# Patient Record
Sex: Male | Born: 1965 | Race: White | Hispanic: No | Smoking: Current every day smoker
Health system: Southern US, Community
[De-identification: ages and names within clinical notes are randomized; demographics above are authoritative.]

## PROBLEM LIST (undated history)

## (undated) DIAGNOSIS — M869 Osteomyelitis, unspecified: Secondary | ICD-10-CM

## (undated) DIAGNOSIS — K279 Peptic ulcer, site unspecified, unspecified as acute or chronic, without hemorrhage or perforation: Secondary | ICD-10-CM

## (undated) DIAGNOSIS — K269 Duodenal ulcer, unspecified as acute or chronic, without hemorrhage or perforation: Secondary | ICD-10-CM

## (undated) DIAGNOSIS — I739 Peripheral vascular disease, unspecified: Secondary | ICD-10-CM

## (undated) DIAGNOSIS — I5041 Acute combined systolic (congestive) and diastolic (congestive) heart failure: Secondary | ICD-10-CM

## (undated) DIAGNOSIS — K259 Gastric ulcer, unspecified as acute or chronic, without hemorrhage or perforation: Secondary | ICD-10-CM

## (undated) DIAGNOSIS — J449 Chronic obstructive pulmonary disease, unspecified: Secondary | ICD-10-CM

## (undated) DIAGNOSIS — I951 Orthostatic hypotension: Secondary | ICD-10-CM

## (undated) DIAGNOSIS — E119 Type 2 diabetes mellitus without complications: Secondary | ICD-10-CM

## (undated) DIAGNOSIS — I10 Essential (primary) hypertension: Secondary | ICD-10-CM

## (undated) DIAGNOSIS — I509 Heart failure, unspecified: Secondary | ICD-10-CM

## (undated) DIAGNOSIS — N183 Chronic kidney disease, stage 3 unspecified: Secondary | ICD-10-CM

## (undated) DIAGNOSIS — Z720 Tobacco use: Secondary | ICD-10-CM

## (undated) DIAGNOSIS — E785 Hyperlipidemia, unspecified: Secondary | ICD-10-CM

## (undated) DIAGNOSIS — D649 Anemia, unspecified: Secondary | ICD-10-CM

## (undated) HISTORY — DX: Chronic kidney disease, stage 3 unspecified: N18.30

## (undated) HISTORY — DX: Acute combined systolic (congestive) and diastolic (congestive) heart failure: I50.41

## (undated) HISTORY — DX: Peptic ulcer, site unspecified, unspecified as acute or chronic, without hemorrhage or perforation: K27.9

## (undated) HISTORY — DX: Heart failure, unspecified: I50.9

## (undated) HISTORY — DX: Duodenal ulcer, unspecified as acute or chronic, without hemorrhage or perforation: K26.9

## (undated) HISTORY — DX: Orthostatic hypotension: I95.1

## (undated) HISTORY — DX: Anemia, unspecified: D64.9

## (undated) HISTORY — DX: Peripheral vascular disease, unspecified: I73.9

## (undated) HISTORY — DX: Gastric ulcer, unspecified as acute or chronic, without hemorrhage or perforation: K25.9

## (undated) HISTORY — DX: Osteomyelitis, unspecified: M86.9

---

## 2016-12-05 DIAGNOSIS — Z720 Tobacco use: Secondary | ICD-10-CM

## 2016-12-05 DIAGNOSIS — E1165 Type 2 diabetes mellitus with hyperglycemia: Secondary | ICD-10-CM

## 2016-12-05 DIAGNOSIS — N19 Unspecified kidney failure: Secondary | ICD-10-CM

## 2016-12-05 DIAGNOSIS — R55 Syncope and collapse: Secondary | ICD-10-CM

## 2016-12-05 DIAGNOSIS — I959 Hypotension, unspecified: Secondary | ICD-10-CM

## 2016-12-06 DIAGNOSIS — I10 Essential (primary) hypertension: Secondary | ICD-10-CM

## 2016-12-06 DIAGNOSIS — N179 Acute kidney failure, unspecified: Secondary | ICD-10-CM

## 2016-12-14 DIAGNOSIS — D649 Anemia, unspecified: Secondary | ICD-10-CM | POA: Insufficient documentation

## 2016-12-20 DIAGNOSIS — R55 Syncope and collapse: Secondary | ICD-10-CM | POA: Insufficient documentation

## 2016-12-20 DIAGNOSIS — I951 Orthostatic hypotension: Secondary | ICD-10-CM | POA: Insufficient documentation

## 2016-12-20 DIAGNOSIS — R079 Chest pain, unspecified: Secondary | ICD-10-CM | POA: Insufficient documentation

## 2016-12-20 HISTORY — DX: Orthostatic hypotension: I95.1

## 2017-05-17 DIAGNOSIS — N529 Male erectile dysfunction, unspecified: Secondary | ICD-10-CM | POA: Insufficient documentation

## 2018-03-18 DIAGNOSIS — F17200 Nicotine dependence, unspecified, uncomplicated: Secondary | ICD-10-CM | POA: Insufficient documentation

## 2018-06-24 DIAGNOSIS — E1165 Type 2 diabetes mellitus with hyperglycemia: Secondary | ICD-10-CM

## 2018-06-24 DIAGNOSIS — Z720 Tobacco use: Secondary | ICD-10-CM

## 2018-06-24 DIAGNOSIS — R739 Hyperglycemia, unspecified: Secondary | ICD-10-CM

## 2018-06-24 DIAGNOSIS — N492 Inflammatory disorders of scrotum: Secondary | ICD-10-CM

## 2018-06-24 DIAGNOSIS — K651 Peritoneal abscess: Secondary | ICD-10-CM

## 2018-07-09 DIAGNOSIS — E1165 Type 2 diabetes mellitus with hyperglycemia: Secondary | ICD-10-CM | POA: Insufficient documentation

## 2018-07-17 DIAGNOSIS — L02215 Cutaneous abscess of perineum: Secondary | ICD-10-CM | POA: Insufficient documentation

## 2020-05-22 DIAGNOSIS — N289 Disorder of kidney and ureter, unspecified: Secondary | ICD-10-CM | POA: Insufficient documentation

## 2020-05-22 DIAGNOSIS — F17210 Nicotine dependence, cigarettes, uncomplicated: Secondary | ICD-10-CM | POA: Insufficient documentation

## 2020-05-22 DIAGNOSIS — Z951 Presence of aortocoronary bypass graft: Secondary | ICD-10-CM | POA: Insufficient documentation

## 2020-05-22 DIAGNOSIS — Z9181 History of falling: Secondary | ICD-10-CM | POA: Insufficient documentation

## 2020-05-22 DIAGNOSIS — Z7984 Long term (current) use of oral hypoglycemic drugs: Secondary | ICD-10-CM | POA: Insufficient documentation

## 2021-06-04 DIAGNOSIS — E785 Hyperlipidemia, unspecified: Secondary | ICD-10-CM | POA: Diagnosis not present

## 2021-06-04 DIAGNOSIS — I340 Nonrheumatic mitral (valve) insufficiency: Secondary | ICD-10-CM | POA: Diagnosis not present

## 2021-06-04 DIAGNOSIS — I5021 Acute systolic (congestive) heart failure: Secondary | ICD-10-CM | POA: Diagnosis not present

## 2021-06-04 DIAGNOSIS — I119 Hypertensive heart disease without heart failure: Secondary | ICD-10-CM | POA: Diagnosis not present

## 2021-06-04 DIAGNOSIS — E1165 Type 2 diabetes mellitus with hyperglycemia: Secondary | ICD-10-CM

## 2021-06-04 DIAGNOSIS — I420 Dilated cardiomyopathy: Secondary | ICD-10-CM | POA: Diagnosis not present

## 2021-06-04 DIAGNOSIS — I361 Nonrheumatic tricuspid (valve) insufficiency: Secondary | ICD-10-CM | POA: Diagnosis not present

## 2021-06-05 DIAGNOSIS — I5021 Acute systolic (congestive) heart failure: Secondary | ICD-10-CM | POA: Diagnosis not present

## 2021-06-05 DIAGNOSIS — I119 Hypertensive heart disease without heart failure: Secondary | ICD-10-CM | POA: Diagnosis not present

## 2021-06-05 DIAGNOSIS — E785 Hyperlipidemia, unspecified: Secondary | ICD-10-CM | POA: Diagnosis not present

## 2021-06-05 DIAGNOSIS — I420 Dilated cardiomyopathy: Secondary | ICD-10-CM | POA: Diagnosis not present

## 2021-06-06 ENCOUNTER — Encounter (HOSPITAL_COMMUNITY): Payer: Self-pay | Admitting: Internal Medicine

## 2021-06-06 ENCOUNTER — Inpatient Hospital Stay (HOSPITAL_COMMUNITY): Payer: BC Managed Care – PPO

## 2021-06-06 ENCOUNTER — Inpatient Hospital Stay (HOSPITAL_COMMUNITY)
Admit: 2021-06-06 | Discharge: 2021-06-18 | DRG: 233 | Disposition: A | Discharge status: Home / Self Care | Payer: BC Managed Care – PPO | Source: Other Acute Inpatient Hospital | Attending: Cardiothoracic Surgery | Admitting: Cardiothoracic Surgery

## 2021-06-06 ENCOUNTER — Encounter (HOSPITAL_COMMUNITY)
Disposition: A | Discharge status: Home / Self Care | Payer: Self-pay | Source: Other Acute Inpatient Hospital | Attending: Internal Medicine

## 2021-06-06 DIAGNOSIS — Z01810 Encounter for preprocedural cardiovascular examination: Secondary | ICD-10-CM | POA: Diagnosis not present

## 2021-06-06 DIAGNOSIS — I10 Essential (primary) hypertension: Secondary | ICD-10-CM

## 2021-06-06 DIAGNOSIS — Z794 Long term (current) use of insulin: Secondary | ICD-10-CM | POA: Diagnosis not present

## 2021-06-06 DIAGNOSIS — D689 Coagulation defect, unspecified: Secondary | ICD-10-CM | POA: Diagnosis not present

## 2021-06-06 DIAGNOSIS — K5900 Constipation, unspecified: Secondary | ICD-10-CM | POA: Diagnosis not present

## 2021-06-06 DIAGNOSIS — I5021 Acute systolic (congestive) heart failure: Principal | ICD-10-CM | POA: Diagnosis present

## 2021-06-06 DIAGNOSIS — Z9889 Other specified postprocedural states: Secondary | ICD-10-CM

## 2021-06-06 DIAGNOSIS — I420 Dilated cardiomyopathy: Secondary | ICD-10-CM | POA: Diagnosis not present

## 2021-06-06 DIAGNOSIS — I493 Ventricular premature depolarization: Secondary | ICD-10-CM | POA: Diagnosis not present

## 2021-06-06 DIAGNOSIS — I2510 Atherosclerotic heart disease of native coronary artery without angina pectoris: Principal | ICD-10-CM | POA: Diagnosis present

## 2021-06-06 DIAGNOSIS — N179 Acute kidney failure, unspecified: Secondary | ICD-10-CM | POA: Diagnosis not present

## 2021-06-06 DIAGNOSIS — E785 Hyperlipidemia, unspecified: Secondary | ICD-10-CM | POA: Diagnosis not present

## 2021-06-06 DIAGNOSIS — Z419 Encounter for procedure for purposes other than remedying health state, unspecified: Secondary | ICD-10-CM

## 2021-06-06 DIAGNOSIS — I110 Hypertensive heart disease with heart failure: Secondary | ICD-10-CM | POA: Diagnosis present

## 2021-06-06 DIAGNOSIS — Z8249 Family history of ischemic heart disease and other diseases of the circulatory system: Secondary | ICD-10-CM | POA: Diagnosis not present

## 2021-06-06 DIAGNOSIS — I255 Ischemic cardiomyopathy: Secondary | ICD-10-CM | POA: Diagnosis present

## 2021-06-06 DIAGNOSIS — D62 Acute posthemorrhagic anemia: Secondary | ICD-10-CM | POA: Diagnosis not present

## 2021-06-06 DIAGNOSIS — E1165 Type 2 diabetes mellitus with hyperglycemia: Secondary | ICD-10-CM | POA: Diagnosis not present

## 2021-06-06 DIAGNOSIS — E871 Hypo-osmolality and hyponatremia: Secondary | ICD-10-CM | POA: Diagnosis not present

## 2021-06-06 DIAGNOSIS — J449 Chronic obstructive pulmonary disease, unspecified: Secondary | ICD-10-CM | POA: Diagnosis present

## 2021-06-06 DIAGNOSIS — Z20822 Contact with and (suspected) exposure to covid-19: Secondary | ICD-10-CM | POA: Diagnosis not present

## 2021-06-06 DIAGNOSIS — Z951 Presence of aortocoronary bypass graft: Secondary | ICD-10-CM

## 2021-06-06 DIAGNOSIS — J9811 Atelectasis: Secondary | ICD-10-CM | POA: Diagnosis not present

## 2021-06-06 DIAGNOSIS — Z79899 Other long term (current) drug therapy: Secondary | ICD-10-CM

## 2021-06-06 DIAGNOSIS — D6959 Other secondary thrombocytopenia: Secondary | ICD-10-CM | POA: Diagnosis not present

## 2021-06-06 DIAGNOSIS — I119 Hypertensive heart disease without heart failure: Secondary | ICD-10-CM | POA: Diagnosis not present

## 2021-06-06 DIAGNOSIS — E119 Type 2 diabetes mellitus without complications: Secondary | ICD-10-CM | POA: Diagnosis not present

## 2021-06-06 DIAGNOSIS — F17210 Nicotine dependence, cigarettes, uncomplicated: Secondary | ICD-10-CM | POA: Diagnosis present

## 2021-06-06 DIAGNOSIS — I509 Heart failure, unspecified: Secondary | ICD-10-CM

## 2021-06-06 DIAGNOSIS — Z833 Family history of diabetes mellitus: Secondary | ICD-10-CM | POA: Diagnosis not present

## 2021-06-06 DIAGNOSIS — K869 Disease of pancreas, unspecified: Secondary | ICD-10-CM | POA: Diagnosis present

## 2021-06-06 HISTORY — DX: Tobacco use: Z72.0

## 2021-06-06 HISTORY — DX: Chronic obstructive pulmonary disease, unspecified: J44.9

## 2021-06-06 HISTORY — DX: Hyperlipidemia, unspecified: E78.5

## 2021-06-06 HISTORY — PX: RIGHT/LEFT HEART CATH AND CORONARY ANGIOGRAPHY: CATH118266

## 2021-06-06 HISTORY — DX: Type 2 diabetes mellitus without complications: E11.9

## 2021-06-06 HISTORY — DX: Acute systolic (congestive) heart failure: I50.21

## 2021-06-06 HISTORY — DX: Essential (primary) hypertension: I10

## 2021-06-06 LAB — ECHOCARDIOGRAM COMPLETE
AR max vel: 1.43 cm2
AV Area VTI: 1.51 cm2
AV Area mean vel: 1.4 cm2
AV Mean grad: 4 mmHg
AV Peak grad: 7.6 mmHg
Ao pk vel: 1.38 m/s
Area-P 1/2: 3.95 cm2
Calc EF: 36.3 %
Height: 68 in
S' Lateral: 4.7 cm
Single Plane A2C EF: 46 %
Single Plane A4C EF: 21.8 %
Weight: 2747.81 oz

## 2021-06-06 LAB — CBC WITH DIFFERENTIAL/PLATELET
Abs Immature Granulocytes: 0.03 10*3/uL (ref 0.00–0.07)
Basophils Absolute: 0.1 10*3/uL (ref 0.0–0.1)
Basophils Relative: 1 %
Eosinophils Absolute: 0.1 10*3/uL (ref 0.0–0.5)
Eosinophils Relative: 2 %
HCT: 47.1 % (ref 39.0–52.0)
Hemoglobin: 15.7 g/dL (ref 13.0–17.0)
Immature Granulocytes: 0 %
Lymphocytes Relative: 22 %
Lymphs Abs: 1.9 10*3/uL (ref 0.7–4.0)
MCH: 28.3 pg (ref 26.0–34.0)
MCHC: 33.3 g/dL (ref 30.0–36.0)
MCV: 85 fL (ref 80.0–100.0)
Monocytes Absolute: 0.7 10*3/uL (ref 0.1–1.0)
Monocytes Relative: 8 %
Neutro Abs: 5.7 10*3/uL (ref 1.7–7.7)
Neutrophils Relative %: 67 %
Platelets: 341 10*3/uL (ref 150–400)
RBC: 5.54 MIL/uL (ref 4.22–5.81)
RDW: 13.2 % (ref 11.5–15.5)
WBC: 8.4 10*3/uL (ref 4.0–10.5)
nRBC: 0 % (ref 0.0–0.2)

## 2021-06-06 LAB — POCT I-STAT 7, (LYTES, BLD GAS, ICA,H+H)
Acid-Base Excess: 3 mmol/L — ABNORMAL HIGH (ref 0.0–2.0)
Bicarbonate: 28 mmol/L (ref 20.0–28.0)
Calcium, Ion: 1.17 mmol/L (ref 1.15–1.40)
HCT: 43 % (ref 39.0–52.0)
Hemoglobin: 14.6 g/dL (ref 13.0–17.0)
O2 Saturation: 97 %
Potassium: 4 mmol/L (ref 3.5–5.1)
Sodium: 139 mmol/L (ref 135–145)
TCO2: 29 mmol/L (ref 22–32)
pCO2 arterial: 45.1 mmHg (ref 32.0–48.0)
pH, Arterial: 7.401 (ref 7.350–7.450)
pO2, Arterial: 95 mmHg (ref 83.0–108.0)

## 2021-06-06 LAB — COMPREHENSIVE METABOLIC PANEL
ALT: 23 U/L (ref 0–44)
AST: 18 U/L (ref 15–41)
Albumin: 3.7 g/dL (ref 3.5–5.0)
Alkaline Phosphatase: 96 U/L (ref 38–126)
Anion gap: 12 (ref 5–15)
BUN: 35 mg/dL — ABNORMAL HIGH (ref 6–20)
CO2: 28 mmol/L (ref 22–32)
Calcium: 10 mg/dL (ref 8.9–10.3)
Chloride: 99 mmol/L (ref 98–111)
Creatinine, Ser: 1.44 mg/dL — ABNORMAL HIGH (ref 0.61–1.24)
GFR, Estimated: 58 mL/min — ABNORMAL LOW (ref >60)
Glucose, Bld: 169 mg/dL — ABNORMAL HIGH (ref 70–99)
Potassium: 4.4 mmol/L (ref 3.5–5.1)
Sodium: 139 mmol/L (ref 135–145)
Total Bilirubin: 0.9 mg/dL (ref 0.3–1.2)
Total Protein: 6.9 g/dL (ref 6.5–8.1)

## 2021-06-06 LAB — POCT I-STAT EG7
Acid-Base Excess: 2 mmol/L (ref 0.0–2.0)
Acid-Base Excess: 3 mmol/L — ABNORMAL HIGH (ref 0.0–2.0)
Acid-Base Excess: 3 mmol/L — ABNORMAL HIGH (ref 0.0–2.0)
Bicarbonate: 28.9 mmol/L — ABNORMAL HIGH (ref 20.0–28.0)
Bicarbonate: 29.2 mmol/L — ABNORMAL HIGH (ref 20.0–28.0)
Bicarbonate: 29.3 mmol/L — ABNORMAL HIGH (ref 20.0–28.0)
Calcium, Ion: 1.19 mmol/L (ref 1.15–1.40)
Calcium, Ion: 1.2 mmol/L (ref 1.15–1.40)
Calcium, Ion: 1.22 mmol/L (ref 1.15–1.40)
HCT: 43 % (ref 39.0–52.0)
HCT: 43 % (ref 39.0–52.0)
HCT: 44 % (ref 39.0–52.0)
Hemoglobin: 14.6 g/dL (ref 13.0–17.0)
Hemoglobin: 14.6 g/dL (ref 13.0–17.0)
Hemoglobin: 15 g/dL (ref 13.0–17.0)
O2 Saturation: 69 %
O2 Saturation: 69 %
O2 Saturation: 71 %
Potassium: 4 mmol/L (ref 3.5–5.1)
Potassium: 4.2 mmol/L (ref 3.5–5.1)
Potassium: 4.2 mmol/L (ref 3.5–5.1)
Sodium: 137 mmol/L (ref 135–145)
Sodium: 138 mmol/L (ref 135–145)
Sodium: 138 mmol/L (ref 135–145)
TCO2: 30 mmol/L (ref 22–32)
TCO2: 31 mmol/L (ref 22–32)
TCO2: 31 mmol/L (ref 22–32)
pCO2, Ven: 50.9 mmHg (ref 44.0–60.0)
pCO2, Ven: 51 mmHg (ref 44.0–60.0)
pCO2, Ven: 51.3 mmHg (ref 44.0–60.0)
pH, Ven: 7.361 (ref 7.250–7.430)
pH, Ven: 7.366 (ref 7.250–7.430)
pH, Ven: 7.366 (ref 7.250–7.430)
pO2, Ven: 38 mmHg (ref 32.0–45.0)
pO2, Ven: 38 mmHg (ref 32.0–45.0)
pO2, Ven: 39 mmHg (ref 32.0–45.0)

## 2021-06-06 LAB — RESP PANEL BY RT-PCR (FLU A&B, COVID) ARPGX2
Influenza A by PCR: NEGATIVE
Influenza B by PCR: NEGATIVE
SARS Coronavirus 2 by RT PCR: NEGATIVE

## 2021-06-06 LAB — GLUCOSE, CAPILLARY: Glucose-Capillary: 177 mg/dL — ABNORMAL HIGH (ref 70–99)

## 2021-06-06 LAB — SURGICAL PCR SCREEN
MRSA, PCR: NEGATIVE
Staphylococcus aureus: POSITIVE — AB

## 2021-06-06 LAB — MAGNESIUM: Magnesium: 2.2 mg/dL (ref 1.7–2.4)

## 2021-06-06 LAB — HIV ANTIBODY (ROUTINE TESTING W REFLEX): HIV Screen 4th Generation wRfx: NONREACTIVE

## 2021-06-06 SURGERY — RIGHT/LEFT HEART CATH AND CORONARY ANGIOGRAPHY
Anesthesia: LOCAL

## 2021-06-06 MED ORDER — FENTANYL CITRATE (PF) 100 MCG/2ML IJ SOLN
INTRAMUSCULAR | Status: DC | PRN
  Administered 2021-06-06: 25 ug via INTRAVENOUS

## 2021-06-06 MED ORDER — VERAPAMIL HCL 2.5 MG/ML IV SOLN
INTRAVENOUS | Status: DC | PRN
  Administered 2021-06-06: 10 mL via INTRA_ARTERIAL

## 2021-06-06 MED ORDER — HEPARIN (PORCINE) IN NACL 1000-0.9 UT/500ML-% IV SOLN
INTRAVENOUS | Status: AC
  Filled 2021-06-06: qty 500

## 2021-06-06 MED ORDER — SODIUM CHLORIDE 0.9% FLUSH
3.0000 mL | Freq: Two times a day (BID) | INTRAVENOUS | Status: DC
  Administered 2021-06-06: 3 mL via INTRAVENOUS

## 2021-06-06 MED ORDER — HEPARIN SODIUM (PORCINE) 1000 UNIT/ML IJ SOLN
INTRAMUSCULAR | Status: DC | PRN
  Administered 2021-06-06: 2000 [IU] via INTRAVENOUS

## 2021-06-06 MED ORDER — ONDANSETRON HCL 4 MG/2ML IJ SOLN: 4.0000 mg | Freq: Four times a day (QID) | INTRAMUSCULAR | Status: DC | PRN

## 2021-06-06 MED ORDER — ACETAMINOPHEN 325 MG PO TABS
650.0000 mg | ORAL_TABLET | ORAL | Status: DC | PRN
  Administered 2021-06-06 – 2021-06-09 (×5): 650 mg via ORAL
  Filled 2021-06-06 (×5): qty 2

## 2021-06-06 MED ORDER — FENTANYL CITRATE (PF) 100 MCG/2ML IJ SOLN
INTRAMUSCULAR | Status: AC
  Filled 2021-06-06: qty 2

## 2021-06-06 MED ORDER — LIDOCAINE HCL (PF) 1 % IJ SOLN
INTRAMUSCULAR | Status: AC
  Filled 2021-06-06: qty 30

## 2021-06-06 MED ORDER — MIDAZOLAM HCL 2 MG/2ML IJ SOLN
INTRAMUSCULAR | Status: DC | PRN
  Administered 2021-06-06: 1 mg via INTRAVENOUS

## 2021-06-06 MED ORDER — HYDRALAZINE HCL 20 MG/ML IJ SOLN: 10.0000 mg | INTRAMUSCULAR | Status: AC | PRN

## 2021-06-06 MED ORDER — SODIUM CHLORIDE 0.9% FLUSH: 3.0000 mL | INTRAVENOUS | Status: DC | PRN

## 2021-06-06 MED ORDER — ENOXAPARIN SODIUM 40 MG/0.4ML IJ SOSY
40.0000 mg | PREFILLED_SYRINGE | INTRAMUSCULAR | Status: DC
  Administered 2021-06-07 – 2021-06-09 (×3): 40 mg via SUBCUTANEOUS
  Filled 2021-06-06 (×3): qty 0.4

## 2021-06-06 MED ORDER — SODIUM CHLORIDE 0.9 % IV SOLN: INTRAVENOUS | Status: AC

## 2021-06-06 MED ORDER — ASPIRIN 81 MG PO CHEW: 81.0000 mg | CHEWABLE_TABLET | ORAL | Status: DC

## 2021-06-06 MED ORDER — SODIUM CHLORIDE 0.9 % IV SOLN: 250.0000 mL | INTRAVENOUS | Status: DC | PRN

## 2021-06-06 MED ORDER — MIDAZOLAM HCL 5 MG/5ML IJ SOLN
INTRAMUSCULAR | Status: AC
  Filled 2021-06-06: qty 5

## 2021-06-06 MED ORDER — ASPIRIN EC 81 MG PO TBEC
81.0000 mg | DELAYED_RELEASE_TABLET | Freq: Every day | ORAL | Status: DC
  Administered 2021-06-07 – 2021-06-09 (×3): 81 mg via ORAL
  Filled 2021-06-06 (×3): qty 1

## 2021-06-06 MED ORDER — SODIUM CHLORIDE 0.9% FLUSH
3.0000 mL | Freq: Two times a day (BID) | INTRAVENOUS | Status: DC
  Administered 2021-06-06 – 2021-06-09 (×7): 3 mL via INTRAVENOUS

## 2021-06-06 MED ORDER — ACETAMINOPHEN 325 MG PO TABS
650.0000 mg | ORAL_TABLET | ORAL | Status: DC | PRN
  Filled 2021-06-06: qty 2

## 2021-06-06 MED ORDER — LABETALOL HCL 5 MG/ML IV SOLN: 10.0000 mg | INTRAVENOUS | Status: AC | PRN

## 2021-06-06 MED ORDER — SODIUM CHLORIDE 0.9 % IV SOLN: INTRAVENOUS | Status: DC

## 2021-06-06 MED ORDER — IOHEXOL 350 MG/ML SOLN
INTRAVENOUS | Status: DC | PRN
  Administered 2021-06-06: 60 mL

## 2021-06-06 MED ORDER — HEPARIN (PORCINE) IN NACL 1000-0.9 UT/500ML-% IV SOLN
INTRAVENOUS | Status: DC | PRN
  Administered 2021-06-06 (×2): 500 mL

## 2021-06-06 MED ORDER — ATORVASTATIN CALCIUM 80 MG PO TABS
80.0000 mg | ORAL_TABLET | Freq: Every day | ORAL | Status: DC
  Administered 2021-06-06 – 2021-06-17 (×12): 80 mg via ORAL
  Filled 2021-06-06 (×12): qty 1

## 2021-06-06 MED ORDER — SODIUM CHLORIDE 0.9% FLUSH
3.0000 mL | Freq: Two times a day (BID) | INTRAVENOUS | Status: DC
  Administered 2021-06-06 – 2021-06-09 (×4): 3 mL via INTRAVENOUS

## 2021-06-06 MED ORDER — LIDOCAINE HCL (PF) 1 % IJ SOLN
INTRAMUSCULAR | Status: DC | PRN
  Administered 2021-06-06 (×2): 2 mL

## 2021-06-06 MED ORDER — VERAPAMIL HCL 2.5 MG/ML IV SOLN
INTRAVENOUS | Status: AC
  Filled 2021-06-06: qty 2

## 2021-06-06 MED ORDER — MELATONIN 3 MG PO TABS
3.0000 mg | ORAL_TABLET | Freq: Every day | ORAL | Status: DC
  Administered 2021-06-06: 3 mg via ORAL
  Filled 2021-06-06: qty 1

## 2021-06-06 SURGICAL SUPPLY — 11 items
CATH BALLN WEDGE 5F 110CM (CATHETERS) ×1 IMPLANT
CATH INFINITI 5 FR JL3.5 (CATHETERS) ×1 IMPLANT
CATH INFINITI 5FR MULTPACK ANG (CATHETERS) ×1 IMPLANT
GUIDEWIRE INQWIRE 1.5J.035X260 (WIRE) IMPLANT
INQWIRE 1.5J .035X260CM (WIRE) ×2
KIT MICROPUNCTURE NIT STIFF (SHEATH) ×1 IMPLANT
PACK CARDIAC CATHETERIZATION (CUSTOM PROCEDURE TRAY) ×2 IMPLANT
SHEATH GLIDE SLENDER 4/5FR (SHEATH) ×2 IMPLANT
SHEATH PINNACLE 6F 10CM (SHEATH) ×1 IMPLANT
SHEATH PROBE COVER 6X72 (BAG) ×1 IMPLANT
TRANSDUCER W/STOPCOCK (MISCELLANEOUS) ×2 IMPLANT

## 2021-06-06 NOTE — Interval H&P Note (Signed)
History and Physical Interval Note:  06/06/2021 12:03 PM  Kenneth Bailey  has presented today for surgery, with the diagnosis of heart failure.  The various methods of treatment have been discussed with the patient and family. After consideration of risks, benefits and other options for treatment, the patient has consented to  Procedure(s): RIGHT/LEFT HEART CATH AND CORONARY ANGIOGRAPHY (N/A) and possible coronary angioplasty as a surgical intervention.  The patient's history has been reviewed, patient examined, no change in status, stable for surgery.  I have reviewed the patient's chart and labs.  Questions were answered to the patient's satisfaction.     [name]

## 2021-06-06 NOTE — Progress Notes (Signed)
7fr sheath aspirated and removed from right brachial vein. Manual pressure applied for 5 minutes. 54fr sheath aspirated and removed from right brachial artery. Manual pressure held over both sites for 25 minutes. Site level 0 , no s+s of hematoma. Tegderm dressing applied, bedrest instructions given.  Sp02 95% as measured in right thumb. Right radial artery easily palpable.   Bedrest begins at 13:50:00

## 2021-06-06 NOTE — Progress Notes (Signed)
°  Echocardiogram 2D Echocardiogram has been performed.  Kenneth Bailey 06/06/2021, 4:33 PM

## 2021-06-06 NOTE — Consult Note (Addendum)
[address]       [place] [address]             [phone]        Kenneth Bailey Medical Record #[id number] Date of Birth: 1965-10-16  Referring: No ref. provider found Primary Care: Marguerita Merles, MD Primary Cardiologist:None  Chief Complaint:   Acute systolic heart failure  History of Present Illness:    We are asked to see this 56 year old male in cardiothoracic surgical consultation for consideration of coronary artery surgical revascularization.  The patient has a recent history of approximately 2 weeks of progressive exertional dyspnea, orthopnea, PND as well as 15 pound weight gain.  He originally had an event where he developed significant chest pain that was both prolonged and somewhat intermittent.  He kept working through the discomfort and it gradually dissipated.  He presented to [facility] last Saturday and was medically stabilized. He was transferred to [facility] for further evaluation including cardiac cath. .  He was noted to have a significantly elevated BNP of 4100 with negative high-sensitivity troponins on initial presentation to [facility].Marland Kitchen  COVID and flu test were negative.  EKG was unremarkable and showed normal sinus rhythm.  Chest CT was negative for PE but did show coronary artery calcification and slightly enlarged mediastinal lymph nodes.  It also showed small to moderate bilateral pleural effusions and incidental finding of a 2.2 x 0.8 cm h hyper vascular structure in the body of the pancreas.  This will require further evaluation as it could be a neoplastic process.  An echocardiogram done at [facility] showed LVEF 25 to 30% with global hypokinesis, RV was mildly reduced.  He has multiple known cardiac risk factors including insulin-dependent diabetes, hypertension, hyperlipidemia as well as tobacco abuse/COPD.  Cardiac catheterization was performed on today's date.  He reveals severe three-vessel coronary artery disease.   The left main is also noted to be 50% occluded in the distal portion of the vessel.  A repeat echocardiogram done on today's date shows left ventricular ejection fraction by estimation 25 to 30%.  There is grade 1 diastolic dysfunction.  There is no significant valvular disease.  Please see full reports listed below.    Current Activity/ Functional Status: Patient is independent with mobility/ambulation, transfers, ADL's, IADL's.   Zubrod Score: At the time of surgery this patients most appropriate activity status/level should be described as: []     0    Normal activity, no symptoms [x]     1    Restricted in physical strenuous activity but ambulatory, able to do out light work []     2    Ambulatory and capable of self care, unable to do work activities, up and about                 more than 50%  Of the time                            []     3    Only limited self care, in bed greater than 50% of waking hours []     4    Completely disabled, no self care, confined to bed or chair []     5    Moribund  Past Medical History:  Diagnosis Date   COPD (chronic obstructive pulmonary disease) ([place])    Diabetes mellitus, type 2 ([place])    HLD (hyperlipidemia)  Hypertension    Tobacco abuse     Past Surgical History:  Procedure Laterality Date   RIGHT/LEFT HEART CATH AND CORONARY ANGIOGRAPHY N/A 06/06/2021   Procedure: RIGHT/LEFT HEART CATH AND CORONARY ANGIOGRAPHY;  Surgeon: Jolaine Artist, MD;  Location: [facility];  Service: Cardiovascular;  Laterality: N/A;    Social History   Tobacco Use  Smoking Status Every Day   Types: Cigarettes  Smokeless Tobacco Not on file    Social History   Substance and Sexual Activity  Alcohol Use None     Not on File  Current Facility-Administered Medications  Medication Dose Route Frequency Provider Last Rate Last Admin   0.9 %  sodium chloride infusion  250 mL Intravenous PRN Bensimhon, Shaune Pascal, MD       0.9 %  sodium chloride  infusion   Intravenous Continuous Bensimhon, Shaune Pascal, MD 75 mL/hr at 06/06/21 1450 New Bag at 06/06/21 1450   0.9 %  sodium chloride infusion  250 mL Intravenous PRN Bensimhon, Shaune Pascal, MD       acetaminophen (TYLENOL) tablet 650 mg  650 mg Oral Q4H PRN Bensimhon, Shaune Pascal, MD   650 mg at 06/06/21 1659   [START ON 06/07/2021] aspirin EC tablet 81 mg  81 mg Oral Daily Bensimhon, Shaune Pascal, MD       atorvastatin (LIPITOR) tablet 80 mg  80 mg Oral Q2000 Bensimhon, Shaune Pascal, MD       [START ON 06/07/2021] enoxaparin (LOVENOX) injection 40 mg  40 mg Subcutaneous Q24H Bensimhon, Shaune Pascal, MD       hydrALAZINE (APRESOLINE) injection 10 mg  10 mg Intravenous Q20 Min PRN Bensimhon, Shaune Pascal, MD       labetalol (NORMODYNE) injection 10 mg  10 mg Intravenous Q10 min PRN Bensimhon, Shaune Pascal, MD       ondansetron (ZOFRAN) injection 4 mg  4 mg Intravenous Q6H PRN Bensimhon, Shaune Pascal, MD       sodium chloride flush (NS) 0.9 % injection 3 mL  3 mL Intravenous Q12H Bensimhon, Shaune Pascal, MD       sodium chloride flush (NS) 0.9 % injection 3 mL  3 mL Intravenous PRN Bensimhon, Shaune Pascal, MD       sodium chloride flush (NS) 0.9 % injection 3 mL  3 mL Intravenous Q12H Bensimhon, Shaune Pascal, MD   3 mL at 06/06/21 1704   sodium chloride flush (NS) 0.9 % injection 3 mL  3 mL Intravenous PRN Bensimhon, Shaune Pascal, MD        Medications Prior to Admission  Medication Sig Dispense Refill Last Dose   Coenzyme Q10 (COQ-10 PO) Take 1 capsule by mouth daily.   Past Week   Insulin Glargine (BASAGLAR KWIKPEN) 100 UNIT/ML Inject 12-50 Units into the skin See admin instructions. Per sliding scale   Past Week   metFORMIN (GLUCOPHAGE) 1000 MG tablet Take 1,000 mg by mouth 2 (two) times daily.   Past Week    Family History  Problem Relation Age of Onset   Hypertension Mother    Cancer Mother    Heart disease Father    CARDIAC CATHETERIZATION  Result Date: 06/06/2021   Ost LAD to Prox LAD lesion is 90% stenosed.   Ramus lesion is  90% stenosed.   Prox RCA lesion is 95% stenosed.   Prox RCA to Mid RCA lesion is 80% stenosed.   Dist RCA lesion is 50% stenosed.   RPDA lesion is 90% stenosed.  Mid LM to Dist LM lesion is 50% stenosed.   Ost Cx to Prox Cx lesion is 40% stenosed.   1st Mrg lesion is 99% stenosed.   Mid Cx lesion is 50% stenosed.   2nd Diag lesion is 90% stenosed.   Mid LAD lesion is 70% stenosed.   Dist LAD lesion is 50% stenosed.   The left ventricular ejection fraction is 25-35% by visual estimate. Findings: Ao = 99/62 (84) LV = 107/11 RA =  5 RV = 30/6 PA = 29/9 (19) PCW = 12 Fick cardiac output/index = 4.7/2.5 PVR = 1.5 WU FA sat = 97% PA sat = 69%, 71% SVC sat = 69% Assessment: Severe 3v CAD Ischemic CM EF ~25-30% Well compensated filling pressures Plan/Discussion: Will need CABG evaluation. Glori Bickers, MD 1:11 PM  ECHOCARDIOGRAM COMPLETE  Result Date: 06/06/2021    ECHOCARDIOGRAM REPORT   Patient Name:   Kenneth Bailey [facility] Date of Exam: 06/06/2021 Medical Rec #:  [id number]           Height:       68.0 in Accession #:    [id number]          Weight:       171.7 lb Date of Birth:  31-Aug-1965            BSA:          1.916 m Patient Age:    43 years            BP:           125/89 mmHg Patient Gender: M                   HR:           80 bpm. Exam Location:  Inpatient Procedure: 2D Echo, Cardiac Doppler and Color Doppler Indications:    CHF-acute systolic  History:        Patient has no prior history of Echocardiogram examinations.                 COPD; Risk Factors:Diabetes, Hypertension and Dyslipidemia. DOE.                 Orthopnea.  Sonographer:    [name] Lefort RDCS (AE) Referring Phys: [place]  1. Left ventricular ejection fraction, by estimation, is 25 to 30%. The left ventricle has severely decreased function. The left ventricle demonstrates global hypokinesis. Left ventricular diastolic parameters are consistent with Grade I diastolic dysfunction (impaired relaxation).  2. Right  ventricular systolic function is normal. The right ventricular size is normal.  3. The mitral valve is abnormal. Trivial mitral valve regurgitation.  4. The aortic valve is tricuspid. There is moderate calcification of the aortic valve. Aortic valve regurgitation is not visualized. Aortic valve sclerosis/calcification is present, without any evidence of aortic stenosis. Aortic valve mean gradient measures 4.0 mmHg.  5. The inferior vena cava is normal in size with greater than 50% respiratory variability, suggesting right atrial pressure of 3 mmHg.  6. Cannot exclude a small PFO. Comparison(s): No prior Echocardiogram. FINDINGS  Left Ventricle: Left ventricular ejection fraction, by estimation, is 25 to 30%. The left ventricle has severely decreased function. The left ventricle demonstrates global hypokinesis. The left ventricular internal cavity size was normal in size. There is no left ventricular hypertrophy. Left ventricular diastolic parameters are consistent with Grade I diastolic dysfunction (impaired relaxation). Indeterminate filling pressures. Right Ventricle: The right ventricular size is normal. No  increase in right ventricular wall thickness. Right ventricular systolic function is normal. Left Atrium: Left atrial size was normal in size. Right Atrium: Right atrial size was normal in size. Pericardium: There is no evidence of pericardial effusion. Mitral Valve: The mitral valve is abnormal. There is moderate thickening of the posterior and anterior mitral valve leaflet(s). Trivial mitral valve regurgitation. Tricuspid Valve: The tricuspid valve is grossly normal. Tricuspid valve regurgitation is trivial. Aortic Valve: The aortic valve is tricuspid. There is moderate calcification of the aortic valve. Aortic valve regurgitation is not visualized. Aortic valve sclerosis/calcification is present, without any evidence of aortic stenosis. Aortic valve mean gradient measures 4.0 mmHg. Aortic valve peak  gradient measures 7.6 mmHg. Aortic valve area, by VTI measures 1.51 cm. Pulmonic Valve: The pulmonic valve was normal in structure. Pulmonic valve regurgitation is not visualized. Aorta: The aortic root and ascending aorta are structurally normal, with no evidence of dilitation. Venous: The inferior vena cava is normal in size with greater than 50% respiratory variability, suggesting right atrial pressure of 3 mmHg. IAS/Shunts: Cannot exclude a small PFO.  LEFT VENTRICLE PLAX 2D LVIDd:         5.30 cm      Diastology LVIDs:         4.70 cm      LV e' medial:    3.38 cm/s LV PW:         1.00 cm      LV E/e' medial:  19.1 LV IVS:        0.90 cm      LV e' lateral:   5.78 cm/s LVOT diam:     1.90 cm      LV E/e' lateral: 11.2 LV SV:         35 LV SV Index:   18 LVOT Area:     2.84 cm  LV Volumes (MOD) LV vol d, MOD A2C: 93.9 ml LV vol d, MOD A4C: 120.0 ml LV vol s, MOD A2C: 50.7 ml LV vol s, MOD A4C: 93.8 ml LV SV MOD A2C:     43.2 ml LV SV MOD A4C:     120.0 ml LV SV MOD BP:      39.4 ml RIGHT VENTRICLE             IVC RV Basal diam:  3.00 cm     IVC diam: 1.40 cm RV S prime:     11.70 cm/s TAPSE (M-mode): 1.7 cm LEFT ATRIUM           Index        RIGHT ATRIUM           Index LA diam:      3.40 cm 1.77 cm/m   RA Area:     13.50 cm LA Vol (A2C): 28.3 ml 14.77 ml/m  RA Volume:   34.10 ml  17.80 ml/m LA Vol (A4C): 26.8 ml 13.99 ml/m  AORTIC VALVE AV Area (Vmax):    1.43 cm AV Area (Vmean):   1.40 cm AV Area (VTI):     1.51 cm AV Vmax:           138.00 cm/s AV Vmean:          93.400 cm/s AV VTI:            0.229 m AV Peak Grad:      7.6 mmHg AV Mean Grad:      4.0 mmHg LVOT Vmax:  69.80 cm/s LVOT Vmean:        46.000 cm/s LVOT VTI:          0.122 m LVOT/AV VTI ratio: 0.53  AORTA Ao Root diam: 3.20 cm Ao Asc diam:  3.40 cm MITRAL VALVE MV Area (PHT): 3.95 cm    SHUNTS MV Decel Time: 192 msec    Systemic VTI:  0.12 m MV E velocity: 64.70 cm/s  Systemic Diam: 1.90 cm MV A velocity: 94.50 cm/s MV E/A ratio:   0.68 Lyman Bishop MD Electronically signed by Lyman Bishop MD Signature Date/Time: 06/06/2021/5:03:05 PM    Final      Review of Systems:   Review of Systems  Constitutional:  Positive for malaise/fatigue. Negative for chills, diaphoresis, fever and weight loss.       Weight gain  HENT:  Positive for sinus pain. Negative for congestion, ear discharge, ear pain, hearing loss, nosebleeds, sore throat and tinnitus.   Eyes: Negative.   Respiratory:  Positive for cough and shortness of breath. Negative for hemoptysis, sputum production and stridor.   Cardiovascular:  Positive for chest pain, orthopnea, claudication, leg swelling and PND. Negative for palpitations.  Gastrointestinal:  Positive for heartburn. Negative for abdominal pain, blood in stool, constipation, diarrhea, melena, nausea and vomiting.  Genitourinary: Negative.   Musculoskeletal:  Positive for back pain, joint pain and myalgias. Negative for falls and neck pain.  Skin: Negative.   Neurological:  Positive for tingling, sensory change and headaches. Negative for dizziness, tremors, speech change, focal weakness, seizures, loss of consciousness and weakness.  Endo/Heme/Allergies:  Positive for polydipsia. Negative for environmental allergies. Does not bruise/bleed easily.  Psychiatric/Behavioral:  Positive for memory loss. Negative for depression, hallucinations, substance abuse and suicidal ideas. The patient has insomnia. The patient is not nervous/anxious.        Physical Exam: BP 124/81    Pulse 83    Temp 98.6 F (37 C) (Oral)    Resp 19    Ht 5\' 8"  (1.727 m)    Wt 77.9 kg    SpO2 98%    BMI 26.11 kg/m    General appearance: alert, cooperative, and no distress Head: Normocephalic, without obvious abnormality, atraumatic Neck: no adenopathy, no carotid bruit, no JVD, supple, symmetrical, trachea midline, and thyroid not enlarged, symmetric, no tenderness/mass/nodules Lymph nodes: Cervical, supraclavicular, and axillary  nodes normal. Resp: clear to auscultation bilaterally Back: symmetric, no curvature. ROM normal. No CVA tenderness. Cardio: regular rate and rhythm, S1, S2 normal, no murmur, click, rub or gallop GI: soft, non-tender; bowel sounds normal; no masses,  no organomegaly Extremities: Spider veins, no edema, absent pedal pulses Neurologic: Grossly normal  Diagnostic Studies & Laboratory data:     Recent Radiology Findings:      I have independently reviewed the above radiologic studies and discussed with the patient   Recent Lab Findings: Lab Results  Component Value Date   WBC 8.4 06/06/2021   HGB 15.7 06/06/2021   HCT 47.1 06/06/2021   PLT 341 06/06/2021   GLUCOSE 169 (H) 06/06/2021   ALT 23 06/06/2021   AST 18 06/06/2021   NA 139 06/06/2021   K 4.4 06/06/2021   CL 99 06/06/2021   CREATININE 1.44 (H) 06/06/2021   BUN 35 (H) 06/06/2021   CO2 28 06/06/2021      Assessment / Plan: Severe left main and three-vessel artery disease.  Significant LV dysfunction with ejection fraction reduced to 25 to 30%. COPD with significant tobacco use x48 years.  He began smoking when he was 56 years old.  Typical use would be half pack per day but he has decreased to 5 recently. Diabetes mellitus type 2 currently on insulin and oral medications since approximately 2010. Hyperlipidemia Hypertension Reportedly has a pancreas mass that needs further diagnostic evaluation.    I  spent 40 minutes counseling the patient face to face.   John Giovanni, PA-C  06/06/2021 5:12 PM  Patient examined, images of coronary angiogram and echocardiogram personally reviewed. 56 yo male with active smoking and A1c > 10 presents with severe CAD, low EF and CHF with compensated RHC. He is a candidate for CABG at high risk if the pancreatic lesion is found to be low risk- he denies abd pain other than reflux sx and denies wt loss . Tentatively sched for later this week.  patient examined and medical record  reviewed,agree with above note. Dahlia Byes 06/07/2021

## 2021-06-06 NOTE — H&P (Signed)
Advanced Heart Failure Team History and Physical Note   PCP:  Marguerita Merles, MD  PCP-Cardiology: Dr. Bettina Gavia  Reason for Admission: Acute Systolic Heart Failure   HPI:    56 y/o male w/ IDDM, HTN, HLD and COPD who presented to [facility] w/ 2 week h/o progressive exertional dyspnea, orthopnea, PND and 15 lb wt gain. Found to be in acute CHF. BNP 4100. Hs Trop 0.06>>0.07>>0.7. SCr 1.4, K 3.8, WBC 7, hgb 13. Lactic acid 1.7. EKG NSR and normotensive. COVID and Flu negative. Chest CT negative PE but did show coronary artery calcifications and slightly enlarged mediastinal lymph nodes, small to moderate b/ pleural effusions and incidental finding of a 2.2 x 0.8 cm hypervascular structure in the body of the pancreas (may suggest vascular malformation or neoplastic process, follow-up CT/MRI warranted).   Echo showed severely reduced LVEF 25-30%, mild LVH, severe HK of the LV w/ restrictive LV filling pattern c/w elevated LA pressure, RV mildly reduced, mild MR. Transferred to [facility] for [facility] and AHF consultation.    Review of Systems: [y] = yes, [ ]  = no   General: Weight gain [ Y]; Weight loss [ ] ; Anorexia [ ] ; Fatigue [ ] ; Fever [ ] ; Chills [ ] ; Weakness [ ]   Cardiac: Chest pain/pressure [ ] ; Resting SOB [ Y]; Exertional SOB [ Y]; Orthopnea [ Y]; Pedal Edema [ ] ; Palpitations [ ] ; Syncope [ ] ; Presyncope [ ] ; Paroxysmal nocturnal dyspnea[ Y]  Pulmonary: Cough [ ] ; Wheezing[ ] ; Hemoptysis[ ] ; Sputum [ ] ; Snoring [ ]   GI: Vomiting[ ] ; Dysphagia[ ] ; Melena[ ] ; Hematochezia [ ] ; Heartburn[ ] ; Abdominal pain [ ] ; Constipation [ ] ; Diarrhea [ ] ; BRBPR [ ]   GU: Hematuria[ ] ; Dysuria [ ] ; Nocturia[ ]   Vascular: Pain in legs with walking [ ] ; Pain in feet with lying flat [ ] ; Non-healing sores [ ] ; Stroke [ ] ; TIA [ ] ; Slurred speech [ ] ;  Neuro: Headaches[ ] ; Vertigo[ ] ; Seizures[ ] ; Paresthesias[ ] ;Blurred vision [ ] ; Diplopia [ ] ; Vision changes [ ]   Ortho/Skin: Arthritis [ ] ; Joint pain [  ]; Muscle pain [ ] ; Joint swelling [ ] ; Back Pain [ ] ; Rash [ ]   Psych: Depression[ ] ; Anxiety[ ]   Heme: Bleeding problems [ ] ; Clotting disorders [ ] ; Anemia [ ]   Endocrine: Diabetes [ ] ; Thyroid dysfunction[ ]    Home Medications Prior to Admission medications   Not on File    Past Medical History: Past Medical History:  Diagnosis Date   COPD (chronic obstructive pulmonary disease) (HCC)    Diabetes mellitus, type 2 (HCC)    HLD (hyperlipidemia)    Hypertension    Tobacco abuse     Past Surgical History: History reviewed. No pertinent surgical history.  Family History:  Family History  Problem Relation Age of Onset   Hypertension Mother    Cancer Mother    Heart disease Father     Social History: Social History   Socioeconomic History   Marital status: Divorced    Spouse name: Not on file   Number of children: Not on file   Years of education: Not on file   Highest education level: Not on file  Occupational History   Not on file  Tobacco Use   Smoking status: Every Day    Types: Cigarettes   Smokeless tobacco: Not on file  Substance and Sexual Activity   Alcohol use: Not on file   Drug use: Not on file   Sexual  activity: Not on file  Other Topics Concern   Not on file  Social History Narrative   Not on file   Social Determinants of Health   Financial Resource Strain: Not on file  Food Insecurity: Not on file  Transportation Needs: Not on file  Physical Activity: Not on file  Stress: Not on file  Social Connections: Not on file    Allergies:  Not on File  Objective:    Vital Signs:   SpO2:  [93 %] 93 % (01/16 1134)   There were no vitals filed for this visit.   Physical Exam     General:  Well appearing. No respiratory difficulty HEENT: Normal Neck: Supple. no JVD. Carotids 2+ bilat; no bruits. No lymphadenopathy or thyromegaly appreciated. Cor: PMI nondisplaced. Regular rate & rhythm. No rubs, gallops or murmurs. Lungs:  Clear Abdomen: Soft, nontender, nondistended. No hepatosplenomegaly. No bruits or masses. Good bowel sounds. Extremities: No cyanosis, clubbing, rash, edema Neuro: Alert & oriented x 3, cranial nerves grossly intact. moves all 4 extremities w/o difficulty. Affect pleasant.   Telemetry   NSR 80 Personally reviewed   EKG   NSR 74 LVH narrow QRS. Personally reviewed   Labs     Basic Metabolic Panel: No results for input(s): NA, K, CL, CO2, GLUCOSE, BUN, CREATININE, CALCIUM, MG, PHOS in the last 168 hours.  Liver Function Tests: No results for input(s): AST, ALT, ALKPHOS, BILITOT, PROT, ALBUMIN in the last 168 hours. No results for input(s): LIPASE, AMYLASE in the last 168 hours. No results for input(s): AMMONIA in the last 168 hours.  CBC: No results for input(s): WBC, NEUTROABS, HGB, HCT, MCV, PLT in the last 168 hours.  Cardiac Enzymes: No results for input(s): CKTOTAL, CKMB, CKMBINDEX, TROPONINI in the last 168 hours.  BNP: BNP (last 3 results) No results for input(s): BNP in the last 8760 hours.  ProBNP (last 3 results) No results for input(s): PROBNP in the last 8760 hours.   CBG: No results for input(s): GLUCAP in the last 168 hours.  Coagulation Studies: No results for input(s): LABPROT, INR in the last 72 hours.  Imaging: No results found.   Patient Profile   56 y/o male w/ multiple cardiac risk factors, HTN, HLD, tobacco abuse and T2DM, admitted w/ new systolic heart failure. Echo at [place], EF 20-25%, RV mildly reduced.   Assessment/Plan   Acute Systolic Heart Failure (New) - Echo at [facility]: LVEF 25-30%, global HK. RV mildly reduced  - Hs trop negative x 3. COVID and Flu negative - Plan R/LHC today  - cMRI if cath unrevealing  - GDMT titration as BP/ Renal fx allows  2. Renal Insuffiencey - SCr 1.4 at [place], baseline unknown - follow BMP   3. ? Pancreatic Mass - incidental finding of a 2.2 x 0.8 cm hypervascular structure in the body  of the pancreas noted on CT -may suggest vascular malformation or neoplastic process. Follow-up CT/MRI warranted.  4. Type 2DM - Check Hgb A1c  - would benefit from SGLT2i   5. Hypertension  - controlled - GDMT initiation   6. Tobacco Abuse - smoking cessation advised    Lyda Jester, PA-C 06/06/2021, 11:50 AM  Advanced Heart Failure Team Pager [phone] (M-F; 7a - 5p)  Please contact [facility] for night-coverage after hours (4p -7a ) and weekends on [email or web address]    56 y/o male with tobacco use, DM2, HTN, HL admitted with acute systolic HF.   Troponin and ECG negative. Has been diuresed  with symptomatic improvement.   Transferred for cath.   CT chest showed possible pancreatic malformation vs neoplasm  General:  Well appearing. No resp difficulty HEENT: normal Neck: supple. no JVD. Carotids 2+ bilat; no bruits. No lymphadenopathy or thryomegaly appreciated. Cor: PMI nondisplaced. Regular rate & rhythm. No rubs, gallops or murmurs. Lungs: clear Abdomen: soft, nontender, nondistended. No hepatosplenomegaly. No bruits or masses. Good bowel sounds. Extremities: no cyanosis, clubbing, rash, edema Neuro: alert & orientedx3, cranial nerves grossly intact. moves all 4 extremities w/o difficulty. Affect pleasant  Will plan R/L cath for further evaluation of HF. Titrate meds. Will need further eval of pancrease.   Glori Bickers, MD  12:02 PM

## 2021-06-07 ENCOUNTER — Inpatient Hospital Stay (HOSPITAL_COMMUNITY): Payer: BC Managed Care – PPO

## 2021-06-07 ENCOUNTER — Other Ambulatory Visit (HOSPITAL_COMMUNITY): Payer: Self-pay

## 2021-06-07 DIAGNOSIS — Z01810 Encounter for preprocedural cardiovascular examination: Secondary | ICD-10-CM

## 2021-06-07 DIAGNOSIS — I5021 Acute systolic (congestive) heart failure: Secondary | ICD-10-CM | POA: Diagnosis not present

## 2021-06-07 DIAGNOSIS — I2510 Atherosclerotic heart disease of native coronary artery without angina pectoris: Secondary | ICD-10-CM

## 2021-06-07 LAB — BLOOD GAS, ARTERIAL
Acid-Base Excess: 0 mmol/L (ref 0.0–2.0)
Bicarbonate: 23.9 mmol/L (ref 20.0–28.0)
Drawn by: 36277
FIO2: 21
O2 Saturation: 93.8 %
Patient temperature: 36.6
pCO2 arterial: 36.6 mmHg (ref 32.0–48.0)
pH, Arterial: 7.429 (ref 7.350–7.450)
pO2, Arterial: 69 mmHg — ABNORMAL LOW (ref 83.0–108.0)

## 2021-06-07 LAB — CBC
HCT: 48.5 % (ref 39.0–52.0)
Hemoglobin: 15.7 g/dL (ref 13.0–17.0)
MCH: 27.9 pg (ref 26.0–34.0)
MCHC: 32.4 g/dL (ref 30.0–36.0)
MCV: 86.1 fL (ref 80.0–100.0)
Platelets: 364 10*3/uL (ref 150–400)
RBC: 5.63 MIL/uL (ref 4.22–5.81)
RDW: 13.2 % (ref 11.5–15.5)
WBC: 8.8 10*3/uL (ref 4.0–10.5)
nRBC: 0 % (ref 0.0–0.2)

## 2021-06-07 LAB — LIPID PANEL
Cholesterol: 241 mg/dL — ABNORMAL HIGH (ref 0–200)
HDL: 29 mg/dL — ABNORMAL LOW (ref >40)
LDL Cholesterol: 155 mg/dL — ABNORMAL HIGH (ref 0–99)
Total CHOL/HDL Ratio: 8.3 RATIO
Triglycerides: 284 mg/dL — ABNORMAL HIGH (ref <150)
VLDL: 57 mg/dL — ABNORMAL HIGH (ref 0–40)

## 2021-06-07 LAB — GLUCOSE, CAPILLARY
Glucose-Capillary: 190 mg/dL — ABNORMAL HIGH (ref 70–99)
Glucose-Capillary: 231 mg/dL — ABNORMAL HIGH (ref 70–99)
Glucose-Capillary: 186 mg/dL — ABNORMAL HIGH (ref 70–99)
Glucose-Capillary: 256 mg/dL — ABNORMAL HIGH (ref 70–99)

## 2021-06-07 LAB — BASIC METABOLIC PANEL
Anion gap: 15 (ref 5–15)
BUN: 39 mg/dL — ABNORMAL HIGH (ref 6–20)
CO2: 20 mmol/L — ABNORMAL LOW (ref 22–32)
Calcium: 10.2 mg/dL (ref 8.9–10.3)
Chloride: 100 mmol/L (ref 98–111)
Creatinine, Ser: 1.46 mg/dL — ABNORMAL HIGH (ref 0.61–1.24)
GFR, Estimated: 57 mL/min — ABNORMAL LOW (ref >60)
Glucose, Bld: 167 mg/dL — ABNORMAL HIGH (ref 70–99)
Potassium: 4.4 mmol/L (ref 3.5–5.1)
Sodium: 135 mmol/L (ref 135–145)

## 2021-06-07 LAB — HEMOGLOBIN A1C
Hgb A1c MFr Bld: 10.7 % — ABNORMAL HIGH (ref 4.8–5.6)
Mean Plasma Glucose: 260.39 mg/dL

## 2021-06-07 LAB — TSH: TSH: 2.153 u[IU]/mL (ref 0.350–4.500)

## 2021-06-07 MED ORDER — ALBUTEROL SULFATE (2.5 MG/3ML) 0.083% IN NEBU
2.5000 mg | INHALATION_SOLUTION | Freq: Four times a day (QID) | RESPIRATORY_TRACT | Status: DC
  Administered 2021-06-07: 2.5 mg via RESPIRATORY_TRACT
  Filled 2021-06-07: qty 3

## 2021-06-07 MED ORDER — NICOTINE 14 MG/24HR TD PT24
14.0000 mg | MEDICATED_PATCH | Freq: Every day | TRANSDERMAL | Status: DC
  Administered 2021-06-07 – 2021-06-18 (×11): 14 mg via TRANSDERMAL
  Filled 2021-06-07 (×11): qty 1

## 2021-06-07 MED ORDER — INSULIN ASPART 100 UNIT/ML IJ SOLN
0.0000 [IU] | Freq: Every day | INTRAMUSCULAR | Status: DC
  Administered 2021-06-07: 3 [IU] via SUBCUTANEOUS

## 2021-06-07 MED ORDER — ALBUTEROL SULFATE (2.5 MG/3ML) 0.083% IN NEBU
2.5000 mg | INHALATION_SOLUTION | Freq: Four times a day (QID) | RESPIRATORY_TRACT | Status: DC | PRN
  Administered 2021-06-09: 2.5 mg via RESPIRATORY_TRACT

## 2021-06-07 MED ORDER — MUPIROCIN CALCIUM 2 % EX CREA
TOPICAL_CREAM | Freq: Two times a day (BID) | CUTANEOUS | Status: DC
  Administered 2021-06-08 – 2021-06-15 (×4): 1 via TOPICAL
  Filled 2021-06-07 (×2): qty 15

## 2021-06-07 MED ORDER — INSULIN GLARGINE-YFGN 100 UNIT/ML ~~LOC~~ SOLN
10.0000 [IU] | Freq: Every day | SUBCUTANEOUS | Status: DC
  Administered 2021-06-07: 10 [IU] via SUBCUTANEOUS
  Filled 2021-06-07 (×2): qty 0.1

## 2021-06-07 MED ORDER — MELATONIN 5 MG PO TABS
5.0000 mg | ORAL_TABLET | Freq: Every day | ORAL | Status: DC
  Administered 2021-06-08 – 2021-06-17 (×10): 5 mg via ORAL
  Filled 2021-06-07 (×10): qty 1

## 2021-06-07 MED ORDER — INSULIN ASPART 100 UNIT/ML IJ SOLN
0.0000 [IU] | Freq: Three times a day (TID) | INTRAMUSCULAR | Status: DC
  Administered 2021-06-07: 3 [IU] via SUBCUTANEOUS
  Administered 2021-06-07 – 2021-06-08 (×2): 2 [IU] via SUBCUTANEOUS
  Administered 2021-06-08: 3 [IU] via SUBCUTANEOUS

## 2021-06-07 MED ORDER — SPIRONOLACTONE 12.5 MG HALF TABLET
12.5000 mg | ORAL_TABLET | Freq: Every day | ORAL | Status: DC
  Administered 2021-06-07 – 2021-06-09 (×3): 12.5 mg via ORAL
  Filled 2021-06-07 (×3): qty 1

## 2021-06-07 MED ORDER — MOMETASONE FURO-FORMOTEROL FUM 200-5 MCG/ACT IN AERO
2.0000 | INHALATION_SPRAY | Freq: Two times a day (BID) | RESPIRATORY_TRACT | Status: DC
  Administered 2021-06-07 – 2021-06-18 (×18): 2 via RESPIRATORY_TRACT
  Filled 2021-06-07 (×3): qty 8.8

## 2021-06-07 NOTE — Progress Notes (Signed)
° °  Spoke with Radiology who reviewed CT images.  Pancreas lesion felt to be low-risk. Possibly islet tumor, calcification or splenic artery aneurysm.   Recommend non-urgent abdominal MRI.   Will discuss timing with TCTS.   Glori Bickers, MD  8:45 AM

## 2021-06-07 NOTE — Progress Notes (Signed)
Discussed with pt IS, (2079ml), sternal precautions, mobility post op and d/c planning. Receptive. Able to get out of bed independently following sternal precautions. He has been ambulating, encouraged cessation if sx. [place], ACSM 3:14 PM 06/07/2021

## 2021-06-07 NOTE — Progress Notes (Signed)
Pre-CABG study completed.  ° °Please see CV Proc for preliminary results.  ° °Dshaun Reppucci, RDMS, RVT ° °

## 2021-06-07 NOTE — TOC Benefit Eligibility Note (Signed)
Patient Teacher, English as a foreign language completed.    The patient is currently admitted and upon discharge could be taking Entresto 24-26 mg.  The current 30 day co-pay is, $45.00.   The patient is currently admitted and upon discharge could be taking Jardiance 10 mg.  The current 30 day co-pay is, $45.00.   The patient is currently admitted and upon discharge could be taking Farxiga 10 mg.  The current 30 day co-pay is, $45.00.   The patient is insured through [employer] [place], [place] Patient Advocate Specialist [place] Patient Advocate Team Direct Number: [phone]  Fax: [phone]

## 2021-06-07 NOTE — Progress Notes (Addendum)
Advanced Heart Failure Rounding Note  PCP-Cardiologist: None   Subjective:    R/LHC w/ severe 3V CAD. Echo EF 25-30%. RV normal. Awaiting potential CABG  Chest pain free. Denies dyspnea. NSR on tele.   BMP in process.    RHC hemodynamics Ao = 99/62 (84)  LV = 107/11 RA =  5 RV = 30/6 PA = 29/9 (19) PCW = 12 Fick cardiac output/index = 4.7/2.5 PVR = 1.5 WU FA sat = 97% PA sat = 69%, 71% SVC sat = 69%   Objective:   Weight Range: 75.1 kg Body mass index is 25.16 kg/m.   Vital Signs:   Temp:  [97.8 F (36.6 C)-98.8 F (37.1 C)] 98.8 F (37.1 C) (01/17 0758) Pulse Rate:  [69-88] 88 (01/17 0758) Resp:  [0-25] 18 (01/17 0758) BP: (102-130)/(63-89) 120/76 (01/17 0758) SpO2:  [93 %-99 %] 96 % (01/17 0758) Weight:  [75.1 kg-77.9 kg] 75.1 kg (01/17 0455) Last BM Date: 06/06/20  Weight change: Filed Weights   06/06/21 1400 06/07/21 0455  Weight: 77.9 kg 75.1 kg    Intake/Output:   Intake/Output Summary (Last 24 hours) at 06/07/2021 0908 Last data filed at 06/07/2021 0458 Gross per 24 hour  Intake 709.44 ml  Output 1475 ml  Net -765.56 ml      Physical Exam    General:  Well appearing. No resp difficulty HEENT: Normal Neck: Supple. No JVP . Carotids 2+ bilat; no bruits. No lymphadenopathy or thyromegaly appreciated. Cor: PMI nondisplaced. Regular rate & rhythm. No rubs, gallops or murmurs. Lungs: Clear Abdomen: Soft, nontender, nondistended. No hepatosplenomegaly. No bruits or masses. Good bowel sounds. Extremities: No cyanosis, clubbing, rash, edema Neuro: Alert & orientedx3, cranial nerves grossly intact. moves all 4 extremities w/o difficulty. Affect pleasant   Telemetry   NSR, 80s   EKG    No new EKG to review   Labs    CBC Recent Labs    06/06/21 1428 06/07/21 0423  WBC 8.4 8.8  NEUTROABS 5.7  --   HGB 15.7 15.7  HCT 47.1 48.5  MCV 85.0 86.1  PLT 341 295   Basic Metabolic Panel Recent Labs    06/06/21 1250 06/06/21 1428   NA 138 139  K 4.2 4.4  CL  --  99  CO2  --  28  GLUCOSE  --  169*  BUN  --  35*  CREATININE  --  1.44*  CALCIUM  --  10.0  MG  --  2.2   Liver Function Tests Recent Labs    06/06/21 1428  AST 18  ALT 23  ALKPHOS 96  BILITOT 0.9  PROT 6.9  ALBUMIN 3.7   No results for input(s): LIPASE, AMYLASE in the last 72 hours. Cardiac Enzymes No results for input(s): CKTOTAL, CKMB, CKMBINDEX, TROPONINI in the last 72 hours.  BNP: BNP (last 3 results) No results for input(s): BNP in the last 8760 hours.  ProBNP (last 3 results) No results for input(s): PROBNP in the last 8760 hours.   D-Dimer No results for input(s): DDIMER in the last 72 hours. Hemoglobin A1C Recent Labs    06/07/21 0423  HGBA1C 10.7*   Fasting Lipid Panel Recent Labs    06/07/21 0423  CHOL 241*  HDL 29*  LDLCALC 155*  TRIG 284*  CHOLHDL 8.3   Thyroid Function Tests Recent Labs    06/07/21 0423  TSH 2.153    Other results:   Imaging    CARDIAC CATHETERIZATION  Result  Date: 06/06/2021   Ost LAD to Prox LAD lesion is 90% stenosed.   Ramus lesion is 90% stenosed.   Prox RCA lesion is 95% stenosed.   Prox RCA to Mid RCA lesion is 80% stenosed.   Dist RCA lesion is 50% stenosed.   RPDA lesion is 90% stenosed.   Mid LM to Dist LM lesion is 50% stenosed.   Ost Cx to Prox Cx lesion is 40% stenosed.   1st Mrg lesion is 99% stenosed.   Mid Cx lesion is 50% stenosed.   2nd Diag lesion is 90% stenosed.   Mid LAD lesion is 70% stenosed.   Dist LAD lesion is 50% stenosed.   The left ventricular ejection fraction is 25-35% by visual estimate. Findings: Ao = 99/62 (84) LV = 107/11 RA =  5 RV = 30/6 PA = 29/9 (19) PCW = 12 Fick cardiac output/index = 4.7/2.5 PVR = 1.5 WU FA sat = 97% PA sat = 69%, 71% SVC sat = 69% Assessment: Severe 3v CAD Ischemic CM EF ~25-30% Well compensated filling pressures Plan/Discussion: Will need CABG evaluation. Glori Bickers, MD 1:11 PM  ECHOCARDIOGRAM COMPLETE  Result Date:  06/06/2021    ECHOCARDIOGRAM REPORT   Patient Name:   Kenneth Bailey [facility] Date of Exam: 06/06/2021 Medical Rec #:  [id number]           Height:       68.0 in Accession #:    [id number]          Weight:       171.7 lb Date of Birth:  1965-12-23            BSA:          1.916 m Patient Age:    56 years            BP:           125/89 mmHg Patient Gender: M                   HR:           80 bpm. Exam Location:  Inpatient Procedure: 2D Echo, Cardiac Doppler and Color Doppler Indications:    CHF-acute systolic  History:        Patient has no prior history of Echocardiogram examinations.                 COPD; Risk Factors:Diabetes, Hypertension and Dyslipidemia. DOE.                 Orthopnea.  Sonographer:    [name] Lefort RDCS (AE) Referring Phys: [place]  1. Left ventricular ejection fraction, by estimation, is 25 to 30%. The left ventricle has severely decreased function. The left ventricle demonstrates global hypokinesis. Left ventricular diastolic parameters are consistent with Grade I diastolic dysfunction (impaired relaxation).  2. Right ventricular systolic function is normal. The right ventricular size is normal.  3. The mitral valve is abnormal. Trivial mitral valve regurgitation.  4. The aortic valve is tricuspid. There is moderate calcification of the aortic valve. Aortic valve regurgitation is not visualized. Aortic valve sclerosis/calcification is present, without any evidence of aortic stenosis. Aortic valve mean gradient measures 4.0 mmHg.  5. The inferior vena cava is normal in size with greater than 50% respiratory variability, suggesting right atrial pressure of 3 mmHg.  6. Cannot exclude a small PFO. Comparison(s): No prior Echocardiogram. FINDINGS  Left Ventricle: Left ventricular ejection fraction, by estimation, is 25  to 30%. The left ventricle has severely decreased function. The left ventricle demonstrates global hypokinesis. The left ventricular internal cavity size was  normal in size. There is no left ventricular hypertrophy. Left ventricular diastolic parameters are consistent with Grade I diastolic dysfunction (impaired relaxation). Indeterminate filling pressures. Right Ventricle: The right ventricular size is normal. No increase in right ventricular wall thickness. Right ventricular systolic function is normal. Left Atrium: Left atrial size was normal in size. Right Atrium: Right atrial size was normal in size. Pericardium: There is no evidence of pericardial effusion. Mitral Valve: The mitral valve is abnormal. There is moderate thickening of the posterior and anterior mitral valve leaflet(s). Trivial mitral valve regurgitation. Tricuspid Valve: The tricuspid valve is grossly normal. Tricuspid valve regurgitation is trivial. Aortic Valve: The aortic valve is tricuspid. There is moderate calcification of the aortic valve. Aortic valve regurgitation is not visualized. Aortic valve sclerosis/calcification is present, without any evidence of aortic stenosis. Aortic valve mean gradient measures 4.0 mmHg. Aortic valve peak gradient measures 7.6 mmHg. Aortic valve area, by VTI measures 1.51 cm. Pulmonic Valve: The pulmonic valve was normal in structure. Pulmonic valve regurgitation is not visualized. Aorta: The aortic root and ascending aorta are structurally normal, with no evidence of dilitation. Venous: The inferior vena cava is normal in size with greater than 50% respiratory variability, suggesting right atrial pressure of 3 mmHg. IAS/Shunts: Cannot exclude a small PFO.  LEFT VENTRICLE PLAX 2D LVIDd:         5.30 cm      Diastology LVIDs:         4.70 cm      LV e' medial:    3.38 cm/s LV PW:         1.00 cm      LV E/e' medial:  19.1 LV IVS:        0.90 cm      LV e' lateral:   5.78 cm/s LVOT diam:     1.90 cm      LV E/e' lateral: 11.2 LV SV:         35 LV SV Index:   18 LVOT Area:     2.84 cm  LV Volumes (MOD) LV vol d, MOD A2C: 93.9 ml LV vol d, MOD A4C: 120.0 ml LV vol  s, MOD A2C: 50.7 ml LV vol s, MOD A4C: 93.8 ml LV SV MOD A2C:     43.2 ml LV SV MOD A4C:     120.0 ml LV SV MOD BP:      39.4 ml RIGHT VENTRICLE             IVC RV Basal diam:  3.00 cm     IVC diam: 1.40 cm RV S prime:     11.70 cm/s TAPSE (M-mode): 1.7 cm LEFT ATRIUM           Index        RIGHT ATRIUM           Index LA diam:      3.40 cm 1.77 cm/m   RA Area:     13.50 cm LA Vol (A2C): 28.3 ml 14.77 ml/m  RA Volume:   34.10 ml  17.80 ml/m LA Vol (A4C): 26.8 ml 13.99 ml/m  AORTIC VALVE AV Area (Vmax):    1.43 cm AV Area (Vmean):   1.40 cm AV Area (VTI):     1.51 cm AV Vmax:           138.00  cm/s AV Vmean:          93.400 cm/s AV VTI:            0.229 m AV Peak Grad:      7.6 mmHg AV Mean Grad:      4.0 mmHg LVOT Vmax:         69.80 cm/s LVOT Vmean:        46.000 cm/s LVOT VTI:          0.122 m LVOT/AV VTI ratio: 0.53  AORTA Ao Root diam: 3.20 cm Ao Asc diam:  3.40 cm MITRAL VALVE MV Area (PHT): 3.95 cm    SHUNTS MV Decel Time: 192 msec    Systemic VTI:  0.12 m MV E velocity: 64.70 cm/s  Systemic Diam: 1.90 cm MV A velocity: 94.50 cm/s MV E/A ratio:  0.68 Lyman Bishop MD Electronically signed by Lyman Bishop MD Signature Date/Time: 06/06/2021/5:03:05 PM    Final      Medications:     Scheduled Medications:  albuterol  2.5 mg Nebulization QID   aspirin EC  81 mg Oral Daily   atorvastatin  80 mg Oral Q2000   enoxaparin (LOVENOX) injection  40 mg Subcutaneous Q24H   melatonin  3 mg Oral QHS   mometasone-formoterol  2 puff Inhalation BID   sodium chloride flush  3 mL Intravenous Q12H   sodium chloride flush  3 mL Intravenous Q12H    Infusions:  sodium chloride     sodium chloride      PRN Medications: sodium chloride, sodium chloride, acetaminophen, ondansetron (ZOFRAN) IV, sodium chloride flush, sodium chloride flush    Patient Profile   56 y/o male w/ multiple cardiac risk factors, HTN, HLD, tobacco abuse and T2DM, admitted w/ new systolic heart failure. Echo at [place], EF  20-25%, RV mildly reduced. Transferred to [facility]. R/LHC showed severe 3v CAD, ischemic CM EF ~25-30%, well compensated filling pressures and preserved CO. CT surgery following for potential CABG.     Assessment/Plan   1. Acute Systolic Heart Failure (New)/ Ischemic CM  - Echo at [facility]: LVEF 25-30%, global HK. RV mildly reduced  - Echo [facility] EF 25-30%, RV normal  - Hs trop negative x 3. COVID and Flu negative - LHC w/ severe 3V CAD, awaiting CABG  - RHC w/ well compensated filling pressures and preserved CO - volume ok. Hold lasix today. Check BMP   2. CAD - LHC w/ severe 3V CAD, awaiting CABG - CP free  - ASA 81 mg  - Atorva 80 mg    3. Renal Insuffiencey - SCr 1.4 at [facility], baseline unknown - BMP today pending    4. Pancreatic Lesion - incidental finding of a 2.2 x 0.8 cm hypervascular structure in the body of the pancreas noted on CT [facility] Radiology reviewed CT images. Pancreas lesion felt to be low-risk. Possibly islet tumor, calcification or splenic artery aneurysm.  - Recommend non-urgent abdominal MRI.  4. Type 2DM - Hgb A1c 10.7 - SSI  - would benefit from SGLT2i post op    5. Hypertension  - controlled - GDMT initiation    6. Tobacco Abuse - smoking cessation advised  - nicotine patch   He is worried about being out of work. Works for Personal assistant. Has no short term disability benefits. Will consult TOC.   Length of Stay: [place], PA-C  06/07/2021, 9:08 AM  Advanced Heart Failure Team Pager [phone] (M-F; 7a - 5p)  Please contact [facility] for night-coverage after hours (5p -7a ) and weekends on [email or web address]  Patient seen and examined with the above-signed Advanced Practice Provider and/or Housestaff. I personally reviewed laboratory data, imaging studies and relevant notes. I independently examined the patient and formulated the important aspects of the plan. I have edited the note to reflect any of my changes or salient  points. I have personally discussed the plan with the patient and/or family.  Denies CP or SOB. Apprehensive about being out of work. CBGs up.   General:  Sitting up. No resp difficulty HEENT: normal Neck: supple. no JVD. Carotids 2+ bilat; no bruits. No lymphadenopathy or thryomegaly appreciated. Cor: PMI nondisplaced. Regular rate & rhythm. No rubs, gallops or murmurs. Lungs: clear Abdomen: soft, nontender, nondistended. No hepatosplenomegaly. No bruits or masses. Good bowel sounds. Extremities: no cyanosis, clubbing, rash, edema Neuro: alert & orientedx3, cranial nerves grossly intact. moves all 4 extremities w/o difficulty. Affect pleasant  Cath results reviewed with him. TCTS has seen. Plan CABG this week. Adjust DM regimen (appreciate DM coordinator).   Will add low-dose spiro. Further titrate GDMT after surgery. Continue ASA/statin.   Pancreatic lesion reviewed with Radiology. Will get MRI while he is here waiting for CABG>   Glori Bickers, MD  1:15 PM

## 2021-06-07 NOTE — Progress Notes (Signed)
1 Day Post-Op Procedure(s) (LRB): RIGHT/LEFT HEART CATH AND CORONARY ANGIOGRAPHY (N/A) Subjective: No symptoms of chest pain Nasal swab positive for MSRA-Bactroban ordered Carotid ultrasound shows no significant carotid stenosis, no significant subclavian disease CBGs ranging 1 75-200 on sliding scale plus at bedtime Lantus  Plan multivessel CABG on Friday, 06-10-2021  Objective: Vital signs in last 24 hours: Temp:  [97.8 F (36.6 C)-98.8 F (37.1 C)] 98.5 F (36.9 C) (01/17 1129) Pulse Rate:  [74-88] 76 (01/17 1644) Cardiac Rhythm: Normal sinus rhythm (01/17 0700) Resp:  [11-19] 18 (01/17 1644) BP: (112-130)/(75-83) 115/75 (01/17 1644) SpO2:  [95 %-98 %] 97 % (01/17 1644) Weight:  [75.1 kg] 75.1 kg (01/17 0455)  Hemodynamic parameters for last 24 hours: Sinus  Intake/Output from previous day: 01/16 0701 - 01/17 0700 In: 709.4 [P.O.:462; I.V.:247.4] Out: 1475 [Urine:1475] Intake/Output this shift: Total I/O In: -  Out: 1100 [Urine:1100]  Exam No bleeding from cath site Lungs clear No edema  Lab Results: Recent Labs    06/06/21 1428 06/07/21 0423  WBC 8.4 8.8  HGB 15.7 15.7  HCT 47.1 48.5  PLT 341 364   BMET:  Recent Labs    06/06/21 1428 06/07/21 0423  NA 139 135  K 4.4 4.4  CL 99 100  CO2 28 20*  GLUCOSE 169* 167*  BUN 35* 39*  CREATININE 1.44* 1.46*  CALCIUM 10.0 10.2    PT/INR: No results for input(s): LABPROT, INR in the last 72 hours. ABG    Component Value Date/Time   PHART 7.429 06/07/2021 0547   HCO3 23.9 06/07/2021 0547   TCO2 31 06/06/2021 1250   O2SAT 93.8 06/07/2021 0547   CBG (last 3)  Recent Labs    06/07/21 0813 06/07/21 1153 06/07/21 1643  GLUCAP 190* 231* 186*    Assessment/Plan: S/P Procedure(s) (LRB): RIGHT/LEFT HEART CATH AND CORONARY ANGIOGRAPHY (N/A) Follow-up abdominal MRI to evaluate pancreatic abnormality noted on CT Follow BUN/creatinine with active diuresis CABG on Friday  LOS: 1 day    Dahlia Byes 06/07/2021

## 2021-06-07 NOTE — Progress Notes (Addendum)
Inpatient Diabetes Program Recommendations  AACE/ADA: New Consensus Statement on Inpatient Glycemic Control (2015)  Target Ranges:  Prepandial:   less than 140 mg/dL      Peak postprandial:   less than 180 mg/dL (1-2 hours)      Critically ill patients:  140 - 180 mg/dL   Lab Results  Component Value Date   GLUCAP 190 (H) 06/07/2021   HGBA1C 10.7 (H) 06/07/2021    Review of Glycemic Control  Latest Reference Range & Units 06/06/21 13:22 06/07/21 08:13  Glucose-Capillary 70 - 99 mg/dL 177 (H) 190 (H)   Diabetes history: DM Outpatient Diabetes medications:  Basaglar 12-50 units daily?, Metformin (per patient he plans on stopping this medication) Current orders for Inpatient glycemic control:  None Inpatient Diabetes Program Recommendations:   Please add Novolog sensitive correction tid with meals and Semglee 10 units daily.    Thanks,  Adah Perl, RN, BC-ADM Inpatient Diabetes Coordinator Pager [phone]  (8a-5p)

## 2021-06-07 NOTE — Progress Notes (Signed)
Mobility Specialist Progress Note:   06/07/21 1000  Mobility  Activity Ambulated independently in hallway  Level of Assistance Independent  Assistive Device None  Distance Ambulated (ft) 1000 ft  Activity Response Tolerated well  $Mobility charge 1 Mobility   Pt received in bed willing to participate in mobility. No complaints of pain and asymptomatic. Pt left in chair with call bell in reach and all needs met.   [facility] Public librarian Phone [phone] Secondary Phone [phone]

## 2021-06-07 NOTE — Progress Notes (Signed)
Pt out for test. Discussed preop ed with [name], s/o. Discussed sternal precautions, d/c planning, mobility. Voiced understanding, she is working on d/c plan since she lives an hour away from pt and works. She will discuss with pt and his family. Left pt OHS booklet, careguide, and preop video to view. He has been ambulating. Will f/u tomorrow. [place] CES, ACSM 2:47 PM 06/07/2021

## 2021-06-07 NOTE — Plan of Care (Signed)
°  Problem: Health Behavior/Discharge Planning: Goal: Ability to manage health-related needs will improve Outcome: Progressing   Problem: Clinical Measurements: Goal: Ability to maintain clinical measurements within normal limits will improve Outcome: Progressing Goal: Will remain free from infection Outcome: Progressing Goal: Diagnostic test results will improve Outcome: Progressing Goal: Respiratory complications will improve Outcome: Progressing Goal: Cardiovascular complication will be avoided Outcome: Progressing   Problem: Education: Goal: Ability to demonstrate management of disease process will improve Outcome: Progressing Goal: Ability to verbalize understanding of medication therapies will improve Outcome: Progressing Goal: Individualized Educational Video(s) Outcome: Progressing   Problem: Activity: Goal: Capacity to carry out activities will improve Outcome: Progressing

## 2021-06-07 NOTE — Plan of Care (Signed)
°  Problem: Clinical Measurements: Goal: Will remain free from infection Outcome: Progressing Goal: Respiratory complications will improve Outcome: Progressing   Problem: Activity: Goal: Capacity to carry out activities will improve Outcome: Progressing

## 2021-06-07 NOTE — Progress Notes (Signed)
IVT consulted for difficult PIV assessment/placement.  Pt needed to use the restroom upon assessment. IV Team will come back.

## 2021-06-08 ENCOUNTER — Encounter (HOSPITAL_COMMUNITY): Payer: Self-pay | Admitting: Internal Medicine

## 2021-06-08 ENCOUNTER — Inpatient Hospital Stay (HOSPITAL_COMMUNITY): Payer: BC Managed Care – PPO

## 2021-06-08 ENCOUNTER — Other Ambulatory Visit: Payer: Self-pay

## 2021-06-08 DIAGNOSIS — I5021 Acute systolic (congestive) heart failure: Secondary | ICD-10-CM | POA: Diagnosis not present

## 2021-06-08 DIAGNOSIS — J449 Chronic obstructive pulmonary disease, unspecified: Secondary | ICD-10-CM | POA: Diagnosis not present

## 2021-06-08 DIAGNOSIS — I2510 Atherosclerotic heart disease of native coronary artery without angina pectoris: Secondary | ICD-10-CM | POA: Diagnosis not present

## 2021-06-08 LAB — BASIC METABOLIC PANEL
Anion gap: 9 (ref 5–15)
BUN: 37 mg/dL — ABNORMAL HIGH (ref 6–20)
CO2: 23 mmol/L (ref 22–32)
Calcium: 9.4 mg/dL (ref 8.9–10.3)
Chloride: 103 mmol/L (ref 98–111)
Creatinine, Ser: 1.28 mg/dL — ABNORMAL HIGH (ref 0.61–1.24)
GFR, Estimated: >60 mL/min (ref >60)
Glucose, Bld: 161 mg/dL — ABNORMAL HIGH (ref 70–99)
Potassium: 4.5 mmol/L (ref 3.5–5.1)
Sodium: 135 mmol/L (ref 135–145)

## 2021-06-08 LAB — GLUCOSE, CAPILLARY
Glucose-Capillary: 199 mg/dL — ABNORMAL HIGH (ref 70–99)
Glucose-Capillary: 226 mg/dL — ABNORMAL HIGH (ref 70–99)
Glucose-Capillary: 200 mg/dL — ABNORMAL HIGH (ref 70–99)
Glucose-Capillary: 172 mg/dL — ABNORMAL HIGH (ref 70–99)

## 2021-06-08 MED ORDER — LIVING WELL WITH DIABETES BOOK
Freq: Once | Status: AC
  Filled 2021-06-08: qty 1

## 2021-06-08 MED ORDER — INSULIN ASPART 100 UNIT/ML IJ SOLN
0.0000 [IU] | Freq: Three times a day (TID) | INTRAMUSCULAR | Status: DC
  Administered 2021-06-08 – 2021-06-09 (×2): 3 [IU] via SUBCUTANEOUS
  Administered 2021-06-09: 5 [IU] via SUBCUTANEOUS
  Administered 2021-06-09 – 2021-06-10 (×2): 3 [IU] via SUBCUTANEOUS

## 2021-06-08 MED ORDER — INSULIN GLARGINE-YFGN 100 UNIT/ML ~~LOC~~ SOLN
12.0000 [IU] | Freq: Every day | SUBCUTANEOUS | Status: DC
  Administered 2021-06-08 – 2021-06-09 (×2): 12 [IU] via SUBCUTANEOUS
  Filled 2021-06-08 (×3): qty 0.12

## 2021-06-08 MED ORDER — SORBITOL 70 % SOLN
30.0000 mL | Freq: Once | Status: AC
  Administered 2021-06-08: 30 mL via ORAL
  Filled 2021-06-08: qty 30

## 2021-06-08 MED ORDER — SENNOSIDES-DOCUSATE SODIUM 8.6-50 MG PO TABS
2.0000 | ORAL_TABLET | Freq: Every day | ORAL | Status: DC
  Administered 2021-06-08: 2 via ORAL
  Filled 2021-06-08: qty 2

## 2021-06-08 MED ORDER — GADOBUTROL 1 MMOL/ML IV SOLN
6.0000 mL | Freq: Once | INTRAVENOUS | Status: AC | PRN
  Administered 2021-06-08: 6 mL via INTRAVENOUS

## 2021-06-08 MED ORDER — LOSARTAN POTASSIUM 25 MG PO TABS
12.5000 mg | ORAL_TABLET | Freq: Every day | ORAL | Status: DC
  Administered 2021-06-08 – 2021-06-09 (×2): 12.5 mg via ORAL
  Filled 2021-06-08 (×2): qty 1

## 2021-06-08 NOTE — Plan of Care (Signed)
°  Problem: Health Behavior/Discharge Planning: Goal: Ability to manage health-related needs will improve Outcome: Progressing   Problem: Clinical Measurements: Goal: Will remain free from infection Outcome: Progressing Goal: Diagnostic test results will improve Outcome: Progressing Goal: Respiratory complications will improve Outcome: Progressing   Problem: Activity: Goal: Capacity to carry out activities will improve Outcome: Progressing

## 2021-06-08 NOTE — Progress Notes (Signed)
Checked with pt, no questions regarding surgery and recovery. Is ambulating and using IS.  [id number] [place], ACSM 11:15 AM 06/08/2021

## 2021-06-08 NOTE — Progress Notes (Signed)
2 Days Post-Op Procedure(s) (LRB): RIGHT/LEFT HEART CATH AND CORONARY ANGIOGRAPHY (N/A) Subjective: Abdominal MRI shows the pancreatic abnormality to be probable inflammatory Patient stable without angina and ambulating in the hallway Receiving pulmonary toilet for his COPD from longstanding smoking  Objective: Vital signs in last 24 hours: Temp:  [97.8 F (36.6 C)-98 F (36.7 C)] 98 F (36.7 C) (01/18 0755) Pulse Rate:  [76-95] 77 (01/18 1528) Cardiac Rhythm: Normal sinus rhythm (01/18 1300) Resp:  [16-19] 18 (01/18 1528) BP: (103-134)/(70-89) 113/82 (01/18 1528) SpO2:  [93 %-99 %] 93 % (01/18 1528) Weight:  [75.9 kg] 75.9 kg (01/18 0515)  Hemodynamic parameters for last 24 hours:    Intake/Output from previous day: 01/17 0701 - 01/18 0700 In: 465 [P.O.:462; I.V.:3] Out: 1900 [Urine:1900] Intake/Output this shift: Total I/O In: 480 [P.O.:480] Out: -   Sinus rhythm Lungs clear No edema No hematoma at cath site  Lab Results: Recent Labs    06/06/21 1428 06/07/21 0423  WBC 8.4 8.8  HGB 15.7 15.7  HCT 47.1 48.5  PLT 341 364   BMET:  Recent Labs    06/07/21 0423 06/08/21 0433  NA 135 135  K 4.4 4.5  CL 100 103  CO2 20* 23  GLUCOSE 167* 161*  BUN 39* 37*  CREATININE 1.46* 1.28*  CALCIUM 10.2 9.4    PT/INR: No results for input(s): LABPROT, INR in the last 72 hours. ABG    Component Value Date/Time   PHART 7.429 06/07/2021 0547   HCO3 23.9 06/07/2021 0547   TCO2 31 06/06/2021 1250   O2SAT 93.8 06/07/2021 0547   CBG (last 3)  Recent Labs    06/08/21 0628 06/08/21 1101 06/08/21 1524  GLUCAP 199* 226* 200*    Assessment/Plan: S/P Procedure(s) (LRB): RIGHT/LEFT HEART CATH AND CORONARY ANGIOGRAPHY (N/A) Plan multivessel CABG on Friday, January 20.   LOS: 2 days    Dahlia Byes 06/08/2021

## 2021-06-08 NOTE — Plan of Care (Signed)
°  Problem: Health Behavior/Discharge Planning: Goal: Ability to manage health-related needs will improve Outcome: Progressing   Problem: Clinical Measurements: Goal: Ability to maintain clinical measurements within normal limits will improve Outcome: Progressing Goal: Will remain free from infection Outcome: Progressing Goal: Diagnostic test results will improve Outcome: Progressing Goal: Respiratory complications will improve Outcome: Progressing Goal: Cardiovascular complication will be avoided Outcome: Progressing   Problem: Education: Goal: Ability to demonstrate management of disease process will improve Outcome: Progressing Goal: Ability to verbalize understanding of medication therapies will improve Outcome: Progressing Goal: Individualized Educational Video(s) Outcome: Progressing   Problem: Activity: Goal: Capacity to carry out activities will improve Outcome: Progressing

## 2021-06-08 NOTE — Progress Notes (Signed)
Advanced Heart Failure Rounding Note  PCP-Cardiologist: None   Subjective:    R/LHC w/ severe 3V CAD. Echo EF 25-30%. RV normal. Awaiting potential CABG  No CP or SOB.   Ab MRI -> pancreatic lesion looks like thrombosed vessel   Objective:   Weight Range: 75.9 kg Body mass index is 25.45 kg/m.   Vital Signs:   Temp:  [97.8 F (36.6 C)-98.5 F (36.9 C)] 98 F (36.7 C) (01/18 0755) Pulse Rate:  [76-95] 95 (01/18 0755) Resp:  [16-18] 18 (01/18 0755) BP: (103-134)/(70-89) 111/89 (01/18 0755) SpO2:  [94 %-99 %] 98 % (01/18 0847) Weight:  [75.9 kg] 75.9 kg (01/18 0515) Last BM Date: 06/06/21  Weight change: Filed Weights   06/06/21 1400 06/07/21 0455 06/08/21 0515  Weight: 77.9 kg 75.1 kg 75.9 kg    Intake/Output:   Intake/Output Summary (Last 24 hours) at 06/08/2021 0917 Last data filed at 06/08/2021 0825 Gross per 24 hour  Intake 705 ml  Output 1900 ml  Net -1195 ml       Physical Exam    General:  Well appearing. No resp difficulty HEENT: normal Neck: supple. no JVD. Carotids 2+ bilat; no bruits. No lymphadenopathy or thryomegaly appreciated. Cor: PMI nondisplaced. Regular rate & rhythm. No rubs, gallops or murmurs. Lungs: clear Abdomen: soft, nontender, nondistended. No hepatosplenomegaly. No bruits or masses. Good bowel sounds. Extremities: no cyanosis, clubbing, rash, edema Neuro: alert & orientedx3, cranial nerves grossly intact. moves all 4 extremities w/o difficulty. Affect pleasant   Telemetry   NSR, 80-90s Personally reviewed  Labs    CBC Recent Labs    06/06/21 1428 06/07/21 0423  WBC 8.4 8.8  NEUTROABS 5.7  --   HGB 15.7 15.7  HCT 47.1 48.5  MCV 85.0 86.1  PLT 341 696    Basic Metabolic Panel Recent Labs    06/06/21 1428 06/07/21 0423 06/08/21 0433  NA 139 135 135  K 4.4 4.4 4.5  CL 99 100 103  CO2 28 20* 23  GLUCOSE 169* 167* 161*  BUN 35* 39* 37*  CREATININE 1.44* 1.46* 1.28*  CALCIUM 10.0 10.2 9.4  MG 2.2  --    --     Liver Function Tests Recent Labs    06/06/21 1428  AST 18  ALT 23  ALKPHOS 96  BILITOT 0.9  PROT 6.9  ALBUMIN 3.7    No results for input(s): LIPASE, AMYLASE in the last 72 hours. Cardiac Enzymes No results for input(s): CKTOTAL, CKMB, CKMBINDEX, TROPONINI in the last 72 hours.  BNP: BNP (last 3 results) No results for input(s): BNP in the last 8760 hours.  ProBNP (last 3 results) No results for input(s): PROBNP in the last 8760 hours.   D-Dimer No results for input(s): DDIMER in the last 72 hours. Hemoglobin A1C Recent Labs    06/07/21 0423  HGBA1C 10.7*    Fasting Lipid Panel Recent Labs    06/07/21 0423  CHOL 241*  HDL 29*  LDLCALC 155*  TRIG 284*  CHOLHDL 8.3    Thyroid Function Tests Recent Labs    06/07/21 0423  TSH 2.153     Other results:   Imaging    MR ABDOMEN W WO CONTRAST  Result Date: 06/08/2021 CLINICAL DATA:  Evaluate enhancing structure noted along the anterior aspect of the body of pancreas. EXAM: MRI ABDOMEN WITHOUT AND WITH CONTRAST TECHNIQUE: Multiplanar multisequence MR imaging of the abdomen was performed both before and after the administration of intravenous contrast.  CONTRAST:  32mL GADAVIST GADOBUTROL 1 MMOL/ML IV SOLN COMPARISON:  CT angio chest 06/04/2021.  CT AP 06/24/2018. FINDINGS: Lower chest: Interval resolution of bilateral pleural effusions. Hepatobiliary: No suspicious liver abnormality. Within the posterior dome of liver there is a focal, peripheral and wedge-shaped area of hyperenhancement which likely represents a transient hepatic intensity defect, image 18/15. No suspicious enhancing liver abnormalities identified. Focal area of glandular proliferation within the gallbladder fundus measures 1.4 cm and is compatible with benign adenomyomatosis, image 27/4. No gallstones identified. No bile duct dilatation. Pancreas: No main duct dilatation or inflammation identified. No solid enhancing pancreas lesion  identified. Corresponding to the recent CT abnormality is a focal area relative hypoenhancement measuring 2.2 x 1.0 cm, image 41/17. This does not have the typical appearance of the pancreatic neoplasm in may represent a thrombosed vessel. Spleen:  Within normal limits in size and appearance. Adrenals/Urinary Tract: Normal adrenal glands. Small left kidney cysts. No suspicious kidney mass or hydronephrosis identified. Stomach/Bowel: Visualized portions within the abdomen are unremarkable. Vascular/Lymphatic: Aortic atherosclerosis. No adenopathy identified. Other:  None. Musculoskeletal: No suspicious bone lesions identified. IMPRESSION: 1. Corresponding to the recent CT abnormality there is a focal area of relative hypoenhancement measuring 2.2 x 1.0 cm. This does not have the typical appearance of the pancreatic neoplasm. Review of the recent CT from 06/04/2021 shows possible calcifications within this periphery of this structure. As the presence of calcifications can be difficult to identified on MRI as well as contrast enhanced CT, further evaluation with pancreas protocol CT without and with contrast material is advised at this time. 2. No suspicious liver abnormalities. Focal area of glandular proliferation within the gallbladder fundus is compatible with benign adenomyomatosis. 3. Interval resolution of bilateral pleural effusions. Electronically Signed   By: Kerby Moors M.D.   On: 06/08/2021 08:54   VAS US DOPPLER PRE CABG  Result Date: 06/07/2021 PREOPERATIVE VASCULAR EVALUATION Patient Name:  Kenneth Bailey [facility]  Date of Exam:   06/07/2021 Medical Rec #: [id number]            Accession #:    [id number] Date of Birth: [birth year]             Patient Gender: M Patient Age:   56 years Exam Location:  [facility] Procedure:      VAS US DOPPLER PRE CABG Referring Phys: Collier Salina VANTRIGT --------------------------------------------------------------------------------  Indications:      Pre-CABG. Risk  Factors:     Hyperlipidemia, Diabetes. Comparison Study: No prior studies. Performing Technologist: Darlin Coco RDMS RVT  Examination Guidelines: A complete evaluation includes B-mode imaging, spectral Doppler, color Doppler, and power Doppler as needed of all accessible portions of each vessel. Bilateral testing is considered an integral part of a complete examination. Limited examinations for reoccurring indications may be performed as noted.  Right Carotid Findings: +----------+--------+--------+--------+-------------------------------+--------+             PSV cm/s EDV cm/s Stenosis Describe                        Comments  +----------+--------+--------+--------+-------------------------------+--------+  CCA Prox   83       17                                                          +----------+--------+--------+--------+-------------------------------+--------+  CCA Distal 58       17                                                          +----------+--------+--------+--------+-------------------------------+--------+  ICA Prox   62       29       1-39%    heterogenous, smooth and                                                         diffuse                                   +----------+--------+--------+--------+-------------------------------+--------+  ICA Distal 56       20                                                          +----------+--------+--------+--------+-------------------------------+--------+  ECA        143      18                                                          +----------+--------+--------+--------+-------------------------------+--------+ +----------+--------+-------+----------------+------------+             PSV cm/s EDV cms Describe         Arm Pressure  +----------+--------+-------+----------------+------------+  Subclavian 107              Multiphasic, WNL               +----------+--------+-------+----------------+------------+  +---------+--------+--+--------+--+---------+  Vertebral PSV cm/s 28 EDV cm/s 12 Antegrade  +---------+--------+--+--------+--+---------+ Left Carotid Findings: +----------+--------+--------+--------+------------------------+--------+             PSV cm/s EDV cm/s Stenosis Describe                 Comments  +----------+--------+--------+--------+------------------------+--------+  CCA Prox   105      23                                                   +----------+--------+--------+--------+------------------------+--------+  CCA Distal 92       21                                                   +----------+--------+--------+--------+------------------------+--------+  ICA Prox   78       24       1-39%    heterogenous and diffuse           +----------+--------+--------+--------+------------------------+--------+  ICA Distal 62       25                                                   +----------+--------+--------+--------+------------------------+--------+  ECA        134      17                                                   +----------+--------+--------+--------+------------------------+--------+  +----------+--------+--------+----------------+------------+  Subclavian PSV cm/s EDV cm/s Describe         Arm Pressure  +----------+--------+--------+----------------+------------+             106               Multiphasic, WNL               +----------+--------+--------+----------------+------------+ +---------+--------+--+--------+--+---------+  Vertebral PSV cm/s 25 EDV cm/s 10 Antegrade  +---------+--------+--+--------+--+---------+  ABI Findings: +---------+------------------+-----+----------+--------+  Right     Rt Pressure (mmHg) Index Waveform   Comment   +---------+------------------+-----+----------+--------+  Brachial  112                      triphasic            +---------+------------------+-----+----------+--------+  PTA       65                 0.58  monophasic            +---------+------------------+-----+----------+--------+  DP        66                 0.59  monophasic           +---------+------------------+-----+----------+--------+  Great Toe 25                 0.22  Abnormal             +---------+------------------+-----+----------+--------+ +---------+------------------+-----+----------+-------+  Left      Lt Pressure (mmHg) Index Waveform   Comment  +---------+------------------+-----+----------+-------+  Brachial  111                      triphasic           +---------+------------------+-----+----------+-------+  PTA       75                 0.67  monophasic          +---------+------------------+-----+----------+-------+  DP        83                 0.74  monophasic          +---------+------------------+-----+----------+-------+  Great Toe 43                 0.38  Abnormal            +---------+------------------+-----+----------+-------+  Right Doppler Findings: +--------+--------+-----+---------+--------+  Site     Pressure Index Doppler   Comments  +--------+--------+-----+---------+--------+  Brachial 112            triphasic           +--------+--------+-----+---------+--------+  Radial  triphasic           +--------+--------+-----+---------+--------+  Ulnar                   triphasic           +--------+--------+-----+---------+--------+  Left Doppler Findings: +--------+--------+-----+---------+--------+  Site     Pressure Index Doppler   Comments  +--------+--------+-----+---------+--------+  Brachial 111            triphasic           +--------+--------+-----+---------+--------+  Radial                  triphasic           +--------+--------+-----+---------+--------+  Ulnar                   triphasic           +--------+--------+-----+---------+--------+  Summary: Right Carotid: Velocities in the right ICA are consistent with a 1-39% stenosis. Left Carotid: Velocities in the left ICA are consistent with a 1-39% stenosis. Vertebrals:   Bilateral vertebral arteries demonstrate antegrade flow. Subclavians: Normal flow hemodynamics were seen in bilateral subclavian              arteries. Right ABI: Resting right ankle-brachial index indicates moderate right lower extremity arterial disease. Left ABI: Resting left ankle-brachial index indicates moderate left lower extremity arterial disease. Right Upper Extremity: Doppler waveforms remain within normal limits with right radial compression. Doppler waveforms remain within normal limits with right ulnar compression. Left Upper Extremity: Doppler waveforms remain within normal limits with left radial compression. Doppler waveforms remain within normal limits with left ulnar compression.  Electronically signed by Deitra Mayo MD on 06/07/2021 at 3:14:50 PM.    Final      Medications:     Scheduled Medications:  aspirin EC  81 mg Oral Daily   atorvastatin  80 mg Oral Q2000   enoxaparin (LOVENOX) injection  40 mg Subcutaneous Q24H   insulin aspart  0-5 Units Subcutaneous QHS   insulin aspart  0-9 Units Subcutaneous TID WC   insulin glargine-yfgn  10 Units Subcutaneous QHS   melatonin  5 mg Oral QHS   mometasone-formoterol  2 puff Inhalation BID   mupirocin cream   Topical BID   nicotine  14 mg Transdermal Daily   sodium chloride flush  3 mL Intravenous Q12H   sodium chloride flush  3 mL Intravenous Q12H   spironolactone  12.5 mg Oral Daily    Infusions:  sodium chloride     sodium chloride      PRN Medications: sodium chloride, sodium chloride, acetaminophen, albuterol, ondansetron (ZOFRAN) IV, sodium chloride flush, sodium chloride flush    Patient Profile   56 y/o male w/ multiple cardiac risk factors, HTN, HLD, tobacco abuse and T2DM, admitted w/ new systolic heart failure. Echo at [place], EF 20-25%, RV mildly reduced. Transferred to [facility]. R/LHC showed severe 3v CAD, ischemic CM EF ~25-30%, well compensated filling pressures and preserved CO. CT surgery following  for potential CABG.     Assessment/Plan   1. Acute Systolic Heart Failure (New)/ Ischemic CM  - Echo at [facility]: LVEF 25-30%, global HK. RV mildly reduced  - Echo [facility] EF 25-30%, RV normal  - Hs trop negative x 3. COVID and Flu negative - LHC w/ severe 3V CAD, awaiting CABG  - RHC w/ well compensated filling pressures and preserved CO - volume ok. - continue spiro - add low-dose losartan  2. CAD -  LHC w/ severe 3V CAD - CP free - ASA 81 mg  - Atorva 80 mg  - d/w Dr. Prescott Gum. CABG Friday am   3. Renal Insuffiencey - SCr 1.4 at [facility], baseline unknown - Scr 1.28 this am   4. Pancreatic Lesion - incidental finding of a 2.2 x 0.8 cm hypervascular structure in the body of the pancreas noted on CT - MRI shows benign thrombosed vessel (had remote h/o severe pancreatitis)  4. Type 2DM - Hgb A1c 10.7 - SSI  - would benefit from SGLT2i post op  - appreciate DM coordinator's recs   5. Hypertension  - controlled - GDMT initiation    6. Tobacco Abuse - smoking cessation advised  - nicotine patch   Length of Stay: 2  Glori Bickers, MD  06/08/2021, 9:17 AM  Advanced Heart Failure Team Pager [phone] (M-F; 7a - 5p)  Please contact [facility] for night-coverage after hours (5p -7a ) and weekends on [email or web address]

## 2021-06-08 NOTE — TOC Progression Note (Addendum)
Transition of Care [facility]) - Progression Note    Patient Details  Name: Kenneth Bailey MRN: [id number] Date of Birth: [birth year]  Transition of Care [facility]) CM/SW Contact  Zenon Mayo, RN Phone Number: 06/08/2021, 11:00 AM  Clinical Narrative:     Transition of Care [facility]) Screening Note   Patient Details  Name: Kenneth Bailey Date of Birth: [birth year]   Transition of Care [facility]) CM/SW Contact:    Zenon Mayo, RN Phone Number: 06/08/2021, 11:00 AM    Transition of Care Department [facility]) has reviewed patient and no TOC needs have been identified at this time. We will continue to monitor patient advancement through interdisciplinary progression rounds. If new patient transition needs arise, please place a TOC consult.   1/18- patient is from home alone, he still works and drives , he is indep.  He states he would like his medications to be sent to [facility] in [place] except for any new medication.  The new medication can be filled at the [facility] to use the 30 day free coupons. He states he did not sign up for short term disability on his insurance at work.  NCM informed him to check with his employer, it may be too late for him to sign up for short term disability.         Expected Discharge Plan and Services                                                 Social Determinants of Health (SDOH) Interventions    Readmission Risk Interventions No flowsheet data found.

## 2021-06-08 NOTE — Progress Notes (Addendum)
Inpatient Diabetes Program Recommendations  AACE/ADA: New Consensus Statement on Inpatient Glycemic Control (2015)  Target Ranges:  Prepandial:   less than 140 mg/dL      Peak postprandial:   less than 180 mg/dL (1-2 hours)      Critically ill patients:  140 - 180 mg/dL   Lab Results  Component Value Date   GLUCAP 199 (H) 06/08/2021   HGBA1C 10.7 (H) 06/07/2021    Review of Glycemic Control  Latest Reference Range & Units 06/06/21 13:22 06/07/21 08:13 06/07/21 11:53 06/07/21 16:43 06/07/21 21:16 06/08/21 06:28  Glucose-Capillary 70 - 99 mg/dL 177 (H) 190 (H) 231 (H) 186 (H) 256 (H) 199 (H)   Diabetes history: DM  Outpatient Diabetes medications:  Basaglar 12-50 units daily?, Metformin (per patient he plans on stopping this medication) Current orders for Inpatient glycemic control:  Semglee 10 units q HS Novolog sensitive tid with meals and HS  Inpatient Diabetes Program Recommendations:    Consider increasing Semglee to 20 units q HS.   Thanks,  Adah Perl, RN, BC-ADM Inpatient Diabetes Coordinator Pager [phone]  (8a-5p)  Addendum:  1330- Discussed A1C of 10.7% with patient. He states he takes between 12 and 50 units of Basal insulin at home based on "what he's eating" .  I shared A1C with him and he said he was not surprised.  Explained that basal insulin affects first fasting glucose and therefore is usually not dosed based on what one eats.  Explained that in the hospital, we recommend the use of rapid acting insulin for meals.  We also dicussed the importance of glycemic control post-cardiac surgery.  Explained that he will likely have specific insulin doses at d/c (especially basal insulin).  Patient verbalized understanding.  Will order Living well with DM booklet for patient.

## 2021-06-08 NOTE — Progress Notes (Signed)
Patient stated he has never been vaccinated for COVID-19 and does not want to be vaccinated. He also states he does not want to receive the influenza vaccine either. Pt is alert and oriented x 4.

## 2021-06-09 ENCOUNTER — Inpatient Hospital Stay (HOSPITAL_COMMUNITY): Payer: BC Managed Care – PPO

## 2021-06-09 DIAGNOSIS — J449 Chronic obstructive pulmonary disease, unspecified: Secondary | ICD-10-CM | POA: Diagnosis not present

## 2021-06-09 DIAGNOSIS — I5021 Acute systolic (congestive) heart failure: Secondary | ICD-10-CM | POA: Diagnosis not present

## 2021-06-09 DIAGNOSIS — I2510 Atherosclerotic heart disease of native coronary artery without angina pectoris: Secondary | ICD-10-CM | POA: Diagnosis not present

## 2021-06-09 LAB — PULMONARY FUNCTION TEST
DL/VA % pred: 84 %
DL/VA: 3.73 ml/min/mmHg/L
DLCO cor % pred: 70 %
DLCO cor: 18.94 ml/min/mmHg
DLCO unc % pred: 72 %
DLCO unc: 19.5 ml/min/mmHg
FEF 25-75 Post: 2.02 L/sec
FEF 25-75 Pre: 2.71 L/sec
FEF2575-%Change-Post: -25 %
FEF2575-%Pred-Post: 65 %
FEF2575-%Pred-Pre: 87 %
FEV1-%Change-Post: -16 %
FEV1-%Pred-Post: 67 %
FEV1-%Pred-Pre: 81 %
FEV1-Post: 2.41 L
FEV1-Pre: 2.89 L
FEV1FVC-%Change-Post: -14 %
FEV1FVC-%Pred-Pre: 103 %
FEV6-%Change-Post: -2 %
FEV6-%Pred-Post: 80 %
FEV6-%Pred-Pre: 82 %
FEV6-Post: 3.56 L
FEV6-Pre: 3.64 L
FEV6FVC-%Pred-Post: 104 %
FEV6FVC-%Pred-Pre: 104 %
FVC-%Change-Post: -2 %
FVC-%Pred-Post: 77 %
FVC-%Pred-Pre: 78 %
FVC-Post: 3.56 L
FVC-Pre: 3.64 L
Post FEV1/FVC ratio: 68 %
Post FEV6/FVC ratio: 100 %
Pre FEV1/FVC ratio: 80 %
Pre FEV6/FVC Ratio: 100 %
RV % pred: 114 %
RV: 2.3 L
TLC % pred: 89 %
TLC: 5.89 L

## 2021-06-09 LAB — BASIC METABOLIC PANEL
Anion gap: 9 (ref 5–15)
BUN: 30 mg/dL — ABNORMAL HIGH (ref 6–20)
CO2: 24 mmol/L (ref 22–32)
Calcium: 9.5 mg/dL (ref 8.9–10.3)
Chloride: 104 mmol/L (ref 98–111)
Creatinine, Ser: 1.27 mg/dL — ABNORMAL HIGH (ref 0.61–1.24)
GFR, Estimated: >60 mL/min (ref >60)
Glucose, Bld: 167 mg/dL — ABNORMAL HIGH (ref 70–99)
Potassium: 4.5 mmol/L (ref 3.5–5.1)
Sodium: 137 mmol/L (ref 135–145)

## 2021-06-09 LAB — PREPARE RBC (CROSSMATCH)

## 2021-06-09 LAB — GLUCOSE, CAPILLARY
Glucose-Capillary: 174 mg/dL — ABNORMAL HIGH (ref 70–99)
Glucose-Capillary: 159 mg/dL — ABNORMAL HIGH (ref 70–99)
Glucose-Capillary: 226 mg/dL — ABNORMAL HIGH (ref 70–99)
Glucose-Capillary: 153 mg/dL — ABNORMAL HIGH (ref 70–99)

## 2021-06-09 LAB — ABO/RH: ABO/RH(D): O POS

## 2021-06-09 MED ORDER — NOREPINEPHRINE 4 MG/250ML-% IV SOLN
0.0000 ug/min | INTRAVENOUS | Status: AC
  Administered 2021-06-10: 3 ug/min via INTRAVENOUS
  Filled 2021-06-09: qty 250

## 2021-06-09 MED ORDER — MAGNESIUM SULFATE 50 % IJ SOLN
40.0000 meq | INTRAMUSCULAR | Status: DC
  Filled 2021-06-09: qty 9.85

## 2021-06-09 MED ORDER — EPINEPHRINE HCL 5 MG/250ML IV SOLN IN NS
0.0000 ug/min | INTRAVENOUS | Status: AC
  Administered 2021-06-10: 2 ug/min via INTRAVENOUS
  Filled 2021-06-09: qty 250

## 2021-06-09 MED ORDER — CHLORHEXIDINE GLUCONATE 4 % EX LIQD
60.0000 mL | Freq: Once | CUTANEOUS | Status: AC
  Administered 2021-06-10: 4 via TOPICAL
  Filled 2021-06-09 (×2): qty 60

## 2021-06-09 MED ORDER — CEFAZOLIN SODIUM-DEXTROSE 2-4 GM/100ML-% IV SOLN
2.0000 g | INTRAVENOUS | Status: DC
  Filled 2021-06-09: qty 100

## 2021-06-09 MED ORDER — POTASSIUM CHLORIDE 2 MEQ/ML IV SOLN
80.0000 meq | INTRAVENOUS | Status: DC
  Filled 2021-06-09: qty 40

## 2021-06-09 MED ORDER — TRANEXAMIC ACID 1000 MG/10ML IV SOLN
1.5000 mg/kg/h | INTRAVENOUS | Status: AC
  Administered 2021-06-10: 1.5 mg/kg/h via INTRAVENOUS
  Filled 2021-06-09: qty 25

## 2021-06-09 MED ORDER — VANCOMYCIN HCL 1250 MG/250ML IV SOLN
1250.0000 mg | INTRAVENOUS | Status: DC
  Filled 2021-06-09: qty 250

## 2021-06-09 MED ORDER — VANCOMYCIN HCL 1250 MG/250ML IV SOLN
1250.0000 mg | INTRAVENOUS | Status: AC
  Administered 2021-06-10: 1250 mg via INTRAVENOUS
  Filled 2021-06-09: qty 250

## 2021-06-09 MED ORDER — PHENYLEPHRINE HCL-NACL 20-0.9 MG/250ML-% IV SOLN
30.0000 ug/min | INTRAVENOUS | Status: DC
  Filled 2021-06-09: qty 250

## 2021-06-09 MED ORDER — DEXMEDETOMIDINE HCL IN NACL 400 MCG/100ML IV SOLN
0.1000 ug/kg/h | INTRAVENOUS | Status: AC
  Administered 2021-06-10: .5 ug/kg/h via INTRAVENOUS
  Filled 2021-06-09: qty 100

## 2021-06-09 MED ORDER — HEPARIN 30,000 UNITS/1000 ML (OHS) CELLSAVER SOLUTION
Status: DC
  Filled 2021-06-09: qty 1000

## 2021-06-09 MED ORDER — TRANEXAMIC ACID (OHS) BOLUS VIA INFUSION
15.0000 mg/kg | INTRAVENOUS | Status: AC
  Administered 2021-06-10: 1137 mg via INTRAVENOUS
  Filled 2021-06-09: qty 1137

## 2021-06-09 MED ORDER — TEMAZEPAM 15 MG PO CAPS: 15.0000 mg | ORAL_CAPSULE | Freq: Once | ORAL | Status: DC | PRN

## 2021-06-09 MED ORDER — DEXMEDETOMIDINE HCL IN NACL 400 MCG/100ML IV SOLN
0.1000 ug/kg/h | INTRAVENOUS | Status: DC
  Filled 2021-06-09: qty 100

## 2021-06-09 MED ORDER — ALPRAZOLAM 0.25 MG PO TABS: 0.2500 mg | ORAL_TABLET | ORAL | Status: DC | PRN

## 2021-06-09 MED ORDER — MILRINONE LACTATE IN DEXTROSE 20-5 MG/100ML-% IV SOLN
0.3000 ug/kg/min | INTRAVENOUS | Status: AC
  Administered 2021-06-10: .3 ug/kg/min via INTRAVENOUS
  Filled 2021-06-09: qty 100

## 2021-06-09 MED ORDER — INSULIN REGULAR(HUMAN) IN NACL 100-0.9 UT/100ML-% IV SOLN
INTRAVENOUS | Status: DC
  Filled 2021-06-09: qty 100

## 2021-06-09 MED ORDER — PLASMA-LYTE A IV SOLN
INTRAVENOUS | Status: DC
  Filled 2021-06-09: qty 5

## 2021-06-09 MED ORDER — NOREPINEPHRINE 4 MG/250ML-% IV SOLN
0.0000 ug/min | INTRAVENOUS | Status: DC
  Filled 2021-06-09: qty 250

## 2021-06-09 MED ORDER — TRANEXAMIC ACID (OHS) PUMP PRIME SOLUTION
2.0000 mg/kg | INTRAVENOUS | Status: DC
  Filled 2021-06-09: qty 1.52

## 2021-06-09 MED ORDER — MILRINONE LACTATE IN DEXTROSE 20-5 MG/100ML-% IV SOLN
0.3000 ug/kg/min | INTRAVENOUS | Status: DC
  Filled 2021-06-09: qty 100

## 2021-06-09 MED ORDER — PHENYLEPHRINE HCL-NACL 20-0.9 MG/250ML-% IV SOLN
30.0000 ug/min | INTRAVENOUS | Status: AC
  Administered 2021-06-10: 25 ug/min via INTRAVENOUS
  Filled 2021-06-09: qty 250

## 2021-06-09 MED ORDER — CHLORHEXIDINE GLUCONATE 0.12 % MT SOLN
15.0000 mL | Freq: Once | OROMUCOSAL | Status: AC
  Administered 2021-06-10: 15 mL via OROMUCOSAL
  Filled 2021-06-09: qty 15

## 2021-06-09 MED ORDER — NITROGLYCERIN IN D5W 200-5 MCG/ML-% IV SOLN
2.0000 ug/min | INTRAVENOUS | Status: DC
  Filled 2021-06-09: qty 250

## 2021-06-09 MED ORDER — EPINEPHRINE HCL 5 MG/250ML IV SOLN IN NS
0.0000 ug/min | INTRAVENOUS | Status: DC
  Filled 2021-06-09: qty 250

## 2021-06-09 MED ORDER — CHLORHEXIDINE GLUCONATE 4 % EX LIQD
60.0000 mL | Freq: Once | CUTANEOUS | Status: AC
  Administered 2021-06-09: 4 via TOPICAL
  Filled 2021-06-09: qty 60

## 2021-06-09 MED ORDER — TRANEXAMIC ACID (OHS) BOLUS VIA INFUSION
15.0000 mg/kg | INTRAVENOUS | Status: DC
  Filled 2021-06-09: qty 1071

## 2021-06-09 MED ORDER — DIAZEPAM 5 MG PO TABS
5.0000 mg | ORAL_TABLET | Freq: Once | ORAL | Status: AC
  Administered 2021-06-10: 5 mg via ORAL
  Filled 2021-06-09: qty 1

## 2021-06-09 MED ORDER — INSULIN REGULAR(HUMAN) IN NACL 100-0.9 UT/100ML-% IV SOLN
INTRAVENOUS | Status: AC
  Administered 2021-06-10: 4.6 [IU]/h via INTRAVENOUS
  Filled 2021-06-09: qty 100

## 2021-06-09 MED ORDER — CEFAZOLIN SODIUM-DEXTROSE 2-4 GM/100ML-% IV SOLN
2.0000 g | INTRAVENOUS | Status: AC
  Administered 2021-06-10: 2 g via INTRAVENOUS
  Filled 2021-06-09: qty 100

## 2021-06-09 MED ORDER — BISACODYL 5 MG PO TBEC
5.0000 mg | DELAYED_RELEASE_TABLET | Freq: Once | ORAL | Status: AC
  Administered 2021-06-09: 5 mg via ORAL
  Filled 2021-06-09: qty 1

## 2021-06-09 MED ORDER — TRANEXAMIC ACID 1000 MG/10ML IV SOLN
1.5000 mg/kg/h | INTRAVENOUS | Status: DC
  Filled 2021-06-09: qty 25

## 2021-06-09 MED ORDER — METOPROLOL TARTRATE 12.5 MG HALF TABLET
12.5000 mg | ORAL_TABLET | Freq: Once | ORAL | Status: AC
  Administered 2021-06-10: 12.5 mg via ORAL
  Filled 2021-06-09: qty 1

## 2021-06-09 NOTE — Progress Notes (Signed)
3 Days Post-Op Procedure(s) (LRB): RIGHT/LEFT HEART CATH AND CORONARY ANGIOGRAPHY (N/A) Subjective: No chest pains , ready for CABG in am at increased risk due to diabetic CAD and severe LV dysfunction with compensated cardiac output  Objective: Vital signs in last 24 hours: Temp:  [97.7 F (36.5 C)-98.6 F (37 C)] 97.8 F (36.6 C) (01/19 1500) Pulse Rate:  [79-92] 82 (01/19 1500) Cardiac Rhythm: Normal sinus rhythm (01/19 0700) Resp:  [16-20] 18 (01/19 1500) BP: (111-134)/(73-86) 113/75 (01/19 1500) SpO2:  [96 %-99 %] 96 % (01/19 1500) Weight:  [75.8 kg] 75.8 kg (01/19 0408)  Hemodynamic parameters for last 24 hours:   nsr Intake/Output from previous day: 01/18 0701 - 01/19 0700 In: 723 [P.O.:720; I.V.:3] Out: 975 [Urine:975] Intake/Output this shift: Total I/O In: 486 [P.O.:480; I.V.:6] Out: -   Lungs clear No edema abdomensoft  Lab Results: Recent Labs    06/07/21 0423  WBC 8.8  HGB 15.7  HCT 48.5  PLT 364   BMET:  Recent Labs    06/08/21 0433 06/09/21 0403  NA 135 137  K 4.5 4.5  CL 103 104  CO2 23 24  GLUCOSE 161* 167*  BUN 37* 30*  CREATININE 1.28* 1.27*  CALCIUM 9.4 9.5    PT/INR: No results for input(s): LABPROT, INR in the last 72 hours. ABG    Component Value Date/Time   PHART 7.429 06/07/2021 0547   HCO3 23.9 06/07/2021 0547   TCO2 31 06/06/2021 1250   O2SAT 93.8 06/07/2021 0547   CBG (last 3)  Recent Labs    06/08/21 2118 06/09/21 0615 06/09/21 1203  GLUCAP 172* 174* 159*    Assessment/Plan: S/P Procedure(s) (LRB): RIGHT/LEFT HEART CATH AND CORONARY ANGIOGRAPHY (N/A) CABG in am Th patient understands that if his heart function becomes worse than his preop baseline he may need placement of a temporary Impella assist device in the OR   LOS: 3 days    Dahlia Byes 06/09/2021

## 2021-06-09 NOTE — Progress Notes (Addendum)
Advanced Heart Failure Rounding Note  PCP-Cardiologist: None   Subjective:    R/LHC w/ severe 3V CAD. Echo EF 25-30%. RV normal.  CABG planned tomorrow.  Ab MRI -> pancreatic lesion looks like thrombosed vessel  No CP. Denies dyspnea.   BP and renal fx stable.   OOB eating breakfast. No complaints.    Objective:   Weight Range: 75.8 kg Body mass index is 25.42 kg/m.   Vital Signs:   Temp:  [97.8 F (36.6 C)-98.6 F (37 C)] 97.8 F (36.6 C) (01/19 0408) Pulse Rate:  [77-95] 79 (01/19 0408) Resp:  [16-20] 16 (01/19 0408) BP: (111-134)/(80-89) 134/84 (01/19 0408) SpO2:  [93 %-98 %] 97 % (01/19 0408) Weight:  [75.8 kg] 75.8 kg (01/19 0408) Last BM Date: 06/08/20  Weight change: Filed Weights   06/07/21 0455 06/08/21 0515 06/09/21 0408  Weight: 75.1 kg 75.9 kg 75.8 kg    Intake/Output:   Intake/Output Summary (Last 24 hours) at 06/09/2021 0715 Last data filed at 06/09/2021 0522 Gross per 24 hour  Intake 723 ml  Output 975 ml  Net -252 ml      Physical Exam    General:  Well appearing. No respiratory difficulty HEENT: normal Neck: supple. no JVD. Carotids 2+ bilat; no bruits. No lymphadenopathy or thyromegaly appreciated. Cor: PMI nondisplaced. Regular rate & rhythm. No rubs, gallops or murmurs. Lungs: clear Abdomen: soft, nontender, nondistended. No hepatosplenomegaly. No bruits or masses. Good bowel sounds. Extremities: no cyanosis, clubbing, rash, edema Neuro: alert & oriented x 3, cranial nerves grossly intact. moves all 4 extremities w/o difficulty. Affect pleasant.   Telemetry   NSR, 80-90s Personally reviewed  Labs    CBC Recent Labs    06/06/21 1428 06/07/21 0423  WBC 8.4 8.8  NEUTROABS 5.7  --   HGB 15.7 15.7  HCT 47.1 48.5  MCV 85.0 86.1  PLT 341 458   Basic Metabolic Panel Recent Labs    06/06/21 1428 06/07/21 0423 06/08/21 0433 06/09/21 0403  NA 139   < > 135 137  K 4.4   < > 4.5 4.5  CL 99   < > 103 104  CO2 28   <  > 23 24  GLUCOSE 169*   < > 161* 167*  BUN 35*   < > 37* 30*  CREATININE 1.44*   < > 1.28* 1.27*  CALCIUM 10.0   < > 9.4 9.5  MG 2.2  --   --   --    < > = values in this interval not displayed.   Liver Function Tests Recent Labs    06/06/21 1428  AST 18  ALT 23  ALKPHOS 96  BILITOT 0.9  PROT 6.9  ALBUMIN 3.7   No results for input(s): LIPASE, AMYLASE in the last 72 hours. Cardiac Enzymes No results for input(s): CKTOTAL, CKMB, CKMBINDEX, TROPONINI in the last 72 hours.  BNP: BNP (last 3 results) No results for input(s): BNP in the last 8760 hours.  ProBNP (last 3 results) No results for input(s): PROBNP in the last 8760 hours.   D-Dimer No results for input(s): DDIMER in the last 72 hours. Hemoglobin A1C Recent Labs    06/07/21 0423  HGBA1C 10.7*   Fasting Lipid Panel Recent Labs    06/07/21 0423  CHOL 241*  HDL 29*  LDLCALC 155*  TRIG 284*  CHOLHDL 8.3   Thyroid Function Tests Recent Labs    06/07/21 0423  TSH 2.153    Other  results:   Imaging    No results found.   Medications:     Scheduled Medications:  aspirin EC  81 mg Oral Daily   atorvastatin  80 mg Oral Q2000   enoxaparin (LOVENOX) injection  40 mg Subcutaneous Q24H   insulin aspart  0-15 Units Subcutaneous TID WC   insulin aspart  0-5 Units Subcutaneous QHS   insulin glargine-yfgn  12 Units Subcutaneous QHS   losartan  12.5 mg Oral Daily   melatonin  5 mg Oral QHS   mometasone-formoterol  2 puff Inhalation BID   mupirocin cream   Topical BID   nicotine  14 mg Transdermal Daily   senna-docusate  2 tablet Oral QHS   sodium chloride flush  3 mL Intravenous Q12H   sodium chloride flush  3 mL Intravenous Q12H   spironolactone  12.5 mg Oral Daily    Infusions:  sodium chloride     sodium chloride      PRN Medications: sodium chloride, sodium chloride, acetaminophen, albuterol, ondansetron (ZOFRAN) IV, sodium chloride flush, sodium chloride flush    Patient Profile    56 y/o male w/ multiple cardiac risk factors, HTN, HLD, tobacco abuse and T2DM, admitted w/ new systolic heart failure. Echo at [facility], EF 20-25%, RV mildly reduced. Transferred to [facility]. R/LHC showed severe 3v CAD, ischemic CM EF ~25-30%, well compensated filling pressures and preserved CO. CT surgery following for potential CABG.     Assessment/Plan   1. Acute Systolic Heart Failure (New)/ Ischemic CM  - Echo at [facility]: LVEF 25-30%, global HK. RV mildly reduced  - Echo [facility] EF 25-30%, RV normal  - Hs trop negative x 3. COVID and Flu negative - LHC w/ severe 3V CAD, awaiting CABG  - RHC w/ well compensated filling pressures and preserved CO - volume ok. Euvolemic  - continue spiro 12.5 mg - continue losartan 12.5 mg   2. CAD - LHC w/ severe 3V CAD - CP free - ASA 81 mg  - Atorva 80 mg  - d/w Dr. Prescott Gum. CABG Friday am   3. Renal Insuffiencey - SCr 1.4 at [facility], baseline unknown - Scr 1.27 this am   4. Pancreatic Lesion - incidental finding of a 2.2 x 0.8 cm hypervascular structure in the body of the pancreas noted on CT - MRI shows benign thrombosed vessel (had remote h/o severe pancreatitis)  4. Type 2DM - Hgb A1c 10.7 - SSI  - would benefit from SGLT2i post op  - appreciate DM coordinator's recs   5. Hypertension  - controlled - GDMT initiation    6. Tobacco Abuse - smoking cessation advised  - nicotine patch   Length of Stay: 3  Brittainy Simmons, PA-C  06/09/2021, 7:15 AM  Advanced Heart Failure Team Pager [phone] (M-F; 7a - 5p)  Please contact [facility] for night-coverage after hours (5p -7a ) and weekends on [email or web address]  Patient seen and examined with the above-signed Advanced Practice Provider and/or Housestaff. I personally reviewed laboratory data, imaging studies and relevant notes. I independently examined the patient and formulated the important aspects of the plan. I have edited the note to reflect any of my changes or salient points. I  have personally discussed the plan with the patient and/or family.  Denies CP or SOB. CBG 170-200  General:  Well appearing. No resp difficulty HEENT: normal Neck: supple. no JVD. Carotids 2+ bilat; no bruits. No lymphadenopathy or thryomegaly appreciated. Cor: PMI nondisplaced. Regular rate & rhythm. No rubs,  gallops or murmurs. Lungs: clear Abdomen: soft, nontender, nondistended. No hepatosplenomegaly. No bruits or masses. Good bowel sounds. Extremities: no cyanosis, clubbing, rash, edema Neuro: alert & orientedx3, cranial nerves grossly intact. moves all 4 extremities w/o difficulty. Affect pleasant  Stable from CAD and HF perspective. Blood sugars improving. On GDMT  For CABG  - 1st case in am.   Glori Bickers, MD  8:50 AM

## 2021-06-09 NOTE — Anesthesia Preprocedure Evaluation (Addendum)
Anesthesia Evaluation  Patient identified by MRN, date of birth, ID band Patient awake    Reviewed: Allergy & Precautions, NPO status , Patient's Chart, lab work & pertinent test results  Airway Mallampati: II  TM Distance: >3 FB Neck ROM: Full    Dental  (+) Edentulous Upper, Edentulous Lower, Dental Advisory Given   Pulmonary COPD, Current Smoker,    Pulmonary exam normal breath sounds clear to auscultation       Cardiovascular hypertension, Normal cardiovascular exam Rhythm:Regular Rate:Normal  Echo 06/06/2021 1. Left ventricular ejection fraction, by estimation, is 25 to 30%. The left ventricle has severely decreased function. The left ventricle demonstrates global hypokinesis. Left ventricular diastolic parameters are consistent with Grade I diastolic dysfunction (impaired relaxation).  2. Right ventricular systolic function is normal. The right ventricular size is normal.  3. The mitral valve is abnormal. Trivial mitral valve regurgitation.  4. The aortic valve is tricuspid. There is moderate calcification of the aortic valve. Aortic valve regurgitation is not visualized. Aortic valve sclerosis/calcification is present, without any evidence of aortic stenosis. Aortic valve mean gradient measures 4.0 mmHg.  5. The inferior vena cava is normal in size with greater than 50% respiratory variability, suggesting right atrial pressure of 3 mmHg.  6. Cannot exclude a small PFO.    Neuro/Psych negative neurological ROS     GI/Hepatic negative GI ROS, Neg liver ROS,   Endo/Other  negative endocrine ROSdiabetes  Renal/GU negative Renal ROS     Musculoskeletal negative musculoskeletal ROS (+)   Abdominal   Peds  Hematology negative hematology ROS (+)   Anesthesia Other Findings   Reproductive/Obstetrics                            Anesthesia Physical Anesthesia Plan  ASA: 4  Anesthesia Plan:  General   Post-op Pain Management: Minimal or no pain anticipated   Induction: Intravenous  PONV Risk Score and Plan: 2 and Treatment may vary due to age or medical condition and Midazolam  Airway Management Planned: Oral ETT  Additional Equipment: Arterial line, CVP, PA Cath, TEE and Ultrasound Guidance Line Placement  Intra-op Plan: Utilization Of Total Body Hypothermia per surgeon request  Post-operative Plan: Post-operative intubation/ventilation  Informed Consent: I have reviewed the patients History and Physical, chart, labs and discussed the procedure including the risks, benefits and alternatives for the proposed anesthesia with the patient or authorized representative who has indicated his/her understanding and acceptance.     Dental advisory given  Plan Discussed with: CRNA  Anesthesia Plan Comments:        Anesthesia Quick Evaluation

## 2021-06-09 NOTE — Progress Notes (Signed)
Pt states having lower back pain rates pain a lvl 5

## 2021-06-10 ENCOUNTER — Inpatient Hospital Stay (HOSPITAL_COMMUNITY): Payer: BC Managed Care – PPO

## 2021-06-10 ENCOUNTER — Inpatient Hospital Stay (HOSPITAL_COMMUNITY)
Disposition: A | Discharge status: Home / Self Care | Payer: Self-pay | Source: Other Acute Inpatient Hospital | Attending: Internal Medicine

## 2021-06-10 ENCOUNTER — Inpatient Hospital Stay (HOSPITAL_COMMUNITY): Payer: BC Managed Care – PPO | Admitting: Anesthesiology

## 2021-06-10 ENCOUNTER — Encounter (HOSPITAL_COMMUNITY): Payer: Self-pay | Admitting: Internal Medicine

## 2021-06-10 DIAGNOSIS — I2510 Atherosclerotic heart disease of native coronary artery without angina pectoris: Secondary | ICD-10-CM | POA: Diagnosis not present

## 2021-06-10 HISTORY — PX: CORONARY ARTERY BYPASS GRAFT: SHX141

## 2021-06-10 HISTORY — PX: ENDOVEIN HARVEST OF GREATER SAPHENOUS VEIN: SHX5059

## 2021-06-10 HISTORY — PX: TEE WITHOUT CARDIOVERSION: SHX5443

## 2021-06-10 LAB — URINALYSIS, ROUTINE W REFLEX MICROSCOPIC
Bacteria, UA: NONE SEEN
Bilirubin Urine: NEGATIVE
Glucose, UA: >=500 mg/dL — AB
Hgb urine dipstick: NEGATIVE
Ketones, ur: NEGATIVE mg/dL
Leukocytes,Ua: NEGATIVE
Nitrite: NEGATIVE
Protein, ur: 30 mg/dL — AB
Specific Gravity, Urine: 1.017 (ref 1.005–1.030)
pH: 5 (ref 5.0–8.0)

## 2021-06-10 LAB — POCT I-STAT 7, (LYTES, BLD GAS, ICA,H+H)
Acid-Base Excess: 0 mmol/L (ref 0.0–2.0)
Acid-Base Excess: 0 mmol/L (ref 0.0–2.0)
Acid-Base Excess: 0 mmol/L (ref 0.0–2.0)
Acid-Base Excess: 0 mmol/L (ref 0.0–2.0)
Acid-base deficit: 2 mmol/L (ref 0.0–2.0)
Acid-base deficit: 1 mmol/L (ref 0.0–2.0)
Acid-base deficit: 1 mmol/L (ref 0.0–2.0)
Acid-base deficit: 2 mmol/L (ref 0.0–2.0)
Acid-base deficit: 1 mmol/L (ref 0.0–2.0)
Acid-base deficit: 1 mmol/L (ref 0.0–2.0)
Acid-base deficit: 1 mmol/L (ref 0.0–2.0)
Bicarbonate: 24.4 mmol/L (ref 20.0–28.0)
Bicarbonate: 26.1 mmol/L (ref 20.0–28.0)
Bicarbonate: 23.8 mmol/L (ref 20.0–28.0)
Bicarbonate: 24.7 mmol/L (ref 20.0–28.0)
Bicarbonate: 23.1 mmol/L (ref 20.0–28.0)
Bicarbonate: 23.9 mmol/L (ref 20.0–28.0)
Bicarbonate: 25.1 mmol/L (ref 20.0–28.0)
Bicarbonate: 23.7 mmol/L (ref 20.0–28.0)
Bicarbonate: 24.6 mmol/L (ref 20.0–28.0)
Bicarbonate: 24.9 mmol/L (ref 20.0–28.0)
Bicarbonate: 23.9 mmol/L (ref 20.0–28.0)
Calcium, Ion: 1.25 mmol/L (ref 1.15–1.40)
Calcium, Ion: 0.98 mmol/L — ABNORMAL LOW (ref 1.15–1.40)
Calcium, Ion: 0.95 mmol/L — ABNORMAL LOW (ref 1.15–1.40)
Calcium, Ion: 1 mmol/L — ABNORMAL LOW (ref 1.15–1.40)
Calcium, Ion: 0.97 mmol/L — ABNORMAL LOW (ref 1.15–1.40)
Calcium, Ion: 0.98 mmol/L — ABNORMAL LOW (ref 1.15–1.40)
Calcium, Ion: 1.01 mmol/L — ABNORMAL LOW (ref 1.15–1.40)
Calcium, Ion: 1.12 mmol/L — ABNORMAL LOW (ref 1.15–1.40)
Calcium, Ion: 1.11 mmol/L — ABNORMAL LOW (ref 1.15–1.40)
Calcium, Ion: 1.15 mmol/L (ref 1.15–1.40)
Calcium, Ion: 1.12 mmol/L — ABNORMAL LOW (ref 1.15–1.40)
HCT: 42 % (ref 39.0–52.0)
HCT: 30 % — ABNORMAL LOW (ref 39.0–52.0)
HCT: 26 % — ABNORMAL LOW (ref 39.0–52.0)
HCT: 28 % — ABNORMAL LOW (ref 39.0–52.0)
HCT: 26 % — ABNORMAL LOW (ref 39.0–52.0)
HCT: 26 % — ABNORMAL LOW (ref 39.0–52.0)
HCT: 29 % — ABNORMAL LOW (ref 39.0–52.0)
HCT: 32 % — ABNORMAL LOW (ref 39.0–52.0)
HCT: 30 % — ABNORMAL LOW (ref 39.0–52.0)
HCT: 31 % — ABNORMAL LOW (ref 39.0–52.0)
HCT: 31 % — ABNORMAL LOW (ref 39.0–52.0)
Hemoglobin: 14.3 g/dL (ref 13.0–17.0)
Hemoglobin: 10.2 g/dL — ABNORMAL LOW (ref 13.0–17.0)
Hemoglobin: 8.8 g/dL — ABNORMAL LOW (ref 13.0–17.0)
Hemoglobin: 9.5 g/dL — ABNORMAL LOW (ref 13.0–17.0)
Hemoglobin: 8.8 g/dL — ABNORMAL LOW (ref 13.0–17.0)
Hemoglobin: 8.8 g/dL — ABNORMAL LOW (ref 13.0–17.0)
Hemoglobin: 9.9 g/dL — ABNORMAL LOW (ref 13.0–17.0)
Hemoglobin: 10.9 g/dL — ABNORMAL LOW (ref 13.0–17.0)
Hemoglobin: 10.2 g/dL — ABNORMAL LOW (ref 13.0–17.0)
Hemoglobin: 10.5 g/dL — ABNORMAL LOW (ref 13.0–17.0)
Hemoglobin: 10.5 g/dL — ABNORMAL LOW (ref 13.0–17.0)
O2 Saturation: 100 %
O2 Saturation: 100 %
O2 Saturation: 100 %
O2 Saturation: 100 %
O2 Saturation: 100 %
O2 Saturation: 99 %
O2 Saturation: 100 %
O2 Saturation: 99 %
O2 Saturation: 96 %
O2 Saturation: 97 %
O2 Saturation: 94 %
Patient temperature: 36.3
Patient temperature: 36.7
Patient temperature: 36.9
Potassium: 4.5 mmol/L (ref 3.5–5.1)
Potassium: 4.6 mmol/L (ref 3.5–5.1)
Potassium: 4.9 mmol/L (ref 3.5–5.1)
Potassium: 5.2 mmol/L — ABNORMAL HIGH (ref 3.5–5.1)
Potassium: 5 mmol/L (ref 3.5–5.1)
Potassium: 5.5 mmol/L — ABNORMAL HIGH (ref 3.5–5.1)
Potassium: 5.7 mmol/L — ABNORMAL HIGH (ref 3.5–5.1)
Potassium: 4.5 mmol/L (ref 3.5–5.1)
Potassium: 4.3 mmol/L (ref 3.5–5.1)
Potassium: 4.5 mmol/L (ref 3.5–5.1)
Potassium: 4.8 mmol/L (ref 3.5–5.1)
Sodium: 137 mmol/L (ref 135–145)
Sodium: 137 mmol/L (ref 135–145)
Sodium: 136 mmol/L (ref 135–145)
Sodium: 137 mmol/L (ref 135–145)
Sodium: 136 mmol/L (ref 135–145)
Sodium: 137 mmol/L (ref 135–145)
Sodium: 136 mmol/L (ref 135–145)
Sodium: 137 mmol/L (ref 135–145)
Sodium: 138 mmol/L (ref 135–145)
Sodium: 138 mmol/L (ref 135–145)
Sodium: 138 mmol/L (ref 135–145)
TCO2: 26 mmol/L (ref 22–32)
TCO2: 27 mmol/L (ref 22–32)
TCO2: 25 mmol/L (ref 22–32)
TCO2: 26 mmol/L (ref 22–32)
TCO2: 24 mmol/L (ref 22–32)
TCO2: 25 mmol/L (ref 22–32)
TCO2: 27 mmol/L (ref 22–32)
TCO2: 25 mmol/L (ref 22–32)
TCO2: 26 mmol/L (ref 22–32)
TCO2: 26 mmol/L (ref 22–32)
TCO2: 25 mmol/L (ref 22–32)
pCO2 arterial: 48.5 mmHg — ABNORMAL HIGH (ref 32.0–48.0)
pCO2 arterial: 46.1 mmHg (ref 32.0–48.0)
pCO2 arterial: 36 mmHg (ref 32.0–48.0)
pCO2 arterial: 37.5 mmHg (ref 32.0–48.0)
pCO2 arterial: 34.8 mmHg (ref 32.0–48.0)
pCO2 arterial: 36.9 mmHg (ref 32.0–48.0)
pCO2 arterial: 49.2 mmHg — ABNORMAL HIGH (ref 32.0–48.0)
pCO2 arterial: 41.3 mmHg (ref 32.0–48.0)
pCO2 arterial: 40.9 mmHg (ref 32.0–48.0)
pCO2 arterial: 45 mmHg (ref 32.0–48.0)
pCO2 arterial: 39.9 mmHg (ref 32.0–48.0)
pH, Arterial: 7.31 — ABNORMAL LOW (ref 7.350–7.450)
pH, Arterial: 7.361 (ref 7.350–7.450)
pH, Arterial: 7.427 (ref 7.350–7.450)
pH, Arterial: 7.428 (ref 7.350–7.450)
pH, Arterial: 7.419 (ref 7.350–7.450)
pH, Arterial: 7.43 (ref 7.350–7.450)
pH, Arterial: 7.315 — ABNORMAL LOW (ref 7.350–7.450)
pH, Arterial: 7.366 (ref 7.350–7.450)
pH, Arterial: 7.384 (ref 7.350–7.450)
pH, Arterial: 7.35 (ref 7.350–7.450)
pH, Arterial: 7.384 (ref 7.350–7.450)
pO2, Arterial: 354 mmHg — ABNORMAL HIGH (ref 83.0–108.0)
pO2, Arterial: 378 mmHg — ABNORMAL HIGH (ref 83.0–108.0)
pO2, Arterial: 244 mmHg — ABNORMAL HIGH (ref 83.0–108.0)
pO2, Arterial: 330 mmHg — ABNORMAL HIGH (ref 83.0–108.0)
pO2, Arterial: 132 mmHg — ABNORMAL HIGH (ref 83.0–108.0)
pO2, Arterial: 219 mmHg — ABNORMAL HIGH (ref 83.0–108.0)
pO2, Arterial: 241 mmHg — ABNORMAL HIGH (ref 83.0–108.0)
pO2, Arterial: 121 mmHg — ABNORMAL HIGH (ref 83.0–108.0)
pO2, Arterial: 81 mmHg — ABNORMAL LOW (ref 83.0–108.0)
pO2, Arterial: 98 mmHg (ref 83.0–108.0)
pO2, Arterial: 73 mmHg — ABNORMAL LOW (ref 83.0–108.0)

## 2021-06-10 LAB — POCT I-STAT, CHEM 8
BUN: 33 mg/dL — ABNORMAL HIGH (ref 6–20)
BUN: 27 mg/dL — ABNORMAL HIGH (ref 6–20)
BUN: 26 mg/dL — ABNORMAL HIGH (ref 6–20)
BUN: 27 mg/dL — ABNORMAL HIGH (ref 6–20)
BUN: 27 mg/dL — ABNORMAL HIGH (ref 6–20)
Calcium, Ion: 1.21 mmol/L (ref 1.15–1.40)
Calcium, Ion: 0.95 mmol/L — ABNORMAL LOW (ref 1.15–1.40)
Calcium, Ion: 0.98 mmol/L — ABNORMAL LOW (ref 1.15–1.40)
Calcium, Ion: 0.93 mmol/L — ABNORMAL LOW (ref 1.15–1.40)
Calcium, Ion: 1.14 mmol/L — ABNORMAL LOW (ref 1.15–1.40)
Chloride: 102 mmol/L (ref 98–111)
Chloride: 102 mmol/L (ref 98–111)
Chloride: 102 mmol/L (ref 98–111)
Chloride: 101 mmol/L (ref 98–111)
Chloride: 101 mmol/L (ref 98–111)
Creatinine, Ser: 1.1 mg/dL (ref 0.61–1.24)
Creatinine, Ser: 0.9 mg/dL (ref 0.61–1.24)
Creatinine, Ser: 0.9 mg/dL (ref 0.61–1.24)
Creatinine, Ser: 1 mg/dL (ref 0.61–1.24)
Creatinine, Ser: 0.9 mg/dL (ref 0.61–1.24)
Glucose, Bld: 166 mg/dL — ABNORMAL HIGH (ref 70–99)
Glucose, Bld: 80 mg/dL (ref 70–99)
Glucose, Bld: 96 mg/dL (ref 70–99)
Glucose, Bld: 125 mg/dL — ABNORMAL HIGH (ref 70–99)
Glucose, Bld: 155 mg/dL — ABNORMAL HIGH (ref 70–99)
HCT: 41 % (ref 39.0–52.0)
HCT: 27 % — ABNORMAL LOW (ref 39.0–52.0)
HCT: 30 % — ABNORMAL LOW (ref 39.0–52.0)
HCT: 28 % — ABNORMAL LOW (ref 39.0–52.0)
HCT: 31 % — ABNORMAL LOW (ref 39.0–52.0)
Hemoglobin: 13.9 g/dL (ref 13.0–17.0)
Hemoglobin: 9.2 g/dL — ABNORMAL LOW (ref 13.0–17.0)
Hemoglobin: 10.2 g/dL — ABNORMAL LOW (ref 13.0–17.0)
Hemoglobin: 9.5 g/dL — ABNORMAL LOW (ref 13.0–17.0)
Hemoglobin: 10.5 g/dL — ABNORMAL LOW (ref 13.0–17.0)
Potassium: 4.6 mmol/L (ref 3.5–5.1)
Potassium: 5.1 mmol/L (ref 3.5–5.1)
Potassium: 5 mmol/L (ref 3.5–5.1)
Potassium: 5.7 mmol/L — ABNORMAL HIGH (ref 3.5–5.1)
Potassium: 4.5 mmol/L (ref 3.5–5.1)
Sodium: 137 mmol/L (ref 135–145)
Sodium: 136 mmol/L (ref 135–145)
Sodium: 136 mmol/L (ref 135–145)
Sodium: 135 mmol/L (ref 135–145)
Sodium: 137 mmol/L (ref 135–145)
TCO2: 26 mmol/L (ref 22–32)
TCO2: 25 mmol/L (ref 22–32)
TCO2: 23 mmol/L (ref 22–32)
TCO2: 26 mmol/L (ref 22–32)
TCO2: 23 mmol/L (ref 22–32)

## 2021-06-10 LAB — CBC
HCT: 42.3 % (ref 39.0–52.0)
HCT: 30.3 % — ABNORMAL LOW (ref 39.0–52.0)
Hemoglobin: 13.9 g/dL (ref 13.0–17.0)
Hemoglobin: 10.1 g/dL — ABNORMAL LOW (ref 13.0–17.0)
MCH: 28 pg (ref 26.0–34.0)
MCH: 28.6 pg (ref 26.0–34.0)
MCHC: 32.9 g/dL (ref 30.0–36.0)
MCHC: 33.3 g/dL (ref 30.0–36.0)
MCV: 85.1 fL (ref 80.0–100.0)
MCV: 85.8 fL (ref 80.0–100.0)
Platelets: 282 10*3/uL (ref 150–400)
Platelets: 154 10*3/uL (ref 150–400)
RBC: 4.97 MIL/uL (ref 4.22–5.81)
RBC: 3.53 MIL/uL — ABNORMAL LOW (ref 4.22–5.81)
RDW: 13.2 % (ref 11.5–15.5)
RDW: 13.2 % (ref 11.5–15.5)
WBC: 8.2 10*3/uL (ref 4.0–10.5)
WBC: 10.9 10*3/uL — ABNORMAL HIGH (ref 4.0–10.5)
nRBC: 0 % (ref 0.0–0.2)
nRBC: 0 % (ref 0.0–0.2)

## 2021-06-10 LAB — POCT I-STAT EG7
Acid-base deficit: 1 mmol/L (ref 0.0–2.0)
Bicarbonate: 24.8 mmol/L (ref 20.0–28.0)
Calcium, Ion: 1.02 mmol/L — ABNORMAL LOW (ref 1.15–1.40)
HCT: 31 % — ABNORMAL LOW (ref 39.0–52.0)
Hemoglobin: 10.5 g/dL — ABNORMAL LOW (ref 13.0–17.0)
O2 Saturation: 86 %
Potassium: 4.4 mmol/L (ref 3.5–5.1)
Sodium: 138 mmol/L (ref 135–145)
TCO2: 26 mmol/L (ref 22–32)
pCO2, Ven: 45.7 mmHg (ref 44.0–60.0)
pH, Ven: 7.342 (ref 7.250–7.430)
pO2, Ven: 55 mmHg — ABNORMAL HIGH (ref 32.0–45.0)

## 2021-06-10 LAB — PROTIME-INR
INR: 1 (ref 0.8–1.2)
INR: 1.3 — ABNORMAL HIGH (ref 0.8–1.2)
Prothrombin Time: 13.6 seconds (ref 11.4–15.2)
Prothrombin Time: 16.4 seconds — ABNORMAL HIGH (ref 11.4–15.2)

## 2021-06-10 LAB — BASIC METABOLIC PANEL
Anion gap: 9 (ref 5–15)
BUN: 34 mg/dL — ABNORMAL HIGH (ref 6–20)
CO2: 24 mmol/L (ref 22–32)
Calcium: 9 mg/dL (ref 8.9–10.3)
Chloride: 101 mmol/L (ref 98–111)
Creatinine, Ser: 1.41 mg/dL — ABNORMAL HIGH (ref 0.61–1.24)
GFR, Estimated: 59 mL/min — ABNORMAL LOW (ref >60)
Glucose, Bld: 221 mg/dL — ABNORMAL HIGH (ref 70–99)
Potassium: 4.7 mmol/L (ref 3.5–5.1)
Sodium: 134 mmol/L — ABNORMAL LOW (ref 135–145)

## 2021-06-10 LAB — GLUCOSE, CAPILLARY
Glucose-Capillary: 134 mg/dL — ABNORMAL HIGH (ref 70–99)
Glucose-Capillary: 137 mg/dL — ABNORMAL HIGH (ref 70–99)
Glucose-Capillary: 146 mg/dL — ABNORMAL HIGH (ref 70–99)
Glucose-Capillary: 152 mg/dL — ABNORMAL HIGH (ref 70–99)
Glucose-Capillary: 155 mg/dL — ABNORMAL HIGH (ref 70–99)
Glucose-Capillary: 162 mg/dL — ABNORMAL HIGH (ref 70–99)
Glucose-Capillary: 185 mg/dL — ABNORMAL HIGH (ref 70–99)
Glucose-Capillary: 191 mg/dL — ABNORMAL HIGH (ref 70–99)

## 2021-06-10 LAB — PLATELET COUNT: Platelets: 194 10*3/uL (ref 150–400)

## 2021-06-10 LAB — APTT
aPTT: 36 seconds (ref 24–36)
aPTT: 30 seconds (ref 24–36)

## 2021-06-10 LAB — ECHO INTRAOPERATIVE TEE
AV Mean grad: 6 mmHg
Height: 68 in
Weight: 2656 oz

## 2021-06-10 LAB — HEMOGLOBIN AND HEMATOCRIT, BLOOD
HCT: 26.8 % — ABNORMAL LOW (ref 39.0–52.0)
Hemoglobin: 9 g/dL — ABNORMAL LOW (ref 13.0–17.0)

## 2021-06-10 SURGERY — CORONARY ARTERY BYPASS GRAFTING (CABG)
Anesthesia: General | Site: Leg Lower | Laterality: Right

## 2021-06-10 MED ORDER — DEXTROSE 50 % IV SOLN: 0.0000 mL | INTRAVENOUS | Status: DC | PRN

## 2021-06-10 MED ORDER — PROTAMINE SULFATE 10 MG/ML IV SOLN
INTRAVENOUS | Status: DC | PRN
  Administered 2021-06-10: 220 mg via INTRAVENOUS
  Administered 2021-06-10: 30 mg via INTRAVENOUS

## 2021-06-10 MED ORDER — LACTATED RINGERS IV SOLN: INTRAVENOUS | Status: DC | PRN

## 2021-06-10 MED ORDER — FAMOTIDINE IN NACL 20-0.9 MG/50ML-% IV SOLN
20.0000 mg | Freq: Two times a day (BID) | INTRAVENOUS | Status: AC
  Administered 2021-06-10 (×2): 20 mg via INTRAVENOUS
  Filled 2021-06-10 (×2): qty 50

## 2021-06-10 MED ORDER — LACTATED RINGERS IV SOLN
INTRAVENOUS | Status: DC | PRN
Start: 2021-06-10 — End: 2021-06-10

## 2021-06-10 MED ORDER — PANTOPRAZOLE SODIUM 40 MG PO TBEC
40.0000 mg | DELAYED_RELEASE_TABLET | Freq: Every day | ORAL | Status: DC
  Administered 2021-06-12 – 2021-06-18 (×7): 40 mg via ORAL
  Filled 2021-06-10 (×7): qty 1

## 2021-06-10 MED ORDER — MORPHINE SULFATE (PF) 2 MG/ML IV SOLN
1.0000 mg | INTRAVENOUS | Status: DC | PRN
  Administered 2021-06-10: 4 mg via INTRAVENOUS
  Administered 2021-06-10: 2 mg via INTRAVENOUS
  Administered 2021-06-11 (×5): 4 mg via INTRAVENOUS
  Filled 2021-06-10: qty 2
  Filled 2021-06-10: qty 1
  Filled 2021-06-10 (×5): qty 2

## 2021-06-10 MED ORDER — SODIUM CHLORIDE 0.9 % IV SOLN
250.0000 mL | INTRAVENOUS | Status: DC
  Administered 2021-06-11: 250 mL via INTRAVENOUS

## 2021-06-10 MED ORDER — PROTAMINE SULFATE 10 MG/ML IV SOLN
INTRAVENOUS | Status: AC
  Filled 2021-06-10: qty 5

## 2021-06-10 MED ORDER — VANCOMYCIN HCL IN DEXTROSE 1-5 GM/200ML-% IV SOLN
1000.0000 mg | Freq: Once | INTRAVENOUS | Status: AC
  Administered 2021-06-10: 1000 mg via INTRAVENOUS
  Filled 2021-06-10: qty 200

## 2021-06-10 MED ORDER — ASPIRIN 81 MG PO CHEW: 324.0000 mg | CHEWABLE_TABLET | Freq: Every day | ORAL | Status: DC

## 2021-06-10 MED ORDER — ALBUMIN HUMAN 5 % IV SOLN: 250.0000 mL | INTRAVENOUS | Status: AC | PRN

## 2021-06-10 MED ORDER — LACTATED RINGERS IV SOLN: INTRAVENOUS | Status: DC

## 2021-06-10 MED ORDER — PHENYLEPHRINE 40 MCG/ML (10ML) SYRINGE FOR IV PUSH (FOR BLOOD PRESSURE SUPPORT)
PREFILLED_SYRINGE | INTRAVENOUS | Status: DC | PRN
  Administered 2021-06-10 (×2): 40 ug via INTRAVENOUS

## 2021-06-10 MED ORDER — ACETAMINOPHEN 500 MG PO TABS
1000.0000 mg | ORAL_TABLET | Freq: Four times a day (QID) | ORAL | Status: AC
  Administered 2021-06-10 – 2021-06-15 (×19): 1000 mg via ORAL
  Filled 2021-06-10 (×19): qty 2

## 2021-06-10 MED ORDER — FENTANYL CITRATE (PF) 250 MCG/5ML IJ SOLN
INTRAMUSCULAR | Status: AC
  Filled 2021-06-10: qty 5

## 2021-06-10 MED ORDER — CHLORHEXIDINE GLUCONATE CLOTH 2 % EX PADS
6.0000 | MEDICATED_PAD | Freq: Every day | CUTANEOUS | Status: DC
  Administered 2021-06-10 – 2021-06-17 (×7): 6 via TOPICAL

## 2021-06-10 MED ORDER — SODIUM CHLORIDE 0.9% IV SOLUTION: Freq: Once | INTRAVENOUS | Status: DC

## 2021-06-10 MED ORDER — SODIUM CHLORIDE 0.9% FLUSH: 3.0000 mL | INTRAVENOUS | Status: DC | PRN

## 2021-06-10 MED ORDER — DEXMEDETOMIDINE HCL IN NACL 400 MCG/100ML IV SOLN
0.0000 ug/kg/h | INTRAVENOUS | Status: DC
  Administered 2021-06-10: 0.07 ug/kg/h via INTRAVENOUS

## 2021-06-10 MED ORDER — LIDOCAINE 2% (20 MG/ML) 5 ML SYRINGE
INTRAMUSCULAR | Status: AC
  Filled 2021-06-10: qty 5

## 2021-06-10 MED ORDER — METOPROLOL TARTRATE 25 MG/10 ML ORAL SUSPENSION: 12.5000 mg | Freq: Two times a day (BID) | ORAL | Status: DC

## 2021-06-10 MED ORDER — ROCURONIUM BROMIDE 10 MG/ML (PF) SYRINGE
PREFILLED_SYRINGE | INTRAVENOUS | Status: AC
  Filled 2021-06-10: qty 10

## 2021-06-10 MED ORDER — ALBUMIN HUMAN 5 % IV SOLN
INTRAVENOUS | Status: AC
  Administered 2021-06-10: 12.5 g
  Filled 2021-06-10: qty 250

## 2021-06-10 MED ORDER — NITROGLYCERIN IN D5W 200-5 MCG/ML-% IV SOLN: 0.0000 ug/min | INTRAVENOUS | Status: DC

## 2021-06-10 MED ORDER — PROPOFOL 10 MG/ML IV BOLUS
INTRAVENOUS | Status: AC
  Filled 2021-06-10: qty 20

## 2021-06-10 MED ORDER — MIDAZOLAM HCL (PF) 5 MG/ML IJ SOLN
INTRAMUSCULAR | Status: DC | PRN
  Administered 2021-06-10: 2 mg via INTRAVENOUS
  Administered 2021-06-10: 1 mg via INTRAVENOUS
  Administered 2021-06-10: 4 mg via INTRAVENOUS
  Administered 2021-06-10: 5 mg via INTRAVENOUS

## 2021-06-10 MED ORDER — HEPARIN SODIUM (PORCINE) 1000 UNIT/ML IJ SOLN
INTRAMUSCULAR | Status: DC | PRN
  Administered 2021-06-10: 22000 [IU] via INTRAVENOUS
  Administered 2021-06-10: 3000 [IU] via INTRAVENOUS

## 2021-06-10 MED ORDER — MILRINONE LACTATE IN DEXTROSE 20-5 MG/100ML-% IV SOLN
0.1000 ug/kg/min | INTRAVENOUS | Status: DC
  Administered 2021-06-10: 0.3 ug/kg/min via INTRAVENOUS
  Administered 2021-06-11: 14:00:00 0.2 ug/kg/min via INTRAVENOUS
  Administered 2021-06-12 – 2021-06-14 (×3): 0.1 ug/kg/min via INTRAVENOUS
  Administered 2021-06-15 – 2021-06-16 (×2): 0.2 ug/kg/min via INTRAVENOUS
  Filled 2021-06-10 (×7): qty 100

## 2021-06-10 MED ORDER — HEMOSTATIC AGENTS (NO CHARGE) OPTIME
TOPICAL | Status: DC | PRN
Start: 2021-06-10 — End: 2021-06-10
  Administered 2021-06-10: 1 via TOPICAL

## 2021-06-10 MED ORDER — MIDAZOLAM HCL 2 MG/2ML IJ SOLN
INTRAMUSCULAR | Status: AC
  Filled 2021-06-10: qty 2

## 2021-06-10 MED ORDER — OXYCODONE HCL 5 MG PO TABS
5.0000 mg | ORAL_TABLET | ORAL | Status: DC | PRN
  Administered 2021-06-11: 5 mg via ORAL
  Administered 2021-06-11: 10 mg via ORAL
  Administered 2021-06-11: 5 mg via ORAL
  Administered 2021-06-12: 10 mg via ORAL
  Administered 2021-06-12: 5 mg via ORAL
  Administered 2021-06-12 – 2021-06-14 (×6): 10 mg via ORAL
  Filled 2021-06-10 (×3): qty 2
  Filled 2021-06-10: qty 1
  Filled 2021-06-10 (×3): qty 2
  Filled 2021-06-10: qty 1
  Filled 2021-06-10 (×2): qty 2
  Filled 2021-06-10: qty 1

## 2021-06-10 MED ORDER — BISACODYL 10 MG RE SUPP: 10.0000 mg | Freq: Every day | RECTAL | Status: DC

## 2021-06-10 MED ORDER — ASPIRIN EC 325 MG PO TBEC
325.0000 mg | DELAYED_RELEASE_TABLET | Freq: Every day | ORAL | Status: DC
  Administered 2021-06-11 – 2021-06-18 (×8): 325 mg via ORAL
  Filled 2021-06-10 (×8): qty 1

## 2021-06-10 MED ORDER — TRAMADOL HCL 50 MG PO TABS
50.0000 mg | ORAL_TABLET | ORAL | Status: DC | PRN
  Administered 2021-06-11 – 2021-06-15 (×10): 100 mg via ORAL
  Administered 2021-06-16 (×3): 50 mg via ORAL
  Administered 2021-06-16 – 2021-06-17 (×2): 100 mg via ORAL
  Filled 2021-06-10: qty 1
  Filled 2021-06-10 (×4): qty 2
  Filled 2021-06-10: qty 1
  Filled 2021-06-10 (×9): qty 2

## 2021-06-10 MED ORDER — METOCLOPRAMIDE HCL 5 MG/ML IJ SOLN
10.0000 mg | Freq: Four times a day (QID) | INTRAMUSCULAR | Status: AC
  Administered 2021-06-10 – 2021-06-11 (×4): 10 mg via INTRAVENOUS
  Filled 2021-06-10 (×4): qty 2

## 2021-06-10 MED ORDER — MIDAZOLAM HCL (PF) 10 MG/2ML IJ SOLN
INTRAMUSCULAR | Status: AC
  Filled 2021-06-10: qty 2

## 2021-06-10 MED ORDER — PROTAMINE SULFATE 10 MG/ML IV SOLN
INTRAVENOUS | Status: AC
  Filled 2021-06-10: qty 25

## 2021-06-10 MED ORDER — SODIUM CHLORIDE 0.45 % IV SOLN: INTRAVENOUS | Status: DC | PRN

## 2021-06-10 MED ORDER — PHENYLEPHRINE 40 MCG/ML (10ML) SYRINGE FOR IV PUSH (FOR BLOOD PRESSURE SUPPORT)
PREFILLED_SYRINGE | INTRAVENOUS | Status: AC
  Filled 2021-06-10: qty 10

## 2021-06-10 MED ORDER — NOREPINEPHRINE 4 MG/250ML-% IV SOLN: 0.0000 ug/min | INTRAVENOUS | Status: DC

## 2021-06-10 MED ORDER — LACTATED RINGERS IV SOLN
500.0000 mL | Freq: Once | INTRAVENOUS | Status: AC | PRN
  Administered 2021-06-11: 500 mL via INTRAVENOUS

## 2021-06-10 MED ORDER — PHENYLEPHRINE HCL-NACL 20-0.9 MG/250ML-% IV SOLN: 0.0000 ug/min | INTRAVENOUS | Status: DC

## 2021-06-10 MED ORDER — ONDANSETRON HCL 4 MG/2ML IJ SOLN
4.0000 mg | Freq: Four times a day (QID) | INTRAMUSCULAR | Status: DC | PRN
  Administered 2021-06-14 – 2021-06-17 (×5): 4 mg via INTRAVENOUS
  Filled 2021-06-10 (×7): qty 2

## 2021-06-10 MED ORDER — ACETAMINOPHEN 160 MG/5ML PO SOLN: 650.0000 mg | Freq: Once | ORAL | Status: AC

## 2021-06-10 MED ORDER — SODIUM CHLORIDE 0.9 % IV SOLN: INTRAVENOUS | Status: DC | PRN

## 2021-06-10 MED ORDER — METOPROLOL TARTRATE 12.5 MG HALF TABLET
12.5000 mg | ORAL_TABLET | Freq: Two times a day (BID) | ORAL | Status: DC
  Administered 2021-06-11: 12.5 mg via ORAL
  Filled 2021-06-10: qty 1

## 2021-06-10 MED ORDER — CHLORHEXIDINE GLUCONATE 0.12 % MT SOLN
15.0000 mL | OROMUCOSAL | Status: AC
  Administered 2021-06-10: 15 mL via OROMUCOSAL

## 2021-06-10 MED ORDER — HEMOSTATIC AGENTS (NO CHARGE) OPTIME
TOPICAL | Status: DC | PRN
Start: 2021-06-10 — End: 2021-06-10
  Administered 2021-06-10 (×3): 1 via TOPICAL

## 2021-06-10 MED ORDER — FENTANYL CITRATE (PF) 250 MCG/5ML IJ SOLN
INTRAMUSCULAR | Status: DC | PRN
  Administered 2021-06-10: 50 ug via INTRAVENOUS
  Administered 2021-06-10: 100 ug via INTRAVENOUS
  Administered 2021-06-10 (×2): 150 ug via INTRAVENOUS
  Administered 2021-06-10: 300 ug via INTRAVENOUS

## 2021-06-10 MED ORDER — SUCCINYLCHOLINE CHLORIDE 200 MG/10ML IV SOSY
PREFILLED_SYRINGE | INTRAVENOUS | Status: AC
  Filled 2021-06-10: qty 10

## 2021-06-10 MED ORDER — EPINEPHRINE HCL 5 MG/250ML IV SOLN IN NS: 0.0000 ug/min | INTRAVENOUS | Status: DC

## 2021-06-10 MED ORDER — HEPARIN SODIUM (PORCINE) 1000 UNIT/ML IJ SOLN
INTRAMUSCULAR | Status: AC
  Filled 2021-06-10: qty 1

## 2021-06-10 MED ORDER — POTASSIUM CHLORIDE 10 MEQ/50ML IV SOLN: 10.0000 meq | INTRAVENOUS | Status: AC

## 2021-06-10 MED ORDER — SODIUM CHLORIDE 0.9 % IV SOLN: INTRAVENOUS | Status: DC

## 2021-06-10 MED ORDER — 0.9 % SODIUM CHLORIDE (POUR BTL) OPTIME
TOPICAL | Status: DC | PRN
  Administered 2021-06-10 (×4): 1000 mL

## 2021-06-10 MED ORDER — PLASMA-LYTE A IV SOLN
INTRAVENOUS | Status: DC | PRN
  Administered 2021-06-10: 500 mL via INTRAVASCULAR

## 2021-06-10 MED ORDER — METOPROLOL TARTRATE 5 MG/5ML IV SOLN
2.5000 mg | INTRAVENOUS | Status: DC | PRN
  Administered 2021-06-11 (×2): 2.5 mg via INTRAVENOUS
  Filled 2021-06-10 (×2): qty 5

## 2021-06-10 MED ORDER — ACETAMINOPHEN 650 MG RE SUPP
650.0000 mg | Freq: Once | RECTAL | Status: AC
  Administered 2021-06-10: 650 mg via RECTAL

## 2021-06-10 MED ORDER — PROPOFOL 10 MG/ML IV BOLUS
INTRAVENOUS | Status: DC | PRN
  Administered 2021-06-10: 20 mg via INTRAVENOUS
  Administered 2021-06-10: 40 mg via INTRAVENOUS

## 2021-06-10 MED ORDER — SODIUM CHLORIDE 0.9% FLUSH
3.0000 mL | Freq: Two times a day (BID) | INTRAVENOUS | Status: DC
  Administered 2021-06-11 – 2021-06-17 (×10): 3 mL via INTRAVENOUS

## 2021-06-10 MED ORDER — CEFAZOLIN SODIUM-DEXTROSE 2-4 GM/100ML-% IV SOLN
2.0000 g | Freq: Three times a day (TID) | INTRAVENOUS | Status: AC
  Administered 2021-06-10 – 2021-06-12 (×6): 2 g via INTRAVENOUS
  Filled 2021-06-10 (×6): qty 100

## 2021-06-10 MED ORDER — ACETAMINOPHEN 160 MG/5ML PO SOLN: 1000.0000 mg | Freq: Four times a day (QID) | ORAL | Status: AC

## 2021-06-10 MED ORDER — ROCURONIUM BROMIDE 10 MG/ML (PF) SYRINGE
PREFILLED_SYRINGE | INTRAVENOUS | Status: DC | PRN
  Administered 2021-06-10 (×2): 50 mg via INTRAVENOUS
  Administered 2021-06-10: 100 mg via INTRAVENOUS
  Administered 2021-06-10 (×2): 50 mg via INTRAVENOUS

## 2021-06-10 MED ORDER — SODIUM CHLORIDE (PF) 0.9 % IJ SOLN
OROMUCOSAL | Status: DC | PRN
  Administered 2021-06-10 (×3): 4 mL via TOPICAL

## 2021-06-10 MED ORDER — ARTIFICIAL TEARS OPHTHALMIC OINT
TOPICAL_OINTMENT | OPHTHALMIC | Status: AC
  Filled 2021-06-10: qty 3.5

## 2021-06-10 MED ORDER — BISACODYL 5 MG PO TBEC
10.0000 mg | DELAYED_RELEASE_TABLET | Freq: Every day | ORAL | Status: DC
  Administered 2021-06-11 – 2021-06-18 (×7): 10 mg via ORAL
  Filled 2021-06-10 (×8): qty 2

## 2021-06-10 MED ORDER — DOCUSATE SODIUM 100 MG PO CAPS
200.0000 mg | ORAL_CAPSULE | Freq: Every day | ORAL | Status: DC
  Administered 2021-06-11 – 2021-06-18 (×7): 200 mg via ORAL
  Filled 2021-06-10 (×8): qty 2

## 2021-06-10 MED ORDER — MIDAZOLAM HCL 2 MG/2ML IJ SOLN: 2.0000 mg | INTRAMUSCULAR | Status: DC | PRN

## 2021-06-10 MED ORDER — MAGNESIUM SULFATE 4 GM/100ML IV SOLN
4.0000 g | Freq: Once | INTRAVENOUS | Status: AC
  Administered 2021-06-10: 4 g via INTRAVENOUS
  Filled 2021-06-10: qty 100

## 2021-06-10 MED ORDER — INSULIN REGULAR(HUMAN) IN NACL 100-0.9 UT/100ML-% IV SOLN: INTRAVENOUS | Status: DC

## 2021-06-10 SURGICAL SUPPLY — 122 items
ADAPTER CARDIO PERF ANTE/RETRO (ADAPTER) ×4 IMPLANT
APPLICATOR COTTON TIP 6 STRL (MISCELLANEOUS) IMPLANT
APPLICATOR COTTON TIP 6IN STRL (MISCELLANEOUS) ×4
APPLICATOR TIP COSEAL (VASCULAR PRODUCTS) IMPLANT
BAG DECANTER FOR FLEXI CONT (MISCELLANEOUS) ×4 IMPLANT
BLADE CLIPPER SURG (BLADE) ×4 IMPLANT
BLADE STERNUM SYSTEM 6 (BLADE) ×4 IMPLANT
BLADE SURG 11 STRL SS (BLADE) ×1 IMPLANT
BLADE SURG 12 STRL SS (BLADE) ×4 IMPLANT
BNDG ELASTIC 2X5.8 VLCR STR LF (GAUZE/BANDAGES/DRESSINGS) ×1 IMPLANT
BNDG ELASTIC 4X5.8 VLCR STR LF (GAUZE/BANDAGES/DRESSINGS) ×4 IMPLANT
BNDG ELASTIC 6X10 VLCR STRL LF (GAUZE/BANDAGES/DRESSINGS) ×2 IMPLANT
BNDG ELASTIC 6X5.8 VLCR STR LF (GAUZE/BANDAGES/DRESSINGS) ×4 IMPLANT
BNDG GAUZE ELAST 4 BULKY (GAUZE/BANDAGES/DRESSINGS) ×5 IMPLANT
CANISTER SUCT 3000ML PPV (MISCELLANEOUS) ×4 IMPLANT
CANNULA GUNDRY RCSP 15FR (MISCELLANEOUS) ×4 IMPLANT
CANNULA VESSEL 3MM BLUNT TIP (CANNULA) ×1 IMPLANT
CATH CPB KIT VANTRIGT (MISCELLANEOUS) ×4 IMPLANT
CATH DIAG EXPO 6F AL1 (CATHETERS) ×3 IMPLANT
CATH INFINITI 6F MPB2 (CATHETERS) ×3 IMPLANT
CATH ROBINSON RED A/P 18FR (CATHETERS) ×12 IMPLANT
CATH THORACIC 28FR RT ANG (CATHETERS) ×6 IMPLANT
CLIP FOGARTY SPRING 6M (CLIP) ×1 IMPLANT
CLIP RETRACTION 3.0MM CORONARY (MISCELLANEOUS) ×1 IMPLANT
CONTAINER PROTECT SURGISLUSH (MISCELLANEOUS) ×3 IMPLANT
DEFOGGER ANTIFOG KIT (MISCELLANEOUS) ×1 IMPLANT
DERMABOND ADVANCED (GAUZE/BANDAGES/DRESSINGS) ×2
DERMABOND ADVANCED .7 DNX12 (GAUZE/BANDAGES/DRESSINGS) IMPLANT
DRAIN CHANNEL 32F RND 10.7 FF (WOUND CARE) ×4 IMPLANT
DRAPE C-ARM 42X72 X-RAY (DRAPES) ×6 IMPLANT
DRAPE CARDIOVASCULAR INCISE (DRAPES) ×1
DRAPE CV SPLIT W-CLR ANES SCRN (DRAPES) ×3 IMPLANT
DRAPE PERI GROIN 82X75IN TIB (DRAPES) ×3 IMPLANT
DRAPE SLUSH/WARMER DISC (DRAPES) ×3 IMPLANT
DRAPE SRG 135X102X78XABS (DRAPES) ×3 IMPLANT
DRSG AQUACEL AG ADV 3.5X14 (GAUZE/BANDAGES/DRESSINGS) ×4 IMPLANT
ELECT BLADE 4.0 EZ CLEAN MEGAD (MISCELLANEOUS) ×4
ELECT BLADE 6.5 EXT (BLADE) ×4 IMPLANT
ELECT CAUTERY BLADE 6.4 (BLADE) ×4 IMPLANT
ELECT REM PT RETURN 9FT ADLT (ELECTROSURGICAL) ×4
ELECTRODE BLDE 4.0 EZ CLN MEGD (MISCELLANEOUS) ×3 IMPLANT
ELECTRODE REM PT RTRN 9FT ADLT (ELECTROSURGICAL) ×6 IMPLANT
FELT TEFLON 1X6 (MISCELLANEOUS) ×7 IMPLANT
GAUZE 4X4 16PLY ~~LOC~~+RFID DBL (SPONGE) ×4 IMPLANT
GAUZE SPONGE 4X4 12PLY STRL (GAUZE/BANDAGES/DRESSINGS) ×9 IMPLANT
GLOVE SURG ENC MOIS LTX SZ7.5 (GLOVE) ×12 IMPLANT
GLOVE SURG ENC TEXT LTX SZ7.5 (GLOVE) ×12 IMPLANT
GLOVE SURG GAMMEX LF SZ7 (GLOVE) ×2 IMPLANT
GLOVE SURG MICRO LTX SZ6 (GLOVE) ×5 IMPLANT
GLOVE SURG UNDER POLY LF SZ6 (GLOVE) ×1 IMPLANT
GOWN STRL REUS W/ TWL LRG LVL3 (GOWN DISPOSABLE) ×12 IMPLANT
GOWN STRL REUS W/TWL LRG LVL3 (GOWN DISPOSABLE) ×6
HEMOSTAT POWDER SURGIFOAM 1G (HEMOSTASIS) ×12 IMPLANT
HEMOSTAT SURGICEL 2X14 (HEMOSTASIS) ×4 IMPLANT
INSERT FOGARTY SM (MISCELLANEOUS) ×3 IMPLANT
INSERT FOGARTY XLG (MISCELLANEOUS) IMPLANT
KIT BASIN OR (CUSTOM PROCEDURE TRAY) ×4 IMPLANT
KIT SUCTION CATH 14FR (SUCTIONS) ×4 IMPLANT
KIT TURNOVER KIT B (KITS) ×4 IMPLANT
KIT VASOVIEW HEMOPRO 2 VH 4000 (KITS) ×4 IMPLANT
LEAD PACING MYOCARDI (MISCELLANEOUS) ×4 IMPLANT
LOOP VESSEL MINI RED (MISCELLANEOUS) ×3 IMPLANT
MARKER GRAFT CORONARY BYPASS (MISCELLANEOUS) ×12 IMPLANT
NDL SUT 4 .5 CRC FRENCH EYE (NEEDLE) IMPLANT
NEEDLE FRENCH EYE (NEEDLE) ×1
NS IRRIG 1000ML POUR BTL (IV SOLUTION) ×20 IMPLANT
PACK CHEST (CUSTOM PROCEDURE TRAY) ×3 IMPLANT
PACK E OPEN HEART (SUTURE) ×4 IMPLANT
PACK OPEN HEART (CUSTOM PROCEDURE TRAY) ×4 IMPLANT
PAD ARMBOARD 7.5X6 YLW CONV (MISCELLANEOUS) ×8 IMPLANT
PAD ELECT DEFIB RADIOL ZOLL (MISCELLANEOUS) ×4 IMPLANT
PENCIL BUTTON HOLSTER BLD 10FT (ELECTRODE) ×4 IMPLANT
POSITIONER HEAD DONUT 9IN (MISCELLANEOUS) ×4 IMPLANT
POWDER SURGICEL 3.0 GRAM (HEMOSTASIS) ×1 IMPLANT
PUMP SET IMPELLA 5.5 US (CATHETERS) ×3 IMPLANT
PUNCH AORTIC ROTATE 4.0MM (MISCELLANEOUS) IMPLANT
PUNCH AORTIC ROTATE 4.5MM 8IN (MISCELLANEOUS) ×1 IMPLANT
PUNCH AORTIC ROTATE 5MM 8IN (MISCELLANEOUS) IMPLANT
SEALANT SURG COSEAL 8ML (VASCULAR PRODUCTS) ×3 IMPLANT
SET MPS 3-ND DEL (MISCELLANEOUS) ×1 IMPLANT
SPONGE T-LAP 18X18 ~~LOC~~+RFID (SPONGE) ×17 IMPLANT
SPONGE T-LAP 4X18 ~~LOC~~+RFID (SPONGE) ×4 IMPLANT
STRIP CLOSURE SKIN 1/2X4 (GAUZE/BANDAGES/DRESSINGS) ×1 IMPLANT
SUPPORT HEART JANKE-BARRON (MISCELLANEOUS) ×4 IMPLANT
SURGIFLO W/THROMBIN 8M KIT (HEMOSTASIS) ×4 IMPLANT
SUT BONE WAX W31G (SUTURE) ×4 IMPLANT
SUT ETHILON 3 0 PS 1 (SUTURE) ×8 IMPLANT
SUT MNCRL AB 4-0 PS2 18 (SUTURE) ×1 IMPLANT
SUT PROLENE 3 0 SH DA (SUTURE) IMPLANT
SUT PROLENE 3 0 SH1 36 (SUTURE) IMPLANT
SUT PROLENE 4 0 RB 1 (SUTURE) ×1
SUT PROLENE 4 0 SH DA (SUTURE) ×5 IMPLANT
SUT PROLENE 4-0 RB1 .5 CRCL 36 (SUTURE) ×3 IMPLANT
SUT PROLENE 5 0 C 1 36 (SUTURE) IMPLANT
SUT PROLENE 6 0 C 1 30 (SUTURE) ×3 IMPLANT
SUT PROLENE 6 0 CC (SUTURE) ×12 IMPLANT
SUT PROLENE 8 0 BV175 6 (SUTURE) ×2 IMPLANT
SUT PROLENE BLUE 7 0 (SUTURE) ×6 IMPLANT
SUT SILK  1 MH (SUTURE) ×4
SUT SILK 1 MH (SUTURE) ×12 IMPLANT
SUT SILK 1 TIES 10X30 (SUTURE) ×3 IMPLANT
SUT SILK 2 0 SH CR/8 (SUTURE) ×1 IMPLANT
SUT SILK 3 0 SH CR/8 (SUTURE) IMPLANT
SUT STEEL 6MS V (SUTURE) ×8 IMPLANT
SUT STEEL SZ 6 DBL 3X14 BALL (SUTURE) ×4 IMPLANT
SUT TEM PAC WIRE 2 0 SH (SUTURE) ×1 IMPLANT
SUT VIC AB 1 CTX 36 (SUTURE) ×2
SUT VIC AB 1 CTX36XBRD ANBCTR (SUTURE) ×6 IMPLANT
SUT VIC AB 2-0 CT1 27 (SUTURE) ×1
SUT VIC AB 2-0 CT1 TAPERPNT 27 (SUTURE) IMPLANT
SUT VIC AB 2-0 CTX 27 (SUTURE) IMPLANT
SUT VIC AB 3-0 X1 27 (SUTURE) IMPLANT
SYSTEM SAHARA CHEST DRAIN ATS (WOUND CARE) ×4 IMPLANT
TAPE CLOTH SURG 4X10 WHT LF (GAUZE/BANDAGES/DRESSINGS) ×1 IMPLANT
TAPE PAPER 2X10 WHT MICROPORE (GAUZE/BANDAGES/DRESSINGS) ×1 IMPLANT
TOWEL GREEN STERILE (TOWEL DISPOSABLE) ×4 IMPLANT
TOWEL GREEN STERILE FF (TOWEL DISPOSABLE) ×5 IMPLANT
TRAY FOLEY SLVR 16FR TEMP STAT (SET/KITS/TRAYS/PACK) ×4 IMPLANT
TUBE SUCT INTRACARD DLP 20F (MISCELLANEOUS) ×1 IMPLANT
TUBING LAP HI FLOW INSUFFLATIO (TUBING) ×3 IMPLANT
UNDERPAD 30X36 HEAVY ABSORB (UNDERPADS AND DIAPERS) ×4 IMPLANT
WATER STERILE IRR 1000ML POUR (IV SOLUTION) ×8 IMPLANT

## 2021-06-10 NOTE — Progress Notes (Signed)
CT Surgery  Patient stable after CABG for ischemic cardiomyopathy and low EF Hemodynamics stable on milrinone, sinus rhythm Proceed with standard vent wean protocol and continue milrinone postop  Vitals:   06/10/21 1545 06/10/21 1615  BP:    Pulse: 79   Resp: 16   Temp: (!) 97.5 F (36.4 C)   SpO2: 99% 98%

## 2021-06-10 NOTE — Anesthesia Procedure Notes (Signed)
Central Venous Catheter Insertion Performed by: Nolon Nations, MD, anesthesiologist Start/End1/20/2023 7:15 AM, 06/10/2021 7:23 AM Patient location: Pre-op. Preanesthetic checklist: patient identified, IV checked, site marked, risks and benefits discussed, surgical consent, monitors and equipment checked, pre-op evaluation, timeout performed and anesthesia consent Position: Trendelenburg Hand hygiene performed  and maximum sterile barriers used  PA cath was placed.Swan type:thermodilution PA Cath depth:48 Procedure performed without using ultrasound guided technique. Attempts: 1 Patient tolerated the procedure well with no immediate complications.

## 2021-06-10 NOTE — Progress Notes (Signed)
Pre Procedure note for inpatients:   Kenneth Bailey has been scheduled for Procedure(s): CORONARY ARTERY BYPASS GRAFTING (CABG) (N/A) PLACEMENT OF IMPELLA LEFT VENTRICULAR ASSIST DEVICE (N/A) TRANSESOPHAGEAL ECHOCARDIOGRAM (TEE) (N/A) today. The various methods of treatment have been discussed with the patient. After consideration of the risks, benefits and treatment options the patient has consented to the planned procedure.   The patient has been seen and labs reviewed. There are no changes in the patients condition to prevent proceeding with the planned procedure today.  Recent labs:  Lab Results  Component Value Date   WBC 8.2 06/10/2021   HGB 13.9 06/10/2021   HCT 42.3 06/10/2021   PLT 282 06/10/2021   GLUCOSE 221 (H) 06/10/2021   CHOL 241 (H) 06/07/2021   TRIG 284 (H) 06/07/2021   HDL 29 (L) 06/07/2021   LDLCALC 155 (H) 06/07/2021   ALT 23 06/06/2021   AST 18 06/06/2021   NA 134 (L) 06/10/2021   K 4.7 06/10/2021   CL 101 06/10/2021   CREATININE 1.41 (H) 06/10/2021   BUN 34 (H) 06/10/2021   CO2 24 06/10/2021   TSH 2.153 06/07/2021   INR 1.0 06/10/2021   HGBA1C 10.7 (H) 06/07/2021    Dahlia Byes, MD 06/10/2021 7:23 AM

## 2021-06-10 NOTE — Anesthesia Procedure Notes (Signed)
Central Venous Catheter Insertion Performed by: Nolon Nations, MD, anesthesiologist Start/End1/20/2023 6:55 AM, 06/10/2021 7:23 AM Patient location: Pre-op. Preanesthetic checklist: patient identified, IV checked, site marked, risks and benefits discussed, surgical consent, monitors and equipment checked, pre-op evaluation, timeout performed and anesthesia consent Position: Trendelenburg Lidocaine 1% used for infiltration and patient sedated Hand hygiene performed  and maximum sterile barriers used  Catheter size: 8.5 Fr Central line was placed.MAC introducer Procedure performed using ultrasound guided technique. Ultrasound Notes:anatomy identified, needle tip was noted to be adjacent to the nerve/plexus identified, no ultrasound evidence of intravascular and/or intraneural injection and image(s) printed for medical record Attempts: 1 Following insertion, line sutured, dressing applied and Biopatch. Post procedure assessment: blood return through all ports, free fluid flow and no air  Patient tolerated the procedure well with no immediate complications.

## 2021-06-10 NOTE — Brief Op Note (Addendum)
06/06/2021 - 06/10/2021  2:29 PM  PATIENT:  Kenneth Bailey  56 y.o. male  PRE-OPERATIVE DIAGNOSIS:  CORONARY ARTERY DISEASE CONGESTIVE HEART FAILURE  POST-OPERATIVE DIAGNOSIS:  CORONARY ARTERY DISEASE CONGESTIVE HEART FAILURE  PROCEDURE:  Procedure(s):  CORONARY ARTERY BYPASS GRAFTING x 4 -LIMA to LAD -SVG to PL -SVG to OM -SVG to RAMUS INTERMEDIATE  ENDOVEIN HARVEST OF GREATER SAPHENOUS VEIN  -Right Leg (40/10) -Left Thigh (19/5)  TRANSESOPHAGEAL ECHOCARDIOGRAM (TEE) (N/A)  APPLICATION OF CELL SAVER (N/A)  SURGEON:  Surgeon(s) and Role:    Dahlia Byes, MD - Primary  PHYSICIAN ASSISTANT: Ellwood Handler PA-C   ASSISTANTS: Luberta Robertson RNFA,  Despina Arias RNFA   ANESTHESIA:   general  EBL:  487 mL   BLOOD ADMINISTERED:   CC CELLSAVER  DRAINS:  Left Pleural Chest Tube, Mediastinal chest drains    LOCAL MEDICATIONS USED:  NONE  SPECIMEN:  No Specimen  DISPOSITION OF SPECIMEN:  N/A  COUNTS:  YES  TOURNIQUET:  * No tourniquets in log *  DICTATION: .Dragon Dictation  PLAN OF CARE: Admit to inpatient   PATIENT DISPOSITION:  ICU - intubated and hemodynamically stable.   Delay start of Pharmacological VTE agent (>24hrs) due to surgical blood loss or risk of bleeding: no

## 2021-06-10 NOTE — Procedures (Signed)
Extubation Procedure Note  Patient Details:   Name: Kenneth Bailey DOB: 1966/04/26 MRN: [id number]   Airway Documentation:    Vent end date: 06/10/21 Vent end time: 1725   Evaluation  O2 sats: stable throughout Complications: No apparent complications Patient did tolerate procedure well. Bilateral Breath Sounds: (P) Clear, Diminished   Yes extubated to 4 L [place] IS instruction given.  Pt able to state name and where he was ok. Will continur to monitor NIF -30, VC 1.2  Ned Grace 06/10/2021, 5:47 PM

## 2021-06-10 NOTE — Anesthesia Postprocedure Evaluation (Signed)
Anesthesia Post Note  Patient: Dani Gobble  Procedure(s) Performed: CORONARY ARTERY BYPASS GRAFTING (CABG) TIMES FOUR, USING LEFT INTERNAL MAMMARY ARTERY AND RIGHT GREATER SAPHENOUS VEIN HARVESTED ENDOSCOPICALLY (Chest) TRANSESOPHAGEAL ECHOCARDIOGRAM (TEE) APPLICATION OF CELL SAVER ENDOVEIN HARVEST OF GREATER SAPHENOUS VEIN (Right: Leg Lower)     Patient location during evaluation: SICU Anesthesia Type: General Level of consciousness: sedated and patient remains intubated per anesthesia plan Pain management: pain level controlled Vital Signs Assessment: post-procedure vital signs reviewed and stable Respiratory status: patient remains intubated per anesthesia plan and patient on ventilator - see flowsheet for VS Cardiovascular status: stable Anesthetic complications: no   No notable events documented.  Last Vitals:  Vitals:   06/10/21 1545 06/10/21 1615  BP:    Pulse: 79   Resp: 16   Temp: (!) 36.4 C   SpO2: 99% 98%    Last Pain:  Vitals:   06/10/21 0458  TempSrc: Oral  PainSc:                  Nolon Nations

## 2021-06-10 NOTE — Progress Notes (Signed)
°  Echocardiogram Echocardiogram Transesophageal has been performed.  Kenneth Bailey 06/10/2021, 8:25 AM

## 2021-06-10 NOTE — Transfer of Care (Signed)
Immediate Anesthesia Transfer of Care Note  Patient: Kenneth Bailey  Procedure(s) Performed: CORONARY ARTERY BYPASS GRAFTING (CABG) TIMES FOUR, USING LEFT INTERNAL MAMMARY ARTERY AND RIGHT GREATER SAPHENOUS VEIN HARVESTED ENDOSCOPICALLY (Chest) TRANSESOPHAGEAL ECHOCARDIOGRAM (TEE) APPLICATION OF CELL SAVER ENDOVEIN HARVEST OF GREATER SAPHENOUS VEIN (Right: Leg Lower)  Patient Location: ICU  Anesthesia Type:General  Level of Consciousness: sedated, unresponsive and Patient remains intubated per anesthesia plan  Airway & Oxygen Therapy: Patient remains intubated per anesthesia plan and Patient placed on Ventilator (see vital sign flow sheet for setting)  Post-op Assessment: Report given to RN and Post -op Vital signs reviewed and stable  Post vital signs: Reviewed and stable  Last Vitals:  Vitals Value Taken Time  BP 110/70 06/10/21 1500  Temp    Pulse 82 06/10/21 1500  Resp 14 06/10/21 1500  SpO2 96 % 06/10/21 1500  Vitals shown include unvalidated device data.  Last Pain:  Vitals:   06/10/21 0458  TempSrc: Oral  PainSc:       Patients Stated Pain Goal: 1 (56/81/27 5170)  Complications: No notable events documented.

## 2021-06-10 NOTE — Anesthesia Procedure Notes (Signed)
Arterial Line Insertion Start/End1/20/2023 7:05 AM, 06/10/2021 7:15 AM Performed by: Harden Mo, CRNA  Patient location: Pre-op. Preanesthetic checklist: patient identified, IV checked, site marked, risks and benefits discussed, surgical consent, monitors and equipment checked, pre-op evaluation and anesthesia consent Lidocaine 1% used for infiltration Left, radial was placed Catheter size: 20 G Hand hygiene performed  and maximum sterile barriers used   Attempts: 1 Procedure performed without using ultrasound guided technique. Ultrasound Notes:anatomy identified, needle tip was noted to be adjacent to the nerve/plexus identified and no ultrasound evidence of intravascular and/or intraneural injection Following insertion, dressing applied and Biopatch. Post procedure assessment: normal and unchanged  Patient tolerated the procedure well with no immediate complications.

## 2021-06-10 NOTE — Anesthesia Procedure Notes (Signed)
Procedure Name: Intubation Date/Time: 06/10/2021 7:59 AM Performed by: Harden Mo, CRNA Pre-anesthesia Checklist: Patient identified, Emergency Drugs available, Suction available and Patient being monitored Patient Re-evaluated:Patient Re-evaluated prior to induction Oxygen Delivery Method: Circle System Utilized Preoxygenation: Pre-oxygenation with 100% oxygen Induction Type: IV induction Ventilation: Mask ventilation without difficulty and Oral airway inserted - appropriate to patient size Laryngoscope Size: Sabra Heck and 2 Grade View: Grade I Tube type: Oral Tube size: 8.0 mm Number of attempts: 1 Airway Equipment and Method: Stylet and Oral airway Placement Confirmation: ETT inserted through vocal cords under direct vision, positive ETCO2 and breath sounds checked- equal and bilateral Secured at: 23 cm Tube secured with: Tape Dental Injury: Teeth and Oropharynx as per pre-operative assessment

## 2021-06-10 NOTE — Hospital Course (Addendum)
History of Present Illness:  Kenneth Bailey is a 56 yo male with recent history of approximately 2 weeks of progressive exertional dyspnea, orthopnea, PND as well as 15 pound weight gain.  He originally had an event where he developed significant chest pain that was both prolonged and somewhat intermittent.  He kept working through the discomfort and it gradually dissipated.  He presented to [facility] last Saturday and was medically stabilized. He was transferred to [facility] for further evaluation including cardiac cath. .  He was noted to have a significantly elevated BNP of 4100 with negative high-sensitivity troponins on initial presentation to [facility].Marland Kitchen  COVID and flu test were negative.  EKG was unremarkable and showed normal sinus rhythm.  Chest CT was negative for PE but did show coronary artery calcification and slightly enlarged mediastinal lymph nodes.  It also showed small to moderate bilateral pleural effusions and incidental finding of a 2.2 x 0.8 cm h hyper vascular structure in the body of the pancreas.  Echocardiogram was performed and showed LVEF 25 to 30% with global hypokinesis, RV was mildly reduced.  He has multiple known cardiac risk factors including insulin-dependent diabetes, hypertension, hyperlipidemia as well as tobacco abuse/COPD.  Cardiac catheterization was performed on 06/06/2021 and revealed severe three-vessel coronary artery disease.  The left main is also noted to be 50% occluded in the distal portion of the vessel.  It was felt coronary bypass grafting would be indicated and cardiothoracic consultation was requested.  The patient was evaluated by Dr. Prescott Gum who was in agreement the patient would benefit from coronary bypass grafting procedure.  The risks and benefits of the procedure were explained to the patient and he was agreeable to proceed.  Hospital Course:  Mr. Kenneth Bailey remained chest pain free during his hospitalization.  He was aggressively diuresed prior to  proceeding with surgery.  MRI was obtained for the abnormality in his pancreas.  This showed a focal area of relative hypoenhancement measuring 2.2 x 1.0 cm.  They felt that the appearance of the area was the typical appearance of Neoplasm.  However a dedicated pancreas protocol CT is recommended.  The patient was taken to the operating room on 06/10/2021.  He underwent CABG x 4 utilizing LIMA to LAD, SVG to OM, SVG Ramus Intermediate, and SVG to PL.  He also underwent endoscopic harvest of the greater saphenous vein from his right leg and left thigh.  He tolerated the procedure and was taken to the SICU in stable condition.   Postoperative hospital course:  Patient has remained neurologically intact.  He has maintained sinus rhythm.  Cardiac indices were good initially on milrinone, epinephrine, Levophed and Neo-Synephrine.  These medications were slowly titrated off over time.  The advanced heart failure team was consulted to assist with management.  He had an expected postoperative volume overload and was diuresed.  Creatinine has remained normal.  He had an expected acute blood loss anemia which is being monitored clinically.  He did not require transfusion intraoperatively as well as the given FFP due to some coagulation dysfunction.  His coagulopathy was corrected. He was ready for transfer to [place] on the 3rd post-op day. He responded well to diuresis. The milrinone was managed by the advanced heart failure team and was weaned off slowly. Spironolactone was added along with digoxin.

## 2021-06-10 NOTE — Discharge Instructions (Signed)

## 2021-06-11 ENCOUNTER — Inpatient Hospital Stay (HOSPITAL_COMMUNITY): Payer: BC Managed Care – PPO

## 2021-06-11 DIAGNOSIS — I5021 Acute systolic (congestive) heart failure: Secondary | ICD-10-CM | POA: Diagnosis not present

## 2021-06-11 LAB — CBC WITH DIFFERENTIAL/PLATELET
Abs Immature Granulocytes: 0.03 10*3/uL (ref 0.00–0.07)
Basophils Absolute: 0 10*3/uL (ref 0.0–0.1)
Basophils Relative: 0 %
Eosinophils Absolute: 0 10*3/uL (ref 0.0–0.5)
Eosinophils Relative: 0 %
HCT: 32.3 % — ABNORMAL LOW (ref 39.0–52.0)
Hemoglobin: 10.6 g/dL — ABNORMAL LOW (ref 13.0–17.0)
Immature Granulocytes: 0 %
Lymphocytes Relative: 7 %
Lymphs Abs: 0.7 10*3/uL (ref 0.7–4.0)
MCH: 28.4 pg (ref 26.0–34.0)
MCHC: 32.8 g/dL (ref 30.0–36.0)
MCV: 86.6 fL (ref 80.0–100.0)
Monocytes Absolute: 0.8 10*3/uL (ref 0.1–1.0)
Monocytes Relative: 7 %
Neutro Abs: 9.4 10*3/uL — ABNORMAL HIGH (ref 1.7–7.7)
Neutrophils Relative %: 86 %
Platelets: 169 10*3/uL (ref 150–400)
RBC: 3.73 MIL/uL — ABNORMAL LOW (ref 4.22–5.81)
RDW: 13.2 % (ref 11.5–15.5)
WBC: 11.1 10*3/uL — ABNORMAL HIGH (ref 4.0–10.5)
nRBC: 0 % (ref 0.0–0.2)

## 2021-06-11 LAB — GLUCOSE, CAPILLARY
Glucose-Capillary: 103 mg/dL — ABNORMAL HIGH (ref 70–99)
Glucose-Capillary: 126 mg/dL — ABNORMAL HIGH (ref 70–99)
Glucose-Capillary: 127 mg/dL — ABNORMAL HIGH (ref 70–99)
Glucose-Capillary: 131 mg/dL — ABNORMAL HIGH (ref 70–99)
Glucose-Capillary: 149 mg/dL — ABNORMAL HIGH (ref 70–99)
Glucose-Capillary: 152 mg/dL — ABNORMAL HIGH (ref 70–99)
Glucose-Capillary: 155 mg/dL — ABNORMAL HIGH (ref 70–99)
Glucose-Capillary: 163 mg/dL — ABNORMAL HIGH (ref 70–99)
Glucose-Capillary: 167 mg/dL — ABNORMAL HIGH (ref 70–99)
Glucose-Capillary: 168 mg/dL — ABNORMAL HIGH (ref 70–99)
Glucose-Capillary: 213 mg/dL — ABNORMAL HIGH (ref 70–99)

## 2021-06-11 LAB — PREPARE FRESH FROZEN PLASMA
Unit division: 0
Unit division: 0

## 2021-06-11 LAB — BASIC METABOLIC PANEL
Anion gap: 9 (ref 5–15)
BUN: 29 mg/dL — ABNORMAL HIGH (ref 6–20)
CO2: 25 mmol/L (ref 22–32)
Calcium: 7.8 mg/dL — ABNORMAL LOW (ref 8.9–10.3)
Chloride: 100 mmol/L (ref 98–111)
Creatinine, Ser: 1.47 mg/dL — ABNORMAL HIGH (ref 0.61–1.24)
GFR, Estimated: 56 mL/min — ABNORMAL LOW (ref >60)
Glucose, Bld: 189 mg/dL — ABNORMAL HIGH (ref 70–99)
Potassium: 4.9 mmol/L (ref 3.5–5.1)
Sodium: 134 mmol/L — ABNORMAL LOW (ref 135–145)

## 2021-06-11 LAB — BPAM FFP
Blood Product Expiration Date: 202301242359
Blood Product Expiration Date: 202301242359
ISSUE DATE / TIME: 202301201311
ISSUE DATE / TIME: 202301201311
Unit Type and Rh: 600
Unit Type and Rh: 6200

## 2021-06-11 LAB — CBC
HCT: 34.1 % — ABNORMAL LOW (ref 39.0–52.0)
Hemoglobin: 10.9 g/dL — ABNORMAL LOW (ref 13.0–17.0)
MCH: 28.4 pg (ref 26.0–34.0)
MCHC: 32 g/dL (ref 30.0–36.0)
MCV: 88.8 fL (ref 80.0–100.0)
Platelets: 169 K/uL (ref 150–400)
RBC: 3.84 MIL/uL — ABNORMAL LOW (ref 4.22–5.81)
RDW: 13.4 % (ref 11.5–15.5)
WBC: 14 K/uL — ABNORMAL HIGH (ref 4.0–10.5)
nRBC: 0 % (ref 0.0–0.2)

## 2021-06-11 LAB — BASIC METABOLIC PANEL WITH GFR
Anion gap: 5 (ref 5–15)
BUN: 23 mg/dL — ABNORMAL HIGH (ref 6–20)
CO2: 25 mmol/L (ref 22–32)
Calcium: 7.7 mg/dL — ABNORMAL LOW (ref 8.9–10.3)
Chloride: 103 mmol/L (ref 98–111)
Creatinine, Ser: 1.13 mg/dL (ref 0.61–1.24)
GFR, Estimated: >60 mL/min
Glucose, Bld: 143 mg/dL — ABNORMAL HIGH (ref 70–99)
Potassium: 4.7 mmol/L (ref 3.5–5.1)
Sodium: 133 mmol/L — ABNORMAL LOW (ref 135–145)

## 2021-06-11 LAB — COOXEMETRY PANEL
Carboxyhemoglobin: 1.7 % — ABNORMAL HIGH (ref 0.5–1.5)
Methemoglobin: 1 % (ref 0.0–1.5)
O2 Saturation: 64.6 %
Total hemoglobin: 10 g/dL — ABNORMAL LOW (ref 12.0–16.0)

## 2021-06-11 LAB — MAGNESIUM
Magnesium: 2.6 mg/dL — ABNORMAL HIGH (ref 1.7–2.4)
Magnesium: 2.4 mg/dL (ref 1.7–2.4)

## 2021-06-11 MED ORDER — ENOXAPARIN SODIUM 40 MG/0.4ML IJ SOSY
40.0000 mg | PREFILLED_SYRINGE | Freq: Every day | INTRAMUSCULAR | Status: DC
  Administered 2021-06-11 – 2021-06-17 (×7): 40 mg via SUBCUTANEOUS
  Filled 2021-06-11 (×7): qty 0.4

## 2021-06-11 MED ORDER — INSULIN ASPART 100 UNIT/ML IJ SOLN
0.0000 [IU] | INTRAMUSCULAR | Status: DC
  Administered 2021-06-11: 4 [IU] via SUBCUTANEOUS
  Administered 2021-06-11: 8 [IU] via SUBCUTANEOUS
  Administered 2021-06-12: 4 [IU] via SUBCUTANEOUS
  Administered 2021-06-12 (×2): 2 [IU] via SUBCUTANEOUS
  Administered 2021-06-12: 4 [IU] via SUBCUTANEOUS
  Administered 2021-06-12 – 2021-06-13 (×2): 2 [IU] via SUBCUTANEOUS

## 2021-06-11 MED ORDER — ALBUMIN HUMAN 5 % IV SOLN
12.5000 g | Freq: Once | INTRAVENOUS | Status: AC
  Administered 2021-06-11: 12.5 g via INTRAVENOUS
  Filled 2021-06-11: qty 250

## 2021-06-11 MED ORDER — PHENYLEPHRINE HCL-NACL 20-0.9 MG/250ML-% IV SOLN
0.0000 ug/min | INTRAVENOUS | Status: DC
  Administered 2021-06-12: 20 ug/min via INTRAVENOUS
  Filled 2021-06-11: qty 250

## 2021-06-11 MED ORDER — KETOROLAC TROMETHAMINE 15 MG/ML IJ SOLN
15.0000 mg | Freq: Four times a day (QID) | INTRAMUSCULAR | Status: AC
  Administered 2021-06-11 – 2021-06-12 (×4): 15 mg via INTRAVENOUS
  Filled 2021-06-11 (×4): qty 1

## 2021-06-11 MED ORDER — FUROSEMIDE 10 MG/ML IJ SOLN
20.0000 mg | Freq: Two times a day (BID) | INTRAMUSCULAR | Status: DC
  Administered 2021-06-11 (×2): 20 mg via INTRAVENOUS
  Filled 2021-06-11 (×2): qty 2

## 2021-06-11 MED ORDER — INSULIN DETEMIR 100 UNIT/ML ~~LOC~~ SOLN
25.0000 [IU] | Freq: Two times a day (BID) | SUBCUTANEOUS | Status: DC
  Administered 2021-06-11 – 2021-06-18 (×15): 25 [IU] via SUBCUTANEOUS
  Filled 2021-06-11 (×17): qty 0.25

## 2021-06-11 NOTE — Progress Notes (Signed)
° °   °  [address]       [place] [address]             [phone]      POD # 1 CABG x 4  BP 127/81    Pulse 87    Temp 97.7 F (36.5 C)    Resp (!) 23    Ht 5\' 8"  (1.727 m)    Wt 82.8 kg    SpO2 95%    BMI 27.76 kg/m   Intake/Output Summary (Last 24 hours) at 06/11/2021 1739 Last data filed at 06/11/2021 1400 Gross per 24 hour  Intake 1030.88 ml  Output 1905 ml  Net -874.12 ml   Pm labs pending  Remo Lipps C. Roxan Hockey, MD [facility] [phone]

## 2021-06-11 NOTE — Progress Notes (Signed)
1 Day Post-Op Procedure(s) (LRB): CORONARY ARTERY BYPASS GRAFTING (CABG) TIMES FOUR, USING LEFT INTERNAL MAMMARY ARTERY AND RIGHT GREATER SAPHENOUS VEIN HARVESTED ENDOSCOPICALLY (N/A) TRANSESOPHAGEAL ECHOCARDIOGRAM (TEE) (N/A) APPLICATION OF CELL SAVER (N/A) ENDOVEIN HARVEST OF GREATER SAPHENOUS VEIN (Right) Subjective: C/o incisional pain  Objective: Vital signs in last 24 hours: Temp:  [97.2 F (36.2 C)-98.4 F (36.9 C)] 97.9 F (36.6 C) (01/21 0800) Pulse Rate:  [79-92] 87 (01/21 0800) Cardiac Rhythm: Normal sinus rhythm (01/21 0400) Resp:  [12-26] 16 (01/21 0800) BP: (86-117)/(65-78) 117/69 (01/21 0800) SpO2:  [89 %-100 %] 95 % (01/21 0815) Arterial Line BP: (97-165)/(49-84) 134/61 (01/21 0800) FiO2 (%):  [40 %-50 %] 40 % (01/20 1644) Weight:  [82.8 kg] 82.8 kg (01/21 0500)  Hemodynamic parameters for last 24 hours: PAP: (16-49)/(9-26) 39/18 CO:  [4.2 L/min-5.3 L/min] 5.3 L/min CI:  [2.2 L/min/m2-2.8 L/min/m2] 2.8 L/min/m2  Intake/Output from previous day: 01/20 0701 - 01/21 0700 In: 4695.3 [I.V.:2501.6; Blood:1148; IV Piggyback:1045.7] Out: 3299 [Urine:2650; Blood:487; Chest Tube:420] Intake/Output this shift: Total I/O In: 20.7 [I.V.:20.7] Out: 140 [Urine:100; Chest Tube:40]  General appearance: alert, cooperative, and no distress Neurologic: intact Heart: regular rate and rhythm Lungs: diminished breath sounds bibasilar Abdomen: normal findings: soft, non-tender  Lab Results: Recent Labs    06/10/21 1503 06/10/21 1509 06/10/21 1827 06/11/21 0309  WBC 10.9*  --   --  11.1*  HGB 10.1*   < > 10.5* 10.6*  HCT 30.3*   < > 31.0* 32.3*  PLT 154  --   --  169   < > = values in this interval not displayed.   BMET:  Recent Labs    06/10/21 0310 06/10/21 0808 06/10/21 1336 06/10/21 1339 06/10/21 1827 06/11/21 0309  NA 134*   < > 137   < > 138 133*  K 4.7   < > 4.5   < > 4.8 4.7  CL 101   < > 101  --   --  103  CO2 24  --   --   --   --  25  GLUCOSE  221*   < > 155*  --   --  143*  BUN 34*   < > 27*  --   --  23*  CREATININE 1.41*   < > 0.90  --   --  1.13  CALCIUM 9.0  --   --   --   --  7.7*   < > = values in this interval not displayed.    PT/INR:  Recent Labs    06/10/21 1503  LABPROT 16.4*  INR 1.3*   ABG    Component Value Date/Time   PHART 7.384 06/10/2021 1827   HCO3 23.9 06/10/2021 1827   TCO2 25 06/10/2021 1827   ACIDBASEDEF 1.0 06/10/2021 1827   O2SAT 64.6 06/11/2021 0309   CBG (last 3)  Recent Labs    06/11/21 0625 06/11/21 0734 06/11/21 0846  GLUCAP 131* 152* 167*    Assessment/Plan: S/P Procedure(s) (LRB): CORONARY ARTERY BYPASS GRAFTING (CABG) TIMES FOUR, USING LEFT INTERNAL MAMMARY ARTERY AND RIGHT GREATER SAPHENOUS VEIN HARVESTED ENDOSCOPICALLY (N/A) TRANSESOPHAGEAL ECHOCARDIOGRAM (TEE) (N/A) APPLICATION OF CELL SAVER (N/A) ENDOVEIN HARVEST OF GREATER SAPHENOUS VEIN (Right) POD # 1 CABG x 4 NEURO- intact CV- in Sr with good hemodynamics  Ci= 2.6, co-ox 64 on milrinone 0.3- will defer to AHF team on milrinone  ASA, statin, beta blocker RESP_ IS for atelectasis RENAL- creatinine normal, will diurese ENDO- CBG controlled with insulin drip  Will transition to levemir + SSI Gi- advance diet Pain control- will add Toradol x 24 hours Dc chest tubes SCD + enoxaparin for DVT prophylaxis   LOS: 5 days    Melrose Nakayama 06/11/2021

## 2021-06-11 NOTE — Progress Notes (Signed)
Advanced Heart Failure Rounding Note  PCP-Cardiologist: None   Subjective:    CORONARY ARTERY BYPASS GRAFTING x 4 on 1/20 -LIMA to LAD -SVG to PL -SVG to OM -SVG to RAMUS INTERMEDIATE  Sitting in chair. Feels ok. Chest sore. Feels nauseated. Rhythm stable. Luiz Blare out but numbers were stable this am    Objective:   Weight Range: 82.8 kg Body mass index is 27.76 kg/m.   Vital Signs:   Temp:  [97.2 F (36.2 C)-98.4 F (36.9 C)] 97.7 F (36.5 C) (01/21 0900) Pulse Rate:  [79-92] 87 (01/21 1200) Resp:  [12-26] 23 (01/21 1200) BP: (86-127)/(65-81) 127/81 (01/21 1200) SpO2:  [89 %-100 %] 95 % (01/21 1200) Arterial Line BP: (97-165)/(49-84) 139/63 (01/21 0900) FiO2 (%):  [40 %-50 %] 40 % (01/20 1644) Weight:  [82.8 kg] 82.8 kg (01/21 0500) Last BM Date: 06/08/21  Weight change: Filed Weights   06/09/21 0408 06/10/21 0458 06/11/21 0500  Weight: 75.8 kg 75.3 kg 82.8 kg    Intake/Output:   Intake/Output Summary (Last 24 hours) at 06/11/2021 1325 Last data filed at 06/11/2021 1200 Gross per 24 hour  Intake 2798.75 ml  Output 3157 ml  Net -358.25 ml       Physical Exam    General:  Sitting in chair. No resp difficulty HEENT: normal Neck: supple. no JVD. Carotids 2+ bilat; no bruits. No lymphadenopathy or thryomegaly appreciated. Cor: Sternal dressing  Regular rate & rhythm. No rubs, gallops or murmurs. Lungs: clear decreased at bases  Abdomen: soft, nontender, nondistended. No hepatosplenomegaly. No bruits or masses. Good bowel sounds. Extremities: no cyanosis, clubbing, rash, tr edema Neuro: alert & orientedx3, cranial nerves grossly intact. moves all 4 extremities w/o difficulty. Affect pleasant   Telemetry   NSR, 880-90sPersonally reviewed  Labs    CBC Recent Labs    06/10/21 1503 06/10/21 1509 06/10/21 1827 06/11/21 0309  WBC 10.9*  --   --  11.1*  NEUTROABS  --   --   --  9.4*  HGB 10.1*   < > 10.5* 10.6*  HCT 30.3*   < > 31.0* 32.3*  MCV  85.8  --   --  86.6  PLT 154  --   --  169   < > = values in this interval not displayed.    Basic Metabolic Panel Recent Labs    06/10/21 0310 06/10/21 0808 06/10/21 1336 06/10/21 1339 06/10/21 1827 06/11/21 0309  NA 134*   < > 137   < > 138 133*  K 4.7   < > 4.5   < > 4.8 4.7  CL 101   < > 101  --   --  103  CO2 24  --   --   --   --  25  GLUCOSE 221*   < > 155*  --   --  143*  BUN 34*   < > 27*  --   --  23*  CREATININE 1.41*   < > 0.90  --   --  1.13  CALCIUM 9.0  --   --   --   --  7.7*  MG  --   --   --   --   --  2.6*   < > = values in this interval not displayed.    Liver Function Tests No results for input(s): AST, ALT, ALKPHOS, BILITOT, PROT, ALBUMIN in the last 72 hours.  No results for input(s): LIPASE, AMYLASE in the last 72  hours. Cardiac Enzymes No results for input(s): CKTOTAL, CKMB, CKMBINDEX, TROPONINI in the last 72 hours.  BNP: BNP (last 3 results) No results for input(s): BNP in the last 8760 hours.  ProBNP (last 3 results) No results for input(s): PROBNP in the last 8760 hours.   D-Dimer No results for input(s): DDIMER in the last 72 hours. Hemoglobin A1C No results for input(s): HGBA1C in the last 72 hours.  Fasting Lipid Panel No results for input(s): CHOL, HDL, LDLCALC, TRIG, CHOLHDL, LDLDIRECT in the last 72 hours.  Thyroid Function Tests No results for input(s): TSH, T4TOTAL, T3FREE, THYROIDAB in the last 72 hours.  Invalid input(s): FREET3   Other results:   Imaging    DG Chest Port 1 View  Result Date: 06/11/2021 CLINICAL DATA:  Shortness of breath EXAM: PORTABLE CHEST 1 VIEW COMPARISON:  Chest radiograph 06/10/2021 FINDINGS: Pulmonary arterial catheter projects over the central pulmonary artery. Mediastinal drain and bilateral chest tubes redemonstrated. Interval extubation. Stable cardiac and mediastinal contour status post median sternotomy. Low lung volumes. Similar-appearing left greater than right basilar airspace  opacities. Possible small left pleural effusion. No definite pneumothorax. IMPRESSION: Interval extubation. Similar-appearing left greater than right basilar airspace opacities. Electronically Signed   By: Lovey Newcomer M.D.   On: 06/11/2021 10:17   DG CHEST PORT 1 VIEW  Result Date: 06/10/2021 CLINICAL DATA:  Rule out foreign body EXAM: PORTABLE CHEST 1 VIEW COMPARISON:  Chest x-ray dated June 07, 2021 FINDINGS: ETT tip is positioned over the midthoracic trachea. Enteric tube is partially seen coursing below the diaphragm. Right IJ approach PA catheter with tip overlying the expected area of the main pulmonary artery. Bilateral chest tubes in place. Cardiac and mediastinal contours are within normal limits status post median sternotomy and CABG. Probable mild pneumomediastinum. Mild bilateral heterogeneous opacities, likely due to atelectasis. No large pleural effusion. No evidence pneumothorax. No evidence unexpected retained foreign body. IMPRESSION: 1. No evidence of unexpected retained foreign body. 2. No pneumothorax. Critical Value/emergent results were called by telephone at the time of interpretation on 06/10/2021 at 2:49 pm to the OR, provider PETER VANTRIGT . Electronically Signed   By: Yetta Glassman M.D.   On: 06/10/2021 14:52     Medications:     Scheduled Medications:  sodium chloride   Intravenous Once   acetaminophen  1,000 mg Oral Q6H   Or   acetaminophen (TYLENOL) oral liquid 160 mg/5 mL  1,000 mg Per Tube Q6H   aspirin EC  325 mg Oral Daily   Or   aspirin  324 mg Per Tube Daily   atorvastatin  80 mg Oral Q2000   bisacodyl  10 mg Oral Daily   Or   bisacodyl  10 mg Rectal Daily   Chlorhexidine Gluconate Cloth  6 each Topical Daily   docusate sodium  200 mg Oral Daily   enoxaparin (LOVENOX) injection  40 mg Subcutaneous QHS   furosemide  20 mg Intravenous BID   insulin aspart  0-24 Units Subcutaneous Q4H   insulin detemir  25 Units Subcutaneous BID   ketorolac  15 mg  Intravenous Q6H   melatonin  5 mg Oral QHS   mometasone-formoterol  2 puff Inhalation BID   mupirocin cream   Topical BID   nicotine  14 mg Transdermal Daily   [START ON 06/12/2021] pantoprazole  40 mg Oral Daily   sodium chloride flush  3 mL Intravenous Q12H    Infusions:  sodium chloride Stopped (06/11/21 0905)   sodium  chloride     sodium chloride     albumin human      ceFAZolin (ANCEF) IV Stopped (06/11/21 0300)   dexmedetomidine (PRECEDEX) IV infusion Stopped (06/10/21 1623)   epinephrine Stopped (06/10/21 1522)   insulin Stopped (06/11/21 1156)   lactated ringers     lactated ringers     lactated ringers 10 mL/hr at 06/11/21 1200   milrinone 0.2 mcg/kg/min (06/11/21 1200)   nitroGLYCERIN Stopped (06/10/21 1500)   norepinephrine (LEVOPHED) Adult infusion Stopped (06/10/21 2245)    PRN Medications: sodium chloride, albumin human, dextrose, lactated ringers, metoprolol tartrate, midazolam, morphine injection, ondansetron (ZOFRAN) IV, oxyCODONE, sodium chloride flush, traMADol    Patient Profile   56 y/o male w/ multiple cardiac risk factors, HTN, HLD, tobacco abuse and T2DM, admitted w/ new systolic heart failure. Echo at [place], EF 20-25%, RV mildly reduced. Transferred to [facility]. R/LHC showed severe 3v CAD, ischemic CM EF ~25-30%, well compensated filling pressures and preserved CO. CT surgery following for potential CABG.     Assessment/Plan   1. Acute Systolic Heart Failure (New)/ Ischemic CM  - Echo at [facility]: LVEF 25-30%, global HK. RV mildly reduced  - Echo [facility] EF 25-30%, RV normal  - Hs trop negative x 3. COVID and Flu negative - LHC w/ severe 3V CAD, awaiting CABG  - s/p CABG x 4 on 1/20. Hemodynmics look good.  - Wean milrinone to 0.2 add GDMT as tolerated  - will give lasix x 1  2. CAD - LHC w/ severe 3V CAD s/p CABG x 4 1/20 (LIMA to LAD, SVG to PL, SVG to OM, SVG to RI) - ASA 81 mg  - Atorva 80 mg  - Hold b-blocker until off milrinone - CR to  see  3. AKI - SCr 1.4 at [facility], baseline unknown - Scr 1.13   4. Pancreatic Lesion - incidental finding of a 2.2 x 0.8 cm hypervascular structure in the body of the pancreas noted on CT - MRI shows benign thrombosed vessel (had remote h/o severe pancreatitis)  4. Type 2DM - Hgb A1c 10.7 - SSI  - would benefit from SGLT2i post op  - appreciate DM coordinator's recs   5. Tobacco Abuse - smoking cessation advised  - nicotine patch   Length of Stay: [place], MD  06/11/2021, 1:25 PM  Advanced Heart Failure Team Pager [phone] (M-F; 7a - 5p)  Please contact [facility] for night-coverage after hours (5p -7a ) and weekends on [email or web address]

## 2021-06-11 NOTE — Op Note (Signed)
NAME: Kenneth Bailey, HAUSER MEDICAL RECORD NO: [id number] ACCOUNT NO: [id number] DATE OF BIRTH: Dec 29, 1965 FACILITY: [facility] LOCATION: [facility] PHYSICIAN: Len Childs, MD  Operative Report   DATE OF PROCEDURE: 06/10/2021  OPERATION:   1.  Coronary artery bypass grafting x 4 (left internal mammary artery to LAD, saphenous vein graft to posterolateral branch of the RCA, saphenous vein graft to OM1, saphenous vein graft to ramus intermediate). 2.  Endoscopic harvest of bilateral leg saphenous vein.  SURGEON:  Len Childs, MD  ASSISTANT:  Providence Crosby, PA-C.  A surgical first assistant was required for this procedure due to the standard of practice for this level of complexity.  The first assistant was also needed to endoscopically harvest the saphenous vein conduit and  prepare it for the anastomoses as well as closing the leg incisions.  The first assistant was needed to participate in dissection, suctioning, exposure, and organizing the anastomotic sutures for the coronary grafts as well as general assistance  throughout the procedure.  PREOPERATIVE DIAGNOSES:  Ischemic cardiomyopathy, severe 3-vessel coronary artery disease with low systolic function and ejection fraction.  POSTOPERATIVE DIAGNOSES:  Ischemic cardiomyopathy, severe 3-vessel coronary artery disease with low systolic function and ejection fraction.  ANESTHESIA:  General by Dr. Nolon Nations.  CLINICAL NOTE:  The patient is a 56 year old diabetic smoker who presented acutely with shortness of breath and chest discomfort.  Echocardiogram showed severe LV dysfunction and EKG showed nonspecific changes.  He was transferred from an outside  hospital to the cardiology service here with diagnosis of acute systolic heart failure.  Cardiac catheterization was performed because of his significant risk factors for coronary disease and low ejection fraction by echo.  This demonstrated severe  diffuse diabetic pattern of coronary  artery disease with 50% stenosis of the left main and high-grade 90% stenosis of the RCA, circumflex marginal, and mid LAD.  Because of his diabetic 3-vessel disease and poor LV function, he was recommended for  surgical coronary revascularization.  I saw the patient in consultation after reviewing the images of his cardiac catheterization and echocardiogram and agreed with the recommendation for surgical revascularization.  The patient understood that the  procedure would be done at increased risk due to his poor LV function.  While in the hospital being prepared for surgery, his symptoms of failure significantly improved with medical therapy.  His chest x-ray showed resolution of the interstitial edema  and he maintained a sinus rhythm.  I reviewed the details of coronary artery bypass grafting with the patient including the use of general anesthesia and cardiopulmonary bypass, location of the surgical incisions, and the expected postoperative hospital  recovery.  I discussed with him the benefits of the procedure to include increased survival, improved symptoms and preservation of LV function.  I discussed the risks of the procedure to include stroke, bleeding, blood transfusion requirement, MI, organ  failure, infection, arrhythmias, and death.  He demonstrated his understanding and agreed to proceed with the surgery.  We also had a discussion about the potential need for mechanical support with a temporary Impella LVAD if his ventricular function was  not adequate after revascularization to separate from cardiopulmonary bypass.  OPERATIVE FINDINGS:   1.  Good conduit from the leg vein and mammary artery. 2.  Improvement in LV function with EF of 35% after separation from cardiopulmonary bypass - by TEE  DESCRIPTION OF PROCEDURE:  The patient was brought from preoperative holding where informed consent was documented and final issues were  addressed with the patient in a face-to-face encounter.  The  patient was then brought to the operating room, placed  supine on the operating table and general anesthesia was induced under invasive hemodynamic monitoring.  The patient remained stable.  A transesophageal echo probe was placed by the anesthesiologist.  The patient was then prepped and draped as a sterile field.  A proper time-out was performed.  A sternal incision was made as the saphenous vein was harvested endoscopically from the right thigh and the left thigh.  The left internal mammary artery was  harvested as a pedicle graft from its origin at the subclavian vessels.  It was a 1.5 mm vessel with good flow.  The pericardium was opened and suspended.  The heart was enlarged and moderately hypocontractile.  Pursestrings were placed in the ascending aorta and right atrium and after heparin was administered and the ACT was documented as being therapeutic, the  patient was cannulated and placed on bypass.  Coronaries were identified for grafting with difficulty.  The posterolateral branch of the right coronary was the best target for the right coronary.  The OM1 was a small 1 mm vessel, but graftable.  The  ramus intermedius was intramyocardial and was located with some difficulty, but was a 1.5 mm target.  The LAD had diffuse disease, but was graftable with diffuse thickening of the vessel wall at 1.5 mm diameter.  The mammary artery and vein grafts were prepared for the distal anastomoses and the cardioplegia cannulas were replaced for both antegrade and retrograde cold blood cardioplegia.  The patient was cooled to 32 degrees and the aortic crossclamp was  applied.  One liter of cold blood cardioplegia was delivered in split doses between the antegrade aortic and retrograde coronary sinus catheters.  There was good cardioplegic arrest and supple temperature dropped to less than 12 degrees.  Cardioplegia  was delivered every 20 minutes while the crossclamp was placed.  The distal coronary  anastomosis were performed.  The first distal anastomosis was the posterolateral branch of the right coronary.  This was 1.4 mm vessel with proximal 95% stenosis.  A reverse saphenous vein was sewn end-to-side with running 7-0 Prolene  with good flow through the graft.  Cardioplegia was redosed.  The second distal anastomosis was to the OM1 branch of the left coronary.  This had a proximal 95% stenosis.  It was a small 1 mm vessel, but graftable.  A reverse saphenous vein was sewn end-to-side with running 7-0 Prolene with adequate flow through  the graft.  Cardioplegia was redosed.  The third distal anastomosis was the ramus intermedius branch of the left coronary.  It was intramyocardial with a proximal 90% stenosis.  A reverse saphenous vein was sewn end-to-side with running 7-0 Prolene with good flow through the graft.   Cardioplegia was redosed.  The LAD was then grafted with left IMA.  The LAD was 1.5 mm vessel with proximal 95% stenosis.  The left IMA pedicle was brought through an opening in the pericardium, was brought down onto the LAD and sewn end-to-side with a running 8-0 Prolene.  There  was good flow through the anastomosis after briefly releasing the pedicle bulldog on the mammary artery.  The bulldog was reapplied and the pedicle secured to the epicardium.  Cardioplegia was redosed.  While the crossclamp was still in place, 3 proximal vein anastomoses were performed on the ascending aorta using a 4.5 mm punch and running 6-0 Prolene.  Prior to tying down  the final proximal anastomosis,  the air was vented from the coronaries with a  dose of retrograde warm blood cardioplegia and the usual de-airing maneuvers.  The crossclamp was removed.  The heart resumed a spontaneous rhythm.  Hemostasis was documented at the proximal and distal anastomosis.  The patient was rewarmed and reperfused. Temporary pacing wires were applied.  The patient was started on milrinone and the lungs were expanded   and the ventilator resumed.  After the patient had been adequately reperfused, he was actually paced at 80 per minute and weaned off cardiopulmonary bypass without difficulty.  Echo showed improved LV function following the procedure.  Protamine was administered without adverse  reaction.  The cannulas were removed.  Hemostasis was achieved, but required transfusion of FFP to improve coagulation function.  Anterior mediastinal and bilateral pleural tubes were placed and brought out through separate incisions.  The mediastinal  fat was closed over the aorta and vein grafts.  The sternum was closed with interrupted steel wires.  The wires were reapproximated and the patient remained stable.  The pectoralis fascia and subcutaneous layers were closed with running Vicryl and sterile dressings were applied.  Total bypass time was  145 minutes.   VAI D: 06/10/2021 4:13:28 pm T: 06/11/2021 12:54:00 am  JOB: [id number]/ [id number]

## 2021-06-12 ENCOUNTER — Inpatient Hospital Stay (HOSPITAL_COMMUNITY): Payer: BC Managed Care – PPO

## 2021-06-12 DIAGNOSIS — I5021 Acute systolic (congestive) heart failure: Secondary | ICD-10-CM | POA: Diagnosis not present

## 2021-06-12 LAB — BASIC METABOLIC PANEL
Anion gap: 5 (ref 5–15)
Anion gap: 8 (ref 5–15)
BUN: 28 mg/dL — ABNORMAL HIGH (ref 6–20)
BUN: 30 mg/dL — ABNORMAL HIGH (ref 6–20)
CO2: 26 mmol/L (ref 22–32)
CO2: 25 mmol/L (ref 22–32)
Calcium: 7.3 mg/dL — ABNORMAL LOW (ref 8.9–10.3)
Calcium: 7.1 mg/dL — ABNORMAL LOW (ref 8.9–10.3)
Chloride: 99 mmol/L (ref 98–111)
Chloride: 99 mmol/L (ref 98–111)
Creatinine, Ser: 1.32 mg/dL — ABNORMAL HIGH (ref 0.61–1.24)
Creatinine, Ser: 1.34 mg/dL — ABNORMAL HIGH (ref 0.61–1.24)
GFR, Estimated: >60 mL/min (ref >60)
GFR, Estimated: >60 mL/min (ref >60)
Glucose, Bld: 121 mg/dL — ABNORMAL HIGH (ref 70–99)
Glucose, Bld: 145 mg/dL — ABNORMAL HIGH (ref 70–99)
Potassium: 4.3 mmol/L (ref 3.5–5.1)
Potassium: 4.4 mmol/L (ref 3.5–5.1)
Sodium: 130 mmol/L — ABNORMAL LOW (ref 135–145)
Sodium: 132 mmol/L — ABNORMAL LOW (ref 135–145)

## 2021-06-12 LAB — GLUCOSE, CAPILLARY
Glucose-Capillary: 123 mg/dL — ABNORMAL HIGH (ref 70–99)
Glucose-Capillary: 105 mg/dL — ABNORMAL HIGH (ref 70–99)
Glucose-Capillary: 138 mg/dL — ABNORMAL HIGH (ref 70–99)
Glucose-Capillary: 159 mg/dL — ABNORMAL HIGH (ref 70–99)
Glucose-Capillary: 167 mg/dL — ABNORMAL HIGH (ref 70–99)

## 2021-06-12 LAB — CBC
HCT: 27.8 % — ABNORMAL LOW (ref 39.0–52.0)
Hemoglobin: 9.1 g/dL — ABNORMAL LOW (ref 13.0–17.0)
MCH: 28.7 pg (ref 26.0–34.0)
MCHC: 32.7 g/dL (ref 30.0–36.0)
MCV: 87.7 fL (ref 80.0–100.0)
Platelets: 130 10*3/uL — ABNORMAL LOW (ref 150–400)
RBC: 3.17 MIL/uL — ABNORMAL LOW (ref 4.22–5.81)
RDW: 13.3 % (ref 11.5–15.5)
WBC: 9.4 10*3/uL (ref 4.0–10.5)
nRBC: 0 % (ref 0.0–0.2)

## 2021-06-12 LAB — COOXEMETRY PANEL
Carboxyhemoglobin: 1.7 % — ABNORMAL HIGH (ref 0.5–1.5)
Methemoglobin: 1.1 % (ref 0.0–1.5)
O2 Saturation: 64.6 %
Total hemoglobin: 9.1 g/dL — ABNORMAL LOW (ref 12.0–16.0)

## 2021-06-12 MED ORDER — FUROSEMIDE 10 MG/ML IJ SOLN
40.0000 mg | Freq: Two times a day (BID) | INTRAMUSCULAR | Status: DC
  Administered 2021-06-12 (×2): 40 mg via INTRAVENOUS
  Filled 2021-06-12 (×2): qty 4

## 2021-06-12 NOTE — Progress Notes (Signed)
2 Days Post-Op Procedure(s) (LRB): CORONARY ARTERY BYPASS GRAFTING (CABG) TIMES FOUR, USING LEFT INTERNAL MAMMARY ARTERY AND RIGHT GREATER SAPHENOUS VEIN HARVESTED ENDOSCOPICALLY (N/A) TRANSESOPHAGEAL ECHOCARDIOGRAM (TEE) (N/A) APPLICATION OF CELL SAVER (N/A) ENDOVEIN HARVEST OF GREATER SAPHENOUS VEIN (Right) Subjective: Some pain with coughing  Objective: Vital signs in last 24 hours: Temp:  [97.7 F (36.5 C)-98.1 F (36.7 C)] 97.7 F (36.5 C) (01/22 0700) Pulse Rate:  [69-90] 78 (01/22 0845) Cardiac Rhythm: Normal sinus rhythm (01/22 0800) Resp:  [12-29] 19 (01/22 0845) BP: (85-127)/(52-81) 92/57 (01/22 0845) SpO2:  [94 %-99 %] 94 % (01/22 0845) Arterial Line BP: (139)/(63) 139/63 (01/21 0900) Weight:  [82 kg] 82 kg (01/22 0500)  Hemodynamic parameters for last 24 hours: PAP: (37)/(17) 37/17  Intake/Output from previous day: 01/21 0701 - 01/22 0700 In: 795.4 [I.V.:365.3; IV Piggyback:430.1] Out: 1280 [Urine:1240; Chest Tube:40] Intake/Output this shift: No intake/output data recorded.  General appearance: alert, cooperative, and no distress Neurologic: intact Heart: regular rate and rhythm Lungs: diminished breath sounds bibasilar Abdomen: normal findings: soft, non-tender  Lab Results: Recent Labs    06/11/21 1707 06/12/21 0412  WBC 14.0* 9.4  HGB 10.9* 9.1*  HCT 34.1* 27.8*  PLT 169 130*   BMET:  Recent Labs    06/11/21 1707 06/12/21 0412  NA 134* 130*  K 4.9 4.3  CL 100 99  CO2 25 26  GLUCOSE 189* 121*  BUN 29* 28*  CREATININE 1.47* 1.32*  CALCIUM 7.8* 7.3*    PT/INR:  Recent Labs    06/10/21 1503  LABPROT 16.4*  INR 1.3*   ABG    Component Value Date/Time   PHART 7.384 06/10/2021 1827   HCO3 23.9 06/10/2021 1827   TCO2 25 06/10/2021 1827   ACIDBASEDEF 1.0 06/10/2021 1827   O2SAT 64.6 06/12/2021 0412   CBG (last 3)  Recent Labs    06/11/21 2332 06/12/21 0327 06/12/21 0758  GLUCAP 163* 123* 105*    Assessment/Plan: S/P  Procedure(s) (LRB): CORONARY ARTERY BYPASS GRAFTING (CABG) TIMES FOUR, USING LEFT INTERNAL MAMMARY ARTERY AND RIGHT GREATER SAPHENOUS VEIN HARVESTED ENDOSCOPICALLY (N/A) TRANSESOPHAGEAL ECHOCARDIOGRAM (TEE) (N/A) APPLICATION OF CELL SAVER (N/A) ENDOVEIN HARVEST OF GREATER SAPHENOUS VEIN (Right) POD # 2 Overall looks good NEURO- intact CV- in SR  Co-ox 64 on milrinone 0.2  BP relatively low- on neo at 50 RESP - continue IS RENAL- creatinine down from 1.5 to 1.3  Mild diuresis yesterday, on Lasix 20 mg BID ENDO- CBG moderately elevated GI- tolerating PO Ambulate SCD + enoxaparin for DVT prophylaxis  LOS: 6 days    Melrose Nakayama 06/12/2021

## 2021-06-12 NOTE — Progress Notes (Signed)
Advanced Heart Failure Rounding Note  PCP-Cardiologist: None   Subjective:    CORONARY ARTERY BYPASS GRAFTING x 4 on 1/20 -LIMA to LAD -SVG to PL -SVG to OM -SVG to RAMUS INTERMEDIATE  Started on neo overnight for low BPs. Now weaning. Remains on milrinone 0.2. Co-ox 65% Weight up 14 pounds from pre-op.   Chest is sore. Starting to pass gas but no BM. Rhythm stable.   Objective:   Weight Range: 82 kg Body mass index is 27.49 kg/m.   Vital Signs:   Temp:  [97.7 F (36.5 C)-98.1 F (36.7 C)] 98.1 F (36.7 C) (01/22 1100) Pulse Rate:  [69-87] 79 (01/22 1100) Resp:  [12-29] 21 (01/22 1100) BP: (85-127)/(48-81) 104/66 (01/22 1100) SpO2:  [94 %-99 %] 96 % (01/22 1100) Weight:  [82 kg] 82 kg (01/22 0500) Last BM Date: 06/08/21  Weight change: Filed Weights   06/10/21 0458 06/11/21 0500 06/12/21 0500  Weight: 75.3 kg 82.8 kg 82 kg    Intake/Output:   Intake/Output Summary (Last 24 hours) at 06/12/2021 1154 Last data filed at 06/12/2021 1000 Gross per 24 hour  Intake 948.54 ml  Output 1070 ml  Net -121.46 ml       Physical Exam    General:  Sitting up in bed . No resp difficulty HEENT: normal Neck: supple.JVP elevated. Carotids 2+ bilat; no bruits. No lymphadenopathy or thryomegaly appreciated. Cor: Sternal dressing ok Regular rate & rhythm. No rubs, gallops or murmurs. Lungs: clear Abdomen: soft, nontender, nondistended. No hepatosplenomegaly. No bruits or masses. Hypoactive bowel sounds. Extremities: no cyanosis, clubbing, rash, 1+ edema Neuro: alert & orientedx3, cranial nerves grossly intact. moves all 4 extremities w/o difficulty. Affect pleasant   Telemetry   NSR, 870-80s Personally reviewed  Labs    CBC Recent Labs    06/11/21 0309 06/11/21 1707 06/12/21 0412  WBC 11.1* 14.0* 9.4  NEUTROABS 9.4*  --   --   HGB 10.6* 10.9* 9.1*  HCT 32.3* 34.1* 27.8*  MCV 86.6 88.8 87.7  PLT 169 169 130*    Basic Metabolic Panel Recent Labs     06/11/21 0309 06/11/21 1707 06/12/21 0412  NA 133* 134* 130*  K 4.7 4.9 4.3  CL 103 100 99  CO2 25 25 26   GLUCOSE 143* 189* 121*  BUN 23* 29* 28*  CREATININE 1.13 1.47* 1.32*  CALCIUM 7.7* 7.8* 7.3*  MG 2.6* 2.4  --     Liver Function Tests No results for input(s): AST, ALT, ALKPHOS, BILITOT, PROT, ALBUMIN in the last 72 hours.  No results for input(s): LIPASE, AMYLASE in the last 72 hours. Cardiac Enzymes No results for input(s): CKTOTAL, CKMB, CKMBINDEX, TROPONINI in the last 72 hours.  BNP: BNP (last 3 results) No results for input(s): BNP in the last 8760 hours.  ProBNP (last 3 results) No results for input(s): PROBNP in the last 8760 hours.   D-Dimer No results for input(s): DDIMER in the last 72 hours. Hemoglobin A1C No results for input(s): HGBA1C in the last 72 hours.  Fasting Lipid Panel No results for input(s): CHOL, HDL, LDLCALC, TRIG, CHOLHDL, LDLDIRECT in the last 72 hours.  Thyroid Function Tests No results for input(s): TSH, T4TOTAL, T3FREE, THYROIDAB in the last 72 hours.  Invalid input(s): FREET3   Other results:   Imaging    DG Chest Port 1 View  Result Date: 06/12/2021 CLINICAL DATA:  Status post CABG procedure. EXAM: PORTABLE CHEST 1 VIEW COMPARISON:  Chest radiograph 06/11/2021. FINDINGS: Interval retraction  PA catheter. Interval removal mediastinal drain. Patient status post median sternotomy. Interval removal left chest tube. Similar left-greater-than-right small effusions with basilar atelectasis. No definite pneumothorax. IMPRESSION: Similar-appearing small bilateral pleural effusions and left-greater-than-right basilar airspace opacities. Interval removal left chest tube, mediastinal drains and retraction PA catheter. Electronically Signed   By: Lovey Newcomer M.D.   On: 06/12/2021 08:15     Medications:     Scheduled Medications:  sodium chloride   Intravenous Once   acetaminophen  1,000 mg Oral Q6H   Or   acetaminophen (TYLENOL)  oral liquid 160 mg/5 mL  1,000 mg Per Tube Q6H   aspirin EC  325 mg Oral Daily   Or   aspirin  324 mg Per Tube Daily   atorvastatin  80 mg Oral Q2000   bisacodyl  10 mg Oral Daily   Or   bisacodyl  10 mg Rectal Daily   Chlorhexidine Gluconate Cloth  6 each Topical Daily   docusate sodium  200 mg Oral Daily   enoxaparin (LOVENOX) injection  40 mg Subcutaneous QHS   furosemide  40 mg Intravenous BID   insulin aspart  0-24 Units Subcutaneous Q4H   insulin detemir  25 Units Subcutaneous BID   melatonin  5 mg Oral QHS   mometasone-formoterol  2 puff Inhalation BID   mupirocin cream   Topical BID   nicotine  14 mg Transdermal Daily   pantoprazole  40 mg Oral Daily   sodium chloride flush  3 mL Intravenous Q12H    Infusions:  sodium chloride Stopped (06/11/21 0905)   sodium chloride 250 mL (06/11/21 1414)   sodium chloride     dexmedetomidine (PRECEDEX) IV infusion Stopped (06/10/21 1623)   epinephrine Stopped (06/10/21 1522)   insulin Stopped (06/11/21 1156)   lactated ringers     lactated ringers 10 mL/hr at 06/11/21 2200   milrinone 0.1 mcg/kg/min (06/12/21 1120)   nitroGLYCERIN Stopped (06/10/21 1500)   norepinephrine (LEVOPHED) Adult infusion Stopped (06/10/21 2245)   phenylephrine (NEO-SYNEPHRINE) Adult infusion Stopped (06/12/21 0619)    PRN Medications: sodium chloride, dextrose, metoprolol tartrate, midazolam, morphine injection, ondansetron (ZOFRAN) IV, oxyCODONE, sodium chloride flush, traMADol    Patient Profile   56 y/o male w/ multiple cardiac risk factors, HTN, HLD, tobacco abuse and T2DM, admitted w/ new systolic heart failure. Echo at [place], EF 20-25%, RV mildly reduced. Transferred to [facility]. R/LHC showed severe 3v CAD, ischemic CM EF ~25-30%, well compensated filling pressures and preserved CO. CT surgery following for potential CABG.     Assessment/Plan   1. Acute Systolic Heart Failure (New)/ Ischemic CM  - Echo at [facility]: LVEF 25-30%, global HK. RV  mildly reduced  - Echo [facility] EF 25-30%, RV normal  - Hs trop negative x 3. COVID and Flu negative - LHC w/ severe 3V CAD, awaiting CABG  - s/p CABG x 4 on 1/20. - Co-ox 65%. Wean milrinone to 0.1 add GDMT as tolerated when BP stabilized.  - wean neo. Can use midodrine as needed - will give lasix 40 IV bid - ambulate  2. CAD - LHC w/ severe 3V CAD s/p CABG x 4 1/20 (LIMA to LAD, SVG to PL, SVG to OM, SVG to RI) - ASA 81 mg  - Atorva 80 mg  - Hold b-blocker until off milrinone and BP improved - CR to see  3. AKI - SCr 1.4 at [facility], baseline unknown - Scr 1.3 today. Watch with diuresis    4. Pancreatic Lesion -  incidental finding of a 2.2 x 0.8 cm hypervascular structure in the body of the pancreas noted on CT - MRI shows benign thrombosed vessel (had remote h/o severe pancreatitis)  4. Type 2DM, poorly controlled - Hgb A1c 10.7 - SSI  - would benefit from SGLT2i post op  - appreciate DM coordinator's recs   5. Tobacco Abuse - smoking cessation advised  - nicotine patch   Length of Stay: Dale, MD  06/12/2021, 11:54 AM  Advanced Heart Failure Team Pager [phone] (M-F; 7a - 5p)  Please contact [facility] for night-coverage after hours (5p -7a ) and weekends on [email or web address]

## 2021-06-12 NOTE — Progress Notes (Signed)
° °   °  [address]       [place] [address]             [phone]      Stable day Ambulated well BP 114/75    Pulse 84    Temp 98.1 F (36.7 C)    Resp 19    Ht 5\' 8"  (1.727 m)    Wt 82 kg    SpO2 99%    BMI 27.49 kg/m  Milrinone 0.1 mcg/kg/min  Intake/Output Summary (Last 24 hours) at 06/12/2021 1731 Last data filed at 06/12/2021 1700 Gross per 24 hour  Intake 772.22 ml  Output 1185 ml  Net -412.78 ml   Continue current Rx  Kenneth Bailey C. Roxan Hockey, MD [facility] [phone]

## 2021-06-13 ENCOUNTER — Encounter (HOSPITAL_COMMUNITY): Payer: Self-pay | Admitting: Cardiothoracic Surgery

## 2021-06-13 DIAGNOSIS — I5021 Acute systolic (congestive) heart failure: Secondary | ICD-10-CM | POA: Diagnosis not present

## 2021-06-13 LAB — GLUCOSE, CAPILLARY
Glucose-Capillary: 108 mg/dL — ABNORMAL HIGH (ref 70–99)
Glucose-Capillary: 122 mg/dL — ABNORMAL HIGH (ref 70–99)
Glucose-Capillary: 123 mg/dL — ABNORMAL HIGH (ref 70–99)
Glucose-Capillary: 139 mg/dL — ABNORMAL HIGH (ref 70–99)
Glucose-Capillary: 156 mg/dL — ABNORMAL HIGH (ref 70–99)
Glucose-Capillary: 166 mg/dL — ABNORMAL HIGH (ref 70–99)
Glucose-Capillary: 86 mg/dL (ref 70–99)

## 2021-06-13 LAB — TYPE AND SCREEN
ABO/RH(D): O POS
Antibody Screen: NEGATIVE
Unit division: 0
Unit division: 0

## 2021-06-13 LAB — BASIC METABOLIC PANEL
Anion gap: 4 — ABNORMAL LOW (ref 5–15)
BUN: 30 mg/dL — ABNORMAL HIGH (ref 6–20)
CO2: 28 mmol/L (ref 22–32)
Calcium: 7.2 mg/dL — ABNORMAL LOW (ref 8.9–10.3)
Chloride: 100 mmol/L (ref 98–111)
Creatinine, Ser: 1.21 mg/dL (ref 0.61–1.24)
GFR, Estimated: >60 mL/min (ref >60)
Glucose, Bld: 83 mg/dL (ref 70–99)
Potassium: 4.3 mmol/L (ref 3.5–5.1)
Sodium: 132 mmol/L — ABNORMAL LOW (ref 135–145)

## 2021-06-13 LAB — BPAM RBC
Blood Product Expiration Date: 202302022359
Blood Product Expiration Date: 202302032359
ISSUE DATE / TIME: 202301161619
Unit Type and Rh: 5100
Unit Type and Rh: 5100

## 2021-06-13 LAB — COOXEMETRY PANEL
Carboxyhemoglobin: 1.6 % — ABNORMAL HIGH (ref 0.5–1.5)
Methemoglobin: 0.9 % (ref 0.0–1.5)
O2 Saturation: 55.9 %
Total hemoglobin: 11 g/dL — ABNORMAL LOW (ref 12.0–16.0)

## 2021-06-13 LAB — MAGNESIUM: Magnesium: 2.3 mg/dL (ref 1.7–2.4)

## 2021-06-13 MED ORDER — INSULIN ASPART 100 UNIT/ML IJ SOLN: 0.0000 [IU] | Freq: Every day | INTRAMUSCULAR | Status: DC

## 2021-06-13 MED ORDER — METOLAZONE 2.5 MG PO TABS
2.5000 mg | ORAL_TABLET | Freq: Once | ORAL | Status: AC
  Administered 2021-06-13: 2.5 mg via ORAL
  Filled 2021-06-13: qty 1

## 2021-06-13 MED ORDER — SORBITOL 70 % SOLN
45.0000 mL | Freq: Once | Status: AC
  Administered 2021-06-13: 45 mL via ORAL
  Filled 2021-06-13: qty 60

## 2021-06-13 MED ORDER — FUROSEMIDE 10 MG/ML IJ SOLN
40.0000 mg | Freq: Every day | INTRAMUSCULAR | Status: DC
  Administered 2021-06-13 – 2021-06-14 (×2): 40 mg via INTRAVENOUS
  Filled 2021-06-13 (×2): qty 4

## 2021-06-13 MED ORDER — INSULIN ASPART 100 UNIT/ML IJ SOLN
0.0000 [IU] | Freq: Three times a day (TID) | INTRAMUSCULAR | Status: DC
  Administered 2021-06-13: 4 [IU] via SUBCUTANEOUS
  Administered 2021-06-13 – 2021-06-15 (×4): 2 [IU] via SUBCUTANEOUS
  Administered 2021-06-16: 4 [IU] via SUBCUTANEOUS
  Administered 2021-06-16 – 2021-06-17 (×3): 2 [IU] via SUBCUTANEOUS
  Administered 2021-06-17: 3 [IU] via SUBCUTANEOUS

## 2021-06-13 MED ORDER — FUROSEMIDE 10 MG/ML IJ SOLN: 40.0000 mg | Freq: Every day | INTRAMUSCULAR | Status: DC

## 2021-06-13 NOTE — Plan of Care (Signed)
°  Problem: Health Behavior/Discharge Planning: Goal: Ability to manage health-related needs will improve Outcome: Progressing   Problem: Clinical Measurements: Goal: Ability to maintain clinical measurements within normal limits will improve Outcome: Progressing Goal: Will remain free from infection Outcome: Progressing Goal: Diagnostic test results will improve Outcome: Progressing Goal: Respiratory complications will improve Outcome: Progressing Goal: Cardiovascular complication will be avoided Outcome: Progressing   Problem: Education: Goal: Ability to demonstrate management of disease process will improve Outcome: Progressing Goal: Ability to verbalize understanding of medication therapies will improve Outcome: Progressing Goal: Individualized Educational Video(s) Outcome: Progressing   Problem: Activity: Goal: Capacity to carry out activities will improve Outcome: Progressing

## 2021-06-13 NOTE — Progress Notes (Addendum)
Advanced Heart Failure Rounding Note  PCP-Cardiologist: None   Subjective:    CORONARY ARTERY BYPASS GRAFTING x 4 on 1/20 -LIMA to LAD -SVG to PL -SVG to OM -SVG to [facility] INTERMEDIATE  Now off Neo, SBPs soft low 17G systolic, but remains on milrinone 0.1. Co-ox pending.   1.7L in UOP yesterday. Remains 12 lb above pre-op wt. CVP 13-14  SCr 1.34>>1.21  NSR on tele.   Only complaint is chest soreness/ incisional pain. No angina. No dyspnea.    Objective:   Weight Range: 81 kg Body mass index is 27.15 kg/m.   Vital Signs:   Temp:  [97.7 F (36.5 C)-98.1 F (36.7 C)] 97.8 F (36.6 C) (01/23 0700) Pulse Rate:  [71-95] 73 (01/23 0500) Resp:  [12-29] 15 (01/23 0300) BP: (84-124)/(48-82) 85/59 (01/23 0500) SpO2:  [92 %-99 %] 96 % (01/23 0500) Weight:  [81 kg] 81 kg (01/23 0458) Last BM Date: 06/08/21  Weight change: Filed Weights   06/11/21 0500 06/12/21 0500 06/13/21 0458  Weight: 82.8 kg 82 kg 81 kg    Intake/Output:   Intake/Output Summary (Last 24 hours) at 06/13/2021 0829 Last data filed at 06/13/2021 0500 Gross per 24 hour  Intake 759.67 ml  Output 1675 ml  Net -915.33 ml      Physical Exam   CVP 13-14 General:  Well appearing. No respiratory difficulty HEENT: normal + Rt IJ CVC  Neck: supple. JVD 12 cm Carotids 2+ bilat; no bruits. No lymphadenopathy or thyromegaly appreciated. Cor: PMI nondisplaced. Regular rate & rhythm. No rubs, gallops or murmurs. Sternal wound ok  Lungs: clear Abdomen: soft, nontender, nondistended. No hepatosplenomegaly. No bruits or masses. Good bowel sounds. Extremities: no cyanosis, clubbing, rash, trace b/l LE edema Neuro: alert & oriented x 3, cranial nerves grossly intact. moves all 4 extremities w/o difficulty. Affect pleasant.   Telemetry   NSR, 870-80s Personally reviewed  Labs    CBC Recent Labs    06/11/21 0309 06/11/21 1707 06/12/21 0412  WBC 11.1* 14.0* 9.4  NEUTROABS 9.4*  --   --   HGB 10.6*  10.9* 9.1*  HCT 32.3* 34.1* 27.8*  MCV 86.6 88.8 87.7  PLT 169 169 017*   Basic Metabolic Panel Recent Labs    06/11/21 1707 06/12/21 0412 06/12/21 1721 06/13/21 0435  NA 134*   < > 132* 132*  K 4.9   < > 4.4 4.3  CL 100   < > 99 100  CO2 25   < > 25 28  GLUCOSE 189*   < > 145* 83  BUN 29*   < > 30* 30*  CREATININE 1.47*   < > 1.34* 1.21  CALCIUM 7.8*   < > 7.1* 7.2*  MG 2.4  --   --  2.3   < > = values in this interval not displayed.   Liver Function Tests No results for input(s): AST, ALT, ALKPHOS, BILITOT, PROT, ALBUMIN in the last 72 hours.  No results for input(s): LIPASE, AMYLASE in the last 72 hours. Cardiac Enzymes No results for input(s): CKTOTAL, CKMB, CKMBINDEX, TROPONINI in the last 72 hours.  BNP: BNP (last 3 results) No results for input(s): BNP in the last 8760 hours.  ProBNP (last 3 results) No results for input(s): PROBNP in the last 8760 hours.   D-Dimer No results for input(s): DDIMER in the last 72 hours. Hemoglobin A1C No results for input(s): HGBA1C in the last 72 hours.  Fasting Lipid Panel No results for  input(s): CHOL, HDL, LDLCALC, TRIG, CHOLHDL, LDLDIRECT in the last 72 hours.  Thyroid Function Tests No results for input(s): TSH, T4TOTAL, T3FREE, THYROIDAB in the last 72 hours.  Invalid input(s): FREET3   Other results:   Imaging    No results found.   Medications:     Scheduled Medications:  sodium chloride   Intravenous Once   acetaminophen  1,000 mg Oral Q6H   Or   acetaminophen (TYLENOL) oral liquid 160 mg/5 mL  1,000 mg Per Tube Q6H   aspirin EC  325 mg Oral Daily   Or   aspirin  324 mg Per Tube Daily   atorvastatin  80 mg Oral Q2000   bisacodyl  10 mg Oral Daily   Or   bisacodyl  10 mg Rectal Daily   Chlorhexidine Gluconate Cloth  6 each Topical Daily   docusate sodium  200 mg Oral Daily   enoxaparin (LOVENOX) injection  40 mg Subcutaneous QHS   [START ON 06/14/2021] furosemide  40 mg Intravenous Daily    insulin aspart  0-24 Units Subcutaneous TID WC   insulin aspart  0-5 Units Subcutaneous QHS   insulin detemir  25 Units Subcutaneous BID   melatonin  5 mg Oral QHS   mometasone-formoterol  2 puff Inhalation BID   mupirocin cream   Topical BID   nicotine  14 mg Transdermal Daily   pantoprazole  40 mg Oral Daily   sodium chloride flush  3 mL Intravenous Q12H    Infusions:  sodium chloride Stopped (06/11/21 0905)   sodium chloride 250 mL (06/11/21 1414)   sodium chloride     milrinone 0.1 mcg/kg/min (06/13/21 0500)    PRN Medications: sodium chloride, dextrose, metoprolol tartrate, ondansetron (ZOFRAN) IV, oxyCODONE, sodium chloride flush, traMADol    Patient Profile   56 y/o male w/ multiple cardiac risk factors, HTN, HLD, tobacco abuse and T2DM, admitted w/ new systolic heart failure. Echo at [place], EF 20-25%, RV mildly reduced. Transferred to [facility]. R/LHC showed severe 3v CAD, ischemic CM EF ~25-30%, well compensated filling pressures and preserved CO. CT surgery following for potential CABG.     Assessment/Plan   1. Acute Systolic Heart Failure (New)/ Ischemic CM  - Echo at [facility]: LVEF 25-30%, global HK. RV mildly reduced  - Echo [facility] EF 25-30%, RV normal  - Hs trop negative x 3. COVID and Flu negative - LHC w/ severe 3V CAD, awaiting CABG  - s/p CABG x 4 on 1/20. - On milrinone 0.1. Check Co-ox. If stable, will d/c milrinone  - add GDMT as tolerated when BP stabilized.  - Can use midodrine as needed - Up 12 lb above pre-op. CVP 13. Will give lasix 40 IV bid - ambulate  2. CAD - LHC w/ severe 3V CAD s/p CABG x 4 1/20 (LIMA to LAD, SVG to PL, SVG to OM, SVG to RI) - ASA 81 mg  - Atorva 80 mg  - Hold b-blocker until off milrinone and BP improved - CR to see  3. AKI - SCr 1.4 at [facility], baseline unknown - Scr 1.21 today. Watch with diuresis    4. Pancreatic Lesion - incidental finding of a 2.2 x 0.8 cm hypervascular structure in the body of the pancreas noted  on CT - MRI shows benign thrombosed vessel (had remote h/o severe pancreatitis)  4. Type 2DM, poorly controlled - Hgb A1c 10.7 - SSI  - would benefit from SGLT2i post op  - appreciate DM coordinator's recs  5. Tobacco Abuse - smoking cessation advised  - nicotine patch   Length of Stay: [address] PA-C  06/13/2021, 8:29 AM  Advanced Heart Failure Team Pager [phone] (M-F; 7a - 5p)  Please contact [facility] for night-coverage after hours (5p -7a ) and weekends on [email or web address]  Patient seen and examined with the above-signed Advanced Practice Provider and/or Housestaff. I personally reviewed laboratory data, imaging studies and relevant notes. I independently examined the patient and formulated the important aspects of the plan. I have edited the note to reflect any of my changes or salient points. I have personally discussed the plan with the patient and/or family.  Still with some chest soreness. Passing gas but no BM. Modest diuresis on IV lasix.   Remains on milrinone 0.1 Co-ox pending. CVP 13  General:  Sitting up in bed No resp difficulty HEENT: normal  Neck: supple. no JVD. RIJ cath Carotids 2+ bilat; no bruits. No lymphadenopathy or thryomegaly appreciated. Cor: Sternal wound ok Regular rate & rhythm. No rubs, gallops or murmurs. Lungs: clear Abdomen: soft, nontender, + distended. No hepatosplenomegaly. No bruits or masses. Good bowel sounds. Extremities: no cyanosis, clubbing, rash, 2+ edema Neuro: alert & orientedx3, cranial nerves grossly intact. moves all 4 extremities w/o difficulty. Affect pleasant  Await co-ox. If > 60% stop milrinone. Continue lasix. Give metolazone 2.5. Add TED hose.   Sorbitol for constipation. Ambulate.   Glori Bickers, MD  9:26 AM

## 2021-06-13 NOTE — Progress Notes (Signed)
Patient ID: Kenneth Bailey, male   DOB: 27-Jun-1965, 56 y.o.   MRN: [id number]  TCTS Evening Rounds:  Hemodynamically stable in sinus rhythm. Milrinone 0.1.  Good diuresis today.  Waiting on stepdown bed

## 2021-06-13 NOTE — Progress Notes (Signed)
3 Days Post-Op Procedure(s) (LRB): CORONARY ARTERY BYPASS GRAFTING (CABG) TIMES FOUR, USING LEFT INTERNAL MAMMARY ARTERY AND RIGHT GREATER SAPHENOUS VEIN HARVESTED ENDOSCOPICALLY (N/A) TRANSESOPHAGEAL ECHOCARDIOGRAM (TEE) (N/A) APPLICATION OF CELL SAVER (N/A) ENDOVEIN HARVEST OF GREATER SAPHENOUS VEIN (Right) Subjective: Up in chair w/o complaint  Objective: Vital signs in last 24 hours: Temp:  [97.7 F (36.5 C)-98.1 F (36.7 C)] 97.7 F (36.5 C) (01/23 0400) Pulse Rate:  [71-95] 73 (01/23 0500) Cardiac Rhythm: Normal sinus rhythm (01/23 0500) Resp:  [12-29] 15 (01/23 0300) BP: (84-124)/(48-82) 85/59 (01/23 0500) SpO2:  [92 %-99 %] 96 % (01/23 0500) Weight:  [81 kg] 81 kg (01/23 0458)  Hemodynamic parameters for last 24 hours: CVP:  [0 mmHg-16 mmHg] 16 mmHg  Intake/Output from previous day: 01/22 0701 - 01/23 0700 In: 759.7 [P.O.:720; I.V.:39.7] Out: 1675 [Urine:1675] Intake/Output this shift: No intake/output data recorded.       Exam    General- alert and comfortable    Neck- no JVD, no cervical adenopathy palpable, no carotid bruit   Lungs- clear without rales, wheezes. Chest incision dry   Cor- regular rate and rhythm, no murmur , gallop   Abdomen- soft, non-tender   Extremities - warm, non-tender, minimal edema   Neuro- oriented, appropriate, no focal weakness   Lab Results: Recent Labs    06/11/21 1707 06/12/21 0412  WBC 14.0* 9.4  HGB 10.9* 9.1*  HCT 34.1* 27.8*  PLT 169 130*   BMET:  Recent Labs    06/12/21 1721 06/13/21 0435  NA 132* 132*  K 4.4 4.3  CL 99 100  CO2 25 28  GLUCOSE 145* 83  BUN 30* 30*  CREATININE 1.34* 1.21  CALCIUM 7.1* 7.2*    PT/INR:  Recent Labs    06/10/21 1503  LABPROT 16.4*  INR 1.3*   ABG    Component Value Date/Time   PHART 7.384 06/10/2021 1827   HCO3 23.9 06/10/2021 1827   TCO2 25 06/10/2021 1827   ACIDBASEDEF 1.0 06/10/2021 1827   O2SAT 64.6 06/12/2021 0412   CBG (last 3)  Recent Labs     06/13/21 0004 06/13/21 0351 06/13/21 0734  GLUCAP 123* 86 108*    Assessment/Plan: S/P Procedure(s) (LRB): CORONARY ARTERY BYPASS GRAFTING (CABG) TIMES FOUR, USING LEFT INTERNAL MAMMARY ARTERY AND RIGHT GREATER SAPHENOUS VEIN HARVESTED ENDOSCOPICALLY (N/A) TRANSESOPHAGEAL ECHOCARDIOGRAM (TEE) (N/A) APPLICATION OF CELL SAVER (N/A) ENDOVEIN HARVEST OF GREATER SAPHENOUS VEIN (Right) Mobilize Diuresis Diabetes control Plan for transfer to step-down: see transfer orders   LOS: 7 days    Dahlia Byes 06/13/2021

## 2021-06-14 ENCOUNTER — Inpatient Hospital Stay (HOSPITAL_COMMUNITY): Payer: BC Managed Care – PPO

## 2021-06-14 ENCOUNTER — Inpatient Hospital Stay: Payer: Self-pay

## 2021-06-14 DIAGNOSIS — I5021 Acute systolic (congestive) heart failure: Secondary | ICD-10-CM | POA: Diagnosis not present

## 2021-06-14 LAB — BASIC METABOLIC PANEL
Anion gap: 6 (ref 5–15)
BUN: 24 mg/dL — ABNORMAL HIGH (ref 6–20)
CO2: 29 mmol/L (ref 22–32)
Calcium: 7.3 mg/dL — ABNORMAL LOW (ref 8.9–10.3)
Chloride: 98 mmol/L (ref 98–111)
Creatinine, Ser: 1.04 mg/dL (ref 0.61–1.24)
GFR, Estimated: >60 mL/min (ref >60)
Glucose, Bld: 110 mg/dL — ABNORMAL HIGH (ref 70–99)
Potassium: 4.1 mmol/L (ref 3.5–5.1)
Sodium: 133 mmol/L — ABNORMAL LOW (ref 135–145)

## 2021-06-14 LAB — CBC
HCT: 26.7 % — ABNORMAL LOW (ref 39.0–52.0)
Hemoglobin: 8.5 g/dL — ABNORMAL LOW (ref 13.0–17.0)
MCH: 28.3 pg (ref 26.0–34.0)
MCHC: 31.8 g/dL (ref 30.0–36.0)
MCV: 89 fL (ref 80.0–100.0)
Platelets: 141 10*3/uL — ABNORMAL LOW (ref 150–400)
RBC: 3 MIL/uL — ABNORMAL LOW (ref 4.22–5.81)
RDW: 13.3 % (ref 11.5–15.5)
WBC: 4.3 10*3/uL (ref 4.0–10.5)
nRBC: 0 % (ref 0.0–0.2)

## 2021-06-14 LAB — GLUCOSE, CAPILLARY
Glucose-Capillary: 72 mg/dL (ref 70–99)
Glucose-Capillary: 126 mg/dL — ABNORMAL HIGH (ref 70–99)
Glucose-Capillary: 107 mg/dL — ABNORMAL HIGH (ref 70–99)
Glucose-Capillary: 130 mg/dL — ABNORMAL HIGH (ref 70–99)
Glucose-Capillary: 128 mg/dL — ABNORMAL HIGH (ref 70–99)

## 2021-06-14 LAB — COOXEMETRY PANEL
Carboxyhemoglobin: 1.4 % (ref 0.5–1.5)
Methemoglobin: 0.8 % (ref 0.0–1.5)
O2 Saturation: 51.5 %
Total hemoglobin: 12.8 g/dL (ref 12.0–16.0)

## 2021-06-14 LAB — MAGNESIUM: Magnesium: 2.2 mg/dL (ref 1.7–2.4)

## 2021-06-14 MED ORDER — SODIUM CHLORIDE 0.9% FLUSH
10.0000 mL | Freq: Two times a day (BID) | INTRAVENOUS | Status: DC
  Administered 2021-06-14 – 2021-06-17 (×7): 10 mL

## 2021-06-14 MED ORDER — SODIUM CHLORIDE 0.9% FLUSH: 10.0000 mL | INTRAVENOUS | Status: DC | PRN

## 2021-06-14 MED ORDER — PROCHLORPERAZINE EDISYLATE 10 MG/2ML IJ SOLN
10.0000 mg | Freq: Four times a day (QID) | INTRAMUSCULAR | Status: DC | PRN
  Filled 2021-06-14 (×2): qty 2

## 2021-06-14 MED ORDER — DIGOXIN 125 MCG PO TABS
0.1250 mg | ORAL_TABLET | Freq: Every day | ORAL | Status: DC
  Administered 2021-06-14 – 2021-06-18 (×5): 0.125 mg via ORAL
  Filled 2021-06-14 (×5): qty 1

## 2021-06-14 MED ORDER — FUROSEMIDE 10 MG/ML IJ SOLN
80.0000 mg | Freq: Two times a day (BID) | INTRAMUSCULAR | Status: DC
  Administered 2021-06-14 – 2021-06-15 (×2): 80 mg via INTRAVENOUS
  Filled 2021-06-14 (×2): qty 8

## 2021-06-14 MED ORDER — METOLAZONE 2.5 MG PO TABS: 2.5000 mg | ORAL_TABLET | Freq: Once | ORAL | Status: DC

## 2021-06-14 MED ORDER — POTASSIUM CHLORIDE CRYS ER 20 MEQ PO TBCR
40.0000 meq | EXTENDED_RELEASE_TABLET | Freq: Once | ORAL | Status: AC
  Administered 2021-06-14: 11:00:00 40 meq via ORAL
  Filled 2021-06-14: qty 2

## 2021-06-14 MED ORDER — METOLAZONE 2.5 MG PO TABS
2.5000 mg | ORAL_TABLET | Freq: Once | ORAL | Status: AC
  Administered 2021-06-14: 11:00:00 2.5 mg via ORAL
  Filled 2021-06-14: qty 1

## 2021-06-14 NOTE — Progress Notes (Addendum)
Advanced Heart Failure Rounding Note  PCP-Cardiologist: None   Subjective:    CORONARY ARTERY BYPASS GRAFTING x 4 on 1/20 -LIMA to LAD -SVG to PL -SVG to OM -SVG to [facility] INTERMEDIATE  Remains on milrinone 0.1. Co-ox 52%. SCr ok 1.04   1.5 L in measured UOP yesterday + 2 unmeasured urinary occurences.  Still up ~14 lb from pre-op. CVP 11  BM x 2 yesterday.   Hgb 8.5   Pt concern b/c he has not had any urinary voids since last PM. Feels like he has to go (have asked RN to bladder scan). Denies pelvic discomfort.   No other complaints. Denies dyspnea. Appetite ok. Has been ambulating.    Objective:   Weight Range: 82.4 kg Body mass index is 27.62 kg/m.   Vital Signs:   Temp:  [97.6 F (36.4 C)-98.8 F (37.1 C)] 97.9 F (36.6 C) (01/24 0700) Pulse Rate:  [71-117] 76 (01/24 0800) Resp:  [12-22] 22 (01/24 0800) BP: (95-155)/(61-90) 116/71 (01/24 0800) SpO2:  [92 %-99 %] 93 % (01/24 0800) Weight:  [82.4 kg] 82.4 kg (01/24 0500) Last BM Date: 06/13/21  Weight change: Filed Weights   06/12/21 0500 06/13/21 0458 06/14/21 0500  Weight: 82 kg 81 kg 82.4 kg    Intake/Output:   Intake/Output Summary (Last 24 hours) at 06/14/2021 0905 Last data filed at 06/14/2021 0800 Gross per 24 hour  Intake 531.74 ml  Output 1300 ml  Net -768.26 ml      Physical Exam   CVP 11  General:  Well appearing, sitting up in chair. No respiratory difficulty HEENT: normal Neck: supple.+ Rt IJ CVC, CVP 10 cm Carotids 2+ bilat; no bruits. No lymphadenopathy or thyromegaly appreciated. Cor: PMI nondisplaced. Regular rate & rhythm. No rubs, gallops or murmurs. Sternal site stable  Lungs: clear Abdomen: distended. Good bowl sounds, nontender No hepatosplenomegaly. No bruits or masses.  Extremities: no cyanosis, clubbing, rash, trace b/l LE edema Neuro: alert & oriented x 3, cranial nerves grossly intact. moves all 4 extremities w/o difficulty. Affect pleasant.    Telemetry    NSR 80s Personally reviewed  Labs    CBC Recent Labs    06/12/21 0412 06/14/21 0353  WBC 9.4 4.3  HGB 9.1* 8.5*  HCT 27.8* 26.7*  MCV 87.7 89.0  PLT 130* 947*   Basic Metabolic Panel Recent Labs    06/13/21 0435 06/14/21 0353  NA 132* 133*  K 4.3 4.1  CL 100 98  CO2 28 29  GLUCOSE 83 110*  BUN 30* 24*  CREATININE 1.21 1.04  CALCIUM 7.2* 7.3*  MG 2.3 2.2   Liver Function Tests No results for input(s): AST, ALT, ALKPHOS, BILITOT, PROT, ALBUMIN in the last 72 hours.  No results for input(s): LIPASE, AMYLASE in the last 72 hours. Cardiac Enzymes No results for input(s): CKTOTAL, CKMB, CKMBINDEX, TROPONINI in the last 72 hours.  BNP: BNP (last 3 results) No results for input(s): BNP in the last 8760 hours.  ProBNP (last 3 results) No results for input(s): PROBNP in the last 8760 hours.   D-Dimer No results for input(s): DDIMER in the last 72 hours. Hemoglobin A1C No results for input(s): HGBA1C in the last 72 hours.  Fasting Lipid Panel No results for input(s): CHOL, HDL, LDLCALC, TRIG, CHOLHDL, LDLDIRECT in the last 72 hours.  Thyroid Function Tests No results for input(s): TSH, T4TOTAL, T3FREE, THYROIDAB in the last 72 hours.  Invalid input(s): FREET3   Other results:   Imaging  DG Chest Port 1 View  Result Date: 06/14/2021 CLINICAL DATA:  History of CABG.  Shortness of breath. EXAM: PORTABLE CHEST 1 VIEW COMPARISON:  Multiple chest XRs, most recently 06/12/2021. CT chest, 06/04/2021. FINDINGS: Support lines: RIGHT IJ sheath.  Overlying pacer leads. Cardiomediastinal silhouette is enlarged, unchanged. Coronary bypass. Hypoinflation with persistent LEFT basilar consolidation and patchy opacities. The RIGHT lung remains relatively clear. Small volume LEFT pleural effusion. No pneumothorax. Chronic RIGHT rib deformities. No interval osseous abnormality. IMPRESSION: 1. Lines and tubes as above. 2. Unchanged pulmonary findings. Electronically Signed    By: Michaelle Birks M.D.   On: 06/14/2021 08:05   [place] EKG SITE RITE  Result Date: 06/14/2021 If Site Rite image not attached, placement could not be confirmed due to current cardiac rhythm.    Medications:     Scheduled Medications:  sodium chloride   Intravenous Once   acetaminophen  1,000 mg Oral Q6H   Or   acetaminophen (TYLENOL) oral liquid 160 mg/5 mL  1,000 mg Per Tube Q6H   aspirin EC  325 mg Oral Daily   Or   aspirin  324 mg Per Tube Daily   atorvastatin  80 mg Oral Q2000   bisacodyl  10 mg Oral Daily   Or   bisacodyl  10 mg Rectal Daily   Chlorhexidine Gluconate Cloth  6 each Topical Daily   docusate sodium  200 mg Oral Daily   enoxaparin (LOVENOX) injection  40 mg Subcutaneous QHS   furosemide  40 mg Intravenous Daily   insulin aspart  0-24 Units Subcutaneous TID WC   insulin aspart  0-5 Units Subcutaneous QHS   insulin detemir  25 Units Subcutaneous BID   melatonin  5 mg Oral QHS   mometasone-formoterol  2 puff Inhalation BID   mupirocin cream   Topical BID   nicotine  14 mg Transdermal Daily   pantoprazole  40 mg Oral Daily   sodium chloride flush  3 mL Intravenous Q12H    Infusions:  sodium chloride Stopped (06/11/21 0905)   sodium chloride 250 mL (06/11/21 1414)   sodium chloride     milrinone 0.1 mcg/kg/min (06/14/21 0800)    PRN Medications: sodium chloride, dextrose, metoprolol tartrate, ondansetron (ZOFRAN) IV, oxyCODONE, sodium chloride flush, traMADol    Patient Profile   56 y/o male w/ multiple cardiac risk factors, HTN, HLD, tobacco abuse and T2DM, admitted w/ new systolic heart failure. Echo at [place], EF 20-25%, RV mildly reduced. Transferred to [facility]. R/LHC showed severe 3v CAD, ischemic CM EF ~25-30%, well compensated filling pressures and preserved CO. CT surgery following for potential CABG.     Assessment/Plan   1. Acute Systolic Heart Failure (New)/ Ischemic CM  - Echo at [facility]: LVEF 25-30%, global HK. RV mildly reduced  - Echo [facility]  EF 25-30%, RV normal  - Hs trop negative x 3. COVID and Flu negative - LHC w/ severe 3V CAD, awaiting CABG  - s/p CABG x 4 on 1/20. - On milrinone 0.1. Co-ox marginal, 52%. Renal fx ok. Continue milrinone today - Add digoxin 0.125 - Up 14 lb above pre-op. CVP 11. Will give lasix 40 IV bid + metolazone 2.5 today and supp K  - add GDMT as tolerated when BP stabilized.  - Can use midodrine as needed - continue to ambulate - remove IJ CVC and replace w/ PICC   2. CAD - LHC w/ severe 3V CAD s/p CABG x 4 1/20 (LIMA to LAD, SVG to PL, SVG  to OM, SVG to RI) - ASA 81 mg  - Atorva 80 mg  - Hold b-blocker until off milrinone and BP improved - CR to see  3. AKI - SCr 1.4 at [facility], baseline unknown - Scr 1.21>>1.04 today. Watch with diuresis    4. Pancreatic Lesion - incidental finding of a 2.2 x 0.8 cm hypervascular structure in the body of the pancreas noted on CT - MRI shows benign thrombosed vessel (had remote h/o severe pancreatitis)  4. Type 2DM, poorly controlled - Hgb A1c 10.7 - SSI  - would benefit from SGLT2i post op  - appreciate DM coordinator's recs   5. Tobacco Abuse - smoking cessation advised  - nicotine patch   6. Constipation  - resolved w/ sorbitol    Length of Stay: [address], PA-C  06/14/2021, 9:05 AM  Advanced Heart Failure Team Pager [phone] (M-F; 7a - 5p)  Please contact [facility] for night-coverage after hours (5p -7a ) and weekends on [email or web address]  Patient seen and examined with the above-signed Advanced Practice Provider and/or Housestaff. I personally reviewed laboratory data, imaging studies and relevant notes. I independently examined the patient and formulated the important aspects of the plan. I have edited the note to reflect any of my changes or salient points. I have personally discussed the plan with the patient and/or family.  Urine output has slowed. Co-ox is low on milrinone 0.1 Scr stable. Feels bloated. Weight up 14  pounds  General:  Sitting in chair  No resp difficulty HEENT: normal Neck: supple. JVP up Carotids 2+ bilat; no bruits. No lymphadenopathy or thryomegaly appreciated. Cor: Sternal wound ok  Regular rate & rhythm. No rubs, gallops or murmurs. Lungs: clear Abdomen: soft, nontender, nondistended. No hepatosplenomegaly. No bruits or masses. Good bowel sounds. Extremities: no cyanosis, clubbing, rash, 1+  edema Neuro: alert & orientedx3, cranial nerves grossly intact. moves all 4 extremities w/o difficulty. Affect pleasant  Remains volume overloaded. Co-ox low. Increase milrinone to 0.2. Increase lasix to 80 IV bid with metolazone 2.5. If output doesn;t pick up will switch to lasix gtt. Ambulate.   Glori Bickers, MD  12:02 PM

## 2021-06-14 NOTE — Progress Notes (Signed)
Peripherally Inserted Central Catheter Placement  The IV Nurse has discussed with the patient and/or persons authorized to consent for the patient, the purpose of this procedure and the potential benefits and risks involved with this procedure.  The benefits include less needle sticks, lab draws from the catheter, and the patient may be discharged home with the catheter. Risks include, but not limited to, infection, bleeding, blood clot (thrombus formation), and puncture of an artery; nerve damage and irregular heartbeat and possibility to perform a PICC exchange if needed/ordered by physician.  Alternatives to this procedure were also discussed.  Bard Power PICC patient education guide, fact sheet on infection prevention and patient information card has been provided to patient /or left at bedside.    PICC Placement Documentation  PICC Double Lumen 06/14/21 PICC Right Brachial 38 cm 0 cm (Active)  Indication for Insertion or Continuance of Line Prolonged intravenous therapies 06/14/21 1040  Exposed Catheter (cm) 0 cm 06/14/21 1040  Site Assessment Clean;Dry;Intact 06/14/21 1040  Lumen #1 Status Flushed;Saline locked;Blood return noted 06/14/21 1040  Lumen #2 Status Blood return noted;Saline locked;Flushed 06/14/21 1040  Dressing Type Transparent;Securing device 06/14/21 1040  Dressing Status Clean;Dry;Intact 06/14/21 1040  Antimicrobial disc in place? Yes 06/14/21 1040  Safety Lock Not Applicable 94/85/46 2703  Dressing Change Due 06/21/21 06/14/21 1040       Frances Maywood 06/14/2021, 10:44 AM

## 2021-06-14 NOTE — Progress Notes (Signed)
CARDIAC REHAB PHASE I   PRE:  Rate/Rhythm: 83 SR    BP: sitting 124/78    SaO2: 91 RA  MODE:  Ambulation: 340 ft   POST:  Rate/Rhythm: 98 SR    BP: sitting 149/77     SaO2: 95 RA  Pt asleep on arrival. Moved out of bed independently and walked with RW. Slow and steady, c/o nausea and 5/10 sternal pain. Return to bed for sleep, asking for different nausea med and pain med, pt trembling due to pain. RN aware. [place], ACSM 06/14/2021 3:24 PM

## 2021-06-14 NOTE — Progress Notes (Signed)
4 Days Post-Op Procedure(s) (LRB): CORONARY ARTERY BYPASS GRAFTING (CABG) TIMES FOUR, USING LEFT INTERNAL MAMMARY ARTERY AND RIGHT GREATER SAPHENOUS VEIN HARVESTED ENDOSCOPICALLY (N/A) TRANSESOPHAGEAL ECHOCARDIOGRAM (TEE) (N/A) APPLICATION OF CELL SAVER (N/A) ENDOVEIN HARVEST OF GREATER SAPHENOUS VEIN (Right) Subjective: Still fluid overloaded on lasix iv dosing Milrinone increased to maintain coox > 55 CXR with mild atelectesis Objective: Vital signs in last 24 hours: Temp:  [97.9 F (36.6 C)-98.8 F (37.1 C)] 98.1 F (36.7 C) (01/24 1100) Pulse Rate:  [71-92] 92 (01/24 0900) Cardiac Rhythm: Normal sinus rhythm (01/24 0858) Resp:  [12-23] 23 (01/24 0900) BP: (95-155)/(61-99) 144/99 (01/24 0900) SpO2:  [92 %-99 %] 95 % (01/24 0900) Weight:  [82.4 kg] 82.4 kg (01/24 0500)  Hemodynamic parameters for last 24 hours:  nsr  Intake/Output from previous day: 01/23 0701 - 01/24 0700 In: 538.5 [P.O.:480; I.V.:58.5] Out: 1600 [Urine:1500; Emesis/NG output:100] Intake/Output this shift: Total I/O In: 129 [P.O.:120; I.V.:9] Out: 1575 [Urine:1475; Emesis/NG output:100]  Incision clean Mild edema  Lab Results: Recent Labs    06/12/21 0412 06/14/21 0353  WBC 9.4 4.3  HGB 9.1* 8.5*  HCT 27.8* 26.7*  PLT 130* 141*   BMET:  Recent Labs    06/13/21 0435 06/14/21 0353  NA 132* 133*  K 4.3 4.1  CL 100 98  CO2 28 29  GLUCOSE 83 110*  BUN 30* 24*  CREATININE 1.21 1.04  CALCIUM 7.2* 7.3*    PT/INR: No results for input(s): LABPROT, INR in the last 72 hours. ABG    Component Value Date/Time   PHART 7.384 06/10/2021 1827   HCO3 23.9 06/10/2021 1827   TCO2 25 06/10/2021 1827   ACIDBASEDEF 1.0 06/10/2021 1827   O2SAT 51.5 06/14/2021 0558   CBG (last 3)  Recent Labs    06/14/21 0558 06/14/21 0922 06/14/21 1128  GLUCAP 72 126* 107*    Assessment/Plan: S/P Procedure(s) (LRB): CORONARY ARTERY BYPASS GRAFTING (CABG) TIMES FOUR, USING LEFT INTERNAL MAMMARY ARTERY AND  RIGHT GREATER SAPHENOUS VEIN HARVESTED ENDOSCOPICALLY (N/A) TRANSESOPHAGEAL ECHOCARDIOGRAM (TEE) (N/A) APPLICATION OF CELL SAVER (N/A) ENDOVEIN HARVEST OF GREATER SAPHENOUS VEIN (Right) Mobilize Diuresis Diabetes control Cont milrinone   LOS: 8 days    Kenneth Bailey 06/14/2021

## 2021-06-14 NOTE — TOC Initial Note (Signed)
Transition of Care [facility]) - Initial/Assessment Note    Patient Details  Name: Kenneth Bailey MRN: [id number] Date of Birth: [birth year]  Transition of Care [facility]) CM/SW Contact:    Kenneth Rasher, RN Phone Number: [phone] 06/14/2021, 5:10 PM  Clinical Narrative:                 HF TOC CM spoke to pt and agreeable to [facility] RN. Will need HH RN orders with F2F. [name]. Pt states he did not need any DME. Will contact [place] with new referral. Will continue to follow for dc needs.   Expected Discharge Plan: [place] Barriers to Discharge: Continued Medical Work up   Patient Goals and CMS Choice Patient states their goals for this hospitalization and ongoing recovery are:: wants to go home CMS Medicare.gov Compare Post Acute Care list provided to:: Patient Choice offered to / list presented to : Patient  Expected Discharge Plan and Services Expected Discharge Plan: [place]   Discharge Planning Services: CM Consult Post Acute Care Choice: [place] arrangements for the past 2 months: Single Family Home   Prior Living Arrangements/Services Living arrangements for the past 2 months: Single Family Home Lives with:: Self   Do you feel safe going back to the place where you live?: Yes      Need for Family Participation in Patient Care: Yes (Comment) Care giver support system in place?: Yes (comment)   Criminal Activity/Legal Involvement Pertinent to Current Situation/Hospitalization: No - Comment as needed  Activities of Daily Living Home Assistive Devices/Equipment: None ADL Screening (condition at time of admission) Patient's cognitive ability adequate to safely complete daily activities?: Yes Is the patient deaf or have difficulty hearing?: No Does the patient have difficulty seeing, even when wearing glasses/contacts?: Yes (wears eyeglasses) Does  the patient have difficulty concentrating, remembering, or making decisions?: No Patient able to express need for assistance with ADLs?: No Does the patient have difficulty dressing or bathing?: No Independently performs ADLs?: Yes (appropriate for developmental age) Does the patient have difficulty walking or climbing stairs?: No Weakness of Legs: None Weakness of Arms/Hands: None  Permission Sought/Granted Permission sought to share information with : Case Manager, Family Supports, PCP    Share Information with NAME: Kenneth Bailey  Permission granted to share info w AGENCY: [place] granted to share info w Relationship: girlfriend  Permission granted to share info w Contact Information: [phone]  Emotional Assessment Appearance:: Appears stated age Attitude/Demeanor/Rapport: Engaged Affect (typically observed): Accepting Orientation: : Oriented to Self, Oriented to Place, Oriented to Situation, Oriented to  Time   Psych Involvement: No (comment)  Admission diagnosis:  Acute systolic heart failure ([place]) [I50.21] Patient Active Problem List   Diagnosis Date Noted   Acute systolic heart failure ([place]) 06/06/2021   PCP:  Kenneth Merles, MD Pharmacy:   [facility], [place] [address] Phone: [phone] Fax: [phone]     Social Determinants of Health (SDOH) Interventions    Readmission Risk Interventions No flowsheet data found.

## 2021-06-15 ENCOUNTER — Inpatient Hospital Stay (HOSPITAL_COMMUNITY): Payer: BC Managed Care – PPO

## 2021-06-15 DIAGNOSIS — I5021 Acute systolic (congestive) heart failure: Secondary | ICD-10-CM | POA: Diagnosis not present

## 2021-06-15 LAB — CBC
HCT: 31.1 % — ABNORMAL LOW (ref 39.0–52.0)
Hemoglobin: 10.3 g/dL — ABNORMAL LOW (ref 13.0–17.0)
MCH: 28.8 pg (ref 26.0–34.0)
MCHC: 33.1 g/dL (ref 30.0–36.0)
MCV: 86.9 fL (ref 80.0–100.0)
Platelets: 219 10*3/uL (ref 150–400)
RBC: 3.58 MIL/uL — ABNORMAL LOW (ref 4.22–5.81)
RDW: 13.2 % (ref 11.5–15.5)
WBC: 4.8 10*3/uL (ref 4.0–10.5)
nRBC: 0 % (ref 0.0–0.2)

## 2021-06-15 LAB — COOXEMETRY PANEL
Carboxyhemoglobin: 2.2 % — ABNORMAL HIGH (ref 0.5–1.5)
Methemoglobin: 0.9 % (ref 0.0–1.5)
O2 Saturation: 54.9 %
Total hemoglobin: 9.8 g/dL — ABNORMAL LOW (ref 12.0–16.0)

## 2021-06-15 LAB — BASIC METABOLIC PANEL
Anion gap: 6 (ref 5–15)
BUN: 25 mg/dL — ABNORMAL HIGH (ref 6–20)
CO2: 32 mmol/L (ref 22–32)
Calcium: 8.5 mg/dL — ABNORMAL LOW (ref 8.9–10.3)
Chloride: 91 mmol/L — ABNORMAL LOW (ref 98–111)
Creatinine, Ser: 1.25 mg/dL — ABNORMAL HIGH (ref 0.61–1.24)
GFR, Estimated: >60 mL/min (ref >60)
Glucose, Bld: 99 mg/dL (ref 70–99)
Potassium: 4.2 mmol/L (ref 3.5–5.1)
Sodium: 129 mmol/L — ABNORMAL LOW (ref 135–145)

## 2021-06-15 LAB — GLUCOSE, CAPILLARY
Glucose-Capillary: 72 mg/dL (ref 70–99)
Glucose-Capillary: 81 mg/dL (ref 70–99)
Glucose-Capillary: 134 mg/dL — ABNORMAL HIGH (ref 70–99)
Glucose-Capillary: 136 mg/dL — ABNORMAL HIGH (ref 70–99)
Glucose-Capillary: 142 mg/dL — ABNORMAL HIGH (ref 70–99)

## 2021-06-15 MED ORDER — FUROSEMIDE 40 MG PO TABS
40.0000 mg | ORAL_TABLET | Freq: Every day | ORAL | Status: DC
  Administered 2021-06-16: 40 mg via ORAL
  Filled 2021-06-15: qty 1

## 2021-06-15 MED ORDER — SPIRONOLACTONE 12.5 MG HALF TABLET
12.5000 mg | ORAL_TABLET | Freq: Every day | ORAL | Status: DC
  Administered 2021-06-15 – 2021-06-18 (×4): 12.5 mg via ORAL
  Filled 2021-06-15 (×4): qty 1

## 2021-06-15 MED FILL — Calcium Chloride Inj 10%: INTRAVENOUS | Qty: 10 | Status: AC

## 2021-06-15 MED FILL — Heparin Sodium (Porcine) Inj 1000 Unit/ML: INTRAMUSCULAR | Qty: 10 | Status: AC

## 2021-06-15 MED FILL — Potassium Chloride Inj 2 mEq/ML: INTRAVENOUS | Qty: 40 | Status: AC

## 2021-06-15 MED FILL — Sodium Chloride IV Soln 0.9%: INTRAVENOUS | Qty: 2000 | Status: AC

## 2021-06-15 MED FILL — Acetazolamide Cap ER 12HR 500 MG: ORAL | Qty: 100 | Status: AC

## 2021-06-15 MED FILL — Mannitol IV Soln 20%: INTRAVENOUS | Qty: 500 | Status: AC

## 2021-06-15 MED FILL — Electrolyte-R (PH 7.4) Solution: INTRAVENOUS | Qty: 6000 | Status: AC

## 2021-06-15 MED FILL — Magnesium Sulfate Inj 50%: INTRAMUSCULAR | Qty: 10 | Status: AC

## 2021-06-15 MED FILL — Heparin Sodium (Porcine) Inj 1000 Unit/ML: Qty: 1000 | Status: AC

## 2021-06-15 MED FILL — Lidocaine HCl (Cardiac) IV PF Soln 100 MG/5ML (2%): INTRAVENOUS | Qty: 5 | Status: AC

## 2021-06-15 NOTE — Progress Notes (Addendum)
[address]       [place] [address]             [phone]      5 Days Post-Op Procedure(s) (LRB): CORONARY ARTERY BYPASS GRAFTING (CABG) TIMES FOUR, USING LEFT INTERNAL MAMMARY ARTERY AND RIGHT GREATER SAPHENOUS VEIN HARVESTED ENDOSCOPICALLY (N/A) TRANSESOPHAGEAL ECHOCARDIOGRAM (TEE) (N/A) APPLICATION OF CELL SAVER (N/A) ENDOVEIN HARVEST OF GREATER SAPHENOUS VEIN (Right) Subjective: Sitting up trying to eat breakfast but says he doesn't like what he is served. Having some chest soreness.  BM yesterday.   Milrinone at 0.24mcg/kg/min CoOx 55  Objective: Vital signs in last 24 hours: Temp:  [97.3 F (36.3 C)-98.3 F (36.8 C)] 97.6 F (36.4 C) (01/25 0332) Pulse Rate:  [79-92] 79 (01/25 0332) Cardiac Rhythm: Normal sinus rhythm (01/25 0704) Resp:  [10-23] 19 (01/25 0547) BP: (119-150)/(71-99) 129/76 (01/25 0332) SpO2:  [91 %-95 %] 93 % (01/25 0332) Weight:  [77 kg] 77 kg (01/25 0626)  Hemodynamic parameters for last 24 hours:    Intake/Output from previous day: 01/24 0701 - 01/25 0700 In: 533.8 [P.O.:480; I.V.:53.8] Out: 5875 [Urine:5775; Emesis/NG output:100] Intake/Output this shift: No intake/output data recorded.  General appearance: alert, cooperative, and no distress Neurologic: intact Heart: RRR, few PVC's Lungs: breath sounds clear to auscultation.  Abdomen: soft, NT.  Extremities: no peripheral edema. Bilateral LE EVH incisions are intact and dry. Wound: The sternotomy incision is dry and intact.   Lab Results: Recent Labs    06/14/21 0353 06/15/21 0352  WBC 4.3 4.8  HGB 8.5* 10.3*  HCT 26.7* 31.1*  PLT 141* 219   BMET:  Recent Labs    06/14/21 0353 06/15/21 0352  NA 133* 129*  K 4.1 4.2  CL 98 91*  CO2 29 32  GLUCOSE 110* 99  BUN 24* 25*  CREATININE 1.04 1.25*  CALCIUM 7.3* 8.5*    PT/INR: No results for input(s): LABPROT, INR in the last 72 hours. ABG    Component Value Date/Time   PHART 7.384 06/10/2021  1827   HCO3 23.9 06/10/2021 1827   TCO2 25 06/10/2021 1827   ACIDBASEDEF 1.0 06/10/2021 1827   O2SAT 54.9 06/15/2021 0352   CBG (last 3)  Recent Labs    06/14/21 2102 06/15/21 0622 06/15/21 0701  GLUCAP 128* 72 81    CLINICAL DATA:  Sore chest, recent CABG   EXAM: PORTABLE CHEST 1 VIEW   COMPARISON:  Chest radiograph 06/14/2021   FINDINGS: The right IJ vascular sheath has been removed. There is a new right upper extremity PICC with the tip terminating in the lower SVC.   The cardiomediastinal silhouette is stable, with unchanged cardiomegaly. Median sternotomy wires are again noted.   Lung volumes are low. There is a small left pleural effusion with adjacent opacity likely reflecting atelectasis, not significantly changed. Otherwise, there is no new or worsening focal airspace disease. There is no significant right effusion. There is no appreciable pneumothorax.   There is no acute osseous abnormality.   IMPRESSION: Small left pleural effusion with adjacent opacity likely reflecting atelectasis, overall not significantly changed in the interim. No new or worsening focal airspace disease. No appreciable pneumothorax.  Assessment/Plan: S/P Procedure(s) (LRB): CORONARY ARTERY BYPASS GRAFTING (CABG) TIMES FOUR, USING LEFT INTERNAL MAMMARY ARTERY AND RIGHT GREATER SAPHENOUS VEIN HARVESTED ENDOSCOPICALLY (N/A) TRANSESOPHAGEAL ECHOCARDIOGRAM (TEE) (N/A) APPLICATION OF CELL SAVER (N/A) ENDOVEIN HARVEST OF GREATER SAPHENOUS VEIN (Right)  -Postop day 5 CABG x4 after presenting with acute systolic  heart failure and low EF.  Overall continuing to progress.  On milrinone at 0.2 mcg/kg/min. CoOx this AM was morning was 55.  Milrinone, long-term cardiac medications, and diuresis is being managed by Dr. Haroldine Laws.  -Volume excess-brisk diuresis yesterday with concurrent 5 kg weight loss.  He has been converted to oral Lasix today.  -Renal-Baseline renal function was normal.   Creatinine bumped up to 1.25 today likely in response to diuresis.  Monitor  -Expected acute blood loss anemia and postoperative thrombocytopenia-hematocrit is trending up,  platelet count has recovered.  -Pulm-maintaining adequate oxygen saturation on room air.  No unexpected changes on chest x-ray.  Continue ambulation and lung work.  -Endo-type 2 diabetes mellitus, admission hemoglobin A1c was 10.7.  Glucose control is adequate on current Levemir 25 units subcu twice daily and insulin sliding scale.     LOS: 9 days    Antony Odea, PA-C [phone] 06/15/2021  Slowly progressing and approaching dry wt  Mil wean per Cardiology  Chest incision clean,  dry  patient examined and medical record reviewed,agree with above note. Dahlia Byes 06/15/2021

## 2021-06-15 NOTE — Progress Notes (Signed)
CARDIAC REHAB PHASE I   PRE:  Rate/Rhythm: 94 SR    BP: sitting 114/77    SaO2: 97 RA  MODE:  Ambulation: 464 ft   POST:  Rate/Rhythm: 108 ST    BP: sitting 146/82     SaO2: 97 RA  Pt asleep on arrival. Moved out of bed independently and ambulated with RW. Steady, no c/o. To recliner, lunch arrived. Pt doing well but needs encouragement. Encouraged recliner, x2 more walks, IS. [phone]  Darrick Meigs CES, ACSM 06/15/2021 11:44 AM

## 2021-06-15 NOTE — Progress Notes (Addendum)
Advanced Heart Failure Rounding Note  PCP-Cardiologist: None   Subjective:    CORONARY ARTERY BYPASS GRAFTING x 4 on 1/20 -LIMA to LAD -SVG to PL -SVG to OM -SVG to RAMUS INTERMEDIATE  Milrinone increased back to 0.2 yesterday for low co-ox. Lasix increased to 80 bid + metolazone.   Co-ox slightly improved, 52>>55% today.   Brisk diuresis yesterday w/ 5.7L in UOP. Wt down 12 lb. Nearing pre-op wt. No CVP set up.  Scr 1.04>>1.25  K 4.2  Na 129   Objective:   Weight Range: 77 kg Body mass index is 25.81 kg/m.   Vital Signs:   Temp:  [97.3 F (36.3 C)-98.6 F (37 C)] 98.6 F (37 C) (01/25 0803) Pulse Rate:  [79-92] 85 (01/25 0803) Resp:  [10-23] 11 (01/25 0803) BP: (119-150)/(71-99) 124/80 (01/25 0803) SpO2:  [91 %-95 %] 93 % (01/25 0803) Weight:  [77 kg] 77 kg (01/25 0626) Last BM Date: 06/14/21  Weight change: Filed Weights   06/13/21 0458 06/14/21 0500 06/15/21 0626  Weight: 81 kg 82.4 kg 77 kg    Intake/Output:   Intake/Output Summary (Last 24 hours) at 06/15/2021 0850 Last data filed at 06/15/2021 0803 Gross per 24 hour  Intake 1153.45 ml  Output 5225 ml  Net -4071.55 ml      Physical Exam   General:  Well appearing. No respiratory difficulty HEENT: normal Neck: supple. JVD ~6 cm. Carotids 2+ bilat; no bruits. No lymphadenopathy or thyromegaly appreciated. Cor: PMI nondisplaced. Regular rate & rhythm. No rubs, gallops or murmurs. Sternal site ok  Lungs: clear Abdomen: soft, nontender, nondistended. No hepatosplenomegaly. No bruits or masses. Good bowel sounds. Extremities: no cyanosis, clubbing, rash, edema +RUE PICC  Neuro: alert & oriented x 3, cranial nerves grossly intact. moves all 4 extremities w/o difficulty. Affect pleasant.   Telemetry   NSR 80s Personally reviewed  Labs    CBC Recent Labs    06/14/21 0353 06/15/21 0352  WBC 4.3 4.8  HGB 8.5* 10.3*  HCT 26.7* 31.1*  MCV 89.0 86.9  PLT 141* 101   Basic Metabolic  Panel Recent Labs    06/13/21 0435 06/14/21 0353 06/15/21 0352  NA 132* 133* 129*  K 4.3 4.1 4.2  CL 100 98 91*  CO2 28 29 32  GLUCOSE 83 110* 99  BUN 30* 24* 25*  CREATININE 1.21 1.04 1.25*  CALCIUM 7.2* 7.3* 8.5*  MG 2.3 2.2  --    Liver Function Tests No results for input(s): AST, ALT, ALKPHOS, BILITOT, PROT, ALBUMIN in the last 72 hours.  No results for input(s): LIPASE, AMYLASE in the last 72 hours. Cardiac Enzymes No results for input(s): CKTOTAL, CKMB, CKMBINDEX, TROPONINI in the last 72 hours.  BNP: BNP (last 3 results) No results for input(s): BNP in the last 8760 hours.  ProBNP (last 3 results) No results for input(s): PROBNP in the last 8760 hours.   D-Dimer No results for input(s): DDIMER in the last 72 hours. Hemoglobin A1C No results for input(s): HGBA1C in the last 72 hours.  Fasting Lipid Panel No results for input(s): CHOL, HDL, LDLCALC, TRIG, CHOLHDL, LDLDIRECT in the last 72 hours.  Thyroid Function Tests No results for input(s): TSH, T4TOTAL, T3FREE, THYROIDAB in the last 72 hours.  Invalid input(s): FREET3   Other results:   Imaging    DG Chest Port 1 View  Result Date: 06/15/2021 CLINICAL DATA:  Sore chest, recent CABG EXAM: PORTABLE CHEST 1 VIEW COMPARISON:  Chest radiograph 06/14/2021  FINDINGS: The right IJ vascular sheath has been removed. There is a new right upper extremity PICC with the tip terminating in the lower SVC. The cardiomediastinal silhouette is stable, with unchanged cardiomegaly. Median sternotomy wires are again noted. Lung volumes are low. There is a small left pleural effusion with adjacent opacity likely reflecting atelectasis, not significantly changed. Otherwise, there is no new or worsening focal airspace disease. There is no significant right effusion. There is no appreciable pneumothorax. There is no acute osseous abnormality. IMPRESSION: Small left pleural effusion with adjacent opacity likely reflecting  atelectasis, overall not significantly changed in the interim. No new or worsening focal airspace disease. No appreciable pneumothorax. Electronically Signed   By: Valetta Mole M.D.   On: 06/15/2021 08:41     Medications:     Scheduled Medications:  sodium chloride   Intravenous Once   acetaminophen  1,000 mg Oral Q6H   Or   acetaminophen (TYLENOL) oral liquid 160 mg/5 mL  1,000 mg Per Tube Q6H   aspirin EC  325 mg Oral Daily   Or   aspirin  324 mg Per Tube Daily   atorvastatin  80 mg Oral Q2000   bisacodyl  10 mg Oral Daily   Or   bisacodyl  10 mg Rectal Daily   Chlorhexidine Gluconate Cloth  6 each Topical Daily   digoxin  0.125 mg Oral Daily   docusate sodium  200 mg Oral Daily   enoxaparin (LOVENOX) injection  40 mg Subcutaneous QHS   furosemide  80 mg Intravenous BID   insulin aspart  0-24 Units Subcutaneous TID WC   insulin aspart  0-5 Units Subcutaneous QHS   insulin detemir  25 Units Subcutaneous BID   melatonin  5 mg Oral QHS   mometasone-formoterol  2 puff Inhalation BID   mupirocin cream   Topical BID   nicotine  14 mg Transdermal Daily   pantoprazole  40 mg Oral Daily   sodium chloride flush  10-40 mL Intracatheter Q12H   sodium chloride flush  3 mL Intravenous Q12H    Infusions:  sodium chloride Stopped (06/11/21 0905)   sodium chloride 250 mL (06/11/21 1414)   sodium chloride     milrinone 0.2 mcg/kg/min (06/14/21 2305)    PRN Medications: sodium chloride, dextrose, metoprolol tartrate, ondansetron (ZOFRAN) IV, oxyCODONE, prochlorperazine, sodium chloride flush, sodium chloride flush, traMADol    Patient Profile   56 y/o male w/ multiple cardiac risk factors, HTN, HLD, tobacco abuse and T2DM, admitted w/ new systolic heart failure. Echo at [place], EF 20-25%, RV mildly reduced. Transferred to [facility]. R/LHC showed severe 3v CAD, ischemic CM EF ~25-30%, well compensated filling pressures and preserved CO. CT surgery following for potential CABG.      Assessment/Plan   1. Acute Systolic Heart Failure ([place])/ Ischemic CM  - Echo at [facility]: LVEF 25-30%, global HK. RV mildly reduced  - Echo [facility] EF 25-30%, RV normal  - Hs trop negative x 3. COVID and Flu negative - LHC w/ severe 3V CAD, awaiting CABG  - s/p CABG x 4 on 1/20. - On milrinone 0.2. Co-ox 55%  - Volume improving, nearing pre-op wt. Stop IV Lasix and transition to PO Lasix 40 daily  - Continue digoxin 0.125 - Add Spiro 12.5 mg daily  - continue to ambulate   2. CAD - LHC w/ severe 3V CAD s/p CABG x 4 1/20 (LIMA to LAD, SVG to PL, SVG to OM, SVG to RI) - ASA 81 mg  -  Atorva 80 mg  - Hold b-blocker until off milrinone and BP improved - ambulate w/ CR   3. AKI - SCr 1.4 at [facility], baseline unknown - Scr 1.21>>1.04>1.24 today. Watch with diuresis    4. Pancreatic Lesion - incidental finding of a 2.2 x 0.8 cm hypervascular structure in the body of the pancreas noted on CT - MRI shows benign thrombosed vessel (had remote h/o severe pancreatitis)  4. Type 2DM, poorly controlled - Hgb A1c 10.7 - SSI  - would benefit from SGLT2i  - appreciate DM coordinator's recs   5. Tobacco Abuse - smoking cessation advised  - nicotine patch   6. Constipation  - resolved w/ sorbitol   7. Hyponatremia - Na 129  - monitor   Length of Stay: [address], PA-C  06/15/2021, 8:50 AM  Advanced Heart Failure Team Pager [phone] (M-F; 7a - 5p)  Please contact [facility] for night-coverage after hours (5p -7a ) and weekends on [email or web address]  Patient seen and examined with the above-signed Advanced Practice Provider and/or Housestaff. I personally reviewed laboratory data, imaging studies and relevant notes. I independently examined the patient and formulated the important aspects of the plan. I have edited the note to reflect any of my changes or salient points. I have personally discussed the plan with the patient and/or family.  Good diuresis overnight on higher  dose of milrinone and IV lasix. Breathing better. Scr 1.04 -> 1.25.  Rhythm stable   General:  Sitting in chair.. No resp difficulty HEENT: normal Neck: supple. no JVD. Carotids 2+ bilat; no bruits. No lymphadenopathy or thryomegaly appreciated. Cor: Sternal wound stable Regular rate & rhythm. No rubs, gallops or murmurs. Lungs: clear Abdomen: soft, nontender, nondistended. No hepatosplenomegaly. No bruits or masses. Good bowel sounds. Extremities: no cyanosis, clubbing, rash, edema Neuro: alert & orientedx3, cranial nerves grossly intact. moves all 4 extremities w/o difficulty. Affect pleasant  Volume status much improved. Can stop IV lasix. Add spiro. Continue milrinone 0.2. Begin wean tomorrow as tolerated. Ambulate.   Glori Bickers, MD  11:34 AM

## 2021-06-15 NOTE — Progress Notes (Signed)
Mobility Specialist Progress Note    06/15/21 1600  Mobility  Activity Refused mobility   Pt stated he did not want to get up. Will f/u tomorrow.   [facility] Mobility Specialist  M.S. 2C and 6E: [phone] M.S. 4E: [phone]

## 2021-06-15 NOTE — Plan of Care (Signed)
°  Problem: Health Behavior/Discharge Planning: Goal: Ability to manage health-related needs will improve Outcome: Progressing   Problem: Clinical Measurements: Goal: Ability to maintain clinical measurements within normal limits will improve Outcome: Progressing   Problem: Education: Goal: Ability to verbalize understanding of medication therapies will improve Outcome: Progressing   Problem: Activity: Goal: Capacity to carry out activities will improve Outcome: Progressing   Problem: Clinical Measurements: Goal: Respiratory complications will improve Outcome: Not Applicable

## 2021-06-16 ENCOUNTER — Other Ambulatory Visit (HOSPITAL_COMMUNITY): Payer: Self-pay

## 2021-06-16 DIAGNOSIS — I5021 Acute systolic (congestive) heart failure: Secondary | ICD-10-CM | POA: Diagnosis not present

## 2021-06-16 LAB — BASIC METABOLIC PANEL
Anion gap: 13 (ref 5–15)
BUN: 30 mg/dL — ABNORMAL HIGH (ref 6–20)
CO2: 34 mmol/L — ABNORMAL HIGH (ref 22–32)
Calcium: 9.3 mg/dL (ref 8.9–10.3)
Chloride: 89 mmol/L — ABNORMAL LOW (ref 98–111)
Creatinine, Ser: 1.12 mg/dL (ref 0.61–1.24)
GFR, Estimated: >60 mL/min (ref >60)
Glucose, Bld: 108 mg/dL — ABNORMAL HIGH (ref 70–99)
Potassium: 3.8 mmol/L (ref 3.5–5.1)
Sodium: 136 mmol/L (ref 135–145)

## 2021-06-16 LAB — CBC
HCT: 32.5 % — ABNORMAL LOW (ref 39.0–52.0)
Hemoglobin: 11 g/dL — ABNORMAL LOW (ref 13.0–17.0)
MCH: 28.4 pg (ref 26.0–34.0)
MCHC: 33.8 g/dL (ref 30.0–36.0)
MCV: 84 fL (ref 80.0–100.0)
Platelets: 244 10*3/uL (ref 150–400)
RBC: 3.87 MIL/uL — ABNORMAL LOW (ref 4.22–5.81)
RDW: 13.1 % (ref 11.5–15.5)
WBC: 5.4 10*3/uL (ref 4.0–10.5)
nRBC: 0 % (ref 0.0–0.2)

## 2021-06-16 LAB — GLUCOSE, CAPILLARY
Glucose-Capillary: 151 mg/dL — ABNORMAL HIGH (ref 70–99)
Glucose-Capillary: 165 mg/dL — ABNORMAL HIGH (ref 70–99)
Glucose-Capillary: 151 mg/dL — ABNORMAL HIGH (ref 70–99)
Glucose-Capillary: 136 mg/dL — ABNORMAL HIGH (ref 70–99)

## 2021-06-16 LAB — COOXEMETRY PANEL
Carboxyhemoglobin: 1.8 % — ABNORMAL HIGH (ref 0.5–1.5)
Methemoglobin: 0.8 % (ref 0.0–1.5)
O2 Saturation: 57 %
Total hemoglobin: 11.1 g/dL — ABNORMAL LOW (ref 12.0–16.0)

## 2021-06-16 MED ORDER — SACUBITRIL-VALSARTAN 24-26 MG PO TABS
1.0000 | ORAL_TABLET | Freq: Two times a day (BID) | ORAL | Status: DC
  Administered 2021-06-16 – 2021-06-18 (×5): 1 via ORAL
  Filled 2021-06-16 (×5): qty 1

## 2021-06-16 MED ORDER — TRAMADOL HCL 50 MG PO TABS: 50.0000 mg | ORAL_TABLET | Freq: Once | ORAL | Status: DC

## 2021-06-16 MED ORDER — POTASSIUM CHLORIDE CRYS ER 20 MEQ PO TBCR
20.0000 meq | EXTENDED_RELEASE_TABLET | Freq: Once | ORAL | Status: AC
  Administered 2021-06-16: 20 meq via ORAL
  Filled 2021-06-16: qty 1

## 2021-06-16 NOTE — Progress Notes (Signed)
Mobility Specialist Progress Note    06/16/21 1108  Mobility  Bed Position Chair  Activity Ambulated independently in hallway  Level of Assistance Modified independent, requires aide device or extra time  Assistive Device Front wheel walker  Distance Ambulated (ft) 410 ft  Activity Response Tolerated fair  $Mobility charge 1 Mobility   Pre-Mobility: 90 HR, 130/84 BP, 97% SpO2 During Mobility: 104 HR Post-Mobility: 96 HR, 139/90 BP  Pt received in bed and agreeable. Pt SOB with exertion but had no complaints. Returned to chair with call bell in reach. Ambulated on RA.   [facility] Mobility Specialist  M.S. 2C and 6E: [phone] M.S. 4E: [phone]

## 2021-06-16 NOTE — Progress Notes (Signed)
° °   °  [address]       [place] [address]             [phone]      6 Days Post-Op Procedure(s) (LRB): CORONARY ARTERY BYPASS GRAFTING (CABG) TIMES FOUR, USING LEFT INTERNAL MAMMARY ARTERY AND RIGHT GREATER SAPHENOUS VEIN HARVESTED ENDOSCOPICALLY (N/A) TRANSESOPHAGEAL ECHOCARDIOGRAM (TEE) (N/A) APPLICATION OF CELL SAVER (N/A) ENDOVEIN HARVEST OF GREATER SAPHENOUS VEIN (Right) Subjective: Sitting up in bed. No new complaints or concerns.    Milrinone at 0.53mcg/kg/min CoOx 57  Objective: Vital signs in last 24 hours: Temp:  [97.6 F (36.4 C)-98.5 F (36.9 C)] 98 F (36.7 C) (01/26 0752) Pulse Rate:  [85-95] 95 (01/26 0752) Cardiac Rhythm: Normal sinus rhythm (01/26 0710) Resp:  [14-19] 17 (01/26 0752) BP: (114-124)/(74-85) 119/83 (01/26 0752) SpO2:  [92 %-97 %] 96 % (01/26 0752) Weight:  [74.6 kg] 74.6 kg (01/26 0300)  Hemodynamic parameters for last 24 hours:    Intake/Output from previous day: 01/25 0701 - 01/26 0700 In: 955.1 [P.O.:838; I.V.:117.1] Out: 3000 [Urine:3000] Intake/Output this shift: Total I/O In: 360 [P.O.:360] Out: -   General appearance: alert, cooperative, and no distress Neurologic: intact Heart: RRR, few PVC's Lungs: breath sounds clear to auscultation.  Abdomen: soft, NT.  Extremities: no peripheral edema. Bilateral LE EVH incisions are intact and dry. Wound: The sternotomy incision is dry and intact.   Lab Results: Recent Labs    06/15/21 0352 06/16/21 0207  WBC 4.8 5.4  HGB 10.3* 11.0*  HCT 31.1* 32.5*  PLT 219 244    BMET:  Recent Labs    06/15/21 0352 06/16/21 0207  NA 129* 136  K 4.2 3.8  CL 91* 89*  CO2 32 34*  GLUCOSE 99 108*  BUN 25* 30*  CREATININE 1.25* 1.12  CALCIUM 8.5* 9.3     PT/INR: No results for input(s): LABPROT, INR in the last 72 hours. ABG    Component Value Date/Time   PHART 7.384 06/10/2021 1827   HCO3 23.9 06/10/2021 1827   TCO2 25 06/10/2021 1827   ACIDBASEDEF 1.0  06/10/2021 1827   O2SAT 57.0 06/16/2021 0207   CBG (last 3)  Recent Labs    06/15/21 1648 06/15/21 2105 06/16/21 0628  GLUCAP 136* 142* 151*      Assessment/Plan: S/P Procedure(s) (LRB): CORONARY ARTERY BYPASS GRAFTING (CABG) TIMES FOUR, USING LEFT INTERNAL MAMMARY ARTERY AND RIGHT GREATER SAPHENOUS VEIN HARVESTED ENDOSCOPICALLY (N/A) TRANSESOPHAGEAL ECHOCARDIOGRAM (TEE) (N/A) APPLICATION OF CELL SAVER (N/A) ENDOVEIN HARVEST OF GREATER SAPHENOUS VEIN (Right)  -Postop day 6 CABG x4 after presenting with acute systolic heart failure and low EF.  Overall continuing to progress.  On milrinone at 0.2 mcg/kg/min and will be reduced to 0.23mcg this morning per cardiology. OK to remove pacer wires.   -Volume excess-brisk diuresis yesterday with concurrent 5 kg weight loss.  He has been converted to oral Lasix today.  -Renal-Baseline renal function was normal.  Creatinine bumped up to 1.25 but is trending back down.   -Expected acute blood loss anemia and postoperative thrombocytopenia-hematocrit and platelet count have recovered.  -Pulm-maintaining adequate oxygen saturation on room air.  No unexpected changes on chest x-ray.  Continue ambulation and lung work.  -Endo-type 2 diabetes mellitus, admission hemoglobin A1c was 10.7.  Glucose control is adequate on current Levemir 25 units subcu twice daily and insulin sliding scale.   LOS: 10 days    Antony Odea, [place] [phone] 06/16/2021

## 2021-06-16 NOTE — Progress Notes (Addendum)
Advanced Heart Failure Rounding Note  PCP-Cardiologist: None   Subjective:    CORONARY ARTERY BYPASS GRAFTING x 4 on 1/20 -LIMA to LAD -SVG to PL -SVG to OM -SVG to RAMUS INTERMEDIATE  IV lasix stopped 01/25.  Co-ox 57% on milrinone 0.2.  3L UOP charted yesterday. Weight down 5 lb overnight and 18 lb overall.   Scr 1.12. K 3.8.   BP stable.  Notes intermittent nausea since surgery. Chest sore. No dyspnea. Has been ambulating halls.  Objective:   Weight Range: 74.6 kg Body mass index is 25.01 kg/m.   Vital Signs:   Temp:  [97.6 F (36.4 C)-98.6 F (37 C)] 98.1 F (36.7 C) (01/26 0300) Pulse Rate:  [85-94] 87 (01/26 0300) Resp:  [11-19] 18 (01/26 0300) BP: (114-124)/(74-85) 121/74 (01/26 0300) SpO2:  [92 %-97 %] 92 % (01/26 0300) Weight:  [74.6 kg] 74.6 kg (01/26 0300) Last BM Date: 06/14/21  Weight change: Filed Weights   06/14/21 0500 06/15/21 0626 06/16/21 0300  Weight: 82.4 kg 77 kg 74.6 kg    Intake/Output:   Intake/Output Summary (Last 24 hours) at 06/16/2021 0658 Last data filed at 06/16/2021 0300 Gross per 24 hour  Intake 1195.14 ml  Output 3000 ml  Net -1804.86 ml      Physical Exam   General:  No distress. Sitting up in bed. HEENT: normal Neck: supple. no JVD. Carotids 2+ bilat; no bruits. No lymphadenopathy or thryomegaly appreciated. Cor: PMI nondisplaced. Regular rate & rhythm. Sternum stable. No rubs, gallops or murmurs. Lungs: clear Abdomen: soft, nontender, nondistended. No hepatosplenomegaly.  Extremities: no cyanosis, clubbing, rash, edema, + RUE PICC Neuro: alert & orientedx3, cranial nerves grossly intact. moves all 4 extremities w/o difficulty. Affect pleasant    Telemetry   SR 80s, rare PVCs  Labs    CBC Recent Labs    06/15/21 0352 06/16/21 0207  WBC 4.8 5.4  HGB 10.3* 11.0*  HCT 31.1* 32.5*  MCV 86.9 84.0  PLT 219 694   Basic Metabolic Panel Recent Labs    06/14/21 0353 06/15/21 0352 06/16/21 0207   NA 133* 129* 136  K 4.1 4.2 3.8  CL 98 91* 89*  CO2 29 32 34*  GLUCOSE 110* 99 108*  BUN 24* 25* 30*  CREATININE 1.04 1.25* 1.12  CALCIUM 7.3* 8.5* 9.3  MG 2.2  --   --    Liver Function Tests No results for input(s): AST, ALT, ALKPHOS, BILITOT, PROT, ALBUMIN in the last 72 hours.  No results for input(s): LIPASE, AMYLASE in the last 72 hours. Cardiac Enzymes No results for input(s): CKTOTAL, CKMB, CKMBINDEX, TROPONINI in the last 72 hours.  BNP: BNP (last 3 results) No results for input(s): BNP in the last 8760 hours.  ProBNP (last 3 results) No results for input(s): PROBNP in the last 8760 hours.   D-Dimer No results for input(s): DDIMER in the last 72 hours. Hemoglobin A1C No results for input(s): HGBA1C in the last 72 hours.  Fasting Lipid Panel No results for input(s): CHOL, HDL, LDLCALC, TRIG, CHOLHDL, LDLDIRECT in the last 72 hours.  Thyroid Function Tests No results for input(s): TSH, T4TOTAL, T3FREE, THYROIDAB in the last 72 hours.  Invalid input(s): FREET3   Other results:   Imaging    No results found.   Medications:     Scheduled Medications:  sodium chloride   Intravenous Once   aspirin EC  325 mg Oral Daily   Or   aspirin  324 mg Per  Tube Daily   atorvastatin  80 mg Oral Q2000   bisacodyl  10 mg Oral Daily   Or   bisacodyl  10 mg Rectal Daily   Chlorhexidine Gluconate Cloth  6 each Topical Daily   digoxin  0.125 mg Oral Daily   docusate sodium  200 mg Oral Daily   enoxaparin (LOVENOX) injection  40 mg Subcutaneous QHS   furosemide  40 mg Oral Daily   insulin aspart  0-24 Units Subcutaneous TID WC   insulin aspart  0-5 Units Subcutaneous QHS   insulin detemir  25 Units Subcutaneous BID   melatonin  5 mg Oral QHS   mometasone-formoterol  2 puff Inhalation BID   mupirocin cream   Topical BID   nicotine  14 mg Transdermal Daily   pantoprazole  40 mg Oral Daily   sodium chloride flush  10-40 mL Intracatheter Q12H   sodium chloride  flush  3 mL Intravenous Q12H   spironolactone  12.5 mg Oral Daily    Infusions:  sodium chloride Stopped (06/11/21 0905)   sodium chloride 250 mL (06/11/21 1414)   sodium chloride     milrinone 0.2 mcg/kg/min (06/16/21 0634)    PRN Medications: sodium chloride, dextrose, metoprolol tartrate, ondansetron (ZOFRAN) IV, oxyCODONE, prochlorperazine, sodium chloride flush, sodium chloride flush, traMADol    Patient Profile   56 y/o male w/ multiple cardiac risk factors, HTN, HLD, tobacco abuse and T2DM, admitted w/ new systolic heart failure. Echo at [place], EF 20-25%, RV mildly reduced. Transferred to [facility]. R/LHC showed severe 3v CAD, ischemic CM EF ~25-30%, well compensated filling pressures and preserved CO.     Assessment/Plan   1. Acute Systolic Heart Failure (New)/ Ischemic CM  - Echo at [facility]: LVEF 25-30%, global HK. RV mildly reduced  - Echo [facility] 06/06/21 EF 25-30%, RV normal  - Hs trop negative x 3. COVID and Flu negative - LHC w/ severe 3V CAD - s/p CABG x 4 on 1/20. - Co-ox 57% on milrinone 0.2. Decrease milrinone to 0.1 today.  - Back to pre-op weight. Off IV lasix. Continue po furosemide 40 mg daily. Supp K.  - Continue digoxin 0.125 - Continue Spiro 12.5 mg daily  - Add entresto 24/26 mg BID - Consider SGLT2i prior to discharge - Continue to ambulate  2. CAD - LHC w/ severe 3V CAD s/p CABG x 4 1/20 (LIMA to LAD, SVG to PL, SVG to OM, SVG to RI) - ASA 81 mg  - Atorva 80 mg  - Hold b-blocker until off milrinone  - ambulate w/ CR   3. AKI - SCr 1.4 at [facility], baseline unknown - Scr 1.21>1.04>1.24>1.12   4. Pancreatic Lesion - incidental finding of a 2.2 x 0.8 cm hypervascular structure in the body of the pancreas noted on CT - MRI shows benign thrombosed vessel (had remote h/o severe pancreatitis)  4. Type 2DM, poorly controlled - Hgb A1c 10.7 - SSI  - would benefit from SGLT2i  - appreciate DM coordinator's recs   5. Tobacco Abuse - smoking  cessation advised  - nicotine patch   6. Constipation  - resolved w/ sorbitol   7. Hyponatremia - Na improved to 136 today - monitor   Length of Stay: [place], [place], PA-C  06/16/2021, 6:58 AM  Advanced Heart Failure Team Pager [phone] (M-F; 7a - 5p)  Please contact [facility] for night-coverage after hours (5p -7a ) and weekends on [email or web address]  Patient seen and examined with the above-signed  Advanced Practice Provider and/or Housestaff. I personally reviewed laboratory data, imaging studies and relevant notes. I independently examined the patient and formulated the important aspects of the plan. I have edited the note to reflect any of my changes or salient points. I have personally discussed the plan with the patient and/or family.  Remains on milrinone. Diuresed well. Breathing better but gets nauseated when walking. Had BM this am.   General:  Lying in bed. No resp difficulty HEENT: normal Neck: supple. no JVD. Carotids 2+ bilat; no bruits. No lymphadenopathy or thryomegaly appreciated. Cor: Sternal wound ok Regular rate & rhythm. No rubs, gallops or murmurs. Lungs: clear Abdomen: soft, nontender, nondistended. No hepatosplenomegaly. No bruits or masses. Good bowel sounds. Extremities: no cyanosis, clubbing, rash, edema Neuro: alert & orientedx3, cranial nerves grossly intact. moves all 4 extremities w/o difficulty. Affect pleasant  Remains on milrinone. Volume status looks good. Agree with Entresto. Cut milrinone to 0.1. Ambulate.   Glori Bickers, MD  1:15 PM

## 2021-06-16 NOTE — TOC CM/SW Note (Addendum)
HF TOC CM spoke to [name] in [place] office # [phone], fax [phone] rep, [name] with new referral. Faxed HH orders to rep. Will fax dc summary at dc. Provided pt with [facility] copay card. [name] RN3 CCM, Heart Failure TOC CM [phone]   HF TOC CM spoke to pt and states he will be going to his girlfriend's home, gave permission to give [name] a call. [facility] does not service his area in [place]. Contacted SO, [name]. States her address is [address], [place] [address]. Contacted [facility] to make aware of new location. [facility] rep. Stacie states they are unable to service that area. [place], Heart Failure TOC CM [phone]

## 2021-06-16 NOTE — TOC Benefit Eligibility Note (Addendum)
Patient Teacher, English as a foreign language completed.    The patient is currently admitted and upon discharge could be taking Entresto 24-26 mg.  The current 30 day co-pay is, $45.00.   The patient is currently admitted and upon discharge could be taking Farxiga 10 mg.  The current 30 day co-pay is, $45.00.    The patient is insured through [employer] [place], [place] Patient Advocate Specialist [place] Patient Advocate Team Direct Number: [phone]  Fax: [phone]

## 2021-06-16 NOTE — Plan of Care (Signed)
°  Problem: Health Behavior/Discharge Planning: Goal: Ability to manage health-related needs will improve Outcome: Progressing   Problem: Clinical Measurements: Goal: Ability to maintain clinical measurements within normal limits will improve Outcome: Progressing Goal: Will remain free from infection Outcome: Progressing Goal: Diagnostic test results will improve Outcome: Progressing Goal: Cardiovascular complication will be avoided Outcome: Progressing

## 2021-06-17 ENCOUNTER — Inpatient Hospital Stay (HOSPITAL_COMMUNITY): Payer: BC Managed Care – PPO

## 2021-06-17 ENCOUNTER — Other Ambulatory Visit (HOSPITAL_COMMUNITY): Payer: Self-pay

## 2021-06-17 DIAGNOSIS — Z48812 Encounter for surgical aftercare following surgery on the circulatory system: Secondary | ICD-10-CM | POA: Insufficient documentation

## 2021-06-17 DIAGNOSIS — I5021 Acute systolic (congestive) heart failure: Secondary | ICD-10-CM | POA: Diagnosis not present

## 2021-06-17 LAB — BASIC METABOLIC PANEL
Anion gap: 10 (ref 5–15)
BUN: 34 mg/dL — ABNORMAL HIGH (ref 6–20)
CO2: 29 mmol/L (ref 22–32)
Calcium: 9.1 mg/dL (ref 8.9–10.3)
Chloride: 94 mmol/L — ABNORMAL LOW (ref 98–111)
Creatinine, Ser: 1.29 mg/dL — ABNORMAL HIGH (ref 0.61–1.24)
GFR, Estimated: >60 mL/min (ref >60)
Glucose, Bld: 107 mg/dL — ABNORMAL HIGH (ref 70–99)
Potassium: 4.3 mmol/L (ref 3.5–5.1)
Sodium: 133 mmol/L — ABNORMAL LOW (ref 135–145)

## 2021-06-17 LAB — CBC
HCT: 34.3 % — ABNORMAL LOW (ref 39.0–52.0)
Hemoglobin: 11.5 g/dL — ABNORMAL LOW (ref 13.0–17.0)
MCH: 28.4 pg (ref 26.0–34.0)
MCHC: 33.5 g/dL (ref 30.0–36.0)
MCV: 84.7 fL (ref 80.0–100.0)
Platelets: 257 10*3/uL (ref 150–400)
RBC: 4.05 MIL/uL — ABNORMAL LOW (ref 4.22–5.81)
RDW: 13 % (ref 11.5–15.5)
WBC: 7.2 10*3/uL (ref 4.0–10.5)
nRBC: 0 % (ref 0.0–0.2)

## 2021-06-17 LAB — COOXEMETRY PANEL
Carboxyhemoglobin: 1.6 % — ABNORMAL HIGH (ref 0.5–1.5)
Methemoglobin: 0.9 % (ref 0.0–1.5)
O2 Saturation: 63.2 %
Total hemoglobin: 11.7 g/dL — ABNORMAL LOW (ref 12.0–16.0)

## 2021-06-17 LAB — GLUCOSE, CAPILLARY
Glucose-Capillary: 95 mg/dL (ref 70–99)
Glucose-Capillary: 160 mg/dL — ABNORMAL HIGH (ref 70–99)
Glucose-Capillary: 165 mg/dL — ABNORMAL HIGH (ref 70–99)
Glucose-Capillary: 122 mg/dL — ABNORMAL HIGH (ref 70–99)

## 2021-06-17 MED ORDER — ACETAMINOPHEN 325 MG PO TABS
650.0000 mg | ORAL_TABLET | ORAL | Status: DC | PRN
  Administered 2021-06-17 (×2): 650 mg via ORAL
  Filled 2021-06-17 (×2): qty 2

## 2021-06-17 MED ORDER — DAPAGLIFLOZIN PROPANEDIOL 10 MG PO TABS
10.0000 mg | ORAL_TABLET | Freq: Every day | ORAL | Status: DC
  Administered 2021-06-17 – 2021-06-18 (×2): 10 mg via ORAL
  Filled 2021-06-17 (×2): qty 1

## 2021-06-17 NOTE — Progress Notes (Signed)
Pacer wire removal: Procedure described to patient. One stitch from right and left pacer wire removed. Patient took deep breath and pacer wires pulled out without complication. Patient tolerated procedure well. Continue to monitor.

## 2021-06-17 NOTE — Progress Notes (Addendum)
Advanced Heart Failure Rounding Note  PCP-Cardiologist: None   Subjective:    CORONARY ARTERY BYPASS GRAFTING x 4 on 1/20 -LIMA to LAD, -SVG to PL, -SVG to OM, -SVG to RAMUS INTERMEDIATE  Yesterday milrinone cut back to 0.1 mcg and entresto started.   Had some nausea this morning but now resolved.  Denies SOB.   Objective:   Weight Range: 72.3 kg Body mass index is 24.24 kg/m.   Vital Signs:   Temp:  [97.8 F (36.6 C)-98.1 F (36.7 C)] 98.1 F (36.7 C) (01/27 0303) Pulse Rate:  [90-95] 90 (01/27 0303) Resp:  [16-19] 16 (01/27 0303) BP: (98-119)/(66-83) 106/69 (01/27 0303) SpO2:  [96 %-98 %] 98 % (01/27 0303) Weight:  [72.3 kg] 72.3 kg (01/27 0604) Last BM Date: 06/14/21  Weight change: Filed Weights   06/15/21 0626 06/16/21 0300 06/17/21 0604  Weight: 77 kg 74.6 kg 72.3 kg    Intake/Output:   Intake/Output Summary (Last 24 hours) at 06/17/2021 0730 Last data filed at 06/16/2021 2332 Gross per 24 hour  Intake 360 ml  Output 1000 ml  Net -640 ml      Physical Exam   General:  No resp difficulty HEENT: normal Neck: supple. no JVD. Carotids 2+ bilat; no bruits. No lymphadenopathy or thryomegaly appreciated. Cor: PMI nondisplaced. Regular rate & rhythm. No rubs, gallops or murmurs.  Lungs: clear Abdomen: soft, nontender, nondistended. No hepatosplenomegaly. No bruits or masses. Good bowel sounds. Extremities: no cyanosis, clubbing, rash, edema. RUE PICC  Neuro: alert & orientedx3, cranial nerves grossly intact. moves all 4 extremities w/o difficulty. Affect pleasant    Telemetry   SR 80-90s   Labs    CBC Recent Labs    06/16/21 0207 06/17/21 0313  WBC 5.4 7.2  HGB 11.0* 11.5*  HCT 32.5* 34.3*  MCV 84.0 84.7  PLT 244 354   Basic Metabolic Panel Recent Labs    06/16/21 0207 06/17/21 0313  NA 136 133*  K 3.8 4.3  CL 89* 94*  CO2 34* 29  GLUCOSE 108* 107*  BUN 30* 34*  CREATININE 1.12 1.29*  CALCIUM 9.3 9.1   Liver Function  Tests No results for input(s): AST, ALT, ALKPHOS, BILITOT, PROT, ALBUMIN in the last 72 hours.  No results for input(s): LIPASE, AMYLASE in the last 72 hours. Cardiac Enzymes No results for input(s): CKTOTAL, CKMB, CKMBINDEX, TROPONINI in the last 72 hours.  BNP: BNP (last 3 results) No results for input(s): BNP in the last 8760 hours.  ProBNP (last 3 results) No results for input(s): PROBNP in the last 8760 hours.   D-Dimer No results for input(s): DDIMER in the last 72 hours. Hemoglobin A1C No results for input(s): HGBA1C in the last 72 hours.  Fasting Lipid Panel No results for input(s): CHOL, HDL, LDLCALC, TRIG, CHOLHDL, LDLDIRECT in the last 72 hours.  Thyroid Function Tests No results for input(s): TSH, T4TOTAL, T3FREE, THYROIDAB in the last 72 hours.  Invalid input(s): FREET3   Other results:   Imaging    No results found.   Medications:     Scheduled Medications:  sodium chloride   Intravenous Once   aspirin EC  325 mg Oral Daily   Or   aspirin  324 mg Per Tube Daily   atorvastatin  80 mg Oral Q2000   bisacodyl  10 mg Oral Daily   Or   bisacodyl  10 mg Rectal Daily   Chlorhexidine Gluconate Cloth  6 each Topical Daily   digoxin  0.125 mg Oral Daily   docusate sodium  200 mg Oral Daily   enoxaparin (LOVENOX) injection  40 mg Subcutaneous QHS   furosemide  40 mg Oral Daily   insulin aspart  0-24 Units Subcutaneous TID WC   insulin aspart  0-5 Units Subcutaneous QHS   insulin detemir  25 Units Subcutaneous BID   melatonin  5 mg Oral QHS   mometasone-formoterol  2 puff Inhalation BID   mupirocin cream   Topical BID   nicotine  14 mg Transdermal Daily   pantoprazole  40 mg Oral Daily   sacubitril-valsartan  1 tablet Oral BID   sodium chloride flush  10-40 mL Intracatheter Q12H   sodium chloride flush  3 mL Intravenous Q12H   spironolactone  12.5 mg Oral Daily   traMADol  50 mg Oral Once    Infusions:  sodium chloride Stopped (06/11/21 0905)    sodium chloride 250 mL (06/11/21 1414)   sodium chloride     milrinone 0.1 mcg/kg/min (06/16/21 0935)    PRN Medications: sodium chloride, dextrose, metoprolol tartrate, ondansetron (ZOFRAN) IV, oxyCODONE, prochlorperazine, sodium chloride flush, sodium chloride flush, traMADol    Patient Profile   56 y/o male w/ multiple cardiac risk factors, HTN, HLD, tobacco abuse and T2DM, admitted w/ new systolic heart failure. Echo at [place], EF 20-25%, RV mildly reduced. Transferred to [facility]. R/LHC showed severe 3v CAD, ischemic CM EF ~25-30%, well compensated filling pressures and preserved CO.     Assessment/Plan   1. Acute Systolic Heart Failure (New)/ Ischemic CM  - Echo at [facility]: LVEF 25-30%, global HK. RV mildly reduced  - Echo [facility] 06/06/21 EF 25-30%, RV normal  - Hs trop negative x 3. COVID and Flu negative - LHC w/ severe 3V CAD - s/p CABG x 4 on 1/20. - Co-ox 63% . Stop milrinone.  - Stop lasix. Start farxiga 10 mg daily.  - Continue digoxin 0.125 - Continue Spiro 12.5 mg daily  - Continue entresto 24/26 mg BID  2. CAD - LHC w/ severe 3V CAD s/p CABG x 4 1/20 (LIMA to LAD, SVG to PL, SVG to OM, SVG to RI) - ASA 81 mg  - Atorva 80 mg  - Hold b-blocker until off milrinone  - ambulate w/ CR   3. AKI - SCr 1.4 at [facility], baseline unknown - Scr  1.3 today.   4. Pancreatic Lesion - incidental finding of a 2.2 x 0.8 cm hypervascular structure in the body of the pancreas noted on CT - MRI shows benign thrombosed vessel (had remote h/o severe pancreatitis)  4. Type 2DM, poorly controlled - Hgb A1c 10.7 - SSI  - would benefit from SGLT2i  - appreciate DM coordinator's recs   5. Tobacco Abuse - smoking cessation advised  - nicotine patch   6. Constipation  - resolved w/ sorbitol   7. Hyponatremia -Sodium 133 - Daily BMET  We will set up f/u in HF clinic.   Length of Stay: [place], NP  06/17/2021, 7:30 AM  Advanced Heart Failure Team Pager [phone]  (M-F; 7a - 5p)  Please contact [facility] for night-coverage after hours (5p -7a ) and weekends on [email or web address]  Patient seen and examined with the above-signed Advanced Practice Provider and/or Housestaff. I personally reviewed laboratory data, imaging studies and relevant notes. I independently examined the patient and formulated the important aspects of the plan. I have edited the note to reflect any of my changes or salient points. I  have personally discussed the plan with the patient and/or family.  Remains on milrinone 0.1. Co-ox 63%. Volume ok.   Doing better. Still with occasional am nausea but otherwise improved.  General:  Well appearing. No resp difficulty HEENT: normal Neck: supple. no JVD. Carotids 2+ bilat; no bruits. No lymphadenopathy or thryomegaly appreciated. Cor: Sternal wound ok. Regular rate & rhythm. No rubs, gallops or murmurs. Lungs: clear Abdomen: soft, nontender, nondistended. No hepatosplenomegaly. No bruits or masses. Good bowel sounds. Extremities: no cyanosis, clubbing, rash, edema Neuro: alert & orientedx3, cranial nerves grossly intact. moves all 4 extremities w/o difficulty. Affect pleasant  Stop milrinone and po lasix. Start [place]. Follow co-ox tomorrow. If co-ox stable (>= 55%) likely home on Sunday.   Glori Bickers, MD  8:31 AM

## 2021-06-17 NOTE — Progress Notes (Signed)
Second attempt for ambulation, pt continues to c/o nausea. Began discussing ed including IS, sternal precaution, walking and CRPII. Pt flat, disengaged. Will return tomorrow for more education. Pt sts he will be staying in [place] long term therefore will refer to [employer] [facility] CES, ACSM 2:26 PM 06/17/2021

## 2021-06-17 NOTE — Discharge Summary (Addendum)
Physician Discharge Summary  Patient ID: Kenneth Bailey MRN: [id number] DOB/AGE: 55-Nov-1967 55 y.o.  Admit date: 06/06/2021 Discharge date: 06/18/2021  Admission Diagnoses:  Coronary artery disease Acute systolic heart failure (HCC) Type 2 diabetes mellitus Acute coronary syndrome Dyslipidemia COPD Tobacco abuse Hypertension   Discharge Diagnoses:   Coronary artery disease Acute systolic heart failure (HCC) Type 2 diabetes mellitus Dyslipidemia COPD Tobacco abuse Hypertension S/P CABG x 4 Expected acute blood loss anemia  Discharged Condition: good  History of Present Illness:  Kenneth Bailey is a 56 yo male with recent history of approximately 2 weeks of progressive exertional dyspnea, orthopnea, PND as well as 15 pound weight gain.  He originally had an event where he developed significant chest pain that was both prolonged and somewhat intermittent.  He kept working through the discomfort and it gradually dissipated.  He presented to [facility] last Saturday and was medically stabilized. He was transferred to [facility] for further evaluation including cardiac cath. .  He was noted to have a significantly elevated BNP of 4100 with negative high-sensitivity troponins on initial presentation to [facility].Kenneth Bailey  COVID and flu test were negative.  EKG was unremarkable and showed normal sinus rhythm.  Chest CT was negative for PE but did show coronary artery calcification and slightly enlarged mediastinal lymph nodes.  It also showed small to moderate bilateral pleural effusions and incidental finding of a 2.2 x 0.8 cm h hyper vascular structure in the body of the pancreas.  Echocardiogram was performed and showed LVEF 25 to 30% with global hypokinesis, RV was mildly reduced.  He has multiple known cardiac risk factors including insulin-dependent diabetes, hypertension, hyperlipidemia as well as tobacco abuse/COPD.  Cardiac catheterization was performed on 06/06/2021 and revealed severe  three-vessel coronary artery disease.  The left main is also noted to be 50% occluded in the distal portion of the vessel.  It was felt coronary bypass grafting would be indicated and cardiothoracic consultation was requested.  The patient was evaluated by Dr. Prescott Gum who was in agreement the patient would benefit from coronary bypass grafting procedure.  The risks and benefits of the procedure were explained to the patient and he was agreeable to proceed.  Hospital Course:  Mr. Turck remained chest pain free during his hospitalization.  He was aggressively diuresed prior to proceeding with surgery.  MRI was obtained for the abnormality in his pancreas.  This showed a focal area of relative hypoenhancement measuring 2.2 x 1.0 cm.  They felt that the appearance of the area was the typical appearance of Neoplasm.  However a dedicated pancreas protocol CT is recommended.  The patient was taken to the operating room on 06/10/2021.  He underwent CABG x 4 utilizing LIMA to LAD, SVG to OM, SVG Ramus Intermediate, and SVG to PL.  He also underwent endoscopic harvest of the greater saphenous vein from his right leg and left thigh.  He tolerated the procedure and was taken to the SICU in stable condition.   Postoperative hospital course:  Patient has remained neurologically intact.  He has maintained sinus rhythm.  Cardiac indices were good initially on milrinone, epinephrine, Levophed and Neo-Synephrine.  These medications were slowly titrated off over time.  The advanced heart failure team was consulted to assist with management.  He had an expected postoperative volume overload and was diuresed.  Creatinine has remained normal.  He had an expected acute blood loss anemia which is being monitored clinically.  He did not require transfusion intraoperatively as  well as the given FFP due to some coagulation dysfunction.  His coagulopathy was corrected. He was ready for transfer to [place] on the 3rd  post-op day. He responded well to diuresis. The milrinone was managed by the advanced heart failure team and was weaned off slowly by the 7th post-op day. Spironolactone, digoxin, and Farxiga were added.  Diet and activity were advanced routinely and were well tolerated. The CoOx level was followed daily. He was felt to be ready for discharge on 06/18/2021  Consults:  Advanced heart failure  Significant Diagnostic Studies: C  LINICAL DATA:  Sore chest, recent CABG   EXAM: PORTABLE CHEST 1 VIEW   COMPARISON:  Chest radiograph 06/14/2021   FINDINGS: The right IJ vascular sheath has been removed. There is a new right upper extremity PICC with the tip terminating in the lower SVC.   The cardiomediastinal silhouette is stable, with unchanged cardiomegaly. Median sternotomy wires are again noted.   Lung volumes are low. There is a small left pleural effusion with adjacent opacity likely reflecting atelectasis, not significantly changed. Otherwise, there is no new or worsening focal airspace disease. There is no significant right effusion. There is no appreciable pneumothorax.   There is no acute osseous abnormality.   IMPRESSION: Small left pleural effusion with adjacent opacity likely reflecting atelectasis, overall not significantly changed in the interim. No new or worsening focal airspace disease. No appreciable pneumothorax.     Electronically Signed   By: Valetta Mole M.D.   On: 06/15/2021 08:41  Treatments: Surgery  Operative Report    DATE OF PROCEDURE: 06/10/2021   OPERATION:   1.  Coronary artery bypass grafting x 4 (left internal mammary artery to LAD, saphenous vein graft to posterolateral branch of the RCA, saphenous vein graft to OM1, saphenous vein graft to ramus intermediate). 2.  Endoscopic harvest of bilateral leg saphenous vein.   SURGEON:  Len Childs, MD   ASSISTANT:  Providence Crosby, PA-C.  A surgical first assistant was required for this  procedure due to the standard of practice for this level of complexity.  The first assistant was also needed to endoscopically harvest the saphenous vein conduit and  prepare it for the anastomoses as well as closing the leg incisions.  The first assistant was needed to participate in dissection, suctioning, exposure, and organizing the anastomotic sutures for the coronary grafts as well as general assistance  throughout the procedure.   PREOPERATIVE DIAGNOSES:  Ischemic cardiomyopathy, severe 3-vessel coronary artery disease with low systolic function and ejection fraction.   POSTOPERATIVE DIAGNOSES:  Ischemic cardiomyopathy, severe 3-vessel coronary artery disease with low systolic function and ejection fraction.   ANESTHESIA:  General by Dr. Nolon Nations.   CLINICAL NOTE:  The patient is a 56 year old diabetic smoker who presented acutely with shortness of breath and chest discomfort.  Echocardiogram showed severe LV dysfunction and EKG showed nonspecific changes.  He was transferred from an outside  hospital to the cardiology service here with diagnosis of acute systolic heart failure.  Cardiac catheterization was performed because of his significant risk factors for coronary disease and low ejection fraction by echo.  This demonstrated severe  diffuse diabetic pattern of coronary artery disease with 50% stenosis of the left main and high-grade 90% stenosis of the RCA, circumflex marginal, and mid LAD.  Because of his diabetic 3-vessel disease and poor LV function, he was recommended for  surgical coronary revascularization.  I saw the patient in consultation after reviewing  the images of his cardiac catheterization and echocardiogram and agreed with the recommendation for surgical revascularization.  The patient understood that the  procedure would be done at increased risk due to his poor LV function.  While in the hospital being prepared for surgery, his symptoms of failure significantly  improved with medical therapy.  His chest x-ray showed resolution of the interstitial edema  and he maintained a sinus rhythm.  I reviewed the details of coronary artery bypass grafting with the patient including the use of general anesthesia and cardiopulmonary bypass, location of the surgical incisions, and the expected postoperative hospital  recovery.  I discussed with him the benefits of the procedure to include increased survival, improved symptoms and preservation of LV function.  I discussed the risks of the procedure to include stroke, bleeding, blood transfusion requirement, MI, organ  failure, infection, arrhythmias, and death.  He demonstrated his understanding and agreed to proceed with the surgery.  We also had a discussion about the potential need for mechanical support with a temporary Impella LVAD if his ventricular function was  not adequate after revascularization to separate from cardiopulmonary bypass.   OPERATIVE FINDINGS:   1.  Good conduit from the leg vein and mammary artery. 2.  Improvement in LV function with EF of 35% after separation from cardiopulmonary bypass - by TEE  Discharge Exam: Blood pressure 113/80, pulse 94, temperature 98 F (36.7 C), temperature source Oral, resp. rate 17, height 5\' 8"  (1.727 m), weight 70.9 kg, SpO2 96 %.  General appearance: alert, cooperative, and no distress Heart: regular rate and rhythm Lungs: clear to auscultation bilaterally Abdomen: soft, non-tender; bowel sounds normal; no masses,  no organomegaly Extremities: edema trace Wound: clean and dry  Disposition: Discharged to home in stable condition.  Discharge Instructions     Amb Referral to Cardiac Rehabilitation   Complete by: As directed    To [place]   Diagnosis: CABG   CABG X ___: 4   After initial evaluation and assessments completed: Virtual Based Care may be provided alone or in conjunction with Phase 2 Cardiac Rehab based on patient barriers.: Yes       Allergies as of 06/18/2021   Not on File      Medication List     TAKE these medications    acetaminophen 325 MG tablet Commonly known as: TYLENOL Take 2 tablets (650 mg total) by mouth every 4 (four) hours as needed for moderate pain.   aspirin 325 MG EC tablet Take 1 tablet (325 mg total) by mouth daily.   atorvastatin 80 MG tablet Commonly known as: LIPITOR Take 1 tablet (80 mg total) by mouth daily at 8 pm.   Basaglar KwikPen 100 UNIT/ML Inject 12-50 Units into the skin See admin instructions. Per sliding scale   COQ-10 PO Take 1 capsule by mouth daily.   dapagliflozin propanediol 10 MG Tabs tablet Commonly known as: FARXIGA Take 1 tablet (10 mg total) by mouth daily.   digoxin 0.125 MG tablet Commonly known as: LANOXIN Take 1 tablet (0.125 mg total) by mouth daily.   metFORMIN 1000 MG tablet Commonly known as: GLUCOPHAGE Take 1,000 mg by mouth 2 (two) times daily.   nicotine 14 mg/24hr patch Commonly known as: NICODERM CQ - dosed in mg/24 hours Place 1 patch (14 mg total) onto the skin daily.   sacubitril-valsartan 24-26 MG Commonly known as: ENTRESTO Take 1 tablet by mouth 2 (two) times daily.   spironolactone 25 MG tablet Commonly known as:  ALDACTONE Take 0.5 tablets (12.5 mg total) by mouth daily.   traMADol 50 MG tablet Commonly known as: ULTRAM Take 1 tablet (50 mg total) by mouth every 4 (four) hours as needed for up to 5 days for moderate pain.               Durable Medical Equipment  (From admission, onward)           Start     Ordered   06/16/21 1112  Heart failure home health orders  (Heart failure home health orders / Face to face)  Once       Comments: Heart Failure Follow-up Care:  Verify follow-up appointments per Patient Discharge Instructions. Confirm transportation arranged. Reconcile home medications with discharge medication list. Remove discontinued medications from use. Assist patient/caregiver to manage  medications using pill box. Reinforce low sodium food selection Assessments: Vital signs and oxygen saturation at each visit. Assess home environment for safety concerns, caregiver support and availability of low-sodium foods. Consult Education officer, museum, PT/OT, Dietitian, and CNA based on assessments. Perform comprehensive cardiopulmonary assessment. Notify MD for any change in condition or weight gain of 3 pounds in one day or 5 pounds in one week with symptoms. Daily Weights and Symptom Monitoring: Ensure patient has access to scales. Teach patient/caregiver to weigh daily before breakfast and after voiding using same scale and record.    Teach patient/caregiver to track weight and symptoms and when to notify Provider. Activity: Develop individualized activity plan with patient/caregiver.   Question Answer Comment  Heart Failure Follow-up Care Advanced Heart Failure (AHF) [facility] at [place] Visits Set up telemonitoring equipment to monitor daily vital signs, weights and oxygen saturation   Obtain the following labs Basic Metabolic Panel   Lab frequency Weekly   Fax lab results to AHF Clinic at [phone]   Diet Low Sodium Heart Healthy   Fluid restrictions: 1800 mL Fluid      06/16/21 1112            Follow-up Information     Dahlia Byes, MD. Go on 07/11/2021.   Specialty: Cardiothoracic Surgery Why: [facility] [address] [place], [place] [address] [phone]  Your appointment is at 2:30pm. Please arrive 30 min early for a chest x-ray to be performed by [facility] located on the first floor of the same building.        [facility] Follow up.   Why: Home Health RN-agency will call to arrange new appt Contact information: [facility] Follow up on 06/24/2021.   Specialty: Cardiology Why: at 3:00. Located at [place]. Bring all medications. Contact  information: [address] [id number] [place] [phone]               The patient has been discharged on:   1.Beta Blocker:  Yes [   ]                              No   [ x  ]                              If No, reason: low EF heart failure  2.Ace Inhibitor/ARB: Yes [  x ]  No  [    ]                                     If No, reason:  3.Statin:   Yes [x ]                  No  [   ]                  If No, reason:  4.Ecasa:  Yes  [ x  ]                  No   [   ]                  If No, reason:   Signed:  Note by Enid Cutter PA-C  Erin Barrett, PA-C 06/18/2021, 11:08 AM Patient examined at discharge instructions reviewed with patient and wife. patient examined and medical record reviewed,agree with above note. Dahlia Byes 06/18/2021

## 2021-06-17 NOTE — Progress Notes (Addendum)
[address] Gardens[address]       [place] [address]             [phone]      7 Days Post-Op Procedure(s) (LRB): CORONARY ARTERY BYPASS GRAFTING (CABG) TIMES FOUR, USING LEFT INTERNAL MAMMARY ARTERY AND RIGHT GREATER SAPHENOUS VEIN HARVESTED ENDOSCOPICALLY (N/A) TRANSESOPHAGEAL ECHOCARDIOGRAM (TEE) (N/A) APPLICATION OF CELL SAVER (N/A) ENDOVEIN HARVEST OF GREATER SAPHENOUS VEIN (Right) Subjective: Reports some nausea he believes is related to the Tramadol. No vomiting or abd pain.   Milrinone at 0.68mcg/kg/min CoOx 63  Objective: Vital signs in last 24 hours: Temp:  [97.8 F (36.6 C)-98.1 F (36.7 C)] 98.1 F (36.7 C) (01/27 0303) Pulse Rate:  [90-95] 90 (01/27 0303) Cardiac Rhythm: Normal sinus rhythm (01/27 0708) Resp:  [16-19] 16 (01/27 0303) BP: (98-119)/(66-83) 106/69 (01/27 0303) SpO2:  [93 %-98 %] 93 % (01/27 0742) Weight:  [72.3 kg] 72.3 kg (01/27 0604)  Hemodynamic parameters for last 24 hours:    Intake/Output from previous day: 01/26 0701 - 01/27 0700 In: 360 [P.O.:360] Out: 1000 [Urine:1000] Intake/Output this shift: No intake/output data recorded.  General appearance: alert, cooperative, and no distress Neurologic: intact Heart: RRR, few PVC's Lungs: breath sounds clear to auscultation.  Abdomen: soft, NT.  Extremities: no peripheral edema. Bilateral LE EVH incisions are intact and dry. Wound: The sternotomy incision is dry and intact.   Lab Results: Recent Labs    06/16/21 0207 06/17/21 0313  WBC 5.4 7.2  HGB 11.0* 11.5*  HCT 32.5* 34.3*  PLT 244 257    BMET:  Recent Labs    06/16/21 0207 06/17/21 0313  NA 136 133*  K 3.8 4.3  CL 89* 94*  CO2 34* 29  GLUCOSE 108* 107*  BUN 30* 34*  CREATININE 1.12 1.29*  CALCIUM 9.3 9.1     PT/INR: No results for input(s): LABPROT, INR in the last 72 hours. ABG    Component Value Date/Time   PHART 7.384 06/10/2021 1827   HCO3 23.9 06/10/2021 1827   TCO2 25 06/10/2021 1827    ACIDBASEDEF 1.0 06/10/2021 1827   O2SAT 63.2 06/17/2021 0313   CBG (last 3)  Recent Labs    06/16/21 1620 06/16/21 2124 06/17/21 0600  GLUCAP 151* 136* 95      Assessment/Plan: S/P Procedure(s) (LRB): CORONARY ARTERY BYPASS GRAFTING (CABG) TIMES FOUR, USING LEFT INTERNAL MAMMARY ARTERY AND RIGHT GREATER SAPHENOUS VEIN HARVESTED ENDOSCOPICALLY (N/A) TRANSESOPHAGEAL ECHOCARDIOGRAM (TEE) (N/A) APPLICATION OF CELL SAVER (N/A) ENDOVEIN HARVEST OF GREATER SAPHENOUS VEIN (Right)  -Postop day 7 CABG x4 after presenting with acute systolic heart failure and low EF.  Overall continuing to progress.  On milrinone at 0.1 mcg/kg/min and will be discontinued morning per cardiology. D/C pacer wires today.   -Volume excess-brisk diuresis yesterday with concurrent 5 kg weight loss.  He has been converted to oral Lasix today.  -Renal-Baseline renal function was normal.  Creatinine 1.29 today. Good UO and Wt is trending down.  Diuresis per cardiology.  -Expected acute blood loss anemia and postoperative thrombocytopenia-hematocrit and platelet count have recovered and are stable.  -Pulm-maintaining adequate oxygen saturation on room air.  No unexpected changes on chest x-ray.  Continue ambulation and lung work.  -Endo-type 2 diabetes mellitus, admission hemoglobin A1c was 10.7.  Glucose control is adequate on current Levemir 25 units subcu twice daily and insulin sliding scale. Farxiga added yesterday.  -Disposition- Milrinone to be discontinue today. Anticipate discharge to home tomorrow if cardiology agrees.  LOS: 11 days    Kenneth Bailey, [place] [phone] 06/17/2021  patient examined and medical record reviewed,agree with above note. Dahlia Byes 06/17/2021

## 2021-06-17 NOTE — Plan of Care (Signed)
°  Problem: Health Behavior/Discharge Planning: Goal: Ability to manage health-related needs will improve Outcome: Progressing   Problem: Clinical Measurements: Goal: Ability to maintain clinical measurements within normal limits will improve Outcome: Progressing Goal: Will remain free from infection Outcome: Progressing Goal: Diagnostic test results will improve Outcome: Progressing Goal: Cardiovascular complication will be avoided Outcome: Progressing   Problem: Activity: Goal: Capacity to carry out activities will improve Outcome: Progressing

## 2021-06-18 DIAGNOSIS — I5021 Acute systolic (congestive) heart failure: Secondary | ICD-10-CM | POA: Diagnosis not present

## 2021-06-18 LAB — BASIC METABOLIC PANEL
Anion gap: 9 (ref 5–15)
BUN: 37 mg/dL — ABNORMAL HIGH (ref 6–20)
CO2: 28 mmol/L (ref 22–32)
Calcium: 9.3 mg/dL (ref 8.9–10.3)
Chloride: 97 mmol/L — ABNORMAL LOW (ref 98–111)
Creatinine, Ser: 1.08 mg/dL (ref 0.61–1.24)
GFR, Estimated: >60 mL/min (ref >60)
Glucose, Bld: 106 mg/dL — ABNORMAL HIGH (ref 70–99)
Potassium: 4.5 mmol/L (ref 3.5–5.1)
Sodium: 134 mmol/L — ABNORMAL LOW (ref 135–145)

## 2021-06-18 LAB — CBC
HCT: 35.9 % — ABNORMAL LOW (ref 39.0–52.0)
Hemoglobin: 12.1 g/dL — ABNORMAL LOW (ref 13.0–17.0)
MCH: 28 pg (ref 26.0–34.0)
MCHC: 33.7 g/dL (ref 30.0–36.0)
MCV: 83.1 fL (ref 80.0–100.0)
Platelets: 273 10*3/uL (ref 150–400)
RBC: 4.32 MIL/uL (ref 4.22–5.81)
RDW: 13.2 % (ref 11.5–15.5)
WBC: 9 10*3/uL (ref 4.0–10.5)
nRBC: 0 % (ref 0.0–0.2)

## 2021-06-18 LAB — COOXEMETRY PANEL
Carboxyhemoglobin: 1.6 % — ABNORMAL HIGH (ref 0.5–1.5)
Methemoglobin: 0.9 % (ref 0.0–1.5)
O2 Saturation: 55.1 %
Total hemoglobin: 12.2 g/dL (ref 12.0–16.0)

## 2021-06-18 LAB — GLUCOSE, CAPILLARY: Glucose-Capillary: 106 mg/dL — ABNORMAL HIGH (ref 70–99)

## 2021-06-18 MED ORDER — TRAMADOL HCL 50 MG PO TABS: 50.0000 mg | ORAL_TABLET | ORAL | 0 refills | Status: AC | PRN

## 2021-06-18 MED ORDER — NICOTINE 14 MG/24HR TD PT24: 14.0000 mg | MEDICATED_PATCH | Freq: Every day | TRANSDERMAL | 1 refills | Status: DC

## 2021-06-18 MED ORDER — ASPIRIN 325 MG PO TBEC: 325.0000 mg | DELAYED_RELEASE_TABLET | Freq: Every day | ORAL | 0 refills | Status: DC

## 2021-06-18 MED ORDER — DAPAGLIFLOZIN PROPANEDIOL 10 MG PO TABS: 10.0000 mg | ORAL_TABLET | Freq: Every day | ORAL | 2 refills | Status: DC

## 2021-06-18 MED ORDER — ATORVASTATIN CALCIUM 80 MG PO TABS: 80.0000 mg | ORAL_TABLET | Freq: Every day | ORAL | 2 refills | Status: DC

## 2021-06-18 MED ORDER — SPIRONOLACTONE 25 MG PO TABS: 12.5000 mg | ORAL_TABLET | Freq: Every day | ORAL | 2 refills | Status: DC

## 2021-06-18 MED ORDER — SACUBITRIL-VALSARTAN 24-26 MG PO TABS: 1.0000 | ORAL_TABLET | Freq: Two times a day (BID) | ORAL | 2 refills | Status: DC

## 2021-06-18 MED ORDER — DIGOXIN 125 MCG PO TABS: 0.1250 mg | ORAL_TABLET | Freq: Every day | ORAL | 2 refills | Status: DC

## 2021-06-18 MED ORDER — ACETAMINOPHEN 325 MG PO TABS: 650.0000 mg | ORAL_TABLET | ORAL | Status: DC | PRN

## 2021-06-18 NOTE — Progress Notes (Signed)
Patient discharge instructions reviewed and patient and sig other verbalize understanding. Patient via wheel chair to waiting car in stable condition

## 2021-06-18 NOTE — Plan of Care (Signed)
°  Problem: Health Behavior/Discharge Planning: Goal: Ability to manage health-related needs will improve Outcome: Adequate for Discharge   Problem: Clinical Measurements: Goal: Ability to maintain clinical measurements within normal limits will improve Outcome: Adequate for Discharge Goal: Will remain free from infection Outcome: Adequate for Discharge Goal: Diagnostic test results will improve Outcome: Adequate for Discharge Goal: Cardiovascular complication will be avoided Outcome: Adequate for Discharge   Problem: Education: Goal: Ability to demonstrate management of disease process will improve Outcome: Adequate for Discharge Goal: Ability to verbalize understanding of medication therapies will improve Outcome: Adequate for Discharge Goal: Individualized Educational Video(s) Outcome: Adequate for Discharge   Problem: Activity: Goal: Capacity to carry out activities will improve Outcome: Adequate for Discharge

## 2021-06-18 NOTE — Progress Notes (Signed)
Mobility Specialist Progress Note:   06/18/21 1003  Mobility  Activity Ambulated with assistance in hallway  Level of Assistance Independent after set-up  Assistive Device None  Distance Ambulated (ft) 410 ft  Activity Response Tolerated well  $Mobility charge 1 Mobility   Pt received in bed willing to participate in mobility. No complaints of pain and asymptomatic. Pt left in bed with call bell in reach and all needs met.   [facility] Public librarian Phone [phone] Secondary Phone [phone]

## 2021-06-18 NOTE — Progress Notes (Signed)
Progress Note  Patient Name: Kenneth Bailey Date of Encounter: 06/18/2021  [facility] Cardiologist: None   Subjective   Feels better, would like to go home.  No chest pain, no shortness of breath.  Wife at bedside.  Inpatient Medications    Scheduled Meds:  sodium chloride   Intravenous Once   aspirin EC  325 mg Oral Daily   Or   aspirin  324 mg Per Tube Daily   atorvastatin  80 mg Oral Q2000   bisacodyl  10 mg Oral Daily   Or   bisacodyl  10 mg Rectal Daily   Chlorhexidine Gluconate Cloth  6 each Topical Daily   dapagliflozin propanediol  10 mg Oral Daily   digoxin  0.125 mg Oral Daily   docusate sodium  200 mg Oral Daily   enoxaparin (LOVENOX) injection  40 mg Subcutaneous QHS   insulin aspart  0-24 Units Subcutaneous TID WC   insulin aspart  0-5 Units Subcutaneous QHS   insulin detemir  25 Units Subcutaneous BID   melatonin  5 mg Oral QHS   mometasone-formoterol  2 puff Inhalation BID   mupirocin cream   Topical BID   nicotine  14 mg Transdermal Daily   pantoprazole  40 mg Oral Daily   sacubitril-valsartan  1 tablet Oral BID   sodium chloride flush  10-40 mL Intracatheter Q12H   sodium chloride flush  3 mL Intravenous Q12H   spironolactone  12.5 mg Oral Daily   Continuous Infusions:  sodium chloride Stopped (06/11/21 0905)   sodium chloride 250 mL (06/11/21 1414)   sodium chloride     PRN Meds: sodium chloride, acetaminophen, dextrose, metoprolol tartrate, ondansetron (ZOFRAN) IV, prochlorperazine, sodium chloride flush, sodium chloride flush, traMADol   Vital Signs    Vitals:   06/18/21 0248 06/18/21 0537 06/18/21 0730 06/18/21 0824  BP: 127/74  113/80   Pulse: 95  94   Resp: 19  17   Temp: 98 F (36.7 C)  98 F (36.7 C)   TempSrc: Oral  Oral   SpO2: 97%  98% 96%  Weight:  70.9 kg    Height:        Intake/Output Summary (Last 24 hours) at 06/18/2021 1047 Last data filed at 06/18/2021 0600 Gross per 24 hour  Intake 900 ml  Output 500 ml   Net 400 ml   Last 3 Weights 06/18/2021 06/17/2021 06/16/2021  Weight (lbs) 156 lb 4.9 oz 159 lb 6.3 oz 164 lb 7.4 oz  Weight (kg) 70.9 kg 72.3 kg 74.6 kg      Telemetry    No adverse arrhythmias- Personally Reviewed   Physical Exam   GEN: No acute distress.  Laying comfortably in bed Neck: No JVD Cardiac: RRR, no murmurs, rubs, or gallops.  CABG scar noted well-healing. Respiratory: Clear to auscultation bilaterally. GI: Soft, nontender, non-distended  MS: No edema; No deformity. Neuro:  Nonfocal  Psych: Normal affect   Labs    High Sensitivity Troponin:  No results for input(s): TROPONINIHS in the last 720 hours.   Chemistry Recent Labs  Lab 06/11/21 1707 06/12/21 6222 06/13/21 0435 06/14/21 0353 06/15/21 0352 06/16/21 0207 06/17/21 0313 06/18/21 0610  NA 134*   < > 132* 133*   < > 136 133* 134*  K 4.9   < > 4.3 4.1   < > 3.8 4.3 4.5  CL 100   < > 100 98   < > 89* 94* 97*  CO2 25   < >  28 29   < > 34* 29 28  GLUCOSE 189*   < > 83 110*   < > 108* 107* 106*  BUN 29*   < > 30* 24*   < > 30* 34* 37*  CREATININE 1.47*   < > 1.21 1.04   < > 1.12 1.29* 1.08  CALCIUM 7.8*   < > 7.2* 7.3*   < > 9.3 9.1 9.3  MG 2.4  --  2.3 2.2  --   --   --   --   GFRNONAA 56*   < > >60 >60   < > >60 >60 >60  ANIONGAP 9   < > 4* 6   < > 13 10 9    < > = values in this interval not displayed.    Lipids No results for input(s): CHOL, TRIG, HDL, LABVLDL, LDLCALC, CHOLHDL in the last 168 hours.  Hematology Recent Labs  Lab 06/16/21 0207 06/17/21 0313 06/18/21 0610  WBC 5.4 7.2 9.0  RBC 3.87* 4.05* 4.32  HGB 11.0* 11.5* 12.1*  HCT 32.5* 34.3* 35.9*  MCV 84.0 84.7 83.1  MCH 28.4 28.4 28.0  MCHC 33.8 33.5 33.7  RDW 13.1 13.0 13.2  PLT 244 257 273   Thyroid No results for input(s): TSH, FREET4 in the last 168 hours.  BNPNo results for input(s): BNP, PROBNP in the last 168 hours.  DDimer No results for input(s): DDIMER in the last 168 hours.   Radiology    DG Chest 2  View  Result Date: 06/17/2021 CLINICAL DATA:  Postoperative state, chest pain EXAM: CHEST - 2 VIEW COMPARISON:  Previous studies including the examination of 06/15/2021 FINDINGS: Cardiac size is within normal limits. Central pulmonary vessels are less prominent. There is interval improvement in aeration of left lower lung fields with small residual linear densities. There is blunting of left lateral CP angle. There is no pneumothorax. There is evidence of previous coronary bypass surgery. Tip of PICC line is seen in the course of superior vena cava. IMPRESSION: There is interval decrease in pulmonary vascular congestion and improvement of aeration in the left lower lung fields. Residual linear densities in the left lower lung fields suggest residual subsegmental atelectasis. Possible small left pleural effusion. Electronically Signed   By: Elmer Picker M.D.   On: 06/17/2021 15:47    Patient Profile     56 y.o. male post CABG x4 with acute systolic heart failure  Assessment & Plan    Acute systolic heart failure - Goal-directed medical therapy on board.  Milrinone was stopped yesterday.  Co ox 55.  Tolerating his medications.  He has been up and about today.  He is asking/requesting to be able to go home.  I think this is reasonable at this point.  Reviewed Dr. Clayborne Dana prior note.  Discussed with cardiothoracic team. -Holding off of beta-blocker at this time given recent use of milrinone. -Continuing with digoxin, Farxiga, low-dose Entresto, low-dose spironolactone. -Close follow-up with advanced heart failure clinic  CAD - CABG x4 LIMA to LAD SVG to PL SVG to OM SVG to RI continue with aspirin 81 atorvastatin 80 mg high intensity.  AKI - Resolved, creatinine today 1.08 from 1.29 yesterday.    For questions or updates, please contact [place] Please consult [email or web address] for contact info under        Signed, Candee Furbish, MD  06/18/2021, 10:47 AM

## 2021-06-18 NOTE — Progress Notes (Signed)
Pt walked earlier without RW, sts he does not need for home. Girlfriend here and education completed with him and her. Discussed IS, sternal precautions, smoking cessation, daily wts, signs of fluid, diet, exercise, and CRPII. Receptive. Pt sts he is not smoking any more. Diet is harder for him. Will refer to [place] CES, ACSM 11:49 AM 06/18/2021

## 2021-06-18 NOTE — Progress Notes (Signed)
° °   °  [address]       [place] [address]             [phone]      8 Days Post-Op Procedure(s) (LRB): CORONARY ARTERY BYPASS GRAFTING (CABG) TIMES FOUR, USING LEFT INTERNAL MAMMARY ARTERY AND RIGHT GREATER SAPHENOUS VEIN HARVESTED ENDOSCOPICALLY (N/A) TRANSESOPHAGEAL ECHOCARDIOGRAM (TEE) (N/A) APPLICATION OF CELL SAVER (N/A) ENDOVEIN HARVEST OF GREATER SAPHENOUS VEIN (Right)  Subjective:  No complaints.  Patient wants to go home.   Objective: Vital signs in last 24 hours: Temp:  [97.6 F (36.4 C)-98.8 F (37.1 C)] 98 F (36.7 C) (01/28 0730) Pulse Rate:  [82-97] 94 (01/28 0730) Cardiac Rhythm: Normal sinus rhythm (01/28 0712) Resp:  [15-19] 17 (01/28 0730) BP: (95-127)/(66-80) 113/80 (01/28 0730) SpO2:  [94 %-98 %] 96 % (01/28 0824) Weight:  [70.9 kg] 70.9 kg (01/28 0537)  Intake/Output from previous day: 01/27 0701 - 01/28 0700 In: 1500 [P.O.:1500] Out: 1000 [Urine:1000]  General appearance: alert, cooperative, and no distress Heart: regular rate and rhythm Lungs: clear to auscultation bilaterally Abdomen: soft, non-tender; bowel sounds normal; no masses,  no organomegaly Extremities: edema trace Wound: clean and dry  Lab Results: Recent Labs    06/17/21 0313 06/18/21 0610  WBC 7.2 9.0  HGB 11.5* 12.1*  HCT 34.3* 35.9*  PLT 257 273   BMET:  Recent Labs    06/17/21 0313 06/18/21 0610  NA 133* 134*  K 4.3 4.5  CL 94* 97*  CO2 29 28  GLUCOSE 107* 106*  BUN 34* 37*  CREATININE 1.29* 1.08  CALCIUM 9.1 9.3    PT/INR: No results for input(s): LABPROT, INR in the last 72 hours. ABG    Component Value Date/Time   PHART 7.384 06/10/2021 1827   HCO3 23.9 06/10/2021 1827   TCO2 25 06/10/2021 1827   ACIDBASEDEF 1.0 06/10/2021 1827   O2SAT 55.1 06/18/2021 0610   CBG (last 3)  Recent Labs    06/17/21 1615 06/17/21 2106 06/18/21 0603  GLUCAP 165* 122* 106*    Assessment/Plan: S/P Procedure(s) (LRB): CORONARY ARTERY BYPASS  GRAFTING (CABG) TIMES FOUR, USING LEFT INTERNAL MAMMARY ARTERY AND RIGHT GREATER SAPHENOUS VEIN HARVESTED ENDOSCOPICALLY (N/A) TRANSESOPHAGEAL ECHOCARDIOGRAM (TEE) (N/A) APPLICATION OF CELL SAVER (N/A) ENDOVEIN HARVEST OF GREATER SAPHENOUS VEIN (Right)  CV- NSR, off milrinone Coox is 55 this AM-on Entresto, Digoxin, Aldactone, Farxiga per AHF Pulm- off oxygen, no acute issues Renal- creatinine stable at 1.08. appears euvolemic on exam, diuretics per AHF DM- sugars are controlled, continue current insulin regimen, patient will need close follow up at discharge Dispo- patient stable, doing well off Milrinone.. Coox is 55 this morning, medications per AHF.Marland Kitchen patient wishes to go home today, however AHF note yesterday stated possibly Sunday, will await there evaluation today   LOS: 12 days    Ellwood Handler, PA-C 06/18/2021

## 2021-06-20 ENCOUNTER — Telehealth (HOSPITAL_COMMUNITY): Payer: Self-pay

## 2021-06-20 NOTE — TOC CM/SW Note (Addendum)
06/20/2021 913 am HF TOC CM faxed DC summary to [facility]. States pt will be open today for [facility]   [place], Heart Failure TOC CM [phone]

## 2021-06-20 NOTE — Telephone Encounter (Signed)
Per phase I cardiac rehab, fax cardiac rehab to [facility].

## 2021-06-24 ENCOUNTER — Encounter (HOSPITAL_COMMUNITY): Payer: Self-pay

## 2021-06-24 ENCOUNTER — Other Ambulatory Visit: Payer: Self-pay

## 2021-06-24 ENCOUNTER — Ambulatory Visit (HOSPITAL_COMMUNITY)
Admit: 2021-06-24 | Discharge: 2021-06-24 | Disposition: A | Discharge status: Home / Self Care | Payer: BC Managed Care – PPO | Attending: Family Medicine | Admitting: Family Medicine

## 2021-06-24 VITALS — BP 110/70 | HR 92 | Wt 164.8 lb

## 2021-06-24 DIAGNOSIS — I110 Hypertensive heart disease with heart failure: Secondary | ICD-10-CM | POA: Insufficient documentation

## 2021-06-24 DIAGNOSIS — I5022 Chronic systolic (congestive) heart failure: Secondary | ICD-10-CM | POA: Diagnosis not present

## 2021-06-24 DIAGNOSIS — Z7984 Long term (current) use of oral hypoglycemic drugs: Secondary | ICD-10-CM | POA: Diagnosis not present

## 2021-06-24 DIAGNOSIS — I739 Peripheral vascular disease, unspecified: Secondary | ICD-10-CM

## 2021-06-24 DIAGNOSIS — E1151 Type 2 diabetes mellitus with diabetic peripheral angiopathy without gangrene: Secondary | ICD-10-CM | POA: Diagnosis not present

## 2021-06-24 DIAGNOSIS — Z7982 Long term (current) use of aspirin: Secondary | ICD-10-CM | POA: Diagnosis not present

## 2021-06-24 DIAGNOSIS — E785 Hyperlipidemia, unspecified: Secondary | ICD-10-CM | POA: Insufficient documentation

## 2021-06-24 DIAGNOSIS — I255 Ischemic cardiomyopathy: Secondary | ICD-10-CM | POA: Diagnosis not present

## 2021-06-24 DIAGNOSIS — R7989 Other specified abnormal findings of blood chemistry: Secondary | ICD-10-CM

## 2021-06-24 DIAGNOSIS — Z79899 Other long term (current) drug therapy: Secondary | ICD-10-CM | POA: Diagnosis not present

## 2021-06-24 DIAGNOSIS — N179 Acute kidney failure, unspecified: Secondary | ICD-10-CM | POA: Diagnosis not present

## 2021-06-24 DIAGNOSIS — E1165 Type 2 diabetes mellitus with hyperglycemia: Secondary | ICD-10-CM | POA: Insufficient documentation

## 2021-06-24 DIAGNOSIS — Z8719 Personal history of other diseases of the digestive system: Secondary | ICD-10-CM | POA: Diagnosis not present

## 2021-06-24 DIAGNOSIS — I2510 Atherosclerotic heart disease of native coronary artery without angina pectoris: Secondary | ICD-10-CM | POA: Diagnosis not present

## 2021-06-24 DIAGNOSIS — Z87891 Personal history of nicotine dependence: Secondary | ICD-10-CM

## 2021-06-24 DIAGNOSIS — I509 Heart failure, unspecified: Secondary | ICD-10-CM | POA: Diagnosis not present

## 2021-06-24 DIAGNOSIS — I5021 Acute systolic (congestive) heart failure: Secondary | ICD-10-CM

## 2021-06-24 DIAGNOSIS — E1159 Type 2 diabetes mellitus with other circulatory complications: Secondary | ICD-10-CM

## 2021-06-24 DIAGNOSIS — K869 Disease of pancreas, unspecified: Secondary | ICD-10-CM

## 2021-06-24 DIAGNOSIS — Z794 Long term (current) use of insulin: Secondary | ICD-10-CM

## 2021-06-24 DIAGNOSIS — Z951 Presence of aortocoronary bypass graft: Secondary | ICD-10-CM | POA: Insufficient documentation

## 2021-06-24 DIAGNOSIS — J449 Chronic obstructive pulmonary disease, unspecified: Secondary | ICD-10-CM | POA: Diagnosis not present

## 2021-06-24 DIAGNOSIS — F17210 Nicotine dependence, cigarettes, uncomplicated: Secondary | ICD-10-CM | POA: Insufficient documentation

## 2021-06-24 LAB — CBC
HCT: 32.7 % — ABNORMAL LOW (ref 39.0–52.0)
Hemoglobin: 10.9 g/dL — ABNORMAL LOW (ref 13.0–17.0)
MCH: 27.7 pg (ref 26.0–34.0)
MCHC: 33.3 g/dL (ref 30.0–36.0)
MCV: 83.2 fL (ref 80.0–100.0)
Platelets: 335 10*3/uL (ref 150–400)
RBC: 3.93 MIL/uL — ABNORMAL LOW (ref 4.22–5.81)
RDW: 13.1 % (ref 11.5–15.5)
WBC: 8.8 10*3/uL (ref 4.0–10.5)
nRBC: 0 % (ref 0.0–0.2)

## 2021-06-24 LAB — DIGOXIN LEVEL: Digoxin Level: 0.5 ng/mL — ABNORMAL LOW (ref 0.8–2.0)

## 2021-06-24 LAB — BASIC METABOLIC PANEL
Anion gap: 9 (ref 5–15)
BUN: 59 mg/dL — ABNORMAL HIGH (ref 6–20)
CO2: 22 mmol/L (ref 22–32)
Calcium: 9.6 mg/dL (ref 8.9–10.3)
Chloride: 103 mmol/L (ref 98–111)
Creatinine, Ser: 1.26 mg/dL — ABNORMAL HIGH (ref 0.61–1.24)
GFR, Estimated: >60 mL/min (ref >60)
Glucose, Bld: 149 mg/dL — ABNORMAL HIGH (ref 70–99)
Potassium: 5.1 mmol/L (ref 3.5–5.1)
Sodium: 134 mmol/L — ABNORMAL LOW (ref 135–145)

## 2021-06-24 MED ORDER — CARVEDILOL 3.125 MG PO TABS: 3.1250 mg | ORAL_TABLET | Freq: Two times a day (BID) | ORAL | 4 refills | Status: DC

## 2021-06-24 NOTE — Progress Notes (Signed)
ADVANCED HF CLINIC CONSULT NOTE   Primary Care: Marguerita Merles, MD HF Cardiologist: Dr. Haroldine Laws  HPI: Kenneth Bailey is a 55 y.o. 56 y/o male w/ IDDM, HTN, HLD, COPD, and new diagnosis of CAD and systolic HF/iCM.  Admitted 1/23 with acute CHF. Echo showed severely reduced LVEF 25-30%, mild LVH, severe HK of the LV w/ restrictive LV filling pattern c/w elevated LA pressure, RV mildly reduced, mild MR. AHF consulted. Underwent R/LHC showing severe 3v CAD, well-compensated filling pressures. He underwent CABG x 4 06/10/21 (LIMA to LAD, SVG to PL, SVG to OM, SVG to RI). Hospitalization c/b AKI and pancreatic lesion incidentally found on CT. GDMT titrated and he was discharged home, weight 156 lbs.  Today he returns for post hospital HF follow up with his girlfriend. Overall feeling fine. He is not SOB with walking on flat ground, but has not pushed himself much physically since discharge. Denies palpitations, abnormal bleeding, CP, dizziness, edema, or PND/Orthopnea. Appetite ok. No fever or chills. Weight at home 156-157 pounds. Taking all medications. Last cigarette was 06/04/21. Previously worked in a [employer] full time, moderately physical job.  Cardiac Studies: - Echo (1/23): EF 25-30%, mild LVH, severe HK of the LV w/ restrictive LV filling pattern c/w elevated LA pressure, RV mildly reduced, mild MR.   - R/LHC (1/23): severe 3v CAD Ao = 99/62 (84)  LV = 107/11 RA =  5 RV = 30/6 PA = 29/9 (19) PCW = 12 Fick cardiac output/index = 4.7/2.5 PVR = 1.5 WU FA sat = 97% PA sat = 69%, 71% SVC sat = 69%   Assessment: Severe 3v CAD  Ischemic CM EF ~25-30% Well compensated filling pressures   Plan/Discussion:  Will need CABG evaluation.   Review of Systems: [y] = yes, [ ]  = no   General: Weight gain [ ] ; Weight loss [ ] ; Anorexia [ ] ; Fatigue [ ] ; Fever [ ] ; Chills [ ] ; Weakness [ ]   Cardiac: Chest pain/pressure [ ] ; Resting SOB [ ] ; Exertional SOB [ ] ; Orthopnea [ ] ; Pedal  Edema [ ] ; Palpitations [ ] ; Syncope [ ] ; Presyncope [ ] ; Paroxysmal nocturnal dyspnea[ ]   Pulmonary: Cough [ ] ; Wheezing[ ] ; Hemoptysis[ ] ; Sputum [ ] ; Snoring [ ]   GI: Vomiting[ ] ; Dysphagia[ ] ; Melena[ ] ; Hematochezia [ ] ; Heartburn[ ] ; Abdominal pain [ ] ; Constipation [ ] ; Diarrhea [ ] ; BRBPR [ ]   GU: Hematuria[ ] ; Dysuria [ ] ; Nocturia[ ]   Vascular: Pain in legs with walking [y]; Pain in feet with lying flat [ ] ; Non-healing sores [ ] ; Stroke [ ] ; TIA [ ] ; Slurred speech [ ] ;  Neuro: Headaches[ ] ; Vertigo[ ] ; Seizures[ ] ; Paresthesias[ ] ;Blurred vision [ ] ; Diplopia [ ] ; Vision changes [ ]   Ortho/Skin: Arthritis [ ] ; Joint pain [ ] ; Muscle pain [ ] ; Joint swelling [ ] ; Back Pain [ ] ; Rash [ ]   Psych: Depression[ ] ; Anxiety[ ]   Heme: Bleeding problems [ ] ; Clotting disorders [ ] ; Anemia [ ]   Endocrine: Diabetes Blue.Reese ]; Thyroid dysfunction[ ]   Past Medical History:  Diagnosis Date   COPD (chronic obstructive pulmonary disease) (HCC)    Diabetes mellitus, type 2 (HCC)    HLD (hyperlipidemia)    Hypertension    Tobacco abuse    Current Outpatient Medications  Medication Sig Dispense Refill   acetaminophen (TYLENOL) 325 MG tablet Take 2 tablets (650 mg total) by mouth every 4 (four) hours as needed for moderate pain.  aspirin EC 325 MG EC tablet Take 1 tablet (325 mg total) by mouth daily. 30 tablet 0   Coenzyme Q10 (COQ-10 PO) Take 1 capsule by mouth daily.     dapagliflozin propanediol (FARXIGA) 10 MG TABS tablet Take 1 tablet (10 mg total) by mouth daily. 30 tablet 2   digoxin (LANOXIN) 0.125 MG tablet Take 1 tablet (0.125 mg total) by mouth daily. 30 tablet 2   Insulin Glargine (BASAGLAR KWIKPEN) 100 UNIT/ML Inject 12-50 Units into the skin See admin instructions. Per sliding scale     metFORMIN (GLUCOPHAGE) 1000 MG tablet Take 1,000 mg by mouth 2 (two) times daily.     nicotine (NICODERM CQ - DOSED IN MG/24 HOURS) 14 mg/24hr patch Place 1 patch (14 mg total) onto the skin daily. 14  patch 1   sacubitril-valsartan (ENTRESTO) 24-26 MG Take 1 tablet by mouth 2 (two) times daily. 60 tablet 2   spironolactone (ALDACTONE) 25 MG tablet Take 0.5 tablets (12.5 mg total) by mouth daily. 30 tablet 2   No current facility-administered medications for this encounter.   Allergies  Allergen Reactions   Lisinopril     Leg swelling   Statins     Leg swelling   Social History   Socioeconomic History   Marital status: Divorced    Spouse name: Not on file   Number of children: Not on file   Years of education: Not on file   Highest education level: Not on file  Occupational History   Not on file  Tobacco Use   Smoking status: Every Day    Types: Cigarettes   Smokeless tobacco: Current  Substance and Sexual Activity   Alcohol use: Not on file   Drug use: Not on file   Sexual activity: Not on file  Other Topics Concern   Not on file  Social History Narrative   Not on file   Social Determinants of Health   Financial Resource Strain: Not on file  Food Insecurity: Not on file  Transportation Needs: Not on file  Physical Activity: Not on file  Stress: Not on file  Social Connections: Not on file  Intimate Partner Violence: Not on file   Family History  Problem Relation Age of Onset   Hypertension Mother    Cancer Mother    Heart disease Father    BP 110/70    Pulse 92    Wt 74.8 kg (164 lb 12.8 oz)    SpO2 99%    BMI 25.06 kg/m   Wt Readings from Last 3 Encounters:  06/24/21 74.8 kg (164 lb 12.8 oz)  06/18/21 70.9 kg (156 lb 4.9 oz)   PHYSICAL EXAM: General:  NAD. No resp difficulty, appears older than stated age 49: Normal Neck: Supple. No JVD. Carotids 2+ bilat; no bruits. No lymphadenopathy or thryomegaly appreciated. Cor: PMI nondisplaced. Regular rate & rhythm. No rubs, gallops or murmurs. Lungs: Clear, surgical scar approximated and well-healed. Abdomen: Soft, nontender, nondistended. No hepatosplenomegaly. No bruits or masses. Good bowel  sounds. Extremities: No cyanosis, clubbing, rash, edema Neuro: Alert & oriented x 3, cranial nerves grossly intact. Moves all 4 extremities w/o difficulty. Affect pleasant.  ECG: NSR 92 bpm (personally reviewed).  ASSESSMENT & PLAN: 1. Chronic Systolic Heart Failure (New)/ Ischemic CM  - Echo at [facility]: LVEF 25-30%, global HK. RV mildly reduced  - Echo [facility] (1/23): EF 25-30%, RV normal  - LHC (1/23): w/ severe 3V CAD - s/p CABG x 4 on 1/20. - NYHA  II, volume looks good today. - Start carvedilol 3.125 mg bid. - Continue Farxiga 10 mg daily.  - Continue digoxin 0.125 mg daily. - Continue spironolactone 12.5 mg daily  - Continue Entresto 24/26 mg bid. - Repeat echo in 3 months after GDMT optimized. - BMET and dig level today.   2. CAD - LHC w/ severe 3V CAD s/p CABG x 4 1/20 (LIMA to LAD, SVG to PL, SVG to OM, SVG to RI) - No CP. - He tells me he cannot tolerate statins (he has tried atorva + CoQ10 in past, does not recall dose of statin). Refer to Lipid Clinic. - Continue ASA.  - Start beta blocker as above. - CR when cleared by surgery.   3. H/o AKI - Labs today. - On SGLT2i.   4. Pancreatic Lesion - Incidental finding of a 2.2 x 0.8 cm hypervascular structure in the body of the pancreas noted on CT. - MRI shows benign thrombosed vessel (had remote h/o severe pancreatitis).   4. Type 2DM, poorly controlled - Hgb A1c 10.7. On insulin. - Continue SGLT2i.   5. Tobacco Abuse - Quit x 3 weeks. - Congratulated.  6. PAD - R + L moderate disease by ABI 1/23. - Continues with claudication-type pain, able to walk through it.   Follow up with APP in 4-6 weeks and in 12 weeks with Dr. Haroldine Laws + echo.  Allena Katz, FNP-BC 06/24/21

## 2021-06-24 NOTE — Patient Instructions (Addendum)
Medication Changes:  START Carvedilol 3.125 mg Twice daily   Lab Work:  Labs done today, your results will be available in MyChart, we will contact you for abnormal readings.   Testing/Procedures:  Your physician has requested that you have an echocardiogram. Echocardiography is a painless test that uses sound waves to create images of your heart. It provides your doctor with information about the size and shape of your heart and how well your hearts chambers and valves are working. This procedure takes approximately one hour. There are no restrictions for this procedure.    Referrals Lipid Clinic They will call you to schedule an appointment    Special Instructions // Education: none    Follow-Up in: 4 weeks w app clinic    12 weeks with Dr. Haroldine Laws  At the [facility], you and your health needs are our priority. We have a designated team specialized in the treatment of Heart Failure. This Care Team includes your primary Heart Failure Specialized Cardiologist (physician), Advanced Practice Providers (APPs- Physician Assistants and Nurse Practitioners), and Pharmacist who all work together to provide you with the care you need, when you need it.   You may see any of the following providers on your designated Care Team at your next follow up:  Dr Glori Bickers Dr Haynes Kerns, NP Lyda Jester, [place] [facility] [place], [place] Audry Riles, PharmD   Please be sure to bring in all your medications bottles to every appointment.   Need to Contact us:  If you have any questions or concerns before your next appointment please send [place] a message through [place] or call our office at [phone].    TO LEAVE A MESSAGE FOR THE NURSE SELECT OPTION 2, PLEASE LEAVE A MESSAGE INCLUDING: YOUR NAME DATE OF BIRTH CALL BACK NUMBER REASON FOR CALL**this is important as we prioritize the call backs  YOU WILL RECEIVE A CALL BACK THE SAME DAY  AS LONG AS YOU CALL BEFORE 4:00 PM  Do the following things EVERYDAY: Weigh yourself in the morning before breakfast. Write it down and keep it in a log. Take your medicines as prescribed Eat low salt foods--Limit salt (sodium) to 2000 mg per day.  Stay as active as you can everyday Limit all fluids for the day to less than 2 liters

## 2021-06-27 ENCOUNTER — Telehealth (HOSPITAL_COMMUNITY): Payer: Self-pay | Admitting: Surgery

## 2021-06-27 NOTE — Telephone Encounter (Signed)
-----   Message from Rafael Bihari, [place] sent at 06/24/2021  8:08 PM EST ----- BUN/SCr mildly elevated. K high end of normal. Repeat BMET in 10-14 days.

## 2021-06-27 NOTE — Telephone Encounter (Signed)
I attempted to reach patient regarding results and recommendations.  I left a message for a call back.

## 2021-06-29 ENCOUNTER — Other Ambulatory Visit (HOSPITAL_COMMUNITY): Payer: Self-pay | Admitting: Cardiology

## 2021-06-29 DIAGNOSIS — I5022 Chronic systolic (congestive) heart failure: Secondary | ICD-10-CM

## 2021-07-08 ENCOUNTER — Other Ambulatory Visit: Payer: Self-pay | Admitting: Cardiothoracic Surgery

## 2021-07-08 DIAGNOSIS — Z951 Presence of aortocoronary bypass graft: Secondary | ICD-10-CM

## 2021-07-11 ENCOUNTER — Ambulatory Visit (HOSPITAL_COMMUNITY)
Admission: RE | Admit: 2021-07-11 | Discharge: 2021-07-11 | Disposition: A | Discharge status: Home / Self Care | Payer: BC Managed Care – PPO | Source: Ambulatory Visit | Attending: Cardiology | Admitting: Cardiology

## 2021-07-11 ENCOUNTER — Encounter: Payer: Self-pay | Admitting: Cardiothoracic Surgery

## 2021-07-11 ENCOUNTER — Ambulatory Visit (INDEPENDENT_AMBULATORY_CARE_PROVIDER_SITE_OTHER): Payer: Self-pay | Admitting: Cardiothoracic Surgery

## 2021-07-11 ENCOUNTER — Other Ambulatory Visit: Payer: Self-pay

## 2021-07-11 ENCOUNTER — Ambulatory Visit
Admission: RE | Admit: 2021-07-11 | Discharge: 2021-07-11 | Disposition: A | Discharge status: Home / Self Care | Payer: BC Managed Care – PPO | Source: Ambulatory Visit | Attending: Cardiothoracic Surgery | Admitting: Cardiothoracic Surgery

## 2021-07-11 VITALS — BP 112/77 | HR 102 | Resp 20 | Ht 68.0 in | Wt 161.0 lb

## 2021-07-11 DIAGNOSIS — Z951 Presence of aortocoronary bypass graft: Secondary | ICD-10-CM | POA: Insufficient documentation

## 2021-07-11 DIAGNOSIS — I5022 Chronic systolic (congestive) heart failure: Secondary | ICD-10-CM | POA: Diagnosis not present

## 2021-07-11 LAB — BASIC METABOLIC PANEL
Anion gap: 11 (ref 5–15)
BUN: 26 mg/dL — ABNORMAL HIGH (ref 6–20)
CO2: 22 mmol/L (ref 22–32)
Calcium: 9.2 mg/dL (ref 8.9–10.3)
Chloride: 103 mmol/L (ref 98–111)
Creatinine, Ser: 0.97 mg/dL (ref 0.61–1.24)
GFR, Estimated: >60 mL/min (ref >60)
Glucose, Bld: 163 mg/dL — ABNORMAL HIGH (ref 70–99)
Potassium: 5.9 mmol/L — ABNORMAL HIGH (ref 3.5–5.1)
Sodium: 136 mmol/L (ref 135–145)

## 2021-07-11 NOTE — Progress Notes (Addendum)
HPI: Patient returns for routine postoperative follow-up having undergone multivessel coronary bypass grafting on June 10, 2021, for ischemic cardiomyopathy and severe coronary artery disease.. The patient's early postoperative recovery while in the hospital was notable for progressive improvement in his cardiac status, maintaining sinus rhythm and improved cardiac function.. Since hospital discharge the patient reports further improvement in exercise tolerance, no symptoms of angina or CHF.  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   Current Outpatient Medications  Medication Sig Dispense Refill   acetaminophen (TYLENOL) 325 MG tablet Take 2 tablets (650 mg total) by mouth every 4 (four) hours as needed for moderate pain.     aspirin EC 325 MG EC tablet Take 1 tablet (325 mg total) by mouth daily. 30 tablet 0   carvedilol (COREG) 3.125 MG tablet Take 1 tablet (3.125 mg total) by mouth 2 (two) times daily with a meal. 60 tablet 4   Coenzyme Q10 (COQ-10 PO) Take 1 capsule by mouth daily.     dapagliflozin propanediol (FARXIGA) 10 MG TABS tablet Take 1 tablet (10 mg total) by mouth daily. 30 tablet 2   digoxin (LANOXIN) 0.125 MG tablet Take 1 tablet (0.125 mg total) by mouth daily. 30 tablet 2   gemfibrozil (LOPID) 600 MG tablet Take 600 mg by mouth 2 (two) times daily before a meal.     Insulin Glargine (BASAGLAR KWIKPEN) 100 UNIT/ML Inject 12-50 Units into the skin See admin instructions. Per sliding scale     metFORMIN (GLUCOPHAGE) 1000 MG tablet Take 1,000 mg by mouth 2 (two) times daily.     nicotine (NICODERM CQ - DOSED IN MG/24 HOURS) 14 mg/24hr patch Place 1 patch (14 mg total) onto the skin daily. 14 patch 1   sacubitril-valsartan  (ENTRESTO) 24-26 MG Take 1 tablet by mouth 2 (two) times daily. 60 tablet 2   spironolactone (ALDACTONE) 25 MG tablet Take 0.5 tablets (12.5 mg total) by mouth daily. 30 tablet 2   No current facility-administered medications for this visit.    Physical Exam:  Blood pressure 112/77, pulse (!) 102, resp. rate 20, height 5\' 8"  (1.727 m), weight 161 lb (73 kg), SpO2 96 %.       Exam    General- alert and comfortable.  Sternal incision well-healed.    Neck- no JVD, no cervical adenopathy palpable, no carotid bruit   Lungs- clear without rales, wheezes   Cor- regular rate and rhythm, no murmur , gallop   Abdomen- soft, non-tender   Extremities - warm, non-tender, minimal edema   Neuro- oriented, appropriate, no focal weakness   Diagnostic Tests:  Chest x-ray taken today personally reviewed showing clear lung fields stable cardiac silhouette, no pleural effusion  Impression:  Patient doing very well after multivessel CABG for ischemic cardiomyopathy. He will continue his current medications as directed by the cardiology service, resume driving, and lift up to 10 pounds limit until his follow-up visit in 8 weeks.  He was given a return to work form due to his employer that he should not lift more than 10-15 pounds.  Plan: Continue heart healthy diet and lifestyle as we discussed and continue current medications.  Return for follow-up.  Check in 8 weeks.   Dahlia Byes, MD [facility] [phone]

## 2021-07-12 ENCOUNTER — Telehealth (HOSPITAL_COMMUNITY): Payer: Self-pay

## 2021-07-12 DIAGNOSIS — I5022 Chronic systolic (congestive) heart failure: Secondary | ICD-10-CM

## 2021-07-12 MED ORDER — LOKELMA 10 G PO PACK: 10.0000 g | PACK | Freq: Two times a day (BID) | ORAL | 0 refills | Status: DC

## 2021-07-12 NOTE — Telephone Encounter (Signed)
[facility] called to report they are unable to fill [facility] today and will need to order   RX sent to [facility], pt aware

## 2021-07-12 NOTE — Telephone Encounter (Signed)
Called patient about high potasssium as per [facility]. Pt made aware to stop Entresto and Spironolactone.Take Lokelma 10 g packet x 2 doses 4-6 hours apart.Medication sent to [facility] pharmacy at patient's request Lab work scheduled for Friday the 40 th @8 :6.

## 2021-07-12 NOTE — Addendum Note (Signed)
Addended by: Kerry Dory on: 07/12/2021 11:03 AM   Modules accepted: Orders

## 2021-07-15 ENCOUNTER — Other Ambulatory Visit: Payer: Self-pay

## 2021-07-15 ENCOUNTER — Ambulatory Visit (HOSPITAL_COMMUNITY)
Admission: RE | Admit: 2021-07-15 | Discharge: 2021-07-15 | Disposition: A | Discharge status: Home / Self Care | Payer: BC Managed Care – PPO | Source: Ambulatory Visit | Attending: Cardiology | Admitting: Cardiology

## 2021-07-15 DIAGNOSIS — I5022 Chronic systolic (congestive) heart failure: Secondary | ICD-10-CM | POA: Insufficient documentation

## 2021-07-15 LAB — BASIC METABOLIC PANEL
Anion gap: 10 (ref 5–15)
BUN: 13 mg/dL (ref 6–20)
CO2: 23 mmol/L (ref 22–32)
Calcium: 9.6 mg/dL (ref 8.9–10.3)
Chloride: 106 mmol/L (ref 98–111)
Creatinine, Ser: 0.94 mg/dL (ref 0.61–1.24)
GFR, Estimated: >60 mL/min (ref >60)
Glucose, Bld: 191 mg/dL — ABNORMAL HIGH (ref 70–99)
Potassium: 4.1 mmol/L (ref 3.5–5.1)
Sodium: 139 mmol/L (ref 135–145)

## 2021-07-22 ENCOUNTER — Encounter (HOSPITAL_COMMUNITY): Payer: Self-pay

## 2021-07-22 ENCOUNTER — Other Ambulatory Visit: Payer: Self-pay

## 2021-07-22 ENCOUNTER — Ambulatory Visit (HOSPITAL_COMMUNITY)
Admission: RE | Admit: 2021-07-22 | Discharge: 2021-07-22 | Disposition: A | Discharge status: Home / Self Care | Payer: BC Managed Care – PPO | Source: Ambulatory Visit | Attending: Family Medicine | Admitting: Family Medicine

## 2021-07-22 VITALS — BP 102/64 | HR 98 | Wt 167.6 lb

## 2021-07-22 DIAGNOSIS — E875 Hyperkalemia: Secondary | ICD-10-CM

## 2021-07-22 DIAGNOSIS — I739 Peripheral vascular disease, unspecified: Secondary | ICD-10-CM

## 2021-07-22 DIAGNOSIS — Z7901 Long term (current) use of anticoagulants: Secondary | ICD-10-CM | POA: Insufficient documentation

## 2021-07-22 DIAGNOSIS — J449 Chronic obstructive pulmonary disease, unspecified: Secondary | ICD-10-CM | POA: Insufficient documentation

## 2021-07-22 DIAGNOSIS — I2510 Atherosclerotic heart disease of native coronary artery without angina pectoris: Secondary | ICD-10-CM | POA: Diagnosis not present

## 2021-07-22 DIAGNOSIS — K869 Disease of pancreas, unspecified: Secondary | ICD-10-CM

## 2021-07-22 DIAGNOSIS — Z7984 Long term (current) use of oral hypoglycemic drugs: Secondary | ICD-10-CM | POA: Insufficient documentation

## 2021-07-22 DIAGNOSIS — F17210 Nicotine dependence, cigarettes, uncomplicated: Secondary | ICD-10-CM | POA: Insufficient documentation

## 2021-07-22 DIAGNOSIS — I110 Hypertensive heart disease with heart failure: Secondary | ICD-10-CM | POA: Diagnosis not present

## 2021-07-22 DIAGNOSIS — Z794 Long term (current) use of insulin: Secondary | ICD-10-CM | POA: Diagnosis not present

## 2021-07-22 DIAGNOSIS — Z951 Presence of aortocoronary bypass graft: Secondary | ICD-10-CM | POA: Insufficient documentation

## 2021-07-22 DIAGNOSIS — E1165 Type 2 diabetes mellitus with hyperglycemia: Secondary | ICD-10-CM | POA: Insufficient documentation

## 2021-07-22 DIAGNOSIS — E1151 Type 2 diabetes mellitus with diabetic peripheral angiopathy without gangrene: Secondary | ICD-10-CM | POA: Diagnosis not present

## 2021-07-22 DIAGNOSIS — Z79899 Other long term (current) drug therapy: Secondary | ICD-10-CM | POA: Diagnosis not present

## 2021-07-22 DIAGNOSIS — I255 Ischemic cardiomyopathy: Secondary | ICD-10-CM | POA: Insufficient documentation

## 2021-07-22 DIAGNOSIS — E785 Hyperlipidemia, unspecified: Secondary | ICD-10-CM | POA: Diagnosis not present

## 2021-07-22 DIAGNOSIS — Z720 Tobacco use: Secondary | ICD-10-CM

## 2021-07-22 DIAGNOSIS — I5022 Chronic systolic (congestive) heart failure: Secondary | ICD-10-CM

## 2021-07-22 DIAGNOSIS — E1159 Type 2 diabetes mellitus with other circulatory complications: Secondary | ICD-10-CM | POA: Diagnosis not present

## 2021-07-22 LAB — BASIC METABOLIC PANEL
Anion gap: 8 (ref 5–15)
BUN: 26 mg/dL — ABNORMAL HIGH (ref 6–20)
CO2: 25 mmol/L (ref 22–32)
Calcium: 8.8 mg/dL — ABNORMAL LOW (ref 8.9–10.3)
Chloride: 102 mmol/L (ref 98–111)
Creatinine, Ser: 0.95 mg/dL (ref 0.61–1.24)
GFR, Estimated: >60 mL/min (ref >60)
Glucose, Bld: 100 mg/dL — ABNORMAL HIGH (ref 70–99)
Potassium: 5.2 mmol/L — ABNORMAL HIGH (ref 3.5–5.1)
Sodium: 135 mmol/L (ref 135–145)

## 2021-07-22 MED ORDER — SPIRONOLACTONE 25 MG PO TABS: 12.5000 mg | ORAL_TABLET | Freq: Every day | ORAL | 3 refills | Status: DC

## 2021-07-22 NOTE — Progress Notes (Signed)
? ?ADVANCED HF CLINIC NOTE ? ? ?Primary Care: Marguerita Merles, MD ?HF Cardiologist: Dr. Haroldine Laws ? ?HPI: ?Kenneth Bailey is a 55 y.o. 56 y/o male w/ IDDM, HTN, HLD, COPD, and new diagnosis of CAD and systolic HF/iCM. ? ?Admitted 1/23 with acute CHF. Echo showed severely reduced LVEF 25-30%, mild LVH, severe HK of the LV w/ restrictive LV filling pattern c/w elevated LA pressure, RV mildly reduced, mild MR. AHF consulted. Underwent R/LHC showing severe 3v CAD, well-compensated filling pressures. He underwent CABG x 4 06/10/21 (LIMA to LAD, SVG to PL, SVG to OM, SVG to RI). Hospitalization c/b AKI and pancreatic lesion incidentally found on CT. GDMT titrated and he was discharged home, weight 156 lbs. ? ?Post hospital follow up 2/23, doing well. Beta blocker started.  Repeat K elevated 5.9, I stopped Entresto and spiro and instructed him to take Lokelma x 2 doses. Repeat K 4.1. ? ?Today he returns for HF follow up. Overall feeling fine. 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 He is not SOB with walking or work duties. Denies CP, abnormal bleeding, palpitations, dizziness, edema, or PND/Orthopnea. Appetite ok. No fever or chills. Weight at home 160 pounds. Taking all medications. Back to smoking 2 cigs/day. He remembered he was taking extra spiro tablets when his K was elevated. ? ?Cardiac Studies: ?- Echo (1/23): EF 25-30%, mild LVH, severe HK of the LV w/ restrictive LV filling pattern c/w elevated LA pressure, RV mildly reduced, mild MR.  ? ?- R/LHC (1/23): severe 3v CAD ?Ao = 99/62 (84)  ?LV = 107/11 ?RA =  5 ?RV = 30/6 ?PA = 29/9 (19) ?PCW = 12 ?Fick cardiac output/index = 4.7/2.5 ?PVR = 1.5 WU ?FA sat = 97% ?PA sat = 69%, 71% ?SVC sat = 69% ?  ?Assessment: ?Severe 3v CAD  ?Ischemic CM EF ~25-30% ?Well compensated filling pressures ?  ?Plan/Discussion:  ?Will need CABG evaluation.  ? ?Past Medical History:  ?Diagnosis Date  ? COPD (chronic obstructive pulmonary disease) ([place])    ? Diabetes mellitus, type 2 ([place])   ? HLD (hyperlipidemia)   ? Hypertension   ? Tobacco abuse   ? ?Current Outpatient Medications  ?Medication Sig Dispense Refill  ? acetaminophen (TYLENOL) 325 MG tablet Take 2 tablets (650 mg total) by mouth every 4 (four) hours as needed for moderate pain.    ? aspirin EC 325 MG EC tablet Take 1 tablet (325 mg total) by mouth daily. 30 tablet 0  ? carvedilol (COREG) 3.125 MG tablet Take 1 tablet (3.125 mg total) by mouth 2 (two) times daily with a meal. 60 tablet 4  ? Coenzyme Q10 (COQ-10 PO) Take 1 capsule by mouth daily.    ? dapagliflozin propanediol (FARXIGA) 10 MG TABS tablet Take 1 tablet (10 mg total) by mouth daily. 30 tablet 2  ? digoxin (LANOXIN) 0.125 MG tablet Take 1 tablet (0.125 mg total) by mouth daily. 30 tablet 2  ? gemfibrozil (LOPID) 600 MG tablet Take 600 mg by mouth 2 (two) times daily before a meal.    ? Insulin Glargine (BASAGLAR KWIKPEN) 100 UNIT/ML Inject 12-50 Units into the skin See admin instructions. Per sliding scale morning and night    ? metFORMIN (GLUCOPHAGE) 1000 MG tablet Take 1,000 mg by mouth 2 (two) times daily.    ? nicotine (NICODERM CQ - DOSED IN MG/24 HOURS) 14 mg/24hr patch Place 1 patch (14 mg total) onto the skin daily.  14 patch 1  ? ?No current facility-administered medications for this encounter.  ? ?Allergies  ?Allergen Reactions  ? Lisinopril   ?  Leg swelling  ? Statins   ?  Leg swelling  ? ?Social History  ? ?Socioeconomic History  ? Marital status: Divorced  ?  Spouse name: Not on file  ? Number of children: Not on file  ? Years of education: Not on file  ? Highest education level: Not on file  ?Occupational History  ? Not on file  ?Tobacco Use  ? Smoking status: Every Day  ?  Types: Cigarettes  ? Smokeless tobacco: Current  ?Substance and Sexual Activity  ? Alcohol use: Not on file  ? Drug use: Not on file  ? Sexual activity: Not on file  ?Other Topics Concern  ? Not on file  ?Social History Narrative  ? Not on file  ? ?Social  Determinants of Health  ? ?Financial Resource Strain: Not on file  ?Food Insecurity: Not on file  ?Transportation Needs: Not on file  ?Physical Activity: Not on file  ?Stress: Not on file  ?Social Connections: Not on file  ?Intimate Partner Violence: Not on file  ? ?Family History  ?Problem Relation Age of Onset  ? Hypertension Mother   ? Cancer Mother   ? Heart disease Father   ? ?BP 102/64   Pulse 98   Wt 76 kg (167 lb 9.6 oz)   SpO2 97%   BMI 25.48 kg/m?  ? ?Wt Readings from Last 3 Encounters:  ?07/22/21 76 kg (167 lb 9.6 oz)  ?07/11/21 73 kg (161 lb)  ?06/24/21 74.8 kg (164 lb 12.8 oz)  ? ?PHYSICAL EXAM: ?General:  NAD. No resp difficulty, appears older than stated age. ?HEENT: Normal ?Neck: Supple. No JVD. Carotids 2+ bilat; no bruits. No lymphadenopathy or thryomegaly appreciated. ?Cor: PMI nondisplaced. Regular rate & rhythm. No rubs, gallops or murmurs. ?Lungs: Clear ?Abdomen: Soft, nontender, nondistended. No hepatosplenomegaly. No bruits or masses. Good bowel sounds. ?Extremities: No cyanosis, clubbing, rash, edema ?Neuro: Alert & oriented x 3, cranial nerves grossly intact. Moves all 4 extremities w/o difficulty. Affect pleasant. ? ?ASSESSMENT & PLAN: ?1. Chronic Systolic Heart Failure (New)/ Ischemic CM  ?- Echo at [facility]: LVEF 25-30%, global HK. RV mildly reduced  ?- Echo [place] (1/23): EF 25-30%, RV normal  ?- LHC (1/23): w/ severe 3V CAD ?- s/p CABG x 4 on 06/10/21. ?- NYHA II, volume looks good today. ?- Restart spiro 12.5 mg daily. Instructed to only take once daily. If K elevated, will keep off. ?- Continue carvedilol 3.125 mg bid. ?- Continue Farxiga 10 mg daily.  ?- Continue digoxin 0.125 mg daily. ?- Off Entresto with hyperK. ?- Repeat echo in 3 months after GDMT optimized. ?- BMET and dig level today; repeat BMET in 1 week (Rx given to have labs drawn closer to home). ?  ?2. CAD ?- LHC w/ severe 3V CAD s/p CABG x 4 1/20 (LIMA to LAD, SVG to PL, SVG to OM, SVG to RI) ?- No CP. ?- He tells me he  cannot tolerate statins (he has tried atorva + CoQ10 in past, does not recall dose of statin). Refer to Lipid Clinic. ?- Continue ASA + beta blocker.  ?- He refused CR. ?  ?3. Pancreatic Lesion ?- Incidental finding of a 2.2 x 0.8 cm hypervascular structure in the body of the pancreas noted on CT. ?- MRI shows benign thrombosed vessel (had remote h/o severe pancreatitis). ?  ?4.  Type 2DM, poorly controlled ?- Hgb A1c 10.7. On insulin. ?- Continue SGLT2i. ?  ?5. Tobacco Abuse ?- Quit for a bit but has re-started. ?- Advised cessation. ? ?6. PAD ?- R + L moderate disease by ABI 1/23. ?- Continues with claudication-type pain, able to walk through it. ? ?7. H/o hyperkalemia ?- Last K 4.1 ?- Re-challenge spiro. ?- Labs today, repeat in 1 week ?  ?Follow up with APP in 4-6 weeks(add Entresto back or low-dose losartan if K ok) and keep scheduled follow up with Dr. Haroldine Laws + echo. ? ?Allena Katz, FNP-BC ?07/22/21 ? ?

## 2021-07-22 NOTE — Patient Instructions (Addendum)
Labs done today. We will contact you only if your labs are abnormal. ? ?RESTART Spironolactone 12.5mg  (1/2 tablet) by mouth daily.  ? ?No other medication changes were made. Please continue all current medications as prescribed. ? ?Your physician recommends that you schedule a follow-up appointment in: 4-6 weeks with our NP/PA Clinic here in our office. Keep your pending appointment with Dr. Haroldine Laws.  ? ?If you have any questions or concerns before your next appointment please send [place] a message through [place] or call our office at [phone].   ? ?TO LEAVE A MESSAGE FOR THE NURSE SELECT OPTION 2, PLEASE LEAVE A MESSAGE INCLUDING: ?YOUR NAME ?DATE OF BIRTH ?CALL BACK NUMBER ?REASON FOR CALL**this is important as we prioritize the call backs ? ?YOU WILL RECEIVE A CALL BACK THE SAME DAY AS LONG AS YOU CALL BEFORE 4:00 PM ? ? ?Do the following things EVERYDAY: ?Weigh yourself in the morning before breakfast. Write it down and keep it in a log. ?Take your medicines as prescribed ?Eat low salt foods--Limit salt (sodium) to 2000 mg per day.  ?Stay as active as you can everyday ?Limit all fluids for the day to less than 2 liters ? ? ?At the [facility], you and your health needs are our priority. As part of our continuing mission to provide you with exceptional heart care, we have created designated Provider Care Teams. These Care Teams include your primary Cardiologist (physician) and Advanced Practice Providers (APPs- Physician Assistants and Nurse Practitioners) who all work together to provide you with the care you need, when you need it.  ? ?You may see any of the following providers on your designated Care Team at your next follow up: ?Dr Glori Bickers ?Dr Loralie Champagne ?Darrick Grinder, NP ?Lyda Jester, PA ?Audry Riles, PharmD ? ? ?Please be sure to bring in all your medications bottles to every appointment.  ? ?

## 2021-07-25 ENCOUNTER — Telehealth (HOSPITAL_COMMUNITY): Payer: Self-pay | Admitting: Surgery

## 2021-07-25 ENCOUNTER — Other Ambulatory Visit (HOSPITAL_COMMUNITY): Payer: Self-pay | Admitting: Surgery

## 2021-07-25 DIAGNOSIS — I5022 Chronic systolic (congestive) heart failure: Secondary | ICD-10-CM

## 2021-07-25 NOTE — Progress Notes (Signed)
Lab orders entered

## 2021-07-25 NOTE — Telephone Encounter (Signed)
-----   Message from Rafael Bihari, [place] sent at 07/25/2021  8:01 AM EST ----- ?K is elevated. Please stop spironolactone. Will not re-challenge. Please potassium-rich foods.  ? ?Repeat BMET in 1 week ?

## 2021-07-25 NOTE — Telephone Encounter (Signed)
I called patient and reviewed results per Allena Katz NP.  He is aware and will return for labwork on Thursday of this week.   ?

## 2021-07-28 ENCOUNTER — Other Ambulatory Visit: Payer: Self-pay

## 2021-07-28 ENCOUNTER — Ambulatory Visit (HOSPITAL_COMMUNITY)
Admission: RE | Admit: 2021-07-28 | Discharge: 2021-07-28 | Disposition: A | Discharge status: Home / Self Care | Payer: BC Managed Care – PPO | Source: Ambulatory Visit | Attending: Internal Medicine | Admitting: Internal Medicine

## 2021-07-28 DIAGNOSIS — I5022 Chronic systolic (congestive) heart failure: Secondary | ICD-10-CM | POA: Insufficient documentation

## 2021-07-28 LAB — BASIC METABOLIC PANEL
Anion gap: 11 (ref 5–15)
BUN: 36 mg/dL — ABNORMAL HIGH (ref 6–20)
CO2: 21 mmol/L — ABNORMAL LOW (ref 22–32)
Calcium: 9.7 mg/dL (ref 8.9–10.3)
Chloride: 104 mmol/L (ref 98–111)
Creatinine, Ser: 1.01 mg/dL (ref 0.61–1.24)
GFR, Estimated: >60 mL/min (ref >60)
Glucose, Bld: 179 mg/dL — ABNORMAL HIGH (ref 70–99)
Potassium: 5.1 mmol/L (ref 3.5–5.1)
Sodium: 136 mmol/L (ref 135–145)

## 2021-07-29 ENCOUNTER — Telehealth (HOSPITAL_COMMUNITY): Payer: Self-pay | Admitting: Surgery

## 2021-07-29 NOTE — Telephone Encounter (Signed)
-----   Message from Rafael Bihari, [place] sent at 07/29/2021  7:46 AM EST ----- ?K on the high end of normal. Will remain off spiro. No other changes ?

## 2021-07-29 NOTE — Telephone Encounter (Signed)
Patient called back and I reviewed results per Dr. Aundra Dubin.  I have updated medication list.  ?

## 2021-08-10 ENCOUNTER — Other Ambulatory Visit: Payer: Self-pay | Admitting: Physician Assistant

## 2021-09-05 ENCOUNTER — Ambulatory Visit: Payer: Self-pay | Admitting: Cardiothoracic Surgery

## 2021-09-06 NOTE — Progress Notes (Signed)
? ?ADVANCED HF CLINIC NOTE ? ? ?Primary Care: Marguerita Merles, MD ?HF Cardiologist: Dr. Haroldine Laws ? ?HPI: ?Kenneth Bailey is a 56 y.o. 56 y/o male w/ IDDM, HTN, HLD, COPD, and new diagnosis of CAD and systolic HF/iCM. ? ?Admitted 1/23 with acute CHF. Echo showed severely reduced LVEF 25-30%, mild LVH, severe HK of the LV w/ restrictive LV filling pattern c/w elevated LA pressure, RV mildly reduced, mild MR. AHF consulted. Underwent R/LHC showing severe 3v CAD, well-compensated filling pressures. He underwent CABG x 4 06/10/21 (LIMA to LAD, SVG to PL, SVG to OM, SVG to RI). Hospitalization c/b AKI and pancreatic lesion incidentally found on CT. GDMT titrated and he was discharged home, weight 156 lbs. ? ?Post hospital follow up 2/23, doing well. Beta blocker started.  Repeat K elevated 5.9, I stopped Entresto and spiro and instructed him to take Lokelma x 2 doses. Repeat K 4.1. ? ?Today he returns for HF follow up. Overall feeling fine. 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 Eating more fatty food and has gained some weight. Has some chest soreness he attributes to pulled muscle at work. Denies palpitations, CP, dizziness, edema, or PND/Orthopnea. Appetite ok. No fever or chills. Weight at home 167 pounds. Taking all medications. Smoking 5 cigs/day. Saw PVT and no longer has lifting restrictions. ? ?Cardiac Studies: ?- Echo (1/23): EF 25-30%, mild LVH, severe HK of the LV w/ restrictive LV filling pattern c/w elevated LA pressure, RV mildly reduced, mild MR.  ? ?- R/LHC (1/23): severe 3v CAD ?Ao = 99/62 (84)  ?LV = 107/11 ?RA =  5 ?RV = 30/6 ?PA = 29/9 (19) ?PCW = 12 ?Fick cardiac output/index = 4.7/2.5 ?PVR = 1.5 WU ?FA sat = 97% ?PA sat = 69%, 71% ?SVC sat = 69% ?  ?Assessment: ?Severe 3v CAD  ?Ischemic CM EF ~25-30% ?Well compensated filling pressures ?  ?Plan/Discussion:  ?Will need CABG evaluation.  ? ?Past Medical History:  ?Diagnosis Date  ? COPD (chronic obstructive pulmonary disease) ([place])    ? Diabetes mellitus, type 2 ([place])   ? HLD (hyperlipidemia)   ? Hypertension   ? Tobacco abuse   ? ?Current Outpatient Medications  ?Medication Sig Dispense Refill  ? acetaminophen (TYLENOL) 325 MG tablet Take 2 tablets (650 mg total) by mouth every 4 (four) hours as needed for moderate pain.    ? aspirin EC 325 MG EC tablet Take 1 tablet (325 mg total) by mouth daily. 30 tablet 0  ? carvedilol (COREG) 3.125 MG tablet Take 1 tablet (3.125 mg total) by mouth 2 (two) times daily with a meal. 60 tablet 4  ? Coenzyme Q10 (COQ-10 PO) Take 1 capsule by mouth daily.    ? dapagliflozin propanediol (FARXIGA) 10 MG TABS tablet Take 1 tablet (10 mg total) by mouth daily. 30 tablet 2  ? digoxin (LANOXIN) 0.125 MG tablet Take 1 tablet (0.125 mg total) by mouth daily. 30 tablet 2  ? gemfibrozil (LOPID) 600 MG tablet Take 600 mg by mouth 2 (two) times daily before a meal.    ? Insulin Glargine (BASAGLAR KWIKPEN) 100 UNIT/ML Inject 12-50 Units into the skin See admin instructions. Per sliding scale morning and night    ? metFORMIN (GLUCOPHAGE) 1000 MG tablet Take 1,000 mg by mouth 2 (two) times daily.    ? nicotine (NICODERM CQ - DOSED IN MG/24 HOURS) 14 mg/24hr patch Place 1 patch (14 mg total) onto the skin daily. (Patient not taking: Reported on  09/08/2021) 14 patch 1  ? ?No current facility-administered medications for this encounter.  ? ?Allergies  ?Allergen Reactions  ? Lisinopril   ?  Leg swelling  ? Statins   ?  Leg swelling  ? ?Social History  ? ?Socioeconomic History  ? Marital status: Divorced  ?  Spouse name: Not on file  ? Number of children: Not on file  ? Years of education: Not on file  ? Highest education level: Not on file  ?Occupational History  ? Not on file  ?Tobacco Use  ? Smoking status: Every Day  ?  Types: Cigarettes  ? Smokeless tobacco: Current  ?Substance and Sexual Activity  ? Alcohol use: Not on file  ? Drug use: Not on file  ? Sexual activity: Not on file  ?Other Topics Concern  ? Not on file  ?Social  History Narrative  ? Not on file  ? ?Social Determinants of Health  ? ?Financial Resource Strain: Not on file  ?Food Insecurity: Not on file  ?Transportation Needs: Not on file  ?Physical Activity: Not on file  ?Stress: Not on file  ?Social Connections: Not on file  ?Intimate Partner Violence: Not on file  ? ?Family History  ?Problem Relation Age of Onset  ? Hypertension Mother   ? Cancer Mother   ? Heart disease Father   ? ?BP 124/76   Pulse 87   Wt 78.9 kg (174 lb)   SpO2 97%   BMI 26.46 kg/m?  ? ?Wt Readings from Last 3 Encounters:  ?09/08/21 78.9 kg (174 lb)  ?09/07/21 78.9 kg (174 lb)  ?07/22/21 76 kg (167 lb 9.6 oz)  ? ?PHYSICAL EXAM: ?General:  NAD. No resp difficulty, appears older than stated age ?HEENT: Normal ?Neck: Supple. No JVD. Carotids 2+ bilat; no bruits. No lymphadenopathy or thryomegaly appreciated. ?Cor: PMI nondisplaced. Regular rate & rhythm. No rubs, gallops or murmurs. ?Lungs: Clear ?Abdomen: Soft, nontender, nondistended. No hepatosplenomegaly. No bruits or masses. Good bowel sounds. ?Extremities: No cyanosis, clubbing, rash, edema ?Neuro: Alert & oriented x 3, cranial nerves grossly intact. Moves all 4 extremities w/o difficulty. Affect pleasant. ? ?ReDs: 25% ? ?ASSESSMENT & PLAN: ?1. Chronic Systolic Heart Failure (New)/ Ischemic CM  ?- Echo at [facility]: LVEF 25-30%, global HK. RV mildly reduced  ?- Echo [place] (1/23): EF 25-30%, RV normal  ?- LHC (1/23): w/ severe 3V CAD ?- s/p CABG x 4 on 06/10/21. ?- NYHA II, volume looks good today. Weight up, REDs 25%. GDMT limited by hyperkalemia. ?- Increase carvedilol to 6.25 mg bid. ?- Continue Farxiga 10 mg daily.  ?- Continue digoxin 0.125 mg daily. ?- Off spiro and Entresto with hyperK. ?- Repeat echo next visit. ?- BMET and dig level today. ?  ?2. CAD ?- LHC w/ severe 3V CAD s/p CABG x 4 1/20 (LIMA to LAD, SVG to PL, SVG to OM, SVG to RI) ?- No CP. ?- He tells me he cannot tolerate statins (he has tried atorva + CoQ10 in past, does not recall  dose of statin). He declines referral to Lipid Clinic. ?- Continue ASA + beta blocker.  ?- He refused CR. ?  ?3. Type 2DM ?- Hgb A1c 7.3. On insulin. ?- Continue SGLT2i. ?  ?4. Tobacco Abuse ?- Quit for a bit, but re-started. ?- Advised cessation. ? ?5. PAD ?- R + L moderate disease by ABI 1/23. ?- Continues with claudication-type pain, able to walk through it. ? ?6. H/o hyperkalemia ?- Last K 5.1 ?- Remains of  spiro and Entresto. ?- Labs today. ? ?7. Pancreatic Lesion ?- Incidental finding of a 2.2 x 0.8 cm hypervascular structure in the body of the pancreas noted on CT. ?- MRI shows benign thrombosed vessel (had remote h/o severe pancreatitis). ?  ?Follow up next month with Dr. Haroldine Laws + echo as scheduled. ? ?Allena Katz, FNP-BC ?09/08/21 ? ?

## 2021-09-07 ENCOUNTER — Encounter: Payer: Self-pay | Admitting: Cardiothoracic Surgery

## 2021-09-07 ENCOUNTER — Ambulatory Visit (INDEPENDENT_AMBULATORY_CARE_PROVIDER_SITE_OTHER): Payer: Self-pay | Admitting: Cardiothoracic Surgery

## 2021-09-07 VITALS — BP 155/88 | HR 93 | Resp 20 | Ht 68.0 in | Wt 174.0 lb

## 2021-09-07 DIAGNOSIS — I2583 Coronary atherosclerosis due to lipid rich plaque: Secondary | ICD-10-CM

## 2021-09-07 DIAGNOSIS — I2510 Atherosclerotic heart disease of native coronary artery without angina pectoris: Secondary | ICD-10-CM

## 2021-09-07 DIAGNOSIS — Z951 Presence of aortocoronary bypass graft: Secondary | ICD-10-CM

## 2021-09-07 HISTORY — DX: Atherosclerotic heart disease of native coronary artery without angina pectoris: I25.10

## 2021-09-07 NOTE — Progress Notes (Signed)
?  HPI: ?Patient returns for routine postoperative follow-up having undergone CABG x3 for ischemic cardiomyopathy on June 10, 2021.  Patient's symptoms of angina and heart failure have significantly improved and he is back working at [employer]	[place].  Unfortunately he has resumed smoking 2 to 4 cigarettes daily.  Surgical incisions are well-healed.  Last chest x-ray was clear.  He denies any clicking or popping sensation from the sternal incision.  He denies any edema.  He is compliant with his heart meds as directed by the heart failure clinic. ? ?Current Outpatient Medications  ?Medication Sig Dispense Refill  ? acetaminophen (TYLENOL) 325 MG tablet Take 2 tablets (650 mg total) by mouth every 4 (four) hours as needed for moderate pain.    ? aspirin EC 325 MG EC tablet Take 1 tablet (325 mg total) by mouth daily. 30 tablet 0  ? carvedilol (COREG) 3.125 MG tablet Take 1 tablet (3.125 mg total) by mouth 2 (two) times daily with a meal. 60 tablet 4  ? Coenzyme Q10 (COQ-10 PO) Take 1 capsule by mouth daily.    ? dapagliflozin propanediol (FARXIGA) 10 MG TABS tablet Take 1 tablet (10 mg total) by mouth daily. 30 tablet 2  ? digoxin (LANOXIN) 0.125 MG tablet Take 1 tablet (0.125 mg total) by mouth daily. 30 tablet 2  ? gemfibrozil (LOPID) 600 MG tablet Take 600 mg by mouth 2 (two) times daily before a meal.    ? Insulin Glargine (BASAGLAR KWIKPEN) 100 UNIT/ML Inject 12-50 Units into the skin See admin instructions. Per sliding scale morning and night    ? metFORMIN (GLUCOPHAGE) 1000 MG tablet Take 1,000 mg by mouth 2 (two) times daily.    ? nicotine (NICODERM CQ - DOSED IN MG/24 HOURS) 14 mg/24hr patch Place 1 patch (14 mg total) onto the skin daily. 14 patch 1  ? ?No current facility-administered medications for this visit.  ? ? ?Physical Exam: ?Blood pressure (!) 155/88, pulse 93, resp. rate 20, height '5\' 8"'$  (1.727 m), weight 174 lb (78.9 kg), SpO2 97 %.  ? ?  ?   Exam ? ?  General- alert and comfortable ?    Neck- no JVD, no cervical adenopathy palpable, no carotid bruit ?  Lungs- clear without rales, wheezes ?  Cor- regular rate and rhythm, no murmur , gallop ?  Abdomen- soft, non-tender ?  Extremities - warm, non-tender, minimal edema ?  Neuro- oriented, appropriate, no focal weakness ? ?Diagnostic Tests: ?none ? ?Impression: ?Patient has made a good recovery after multivessel CABG for ischemic cardiomyopathy with low EF.  He denies any cardiac symptoms.  His sternal incision is well-healed and stable. ?He has been compliant with his cardiac meds as directed by cardiology clinic but unfortunately has resumed smoking.  He understands the risk of graft closure and cardiac damage from smoking. ? ?Plan: ?Patient is released from care in this office and will be followed up by cardiology.  No further sternal precautions or restrictions on his activity level. ?Importance of heart healthy diet and lifestyle discussed with patient. ? ?Dahlia Byes, MD ?[facility] ?[phone] ? ?

## 2021-09-08 ENCOUNTER — Encounter (HOSPITAL_COMMUNITY): Payer: Self-pay

## 2021-09-08 ENCOUNTER — Ambulatory Visit (HOSPITAL_COMMUNITY)
Admission: RE | Admit: 2021-09-08 | Discharge: 2021-09-08 | Disposition: A | Discharge status: Home / Self Care | Payer: BC Managed Care – PPO | Source: Ambulatory Visit | Attending: Family Medicine | Admitting: Family Medicine

## 2021-09-08 VITALS — BP 124/76 | HR 87 | Wt 174.0 lb

## 2021-09-08 DIAGNOSIS — I739 Peripheral vascular disease, unspecified: Secondary | ICD-10-CM | POA: Insufficient documentation

## 2021-09-08 DIAGNOSIS — I110 Hypertensive heart disease with heart failure: Secondary | ICD-10-CM | POA: Insufficient documentation

## 2021-09-08 DIAGNOSIS — Z79899 Other long term (current) drug therapy: Secondary | ICD-10-CM | POA: Diagnosis not present

## 2021-09-08 DIAGNOSIS — E875 Hyperkalemia: Secondary | ICD-10-CM

## 2021-09-08 DIAGNOSIS — I5022 Chronic systolic (congestive) heart failure: Secondary | ICD-10-CM | POA: Diagnosis not present

## 2021-09-08 DIAGNOSIS — F17210 Nicotine dependence, cigarettes, uncomplicated: Secondary | ICD-10-CM | POA: Diagnosis not present

## 2021-09-08 DIAGNOSIS — I255 Ischemic cardiomyopathy: Secondary | ICD-10-CM | POA: Insufficient documentation

## 2021-09-08 DIAGNOSIS — E1151 Type 2 diabetes mellitus with diabetic peripheral angiopathy without gangrene: Secondary | ICD-10-CM | POA: Insufficient documentation

## 2021-09-08 DIAGNOSIS — I2510 Atherosclerotic heart disease of native coronary artery without angina pectoris: Secondary | ICD-10-CM

## 2021-09-08 DIAGNOSIS — J449 Chronic obstructive pulmonary disease, unspecified: Secondary | ICD-10-CM | POA: Diagnosis not present

## 2021-09-08 DIAGNOSIS — Z794 Long term (current) use of insulin: Secondary | ICD-10-CM

## 2021-09-08 DIAGNOSIS — Z7984 Long term (current) use of oral hypoglycemic drugs: Secondary | ICD-10-CM | POA: Diagnosis not present

## 2021-09-08 DIAGNOSIS — Z720 Tobacco use: Secondary | ICD-10-CM | POA: Diagnosis not present

## 2021-09-08 DIAGNOSIS — E1159 Type 2 diabetes mellitus with other circulatory complications: Secondary | ICD-10-CM | POA: Diagnosis not present

## 2021-09-08 DIAGNOSIS — Z951 Presence of aortocoronary bypass graft: Secondary | ICD-10-CM | POA: Diagnosis not present

## 2021-09-08 DIAGNOSIS — E785 Hyperlipidemia, unspecified: Secondary | ICD-10-CM | POA: Diagnosis not present

## 2021-09-08 DIAGNOSIS — K869 Disease of pancreas, unspecified: Secondary | ICD-10-CM

## 2021-09-08 DIAGNOSIS — Z7982 Long term (current) use of aspirin: Secondary | ICD-10-CM | POA: Insufficient documentation

## 2021-09-08 LAB — DIGOXIN LEVEL: Digoxin Level: 0.7 ng/mL — ABNORMAL LOW (ref 0.8–2.0)

## 2021-09-08 LAB — BASIC METABOLIC PANEL
Anion gap: 9 (ref 5–15)
BUN: 36 mg/dL — ABNORMAL HIGH (ref 6–20)
CO2: 22 mmol/L (ref 22–32)
Calcium: 9.9 mg/dL (ref 8.9–10.3)
Chloride: 104 mmol/L (ref 98–111)
Creatinine, Ser: 1.34 mg/dL — ABNORMAL HIGH (ref 0.61–1.24)
GFR, Estimated: >60 mL/min (ref >60)
Glucose, Bld: 173 mg/dL — ABNORMAL HIGH (ref 70–99)
Potassium: 4.8 mmol/L (ref 3.5–5.1)
Sodium: 135 mmol/L (ref 135–145)

## 2021-09-08 MED ORDER — CARVEDILOL 6.25 MG PO TABS: 6.2500 mg | ORAL_TABLET | Freq: Two times a day (BID) | ORAL | 6 refills | Status: DC

## 2021-09-08 NOTE — Progress Notes (Signed)
ReDS Vest / Clip - 09/08/21 1500   ? ?  ? ReDS Vest / Clip  ? Station Marker C   ? Ruler Value 32.5   ? ReDS Value Range Low volume   ? ReDS Actual Value 25   ? ?  ?  ? ?  ? ? ?

## 2021-09-08 NOTE — Patient Instructions (Signed)
INCREASE Coreg to 6.25 mg, one tab twice a day ? ?Labs today ?We will only contact you if something comes back abnormal or we need to make some changes. ?Otherwise no news is good news! ? ?Keep cardiology follow up as scheduled with Dr Haroldine Laws ? ? ?Do the following things EVERYDAY: ?Weigh yourself in the morning before breakfast. Write it down and keep it in a log. ?Take your medicines as prescribed ?Eat low salt foods--Limit salt (sodium) to 2000 mg per day.  ?Stay as active as you can everyday ?Limit all fluids for the day to less than 2 liters ? ?At the [facility], you and your health needs are our priority. As part of our continuing mission to provide you with exceptional heart care, we have created designated Provider Care Teams. These Care Teams include your primary Cardiologist (physician) and Advanced Practice Providers (APPs- Physician Assistants and Nurse Practitioners) who all work together to provide you with the care you need, when you need it.  ? ?You may see any of the following providers on your designated Care Team at your next follow up: ?Dr Glori Bickers ?Dr Loralie Champagne ?Darrick Grinder, NP ?Lyda Jester, PA ?Jessica Milford,NP ?Marlyce Huge, PA ?Audry Riles, PharmD ? ? ?Please be sure to bring in all your medications bottles to every appointment.  ? ?If you have any questions or concerns before your next appointment please send [place] a message through [place] or call our office at [phone].   ? ?TO LEAVE A MESSAGE FOR THE NURSE SELECT OPTION 2, PLEASE LEAVE A MESSAGE INCLUDING: ?YOUR NAME ?DATE OF BIRTH ?CALL BACK NUMBER ?REASON FOR CALL**this is important as we prioritize the call backs ? ?YOU WILL RECEIVE A CALL BACK THE SAME DAY AS LONG AS YOU CALL BEFORE 4:00 PM ? ?

## 2021-09-09 ENCOUNTER — Other Ambulatory Visit (HOSPITAL_COMMUNITY): Payer: Self-pay | Admitting: Family Medicine

## 2021-09-09 ENCOUNTER — Other Ambulatory Visit: Payer: Self-pay | Admitting: Physician Assistant

## 2021-09-15 DIAGNOSIS — I10 Essential (primary) hypertension: Secondary | ICD-10-CM

## 2021-09-15 DIAGNOSIS — I517 Cardiomegaly: Secondary | ICD-10-CM | POA: Diagnosis not present

## 2021-09-15 DIAGNOSIS — I2510 Atherosclerotic heart disease of native coronary artery without angina pectoris: Secondary | ICD-10-CM | POA: Diagnosis not present

## 2021-09-15 DIAGNOSIS — I255 Ischemic cardiomyopathy: Secondary | ICD-10-CM | POA: Diagnosis not present

## 2021-09-15 DIAGNOSIS — Z951 Presence of aortocoronary bypass graft: Secondary | ICD-10-CM | POA: Diagnosis not present

## 2021-09-15 DIAGNOSIS — E1165 Type 2 diabetes mellitus with hyperglycemia: Secondary | ICD-10-CM | POA: Diagnosis not present

## 2021-09-16 ENCOUNTER — Encounter (HOSPITAL_COMMUNITY): Payer: BC Managed Care – PPO

## 2021-09-16 DIAGNOSIS — I255 Ischemic cardiomyopathy: Secondary | ICD-10-CM | POA: Diagnosis not present

## 2021-09-16 DIAGNOSIS — Z951 Presence of aortocoronary bypass graft: Secondary | ICD-10-CM | POA: Diagnosis not present

## 2021-09-16 DIAGNOSIS — E1165 Type 2 diabetes mellitus with hyperglycemia: Secondary | ICD-10-CM | POA: Diagnosis not present

## 2021-09-16 DIAGNOSIS — I2510 Atherosclerotic heart disease of native coronary artery without angina pectoris: Secondary | ICD-10-CM | POA: Diagnosis not present

## 2021-09-19 ENCOUNTER — Ambulatory Visit (HOSPITAL_BASED_OUTPATIENT_CLINIC_OR_DEPARTMENT_OTHER)
Admission: RE | Admit: 2021-09-19 | Discharge: 2021-09-19 | Disposition: A | Discharge status: Home / Self Care | Payer: BC Managed Care – PPO | Source: Ambulatory Visit

## 2021-09-19 ENCOUNTER — Encounter (HOSPITAL_COMMUNITY): Payer: Self-pay | Admitting: Internal Medicine

## 2021-09-19 ENCOUNTER — Ambulatory Visit (HOSPITAL_COMMUNITY)
Admission: RE | Admit: 2021-09-19 | Discharge: 2021-09-19 | Disposition: A | Discharge status: Home / Self Care | Payer: BC Managed Care – PPO | Source: Ambulatory Visit | Attending: Internal Medicine | Admitting: Internal Medicine

## 2021-09-19 VITALS — BP 150/80 | HR 78 | Wt 171.8 lb

## 2021-09-19 DIAGNOSIS — E785 Hyperlipidemia, unspecified: Secondary | ICD-10-CM | POA: Insufficient documentation

## 2021-09-19 DIAGNOSIS — E1151 Type 2 diabetes mellitus with diabetic peripheral angiopathy without gangrene: Secondary | ICD-10-CM | POA: Diagnosis not present

## 2021-09-19 DIAGNOSIS — F17210 Nicotine dependence, cigarettes, uncomplicated: Secondary | ICD-10-CM | POA: Diagnosis not present

## 2021-09-19 DIAGNOSIS — I5022 Chronic systolic (congestive) heart failure: Secondary | ICD-10-CM

## 2021-09-19 DIAGNOSIS — I10 Essential (primary) hypertension: Secondary | ICD-10-CM

## 2021-09-19 DIAGNOSIS — I5021 Acute systolic (congestive) heart failure: Secondary | ICD-10-CM

## 2021-09-19 DIAGNOSIS — Z951 Presence of aortocoronary bypass graft: Secondary | ICD-10-CM | POA: Diagnosis not present

## 2021-09-19 DIAGNOSIS — I2510 Atherosclerotic heart disease of native coronary artery without angina pectoris: Secondary | ICD-10-CM

## 2021-09-19 DIAGNOSIS — I110 Hypertensive heart disease with heart failure: Secondary | ICD-10-CM | POA: Diagnosis not present

## 2021-09-19 DIAGNOSIS — I509 Heart failure, unspecified: Secondary | ICD-10-CM

## 2021-09-19 DIAGNOSIS — J449 Chronic obstructive pulmonary disease, unspecified: Secondary | ICD-10-CM | POA: Insufficient documentation

## 2021-09-19 DIAGNOSIS — Z720 Tobacco use: Secondary | ICD-10-CM

## 2021-09-19 DIAGNOSIS — E875 Hyperkalemia: Secondary | ICD-10-CM | POA: Diagnosis not present

## 2021-09-19 LAB — ECHOCARDIOGRAM COMPLETE
Area-P 1/2: 3.77 cm2
Calc EF: 35.6 %
S' Lateral: 3.9 cm
Single Plane A2C EF: 42.2 %
Single Plane A4C EF: 34.4 %

## 2021-09-19 LAB — BASIC METABOLIC PANEL
Anion gap: 12 (ref 5–15)
BUN: 48 mg/dL — ABNORMAL HIGH (ref 6–20)
CO2: 25 mmol/L (ref 22–32)
Calcium: 10.2 mg/dL (ref 8.9–10.3)
Chloride: 98 mmol/L (ref 98–111)
Creatinine, Ser: 1.25 mg/dL — ABNORMAL HIGH (ref 0.61–1.24)
GFR, Estimated: >60 mL/min (ref >60)
Glucose, Bld: 200 mg/dL — ABNORMAL HIGH (ref 70–99)
Potassium: 5.2 mmol/L — ABNORMAL HIGH (ref 3.5–5.1)
Sodium: 135 mmol/L (ref 135–145)

## 2021-09-19 MED ORDER — ENTRESTO 49-51 MG PO TABS
1.0000 | ORAL_TABLET | Freq: Two times a day (BID) | ORAL | 11 refills | Status: DC
Start: 2021-09-19 — End: 2021-10-11

## 2021-09-19 MED ORDER — ENTRESTO 49-51 MG PO TABS: 1.0000 | ORAL_TABLET | Freq: Two times a day (BID) | ORAL | 3 refills | Status: DC

## 2021-09-19 NOTE — Patient Instructions (Signed)
Medication Changes: ? ?Stop Digoxin ? ?Start Entresto 49/51 Twice daily ? ? ?Lab Work: ? ?Labs done today, your results will be available in MyChart, we will contact you for abnormal readings. ? ? ?Testing/Procedures: ? ?Renal Ultrasound. ? ?Referrals: ? ?Please follow up with our heart failure pharmacist in 2 weeks ? ? ?Special Instructions // Education: ? ?none ? ?Follow-Up in: 6 months (November 2023)  ** please call the office in September to arrange your follow up ** ? ?At the [facility], you and your health needs are our priority. We have a designated team specialized in the treatment of Heart Failure. This Care Team includes your primary Heart Failure Specialized Cardiologist (physician), Advanced Practice Providers (APPs- Physician Assistants and Nurse Practitioners), and Pharmacist who all work together to provide you with the care you need, when you need it.  ? ?You may see any of the following providers on your designated Care Team at your next follow up: ? ?Dr Glori Bickers ?Dr Loralie Champagne ?Darrick Grinder, NP ?Lyda Jester, PA ?Jessica Milford,NP ?Marlyce Huge, PA ?Audry Riles, PharmD ? ? ?Please be sure to bring in all your medications bottles to every appointment.  ? ?Need to Contact us: ? ?If you have any questions or concerns before your next appointment please send [place] a message through [place] or call our office at [phone].   ? ?TO LEAVE A MESSAGE FOR THE NURSE SELECT OPTION 2, PLEASE LEAVE A MESSAGE INCLUDING: ?YOUR NAME ?DATE OF BIRTH ?CALL BACK NUMBER ?REASON FOR CALL**this is important as we prioritize the call backs ? ?YOU WILL RECEIVE A CALL BACK THE SAME DAY AS LONG AS YOU CALL BEFORE 4:00 PM ? ? ?

## 2021-09-19 NOTE — Addendum Note (Signed)
Encounter addended by: Jolaine Artist, MD on: 09/19/2021 5:12 PM ? Actions taken: Clinical Note Signed

## 2021-09-19 NOTE — Progress Notes (Addendum)
? ?ADVANCED HF CLINIC NOTE ? ? ?Primary Care: Marguerita Merles, MD ?HF Cardiologist: Dr. Haroldine Laws ? ?HPI: ?Kenneth Bailey is a 56 y.o. male w/ IDDM, HTN, HLD, COPD, and new diagnosis of CAD and systolic HF/iCM. ? ?Admitted 1/23 with acute CHF. Echo showed severely reduced LVEF 25-30%, mild LVH, severe HK of the LV w/ restrictive LV filling pattern c/w elevated LA pressure, RV mildly reduced, mild MR. AHF consulted. Underwent R/LHC showing severe 3v CAD, well-compensated filling pressures. He underwent CABG x 4 06/10/21 (LIMA to LAD, SVG to PL, SVG to OM, SVG to RI). Hospitalization c/b AKI and pancreatic lesion incidentally found on CT. GDMT titrated and he was discharged home, weight 156 lbs. ? ?Post hospital follow up 2/23, doing well. Beta blocker started.  Repeat K elevated 5.9. Entresto/Spiro stopped. Given lokelma.  ? ?Admitted to [facility] with HTN crisis. BP 245/195. Says he was complaint with meds. Meds adjusted.  ? ?Returns for f/u. Says he feels good now. No SOB, CP, orthopnea or PND. No edema. SBP 150-160s. Smoking 5 cigs/day. GF says he snores some.  ? ?Echo today 09/19/21 EF 40-45%  ? ?Cardiac Studies: ?- Echo (1/23): EF 25-30%, mild LVH, severe HK of the LV w/ restrictive LV filling pattern c/w elevated LA pressure, RV mildly reduced, mild MR.  ? ?- R/LHC (1/23): severe 3v CAD ?Ao = 99/62 (84)  ?LV = 107/11 ?RA =  5 ?RV = 30/6 ?PA = 29/9 (19) ?PCW = 12 ?Fick cardiac output/index = 4.7/2.5 ?PVR = 1.5 WU ?FA sat = 97% ?PA sat = 69%, 71% ?SVC sat = 69% ?  ?Assessment: ?Severe 3v CAD  ?Ischemic CM EF ~25-30% ?Well compensated filling pressures ? ? ?Past Medical History:  ?Diagnosis Date  ? COPD (chronic obstructive pulmonary disease) ([place])   ? Diabetes mellitus, type 2 ([place])   ? HLD (hyperlipidemia)   ? Hypertension   ? Tobacco abuse   ? ?Current Outpatient Medications  ?Medication Sig Dispense Refill  ? acetaminophen (TYLENOL) 325 MG tablet Take 2 tablets (650 mg total) by mouth every 4 (four) hours  as needed for moderate pain.    ? amLODipine (NORVASC) 2.5 MG tablet Take 2.5 mg by mouth daily.    ? aspirin EC 325 MG EC tablet Take 1 tablet (325 mg total) by mouth daily. 30 tablet 0  ? carvedilol (COREG) 12.5 MG tablet Take 12.5 mg by mouth 2 (two) times daily with a meal.    ? Coenzyme Q10 (COQ-10 PO) Take 1 capsule by mouth daily.    ? digoxin (LANOXIN) 0.125 MG tablet TAKE 1 TABLET BY MOUTH ONCE DAILY 30 tablet 0  ? FARXIGA 10 MG TABS tablet TAKE 1 TABLET BY MOUTH ONCE DAILY 30 tablet 0  ? gemfibrozil (LOPID) 600 MG tablet Take 600 mg by mouth 2 (two) times daily before a meal.    ? Insulin Glargine (BASAGLAR KWIKPEN) 100 UNIT/ML Inject 12-50 Units into the skin See admin instructions. Per sliding scale morning and night    ? metFORMIN (GLUCOPHAGE) 1000 MG tablet Take 1,000 mg by mouth 2 (two) times daily.    ? nicotine (NICODERM CQ - DOSED IN MG/24 HOURS) 14 mg/24hr patch Place 1 patch (14 mg total) onto the skin daily. (Patient not taking: Reported on 09/08/2021) 14 patch 1  ? ?No current facility-administered medications for this encounter.  ? ?Allergies  ?Allergen Reactions  ? Lisinopril   ?  Leg swelling  ? Statins   ?  Leg  swelling  ? ?Social History  ? ?Socioeconomic History  ? Marital status: Divorced  ?  Spouse name: Not on file  ? Number of children: Not on file  ? Years of education: Not on file  ? Highest education level: Not on file  ?Occupational History  ? Not on file  ?Tobacco Use  ? Smoking status: Every Day  ?  Types: Cigarettes  ? Smokeless tobacco: Current  ?Substance and Sexual Activity  ? Alcohol use: Not on file  ? Drug use: Not on file  ? Sexual activity: Not on file  ?Other Topics Concern  ? Not on file  ?Social History Narrative  ? Not on file  ? ?Social Determinants of Health  ? ?Financial Resource Strain: Not on file  ?Food Insecurity: Not on file  ?Transportation Needs: Not on file  ?Physical Activity: Not on file  ?Stress: Not on file  ?Social Connections: Not on file  ?Intimate  Partner Violence: Not on file  ? ?Family History  ?Problem Relation Age of Onset  ? Hypertension Mother   ? Cancer Mother   ? Heart disease Father   ? ?BP (!) 150/80   Pulse 78   Wt 77.9 kg (171 lb 12.8 oz)   SpO2 97%   BMI 26.12 kg/m?  ? ?Wt Readings from Last 3 Encounters:  ?09/19/21 77.9 kg (171 lb 12.8 oz)  ?09/08/21 78.9 kg (174 lb)  ?09/07/21 78.9 kg (174 lb)  ? ?PHYSICAL EXAM: ?General:  Well appearing. No resp difficulty ?HEENT: normal ?Neck: supple. no JVD. Carotids 2+ bilat; no bruits. No lymphadenopathy or thryomegaly appreciated. ?Cor: PMI nondisplaced. Regular rate & rhythm. No rubs, gallops or murmurs. ?Lungs: clear ?Abdomen: soft, nontender, nondistended. No hepatosplenomegaly. No bruits or masses. Good bowel sounds. ?Extremities: no cyanosis, clubbing, rash, edema ?Neuro: alert & orientedx3, cranial nerves grossly intact. moves all 4 extremities w/o difficulty. Affect pleasant ? ? ?ASSESSMENT & PLAN: ?1. Chronic Systolic Heart Failure (New)/ Ischemic CM  ?- Echo at [facility]: LVEF 25-30%, global HK. RV mildly reduced  ?- Echo [facility] (1/23): EF 25-30%, RV normal  ?- LHC (1/23): w/ severe 3V CAD ?- s/p CABG x 4 on 06/10/21. ?- Echo today 09/19/21 EF 40-45%  ?- NYHA I ?- Continue carvedilol to 12.5 mg bid. ?- Continue Farxiga 10 mg daily.  ?- Stop digoxin ?- Off spiro and Entresto with hyperK. ?- With marked HTn will restart Entresto 49/51. Labs today and 2 weeks. Will refer to PharmD to follow for hyperkalemia and start lokelma as needed. Also to help with BP management ? ?2. CAD ?- LHC w/ severe 3V CAD s/p CABG x 4 1/20 (LIMA to LAD, SVG to PL, SVG to OM, SVG to RI) ?- No s/s angina ?- He tells me he cannot tolerate statins (he has tried atorva + CoQ10 in past, does not recall dose of statin). He declines referral to Lipid Clinic. ?- Continue ASA + beta blocker.  ?  ?3. Type 2DM ?- Hgb A1c 7.3. On insulin. ?- Continue SGLT2i. ? ?4. HTN ?- severe  ?- add back Entresto as above ?- check renal artery u/s.  May need sleep study ? ?5. Tobacco Abuse ?- Quit for a bit, but re-started. Now smoking 5 cigs/day ?- Discussed cessation ? ?6. PAD ?- R + L moderate disease by ABI 1/23. ?- Continues with claudication-type pain, able to walk through it. ? ?7. H/o hyperkalemia ?- Check labs today ?- Follow closely with Entresto restart ? ?8. Pancreatic Lesion ?-  Incidental finding of a 2.2 x 0.8 cm hypervascular structure in the body of the pancreas noted on CT. ?- MRI shows benign thrombosed vessel (had remote h/o severe pancreatitis). ?  ?Glori Bickers MD ? ?09/19/21 ? ?

## 2021-09-19 NOTE — Progress Notes (Signed)
error 

## 2021-09-22 ENCOUNTER — Telehealth (HOSPITAL_COMMUNITY): Payer: Self-pay

## 2021-09-22 DIAGNOSIS — I509 Heart failure, unspecified: Secondary | ICD-10-CM

## 2021-09-22 NOTE — Telephone Encounter (Signed)
-----   Message from Jolaine Artist, MD sent at 09/21/2021  3:50 PM EDT ----- ?Recheck bmet 2 weeks ?

## 2021-09-22 NOTE — Telephone Encounter (Signed)
Patient has pending pharmacy appointment on 5/23 will recheck at this time. ? ?Orders Placed This Encounter  ?Procedures  ? Basic metabolic panel  ?  Standing Status:   Future  ?  Standing Expiration Date:   09/23/2022  ?  Order Specific Question:   Release to patient  ?  Answer:   Immediate  ? ? ?

## 2021-09-27 NOTE — Progress Notes (Signed)
Advanced Heart Failure Clinic Note   Primary Care: Marguerita Merles, MD HF Cardiologist: Dr. Haroldine Laws    HPI:  Kenneth Bailey is a 56 y.o. male w/ IDDM, HTN, HLD, COPD, and new diagnosis of CAD and systolic HF/iCM.   Admitted 05/2021 with acute CHF. Echo showed severely reduced LVEF 25-30%, mild LVH, severe HK of the LV w/ restrictive LV filling pattern c/w elevated LA pressure, RV mildly reduced, mild MR. AHF consulted. Underwent R/LHC showing severe 3v CAD, well-compensated filling pressures. He underwent CABG x 4 06/10/21 (LIMA to LAD, SVG to PL, SVG to OM, SVG to RI). Hospitalization c/b AKI and pancreatic lesion incidentally found on CT. GDMT titrated and he was discharged home, weight 156 lbs.   Post hospital follow up 06/2021, doing well. Beta blocker started.  Repeat K elevated 5.9. Entresto/Spironolactone stopped. Given Lokelma.    Admitted to [facility] with HTN crisis. BP 245/195. Says he was complaint with meds. Meds adjusted.   Echo 09/19/21 EF 40-45%    Presented to AHF Clinic 09/19/21 for follow up. Stated he felt good. No SOB, CP, orthopnea or PND. No edema. SBP 150-160s. Reported smoking 5 cigs/day. GF stated he snores some.     Today he returns to HF clinic for pharmacist medication titration. At last visit with MD Delene Loll 49/51 mg BID was restarted. Additionally, digoxin was discontinued given recovery of EF.  Overall he is feeling well today. Says he feels like his energy level has improved. No dizziness or lightheadedness. Says BP at home has been ~150/60, but he hasn't checked in a while. BP in clinic 120/70. No CP or palpitations. No SOB/DOE. Weight at home has been stable. He does not need a loop diuretic. No LEE, PND or orthopnea. Is now following a low potassium diet - is no longer eating multiple bananas per day.    HF Medications: Carvedilol 12.5 mg BID Entresto 49/51 mg BID Farxiga 10 mg daily  Has the patient been experiencing any side effects to the  medications prescribed?  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   Understanding of regimen: good Understanding of indications: good Potential of compliance: good Patient understands to avoid NSAIDs. Patient understands to avoid decongestants.    Pertinent Lab Values: 10/03/21: Serum creatinine 1.22, BUN 35, Potassium 5.0, Sodium 133  Vital Signs: Weight: 173.2 lbs (last clinic weight: 171.8 lbs) Blood pressure: 120/70  Heart rate: 81   Assessment/Plan: 1. Chronic Systolic Heart Failure (New)/ Ischemic CM  - Echo at [facility]: LVEF 25-30%, global HK. RV mildly reduced  - Echo [facility] (05/2021): EF 25-30%, RV normal  - LHC (05/2021): w/ severe 3V CAD - s/p CABG x 4 on 06/10/21. - Echo 09/19/21 EF 40-45%  - NYHA I, euvolemic on exam.  - Increase carvedilol to 25 mg BID. - Continue Entresto 49/51 mg BID - Continue Farxiga 10 mg daily.  - Digoxin discontinued 09/19/21 due to recovery of EF.  - Previously taken off spironolactone and Entresto with hyperkalemia. Monitor closely. May need Lokelma. K stable at 5.0 last week now that he is following a low potassium diet.    2. CAD - LHC w/ severe 3V CAD s/p CABG x 4 05/2018 (LIMA to LAD, SVG to PL, SVG to OM, SVG to RI) - No s/s angina - He tells me he cannot tolerate statins (he has tried atorvastatin + CoQ10 in past, does not  recall dose of statin). He declines referral to Lipid Clinic. - Continue ASA + beta blocker.    3. Type 2DM - Hgb A1c 7.3. On insulin. - Continue SGLT2i.   4. HTN - BP controlled at 120/70 in clinic today - increase carvedilol as above - renal artery u/s pending. May need sleep study   5. Tobacco Abuse - Quit for a bit, but re-started. Now smoking 5 cigs/day - Discussed cessation   6. PAD - R + L moderate disease by ABI 05/2021. - Continues with claudication-type pain, able to walk through it.   7. H/o hyperkalemia  - K  stable at 5.0 - Follow closely with Entresto restart   8. Pancreatic Lesion - Incidental finding of a 2.2 x 0.8 cm hypervascular structure in the body of the pancreas noted on CT. - MRI shows benign thrombosed vessel (had remote h/o severe pancreatitis).  Follow up 6 weeks with pharmacy clinic   Audry Riles, PharmD, BCPS, BCCP, CPP Heart Failure Clinic Pharmacist [phone]

## 2021-10-11 ENCOUNTER — Ambulatory Visit (HOSPITAL_COMMUNITY)
Admission: RE | Admit: 2021-10-11 | Discharge: 2021-10-11 | Disposition: A | Discharge status: Home / Self Care | Payer: BC Managed Care – PPO | Source: Ambulatory Visit | Attending: Pharmacist | Admitting: Pharmacist

## 2021-10-11 ENCOUNTER — Other Ambulatory Visit (HOSPITAL_COMMUNITY): Payer: Self-pay

## 2021-10-11 ENCOUNTER — Ambulatory Visit (HOSPITAL_BASED_OUTPATIENT_CLINIC_OR_DEPARTMENT_OTHER)
Admission: RE | Admit: 2021-10-11 | Discharge: 2021-10-11 | Disposition: A | Discharge status: Home / Self Care | Payer: BC Managed Care – PPO | Source: Ambulatory Visit | Attending: Cardiology | Admitting: Cardiology

## 2021-10-11 VITALS — BP 120/70 | HR 81 | Wt 173.2 lb

## 2021-10-11 DIAGNOSIS — I5022 Chronic systolic (congestive) heart failure: Secondary | ICD-10-CM | POA: Insufficient documentation

## 2021-10-11 DIAGNOSIS — I739 Peripheral vascular disease, unspecified: Secondary | ICD-10-CM | POA: Diagnosis not present

## 2021-10-11 DIAGNOSIS — Z79899 Other long term (current) drug therapy: Secondary | ICD-10-CM | POA: Diagnosis not present

## 2021-10-11 DIAGNOSIS — E119 Type 2 diabetes mellitus without complications: Secondary | ICD-10-CM | POA: Diagnosis not present

## 2021-10-11 DIAGNOSIS — E785 Hyperlipidemia, unspecified: Secondary | ICD-10-CM | POA: Diagnosis not present

## 2021-10-11 DIAGNOSIS — I5021 Acute systolic (congestive) heart failure: Secondary | ICD-10-CM

## 2021-10-11 DIAGNOSIS — Z7902 Long term (current) use of antithrombotics/antiplatelets: Secondary | ICD-10-CM | POA: Diagnosis not present

## 2021-10-11 DIAGNOSIS — Z8639 Personal history of other endocrine, nutritional and metabolic disease: Secondary | ICD-10-CM | POA: Insufficient documentation

## 2021-10-11 DIAGNOSIS — I255 Ischemic cardiomyopathy: Secondary | ICD-10-CM | POA: Insufficient documentation

## 2021-10-11 DIAGNOSIS — Z7984 Long term (current) use of oral hypoglycemic drugs: Secondary | ICD-10-CM | POA: Diagnosis not present

## 2021-10-11 DIAGNOSIS — D490 Neoplasm of unspecified behavior of digestive system: Secondary | ICD-10-CM | POA: Insufficient documentation

## 2021-10-11 DIAGNOSIS — Z794 Long term (current) use of insulin: Secondary | ICD-10-CM | POA: Insufficient documentation

## 2021-10-11 DIAGNOSIS — I2510 Atherosclerotic heart disease of native coronary artery without angina pectoris: Secondary | ICD-10-CM | POA: Insufficient documentation

## 2021-10-11 DIAGNOSIS — I110 Hypertensive heart disease with heart failure: Secondary | ICD-10-CM | POA: Insufficient documentation

## 2021-10-11 DIAGNOSIS — J449 Chronic obstructive pulmonary disease, unspecified: Secondary | ICD-10-CM | POA: Insufficient documentation

## 2021-10-11 DIAGNOSIS — F17210 Nicotine dependence, cigarettes, uncomplicated: Secondary | ICD-10-CM | POA: Diagnosis not present

## 2021-10-11 DIAGNOSIS — E875 Hyperkalemia: Secondary | ICD-10-CM | POA: Insufficient documentation

## 2021-10-11 DIAGNOSIS — Z951 Presence of aortocoronary bypass graft: Secondary | ICD-10-CM | POA: Diagnosis not present

## 2021-10-11 MED ORDER — CARVEDILOL 25 MG PO TABS: 25.0000 mg | ORAL_TABLET | Freq: Two times a day (BID) | ORAL | 3 refills | Status: DC

## 2021-10-11 MED ORDER — ENTRESTO 49-51 MG PO TABS: 1.0000 | ORAL_TABLET | Freq: Two times a day (BID) | ORAL | 3 refills | Status: DC

## 2021-10-11 MED ORDER — DAPAGLIFLOZIN PROPANEDIOL 10 MG PO TABS: 10.0000 mg | ORAL_TABLET | Freq: Every day | ORAL | 3 refills | Status: DC

## 2021-10-11 NOTE — Patient Instructions (Addendum)
It was a pleasure seeing you today!  MEDICATIONS: -We are changing your medications today -Increase carvedilol to 25 mg (1 tablet) twice daily. You may take 2 tablets of the 12.5 mg strength twice daily until you pick up the new strength.  -Call if you have questions about your medications.  LABS: -We will call you if your labs need attention.  NEXT APPOINTMENT: Return to clinic in 6 weeks with Pharmacy Clinic.  In general, to take care of your heart failure: -Limit your fluid intake to 2 Liters (half-gallon) per day.   -Limit your salt intake to ideally 2-3 grams (2000-3000 mg) per day. -Weigh yourself daily and record, and bring that "weight diary" to your next appointment.  (Weight gain of 2-3 pounds in 1 day typically means fluid weight.) -The medications for your heart are to help your heart and help you live longer.   -Please contact us before stopping any of your heart medications.  Call the clinic at [phone] with questions or to reschedule future appointments.

## 2021-10-20 ENCOUNTER — Ambulatory Visit: Payer: Self-pay | Admitting: *Deleted

## 2021-10-20 NOTE — Telephone Encounter (Signed)
Call received from patient and reported he just received a call to view his My Chart. This NT did not call patient. Patient reports he is not able to get into his My Chart account. Patient reports he is at work and will get off of work at 4 pm. Reports he does not have good service on phone at work. Reports he stopped a medication for blood pressure but could not remember name of drug due to low BP . BP this am 158/60. No documentation noted of a call to patient. Recommended patient check voicemail and patient reports voicemail is full. Recommended to call back as needed.

## 2021-11-07 NOTE — Progress Notes (Signed)
Advanced Heart Failure Clinic Note   Primary Care: Marguerita Merles, MD HF Cardiologist: Dr. Haroldine Laws    HPI:  Kenneth Bailey is a 56 y.o. male w/ IDDM, HTN, HLD, COPD, and new diagnosis of CAD and systolic HF/iCM.   Admitted 05/2021 with acute CHF. Echo showed severely reduced LVEF 25-30%, mild LVH, severe HK of the LV w/ restrictive LV filling pattern c/w elevated LA pressure, RV mildly reduced, mild MR. AHF consulted. Underwent R/LHC showing severe 3v CAD, well-compensated filling pressures. He underwent CABG x 4 06/10/21 (LIMA to LAD, SVG to PL, SVG to OM, SVG to RI). Hospitalization c/b AKI and pancreatic lesion incidentally found on CT. GDMT titrated and he was discharged home, weight 156 lbs.   Post hospital follow up 06/2021, doing well. Beta blocker started.  Repeat K elevated 5.9. Entresto/Spironolactone stopped. Given Lokelma.    Admitted to [facility] with HTN crisis. BP 245/195. Says he was complaint with meds. Meds adjusted.   Echo 09/19/21 EF 40-45%    Presented to AHF Clinic 09/19/21 for follow up. Stated he felt good. No SOB, CP, orthopnea or PND. No edema. SBP 150-160s. Reported smoking 5 cigs/day. GF stated he snores some.     Today he returns to HF clinic for pharmacist medication titration. At recent visits to clinic, Entresto 49/51 mg BID was restarted and carvedilol was increased to 25 mg BID. Additionally, digoxin was discontinued given recovery of EF. Feeling ok today. Has been out of his carvedilol for at least 1 month and says he feels so much better off of it. He had significant fatigue while on carvedilol and felt "dizzy-headed". No current dizziness or lightheadedness. No CP or palpitations. No SOB/DOE. Weight has been stable at home, 165-168 lbs. No LEE, PND or orthopnea. Does not need loop diuretic. BP at home 130/60, BP in clinic 114/60.     HF Medications: Carvedilol 25 mg BID - not taking Entresto 49/51 mg BID Farxiga 10 mg daily  Has the patient  been experiencing any side effects to the medications prescribed?  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   Understanding of regimen: good Understanding of indications: good Potential of compliance: good Patient understands to avoid NSAIDs. Patient understands to avoid decongestants.    Pertinent Lab Values: 10/03/21 ([facility]): Serum creatinine 1.22, BUN 35, Potassium 5.0, Sodium 133 BMET today pending.   Vital Signs: Weight: 173 lbs (last clinic weight: 173 lbs) Blood pressure: 114/60 Heart rate: 78   Assessment/Plan: 1. Chronic Systolic Heart Failure (New)/ Ischemic CM  - Echo at [facility]: LVEF 25-30%, global HK. RV mildly reduced  - [facility] (05/2021): EF 25-30%, RV normal  - LHC (05/2021): w/ severe 3V CAD - s/p CABG x 4 on 06/10/21. - Echo 09/19/21 EF 40-45%  - NYHA II, euvolemic on exam.  - Out of carvedilol for 1 month and complains of significant fatigue while taking it. Will not resume carvedilol. Start metoprolol succinate 25 mg daily.  - Continue Entresto 49/51 mg BID - Continue Farxiga 10 mg daily.  - Digoxin discontinued 09/19/21 due to recovery of EF.  - Previously taken off spironolactone and Entresto with hyperkalemia. Monitor closely. May need Lokelma. K stable at 5.0 10/03/21 now that he is following a low potassium diet. BMET today pending.   2. CAD - LHC w/ severe 3V CAD s/p CABG x 4 05/2018 (LIMA to LAD, SVG  to PL, SVG to OM, SVG to RI) - No s/s angina - He tells me he cannot tolerate statins (he has tried atorvastatin + CoQ10 in past, does not recall dose of statin). He declines referral to Lipid Clinic. - Continue ASA + beta blocker.    3. Type 2DM - Hgb A1c 7.3. On insulin. - Continue SGLT2i.   4. HTN - BP controlled at 114/60 in clinic today - Stop amlodipine - Start metoprolol succinate as above.    5. Tobacco Abuse - Quit for a bit, but re-started.  Now smoking 5 cigs/day - Discussed cessation   6. PAD - R + L moderate disease by ABI 05/2021. - Continues with claudication-type pain, able to walk through it.   7. H/o hyperkalemia  - K stable at 5.0 on last check - BMET today pending   8. Pancreatic Lesion - Incidental finding of a 2.2 x 0.8 cm hypervascular structure in the body of the pancreas noted on CT. - MRI shows benign thrombosed vessel (had remote h/o severe pancreatitis).  Follow up 7 weeks with pharmacy clinic   Audry Riles, PharmD, BCPS, BCCP, CPP Heart Failure Clinic Pharmacist [phone]

## 2021-11-23 ENCOUNTER — Ambulatory Visit (HOSPITAL_COMMUNITY)
Admission: RE | Admit: 2021-11-23 | Discharge: 2021-11-23 | Disposition: A | Discharge status: Home / Self Care | Payer: BC Managed Care – PPO | Source: Ambulatory Visit | Attending: Cardiology | Admitting: Cardiology

## 2021-11-23 VITALS — BP 114/60 | HR 78 | Wt 173.0 lb

## 2021-11-23 DIAGNOSIS — E119 Type 2 diabetes mellitus without complications: Secondary | ICD-10-CM | POA: Insufficient documentation

## 2021-11-23 DIAGNOSIS — Z951 Presence of aortocoronary bypass graft: Secondary | ICD-10-CM | POA: Insufficient documentation

## 2021-11-23 DIAGNOSIS — I5022 Chronic systolic (congestive) heart failure: Secondary | ICD-10-CM

## 2021-11-23 DIAGNOSIS — I255 Ischemic cardiomyopathy: Secondary | ICD-10-CM | POA: Insufficient documentation

## 2021-11-23 DIAGNOSIS — I110 Hypertensive heart disease with heart failure: Secondary | ICD-10-CM | POA: Diagnosis not present

## 2021-11-23 DIAGNOSIS — J449 Chronic obstructive pulmonary disease, unspecified: Secondary | ICD-10-CM | POA: Insufficient documentation

## 2021-11-23 DIAGNOSIS — Z794 Long term (current) use of insulin: Secondary | ICD-10-CM | POA: Diagnosis not present

## 2021-11-23 DIAGNOSIS — I2510 Atherosclerotic heart disease of native coronary artery without angina pectoris: Secondary | ICD-10-CM | POA: Diagnosis not present

## 2021-11-23 DIAGNOSIS — E785 Hyperlipidemia, unspecified: Secondary | ICD-10-CM | POA: Diagnosis not present

## 2021-11-23 LAB — BASIC METABOLIC PANEL
Anion gap: 14 (ref 5–15)
BUN: 29 mg/dL — ABNORMAL HIGH (ref 6–20)
CO2: 19 mmol/L — ABNORMAL LOW (ref 22–32)
Calcium: 9.2 mg/dL (ref 8.9–10.3)
Chloride: 100 mmol/L (ref 98–111)
Creatinine, Ser: 1.23 mg/dL (ref 0.61–1.24)
GFR, Estimated: >60 mL/min (ref >60)
Glucose, Bld: 278 mg/dL — ABNORMAL HIGH (ref 70–99)
Potassium: 4.8 mmol/L (ref 3.5–5.1)
Sodium: 133 mmol/L — ABNORMAL LOW (ref 135–145)

## 2021-11-23 MED ORDER — METOPROLOL SUCCINATE ER 25 MG PO TB24: 25.0000 mg | ORAL_TABLET | Freq: Every day | ORAL | 1 refills | Status: DC

## 2021-11-23 NOTE — Patient Instructions (Signed)
It was a pleasure seeing you today!  MEDICATIONS: -We are changing your medications today -Stop carvedilol and amlodipine. -Start metoprolol succinate 25 mg (1 tablet) daily -Call if you have questions about your medications.  LABS: -We will call you if your labs need attention.  NEXT APPOINTMENT: Return to clinic in 2 months with Pharmacy Clinic.  In general, to take care of your heart failure: -Limit your fluid intake to 2 Liters (half-gallon) per day.   -Limit your salt intake to ideally 2-3 grams (2000-3000 mg) per day. -Weigh yourself daily and record, and bring that "weight diary" to your next appointment.  (Weight gain of 2-3 pounds in 1 day typically means fluid weight.) -The medications for your heart are to help your heart and help you live longer.   -Please contact us before stopping any of your heart medications.  Call the clinic at [phone] with questions or to reschedule future appointments.

## 2021-12-28 NOTE — Progress Notes (Signed)
Advanced Heart Failure Clinic Note   Primary Care: Marguerita Merles, MD HF Cardiologist: Dr. Haroldine Laws    HPI:  Kenneth Bailey is a 56 y.o. male w/ IDDM, HTN, HLD, COPD, and new diagnosis of CAD and systolic HF/iCM.   Admitted 05/2021 with acute CHF. Echo showed severely reduced LVEF 25-30%, mild LVH, severe HK of the LV w/ restrictive LV filling pattern c/w elevated LA pressure, RV mildly reduced, mild MR. AHF consulted. Underwent R/LHC showing severe 3v CAD, well-compensated filling pressures. He underwent CABG x 4 06/10/21 (LIMA to LAD, SVG to PL, SVG to OM, SVG to RI). Hospitalization c/b AKI and pancreatic lesion incidentally found on CT. GDMT titrated and he was discharged home, weight 156 lbs.   Post hospital follow up 06/2021, doing well. Beta blocker started.  Repeat K elevated 5.9. Entresto/Spironolactone stopped. Given Lokelma.    Admitted to [facility] with HTN crisis. BP 245/195. Says he was complaint with meds. Meds adjusted.   Echo 09/19/21 EF 40-45%    Presented to AHF Clinic 09/19/21 for follow up. Stated he felt good. No SOB, CP, orthopnea or PND. No edema. SBP 150-160s. Reported smoking 5 cigs/day. GF stated he snores some.    Today he returns to HF clinic for pharmacist medication titration. At last visit to HF clinic, carvedilol was discontinued due to significant fatigue and metoprolol succinate 25 mg daily was initiated. Unfortunately he is feeling poorly today. His BP has been very low at home, 90/50 when laying down and 63/35 when standing this morning. BP in clinic was 90/60 when sitting and 84/50 when standing. Of note, he did forget to take all his medications for 3 days a few weeks ago and SBP was 200 on his home machine. Patient brought all of his medications to clinic today. It appears he is still taking carvedilol 25 mg BID and digoxin even though those medications had been discontinued at previous visits. He didn't realize he shouldn't be taking the medications.  Removed the carvedilol and digoxin from patients pill box today at his request. Patient states that he has been feeling poorly for a few weeks now. He takes his medications after eating but usually gets very dizzy when he stands up. He will usually throw up 1-2 times a day after getting dizzy. The only thing that makes the dizziness better is sitting. No CP or palpitations. Activity has been limited by dizziness and arthritis in his knees. Weight is down ~4 lbs from last visit, likely due to nausea/vomiting. No LEE, PND or orthopnea. Patient has been struggling the last few weeks. He endorsed suicidal thoughts on his birthday. Says he realized he needed to get out of his house when he thought about using his guns to shoot himself. Believes these thoughts are related to financial difficulties as he can only work 3 days per week. Says he has not had any more suicidal thoughts since he got a roommate. I have asked HF SOWO to speak with him today. Appreciate their assistance.     HF Medications: Metoprolol succinate 25 mg daily Entresto 49/51 mg BID Farxiga 10 mg daily  Has the patient been experiencing any side effects to the medications prescribed?  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   Understanding of regimen: good Understanding of indications: good Potential of compliance: good Patient understands to avoid NSAIDs. Patient understands to avoid decongestants.    Pertinent Lab  Values: 10/03/21 ([facility]): Serum creatinine 1.22, BUN 35, Potassium 5.0, Sodium 133 11/23/21: Serum creatinine 1.23, BUN 29, Potassium 4.8, Sodium 133  Vital Signs:  Weight: 168.8 lbs (last clinic weight: 173 lbs) Blood pressure: 114/60 Heart rate: 78   Assessment/Plan: 1. Chronic Systolic Heart Failure (New)/ Ischemic CM  - Echo at [facility]: LVEF 25-30%, global HK. RV mildly reduced  - Echo [facility] (05/2021): EF  25-30%, RV normal  - LHC (05/2021): w/ severe 3V CAD - s/p CABG x 4 on 06/10/21. - Echo 09/19/21 EF 40-45%  - NYHA II, euvolemic on exam.  - With significant hypotension likely related to taking both carvedilol and metoprolol, will have him HOLD all BP medications for a day. He will resume them tomorrow evening if SBP > 115.  Removed carvedilol and digoxin from pill box. Will follow up in 2 weeks for repeat BP check. He will call if he continues to feel poorly or if BP doesn't improve.  - Continue metoprolol succinate 25 mg daily.  - Continue Entresto 49/51 mg BID - Continue Farxiga 10 mg daily.  - Digoxin discontinued 09/19/21 due to recovery of EF. Removed from pill box today.  - Previously taken off spironolactone and Entresto with hyperkalemia. Monitor closely. May need Lokelma. K stable now that he is following a low potassium diet.    2. CAD - LHC w/ severe 3V CAD s/p CABG x 4 05/2018 (LIMA to LAD, SVG to PL, SVG to OM, SVG to RI) - No s/s angina - He tells me he cannot tolerate statins (he has tried atorvastatin + CoQ10 in past, does not recall dose of statin). He declines referral to Lipid Clinic. - Continue ASA + beta blocker.    3. Type 2DM - Hgb A1c 7.3. On insulin. - Continue SGLT2i.   4. HTN  - BP low today. Plan as above.    5. Tobacco Abuse - Quit for a bit, but re-started. Now smoking 5 cigs/day - Discussed cessation   6. PAD - R + L moderate disease by ABI 05/2021. - Continues with claudication-type pain, able to walk through it.   7. H/o hyperkalemia  - K stable at 4.8 on last check   8. Pancreatic Lesion - Incidental finding of a 2.2 x 0.8 cm hypervascular structure in the body of the pancreas noted on CT. - MRI shows benign thrombosed vessel (had remote h/o severe pancreatitis).  Follow up 2 weeks with pharmacy clinic to assess BP.    Audry Riles, PharmD, BCPS, BCCP, CPP Heart Failure Clinic Pharmacist [phone]

## 2022-01-09 ENCOUNTER — Ambulatory Visit (HOSPITAL_COMMUNITY)
Admission: RE | Admit: 2022-01-09 | Discharge: 2022-01-09 | Disposition: A | Discharge status: Home / Self Care | Payer: BC Managed Care – PPO | Source: Ambulatory Visit | Attending: Internal Medicine | Admitting: Internal Medicine

## 2022-01-09 VITALS — BP 84/50 | HR 85 | Wt 168.8 lb

## 2022-01-09 DIAGNOSIS — I5020 Unspecified systolic (congestive) heart failure: Secondary | ICD-10-CM | POA: Insufficient documentation

## 2022-01-09 DIAGNOSIS — I2510 Atherosclerotic heart disease of native coronary artery without angina pectoris: Secondary | ICD-10-CM | POA: Insufficient documentation

## 2022-01-09 DIAGNOSIS — E119 Type 2 diabetes mellitus without complications: Secondary | ICD-10-CM | POA: Insufficient documentation

## 2022-01-09 DIAGNOSIS — E785 Hyperlipidemia, unspecified: Secondary | ICD-10-CM | POA: Insufficient documentation

## 2022-01-09 DIAGNOSIS — I10 Essential (primary) hypertension: Secondary | ICD-10-CM | POA: Diagnosis present

## 2022-01-09 DIAGNOSIS — I5022 Chronic systolic (congestive) heart failure: Secondary | ICD-10-CM

## 2022-01-09 DIAGNOSIS — J449 Chronic obstructive pulmonary disease, unspecified: Secondary | ICD-10-CM | POA: Diagnosis not present

## 2022-01-09 NOTE — Progress Notes (Signed)
Heart and Vascular Care Navigation  01/09/2022  Kenneth Bailey Oct 03, 1965 [id number]  Reason for Referral:  concerns with suicidal ideation and financial strain.   Engaged with patient face to face for initial visit for Heart and Vascular Care Coordination.                                                                                                   Assessment:    CSW consulted to meet with pt regarding above concerns.    Pt admits to thoughts of harming himself on his birthday which was [birth year].  States he was up late watching TV and saw his guns on his wall and thought about shooting himself.  After having these thoughts a few times he left his home to meet up with friends for support.  States he had not had thoughts self harm prior to that time or since- confirms no feeling of depression or anxiety currently.    Main reported stressor is financial burden.  Had to reduce how much he works back in March after cardiac event- working 3 days a week at [employer]	[place].  This has made it hard to pay his bills.  Recently allowed a family to move in with him and split his bills- this has greatly helped with costs so no concerns with outstanding housing bills.  Hospital bills are the main source of concern at this time.  CSW called [employer] to discuss if pt would be eligible for applying for hardship program to assist with outstanding bills.   Patient owes 2,500 in outstanding bills so would not meet $5000 out of pocket minimum to qualify applying for hardship program- pt informed of this.                        HRT/VAS Care Coordination     Living arrangements for the past 2 months Mobile Home   Lives with: Roommate   Patient Current Librarian, academic   Patient Has Concern With Paying Medical Bills Yes   Home Assistive Devices/Equipment None       Social History:                                                                             SDOH Screenings    Alcohol Screen: Not on file  Depression (PHQ2-9): Not on file  Financial Resource Strain: High Risk (01/09/2022)   Overall Financial Resource Strain (CARDIA)    Difficulty of Paying Living Expenses: Hard  Food Insecurity: No Food Insecurity (01/09/2022)   Hunger Vital Sign    Worried About Running Out of Food in the Last Year: Never true    Ran Out of Food in the Last Year: Never true  Housing: Low Risk  (01/09/2022)  Housing    Last Housing Risk Score: 0  Physical Activity: Not on file  Social Connections: Not on file  Stress: Not on file  Tobacco Use: High Risk (09/19/2021)   Patient History    Smoking Tobacco Use: Every Day    Smokeless Tobacco Use: Current    Passive Exposure: Not on file  Transportation Needs: Not on file    SDOH Interventions: Financial Resources:  Financial Strain Interventions: Research scientist (physical sciences) Counseling for [place] Insecurity:  Food Insecurity Interventions: Intervention Not Indicated  Housing Insecurity:  Housing Interventions: Intervention Not Indicated  Transportation:        Follow-up plan:    Pt to call [employer] regarding setting up payment plan.  [name], LCSW Clinical Social Worker Advanced Heart Failure Clinic Desk#: [phone] Cell#: [phone]

## 2022-01-09 NOTE — Patient Instructions (Signed)
It was a pleasure seeing you today!  MEDICATIONS: -STOP taking carvedilol and digoxin.  -DO NOT taking any of your BP medications tonight or tomorrow morning. Restart Entresto, metoprolol or Farxiga tomorrow evening if the top number of your BP is over 115.  -Call if you have questions about your medications.   NEXT APPOINTMENT: Return to clinic in 2 weeks with Pharmacy Clinic.  In general, to take care of your heart failure: -Limit your fluid intake to 2 Liters (half-gallon) per day.   -Limit your salt intake to ideally 2-3 grams (2000-3000 mg) per day. -Weigh yourself daily and record, and bring that "weight diary" to your next appointment.  (Weight gain of 2-3 pounds in 1 day typically means fluid weight.) -The medications for your heart are to help your heart and help you live longer.   -Please contact us before stopping any of your heart medications.  Call the clinic at [phone] with questions or to reschedule future appointments.

## 2022-01-24 ENCOUNTER — Ambulatory Visit (HOSPITAL_COMMUNITY)
Admission: RE | Admit: 2022-01-24 | Discharge: 2022-01-24 | Disposition: A | Discharge status: Home / Self Care | Payer: BC Managed Care – PPO | Source: Ambulatory Visit | Attending: Cardiology | Admitting: Cardiology

## 2022-01-24 VITALS — BP 130/84 | HR 91 | Wt 170.6 lb

## 2022-01-24 DIAGNOSIS — Z951 Presence of aortocoronary bypass graft: Secondary | ICD-10-CM | POA: Diagnosis not present

## 2022-01-24 DIAGNOSIS — I2510 Atherosclerotic heart disease of native coronary artery without angina pectoris: Secondary | ICD-10-CM | POA: Diagnosis not present

## 2022-01-24 DIAGNOSIS — J449 Chronic obstructive pulmonary disease, unspecified: Secondary | ICD-10-CM | POA: Insufficient documentation

## 2022-01-24 DIAGNOSIS — I5022 Chronic systolic (congestive) heart failure: Secondary | ICD-10-CM | POA: Diagnosis not present

## 2022-01-24 DIAGNOSIS — Z720 Tobacco use: Secondary | ICD-10-CM | POA: Insufficient documentation

## 2022-01-24 DIAGNOSIS — E119 Type 2 diabetes mellitus without complications: Secondary | ICD-10-CM | POA: Diagnosis not present

## 2022-01-24 DIAGNOSIS — E785 Hyperlipidemia, unspecified: Secondary | ICD-10-CM | POA: Diagnosis not present

## 2022-01-24 DIAGNOSIS — I255 Ischemic cardiomyopathy: Secondary | ICD-10-CM | POA: Diagnosis not present

## 2022-01-24 DIAGNOSIS — I10 Essential (primary) hypertension: Secondary | ICD-10-CM | POA: Diagnosis not present

## 2022-01-24 DIAGNOSIS — I739 Peripheral vascular disease, unspecified: Secondary | ICD-10-CM | POA: Insufficient documentation

## 2022-01-24 NOTE — Patient Instructions (Signed)
It was a pleasure seeing you today!  MEDICATIONS: -No medication changes today -Call if you have questions about your medications.  NEXT APPOINTMENT: Return to clinic in 5 weeks with APP Clinic.  In general, to take care of your heart failure: -Limit your fluid intake to 2 Liters (half-gallon) per day.   -Limit your salt intake to ideally 2-3 grams (2000-3000 mg) per day. -Weigh yourself daily and record, and bring that "weight diary" to your next appointment.  (Weight gain of 2-3 pounds in 1 day typically means fluid weight.) -The medications for your heart are to help your heart and help you live longer.   -Please contact us before stopping any of your heart medications.  Call the clinic at [phone] with questions or to reschedule future appointments.

## 2022-01-24 NOTE — Progress Notes (Signed)
Advanced Heart Failure Clinic Note   Primary Care: Marguerita Merles, MD HF Cardiologist: Dr. Haroldine Laws    HPI:  Kenneth Bailey is a 56 y.o. male w/ IDDM, HTN, HLD, COPD, and new diagnosis of CAD and systolic HF/iCM.   Admitted 05/2021 with acute CHF. Echo showed severely reduced LVEF 25-30%, mild LVH, severe HK of the LV w/ restrictive LV filling pattern c/w elevated LA pressure, RV mildly reduced, mild MR. AHF consulted. Underwent R/LHC showing severe 3v CAD, well-compensated filling pressures. He underwent CABG x 4 06/10/21 (LIMA to LAD, SVG to PL, SVG to OM, SVG to RI). Hospitalization c/b AKI and pancreatic lesion incidentally found on CT. GDMT titrated and he was discharged home, weight 156 lbs.   Post hospital follow up 06/2021, doing well. Beta blocker started.  Repeat K elevated 5.9. Entresto/Spironolactone stopped. Given Lokelma.    Admitted to [facility] with HTN crisis. BP 245/195. Says he was complaint with meds. Meds adjusted.   Echo 09/19/21 EF 40-45%    Presented to AHF Clinic 09/19/21 for follow up. Stated he felt good. No SOB, CP, orthopnea or PND. No edema. SBP 150-160s. Reported smoking 5 cigs/day. GF stated he snores some.    Returned to [facility] on 01/09/22 for pharmacy clinic visit. He was feeling poorly. His BP had been very low at home, 90/50 when laying down and 63/35 when standing that morning. BP in clinic was 90/60 when sitting and 84/50 when standing. Of note, he did forget to take all his medications for 3 days a few weeks ago and SBP was 200 on his home machine. Patient brought all of his medications to clinic. It appeared he was still taking carvedilol 25 mg BID and digoxin even though those medications had been discontinued at previous visits. He didn't realize he shouldn't be taking the medications. Removed the carvedilol and digoxin from patients pill box at his request. Patient stated that he had been feeling poorly for a few weeks. He reported taking his  medications after eating but usually gets very dizzy when he stands up. He had been throwing up 1-2 times a day after getting dizzy. The only thing that made the dizziness better was sitting. No CP or palpitations. Activity had been limited by dizziness and arthritis in his knees. Weight was down ~4 lbs from previous visit, likely due to nausea/vomiting. No LEE, PND or orthopnea.   Today he returns to HF clinic for pharmacist medication titration. At last visit to HF clinic, carvedilol and digoxin were removed from his pill box as they had been previously discontinued. Patient was instructed extensively regarding how to take his medications correctly. Overall he is feeling a lot better today. Says he feels better than he has in years. His boss said that he hadn't seen him move this fast in a year. No dizziness or lightheadedness. BP in clinic improved at 130/84. Appetite is much improved, no more nausea/vomiting since hypotension has resolved. No SOB/DOE. Weight at home has been stable. No LEE, PND or orthopnea.    HF Medications: Metoprolol succinate 25 mg daily Entresto 49/51 mg BID Farxiga 10 mg daily  Has the patient been experiencing any side effects to the medications prescribed?  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   Understanding of regimen: good Understanding of indications: good Potential of compliance: good Patient understands to avoid NSAIDs. Patient understands to avoid decongestants.  Pertinent Lab Values: 10/03/21 ([facility] [place] [facility]): Serum creatinine 1.22, BUN 35, Potassium 5.0, Sodium 133 11/23/21: Serum creatinine 1.23, BUN 29, Potassium 4.8, Sodium 133  Vital Signs:  Weight: 168.8 lbs (last clinic weight: 173 lbs) Blood pressure: 114/60 Heart rate: 78   Assessment/Plan: 1. Chronic Systolic Heart Failure (New)/ Ischemic CM  - Echo at [facility]: LVEF 25-30%, global HK. RV  mildly reduced  - Echo [facility] (05/2021): EF 25-30%, RV normal  - LHC (05/2021): w/ severe 3V CAD - s/p CABG x 4 on 06/10/21. - Echo 09/19/21 EF 40-45%  - NYHA II, euvolemic on exam.  - Hypotension has resolved since stopping the carvedilol and digoxin. Will continue current regimen for now as patient is doing well. Will have him follow up in 5 weeks and continue uptitrating GDMT at that time as symptoms allow.  - Continue metoprolol succinate 25 mg daily.  - Continue Entresto 49/51 mg BID - Continue Farxiga 10 mg daily.  - Digoxin discontinued 09/19/21 due to recovery of EF.  - Previously taken off spironolactone and Entresto with hyperkalemia. Monitor closely. May need Lokelma. K stable now that he is following a low potassium diet.    2. CAD - LHC w/ severe 3V CAD s/p CABG x 4 05/2018 (LIMA to LAD, SVG to PL, SVG to OM, SVG to RI) - No s/s angina - He tells me he cannot tolerate statins (he has tried atorvastatin + CoQ10 in past, does not recall dose of statin). He declines referral to Lipid Clinic. - Continue ASA + beta blocker.    3. Type 2DM - Hgb A1c 7.3. On insulin. - Continue SGLT2i.   4. HTN  - Continue current regimen.   5. Tobacco Abuse - Quit for a bit, but re-started. Now smoking 5 cigs/day - Discussed cessation   6. PAD - R + L moderate disease by ABI 05/2021. - Continues with claudication-type pain, able to walk through it.   7. H/o hyperkalemia  - K stable at 4.8 on last check   8. Pancreatic Lesion - Incidental finding of a 2.2 x 0.8 cm hypervascular structure in the body of the pancreas noted on CT. - MRI shows benign thrombosed vessel (had remote h/o severe pancreatitis).    Audry Riles, PharmD, BCPS, BCCP, CPP Heart Failure Clinic Pharmacist [phone]

## 2022-02-23 NOTE — Progress Notes (Addendum)
ADVANCED HF CLINIC NOTE   Primary Care: Marguerita Merles, MD HF Cardiologist: Dr. Haroldine Laws  HPI: Gasper Hopes is a 56 y.o. male w/ IDDM, HTN, HLD, COPD, and new diagnosis of CAD and systolic HF/iCM.  Admitted 1/23 with acute CHF. Echo showed severely reduced LVEF 25-30%, mild LVH, severe HK of the LV w/ restrictive LV filling pattern c/w elevated LA pressure, RV mildly reduced, mild MR. AHF consulted. Underwent R/LHC showing severe 3v CAD, well-compensated filling pressures. He underwent CABG x 4 06/10/21 (LIMA to LAD, SVG to PL, SVG to OM, SVG to RI). Hospitalization c/b AKI and pancreatic lesion incidentally found on CT. GDMT titrated and he was discharged home, weight 156 lbs.  Post hospital follow up 2/23, doing well. Beta blocker started.  Repeat K elevated 5.9. Entresto/Spiro stopped. Given lokelma.   Admitted to [facility] with HTN crisis. BP 245/195. Says he was complaint with meds. Meds adjusted.   Echo 09/19/21 EF 40-45%   Last seen for f/u 05/23. BP elevated. Added back Entresto. Referred to Pharmacy for medication titration and adjustment antihypertensives. Coreg previously stopped d/t dizziness and hypotension. Last saw Pharmacy 01/24/22. BP stable.   Here today for HF f/u. Reports he has been feeling great. He states all of his HF meds were stopped by our clinic, I am unable to find documentation of this. Only taking 81 mg aspirin.  Feels better off the medicines, more energy. Has been working full time. 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 Denies dyspnea, orthopnea, PND or LE edema. Has some tightness across the chest muscles if lifting a heavy load.   Still smoking 1 ppd, has cut back from 3 ppd. Not ready to quit.   Cardiac Studies: - Echo (1/23): EF 25-30%, mild LVH, severe HK of the LV w/ restrictive LV filling pattern c/w elevated LA pressure, RV mildly reduced, mild MR.   - R/LHC (1/23): severe 3v CAD Ao = 99/62 (84)  LV = 107/11 RA =  5 RV =  30/6 PA = 29/9 (19) PCW = 12 Fick cardiac output/index = 4.7/2.5 PVR = 1.5 WU FA sat = 97% PA sat = 69%, 71% SVC sat = 69%   Assessment: Severe 3v CAD  Ischemic CM EF ~25-30% Well compensated filling pressures   Past Medical History:  Diagnosis Date   COPD (chronic obstructive pulmonary disease) (HCC)    Diabetes mellitus, type 2 (HCC)    HLD (hyperlipidemia)    Hypertension    Tobacco abuse    Current Outpatient Medications  Medication Sig Dispense Refill   acetaminophen (TYLENOL) 325 MG tablet Take 2 tablets (650 mg total) by mouth every 4 (four) hours as needed for moderate pain.     aspirin EC 325 MG EC tablet Take 1 tablet (325 mg total) by mouth daily. (Patient not taking: Reported on 01/09/2022) 30 tablet 0   Coenzyme Q10 (COQ-10 PO) Take 1 capsule by mouth daily.     dapagliflozin propanediol (FARXIGA) 10 MG TABS tablet Take 1 tablet (10 mg total) by mouth daily. 90 tablet 3   gemfibrozil (LOPID) 600 MG tablet Take 600 mg by mouth 2 (two) times daily before a meal.     Insulin Glargine (BASAGLAR KWIKPEN) 100 UNIT/ML Inject 12-50 Units into the skin See admin instructions. Per sliding scale morning and night     meloxicam (MOBIC) 15 MG tablet Take 15 mg by mouth daily.     metFORMIN (GLUCOPHAGE) 1000 MG tablet Take 1,000 mg  by mouth 2 (two) times daily.     metoprolol succinate (TOPROL XL) 25 MG 24 hr tablet Take 1 tablet (25 mg total) by mouth daily. 90 tablet 1   sacubitril-valsartan (ENTRESTO) 49-51 MG Take 1 tablet by mouth 2 (two) times daily. 180 tablet 3   No current facility-administered medications for this visit.   Allergies  Allergen Reactions   Lisinopril     Leg swelling   Statins     Leg swelling   Social History   Socioeconomic History   Marital status: Divorced    Spouse name: Not on file   Number of children: Not on file   Years of education: Not on file   Highest education level: Not on file  Occupational History   Not on file  Tobacco  Use   Smoking status: Every Day    Types: Cigarettes   Smokeless tobacco: Current  Substance and Sexual Activity   Alcohol use: Not on file   Drug use: Not on file   Sexual activity: Not on file  Other Topics Concern   Not on file  Social History Narrative   Not on file   Social Determinants of Health   Financial Resource Strain: High Risk (01/09/2022)   Overall Financial Resource Strain (CARDIA)    Difficulty of Paying Living Expenses: Hard  Food Insecurity: No Food Insecurity (01/09/2022)   Hunger Vital Sign    Worried About Running Out of Food in the Last Year: Never true    Ran Out of Food in the Last Year: Never true  Transportation Needs: Not on file  Physical Activity: Not on file  Stress: Not on file  Social Connections: Not on file  Intimate Partner Violence: Not on file   Family History  Problem Relation Age of Onset   Hypertension Mother    Cancer Mother    Heart disease Father    There were no vitals taken for this visit.  Wt Readings from Last 3 Encounters:  01/24/22 77.4 kg (170 lb 9.6 oz)  01/09/22 76.6 kg (168 lb 12.8 oz)  11/23/21 78.5 kg (173 lb)   PHYSICAL EXAM: General:  Well appearing. No resp difficulty HEENT: normal Neck: supple. no JVD. Carotids 2+ bilat; no bruits. No lymphadenopathy or thryomegaly appreciated. Cor: PMI nondisplaced. Regular rate & rhythm. No rubs, gallops or murmurs. Lungs: clear Abdomen: soft, nontender, nondistended. No hepatosplenomegaly. No bruits or masses. Good bowel sounds. Extremities: no cyanosis, clubbing, rash, edema Neuro: alert & orientedx3, cranial nerves grossly intact. moves all 4 extremities w/o difficulty. Affect pleasant   ASSESSMENT & PLAN: 1. Chronic Systolic Heart Failure / Ischemic CM  - Echo at [facility] (1/23): LVEF 25-30%, global HK. RV mildly reduced  - Echo [facility] (1/23): EF 25-30%, RV normal  - LHC (1/23): w/ severe 3V CAD - s/p CABG x 4 on 06/10/21. - Echo 09/19/21 EF 40-45%  - NYHA I - Stopped  metoprolol, farxiga and entresto after last visit. States he was told to stop them. Feels better off the medicines and is hesitant to add anything back. He agrees to start [place] 10 mg daily.  - Had a long discussion about the importance of GDMT in chronic management of congestive heart failure - Attempt to add back entresto next visit. - Off coreg with dizziness and hypotension - Off spiro with hx of hyperkalemia - Labs today  2. CAD - LHC w/ severe 3V CAD s/p CABG x 4 1/23 (LIMA to LAD, SVG to PL, SVG  to OM, SVG to RI) - No s/s angina - He tells me he cannot tolerate statins (he has tried atorva + CoQ10 in past, does not recall dose of statin). He declines referral to Lipid Clinic. - Continue ASA (cut back dose from 325 mg daily to 81 mg daily)   3. Type 2DM - Hgb A1c 10 in July. On insulin and metformin - Resume SGLT2i  4. HTN - BP stable today - No evidence of renal artery stenosis on renal artery duplex 05/23.  - May need sleep study if he agrees  5. Tobacco Abuse - Quit for a bit, but re-started. Now smoking 1 ppd, previously 3 ppd. - Discussed cessation  6. PAD - R + L moderate disease by ABI 1/23. - No complaints of claudication symptoms today. Very active.  7. H/o hyperkalemia - Check labs today  8. Pancreatic Lesion - Incidental finding of a 2.2 x 0.8 cm hypervascular structure in the body of the pancreas noted on CT. - MRI shows benign thrombosed vessel (had remote h/o severe pancreatitis).  Follow-up 3 weeks with APP for medication titration   Willow Reczek N PA-C  02/23/22

## 2022-02-27 ENCOUNTER — Encounter (HOSPITAL_COMMUNITY): Payer: Self-pay

## 2022-02-27 ENCOUNTER — Ambulatory Visit (HOSPITAL_COMMUNITY)
Admission: RE | Admit: 2022-02-27 | Discharge: 2022-02-27 | Disposition: A | Discharge status: Home / Self Care | Payer: BC Managed Care – PPO | Source: Ambulatory Visit | Attending: Physician Assistant | Admitting: Physician Assistant

## 2022-02-27 VITALS — BP 130/78 | HR 104 | Wt 172.0 lb

## 2022-02-27 DIAGNOSIS — Z7982 Long term (current) use of aspirin: Secondary | ICD-10-CM | POA: Diagnosis not present

## 2022-02-27 DIAGNOSIS — E785 Hyperlipidemia, unspecified: Secondary | ICD-10-CM | POA: Insufficient documentation

## 2022-02-27 DIAGNOSIS — Z8249 Family history of ischemic heart disease and other diseases of the circulatory system: Secondary | ICD-10-CM | POA: Insufficient documentation

## 2022-02-27 DIAGNOSIS — Z7984 Long term (current) use of oral hypoglycemic drugs: Secondary | ICD-10-CM | POA: Diagnosis not present

## 2022-02-27 DIAGNOSIS — E1151 Type 2 diabetes mellitus with diabetic peripheral angiopathy without gangrene: Secondary | ICD-10-CM | POA: Insufficient documentation

## 2022-02-27 DIAGNOSIS — I255 Ischemic cardiomyopathy: Secondary | ICD-10-CM | POA: Insufficient documentation

## 2022-02-27 DIAGNOSIS — R9431 Abnormal electrocardiogram [ECG] [EKG]: Secondary | ICD-10-CM | POA: Diagnosis not present

## 2022-02-27 DIAGNOSIS — I5022 Chronic systolic (congestive) heart failure: Secondary | ICD-10-CM | POA: Diagnosis not present

## 2022-02-27 DIAGNOSIS — Z951 Presence of aortocoronary bypass graft: Secondary | ICD-10-CM | POA: Insufficient documentation

## 2022-02-27 DIAGNOSIS — R9389 Abnormal findings on diagnostic imaging of other specified body structures: Secondary | ICD-10-CM | POA: Insufficient documentation

## 2022-02-27 DIAGNOSIS — I2510 Atherosclerotic heart disease of native coronary artery without angina pectoris: Secondary | ICD-10-CM | POA: Diagnosis not present

## 2022-02-27 DIAGNOSIS — I10 Essential (primary) hypertension: Secondary | ICD-10-CM

## 2022-02-27 DIAGNOSIS — Z5986 Financial insecurity: Secondary | ICD-10-CM | POA: Diagnosis not present

## 2022-02-27 DIAGNOSIS — J449 Chronic obstructive pulmonary disease, unspecified: Secondary | ICD-10-CM | POA: Diagnosis not present

## 2022-02-27 DIAGNOSIS — Z794 Long term (current) use of insulin: Secondary | ICD-10-CM | POA: Insufficient documentation

## 2022-02-27 DIAGNOSIS — Z8639 Personal history of other endocrine, nutritional and metabolic disease: Secondary | ICD-10-CM | POA: Diagnosis not present

## 2022-02-27 DIAGNOSIS — F17210 Nicotine dependence, cigarettes, uncomplicated: Secondary | ICD-10-CM | POA: Insufficient documentation

## 2022-02-27 DIAGNOSIS — Z720 Tobacco use: Secondary | ICD-10-CM

## 2022-02-27 DIAGNOSIS — I110 Hypertensive heart disease with heart failure: Secondary | ICD-10-CM | POA: Diagnosis not present

## 2022-02-27 LAB — BASIC METABOLIC PANEL
Anion gap: 8 (ref 5–15)
BUN: 27 mg/dL — ABNORMAL HIGH (ref 6–20)
CO2: 27 mmol/L (ref 22–32)
Calcium: 10.2 mg/dL (ref 8.9–10.3)
Chloride: 102 mmol/L (ref 98–111)
Creatinine, Ser: 1.17 mg/dL (ref 0.61–1.24)
GFR, Estimated: >60 mL/min (ref >60)
Glucose, Bld: 423 mg/dL — ABNORMAL HIGH (ref 70–99)
Potassium: 4.7 mmol/L (ref 3.5–5.1)
Sodium: 137 mmol/L (ref 135–145)

## 2022-02-27 LAB — BRAIN NATRIURETIC PEPTIDE: B Natriuretic Peptide: 72.1 pg/mL (ref 0.0–100.0)

## 2022-02-27 MED ORDER — DAPAGLIFLOZIN PROPANEDIOL 10 MG PO TABS: 10.0000 mg | ORAL_TABLET | Freq: Every day | ORAL | 6 refills | Status: DC

## 2022-02-27 MED ORDER — ASPIRIN 81 MG PO TBEC: 81.0000 mg | DELAYED_RELEASE_TABLET | Freq: Every day | ORAL | 6 refills | Status: DC

## 2022-02-27 NOTE — Patient Instructions (Signed)
Thank you for coming in today  Labs were done today, if any labs are abnormal the clinic will call you No news is good news  Your physician recommends that you schedule a follow-up appointment in:  3 weeks in clinic  RESTART Farxiga 10 mg 1 tablet daily  CHANGE Aspirin to 81 mg 1 tablet daily     Do the following things EVERYDAY: Weigh yourself in the morning before breakfast. Write it down and keep it in a log. Take your medicines as prescribed Eat low salt foods--Limit salt (sodium) to 2000 mg per day.  Stay as active as you can everyday Limit all fluids for the day to less than 2 liters  At the [facility], you and your health needs are our priority. As part of our continuing mission to provide you with exceptional heart care, we have created designated Provider Care Teams. These Care Teams include your primary Cardiologist (physician) and Advanced Practice Providers (APPs- Physician Assistants and Nurse Practitioners) who all work together to provide you with the care you need, when you need it.   You may see any of the following providers on your designated Care Team at your next follow up: Dr Glori Bickers Dr Loralie Champagne Dr. Roxana Hires, NP Lyda Jester, [place] [facility] [place], [place] Forestine Na, NP Audry Riles, PharmD   Please be sure to bring in all your medications bottles to every appointment.   If you have any questions or concerns before your next appointment please send [place] a message through [place] or call our office at [phone].    TO LEAVE A MESSAGE FOR THE NURSE SELECT OPTION 2, PLEASE LEAVE A MESSAGE INCLUDING: YOUR NAME DATE OF BIRTH CALL BACK NUMBER REASON FOR CALL**this is important as we prioritize the call backs  YOU WILL RECEIVE A CALL BACK THE SAME DAY AS LONG AS YOU CALL BEFORE 4:00 PM

## 2022-03-17 NOTE — Progress Notes (Signed)
Advanced Heart Failure Clinic Note   Primary Care: Marguerita Merles, MD HF Cardiologist: Dr. Haroldine Laws  HPI: Kenneth Bailey is a 56 y.o. male w/ IDDM, HTN, HLD, COPD, and new diagnosis of CAD and systolic HF/iCM.  Admitted 1/23 with acute CHF. Echo showed severely reduced LVEF 25-30%, mild LVH, severe HK of the LV w/ restrictive LV filling pattern c/w elevated LA pressure, RV mildly reduced, mild MR. AHF consulted. Underwent R/LHC showing severe 3v CAD, well-compensated filling pressures. He underwent CABG x 4 06/10/21 (LIMA to LAD, SVG to PL, SVG to OM, SVG to RI). Hospitalization c/b AKI and pancreatic lesion incidentally found on CT. GDMT titrated and he was discharged home, weight 156 lbs.  Post hospital follow up 2/23, doing well. Beta blocker started.  Repeat K elevated 5.9. Entresto/Spiro stopped. Given lokelma.   Admitted to [facility] with HTN crisis. BP 245/195. Says he was complaint with meds. Meds adjusted.   Echo 09/19/21 EF 40-45%.   Follow up 5/23,  BP elevated & Entresto added back. Referred to Pharmacy. Coreg previously stopped d/t dizziness and hypotension. Follow up 10/23, off all GDMT. Agreeable to start [place] but hesitant to restart any other GDMT.   Today he returns for HF follow up. Overall feeling fine. 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 No SOB with work duties. Main complaint is low back injury sustained after he picked up a heavy load at work. Undergoing work up with Ortho. Denies palpitations, abnormal bleeding, CP, dizziness, edema, or PND/Orthopnea. Appetite ok. No fever or chills. Does not weigh at home. Smokes 1 ppd, cut back from 3 ppd. Drinks 2-3 shots every Friday night.  Cardiac Studies:  - Echo (5/23): EF 40-45%  - Echo (1/23): EF 25-30%, mild LVH, severe HK of the LV w/ restrictive LV filling pattern c/w elevated LA pressure, RV mildly reduced, mild MR.   - R/LHC (1/23): severe 3v CAD  Ao  = 99/62 (84)  LV = 107/11 RA =  5 RV = 30/6 PA = 29/9 (19) PCW = 12 Fick cardiac output/index = 4.7/2.5 PVR = 1.5 WU FA sat = 97% PA sat = 69%, 71% SVC sat = 69%   Assessment: Severe 3v CAD  Ischemic CM EF ~25-30% Well compensated filling pressures   Past Medical History:  Diagnosis Date   COPD (chronic obstructive pulmonary disease) (HCC)    Diabetes mellitus, type 2 (HCC)    HLD (hyperlipidemia)    Hypertension    Tobacco abuse    Current Outpatient Medications  Medication Sig Dispense Refill   acetaminophen (TYLENOL) 500 MG tablet Patient takes 6 tablets by mouth in the morning evening and night.     aspirin EC 81 MG tablet Take 1 tablet (81 mg total) by mouth daily. Swallow whole. 30 tablet 6   dapagliflozin propanediol (FARXIGA) 10 MG TABS tablet Take 1 tablet (10 mg total) by mouth daily before breakfast. 30 tablet 6   gemfibrozil (LOPID) 600 MG tablet Take 600 mg by mouth 2 (two) times daily before a meal.     Insulin Glargine (BASAGLAR KWIKPEN) 100 UNIT/ML Inject 12-50 Units into the skin See admin instructions. Per sliding scale morning and night     metFORMIN (GLUCOPHAGE) 1000 MG tablet Take 1,000 mg by mouth 2 (two) times daily.     No current facility-administered medications for this encounter.   Allergies  Allergen Reactions   Lisinopril     Leg swelling  Statins     Leg swelling   Social History   Socioeconomic History   Marital status: Divorced    Spouse name: Not on file   Number of children: Not on file   Years of education: Not on file   Highest education level: Not on file  Occupational History   Not on file  Tobacco Use   Smoking status: Every Day    Types: Cigarettes   Smokeless tobacco: Current  Substance and Sexual Activity   Alcohol use: Not on file   Drug use: Not on file   Sexual activity: Not on file  Other Topics Concern   Not on file  Social History Narrative   Not on file   Social Determinants of Health   Financial  Resource Strain: High Risk (01/09/2022)   Overall Financial Resource Strain (CARDIA)    Difficulty of Paying Living Expenses: Hard  Food Insecurity: No Food Insecurity (01/09/2022)   Hunger Vital Sign    Worried About Running Out of Food in the Last Year: Never true    Ran Out of Food in the Last Year: Never true  Transportation Needs: Not on file  Physical Activity: Not on file  Stress: Not on file  Social Connections: Not on file  Intimate Partner Violence: Not on file   Family History  Problem Relation Age of Onset   Hypertension Mother    Cancer Mother    Heart disease Father    BP 120/80   Pulse 85   Wt 78.2 kg (172 lb 6.4 oz)   SpO2 96%   BMI 26.21 kg/m   Wt Readings from Last 3 Encounters:  03/20/22 78.2 kg (172 lb 6.4 oz)  02/27/22 78 kg (172 lb)  01/24/22 77.4 kg (170 lb 9.6 oz)   PHYSICAL EXAM: General:  NAD. No resp difficulty, appears older than stated age. HEENT: Normal Neck: Supple. No JVD. Carotids 2+ bilat; no bruits. No lymphadenopathy or thryomegaly appreciated. Cor: PMI nondisplaced. Regular rate & rhythm. No rubs, gallops or murmurs. Lungs: Clear Abdomen: Soft, nontender, nondistended. No hepatosplenomegaly. No bruits or masses. Good bowel sounds. Extremities: No cyanosis, clubbing, rash, edema Neuro: Alert & oriented x 3, cranial nerves grossly intact. Moves all 4 extremities w/o difficulty. Affect pleasant.  ASSESSMENT & PLAN: 1. Chronic Systolic Heart Failure / Ischemic CM  - Echo at [facility] (1/23): LVEF 25-30%, global HK. RV mildly reduced  - Echo [facility] (1/23): EF 25-30%, RV normal  - LHC (1/23): w/ severe 3V CAD. - s/p CABG x 4 on 06/10/21. - Echo (09/19/21) EF 40-45%.  - NYHA I. Volume looks good today. - Restart Entresto 24/26 mg bid. - Continue Farxiga 10 mg daily. No GU symptoms. - Off Coreg with dizziness and hypotension. - Off spiro with hyperkalemia. - Had a long discussion about the importance of GDMT in chronic management of congestive  heart failure. He is not interested in adding anymore meds at this time. - Labs reviewed from 03/13/22, K 4.8, SCr 1.49. Repeat BMET in 2 weeks.  2. CAD - LHC w/ severe 3V CAD s/p CABG x 4 1/23 (LIMA to LAD, SVG to PL, SVG to OM, SVG to RI) - No s/s angina. - He tells me he cannot tolerate statins (he has tried atorva + CoQ10 in past, does not recall dose of statin). He declines referral to Lipid Clinic. - Continue ASA 81.    3. Type 2DM - Hgb A1c 10 (7/23). On insulin and metformin. - Continue SGLT2i.  4. HTN - Well controlled today. - No evidence of renal artery stenosis on renal artery duplex 5/23.  - May need sleep study if he agrees.  5. Tobacco Abuse - Quit for a bit, but re-started. Now smoking 1 ppd, previously 3 ppd. - Discussed cessation.  6. PAD - R + L moderate disease by ABI 1/23. - No claudication symptoms. Very active.  7. H/o hyperkalemia - Last K 4.8 03/13/22. - Labs in 2 weeks.  8. Pancreatic Lesion - Incidental finding of a 2.2 x 0.8 cm hypervascular structure in the body of the pancreas noted on CT. - MRI shows benign thrombosed vessel (had remote h/o severe pancreatitis).  Follow up in 6 months with Dr. Haroldine Laws.   Maricela Bo [facility] FNP-BC 03/20/22

## 2022-03-20 ENCOUNTER — Ambulatory Visit (HOSPITAL_COMMUNITY)
Admission: RE | Admit: 2022-03-20 | Discharge: 2022-03-20 | Disposition: A | Discharge status: Home / Self Care | Payer: BC Managed Care – PPO | Source: Ambulatory Visit | Attending: Family Medicine | Admitting: Family Medicine

## 2022-03-20 ENCOUNTER — Encounter (HOSPITAL_COMMUNITY): Payer: Self-pay

## 2022-03-20 VITALS — BP 120/80 | HR 85 | Wt 172.4 lb

## 2022-03-20 DIAGNOSIS — Z951 Presence of aortocoronary bypass graft: Secondary | ICD-10-CM | POA: Diagnosis not present

## 2022-03-20 DIAGNOSIS — I110 Hypertensive heart disease with heart failure: Secondary | ICD-10-CM | POA: Insufficient documentation

## 2022-03-20 DIAGNOSIS — I959 Hypotension, unspecified: Secondary | ICD-10-CM | POA: Insufficient documentation

## 2022-03-20 DIAGNOSIS — Z8719 Personal history of other diseases of the digestive system: Secondary | ICD-10-CM | POA: Insufficient documentation

## 2022-03-20 DIAGNOSIS — I739 Peripheral vascular disease, unspecified: Secondary | ICD-10-CM | POA: Diagnosis not present

## 2022-03-20 DIAGNOSIS — I5022 Chronic systolic (congestive) heart failure: Secondary | ICD-10-CM

## 2022-03-20 DIAGNOSIS — E119 Type 2 diabetes mellitus without complications: Secondary | ICD-10-CM | POA: Diagnosis not present

## 2022-03-20 DIAGNOSIS — R42 Dizziness and giddiness: Secondary | ICD-10-CM | POA: Insufficient documentation

## 2022-03-20 DIAGNOSIS — E875 Hyperkalemia: Secondary | ICD-10-CM | POA: Diagnosis not present

## 2022-03-20 DIAGNOSIS — I255 Ischemic cardiomyopathy: Secondary | ICD-10-CM | POA: Insufficient documentation

## 2022-03-20 DIAGNOSIS — J449 Chronic obstructive pulmonary disease, unspecified: Secondary | ICD-10-CM | POA: Insufficient documentation

## 2022-03-20 DIAGNOSIS — Z720 Tobacco use: Secondary | ICD-10-CM

## 2022-03-20 DIAGNOSIS — E1159 Type 2 diabetes mellitus with other circulatory complications: Secondary | ICD-10-CM | POA: Diagnosis not present

## 2022-03-20 DIAGNOSIS — I10 Essential (primary) hypertension: Secondary | ICD-10-CM | POA: Diagnosis not present

## 2022-03-20 DIAGNOSIS — E785 Hyperlipidemia, unspecified: Secondary | ICD-10-CM | POA: Diagnosis not present

## 2022-03-20 DIAGNOSIS — I2510 Atherosclerotic heart disease of native coronary artery without angina pectoris: Secondary | ICD-10-CM | POA: Diagnosis not present

## 2022-03-20 DIAGNOSIS — F17210 Nicotine dependence, cigarettes, uncomplicated: Secondary | ICD-10-CM | POA: Insufficient documentation

## 2022-03-20 DIAGNOSIS — Z794 Long term (current) use of insulin: Secondary | ICD-10-CM | POA: Diagnosis not present

## 2022-03-20 DIAGNOSIS — K869 Disease of pancreas, unspecified: Secondary | ICD-10-CM

## 2022-03-20 MED ORDER — ENTRESTO 24-26 MG PO TABS: 1.0000 | ORAL_TABLET | Freq: Two times a day (BID) | ORAL | 7 refills | Status: DC

## 2022-03-20 NOTE — Patient Instructions (Addendum)
Thank you for coming in today  Your physician recommends that you return for lab work in:  BMET 2 weeks  RESTART Entresto 24/26 mg 1 tablet twice daily  Your physician recommends that you schedule a follow-up appointment in:  6 month with Dr. Haroldine Laws     Do the following things EVERYDAY: Weigh yourself in the morning before breakfast. Write it down and keep it in a log. Take your medicines as prescribed Eat low salt foods--Limit salt (sodium) to 2000 mg per day.  Stay as active as you can everyday Limit all fluids for the day to less than 2 liters   At the [facility], you and your health needs are our priority. As part of our continuing mission to provide you with exceptional heart care, we have created designated Provider Care Teams. These Care Teams include your primary Cardiologist (physician) and Advanced Practice Providers (APPs- Physician Assistants and Nurse Practitioners) who all work together to provide you with the care you need, when you need it.   You may see any of the following providers on your designated Care Team at your next follow up: Dr Glori Bickers Dr Loralie Champagne Dr. Roxana Hires, NP Lyda Jester, [place] [facility] [place], [place] Forestine Na, NP Audry Riles, PharmD   Please be sure to bring in all your medications bottles to every appointment.   If you have any questions or concerns before your next appointment please send [place] a message through [place] or call our office at [phone].    TO LEAVE A MESSAGE FOR THE NURSE SELECT OPTION 2, PLEASE LEAVE A MESSAGE INCLUDING: YOUR NAME DATE OF BIRTH CALL BACK NUMBER REASON FOR CALL**this is important as we prioritize the call backs  YOU WILL RECEIVE A CALL BACK THE SAME DAY AS LONG AS YOU CALL BEFORE 4:00 PM

## 2022-03-21 ENCOUNTER — Encounter (HOSPITAL_COMMUNITY): Payer: Self-pay

## 2022-04-03 ENCOUNTER — Ambulatory Visit (HOSPITAL_COMMUNITY)
Admission: RE | Admit: 2022-04-03 | Discharge: 2022-04-03 | Disposition: A | Discharge status: Home / Self Care | Payer: BC Managed Care – PPO | Source: Ambulatory Visit | Attending: Internal Medicine | Admitting: Internal Medicine

## 2022-04-03 DIAGNOSIS — I5022 Chronic systolic (congestive) heart failure: Secondary | ICD-10-CM | POA: Diagnosis present

## 2022-04-03 LAB — BASIC METABOLIC PANEL
Anion gap: 10 (ref 5–15)
BUN: 25 mg/dL — ABNORMAL HIGH (ref 6–20)
CO2: 25 mmol/L (ref 22–32)
Calcium: 9.8 mg/dL (ref 8.9–10.3)
Chloride: 103 mmol/L (ref 98–111)
Creatinine, Ser: 1.36 mg/dL — ABNORMAL HIGH (ref 0.61–1.24)
GFR, Estimated: >60 mL/min (ref >60)
Glucose, Bld: 351 mg/dL — ABNORMAL HIGH (ref 70–99)
Potassium: 4.5 mmol/L (ref 3.5–5.1)
Sodium: 138 mmol/L (ref 135–145)

## 2022-04-04 ENCOUNTER — Telehealth (HOSPITAL_COMMUNITY): Payer: Self-pay

## 2022-04-04 NOTE — Telephone Encounter (Signed)
Patient aware and agreeable. 

## 2022-08-30 ENCOUNTER — Telehealth (HOSPITAL_COMMUNITY): Payer: Self-pay

## 2022-08-30 ENCOUNTER — Telehealth (HOSPITAL_COMMUNITY): Payer: Self-pay | Admitting: Vascular Surgery

## 2022-08-30 NOTE — Telephone Encounter (Signed)
Lvm to make f/u appt w/ db next ava

## 2022-08-30 NOTE — Telephone Encounter (Signed)
Can you call him and schedule his follow up?

## 2022-09-04 ENCOUNTER — Ambulatory Visit (HOSPITAL_COMMUNITY)
Admission: RE | Admit: 2022-09-04 | Discharge: 2022-09-04 | Disposition: A | Discharge status: Home / Self Care | Payer: BC Managed Care – PPO | Source: Ambulatory Visit | Attending: Internal Medicine | Admitting: Internal Medicine

## 2022-09-04 ENCOUNTER — Encounter (HOSPITAL_COMMUNITY): Payer: Self-pay | Admitting: Internal Medicine

## 2022-09-04 VITALS — BP 110/70 | HR 89 | Wt 167.8 lb

## 2022-09-04 DIAGNOSIS — Z8719 Personal history of other diseases of the digestive system: Secondary | ICD-10-CM | POA: Diagnosis not present

## 2022-09-04 DIAGNOSIS — I5022 Chronic systolic (congestive) heart failure: Secondary | ICD-10-CM | POA: Diagnosis not present

## 2022-09-04 DIAGNOSIS — I959 Hypotension, unspecified: Secondary | ICD-10-CM | POA: Insufficient documentation

## 2022-09-04 DIAGNOSIS — J449 Chronic obstructive pulmonary disease, unspecified: Secondary | ICD-10-CM | POA: Diagnosis not present

## 2022-09-04 DIAGNOSIS — E785 Hyperlipidemia, unspecified: Secondary | ICD-10-CM | POA: Diagnosis not present

## 2022-09-04 DIAGNOSIS — E875 Hyperkalemia: Secondary | ICD-10-CM | POA: Diagnosis not present

## 2022-09-04 DIAGNOSIS — R42 Dizziness and giddiness: Secondary | ICD-10-CM | POA: Diagnosis not present

## 2022-09-04 DIAGNOSIS — I2510 Atherosclerotic heart disease of native coronary artery without angina pectoris: Secondary | ICD-10-CM | POA: Diagnosis not present

## 2022-09-04 DIAGNOSIS — I255 Ischemic cardiomyopathy: Secondary | ICD-10-CM | POA: Insufficient documentation

## 2022-09-04 DIAGNOSIS — Z951 Presence of aortocoronary bypass graft: Secondary | ICD-10-CM | POA: Diagnosis not present

## 2022-09-04 DIAGNOSIS — Z794 Long term (current) use of insulin: Secondary | ICD-10-CM | POA: Insufficient documentation

## 2022-09-04 DIAGNOSIS — I110 Hypertensive heart disease with heart failure: Secondary | ICD-10-CM | POA: Insufficient documentation

## 2022-09-04 DIAGNOSIS — Z79899 Other long term (current) drug therapy: Secondary | ICD-10-CM | POA: Insufficient documentation

## 2022-09-04 DIAGNOSIS — Z720 Tobacco use: Secondary | ICD-10-CM

## 2022-09-04 DIAGNOSIS — E119 Type 2 diabetes mellitus without complications: Secondary | ICD-10-CM | POA: Insufficient documentation

## 2022-09-04 DIAGNOSIS — F17210 Nicotine dependence, cigarettes, uncomplicated: Secondary | ICD-10-CM | POA: Insufficient documentation

## 2022-09-04 DIAGNOSIS — I739 Peripheral vascular disease, unspecified: Secondary | ICD-10-CM

## 2022-09-04 DIAGNOSIS — I10 Essential (primary) hypertension: Secondary | ICD-10-CM

## 2022-09-04 LAB — LIPID PANEL
Cholesterol: 205 mg/dL — ABNORMAL HIGH (ref 0–200)
HDL: 25 mg/dL — ABNORMAL LOW (ref >40)
LDL Cholesterol: UNDETERMINED mg/dL (ref 0–99)
Total CHOL/HDL Ratio: 8.2 RATIO
Triglycerides: 769 mg/dL — ABNORMAL HIGH (ref <150)
VLDL: UNDETERMINED mg/dL (ref 0–40)

## 2022-09-04 LAB — COMPREHENSIVE METABOLIC PANEL
ALT: 13 U/L (ref 0–44)
AST: 23 U/L (ref 15–41)
Albumin: 3.8 g/dL (ref 3.5–5.0)
Alkaline Phosphatase: 96 U/L (ref 38–126)
Anion gap: 12 (ref 5–15)
BUN: 29 mg/dL — ABNORMAL HIGH (ref 6–20)
CO2: 25 mmol/L (ref 22–32)
Calcium: 9.4 mg/dL (ref 8.9–10.3)
Chloride: 97 mmol/L — ABNORMAL LOW (ref 98–111)
Creatinine, Ser: 1.31 mg/dL — ABNORMAL HIGH (ref 0.61–1.24)
GFR, Estimated: >60 mL/min (ref >60)
Glucose, Bld: 378 mg/dL — ABNORMAL HIGH (ref 70–99)
Potassium: 4.3 mmol/L (ref 3.5–5.1)
Sodium: 134 mmol/L — ABNORMAL LOW (ref 135–145)
Total Bilirubin: 0.8 mg/dL (ref 0.3–1.2)
Total Protein: 6.9 g/dL (ref 6.5–8.1)

## 2022-09-04 LAB — LDL CHOLESTEROL, DIRECT: Direct LDL: 81 mg/dL (ref 0–99)

## 2022-09-04 MED ORDER — NITROGLYCERIN 0.4 MG SL SUBL: 0.4000 mg | SUBLINGUAL_TABLET | SUBLINGUAL | 3 refills | Status: DC | PRN

## 2022-09-04 NOTE — Addendum Note (Signed)
Encounter addended by: Linda Hedges, RN on: 09/04/2022 3:45 PM  Actions taken: Order list changed, Diagnosis association updated, Specialty comments modified, Clinical Note Signed

## 2022-09-04 NOTE — Addendum Note (Signed)
Encounter addended by: Linda Hedges, RN on: 09/04/2022 3:44 PM  Actions taken: Pharmacy for encounter modified, Order list changed, Diagnosis association updated, Clinical Note Signed

## 2022-09-04 NOTE — Progress Notes (Signed)
Advanced Heart Failure Clinic Note   Primary Care: [name], MD HF Cardiologist: Dr. Gala Romney  HPI: Kenneth Bailey is a 57 y.o. male w/ IDDM, HTN, HLD, COPD, CAD and systolic HF/iCM.  Admitted 1/23 with acute CHF. Echo EF25-30%, mild LVH, severe HK of the LV w/ restrictive.  R/LHC with 3v CAD, well-compensated filling pressures -> CABG x 4 06/10/21 (LIMA to LAD, SVG to PL, SVG to OM, SVG to RI).  Post hospital follow up 2/23, doing well. Beta blocker started.  Repeat K elevated 5.9. Entresto/Spiro stopped. Given lokelma.   Admitted to [facility] with HTN crisis. BP 245/195. Says he was complaint with meds. Meds adjusted.   Echo 09/19/21 EF 40-45%.   Follow up 5/23,  BP elevated & Entresto added back. Referred to Pharmacy. Coreg previously stopped d/t dizziness and hypotension. Follow up 10/23, off all GDMT. Agreeable to start [place] but hesitant to restart any other GDMT.   Today he returns for HF follow up. Overall feeling fine. 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 Denies CP or SOB. Not taking any heart medicines. Says he stopped them 2 weeks ago for colonoscopy. Previously was taking [place]. Says he couldn't tolerate any other meds because they made him sick. Smoking 1.5 ppd   Cardiac Studies:  - Echo (5/23): EF 40-45%  - Echo (1/23): EF 25-30%, mild LVH, severe HK of the LV w/ restrictive LV filling pattern c/w elevated LA pressure, RV mildly reduced, mild MR.   - R/LHC (1/23): severe 3v CAD  Ao = 99/62 (84)  LV = 107/11 RA =  5 RV = 30/6 PA = 29/9 (19) PCW = 12 Fick cardiac output/index = 4.7/2.5 PVR = 1.5 WU FA sat = 97% PA sat = 69%, 71% SVC sat = 69%   Assessment: Severe 3v CAD  Ischemic CM EF ~25-30% Well compensated filling pressures   Past Medical History:  Diagnosis Date   COPD (chronic obstructive pulmonary disease)    Diabetes mellitus, type 2    HLD (hyperlipidemia)     Hypertension    Tobacco abuse    Current Outpatient Medications  Medication Sig Dispense Refill   aspirin EC 81 MG tablet Take 1 tablet (81 mg total) by mouth daily. Swallow whole. 30 tablet 6   gemfibrozil (LOPID) 600 MG tablet Take 600 mg by mouth 2 (two) times daily before a meal.     Insulin Glargine (BASAGLAR KWIKPEN) 100 UNIT/ML Inject 12-50 Units into the skin See admin instructions. Per sliding scale morning and night     metFORMIN (GLUCOPHAGE) 1000 MG tablet Take 1,000 mg by mouth 2 (two) times daily.     sacubitril-valsartan (ENTRESTO) 24-26 MG Take 1 tablet by mouth 2 (two) times daily. 60 tablet 7   dapagliflozin propanediol (FARXIGA) 10 MG TABS tablet Take 1 tablet (10 mg total) by mouth daily before breakfast. (Patient not taking: Reported on 09/04/2022) 30 tablet 6   No current facility-administered medications for this encounter.   Allergies  Allergen Reactions   Lisinopril     Leg swelling   Statins     Leg swelling   Social History   Socioeconomic History   Marital status: Divorced    Spouse name: Not on file   Number of children: Not on file   Years of education: Not on file   Highest education level: Not on file  Occupational History   Not on file  Tobacco  Use   Smoking status: Every Day    Types: Cigarettes   Smokeless tobacco: Current  Substance and Sexual Activity   Alcohol use: Not on file   Drug use: Not on file   Sexual activity: Not on file  Other Topics Concern   Not on file  Social History Narrative   Not on file   Social Determinants of Health   Financial Resource Strain: High Risk (01/09/2022)   Overall Financial Resource Strain (CARDIA)    Difficulty of Paying Living Expenses: Hard  Food Insecurity: No Food Insecurity (01/09/2022)   Hunger Vital Sign    Worried About Running Out of Food in the Last Year: Never true    Ran Out of Food in the Last Year: Never true  Transportation Needs: Not on file  Physical Activity: Not on file   Stress: Not on file  Social Connections: Not on file  Intimate Partner Violence: Not on file   Family History  Problem Relation Age of Onset   Hypertension Mother    Cancer Mother    Heart disease Father    BP 110/70   Pulse 89   Wt 76.1 kg (167 lb 12.8 oz)   SpO2 98%   BMI 25.51 kg/m   Wt Readings from Last 3 Encounters:  09/04/22 76.1 kg (167 lb 12.8 oz)  03/20/22 78.2 kg (172 lb 6.4 oz)  02/27/22 78 kg (172 lb)   PHYSICAL EXAM: General: Sitting in chair. No resp difficulty HEENT: normal Neck: supple. no JVD. Carotids 2+ bilat; no bruits. No lymphadenopathy or thryomegaly appreciated. Cor: PMI nondisplaced. Regular rate & rhythm. No rubs, gallops or murmurs. Lungs: clear but decreased Abdomen: soft, nontender, nondistended. No hepatosplenomegaly. No bruits or masses. Good bowel sounds. Extremities: no cyanosis, clubbing, rash, edema Neuro: alert & orientedx3, cranial nerves grossly intact. moves all 4 extremities w/o difficulty. Affect pleasant  ECG: NSR 89 No ST-T wave abnormalities. Personally reviewed   ASSESSMENT & PLAN: 1. Chronic Systolic Heart Failure / Ischemic CM  - Echo at [facility] (1/23): LVEF 25-30%, global HK. RV mildly reduced  - Echo [facility] (1/23): EF 25-30%, RV normal  - LHC (1/23): w/ severe 3V CAD. - s/p CABG x 4 on 06/10/21. - Echo (09/19/21) EF 40-45%.  - NYHA I Volume status ok - Resume Entresto 24/26 mg bid. - Resume Farxiga 10 mg daily.  - Off Coreg with dizziness and hypotension. - Off spiro with hyperkalemia.  2. CAD - LHC w/ severe 3V CAD s/p CABG x 4 1/23 (LIMA to LAD, SVG to PL, SVG to OM, SVG to RI) - No s/s angina - He tells me he cannot tolerate statins (he has tried atorva + CoQ10 in past, does not recall dose of statin). He declines referral to Lipid Clinic. - Continue ASA 81.    3. Type 2DM - Hgb A1c 10 (7/23). On insulin and metformin. - Encouraged to restart Farxiga  4. HTN - Well controlled today. - No evidence of renal  artery stenosis on renal artery duplex 5/23.  - Resume meds as above  5. Tobacco Abuse - Quit for a bit, but re-started. Now smoking 1 ppd, previously 3 ppd. - Discussed cessation.  6. PAD - R + L moderate disease by ABI 1/23. - No claudication symptoms. Very active. - Discussed smoking cessation. Says he is trying  7. H/o hyperkalemia - Last K 4.8 03/13/22. - Recheck today  8. Pancreatic Lesion - Incidental finding of a 2.2 x 0.8 cm hypervascular  structure in the body of the pancreas noted on CT. - MRI shows benign thrombosed vessel (had remote h/o severe pancreatitis).    Arvilla Meres MD 09/04/22

## 2022-09-04 NOTE — Patient Instructions (Addendum)
CONGRATULATIONS YOU HAVE GRADUATED FROM THE AD[facility]ANC[facility].  Labs done today, your results will be available in MyChart, we will contact you for abnormal readings.  Your physician has requested that you have an echocardiogram. Echocardiography is a painless test that uses sound waves to create images of your heart. It provides your doctor with information about the size and shape of your heart and how well your heart's chambers and valves are working. This procedure takes approximately one hour. There are no restrictions for this procedure. Please do NOT wear cologne, perfume, aftershave, or lotions (deodorant is allowed). Please arrive 15 minutes prior to your appointment time.   You have been referred to Dr. Dulce Sellar. His office will call you to arrange your appointment.  Your physician recommends that you schedule a follow-up appointment in: AS NEEDED   If you have any questions or concerns before your next appointment please send [place] a message through [place] or call our office at [phone].    TO LEAVE A MESSAGE FOR THE NURSE SELECT OPTION 2, PLEASE LEAVE A MESSAGE INCLUDING: YOUR NAME DATE OF BIRTH CALL BACK NUMBER REASON FOR CALL**this is important as we prioritize the call backs  YOU WILL RECEIVE A CALL BACK THE SAME DAY AS LONG AS YOU CALL BEFORE 4:00 PM  At the [facility] Clinic, you and your health needs are our priority. As part of our continuing mission to provide you with exceptional heart care, we have created designated Provider Care Teams. These Care Teams include your primary Cardiologist (physician) and Advanced Practice Providers (APPs- Physician Assistants and Nurse Practitioners) who all work together to provide you with the care you need, when you need it.   You may see any of the following providers on your designated Care Team at your next follow up: Dr Arvilla Meres Dr Marca Ancona Dr. Marcos Eke, NP Robbie Lis, [place] [facility] [place], [place] Brynda Peon, NP Karle Plumber, PharmD   Please be sure to bring in all your medications bottles to every appointment.    Thank you for choosing [facility]

## 2022-09-12 ENCOUNTER — Ambulatory Visit: Payer: BC Managed Care – PPO | Attending: Internal Medicine

## 2022-09-12 DIAGNOSIS — I5022 Chronic systolic (congestive) heart failure: Secondary | ICD-10-CM

## 2022-09-12 LAB — ECHOCARDIOGRAM COMPLETE
Area-P 1/2: 4.25 cm2
S' Lateral: 3.9 cm

## 2022-09-29 DIAGNOSIS — I152 Hypertension secondary to endocrine disorders: Secondary | ICD-10-CM

## 2022-09-29 DIAGNOSIS — E119 Type 2 diabetes mellitus without complications: Secondary | ICD-10-CM | POA: Insufficient documentation

## 2022-09-29 DIAGNOSIS — E785 Hyperlipidemia, unspecified: Secondary | ICD-10-CM | POA: Insufficient documentation

## 2022-09-29 DIAGNOSIS — J449 Chronic obstructive pulmonary disease, unspecified: Secondary | ICD-10-CM | POA: Insufficient documentation

## 2022-09-29 DIAGNOSIS — Z720 Tobacco use: Secondary | ICD-10-CM | POA: Insufficient documentation

## 2022-09-29 DIAGNOSIS — E1169 Type 2 diabetes mellitus with other specified complication: Secondary | ICD-10-CM | POA: Insufficient documentation

## 2022-09-29 DIAGNOSIS — E1159 Type 2 diabetes mellitus with other circulatory complications: Secondary | ICD-10-CM | POA: Insufficient documentation

## 2022-09-29 DIAGNOSIS — I10 Essential (primary) hypertension: Secondary | ICD-10-CM | POA: Insufficient documentation

## 2022-09-29 HISTORY — DX: Hypertension secondary to endocrine disorders: I15.2

## 2022-10-09 ENCOUNTER — Telehealth (HOSPITAL_COMMUNITY): Payer: Self-pay | Admitting: Cardiology

## 2022-10-09 DIAGNOSIS — E785 Hyperlipidemia, unspecified: Secondary | ICD-10-CM

## 2022-10-09 NOTE — Telephone Encounter (Signed)
-----   Message from Dolores Patty, MD sent at 10/08/2022  1:37 PM EDT ----- TGs very elevated. He is intolerant of statins. Please refer to Lipid Clinic ASAP due to severe hypertriglyceridemia

## 2022-10-09 NOTE — Telephone Encounter (Signed)
Pt called pt aware Referral placed

## 2022-10-10 NOTE — Progress Notes (Unsigned)
Cardiology Office Note:    Date:  10/11/2022   ID:  Tamera Punt,  [birth year], MRN [id number]  PCP:  [name], MD  Cardiologist:  Norman Herrlich, MD   Referring MD: Dolores Patty, MD COVID-19 positive test (U07.1, COVID-19) with Acute Pneumonia (J12.89, Other viral pneumonia) (If respiratory failure or sepsis present, add as separate assessment)    ASSESSMENT:    1. CAD in native artery   2. Chronic combined systolic and diastolic heart failure (HCC)   3. Hypertensive heart disease with heart failure (HCC)   4. Mixed hyperlipidemia   5. Statin intolerance   6. Type 2 diabetes mellitus with other circulatory complication, with long-term current use of insulin (HCC)    PLAN:    In order of problems listed above:  He is doing well since bypass surgery EF now is mildly reduced heart failure is compensated and he is a good quality of life.  I do not think his chest discomfort is anginal most consistent with postoperative sternal pain and continue his current medical regimen with aspirin lipid-lowering nonstatin Compensated does not require diuretic he is negotiated to go back on SGLT2 inhibitor and Entresto continue the same Blood pressure is somewhat low in my office today have asked him to check his blood pressure daily at home and record I would not take away his Entresto at this time asymptomatic Currently controlled dyslipidemia he declines nonstatin lipid-lowering therapy like bempedoic acid or PCSK9 inhibitor and I asked him to continue his Lopid and add over-the-counter fish oil Check hemoglobin A1c today managed by his PCP insulin and metformin.  Next appointment 6 months   Medication Adjustments/Labs and Tests Ordered: Current medicines are reviewed at length with the patient today.  Concerns regarding medicines are outlined above.  No orders of the defined types were placed in this encounter.  No orders of the defined types were placed in this  encounter.    Chief Complaint  Patient presents with   Follow-up   Congestive Heart Failure   Coronary Artery Disease    History of Present Illness:    Kenneth Bailey is a 57 y.o. male who is being seen today establish cardiology care at the request of Bensimhon, Bevelyn Buckles, MD. other problems include his type 2 diabetes hypertensive heart disease hyperlipidemia previous hyperkalemia peripheral arterial disease and pancreatic lesion on CT scan incidental finding.  He has a history of of CAD and heart failure presenting in January 2023 with decompensated heart failure severely reduced EF 25 to 30% coronary angiography with three-vessel coronary artery disease and underwent CABG 06/10/2021 at the left thoracic artery anastomosed to the LAD vein graft to posterolateral obtuse marginal and ramus arteries.  Subsequent ejection fraction May 2023 40 to 45% on good guideline directed therapy.  Echocardiogram 09/12/2022 shows mildly reduced ejection fraction 45 to 50% with no regional segmental dysfunction grade 1 diastolic filling GLS mildly reduced -17.7 mild right ventricular dysfunction with TAPSE 1.5 and no significant valvular abnormality.  Recent labs with CKD creatinine 1.31 stage II GFR greater than 60 cc and potassium 4.3 lipid profile shows severe elevation in triglycerides 769 HDL 25 direct LDL 81 referred to lipid clinic with statin intolerance.  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 no cardiovascular symptoms of exertional chest pain edema shortness of breath palpitation or syncope At times  he gets brief pain in the sternal scar he does heavy lifting at work he took a nitroglycerin on 1 occasion at work   Past Medical History:  Diagnosis Date   COPD (chronic obstructive pulmonary disease) (HCC)    Diabetes mellitus, type 2 (HCC)    HLD (hyperlipidemia)    Hypertension    Tobacco abuse     Past Surgical History:  Procedure Laterality Date   CORONARY ARTERY BYPASS GRAFT N/A 06/10/2021   Procedure: CORONARY ARTERY BYPASS GRAFTING (CABG) TIMES FOUR, USING LEFT INTERNAL MAMMARY ARTERY AND RIGHT GREATER SAPHENOUS VEIN HARVESTED ENDOSCOPICALLY;  Surgeon: Lovett Sox, MD;  Location: [facility] OR;  Service: Open Heart Surgery;  Laterality: N/A;   ENDOVEIN HARVEST OF GREATER SAPHENOUS VEIN Right 06/10/2021   Procedure: ENDOVEIN HARVEST OF GREATER SAPHENOUS VEIN;  Surgeon: Lovett Sox, MD;  Location: [facility] OR;  Service: Open Heart Surgery;  Laterality: Right;   RIGHT/LEFT HEART CATH AND CORONARY ANGIOGRAPHY N/A 06/06/2021   Procedure: RIGHT/LEFT HEART CATH AND CORONARY ANGIOGRAPHY;  Surgeon: Dolores Patty, MD;  Location: [facility] INVASIVE CV LAB;  Service: Cardiovascular;  Laterality: N/A;   TEE WITHOUT CARDIOVERSION N/A 06/10/2021   Procedure: TRANSESOPHAGEAL ECHOCARDIOGRAM (TEE);  Surgeon: Lovett Sox, MD;  Location: [facility];  Service: Open Heart Surgery;  Laterality: N/A;    Current Medications: Current Meds  Medication Sig   aspirin EC 81 MG tablet Take 1 tablet (81 mg total) by mouth daily. Swallow whole.   dapagliflozin propanediol (FARXIGA) 10 MG TABS tablet Take 1 tablet (10 mg total) by mouth daily before breakfast.   gemfibrozil (LOPID) 600 MG tablet Take 600 mg by mouth 2 (two) times daily before a meal.   Insulin Glargine (BASAGLAR KWIKPEN) 100 UNIT/ML Inject 12-50 Units into the skin See admin instructions. Per sliding scale morning and night   metFORMIN (GLUCOPHAGE) 1000 MG tablet Take 1,000 mg by mouth 2 (two) times daily.   nitroGLYCERIN (NITROSTAT) 0.4 MG SL tablet  Place 1 tablet (0.4 mg total) under the tongue every 5 (five) minutes as needed for chest pain.   sacubitril-valsartan (ENTRESTO) 24-26 MG Take 1 tablet by mouth 2 (two) times daily.     Allergies:   Lisinopril and Statins   Social History   Socioeconomic History   Marital status: Divorced    Spouse name: Not on file   Number of children: Not on file   Years of education: Not on file   Highest education level: Not on file  Occupational History   Not on file  Tobacco Use   Smoking status: Every Day    Types: Cigarettes   Smokeless tobacco: Current  Substance and Sexual Activity   Alcohol use: Not on file   Drug use: Not on file   Sexual activity: Not on file  Other Topics Concern   Not on file  Social History Narrative   Not on file   Social Determinants of Health   Financial Resource Strain: High Risk (01/09/2022)   Overall Financial Resource Strain (CARDIA)    Difficulty of Paying Living Expenses: Hard  Food Insecurity: No Food Insecurity (01/09/2022)   Hunger Vital Sign    Worried About Running Out of Food in the Last Year: Never true  Ran Out of Food in the Last Year: Never true  Transportation Needs: Not on file  Physical Activity: Not on file  Stress: Not on file  Social Connections: Not on file     Family History: The patient's family history includes Cancer in his mother; Heart disease in his father; Hypertension in his mother.  ROS:   ROS Please see the history of present illness.     All other systems reviewed and are negative.  EKGs/Labs/Other Studies Reviewed:    The following studies were reviewed today:   Cardiac Studies & Procedures   CARDIAC CATHETERIZATION  CARDIAC CATHETERIZATION 06/06/2021  Narrative   Ost LAD to Prox LAD lesion is 90% stenosed.   Ramus lesion is 90% stenosed.   Prox RCA lesion is 95% stenosed.   Prox RCA to Mid RCA lesion is 80% stenosed.   Dist RCA lesion is 50% stenosed.   RPDA lesion is 90% stenosed.   Mid LM to  Dist LM lesion is 50% stenosed.   Ost Cx to Prox Cx lesion is 40% stenosed.   1st Mrg lesion is 99% stenosed.   Mid Cx lesion is 50% stenosed.   2nd Diag lesion is 90% stenosed.   Mid LAD lesion is 70% stenosed.   Dist LAD lesion is 50% stenosed.   The left ventricular ejection fraction is 25-35% by visual estimate.  Findings:  Ao = 99/62 (84) LV = 107/11 RA =  5 RV = 30/6 PA = 29/9 (19) PCW = 12 Fick cardiac output/index = 4.7/2.5 PVR = 1.5 WU FA sat = 97% PA sat = 69%, 71% SVC sat = 69%  Assessment: Severe 3v CAD Ischemic CM EF ~25-30% Well compensated filling pressures  Plan/Discussion:  Will need CABG evaluation.  Arvilla Meres, MD 1:11 PM  Findings Coronary Findings Diagnostic  Dominance: Right  Left Main Mid LM to Dist LM lesion is 50% stenosed.  Left Anterior Descending Ost LAD to Prox LAD lesion is 90% stenosed. Mid LAD lesion is 70% stenosed. Dist LAD lesion is 50% stenosed.  Second Diagonal Branch 2nd Diag lesion is 90% stenosed.  Ramus Intermedius Ramus lesion is 90% stenosed.  Left Circumflex Ost Cx to Prox Cx lesion is 40% stenosed. Mid Cx lesion is 50% stenosed.  First Obtuse Marginal Branch 1st Mrg lesion is 99% stenosed.  Right Coronary Artery Prox RCA lesion is 95% stenosed. Prox RCA to Mid RCA lesion is 80% stenosed. Dist RCA lesion is 50% stenosed.  Right Posterior Descending Artery RPDA lesion is 90% stenosed.  Intervention  No interventions have been documented.     ECHOCARDIOGRAM  ECHOCARDIOGRAM COMPLETE 09/12/2022  Narrative ECHOCARDIOGRAM REPORT    Patient Name:   YAVIAN BEDOYA [facility] Date of Exam: 09/12/2022 Medical Rec #:  [id number]           Height:       68.0 in Accession #:    [id number]          Weight:       167.8 lb Date of Birth:  [birth year]            BSA:          1.897 m Patient Age:    56 years            BP:           110/70 mmHg Patient Gender: M                   HR:  92 bpm. Exam  Location:  [place]  Procedure: 2D Echo, Cardiac Doppler, Color Doppler and Strain Analysis  Indications:    Chronic systolic heart failure [I50.22 (ICD-10-CM)]  History:        Patient has prior history of Echocardiogram examinations, most recent 09/21/2021. CHF, CAD, Prior CABG, COPD; Risk Factors:Hypertension, Dyslipidemia and Current Smoker.  Sonographer:    Margreta Journey RDCS Referring Phys: [id number] DANIEL R BENSIMHON  IMPRESSIONS   1. Left ventricular ejection fraction, by estimation, is 45 to 50%. The left ventricle has mildly decreased function. The left ventricle has no regional wall motion abnormalities. Left ventricular diastolic parameters are consistent with Grade I diastolic dysfunction (impaired relaxation). The average left ventricular global longitudinal strain is -17.7 %. The global longitudinal strain is abnormal. 2. TAPSE 1.5. Right ventricular systolic function is mildly reduced. The right ventricular size is normal. 3. The mitral valve is normal in structure. No evidence of mitral valve regurgitation. No evidence of mitral stenosis. 4. The aortic valve is tricuspid. Aortic valve regurgitation is not visualized. No aortic stenosis is present. 5. Aortic Normal DTA. 6. The inferior vena cava is normal in size with greater than 50% respiratory variability, suggesting right atrial pressure of 3 mmHg.  FINDINGS Left Ventricle: Left ventricular ejection fraction, by estimation, is 45 to 50%. The left ventricle has mildly decreased function. The left ventricle has no regional wall motion abnormalities. The average left ventricular global longitudinal strain is -17.7 %. The global longitudinal strain is abnormal. The left ventricular internal cavity size was normal in size. There is borderline left ventricular hypertrophy. Left ventricular diastolic parameters are consistent with Grade I diastolic dysfunction (impaired relaxation). Normal left ventricular filling  pressure.  Right Ventricle: TAPSE 1.5. The right ventricular size is normal. No increase in right ventricular wall thickness. Right ventricular systolic function is mildly reduced.  Left Atrium: Left atrial size was normal in size.  Right Atrium: Right atrial size was normal in size.  Pericardium: There is no evidence of pericardial effusion.  Mitral Valve: The mitral valve is normal in structure. No evidence of mitral valve regurgitation. No evidence of mitral valve stenosis.  Tricuspid Valve: The tricuspid valve is normal in structure. Tricuspid valve regurgitation is not demonstrated. No evidence of tricuspid stenosis.  Aortic Valve: The aortic valve is tricuspid. Aortic valve regurgitation is not visualized. No aortic stenosis is present.  Pulmonic Valve: The pulmonic valve was normal in structure. Pulmonic valve regurgitation is not visualized. No evidence of pulmonic stenosis.  Aorta: The aortic root and ascending aorta are structurally normal, with no evidence of dilitation, Normal DTA and the aortic arch was not well visualized.  Venous: A normal flow pattern is recorded from the right upper pulmonary vein. The inferior vena cava is normal in size with greater than 50% respiratory variability, suggesting right atrial pressure of 3 mmHg.  IAS/Shunts: No atrial level shunt detected by color flow Doppler.   LEFT VENTRICLE PLAX 2D LVIDd:         5.00 cm   Diastology LVIDs:         3.90 cm   LV e' medial:    7.22 cm/s LV PW:         1.10 cm   LV E/e' medial:  9.6 LV IVS:        1.00 cm   LV e' lateral:   10.63 cm/s LVOT diam:     1.70 cm   LV E/e' lateral: 6.5 LVOT Area:  2.27 cm 2D Longitudinal Strain 2D Strain GLS Avg:     -17.7 %  RIGHT VENTRICLE             IVC RV Basal diam:  2.60 cm     IVC diam: 1.10 cm RV Mid diam:    2.40 cm RV S prime:     10.43 cm/s TAPSE (M-mode): 1.5 cm  LEFT ATRIUM             Index        RIGHT ATRIUM           Index LA diam:         2.10 cm 1.11 cm/m   RA Area:     11.60 cm LA Vol (A2C):   48.8 ml 25.73 ml/m  RA Volume:   23.20 ml  12.23 ml/m LA Vol (A4C):   41.7 ml 21.98 ml/m LA Biplane Vol: 46.2 ml 24.36 ml/m  AORTA Ao Root diam: 3.30 cm Ao Asc diam:  3.50 cm Ao Desc diam: 1.90 cm  MITRAL VALVE MV Area (PHT): 4.25 cm     SHUNTS MV Decel Time: 179 msec     Systemic Diam: 1.70 cm MV E velocity: 69.53 cm/s MV A velocity: 111.00 cm/s MV E/A ratio:  0.63  Norman Herrlich MD Electronically signed by Norman Herrlich MD Signature Date/Time: 09/12/2022/8:31:30 PM    Final   TEE  ECHO INTRAOPERATIVE TEE 06/10/2021  Narrative *INTRAOPERATIVE TRANSESOPHAGEAL REPORT *    Patient Name:   DARRILL LAZO [facility] Date of Exam: 06/10/2021 Medical Rec #:  [id number]           Height:       68.0 in Accession #:    [id number]          Weight:       166.0 lb Date of Birth:  01/27/1966            BSA:          1.89 m Patient Age:    55 years            BP:           143/88 mmHg Patient Gender: M                   HR:           72 bpm. Exam Location:  Anesthesiology  Transesophogeal exam was perform intraoperatively during surgical procedure. Patient was closely monitored under general anesthesia during the entirety of examination.  Indications:     Coronary Artery Disease Sonographer:     Eulah Pont RDCS Performing Phys: [id number] PETER VANTRIGT Diagnosing Phys: [name] MD  Complications: No known complications during this procedure. POST-OP IMPRESSIONS _ Left Ventricle: The left ventricle is unchanged from pre-bypass. _ Right Ventricle: The right ventricle appears unchanged from pre-bypass. _ Aorta: The aorta appears unchanged from pre-bypass. _ Left Atrial Appendage: The left atrial appendage appears unchanged from pre-bypass. _ Aortic Valve: The aortic valve appears unchanged from pre-bypass. _ Mitral Valve: The mitral valve appears unchanged from pre-bypass. _ Tricuspid Valve: The tricuspid valve  appears unchanged from pre-bypass. _ Pulmonic Valve: The pulmonic valve appears unchanged from pre-bypass.  PRE-OP FINDINGS Left Ventricle: The left ventricle has severely reduced systolic function, with an ejection fraction of 20-30%. The cavity size was moderately dilated.   Right Ventricle: The right ventricle has normal systolic function. The cavity was normal. There is no increase in right ventricular wall thickness.  Left Atrium: Left atrial size was normal in size. No left atrial/left atrial appendage thrombus was detected.  Right Atrium: Right atrial size was normal in size. Moble, filamentous structure noted arrising from IVC and into Right atrium. Discussed wtih Dr. Maren Beach.  Interatrial Septum: Evidence of atrial level shunting detected by color flow Doppler. A small patent foramen ovale is detected with predominantly left to right shunting across the atrial septum.  Pericardium: There is no evidence of pericardial effusion.  Mitral Valve: The mitral valve is normal in structure. Mitral valve regurgitation is trivial by color flow Doppler. There is no evidence of mitral valve vegetation.  Tricuspid Valve: The tricuspid valve was normal in structure. Tricuspid valve regurgitation is trivial by color flow Doppler.  Aortic Valve: The aortic valve is tricuspid Aortic valve regurgitation was not visualized by color flow Doppler. There is no stenosis of the aortic valve. There is no evidence of aortic valve vegetation.   Pulmonic Valve: The pulmonic valve was normal in structure. Pulmonic valve regurgitation is trivial by color flow Doppler.   Aorta: There is evidence of plaque in the descending aorta; Grade II, measuring 2-65mm in size.  +-------------+--------++ AORTIC VALVE          +-------------+--------++ AV Mean Grad:6.0 mmHg +-------------+--------++   [name] MD Electronically signed by [name] MD Signature Date/Time: 06/10/2021/3:40:03  PM    Final              Recent Labs: 02/27/2022: B Natriuretic Peptide 72.1 09/04/2022: ALT 13; BUN 29; Creatinine, Ser 1.31; Potassium 4.3; Sodium 134  Recent Lipid Panel    Component Value Date/Time   CHOL 205 (H) 09/04/2022 1546   TRIG 769 (H) 09/04/2022 1546   HDL 25 (L) 09/04/2022 1546   CHOLHDL 8.2 09/04/2022 1546   VLDL UNABLE TO CALCULATE IF TRIGLYCERIDE OVER 400 mg/dL 08/65/7846 9629   LDLCALC UNABLE TO CALCULATE IF TRIGLYCERIDE OVER 400 mg/dL 52/84/1324 4010   LDLDIRECT 81 09/04/2022 1546    Physical Exam:    VS:  BP 90/70 (BP Location: Right Arm, Patient Position: Sitting, Cuff Size: Normal)   Pulse 84   Ht 5\' 8"  (1.727 m)   Wt 173 lb (78.5 kg)   SpO2 95%   BMI 26.30 kg/m     Wt Readings from Last 3 Encounters:  10/11/22 173 lb (78.5 kg)  09/04/22 167 lb 12.8 oz (76.1 kg)  03/20/22 172 lb 6.4 oz (78.2 kg)     GEN:  Well nourished, well developed in no acute distress HEENT: Normal NECK: No JVD; No carotid bruits LYMPHATICS: No lymphadenopathy CARDIAC: RRR, no murmurs, rubs, gallops he has no chest wall tenderness RESPIRATORY:  Clear to auscultation without rales, wheezing or rhonchi  ABDOMEN: Soft, non-tender, non-distended MUSCULOSKELETAL:  No edema; No deformity  SKIN: Warm and dry NEUROLOGIC:  Alert and oriented x 3 PSYCHIATRIC:  Normal affect     Signed, Norman Herrlich, MD  10/11/2022 4:02 PM    [facility]

## 2022-10-11 ENCOUNTER — Encounter: Payer: Self-pay | Admitting: Cardiology

## 2022-10-11 ENCOUNTER — Ambulatory Visit: Payer: BC Managed Care – PPO | Attending: Cardiology | Admitting: Cardiology

## 2022-10-11 VITALS — BP 90/70 | HR 84 | Ht 68.0 in | Wt 173.0 lb

## 2022-10-11 DIAGNOSIS — E782 Mixed hyperlipidemia: Secondary | ICD-10-CM

## 2022-10-11 DIAGNOSIS — I5042 Chronic combined systolic (congestive) and diastolic (congestive) heart failure: Secondary | ICD-10-CM | POA: Diagnosis not present

## 2022-10-11 DIAGNOSIS — I110 Hypertensive heart disease with heart failure: Secondary | ICD-10-CM

## 2022-10-11 DIAGNOSIS — Z789 Other specified health status: Secondary | ICD-10-CM

## 2022-10-11 DIAGNOSIS — I2510 Atherosclerotic heart disease of native coronary artery without angina pectoris: Secondary | ICD-10-CM

## 2022-10-11 DIAGNOSIS — E1159 Type 2 diabetes mellitus with other circulatory complications: Secondary | ICD-10-CM

## 2022-10-11 DIAGNOSIS — Z794 Long term (current) use of insulin: Secondary | ICD-10-CM

## 2022-10-11 NOTE — Patient Instructions (Signed)
Medication Instructions:  Your physician recommends that you continue on your current medications as directed. Please refer to the Current Medication list given to you today.  *If you need a refill on your cardiac medications before your next appointment, please call your pharmacy*   Lab Work: Your physician recommends that you return for lab work in:   Labs today: Hbg A1c  If you have labs (blood work) drawn today and your tests are completely normal, you will receive your results only by: MyChart Message (if you have MyChart) OR A paper copy in the mail If you have any lab test that is abnormal or we need to change your treatment, we will call you to review the results.   Testing/Procedures: None   Follow-Up: At [facility], you and your health needs are our priority.  As part of our continuing mission to provide you with exceptional heart care, we have created designated Provider Care Teams.  These Care Teams include your primary Cardiologist (physician) and Advanced Practice Providers (APPs -  Physician Assistants and Nurse Practitioners) who all work together to provide you with the care you need, when you need it.  We recommend signing up for the patient portal called "MyChart".  Sign up information is provided on this After Visit Summary.  MyChart is used to connect with patients for Virtual Visits (Telemedicine).  Patients are able to view lab/test results, encounter notes, upcoming appointments, etc.  Non-urgent messages can be sent to your provider as well.   To learn more about what you can do with MyChart, go to [email or web address].    Your next appointment:   6 month(s)  Provider:   Norman Herrlich, MD    Other Instructions Fish Oil capsules 2 per day.  Check home blood pressures daily and record.

## 2022-10-12 ENCOUNTER — Telehealth: Payer: Self-pay

## 2022-10-12 LAB — HEMOGLOBIN A1C
Est. average glucose Bld gHb Est-mCnc: 260 mg/dL
Hgb A1c MFr Bld: 10.7 % — ABNORMAL HIGH (ref 4.8–5.6)

## 2022-10-12 NOTE — Telephone Encounter (Signed)
Patient notified of results and recommendations and agreed with plan. Results sent to PCP.

## 2022-10-12 NOTE — Telephone Encounter (Signed)
-----   Message from Baldo Daub, MD sent at 10/12/2022  7:47 AM EDT ----- His A1c is very elevated at 10.7, he should meet with his PCP and tighten his diabetic control

## 2022-10-25 ENCOUNTER — Ambulatory Visit (INDEPENDENT_AMBULATORY_CARE_PROVIDER_SITE_OTHER): Payer: BC Managed Care – PPO | Admitting: Podiatry

## 2022-10-25 DIAGNOSIS — L03116 Cellulitis of left lower limb: Secondary | ICD-10-CM

## 2022-10-25 DIAGNOSIS — L97522 Non-pressure chronic ulcer of other part of left foot with fat layer exposed: Secondary | ICD-10-CM

## 2022-10-25 DIAGNOSIS — L97529 Non-pressure chronic ulcer of other part of left foot with unspecified severity: Secondary | ICD-10-CM

## 2022-10-25 DIAGNOSIS — E11621 Type 2 diabetes mellitus with foot ulcer: Secondary | ICD-10-CM

## 2022-10-26 NOTE — Progress Notes (Signed)
Chief Complaint  Patient presents with   Foot Ulcer    Rm 3 Left foot ulcer x 3 weeks. Pt states he was seen by another doctor, was given 2 different antibiotics and xrays done. Per pt the Dr did not give him care insttructions or advised his to stay off of his feet so while he was at work her popped the ulcer. No has a small hole with some drainage. Bandage that was taken off today in office has been on since last night.     HPI: 57 y.o. male presenting today with concern of a wound on the bottom of his left foot.  It appears that he was seeing a doctor outside of the [facility] network since there are no notes to review.  States that this external physician had provided 2 different antibiotics for him to take and x-rays performed.  The patient states that he had not been given any instructions on how to take care of this at home, therefore he has not been applying a dressing and has been continuing to work.  He states that he felt a pop at work and noticed the area opened up and drained.  He has a Band-Aid on today that has drainage on it.  Denies fever chills night sweats nausea vomiting.  He does not know the names of the antibiotics that he is taking.  He does not have them with him for review today.  His most recent HbA1c obtained on 10/11/2022 was 10.7 which was an increase from his previous A1c of 8%. he does smoke cigarettes.  Past Medical History:  Diagnosis Date   COPD (chronic obstructive pulmonary disease) (HCC)    Diabetes mellitus, type 2 (HCC)    HLD (hyperlipidemia)    Hypertension    Tobacco abuse     Past Surgical History:  Procedure Laterality Date   CORONARY ARTERY BYPASS GRAFT N/A 06/10/2021   Procedure: CORONARY ARTERY BYPASS GRAFTING (CABG) TIMES FOUR, USING LEFT INTERNAL MAMMARY ARTERY AND RIGHT GREATER SAPHENOUS VEIN HARVESTED ENDOSCOPICALLY;  Surgeon: Lovett Sox, MD;  Location: [facility] OR;  Service: Open Heart Surgery;  Laterality: N/A;   ENDOVEIN HARVEST OF GREATER  SAPHENOUS VEIN Right 06/10/2021   Procedure: ENDOVEIN HARVEST OF GREATER SAPHENOUS VEIN;  Surgeon: Lovett Sox, MD;  Location: [facility] OR;  Service: Open Heart Surgery;  Laterality: Right;   RIGHT/LEFT HEART CATH AND CORONARY ANGIOGRAPHY N/A 06/06/2021   Procedure: RIGHT/LEFT HEART CATH AND CORONARY ANGIOGRAPHY;  Surgeon: Dolores Patty, MD;  Location: [facility] INVASIVE CV LAB;  Service: Cardiovascular;  Laterality: N/A;   TEE WITHOUT CARDIOVERSION N/A 06/10/2021   Procedure: TRANSESOPHAGEAL ECHOCARDIOGRAM (TEE);  Surgeon: Lovett Sox, MD;  Location: [facility] OR;  Service: Open Heart Surgery;  Laterality: N/A;    Allergies  Allergen Reactions   Lisinopril     Leg swelling   Statins     Leg swelling   Review of Systems  Skin:        Ulcer left foot     PHYSICAL EXAM:  General: The patient is alert and oriented x3 in no acute distress.  Dermatology: Skin is warm, dry and supple bilateral lower extremities. Interspaces are clear of maceration and debris.      Wound 1 description:  Location: Submet 5 left foot.    Depth: To capsule    Wound Border: Hyperkeratotic without erythema    Odor?:  No    Surrounding Tissue: No erythema noted.  Mild edema noted  no calor    Infected?:  Yes, small amount of purulence expressed    Necrosis?:  No    Pain?:  Mild pain to the area.    Tunneling: Tender to deep to the plantar capsule     Dimensions (cm): 0.4 cm x 0.4 cm x 1.0 cm  Vascular: Pedal pulses are diminished but palpable  Neurological: Light touch sensation diminished bilateral.  Protective sensation diminished bilateral forefoot.  Musculoskeletal Exam: Plantarflexed fifth metatarsal left foot.  Adductovarus left fifth toe.     Latest Ref Rng & Units 10/11/2022    4:15 PM  Hemoglobin A1C  Hemoglobin-A1c 4.8 - 5.6 % 10.7    ASSESSMENT / PLAN OF CARE: 1. Type 2 diabetes mellitus with foot ulcer (CODE) (HCC)   2. Ulcer of left foot, unspecified ulcer stage (HCC)     Culture swabs of  the wound(s) were obtained today for a aerobic and anaerobic Gram stain with culture and sensitivity.  The ulceration was sharply debrided of hyperkeratotic and devitalized soft tissue with sterile #312 blade to the level of subcutaneous tissue.  Hemostasis obtained.  Iodosorb ointment and DSD applied.  Reviewed off-loading with patient.  Surgical shoe dispensed and modified to offload the fifth metatarsal head for patient to wear at all times WB.  He will speak to his employer as to whether he is allowed to work with the surgical shoe on since he wants to continue working.  However if not permitted he will need a note to be out of work for a prolonged period of time to allow for healing.  Reviewed daily dressing changes with patient.  Will send for wound care supplies to be sent to the patient's home through [employer].  These were ordered today.  At patient's next appointment, if he does not bring his x-rays on disc for review will need to obtain new x-rays of the left foot to evaluate any bone involvement at this time since the ulceration is deep.  He was advised to continue with the current antibiotics even though we are not sure what he is currently taking until the culture comes back.  May need to start him on new antibiotic therapy.  He does feel that the foot has improved somewhat since taking the current antibiotics.    Patient was advised that he cannot leave this wound open and not protected or covered by gauze.  He is instructed to not use Band-Aids.  Patient will need counseled on the importance of getting his HbA1c level to a better value in order to help his healing potential.  This clearly is complicating matters.  Discussed risks / concerns regarding ulcer with patient and possible sequelae if left untreated.  Stressed importance of infection prevention at home. Short-term goals are:  resolve infection, off-load ulcer, heal ulcer Long-term goals are:  prevent recurrence, prevent  amputation.   Return in about 1 week (around 11/01/2022) for ulcer recheck and x-ray.  review culture results.  And begin appropriate antibiotic therapy.  Due to his work schedule he will not be able to follow-up with me, therefore will place on Dr. Higinio Plan schedule for follow-up   Saryn Cherry Orland Mustard, DPM, FACFAS [facility]     [address]                                        [place], [place]  [address]                Office [phone]  Fax [phone]

## 2022-10-27 LAB — WOUND CULTURE: SPECIMEN QUALITY:: ADEQUATE

## 2022-10-28 LAB — WOUND CULTURE: MICRO NUMBER:: 15044652

## 2022-10-30 ENCOUNTER — Ambulatory Visit: Payer: BC Managed Care – PPO | Admitting: Podiatry

## 2022-10-30 ENCOUNTER — Inpatient Hospital Stay (HOSPITAL_COMMUNITY)
Admission: EM | Admit: 2022-10-30 | Discharge: 2022-11-17 | DRG: 853 | Disposition: A | Discharge status: Home / Self Care | Payer: BC Managed Care – PPO | Attending: Internal Medicine | Admitting: Internal Medicine

## 2022-10-30 ENCOUNTER — Emergency Department (HOSPITAL_COMMUNITY): Payer: BC Managed Care – PPO

## 2022-10-30 ENCOUNTER — Ambulatory Visit (INDEPENDENT_AMBULATORY_CARE_PROVIDER_SITE_OTHER): Payer: BC Managed Care – PPO

## 2022-10-30 ENCOUNTER — Encounter (HOSPITAL_COMMUNITY): Payer: Self-pay

## 2022-10-30 DIAGNOSIS — M86172 Other acute osteomyelitis, left ankle and foot: Secondary | ICD-10-CM | POA: Diagnosis present

## 2022-10-30 DIAGNOSIS — L02612 Cutaneous abscess of left foot: Secondary | ICD-10-CM | POA: Diagnosis present

## 2022-10-30 DIAGNOSIS — A419 Sepsis, unspecified organism: Secondary | ICD-10-CM | POA: Diagnosis present

## 2022-10-30 DIAGNOSIS — F17210 Nicotine dependence, cigarettes, uncomplicated: Secondary | ICD-10-CM | POA: Diagnosis not present

## 2022-10-30 DIAGNOSIS — E1152 Type 2 diabetes mellitus with diabetic peripheral angiopathy with gangrene: Secondary | ICD-10-CM | POA: Diagnosis present

## 2022-10-30 DIAGNOSIS — Z716 Tobacco abuse counseling: Secondary | ICD-10-CM

## 2022-10-30 DIAGNOSIS — D75839 Thrombocytosis, unspecified: Secondary | ICD-10-CM | POA: Diagnosis not present

## 2022-10-30 DIAGNOSIS — I70262 Atherosclerosis of native arteries of extremities with gangrene, left leg: Secondary | ICD-10-CM | POA: Diagnosis present

## 2022-10-30 DIAGNOSIS — R112 Nausea with vomiting, unspecified: Secondary | ICD-10-CM | POA: Diagnosis present

## 2022-10-30 DIAGNOSIS — I5042 Chronic combined systolic (congestive) and diastolic (congestive) heart failure: Secondary | ICD-10-CM | POA: Diagnosis present

## 2022-10-30 DIAGNOSIS — M869 Osteomyelitis, unspecified: Secondary | ICD-10-CM | POA: Diagnosis present

## 2022-10-30 DIAGNOSIS — E11621 Type 2 diabetes mellitus with foot ulcer: Secondary | ICD-10-CM

## 2022-10-30 DIAGNOSIS — T148XXD Other injury of unspecified body region, subsequent encounter: Secondary | ICD-10-CM | POA: Diagnosis not present

## 2022-10-30 DIAGNOSIS — K260 Acute duodenal ulcer with hemorrhage: Secondary | ICD-10-CM | POA: Diagnosis not present

## 2022-10-30 DIAGNOSIS — E1165 Type 2 diabetes mellitus with hyperglycemia: Secondary | ICD-10-CM | POA: Diagnosis present

## 2022-10-30 DIAGNOSIS — Z888 Allergy status to other drugs, medicaments and biological substances status: Secondary | ICD-10-CM

## 2022-10-30 DIAGNOSIS — K208 Other esophagitis without bleeding: Secondary | ICD-10-CM

## 2022-10-30 DIAGNOSIS — I70245 Atherosclerosis of native arteries of left leg with ulceration of other part of foot: Secondary | ICD-10-CM | POA: Diagnosis not present

## 2022-10-30 DIAGNOSIS — K264 Chronic or unspecified duodenal ulcer with hemorrhage: Secondary | ICD-10-CM | POA: Diagnosis not present

## 2022-10-30 DIAGNOSIS — I2510 Atherosclerotic heart disease of native coronary artery without angina pectoris: Secondary | ICD-10-CM | POA: Diagnosis present

## 2022-10-30 DIAGNOSIS — K269 Duodenal ulcer, unspecified as acute or chronic, without hemorrhage or perforation: Secondary | ICD-10-CM

## 2022-10-30 DIAGNOSIS — K259 Gastric ulcer, unspecified as acute or chronic, without hemorrhage or perforation: Secondary | ICD-10-CM | POA: Diagnosis present

## 2022-10-30 DIAGNOSIS — K209 Esophagitis, unspecified without bleeding: Secondary | ICD-10-CM | POA: Diagnosis not present

## 2022-10-30 DIAGNOSIS — D62 Acute posthemorrhagic anemia: Secondary | ICD-10-CM | POA: Diagnosis not present

## 2022-10-30 DIAGNOSIS — E872 Acidosis, unspecified: Secondary | ICD-10-CM | POA: Diagnosis present

## 2022-10-30 DIAGNOSIS — M86672 Other chronic osteomyelitis, left ankle and foot: Secondary | ICD-10-CM | POA: Diagnosis not present

## 2022-10-30 DIAGNOSIS — K253 Acute gastric ulcer without hemorrhage or perforation: Secondary | ICD-10-CM | POA: Diagnosis not present

## 2022-10-30 DIAGNOSIS — L03116 Cellulitis of left lower limb: Secondary | ICD-10-CM

## 2022-10-30 DIAGNOSIS — L97529 Non-pressure chronic ulcer of other part of left foot with unspecified severity: Secondary | ICD-10-CM | POA: Diagnosis present

## 2022-10-30 DIAGNOSIS — K920 Hematemesis: Secondary | ICD-10-CM | POA: Diagnosis not present

## 2022-10-30 DIAGNOSIS — Z794 Long term (current) use of insulin: Secondary | ICD-10-CM | POA: Diagnosis not present

## 2022-10-30 DIAGNOSIS — J449 Chronic obstructive pulmonary disease, unspecified: Secondary | ICD-10-CM | POA: Diagnosis present

## 2022-10-30 DIAGNOSIS — I110 Hypertensive heart disease with heart failure: Secondary | ICD-10-CM | POA: Diagnosis present

## 2022-10-30 DIAGNOSIS — D649 Anemia, unspecified: Secondary | ICD-10-CM | POA: Diagnosis not present

## 2022-10-30 DIAGNOSIS — E1169 Type 2 diabetes mellitus with other specified complication: Secondary | ICD-10-CM | POA: Diagnosis present

## 2022-10-30 DIAGNOSIS — Z7984 Long term (current) use of oral hypoglycemic drugs: Secondary | ICD-10-CM

## 2022-10-30 DIAGNOSIS — E871 Hypo-osmolality and hyponatremia: Secondary | ICD-10-CM | POA: Diagnosis present

## 2022-10-30 DIAGNOSIS — Z7982 Long term (current) use of aspirin: Secondary | ICD-10-CM

## 2022-10-30 DIAGNOSIS — Z8249 Family history of ischemic heart disease and other diseases of the circulatory system: Secondary | ICD-10-CM

## 2022-10-30 DIAGNOSIS — R55 Syncope and collapse: Secondary | ICD-10-CM | POA: Diagnosis not present

## 2022-10-30 DIAGNOSIS — Z79899 Other long term (current) drug therapy: Secondary | ICD-10-CM

## 2022-10-30 DIAGNOSIS — N179 Acute kidney failure, unspecified: Secondary | ICD-10-CM | POA: Diagnosis present

## 2022-10-30 DIAGNOSIS — K921 Melena: Secondary | ICD-10-CM | POA: Diagnosis not present

## 2022-10-30 DIAGNOSIS — E785 Hyperlipidemia, unspecified: Secondary | ICD-10-CM | POA: Diagnosis present

## 2022-10-30 DIAGNOSIS — Z5986 Financial insecurity: Secondary | ICD-10-CM

## 2022-10-30 DIAGNOSIS — K922 Gastrointestinal hemorrhage, unspecified: Secondary | ICD-10-CM | POA: Diagnosis not present

## 2022-10-30 DIAGNOSIS — E1142 Type 2 diabetes mellitus with diabetic polyneuropathy: Secondary | ICD-10-CM | POA: Diagnosis present

## 2022-10-30 DIAGNOSIS — E8809 Other disorders of plasma-protein metabolism, not elsewhere classified: Secondary | ICD-10-CM | POA: Diagnosis present

## 2022-10-30 DIAGNOSIS — L97509 Non-pressure chronic ulcer of other part of unspecified foot with unspecified severity: Secondary | ICD-10-CM | POA: Diagnosis not present

## 2022-10-30 DIAGNOSIS — K2100 Gastro-esophageal reflux disease with esophagitis, without bleeding: Secondary | ICD-10-CM | POA: Diagnosis present

## 2022-10-30 DIAGNOSIS — K5909 Other constipation: Secondary | ICD-10-CM | POA: Diagnosis not present

## 2022-10-30 DIAGNOSIS — Z951 Presence of aortocoronary bypass graft: Secondary | ICD-10-CM

## 2022-10-30 DIAGNOSIS — A4902 Methicillin resistant Staphylococcus aureus infection, unspecified site: Secondary | ICD-10-CM | POA: Diagnosis not present

## 2022-10-30 DIAGNOSIS — B9562 Methicillin resistant Staphylococcus aureus infection as the cause of diseases classified elsewhere: Secondary | ICD-10-CM | POA: Diagnosis present

## 2022-10-30 DIAGNOSIS — E875 Hyperkalemia: Secondary | ICD-10-CM | POA: Diagnosis present

## 2022-10-30 DIAGNOSIS — E860 Dehydration: Secondary | ICD-10-CM | POA: Diagnosis present

## 2022-10-30 LAB — CBC
HCT: 43 % (ref 39.0–52.0)
Hemoglobin: 13.8 g/dL (ref 13.0–17.0)
MCH: 26.8 pg (ref 26.0–34.0)
MCHC: 32.1 g/dL (ref 30.0–36.0)
MCV: 83.7 fL (ref 80.0–100.0)
Platelets: 402 10*3/uL — ABNORMAL HIGH (ref 150–400)
RBC: 5.14 MIL/uL (ref 4.22–5.81)
RDW: 14.1 % (ref 11.5–15.5)
WBC: 21.2 10*3/uL — ABNORMAL HIGH (ref 4.0–10.5)
nRBC: 0 % (ref 0.0–0.2)

## 2022-10-30 LAB — BASIC METABOLIC PANEL
Anion gap: 19 — ABNORMAL HIGH (ref 5–15)
BUN: 48 mg/dL — ABNORMAL HIGH (ref 6–20)
CO2: 21 mmol/L — ABNORMAL LOW (ref 22–32)
Calcium: 10.2 mg/dL (ref 8.9–10.3)
Chloride: 95 mmol/L — ABNORMAL LOW (ref 98–111)
Creatinine, Ser: 2.91 mg/dL — ABNORMAL HIGH (ref 0.61–1.24)
GFR, Estimated: 25 mL/min — ABNORMAL LOW (ref >60)
Glucose, Bld: 183 mg/dL — ABNORMAL HIGH (ref 70–99)
Potassium: 4.7 mmol/L (ref 3.5–5.1)
Sodium: 135 mmol/L (ref 135–145)

## 2022-10-30 LAB — LACTIC ACID, PLASMA
Lactic Acid, Venous: 2.4 mmol/L (ref 0.5–1.9)
Lactic Acid, Venous: 1.2 mmol/L (ref 0.5–1.9)

## 2022-10-30 MED ORDER — LACTATED RINGERS IV BOLUS
1000.0000 mL | Freq: Once | INTRAVENOUS | Status: AC
  Administered 2022-10-30: 1000 mL via INTRAVENOUS

## 2022-10-30 MED ORDER — HYDROMORPHONE HCL 1 MG/ML IJ SOLN
0.5000 mg | INTRAMUSCULAR | Status: AC | PRN
  Administered 2022-10-31 – 2022-11-01 (×4): 0.5 mg via INTRAVENOUS
  Filled 2022-10-30 (×4): qty 0.5

## 2022-10-30 MED ORDER — GADOBUTROL 1 MMOL/ML IV SOLN
7.0000 mL | Freq: Once | INTRAVENOUS | Status: AC | PRN
  Administered 2022-10-30: 7 mL via INTRAVENOUS

## 2022-10-30 MED ORDER — ACETAMINOPHEN 325 MG PO TABS
650.0000 mg | ORAL_TABLET | Freq: Four times a day (QID) | ORAL | Status: DC | PRN
  Administered 2022-11-01 – 2022-11-17 (×7): 650 mg via ORAL
  Filled 2022-10-30 (×8): qty 2

## 2022-10-30 MED ORDER — OXYCODONE HCL 5 MG PO TABS
5.0000 mg | ORAL_TABLET | Freq: Four times a day (QID) | ORAL | Status: DC | PRN
  Administered 2022-10-31 – 2022-11-17 (×46): 5 mg via ORAL
  Filled 2022-10-30 (×51): qty 1

## 2022-10-30 MED ORDER — VANCOMYCIN VARIABLE DOSE PER UNSTABLE RENAL FUNCTION (PHARMACIST DOSING): Status: DC

## 2022-10-30 MED ORDER — INSULIN ASPART 100 UNIT/ML IJ SOLN
0.0000 [IU] | Freq: Three times a day (TID) | INTRAMUSCULAR | Status: DC
  Administered 2022-10-31 (×2): 1 [IU] via SUBCUTANEOUS
  Administered 2022-11-01: 3 [IU] via SUBCUTANEOUS
  Administered 2022-11-01: 2 [IU] via SUBCUTANEOUS
  Administered 2022-11-01: 3 [IU] via SUBCUTANEOUS
  Administered 2022-11-02: 1 [IU] via SUBCUTANEOUS
  Administered 2022-11-02: 2 [IU] via SUBCUTANEOUS
  Administered 2022-11-02: 5 [IU] via SUBCUTANEOUS
  Administered 2022-11-03: 2 [IU] via SUBCUTANEOUS
  Administered 2022-11-03: 3 [IU] via SUBCUTANEOUS
  Administered 2022-11-04: 7 [IU] via SUBCUTANEOUS
  Administered 2022-11-04: 3 [IU] via SUBCUTANEOUS
  Administered 2022-11-04: 5 [IU] via SUBCUTANEOUS
  Administered 2022-11-05: 2 [IU] via SUBCUTANEOUS
  Administered 2022-11-05 – 2022-11-06 (×4): 3 [IU] via SUBCUTANEOUS
  Administered 2022-11-07: 2 [IU] via SUBCUTANEOUS
  Administered 2022-11-07: 7 [IU] via SUBCUTANEOUS
  Administered 2022-11-07: 2 [IU] via SUBCUTANEOUS
  Administered 2022-11-08: 5 [IU] via SUBCUTANEOUS
  Administered 2022-11-08: 1 [IU] via SUBCUTANEOUS
  Administered 2022-11-09: 7 [IU] via SUBCUTANEOUS
  Administered 2022-11-09: 5 [IU] via SUBCUTANEOUS
  Administered 2022-11-09: 3 [IU] via SUBCUTANEOUS
  Administered 2022-11-10: 2 [IU] via SUBCUTANEOUS
  Administered 2022-11-10: 5 [IU] via SUBCUTANEOUS
  Administered 2022-11-10: 2 [IU] via SUBCUTANEOUS
  Administered 2022-11-11: 7 [IU] via SUBCUTANEOUS
  Administered 2022-11-11: 2 [IU] via SUBCUTANEOUS
  Administered 2022-11-11 – 2022-11-12 (×2): 5 [IU] via SUBCUTANEOUS
  Administered 2022-11-12: 3 [IU] via SUBCUTANEOUS
  Administered 2022-11-12: 2 [IU] via SUBCUTANEOUS
  Administered 2022-11-13: 3 [IU] via SUBCUTANEOUS

## 2022-10-30 MED ORDER — NICOTINE 14 MG/24HR TD PT24
14.0000 mg | MEDICATED_PATCH | Freq: Every day | TRANSDERMAL | Status: DC
  Administered 2022-10-31 – 2022-11-17 (×18): 14 mg via TRANSDERMAL
  Filled 2022-10-30 (×18): qty 1

## 2022-10-30 MED ORDER — VANCOMYCIN HCL 1500 MG/300ML IV SOLN
1500.0000 mg | Freq: Once | INTRAVENOUS | Status: AC
  Administered 2022-10-30: 1500 mg via INTRAVENOUS
  Filled 2022-10-30: qty 300

## 2022-10-30 MED ORDER — HYDROCODONE-ACETAMINOPHEN 5-325 MG PO TABS
1.0000 | ORAL_TABLET | Freq: Once | ORAL | Status: AC
  Administered 2022-10-30: 1 via ORAL
  Filled 2022-10-30: qty 1

## 2022-10-30 MED ORDER — INSULIN ASPART 100 UNIT/ML IJ SOLN
0.0000 [IU] | Freq: Every day | INTRAMUSCULAR | Status: DC
  Administered 2022-11-05 – 2022-11-06 (×2): 3 [IU] via SUBCUTANEOUS
  Administered 2022-11-07: 4 [IU] via SUBCUTANEOUS
  Administered 2022-11-08 – 2022-11-09 (×2): 3 [IU] via SUBCUTANEOUS
  Administered 2022-11-10: 2 [IU] via SUBCUTANEOUS

## 2022-10-30 MED ORDER — POLYETHYLENE GLYCOL 3350 17 G PO PACK
17.0000 g | PACK | Freq: Every day | ORAL | Status: DC | PRN
  Administered 2022-11-12 – 2022-11-16 (×2): 17 g via ORAL
  Filled 2022-10-30 (×2): qty 1

## 2022-10-30 NOTE — ED Triage Notes (Signed)
Pt arrives today after seeing his podiatrist. Pt states that he has had a wound that has been getting progressively worse. Pt sent for MRI/IV abx.

## 2022-10-30 NOTE — ED Notes (Signed)
ED TO INPATIENT HANDOFF REPORT  ED Nurse Name and Phone #: Rodney Booze [phone]  S Name/Age/Gender Kenneth Bailey 57 y.o. male Room/Bed: 011C/011C  Code Status   Code Status: Full Code  Home/SNF/Other Home Patient oriented to: self, place, time, and situation Is this baseline? Yes   Triage Complete: Triage complete  Chief Complaint Osteomyelitis [facility]) [M86.9]  Triage Note Pt arrives today after seeing his podiatrist. Pt states that he has had a wound that has been getting progressively worse. Pt sent for MRI/IV abx.   Allergies Allergies  Allergen Reactions   Lisinopril     Leg swelling   Statins     Leg swelling    Level of Care/Admitting Diagnosis ED Disposition     ED Disposition  Admit   Condition  --   Comment  Hospital Area: [facility] [[id number]]  Level of Care: Telemetry Surgical [105]  May admit patient to [name] or [facility] if equivalent level of care is available:: No  Covid Evaluation: Asymptomatic - no recent exposure (last 10 days) testing not required  Diagnosis: Osteomyelitis [facility]) [[id number]]  Admitting Physician: Darlin Drop [[id number]]  Attending Physician: Darlin Drop [[id number]]  Certification:: I certify this patient will need inpatient services for at least 2 midnights  Estimated Length of Stay: 2          B Medical/Surgery History Past Medical History:  Diagnosis Date   COPD (chronic obstructive pulmonary disease) (HCC)    Diabetes mellitus, type 2 (HCC)    HLD (hyperlipidemia)    Hypertension    Tobacco abuse    Past Surgical History:  Procedure Laterality Date   CORONARY ARTERY BYPASS GRAFT N/A 06/10/2021   Procedure: CORONARY ARTERY BYPASS GRAFTING (CABG) TIMES FOUR, USING LEFT INTERNAL MAMMARY ARTERY AND RIGHT GREATER SAPHENOUS VEIN HARVESTED ENDOSCOPICALLY;  Surgeon: Lovett Sox, MD;  [facility];  Service: Open Heart Surgery;  Laterality: N/A;   ENDOVEIN HARVEST OF GREATER SAPHENOUS VEIN  Right 06/10/2021   Procedure: ENDOVEIN HARVEST OF GREATER SAPHENOUS VEIN;  Surgeon: Lovett Sox, MD;  [facility] OR;  Service: Open Heart Surgery;  Laterality: Right;   RIGHT/LEFT HEART CATH AND CORONARY ANGIOGRAPHY N/A 06/06/2021   Procedure: RIGHT/LEFT HEART CATH AND CORONARY ANGIOGRAPHY;  Surgeon: Dolores Patty, MD;  [facility] INVAS[facility] CV LAB;  Service: Cardiovascular;  Laterality: N/A;   TEE WITHOUT CARDIOVERSION N/A 06/10/2021   Procedure: TRANSESOPHAGEAL ECHOCARDIOGRAM (TEE);  Surgeon: Lovett Sox, MD;  Location: [facility] OR;  Service: Open Heart Surgery;  Laterality: N/A;     A IV Location/Drains/Wounds Patient Lines/Drains/Airways Status     Active Line/Drains/Airways     Name Placement date Placement time Site Days   Peripheral IV 10/30/22 20 G Anterior;Distal;Left;Upper Arm 10/30/22  1700  Arm  less than 1   Peripheral IV 10/30/22 20 G Anterior;Distal;Right;Upper Arm 10/30/22  1702  Arm  less than 1            Intake/Output Last 24 hours  Intake/Output Summary (Last 24 hours) at 10/30/2022 2229 Last data filed at 10/30/2022 2058 Gross per 24 hour  Intake 1295.11 ml  Output --  Net 1295.11 ml    Labs/Imaging Results for orders placed or performed during the hospital encounter of 10/30/22 (from the past 48 hour(s))  CBC     Status: Abnormal   Collection Time: 10/30/22  4:58 PM  Result Value Ref Range   WBC 21.2 (H) 4.0 - 10.5 K/uL   RBC 5.14 4.22 -  5.81 MIL/uL   Hemoglobin 13.8 13.0 - 17.0 g/dL   HCT 16.1 09.6 - 04.5 %   MCV 83.7 80.0 - 100.0 fL   MCH 26.8 26.0 - 34.0 pg   MCHC 32.1 30.0 - 36.0 g/dL   RDW 40.9 81.1 - 91.4 %   Platelets 402 (H) 150 - 400 K/uL   nRBC 0.0 0.0 - 0.2 %    Comment: Performed at [facility], [address], [place], [place] [address]  Basic metabolic panel     Status: Abnormal   Collection Time: 10/30/22  4:58 PM  Result Value Ref Range   Sodium 135 135 - 145 mmol/L   Potassium 4.7 3.5 - 5.1 mmol/L   Chloride 95  (L) 98 - 111 mmol/L   CO2 21 (L) 22 - 32 mmol/L   Glucose, Bld 183 (H) 70 - 99 mg/dL    Comment: Glucose reference range applies only to samples taken after fasting for at least 8 hours.   BUN 48 (H) 6 - 20 mg/dL   Creatinine, Ser 6.21 (H) 0.61 - 1.24 mg/dL   Calcium 30.8 8.9 - 65.7 mg/dL   GFR, Estimated 25 (L) >60 mL/min    Comment: (NOTE) Calculated using the CKD-EPI Creatinine Equation (2021)    Anion gap 19 (H) 5 - 15    Comment: Performed at [facility], [address], [place], [place] [address]  Lactic acid, plasma     Status: Abnormal   Collection Time: 10/30/22  4:58 PM  Result Value Ref Range   Lactic Acid, Venous 2.4 (HH) 0.5 - 1.9 mmol/L    Comment: CRITICAL RESULT CALLED TO, READ BACK BY AND VERIFIED WITH E KOEPPLINGER,RN 1850 10/30/2022 WBOND Performed at [facility], [address], [place], [place] [address]   Blood culture (routine x 2)     Status: None (Preliminary result)   Collection Time: 10/30/22  5:11 PM   Specimen: BLOOD  Result Value Ref Range   Specimen Description BLOOD RIGHT ANTECUBITAL    Special Requests      BOTTLES DRAWN AEROBIC AND ANAEROBIC Blood Culture results may not be optimal due to an excessive volume of blood received in culture bottles Performed at [facility], [address], [place], [place] [address]    Culture PENDING    Report Status PENDING   Lactic acid, plasma     Status: None   Collection Time: 10/30/22  8:05 PM  Result Value Ref Range   Lactic Acid, Venous 1.2 0.5 - 1.9 mmol/L    Comment: Performed at [facility] Lab, [address], [place], [place] [address]   No results found.  Pending Labs Unresulted Labs (From admission, onward)     Start     Ordered   10/30/22 2210  HIV Antibody (routine testing w rflx)  (HIV Antibody (Routine testing w reflex) panel)  Once,   R        10/30/22 2212   10/30/22 1711  Blood culture (routine x 2)  BLOOD CULTURE X 2,   R      10/30/22 1737             Vitals/Pain Today's Vitals   10/30/22 2040 10/30/22 2123 10/30/22 2130 10/30/22 2200  BP: 131/83 138/75 133/76 126/72  Pulse: 82 81 81 81  Resp: 13 18 17 15   Temp:  98.4 F (36.9 C)    TempSrc:  Oral    SpO2: 100%  100% 100% 100%  Weight:      Height:      PainSc:        Isolation Precautions No active isolations  Medications Medications  vancomycin variable dose per unstable renal function (pharmacist dosing) (has no administration in time range)  insulin aspart (novoLOG) injection 0-9 Units (has no administration in time range)  insulin aspart (novoLOG) injection 0-5 Units (has no administration in time range)  lactated ringers bolus 1,000 mL (0 mLs Intravenous Stopped 10/30/22 2017)  HYDROcodone-acetaminophen (NORCO/VICODIN) 5-325 MG per tablet 1 tablet (1 tablet Oral Given 10/30/22 1747)  vancomycin (VANCOREADY) IVPB 1500 mg/300 mL (0 mg Intravenous Stopped 10/30/22 2058)  gadobutrol (GADAVIST) 1 MMOL/ML injection 7 mL (7 mLs Intravenous Contrast Given 10/30/22 1900)  lactated ringers bolus 1,000 mL (1,000 mLs Intravenous New Bag/Given 10/30/22 2214)    Mobility walks     Focused Assessments     R Recommendations: See Admitting Provider Note  Report given to:   Additional Notes:

## 2022-10-30 NOTE — Progress Notes (Signed)
Pharmacy Antibiotic Note  Kenneth Bailey is a 57 y.o. male for which pharmacy has been consulted for vancomycin dosing for  osteomyelitis . Patient with a history of COPD, DM, HTN, HLD. Patient presenting with left foot ulcer.  SCr 2.91 - AKI WBC 21.2; LA 1.2; T 98.4; HR 81; RR 18  Plan: Vancomycin 1500 mg once, subsequent dosing as indicated per random vancomycin level until renal function stable and/or improved, at which time scheduled dosing can be considered Trend WBC, Fever, Renal function F/u cultures, clinical course, WBC De-escalate when able  Height: 5\' 8"  (172.7 cm) Weight: 78.5 kg (173 lb) IBW/kg (Calculated) : 68.4  Temp (24hrs), Avg:97.9 F (36.6 C), Min:97.8 F (36.6 C), Max:98 F (36.7 C)  No results for input(s): "WBC", "CREATININE", "LATICACIDVEN", "VANCOTROUGH", "VANCOPEAK", "VANCORANDOM", "GENTTROUGH", "GENTPEAK", "GENTRANDOM", "TOBRATROUGH", "TOBRAPEAK", "TOBRARND", "AMIKACINPEAK", "AMIKACINTROU", "AMIKACIN" in the last 168 hours.  CrCl cannot be calculated (Patient's most recent lab result is older than the maximum 21 days allowed.).    Allergies  Allergen Reactions   Lisinopril     Leg swelling   Statins     Leg swelling   Microbiology results: Pending  Thank you for allowing pharmacy to be a part of this patient's care.  Delmar Landau, PharmD, BCPS 10/30/2022 5:43 PM ED Clinical Pharmacist -  [phone]

## 2022-10-30 NOTE — ED Notes (Signed)
Pt is a&ox4, pwd. Pt has a ulcer/wound on bottom of left foot. Foot is red and swollen. PMS intact. Pt complains of pain to foot. Pt denies any cp/shob. Pt changed into a gown, attached to monitor/vitals. Side rails up x 2, call light with patient.

## 2022-10-30 NOTE — H&P (Incomplete)
History and Physical  Kenneth Bailey ZOX:[id number] DOB: [birth year] DOA: 10/30/2022  Referring physician: Delice Bison, PA-EDP  PCP: [name], MD  Outpatient Specialists: Podiatrist in [place] Patient coming from: Home  Chief Complaint: Left foot pain.  HPI: Kenneth Bailey is a 57 y.o. male with medical history significant for uncontrolled type 2 diabetes, diabetic polyneuropathy, coronary artery disease status post CABG, chronic combined diastolic and systolic CHF, current smoker, who presented to [facility] ED at the recommendation of his podiatrist due to left foot pain with concern for osteomyelitis of the left fifth toe.  The patient endorses left foot ulcer with onset 4 weeks ago.  Initially went to [facility] ED where he was seen in the ED prescribed oral antibiotics and discharged home.  He followed up with [facility] and was referred to a podiatrist.  Had his first appointment with the podiatrist about a week ago was prescribed antibiotics.  Went to his second visit today.  He had imaging done with concern for osteomyelitis and was advised to come to [facility] ED.  The patient reports today his last dose of oral antibiotics.  Introduced himself to the ED the patient drove himself to [facility] ED.  Endorses feeling hot, possibly subjective fevers.  Denies chills.  Admits to nausea today without vomiting.  In the ED, the patient met SIRS criteria with left foot diabetic ulcer.  EDP discussed the case with podiatry who recommended admission for IV antibiotics, will see in consultation.  The patient was started on empiric IV antibiotics IV vancomycin.  Additionally received IV fluid boluses LR, 1 L x 2.  Received 1 dose of Vicodin.  TRH, hospitalist service was asked to admit.   ED Course: ***  Review of Systems: Review of systems as noted in the HPI. All other systems reviewed and are negative.   Past Medical History:  Diagnosis Date  . COPD (chronic obstructive pulmonary  disease) (HCC)   . Diabetes mellitus, type 2 (HCC)   . HLD (hyperlipidemia)   . Hypertension   . Tobacco abuse    Past Surgical History:  Procedure Laterality Date  . CORONARY ARTERY BYPASS GRAFT N/A 06/10/2021   Procedure: CORONARY ARTERY BYPASS GRAFTING (CABG) TIMES FOUR, USING LEFT INTERNAL MAMMARY ARTERY AND RIGHT GREATER SAPHENOUS VEIN HARVESTED ENDOSCOPICALLY;  Surgeon: Lovett Sox, MD;  Location: [facility] OR;  Service: Open Heart Surgery;  Laterality: N/A;  . ENDOVEIN HARVEST OF GREATER SAPHENOUS VEIN Right 06/10/2021   Procedure: ENDOVEIN HARVEST OF GREATER SAPHENOUS VEIN;  Surgeon: Lovett Sox, MD;  [facility] OR;  Service: Open Heart Surgery;  Laterality: Right;  . RIGHT/LEFT HEART CATH AND CORONARY ANGIOGRAPHY N/A 06/06/2021   Procedure: RIGHT/LEFT HEART CATH AND CORONARY ANGIOGRAPHY;  Surgeon: Dolores Patty, MD;  [facility] INVASIVE CV LAB;  Service: Cardiovascular;  Laterality: N/A;  . TEE WITHOUT CARDIOVERSION N/A 06/10/2021   Procedure: TRANSESOPHAGEAL ECHOCARDIOGRAM (TEE);  Surgeon: Lovett Sox, MD;  Location: [facility] OR;  Service: Open Heart Surgery;  Laterality: N/A;    Social History:  reports that he has been smoking cigarettes. He uses smokeless tobacco. No history on file for alcohol use and drug use.   Allergies  Allergen Reactions  . Lisinopril     Leg swelling  . Statins     Leg swelling    Family History  Problem Relation Age of Onset  . Hypertension Mother   . Cancer Mother   . Heart disease Father     ***  Prior to Admission  medications   Medication Sig Start Date End Date Taking? Authorizing Provider  acetaminophen (TYLENOL) 325 MG tablet Take 325 mg by mouth every 6 (six) hours as needed for moderate pain.   Yes [provider]  aspirin EC 81 MG tablet Take 1 tablet (81 mg total) by mouth daily. Swallow whole. 02/27/22  Yes Andrey Farmer, PA-C  dapagliflozin propanediol (FARXIGA) 10 MG TABS tablet Take 1 tablet (10 mg  total) by mouth daily before breakfast. 02/27/22  Yes Andrey Farmer, PA-C  gemfibrozil (LOPID) 600 MG tablet Take 600 mg by mouth 2 (two) times daily before a meal.   Yes [provider]  insulin glargine-yfgn (SEMGLEE, YFGN,) 100 UNIT/ML injection Inject 2-50 Units into the skin 2 (two) times daily. Sliding   Yes [provider]  Melatonin 10 MG TABS Take 10 mg by mouth at bedtime.   Yes [provider]  metFORMIN (GLUCOPHAGE) 1000 MG tablet Take 1,000 mg by mouth 2 (two) times daily. 05/26/21  Yes [provider]  nitroGLYCERIN (NITROSTAT) 0.4 MG SL tablet Place 1 tablet (0.4 mg total) under the tongue every 5 (five) minutes as needed for chest pain. 09/04/22 12/03/22 Yes Bensimhon, Bevelyn Buckles, MD  sacubitril-valsartan (ENTRESTO) 24-26 MG Take 1 tablet by mouth 2 (two) times daily. Patient not taking: Reported on 10/30/2022 03/20/22   Jacklynn Ganong, FNP    Physical Exam: BP 138/75 (BP Location: Right Arm)   Pulse 81   Temp 98.4 F (36.9 C) (Oral)   Resp 18   Ht 5\' 8"  (1.727 m)   Wt 78.5 kg   SpO2 100%   BMI 26.30 kg/m   General: 57 y.o. year-old male well developed well nourished in no acute distress.  Alert and oriented x3. Cardiovascular: Regular rate and rhythm with no rubs or gallops.  No thyromegaly or JVD noted.  No lower extremity edema. 2/4 pulses in all 4 extremities. Respiratory: Clear to auscultation with no wheezes or rales. Good inspiratory effort. Abdomen: Soft nontender nondistended with normal bowel sounds x4 quadrants. Muskuloskeletal: No cyanosis, clubbing or edema noted bilaterally Neuro: CN II-XII intact, strength, sensation, reflexes Skin: No ulcerative lesions noted or rashes Psychiatry: Judgement and insight appear normal. Mood is appropriate for condition and setting          Labs on Admission:  Basic Metabolic Panel: Recent Labs  Lab 10/30/22 1658  NA 135  K 4.7  CL 95*  CO2 21*  GLUCOSE 183*  BUN 48*   CREATININE 2.91*  CALCIUM 10.2   Liver Function Tests: No results for input(s): "AST", "ALT", "ALKPHOS", "BILITOT", "PROT", "ALBUMIN" in the last 168 hours. No results for input(s): "LIPASE", "AMYLASE" in the last 168 hours. No results for input(s): "AMMONIA" in the last 168 hours. CBC: Recent Labs  Lab 10/30/22 1658  WBC 21.2*  HGB 13.8  HCT 43.0  MCV 83.7  PLT 402*   Cardiac Enzymes: No results for input(s): "CKTOTAL", "CKMB", "CKMBINDEX", "TROPONINI" in the last 168 hours.  BNP (last 3 results) Recent Labs    02/27/22 0904  BNP 72.1    ProBNP (last 3 results) No results for input(s): "PROBNP" in the last 8760 hours.  CBG: No results for input(s): "GLUCAP" in the last 168 hours.  Radiological Exams on Admission: No results found.  EKG: I independently viewed the EKG done and my findings are as followed: ***   Assessment/Plan Present on Admission: . Osteomyelitis (HCC)  Principal Problem:   Osteomyelitis (HCC)  DVT prophylaxis: ***   Code Status: ***   Family Communication: ***   Disposition Plan: ***   Consults called: ***   Admission status: ***    Status is: Inpatient {Inpatient:23812}   Darlin Drop MD [facility] Pager [phone]  If 7PM-7AM, please contact night-coverage [email or web address] Password [email or web address]  10/30/2022, 10:12 PM

## 2022-10-30 NOTE — Progress Notes (Signed)
Subjective:  Patient ID: Kenneth Bailey, male    DOB: September 13, 1965,  MRN: [id number]  Chief Complaint  Patient presents with   Wound Check    Ulcer left foot. Patient has finished his antibiotics. C/o pain to left foot. Patient is soaking his foot and applying antibiotic ointment to his ulcer. Patient continues to use the surgical shoe.     57 y.o. male presents with concern for ulceration on the plantar aspect of the left foot subfifth metatarsal head.  Per the patient and his family member present this at the wound did probe to bone.  He has been applying antibiotic ointment and soaking with Epsom salt.  He was taking clindamycin as well as multiple other antibiotics which she showed me including cephalexin and Bactrim.  He reports that the foot is getting worse is having severe pain and starting to get redder recently even after being on antibiotics.  Wound culture was taken at last visit he saw Dr. Carlota Raspberry last week.  The wound culture showed MRSA infection.  He does report some recent nausea and vomiting.  He denies fever or chills.  Thinks the pain is getting worse and he has been feeling fatigued and overall ill the past few days  Past Medical History:  Diagnosis Date   COPD (chronic obstructive pulmonary disease) (HCC)    Diabetes mellitus, type 2 (HCC)    HLD (hyperlipidemia)    Hypertension    Tobacco abuse     Allergies  Allergen Reactions   Lisinopril     Leg swelling   Statins     Leg swelling    ROS: Negative except as per HPI above  Objective:  General: AAO x3, NAD  Dermatological: Attention directed to the left dorsal and lateral foot there is no to be significant redness swelling pain.  At the plantar aspect fifth metatarsal head on the left foot there is to be an ulceration present with significant edema.  There is probing also with sinus tract towards the fifth metatarsal head.      Vascular:  Dorsalis Pedis artery and Posterior Tibial artery pedal pulses are  2/4 bilateral.  Capillary fill time < 3 sec to all digits.   Neruologic: Grossly absent via light touch  Musculoskeletal: Significant pain with palpation about the left lateral forefoot near the fifth metatarsal head  Gait: Antalgic  No images are attached to the encounter.  Radiographs:  Date: 10/30/2022 XR the left foot Weightbearing AP/Lateral/Oblique   Findings: Attention directed the fifth metatarsal head there is possible osseous erosion seen on lateral view there is very faint early consistent with possible early osteomyelitis however not definitive on these views Assessment:   1. Ulcer of left foot, unspecified ulcer stage (HCC)   2. Cellulitis of left foot   3. Type 2 diabetes mellitus with foot ulcer (CODE) (HCC)      Plan:  Patient was evaluated and treated and all questions answered.  # Ulceration plantar fifth metatarsal head, concern for osteomyelitis of the fifth metatarsal head # Cellulitis of the left foot, failing outpatient antibiotics, MRSA infection -Discussed with patient that at this time given his systemic symptoms of nausea and vomiting as well as worsening redness swelling and pain in the left foot I recommend he proceed to the emergency department due to concern for sepsis -Recommend MRI of the left foot and IV antibiotic therapy, pending the results of the MRI patient may need debridement during the admission -Patient states awareness of the above  and states he will go to [facility] later today for emergency department evaluation -Wound was redressed with Betadine gauze dressing -Notified the on-call doctor for [facility] that the patient was being sent to the [facility], Dr. Ralene Cork.  Will plan to follow the patient once admitted        [name], DPM [facility] / [facility]

## 2022-10-30 NOTE — H&P (Incomplete)
History and Physical  Earlie Fredericks ZOX:[id number] DOB: 03/17/1966 DOA: 10/30/2022  Referring physician: Delice Bison, PA-EDP  PCP: [name], MD  Outpatient Specialists: Podiatrist in [place] Patient coming from: Home  Chief Complaint: Left foot pain.  HPI: Kalix Schey is a 57 y.o. male with medical history significant for uncontrolled type 2 diabetes, diabetic polyneuropathy, coronary artery disease status post CABG, chronic combined diastolic and systolic CHF, current smoker, who presented to [facility] ED from  home at the recommendation of his podiatrist due to left foot pain with concern for osteomyelitis of the left fifth toe.  The patient endorses left foot diabetic ulcer onset 4 weeks ago.  Initially went to [facility] ED where he was seen, prescribed oral antibiotics and discharged home.  He followed up with urgent care where he was prescribed antibiotics and was referred to a podiatrist.  Had his first appointment with the podiatrist about a week ago and was prescribed antibiotics.  Went to his second visit appointment today with the podiatrist.  He had imaging done that was concerning for osteomyelitis and was advised to come to [facility] ED.  The patient reports his last dose of oral antibiotics is today.  He drove himself to [facility] ED.  Endorses feeling hot at night, possibly from subjective fevers.  Denies chills.  Admits to nausea today without vomiting.  In the ED, the patient met SIRS criteria with pulse 106 and wbc 21.2K.  EDP discussed the case with podiatry who recommended admission for IV antibiotics and MRI left foot to confirm the osteomyelitis.  Podiatry will see in consultation.  The patient was started on empiric IV antibiotics IV vancomycin.  Additionally received IV fluid boluses LR, 1 L x 2.  Received 1 dose of Vicodin for moderate pain.  [facility], hospitalist service, was asked to admit.   ED Course: Temperature 98.  BP 156/77, pulse 80, respiratory 16, O2  saturation 99% on room air.  Lab studies remarkable for serum bicarb 21, glucose 183, BUN 48, creatinine 2.91, GFR 25, lactic acid 2.4, repeat 1.2.  WBC 21.K, platelet count 402K.  Review of Systems: Review of systems as noted in the HPI. All other systems reviewed and are negative.   Past Medical History:  Diagnosis Date   COPD (chronic obstructive pulmonary disease) (HCC)    Diabetes mellitus, type 2 (HCC)    HLD (hyperlipidemia)    Hypertension    Tobacco abuse    Past Surgical History:  Procedure Laterality Date   CORONARY ARTERY BYPASS GRAFT N/A 06/10/2021   Procedure: CORONARY ARTERY BYPASS GRAFTING (CABG) TIMES FOUR, USING LEFT INTERNAL MAMMARY ARTERY AND RIGHT GREATER SAPHENOUS VEIN HARVESTED ENDOSCOPICALLY;  Surgeon: Lovett Sox, MD;  Location: [facility] OR;  Service: Open Heart Surgery;  Laterality: N/A;   ENDOVEIN HARVEST OF GREATER SAPHENOUS VEIN Right 06/10/2021   Procedure: ENDOVEIN HARVEST OF GREATER SAPHENOUS VEIN;  Surgeon: Lovett Sox, MD;  Location: [facility] OR;  Service: Open Heart Surgery;  Laterality: Right;   RIGHT/LEFT HEART CATH AND CORONARY ANGIOGRAPHY N/A 06/06/2021   Procedure: RIGHT/LEFT HEART CATH AND CORONARY ANGIOGRAPHY;  Surgeon: Dolores Patty, MD;  Location: [facility] INVASIVE CV LAB;  Service: Cardiovascular;  Laterality: N/A;   TEE WITHOUT CARDIOVERSION N/A 06/10/2021   Procedure: TRANSESOPHAGEAL ECHOCARDIOGRAM (TEE);  Surgeon: Lovett Sox, MD;  Location: [facility] OR;  Service: Open Heart Surgery;  Laterality: N/A;    Social History:  reports that he has been smoking cigarettes. He uses smokeless tobacco. No history on file  for alcohol use and drug use.   Allergies  Allergen Reactions   Lisinopril     Leg swelling   Statins     Leg swelling    Family History  Problem Relation Age of Onset   Hypertension Mother    Cancer Mother    Heart disease Father       Prior to Admission medications   Medication Sig Start Date End Date Taking? Authorizing  Provider  acetaminophen (TYLENOL) 325 MG tablet Take 325 mg by mouth every 6 (six) hours as needed for moderate pain.   Yes [provider]  aspirin EC 81 MG tablet Take 1 tablet (81 mg total) by mouth daily. Swallow whole. 02/27/22  Yes Andrey Farmer, PA-C  dapagliflozin propanediol (FARXIGA) 10 MG TABS tablet Take 1 tablet (10 mg total) by mouth daily before breakfast. 02/27/22  Yes Andrey Farmer, PA-C  gemfibrozil (LOPID) 600 MG tablet Take 600 mg by mouth 2 (two) times daily before a meal.   Yes [provider]  insulin glargine-yfgn (SEMGLEE, YFGN,) 100 UNIT/ML injection Inject 2-50 Units into the skin 2 (two) times daily. Sliding   Yes [provider]  Melatonin 10 MG TABS Take 10 mg by mouth at bedtime.   Yes [provider]  metFORMIN (GLUCOPHAGE) 1000 MG tablet Take 1,000 mg by mouth 2 (two) times daily. 05/26/21  Yes [provider]  nitroGLYCERIN (NITROSTAT) 0.4 MG SL tablet Place 1 tablet (0.4 mg total) under the tongue every 5 (five) minutes as needed for chest pain. 09/04/22 12/03/22 Yes Bensimhon, Bevelyn Buckles, MD  sacubitril-valsartan (ENTRESTO) 24-26 MG Take 1 tablet by mouth 2 (two) times daily. Patient not taking: Reported on 10/30/2022 03/20/22   Jacklynn Ganong, FNP    Physical Exam: BP 138/75 (BP Location: Right Arm)   Pulse 81   Temp 98.4 F (36.9 C) (Oral)   Resp 18   Ht 5\' 8"  (1.727 m)   Wt 78.5 kg   SpO2 100%   BMI 26.30 kg/m   General: 57 y.o. year-old male well developed well nourished in no acute distress.  Alert and oriented x3. Cardiovascular: Regular rate and rhythm with no rubs or gallops.  No thyromegaly or JVD noted.  No lower extremity edema. 2/4 pulses in all 4 extremities. Respiratory: Clear to auscultation with no wheezes or rales. Good inspiratory effort. Abdomen: Soft nontender nondistended with normal bowel sounds x4 quadrants. Muskuloskeletal: No cyanosis, clubbing or edema noted  bilaterally Neuro: CN II-XII intact, strength, sensation, reflexes Skin:    Psychiatry: Judgement and insight appear normal. Mood is appropriate for condition and setting          Labs on Admission:  Basic Metabolic Panel: Recent Labs  Lab 10/30/22 1658  NA 135  K 4.7  CL 95*  CO2 21*  GLUCOSE 183*  BUN 48*  CREATININE 2.91*  CALCIUM 10.2   Liver Function Tests: No results for input(s): "AST", "ALT", "ALKPHOS", "BILITOT", "PROT", "ALBUMIN" in the last 168 hours. No results for input(s): "LIPASE", "AMYLASE" in the last 168 hours. No results for input(s): "AMMONIA" in the last 168 hours. CBC: Recent Labs  Lab 10/30/22 1658  WBC 21.2*  HGB 13.8  HCT 43.0  MCV 83.7  PLT 402*   Cardiac Enzymes: No results for input(s): "CKTOTAL", "CKMB", "CKMBINDEX", "TROPONINI" in the last 168 hours.  BNP (last 3 results) Recent Labs    02/27/22 0904  BNP 72.1    ProBNP (last 3 results) No  results for input(s): "PROBNP" in the last 8760 hours.  CBG: No results for input(s): "GLUCAP" in the last 168 hours.  Radiological Exams on Admission: No results found.  EKG: I independently viewed the EKG done and my findings are as followed: Sinus tachycardia rate of 106.  Nonspecific ST-T changes.  QTc 438.  Assessment/Plan Present on Admission:  Osteomyelitis (HCC)  Principal Problem:   Osteomyelitis (HCC)  Sepsis secondary to left lateral fifth toe osteomyelitis, POA Tachycardia 106, leukocytosis 21 K, suspected left fifth toe osteomyelitis MRI left foot is pending IV vancomycin ordered in the ED, renally dosed by pharmacy Obtain MRSA screening test Add cefepime to broaden coverage De-escalate antibiotics as able Continue IV fluid hydration NS at 50 cc/h x 1 day while on IV vancomycin. Reassess volume status daily while on IV fluid. Maintain MAP greater than 65 Monitor fever curve and WBC As needed analgesics with as needed bowel regimen. Podiatry consulted by  EDP  AKI suspect multifactorial, prerenal and in the setting of sepsis Baseline creatinine appears to be 1.3 with GFR greater than 60 Presented with creatinine of 2.91 with GFR of 25 Closely monitor renal function and urine output Start gentle IV fluid hydration Repeat BMP in the morning  Mild high anion gap metabolic acidosis in the setting of renal insufficiency Serum bicarb 21, anion gap 19 Repeat BMP after gentle IV fluid hydration.  Chronic combined diastolic and systolic CHF Euvolemic on exam Gentle IV fluid hydration NS at 50 cc/h x 1 day Reassess volume status daily while on IV fluid Start strict I's and O's and daily weight  Type 2 diabetes with hyperglycemia Last hemoglobin A1c 10.7 on 10/11/2022. Start insulin sliding scale. N.p.o. after midnight due to possible procedure by podiatry.  Tobacco use disorder Current smoker 1 pack/day Advised to quit smoking Nicotine patch in place.  Coronary artery disease status post CABG No reported anginal symptoms Hold off home aspirin due to possible surgical procedure Resume home Lopid.    DVT prophylaxis: Deferred chemical DVT prophylaxis to podiatry.  Code Status: Full code.  Family Communication: None at bedside.  Disposition Plan: Admitted to telemetry surgical unit.  Consults called: Podiatry.  Admission status: Inpatient status.   Status is: Inpatient The patient will require at least 2 midnights for further evaluation and treatment of present condition.   Darlin Drop MD [facility] Pager [phone]  If 7PM-7AM, please contact night-coverage [email or web address] Password [email or web address]  10/30/2022, 10:12 PM

## 2022-10-30 NOTE — ED Provider Notes (Signed)
Kenneth Bailey: [id number] Arrival date & time: 10/30/22  1642     History  Chief Complaint  Patient presents with   Wound Infection    Kenneth Bailey is a 57 y.o. male with medical history of COPD, diabetes, hypertension, hyperlipidemia, tobacco abuse.  Patient presents to ED for evaluation of left foot ulcer.  The patient reports that 3 weeks ago he developed what he believes to be arthritic type pain in his left fifth toe.  Patient reports that he was seen at an outside hospital and diagnosed with foot ulcer and referred to podiatry for further management.  The patient has been seen by podiatry numerous times in the 3 weeks since this foot ulcer has appeared.  He was seen at podiatry today where his podiatrist referred him to the ED due to concern for sepsis as well as osteomyelitis of the base of his fifth toe on the left foot.  The patient reports that yesterday he began experiencing nausea, vomiting and "feeling really hot" but he denies any overt or objective fevers at home.  He denies any drainage from his foot wound.  He has been applying antibiotic ointment and soaking his foot in Epsom salt baths at home.  The patient was also taking clindamycin as well as Keflex and Bactrim.  Patient states that the foot ulcer appears to have been getting worse since beginning antibiotics.  He had a wound culture taken 1 week ago during his visit with Kenneth Bailey.  This wound culture showed MRSA infection.  He denies any body aches or chills at home.  The patient was sent here by his podiatrist for admission and IV antibiotics as well as an MRI of his left foot.  HPI     Home Medications Prior to Admission medications   Medication Sig Start Date End Date Taking? Authorizing Provider  acetaminophen (TYLENOL) 325 MG tablet Take 325 mg by mouth every 6 (six) hours as needed for moderate pain.   Yes [provider]  aspirin EC 81  MG tablet Take 1 tablet (81 mg total) by mouth daily. Swallow whole. 02/27/22  Yes Andrey Farmer, PA-C  dapagliflozin propanediol (FARXIGA) 10 MG TABS tablet Take 1 tablet (10 mg total) by mouth daily before breakfast. 02/27/22  Yes Andrey Farmer, PA-C  gemfibrozil (LOPID) 600 MG tablet Take 600 mg by mouth 2 (two) times daily before a meal.   Yes [provider]  insulin glargine-yfgn (SEMGLEE, YFGN,) 100 UNIT/ML injection Inject 2-50 Units into the skin 2 (two) times daily. Sliding   Yes [provider]  Melatonin 10 MG TABS Take 10 mg by mouth at bedtime.   Yes [provider]  metFORMIN (GLUCOPHAGE) 1000 MG tablet Take 1,000 mg by mouth 2 (two) times daily. 05/26/21  Yes [provider]  nitroGLYCERIN (NITROSTAT) 0.4 MG SL tablet Place 1 tablet (0.4 mg total) under the tongue every 5 (five) minutes as needed for chest pain. 09/04/22 12/03/22 Yes Bensimhon, Bevelyn Buckles, MD  sacubitril-valsartan (ENTRESTO) 24-26 MG Take 1 tablet by mouth 2 (two) times daily. Patient not taking: Reported on 10/30/2022 03/20/22   Jacklynn Ganong, FNP      Allergies    Lisinopril and Statins    Review of Systems   Review of Systems  Constitutional:  Negative for fever.  Respiratory:  Negative for shortness of breath.   Cardiovascular:  Negative for chest pain.  Gastrointestinal:  Positive for nausea and vomiting.  Musculoskeletal:  Positive for arthralgias and myalgias.  All other systems reviewed and are negative.   Physical Exam Updated Vital Signs BP 138/75 (BP Location: Right Arm)   Pulse 81   Temp 98.4 F (36.9 C) (Oral)   Resp 18   Ht 5\' 8"  (1.727 m)   Wt 78.5 kg   SpO2 100%   BMI 26.30 kg/m  Physical Exam Vitals and nursing note reviewed.  Constitutional:      General: He is not in acute distress.    Appearance: Normal appearance. He is not ill-appearing, toxic-appearing or diaphoretic.  HENT:     Head: Normocephalic and atraumatic.      Nose: Nose normal.     Mouth/Throat:     Mouth: Mucous membranes are moist.     Pharynx: Oropharynx is clear.  Eyes:     Extraocular Movements: Extraocular movements intact.     Conjunctiva/sclera: Conjunctivae normal.     Pupils: Pupils are equal, round, and reactive to light.  Cardiovascular:     Rate and Rhythm: Regular rhythm. Tachycardia present.     Pulses:          Dorsalis pedis pulses are 2+ on the left side.  Pulmonary:     Effort: Pulmonary effort is normal.     Breath sounds: Normal breath sounds. No wheezing.  Abdominal:     General: Abdomen is flat. Bowel sounds are normal.     Palpations: Abdomen is soft.     Tenderness: There is no abdominal tenderness.  Musculoskeletal:     Cervical back: Normal range of motion and neck supple. No tenderness.     Comments: left dorsal lateral foot there is noted to be significant redness. fifth metatarsal head on the left foot there is to be an ulceration present   Skin:    General: Skin is warm and dry.     Capillary Refill: Capillary refill takes less than 2 seconds.  Neurological:     Mental Status: He is alert and oriented to person, place, and time.     ED Results / Procedures / Treatments   Labs (all labs ordered are listed, but only abnormal results are displayed) Labs Reviewed  CBC - Abnormal; Notable for the following components:      Result Value   WBC 21.2 (*)    Platelets 402 (*)    All other components within normal limits  BASIC METABOLIC PANEL - Abnormal; Notable for the following components:   Chloride 95 (*)    CO2 21 (*)    Glucose, Bld 183 (*)    BUN 48 (*)    Creatinine, Ser 2.91 (*)    GFR, Estimated 25 (*)    Anion gap 19 (*)    All other components within normal limits  LACTIC ACID, PLASMA - Abnormal; Notable for the following components:   Lactic Acid, Venous 2.4 (*)    All other components within normal limits  CULTURE, BLOOD (ROUTINE X 2)  CULTURE, BLOOD (ROUTINE X 2)  LACTIC ACID, PLASMA   HIV ANTIBODY (ROUTINE TESTING W REFLEX)    EKG None  Radiology No results found.  Procedures .Critical Care  Performed by: Al Decant, PA-C Authorized by: Al Decant, PA-C   Critical care provider statement:    Critical care time (minutes):  75   Critical care time was exclusive of:  Separately billable procedures and treating other patients and teaching time   Critical care  was necessary to treat or prevent imminent or life-threatening deterioration of the following conditions:  Sepsis and shock   Critical care was time spent personally by me on the following activities:  Ordering and performing treatments and interventions, ordering and review of laboratory studies, ordering and review of radiographic studies, pulse oximetry, re-evaluation of patient's condition, review of old charts, obtaining history from patient or surrogate, interpretation of cardiac output measurements, examination of patient, evaluation of patient's response to treatment, discussions with primary provider, discussions with consultants, development of treatment plan with patient or surrogate and blood draw for specimens   I assumed direction of critical care for this patient from another provider in my specialty: no     Care discussed with: admitting provider      Medications Ordered in ED Medications  lactated ringers bolus 1,000 mL (has no administration in time range)  vancomycin variable dose per unstable renal function (pharmacist dosing) (has no administration in time range)  lactated ringers bolus 1,000 mL (0 mLs Intravenous Stopped 10/30/22 2017)  HYDROcodone-acetaminophen (NORCO/VICODIN) 5-325 MG per tablet 1 tablet (1 tablet Oral Given 10/30/22 1747)  vancomycin (VANCOREADY) IVPB 1500 mg/300 mL (0 mg Intravenous Stopped 10/30/22 2058)  gadobutrol (GADAVIST) 1 MMOL/ML injection 7 mL (7 mLs Intravenous Contrast Given 10/30/22 1900)    ED Course/ Medical Decision Making/ A&P Clinical  Course as of 10/30/22 2213  Mon Oct 30, 2022  1744 Attention directed the fifth metatarsal head there is possible osseous erosion seen on lateral view there is very faint early consistent with possible early osteomyelitis however not definitive on these views (Xray imaging from today) [CG]  2118 Dr. Ralene Cork will follow once admitted. [CG]  2124 AKI, creatinine 1.3 1 month ago. SIRS criteria also met.  [CG]    Clinical Course User Index [CG] Al Decant, PA-C    Medical Decision Making Amount and/or Complexity of Data Reviewed Labs: ordered. Radiology: ordered.  Risk Prescription drug management. Decision regarding hospitalization.   57 year old male presents to ED for evaluation.  Please see HPI for further details.  On examination the patient is tachycardic, hypotensive.  He is afebrile.  His lung sounds are clear bilaterally and he is not hypoxic.  His abdomen is soft and compressible throughout.  Neurological examination at baseline.  Patient does have ulcer to left lateral dorsum of foot with surrounding redness and erythema.  X-ray imaging from earlier today reviewed which shows possible osseous erosions seen on lateral view which is very faint and possibly consistent with early osteomyelitis however not definitive.  Will proceed with MRI imaging of left foot to confirm or deny osteomyelitis.  Patient CBC shows leukocytosis to 21.2, no anemia.  Metabolic panel shows creatinine elevation to 2.91.  Patient creatinine 1 month ago was 1.3.  Patient also has anion gap of 19.  Patient lactic acid initially 2.4.  After 1 L LR, patient lactic acid 1.2.  Patient given 5 mg hydrocodone for pain.  Due to AKI, will give patient second liter of fluid.  At this time, the patient will require admission for osteomyelitis.  He will be followed by Dr. Ralene Cork of podiatry.  He was admitted to Dr. Margo Aye of the [facility] service who has agreed to admit patient.  Patient stable at time of  admission.   Final Clinical Impression(s) / ED Diagnoses Final diagnoses:  Sepsis, due to unspecified organism, unspecified whether acute organ dysfunction present (HCC)  Osteomyelitis, unspecified site, unspecified type (HCC)  AKI (acute kidney injury) (  [facility])    Rx / DC Orders ED Discharge Orders     None         Al Decant, PA-C 10/30/22 2213    Rozelle Logan, DO 10/31/22 [phone]

## 2022-10-30 NOTE — ED Notes (Signed)
.  edha 

## 2022-10-31 ENCOUNTER — Encounter (HOSPITAL_COMMUNITY)
Admission: EM | Disposition: A | Discharge status: Home / Self Care | Payer: Self-pay | Source: Home / Self Care | Attending: Internal Medicine

## 2022-10-31 ENCOUNTER — Other Ambulatory Visit: Payer: Self-pay

## 2022-10-31 ENCOUNTER — Inpatient Hospital Stay (HOSPITAL_COMMUNITY): Payer: BC Managed Care – PPO

## 2022-10-31 ENCOUNTER — Encounter (HOSPITAL_COMMUNITY): Payer: Self-pay | Admitting: Internal Medicine

## 2022-10-31 ENCOUNTER — Inpatient Hospital Stay (HOSPITAL_COMMUNITY): Payer: BC Managed Care – PPO | Admitting: Certified Registered Nurse Anesthetist

## 2022-10-31 ENCOUNTER — Encounter (HOSPITAL_COMMUNITY): Payer: BC Managed Care – PPO

## 2022-10-31 DIAGNOSIS — M86172 Other acute osteomyelitis, left ankle and foot: Secondary | ICD-10-CM

## 2022-10-31 DIAGNOSIS — L97509 Non-pressure chronic ulcer of other part of unspecified foot with unspecified severity: Secondary | ICD-10-CM

## 2022-10-31 HISTORY — PX: AMPUTATION TOE: SHX6595

## 2022-10-31 HISTORY — PX: AMPUTATION: SHX166

## 2022-10-31 LAB — CBC
HCT: 36.9 % — ABNORMAL LOW (ref 39.0–52.0)
Hemoglobin: 12 g/dL — ABNORMAL LOW (ref 13.0–17.0)
MCH: 27.6 pg (ref 26.0–34.0)
MCHC: 32.5 g/dL (ref 30.0–36.0)
MCV: 85 fL (ref 80.0–100.0)
Platelets: 290 10*3/uL (ref 150–400)
RBC: 4.34 MIL/uL (ref 4.22–5.81)
RDW: 14.2 % (ref 11.5–15.5)
WBC: 14.2 10*3/uL — ABNORMAL HIGH (ref 4.0–10.5)
nRBC: 0 % (ref 0.0–0.2)

## 2022-10-31 LAB — BASIC METABOLIC PANEL
Anion gap: 15 (ref 5–15)
BUN: 49 mg/dL — ABNORMAL HIGH (ref 6–20)
CO2: 22 mmol/L (ref 22–32)
Calcium: 9.4 mg/dL (ref 8.9–10.3)
Chloride: 100 mmol/L (ref 98–111)
Creatinine, Ser: 2.01 mg/dL — ABNORMAL HIGH (ref 0.61–1.24)
GFR, Estimated: 38 mL/min — ABNORMAL LOW (ref >60)
Glucose, Bld: 178 mg/dL — ABNORMAL HIGH (ref 70–99)
Potassium: 3.8 mmol/L (ref 3.5–5.1)
Sodium: 137 mmol/L (ref 135–145)

## 2022-10-31 LAB — HIV ANTIBODY (ROUTINE TESTING W REFLEX): HIV Screen 4th Generation wRfx: NONREACTIVE

## 2022-10-31 LAB — GLUCOSE, CAPILLARY
Glucose-Capillary: 110 mg/dL — ABNORMAL HIGH (ref 70–99)
Glucose-Capillary: 114 mg/dL — ABNORMAL HIGH (ref 70–99)
Glucose-Capillary: 136 mg/dL — ABNORMAL HIGH (ref 70–99)
Glucose-Capillary: 143 mg/dL — ABNORMAL HIGH (ref 70–99)
Glucose-Capillary: 161 mg/dL — ABNORMAL HIGH (ref 70–99)
Glucose-Capillary: 184 mg/dL — ABNORMAL HIGH (ref 70–99)

## 2022-10-31 LAB — PHOSPHORUS: Phosphorus: 5 mg/dL — ABNORMAL HIGH (ref 2.5–4.6)

## 2022-10-31 LAB — MAGNESIUM: Magnesium: 2 mg/dL (ref 1.7–2.4)

## 2022-10-31 LAB — MRSA NEXT GEN BY PCR, NASAL: MRSA by PCR Next Gen: NOT DETECTED

## 2022-10-31 LAB — VAS US ABI WITH/WO TBI
Left ABI: 0.6
Right ABI: 0.63

## 2022-10-31 LAB — AEROBIC/ANAEROBIC CULTURE W GRAM STAIN (SURGICAL/DEEP WOUND)

## 2022-10-31 LAB — CULTURE, BLOOD (ROUTINE X 2): Special Requests: ADEQUATE

## 2022-10-31 SURGERY — AMPUTATION, TOE
Anesthesia: General | Site: Toe | Laterality: Left

## 2022-10-31 MED ORDER — CHLORHEXIDINE GLUCONATE 0.12 % MT SOLN
15.0000 mL | Freq: Once | OROMUCOSAL | Status: AC
  Administered 2022-10-31: 15 mL via OROMUCOSAL

## 2022-10-31 MED ORDER — FENTANYL CITRATE (PF) 250 MCG/5ML IJ SOLN
INTRAMUSCULAR | Status: DC | PRN
  Administered 2022-10-31 (×2): 25 ug via INTRAVENOUS

## 2022-10-31 MED ORDER — LIDOCAINE 2% (20 MG/ML) 5 ML SYRINGE
INTRAMUSCULAR | Status: AC
  Filled 2022-10-31: qty 5

## 2022-10-31 MED ORDER — ONDANSETRON HCL 4 MG/2ML IJ SOLN
4.0000 mg | Freq: Once | INTRAMUSCULAR | Status: AC | PRN
  Administered 2022-10-31: 4 mg via INTRAVENOUS
  Filled 2022-10-31: qty 2

## 2022-10-31 MED ORDER — PHENYLEPHRINE 80 MCG/ML (10ML) SYRINGE FOR IV PUSH (FOR BLOOD PRESSURE SUPPORT)
PREFILLED_SYRINGE | INTRAVENOUS | Status: DC | PRN
  Administered 2022-10-31 (×3): 80 ug via INTRAVENOUS

## 2022-10-31 MED ORDER — SODIUM CHLORIDE 0.9 % IV SOLN: INTRAVENOUS | Status: DC

## 2022-10-31 MED ORDER — ASPIRIN 81 MG PO TBEC
81.0000 mg | DELAYED_RELEASE_TABLET | Freq: Every day | ORAL | Status: DC
  Administered 2022-11-01 – 2022-11-04 (×4): 81 mg via ORAL
  Filled 2022-10-31 (×4): qty 1

## 2022-10-31 MED ORDER — 0.9 % SODIUM CHLORIDE (POUR BTL) OPTIME
TOPICAL | Status: DC | PRN
  Administered 2022-10-31: 1000 mL

## 2022-10-31 MED ORDER — FENTANYL CITRATE (PF) 250 MCG/5ML IJ SOLN
INTRAMUSCULAR | Status: AC
  Filled 2022-10-31: qty 5

## 2022-10-31 MED ORDER — ORAL CARE MOUTH RINSE: 15.0000 mL | Freq: Once | OROMUCOSAL | Status: AC

## 2022-10-31 MED ORDER — ALUM & MAG HYDROXIDE-SIMETH 200-200-20 MG/5ML PO SUSP
15.0000 mL | Freq: Four times a day (QID) | ORAL | Status: DC | PRN
  Administered 2022-10-31 – 2022-11-13 (×2): 15 mL via ORAL
  Filled 2022-10-31 (×2): qty 30

## 2022-10-31 MED ORDER — SODIUM CHLORIDE 0.9 % IV SOLN
2.0000 g | Freq: Once | INTRAVENOUS | Status: AC
  Administered 2022-10-31: 2 g via INTRAVENOUS
  Filled 2022-10-31: qty 12.5

## 2022-10-31 MED ORDER — MELATONIN 5 MG PO TABS
10.0000 mg | ORAL_TABLET | Freq: Every day | ORAL | Status: DC
  Administered 2022-10-31 – 2022-11-16 (×17): 10 mg via ORAL
  Filled 2022-10-31 (×17): qty 2

## 2022-10-31 MED ORDER — LIDOCAINE HCL 2 % IJ SOLN
INTRAMUSCULAR | Status: AC
  Filled 2022-10-31: qty 20

## 2022-10-31 MED ORDER — ONDANSETRON HCL 4 MG/2ML IJ SOLN
INTRAMUSCULAR | Status: AC
  Filled 2022-10-31: qty 2

## 2022-10-31 MED ORDER — LACTATED RINGERS IV SOLN
INTRAVENOUS | Status: DC
Start: 2022-10-31 — End: 2022-10-31

## 2022-10-31 MED ORDER — ONDANSETRON HCL 4 MG/2ML IJ SOLN
INTRAMUSCULAR | Status: DC | PRN
  Administered 2022-10-31: 4 mg via INTRAVENOUS

## 2022-10-31 MED ORDER — PHENYLEPHRINE 80 MCG/ML (10ML) SYRINGE FOR IV PUSH (FOR BLOOD PRESSURE SUPPORT)
PREFILLED_SYRINGE | INTRAVENOUS | Status: AC
  Filled 2022-10-31: qty 10

## 2022-10-31 MED ORDER — PROPOFOL 10 MG/ML IV BOLUS
INTRAVENOUS | Status: DC | PRN
  Administered 2022-10-31: 140 mg via INTRAVENOUS
  Administered 2022-10-31 (×2): 30 mg via INTRAVENOUS
  Administered 2022-10-31: 20 mg via INTRAVENOUS

## 2022-10-31 MED ORDER — LIDOCAINE HCL 2 % IJ SOLN
INTRAMUSCULAR | Status: DC | PRN
  Administered 2022-10-31: 10 mL via SURGICAL_CAVITY

## 2022-10-31 MED ORDER — DOXYCYCLINE HYCLATE 100 MG PO CAPS: 100.0000 mg | ORAL_CAPSULE | Freq: Two times a day (BID) | ORAL | 0 refills | Status: DC

## 2022-10-31 MED ORDER — BUPIVACAINE HCL (PF) 0.5 % IJ SOLN
INTRAMUSCULAR | Status: AC
  Filled 2022-10-31: qty 30

## 2022-10-31 MED ORDER — DEXAMETHASONE SODIUM PHOSPHATE 10 MG/ML IJ SOLN
INTRAMUSCULAR | Status: AC
  Filled 2022-10-31: qty 1

## 2022-10-31 MED ORDER — GEMFIBROZIL 600 MG PO TABS
600.0000 mg | ORAL_TABLET | Freq: Two times a day (BID) | ORAL | Status: DC
  Administered 2022-10-31 – 2022-11-17 (×30): 600 mg via ORAL
  Filled 2022-10-31 (×40): qty 1

## 2022-10-31 MED ORDER — SODIUM CHLORIDE 0.9 % IV SOLN: 2.0000 g | INTRAVENOUS | Status: DC

## 2022-10-31 MED ORDER — DEXAMETHASONE SODIUM PHOSPHATE 10 MG/ML IJ SOLN
INTRAMUSCULAR | Status: DC | PRN
  Administered 2022-10-31: 4 mg via INTRAVENOUS

## 2022-10-31 MED ORDER — LIDOCAINE HCL (CARDIAC) PF 100 MG/5ML IV SOSY
PREFILLED_SYRINGE | INTRAVENOUS | Status: DC | PRN
  Administered 2022-10-31: 100 mg via INTRATRACHEAL

## 2022-10-31 MED ORDER — ENOXAPARIN SODIUM 40 MG/0.4ML IJ SOSY
40.0000 mg | PREFILLED_SYRINGE | INTRAMUSCULAR | Status: DC
  Administered 2022-11-01 – 2022-11-04 (×3): 40 mg via SUBCUTANEOUS
  Filled 2022-10-31 (×3): qty 0.4

## 2022-10-31 MED ORDER — CHLORHEXIDINE GLUCONATE 0.12 % MT SOLN
OROMUCOSAL | Status: AC
  Filled 2022-10-31: qty 15

## 2022-10-31 SURGICAL SUPPLY — 37 items
BLADE LONG MED 31X9 (MISCELLANEOUS) IMPLANT
BLADE SURG 15 STRL LF DISP TIS (BLADE) IMPLANT
BLADE SURG 15 STRL SS (BLADE) ×2
BNDG CMPR 5X4 KNIT ELC UNQ LF (GAUZE/BANDAGES/DRESSINGS) ×2
BNDG ELASTIC 4INX 5YD STR LF (GAUZE/BANDAGES/DRESSINGS) IMPLANT
BNDG ELASTIC 4X5.8 VLCR STR LF (GAUZE/BANDAGES/DRESSINGS) IMPLANT
BNDG GAUZE DERMACEA FLUFF 4 (GAUZE/BANDAGES/DRESSINGS) ×2 IMPLANT
BNDG GZE DERMACEA 4 6PLY (GAUZE/BANDAGES/DRESSINGS)
COVER SURGICAL LIGHT HANDLE (MISCELLANEOUS) ×2 IMPLANT
CUFF TOURN SGL QUICK 18X4 (TOURNIQUET CUFF) ×2 IMPLANT
CUFF TOURN SGL QUICK 24 (TOURNIQUET CUFF)
CUFF TRNQT CYL 24X4X16.5-23 (TOURNIQUET CUFF) IMPLANT
ELECT REM PT RETURN 9FT ADLT (ELECTROSURGICAL) ×2
ELECTRODE REM PT RTRN 9FT ADLT (ELECTROSURGICAL) IMPLANT
GAUZE PACKING IODOFORM 1/2INX (GAUZE/BANDAGES/DRESSINGS) IMPLANT
GAUZE PAD ABD 8X10 STRL (GAUZE/BANDAGES/DRESSINGS) ×2 IMPLANT
GAUZE SPONGE 4X4 12PLY STRL (GAUZE/BANDAGES/DRESSINGS) ×2 IMPLANT
GAUZE XEROFORM 1X8 LF (GAUZE/BANDAGES/DRESSINGS) ×2 IMPLANT
GOWN STRL REUS W/ TWL LRG LVL3 (GOWN DISPOSABLE) ×4 IMPLANT
GOWN STRL REUS W/TWL LRG LVL3 (GOWN DISPOSABLE) ×4
KIT BASIN OR (CUSTOM PROCEDURE TRAY) ×2 IMPLANT
KIT TURNOVER KIT B (KITS) ×2 IMPLANT
NS IRRIG 1000ML POUR BTL (IV SOLUTION) ×2 IMPLANT
PACK ORTHO EXTREMITY (CUSTOM PROCEDURE TRAY) ×2 IMPLANT
PAD ARMBOARD 7.5X6 YLW CONV (MISCELLANEOUS) ×4 IMPLANT
PAD CAST 4YDX4 CTTN HI CHSV (CAST SUPPLIES) ×2 IMPLANT
PADDING CAST COTTON 4X4 STRL (CAST SUPPLIES) ×2
SOL PREP POV-IOD 4OZ 10% (MISCELLANEOUS) ×2 IMPLANT
STAPLER VISISTAT 35W (STAPLE) IMPLANT
SUT ETHILON 3 0 FSL (SUTURE) IMPLANT
SWAB COLLECTION DEVICE MRSA (MISCELLANEOUS) IMPLANT
SWAB CULTURE ESWAB REG 1ML (MISCELLANEOUS) IMPLANT
SYR CONTROL 10ML LL (SYRINGE) ×2 IMPLANT
TOWEL GREEN STERILE (TOWEL DISPOSABLE) ×2 IMPLANT
TOWEL GREEN STERILE FF (TOWEL DISPOSABLE) ×2 IMPLANT
TUBE CONNECTING 12X1/4 (SUCTIONS) ×2 IMPLANT
YANKAUER SUCT BULB TIP NO VENT (SUCTIONS) IMPLANT

## 2022-10-31 NOTE — Plan of Care (Signed)
  Problem: Nutritional: Goal: Maintenance of adequate nutrition will improve Outcome: Progressing   Problem: Tissue Perfusion: Goal: Adequacy of tissue perfusion will improve Outcome: Progressing   Problem: Elimination: Goal: Will not experience complications related to bowel motility Outcome: Progressing   Problem: Pain Managment: Goal: General experience of comfort will improve Outcome: Progressing

## 2022-10-31 NOTE — Interval H&P Note (Signed)
History and Physical Interval Note:  10/31/2022 12:30 PM  Kenneth Bailey  has presented today for surgery, with the diagnosis of Left foot osteomyelitis.  The various methods of treatment have been discussed with the patient and family. After consideration of risks, benefits and other options for treatment, the patient has consented to  Procedure(s): IRRIGATION AND DEBRIDEMENTOF FOOT AND AMPUTATION OF TOE (Left) as a surgical intervention.  The patient's history has been reviewed, patient examined, no change in status, stable for surgery.  I have reviewed the patient's chart and labs.  Questions were answered to the patient's satisfaction.     Louann Sjogren

## 2022-10-31 NOTE — H&P (View-Only) (Signed)
Subjective:  Patient ID: Kenneth Bailey, male    DOB: 29-Mar-1966,  MRN: [id number]  Patient with past medical history of type 2 DM, CAD post CABG, CHF and current smoker  seen at beside today for concern of left foot worsening wound. He is known to our practice for the past week and has been on oral antibiotics. On follow-up patient was found to have worsening cellulitis and concern for osteomyelitis. Relates he has been having more pain in his foot and slightly improved today. Relates he may have had fevers and chills but doing well this morning.   Past Medical History:  Diagnosis Date   COPD (chronic obstructive pulmonary disease) (HCC)    Diabetes mellitus, type 2 (HCC)    HLD (hyperlipidemia)    Hypertension    Tobacco abuse      Past Surgical History:  Procedure Laterality Date   CORONARY ARTERY BYPASS GRAFT N/A 06/10/2021   Procedure: CORONARY ARTERY BYPASS GRAFTING (CABG) TIMES FOUR, USING LEFT INTERNAL MAMMARY ARTERY AND RIGHT GREATER SAPHENOUS VEIN HARVESTED ENDOSCOPICALLY;  Surgeon: Lovett Sox, MD;  Location: [facility] OR;  Service: Open Heart Surgery;  Laterality: N/A;   ENDOVEIN HARVEST OF GREATER SAPHENOUS VEIN Right 06/10/2021   Procedure: ENDOVEIN HARVEST OF GREATER SAPHENOUS VEIN;  Surgeon: Lovett Sox, MD;  Location: [facility] OR;  Service: Open Heart Surgery;  Laterality: Right;   RIGHT/LEFT HEART CATH AND CORONARY ANGIOGRAPHY N/A 06/06/2021   Procedure: RIGHT/LEFT HEART CATH AND CORONARY ANGIOGRAPHY;  Surgeon: Dolores Patty, MD;  Location: [facility] INVASIVE CV LAB;  Service: Cardiovascular;  Laterality: N/A;   TEE WITHOUT CARDIOVERSION N/A 06/10/2021   Procedure: TRANSESOPHAGEAL ECHOCARDIOGRAM (TEE);  Surgeon: Lovett Sox, MD;  Location: [facility] OR;  Service: Open Heart Surgery;  Laterality: N/A;       Latest Ref Rng & Units 10/31/2022    4:50 AM 10/30/2022    4:58 PM 06/24/2021    3:00 PM  CBC  WBC 4.0 - 10.5 K/uL 14.2  21.2  8.8   Hemoglobin 13.0 - 17.0 g/dL 40.9  81.1   91.4   Hematocrit 39.0 - 52.0 % 36.9  43.0  32.7   Platelets 150 - 400 K/uL 290  402  335        Latest Ref Rng & Units 10/31/2022    4:50 AM 10/30/2022    4:58 PM 09/04/2022    3:46 PM  BMP  Glucose 70 - 99 mg/dL 782  956  213   BUN 6 - 20 mg/dL 49  48  29   Creatinine 0.61 - 1.24 mg/dL 0.86  5.78  4.69   Sodium 135 - 145 mmol/L 137  135  134   Potassium 3.5 - 5.1 mmol/L 3.8  4.7  4.3   Chloride 98 - 111 mmol/L 100  95  97   CO2 22 - 32 mmol/L 22  21  25    Calcium 8.9 - 10.3 mg/dL 9.4  62.9  9.4      Objective:   Vitals:   10/30/22 2350 10/31/22 0724  BP: (!) 156/77 (!) 142/85  Pulse: 80 77  Resp: 16 17  Temp: 98 F (36.7 C) 98.3 F (36.8 C)  SpO2: 99% 97%    General:AA&O x 3. Normal mood and affect   Vascular: DP and PT pulses 2/4 bilateral. Brisk capillary refill to all digits. Pedal hair present   Neruological. Epicritic sensation grossly intact.   Derm: Plantar fifth metatarsal wound with fibrinous base measuring 0.5 cm x 0.2  cm. Erythema and edema noted periowound and tracking dorsally on the lateral foot Does probe to bone. Interspaces clears of maceration. Nails well groomed and normal in appearance. Tenderness to palpation of this area.   MSK: MMT 5/5 in dorsiflexion, plantar flexion, inversion and eversion. Normal joint ROM without pain or crepitus.        Awaiting MRI read  Awaiting ABIS.     Assessment & Plan:  Patient was evaluated and treated and all questions answered.  DX: Left diabetic foot infection and osteomyelitis  Wound care: betadine, DSD  Antibiotics: Per primary continue IV antibiotics.  DME: Heel WB in surgical shoe   WBC 14.2 down from 21.2 Discussed with patient diagnosis and treatment options.  Imaging reviewed. Awaiting final MRI read. There is edema and possible erosive changes noted in the fifth metatarsal head that is concerning for osteomyelitis. No changes noted in the fifth digit  Discussed with patient he will likely  need washout in the OR and possible fifth metatarsal head resection vs partial fifth ray amputation.  ABIs ordered given smoking history and concern for wound healing.  Patient in agreement with plan and all questions answered.  Will plan for OR this afternoon pending MRI results. Patient NPO.   Louann Sjogren, DPM  Accessible via secure chat for questions or concerns.

## 2022-10-31 NOTE — Progress Notes (Signed)
Pharmacy Antibiotic Note  Kenneth Bailey is a 57 y.o. male admitted on 10/30/2022 with  osteomyelitis .  Pharmacy has been consulted for cefepime dosing.  Already started on vanc.  Plan: Cefepime 2g IV Q24H.  Height: 5\' 8"  (172.7 cm) Weight: 78.5 kg (173 lb) IBW/kg (Calculated) : 68.4  Temp (24hrs), Avg:98.1 F (36.7 C), Min:97.8 F (36.6 C), Max:98.4 F (36.9 C)  Recent Labs  Lab 10/30/22 1658 10/30/22 2005  WBC 21.2*  --   CREATININE 2.91*  --   LATICACIDVEN 2.4* 1.2    Estimated Creatinine Clearance: 27.7 mL/min (A) (by C-G formula based on SCr of 2.91 mg/dL (H)).    Allergies  Allergen Reactions   Lisinopril     Leg swelling   Statins     Leg swelling    Thank you for allowing pharmacy to be a part of this patient's care.  Vernard Gambles, PharmD, BCPS  10/31/2022 3:43 AM

## 2022-10-31 NOTE — Transfer of Care (Signed)
Immediate Anesthesia Transfer of Care Note  Patient: Kenneth Bailey  Procedure(s) Performed: IRRIGATION AND DEBRIDEMENTOF LEFT FOOT (Left: Toe) AMPUTATION LEFT FIFTH TOE (Left: Fifth Toe)  Patient Location: PACU  Anesthesia Type:General  Level of Consciousness: drowsy  Airway & Oxygen Therapy: Patient Spontanous Breathing and Patient connected to face mask oxygen  Post-op Assessment: Report given to RN and Post -op Vital signs reviewed and stable  Post vital signs: Reviewed and stable  Last Vitals:  Vitals Value Taken Time  BP 93/57 10/31/22 1342  Temp    Pulse 80 10/31/22 1345  Resp 33 10/31/22 1345  SpO2 94 % 10/31/22 1345  Vitals shown include unvalidated device data.  Last Pain:  Vitals:   10/31/22 1232  TempSrc:   PainSc: 8       Patients Stated Pain Goal: 0 (10/31/22 1232)  Complications: No notable events documented.

## 2022-10-31 NOTE — Anesthesia Procedure Notes (Signed)
Procedure Name: LMA Insertion Date/Time: 10/31/2022 1:12 PM  Performed by: Garfield Cornea, CRNAPre-anesthesia Checklist: Patient identified, Emergency Drugs available, Suction available and Patient being monitored Patient Re-evaluated:Patient Re-evaluated prior to induction Oxygen Delivery Method: Circle System Utilized Preoxygenation: Pre-oxygenation with 100% oxygen Induction Type: IV induction Ventilation: Oral airway inserted - appropriate to patient size and Mask ventilation without difficulty LMA: LMA with gastric port inserted LMA Size: 4.0 Number of attempts: 1 Placement Confirmation: positive ETCO2 Tube secured with: Tape Dental Injury: Teeth and Oropharynx as per pre-operative assessment

## 2022-10-31 NOTE — Anesthesia Postprocedure Evaluation (Signed)
Anesthesia Post Note  Patient: Kenneth Bailey  Procedure(s) Performed: IRRIGATION AND DEBRIDEMENTOF LEFT FOOT (Left: Toe) AMPUTATION LEFT FIFTH TOE (Left: Fifth Toe)     Patient location during evaluation: PACU Anesthesia Type: General Level of consciousness: awake and alert Pain management: pain level controlled Vital Signs Assessment: post-procedure vital signs reviewed and stable Respiratory status: spontaneous breathing, nonlabored ventilation and respiratory function stable Cardiovascular status: blood pressure returned to baseline Postop Assessment: no apparent nausea or vomiting Anesthetic complications: no   No notable events documented.  Last Vitals:  Vitals:   10/31/22 1400 10/31/22 1415  BP: 97/65 104/60  Pulse: 80 81  Resp: 20 18  Temp:    SpO2: 90% 93%    Last Pain:  Vitals:   10/31/22 1342  TempSrc:   PainSc: 0-No pain                 Shanda Howells

## 2022-10-31 NOTE — Progress Notes (Signed)
ABI study completed.   Please see CV Procedures for preliminary results.  Sharnelle Cappelli, RVT  9:25 AM 10/31/22

## 2022-10-31 NOTE — Progress Notes (Signed)
Kenneth Bailey  ZOX:[id number] DOB: Feb 09, 1966 DOA: 10/30/2022 PCP: [name], MD    Brief Narrative:  57 year old with a history of uncontrolled DM2, CAD status post CABG, chronic combined systolic and diastolic congestive heart failure, and tobacco abuse who presented to [name], ER on the recommendation of his podiatrist with persistent left foot pain and concern for osteomyelitis of the left fifth toe.  He had been suffering with a left foot diabetic ulcer for approximately 4 weeks and had been prescribed a course of antibiotics through the [facility] ER.  He was also referred to a podiatrist who had followed him for approximately 1 week.  Imaging was done by that podiatrist with findings concerning for osteomyelitis, so the patient was contacted and instructed to report to the [facility], ER.  Consultants:  Podiatry  Goals of Care:  Code Status: Full Code   DVT prophylaxis: Lovenox  Interim Hx: Afebrile since admission.  Vital signs stable.  CBG controlled.  Creatinine has improved with simple volume expansion.  Resting comfortably in bed postoperatively.  No new complaints.  Assessment & Plan:  Left fifth toe and lateral foot cellulitis with possible osteomyelitis Continue empiric antibiotics -ABIs pending -MRI did note superficial ulceration at the lateral plantar aspect of the fifth metatarsal head with underlying early osteomyelitis of the fifth metatarsal head as well as phlegmon/early abscess formation - s/p left foot I&D with resection of fifth metatarsal head 6/11 in OR  Acute kidney injury Likely prerenal in setting of infection with poor intake -baseline creatinine approximately 1.3 with creatinine 2.9 at presentation -improving with judicious volume resuscitation  Chronic combined systolic and diastolic CHF Euvolemic/somewhat dehydrated at presentation -follow with gentle volume expansion  Tobacco abuse Has been counseled on the absolute need to  discontinue tobacco completely  CAD status post CABG Asymptomatic at present  Uncontrolled DM2 CBG controlled as an inpatient -check A1c -diabetic education  Family Communication:  Disposition: Lives alone -anticipate discharge home in 24-48 hours depending upon performance with PT/OT   Objective: Blood pressure (!) 142/85, pulse 77, temperature 98.3 F (36.8 C), temperature source Oral, resp. rate 17, height 5\' 8"  (1.727 m), weight 78.5 kg, SpO2 97 %.  Intake/Output Summary (Last 24 hours) at 10/31/2022 1102 Last data filed at 10/31/2022 0745 Gross per 24 hour  Intake 1295.11 ml  Output 950 ml  Net 345.11 ml   Filed Weights   10/30/22 1652  Weight: 78.5 kg    Examination: General: No acute respiratory distress Lungs: Clear to auscultation bilaterally without wheezes or crackles Cardiovascular: Regular rate and rhythm without murmur gallop or rub normal S1 and S2 Abdomen: Nontender, nondistended, soft, bowel sounds positive, no rebound, no ascites, no appreciable mass Extremities: No significant cyanosis, clubbing, or edema bilateral lower extremities -left foot dressed and dry at time of exam  CBC: Recent Labs  Lab 10/30/22 1658 10/31/22 0450  WBC 21.2* 14.2*  HGB 13.8 12.0*  HCT 43.0 36.9*  MCV 83.7 85.0  PLT 402* 290   Basic Metabolic Panel: Recent Labs  Lab 10/30/22 1658 10/31/22 0450  NA 135 137  K 4.7 3.8  CL 95* 100  CO2 21* 22  GLUCOSE 183* 178*  BUN 48* 49*  CREATININE 2.91* 2.01*  CALCIUM 10.2 9.4  MG  --  2.0  PHOS  --  5.0*   GFR: Estimated Creatinine Clearance: 40.2 mL/min (A) (by C-G formula based on SCr of 2.01 mg/dL (H)).   Scheduled Meds:  gemfibrozil  600 mg Oral BID AC   insulin aspart  0-5 Units Subcutaneous QHS   insulin aspart  0-9 Units Subcutaneous TID WC   nicotine  14 mg Transdermal Daily   vancomycin variable dose per unstable renal function (pharmacist dosing)   Does not apply See admin instructions   Continuous  Infusions:  sodium chloride 50 mL/hr at 10/31/22 0358   [START ON 11/01/2022] ceFEPime (MAXIPIME) IV       LOS: 1 day   Lonia Blood, MD [facility] Office  [phone] Pager - Text Page per Loretha Stapler  If 7PM-7AM, please contact night-coverage per Amion 10/31/2022, 11:02 AM

## 2022-10-31 NOTE — Brief Op Note (Signed)
10/30/2022 - 10/31/2022  12:31 PM  PATIENT:  Kenneth Bailey  57 y.o. male  PRE-OPERATIVE DIAGNOSIS:  Left foot osteomyelitis  POST-OPERATIVE DIAGNOSIS:  * No post-op diagnosis entered *  PROCEDURE:  Procedure(s): IRRIGATION AND DEBRIDEMENTOF FOOT AND AMPUTATION OF TOE (Left)  SURGEON:  Surgeon(s) and Role:    * Louann Sjogren, DPM - Primary  PHYSICIAN ASSISTANT:   ASSISTANTS: none   ANESTHESIA:   local and MAC  EBL:  Minimal   BLOOD ADMINISTERED:none  DRAINS: none   LOCAL MEDICATIONS USED:  LIDOCAINE and marcaine  and Amount: 10 ml  SPECIMEN:  Source of Specimen:  left fifth metatarsal  DISPOSITION OF SPECIMEN:   culture and pathology   COUNTS:  YES  TOURNIQUET:  * Missing tourniquet times found for documented tourniquets in log: [id number] *  DICTATION: .Note written in EPIC  PLAN OF CARE: Admit to inpatient   PATIENT DISPOSITION:  PACU - hemodynamically stable.   Delay start of Pharmacological VTE agent (>24hrs) due to surgical blood loss or risk of bleeding: yes

## 2022-10-31 NOTE — Addendum Note (Signed)
Addended byLucia Estelle D on: 10/31/2022 08:25 PM   Modules accepted: Orders

## 2022-10-31 NOTE — Op Note (Signed)
OPERATIVE REPORT Patient name: Kenneth Bailey MRN: [id number] DOB: 1965/06/07  DOS:  10/31/22  Preop Dx: Sepsis, due to unspecified organism, unspecified whether acute organ dysfunction present (HCC)  Osteomyelitis, unspecified site, unspecified type (HCC)  AKI (acute kidney injury) (HCC) Postop Dx: same  Procedure:  1. Left foot irrigation and debridement with resection of fifth metatarsal head  Surgeon: Louann Sjogren, DPM  Anesthesia: 50-50 mixture of 2% lidocaine plain with 0.5% Marcaine plain totaling 20 infiltrated in the patient's left lower extremity  Hemostasis: No TQ necessary   EBL: Minimal Materials: n/a Injectables: as above Pathology: Fifth metatarsal head for pathology, residual fifth metatarsal for culture   Condition: The patient tolerated the procedure and anesthesia well. No complications noted or reported   Justification for procedure: The patient is a 57 y.o. male who presents today for surgical treatment of left foot fifth metatarsal osteomyelitis and abscess of left foot. All conservative modalities of been unsuccessful in providing any sort of satisfactory alleviation of symptoms with the patient. The patient was told benefits as well as possible side effects of the surgery. The patient consented for surgical correction. The patient consent form was reviewed. All patient questions were answered. No guarantees were expressed or implied. The patient and the surgeon both signed the patient consent form with the witness present and placed in the patient's chart.   Procedure in Detail: The patient was brought to the operating room, placed in the operating table in the supine position at which time an aseptic scrub and drape were performed about the patient's respective lower extremity after anesthesia was induced as described above. Attention was then directed to the surgical area where procedure number one commenced.  Procedure #1:  Attention was  directed to the fifth metatarsal wound area were an incision was preformed encircling the bteh wound and extended proximally along the fifth metatarsal. Incision was made down to bone and digit dissection was preformed around the distal fifth metatarsal head. Minimal purulence encountered about 1 cc but significant amount of necrotic tissue After the head was  resected using a sagital saw it was passed off to the back table to be sent to pathology for further evaluation.. Any necrotic tissue was removed to healthy bleeding tissue. The remaining fifth metatarsal was identified and noted to be hard. The area was irrigated copiously sterile saline and residual bone was sent to microbiology. Any bleeders noted were cauterized as necessary. The skin was re-approximated utilizing 3-0 nylon suture and distal aspect of incision packed with sterile packing     Dry sterile compressive dressings were then applied to all previously mentioned incision sites about the patient's lower extremity. The patient was then transferred from the operating room to the recovery room having tolerated the procedure and anesthesia well. All vital signs are stable. After a brief stay in the recovery room the patient was readmitted to the floor.    Disposition:  Patient return to floor and will continue to follow-Keep dressing clean and intact.    Louann Sjogren, DPM [facility]  Dr. Louann Sjogren, DPM    [address] [place], [place] [address]                Office [phone]  Fax [phone]  [phone]   

## 2022-10-31 NOTE — TOC Initial Note (Signed)
Transition of Care [facility]) - Initial/Assessment Note    Patient Details  Name: Kenneth Bailey MRN: [id number] Date of Birth: May 30, 1965  Transition of [facility]) CM/SW Contact:    Lawerance Sabal, RN Phone Number: 10/31/2022, 10:18 AM  Clinical Narrative:                  Patient admitted from home, lives alone. No HH active in [facility] system.  Admitted for wound to food.  OR this afternoon pending MRI results for possible fifth metatarsal head resection vs partial fifth ray amputation.   Expected Discharge Plan: Home w Home Health Services     Patient Goals and CMS Choice            Expected Discharge Plan and Services   Discharge Planning Services: CM Consult   Living arrangements for the past 2 months: Mobile Home                                      Prior Living Arrangements/Services Living arrangements for the past 2 months: Mobile Home Lives with:: Self                   Activities of Daily Living Home Assistive Devices/Equipment: Dentures (specify type), Eyeglasses ADL Screening (condition at time of admission) Patient's cognitive ability adequate to safely complete daily activities?: Yes Is the patient deaf or have difficulty hearing?: No Does the patient have difficulty seeing, even when wearing glasses/contacts?: No Does the patient have difficulty concentrating, remembering, or making decisions?: No Patient able to express need for assistance with ADLs?: Yes Does the patient have difficulty dressing or bathing?: No Independently performs ADLs?: Yes (appropriate for developmental age) Does the patient have difficulty walking or climbing stairs?: No Weakness of Legs: Left Weakness of Arms/Hands: None  Permission Sought/Granted                  Emotional Assessment              Admission diagnosis:  Osteomyelitis (HCC) [M86.9] AKI (acute kidney injury) (HCC) [N17.9] Osteomyelitis, unspecified site, unspecified type (HCC)  [M86.9] Sepsis, due to unspecified organism, unspecified whether acute organ dysfunction present [facility]) [A41.9] Patient Active Problem List   Diagnosis Date Noted   Osteomyelitis (HCC) 10/30/2022   Tobacco abuse 09/29/2022   Hypertension 09/29/2022   HLD (hyperlipidemia) 09/29/2022   Diabetes mellitus, type 2 (HCC) 09/29/2022   COPD (chronic obstructive pulmonary disease) (HCC) 09/29/2022   Atherosclerotic heart disease 09/07/2021   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/11/2021   Acute systolic heart failure (HCC) 06/06/2021   Abscess of perineum 07/17/2018   Type 2 diabetes mellitus with hyperglycemia (HCC) 07/09/2018   Nicotine dependence, unspecified, uncomplicated 03/18/2018   Male erectile dysfunction, unspecified 05/17/2017   Chest pain 12/20/2016   Postural hypotension 12/20/2016   Syncope 12/20/2016   Anemia, unspecified 12/14/2016   PCP:  [name], MD Pharmacy:   [facility] - [place], [place] - [address] [place] [address] Phone: [phone] Fax: [phone]     Social Determinants of Health (SDOH) Social History: SDOH Screenings   Food Insecurity: No Food Insecurity (10/30/2022)  Housing: Low Risk  (10/30/2022)  Transportation Needs: No Transportation Needs (10/30/2022)  Utilities: Not At Risk (10/30/2022)  Financial Resource Strain: High Risk (01/09/2022)  Tobacco Use: High Risk (10/30/2022)   SDOH Interventions:  Readmission Risk Interventions     No data to display

## 2022-10-31 NOTE — Progress Notes (Signed)
Could you reach out to patient on Wed and let him (and family) know that he grew out MRSA in his wound?  Just got culture back.  Everyone at home must be sure to wash hands very well and keep wound covered with dressing.    I am starting him on doxycycline which it is sensitive to.  Sending in now.

## 2022-10-31 NOTE — Plan of Care (Signed)
  Problem: Clinical Measurements: Goal: Will remain free from infection Outcome: Progressing   Problem: Pain Managment: Goal: General experience of comfort will improve Outcome: Progressing   Problem: Safety: Goal: Ability to remain free from injury will improve Outcome: Progressing   Problem: Skin Integrity: Goal: Risk for impaired skin integrity will decrease Outcome: Progressing   

## 2022-10-31 NOTE — Consult Note (Signed)
Subjective:  Patient ID: Kenneth Bailey, male    DOB: 06/25/1965,  MRN: [id number]  Patient with past medical history of type 2 DM, CAD post CABG, CHF and current smoker  seen at beside today for concern of left foot worsening wound. He is known to our practice for the past week and has been on oral antibiotics. On follow-up patient was found to have worsening cellulitis and concern for osteomyelitis. Relates he has been having more pain in his foot and slightly improved today. Relates he may have had fevers and chills but doing well this morning.   Past Medical History:  Diagnosis Date   COPD (chronic obstructive pulmonary disease) (HCC)    Diabetes mellitus, type 2 (HCC)    HLD (hyperlipidemia)    Hypertension    Tobacco abuse      Past Surgical History:  Procedure Laterality Date   CORONARY ARTERY BYPASS GRAFT N/A 06/10/2021   Procedure: CORONARY ARTERY BYPASS GRAFTING (CABG) TIMES FOUR, USING LEFT INTERNAL MAMMARY ARTERY AND RIGHT GREATER SAPHENOUS VEIN HARVESTED ENDOSCOPICALLY;  Surgeon: Vantrigt, Peter, MD;  Location: [facility] OR;  Service: Open Heart Surgery;  Laterality: N/A;   ENDOVEIN HARVEST OF GREATER SAPHENOUS VEIN Right 06/10/2021   Procedure: ENDOVEIN HARVEST OF GREATER SAPHENOUS VEIN;  Surgeon: Vantrigt, Peter, MD;  Location: [facility] OR;  Service: Open Heart Surgery;  Laterality: Right;   RIGHT/LEFT HEART CATH AND CORONARY ANGIOGRAPHY N/A 06/06/2021   Procedure: RIGHT/LEFT HEART CATH AND CORONARY ANGIOGRAPHY;  Surgeon: Bensimhon, Daniel R, MD;  Location: [facility];  Service: Cardiovascular;  Laterality: N/A;   TEE WITHOUT CARDIOVERSION N/A 06/10/2021   Procedure: TRANSESOPHAGEAL ECHOCARDIOGRAM (TEE);  Surgeon: Vantrigt, Peter, MD;  Location: [facility] OR;  Service: Open Heart Surgery;  Laterality: N/A;       Latest Ref Rng & Units 10/31/2022    4:50 AM 10/30/2022    4:58 PM 06/24/2021    3:00 PM  CBC  WBC 4.0 - 10.5 K/uL 14.2  21.2  8.8   Hemoglobin 13.0 - 17.0 g/dL 12.0  13.8   10.9   Hematocrit 39.0 - 52.0 % 36.9  43.0  32.7   Platelets 150 - 400 K/uL 290  402  335        Latest Ref Rng & Units 10/31/2022    4:50 AM 10/30/2022    4:58 PM 09/04/2022    3:46 PM  BMP  Glucose 70 - 99 mg/dL 178  183  378   BUN 6 - 20 mg/dL 49  48  29   Creatinine 0.61 - 1.24 mg/dL 2.01  2.91  1.31   Sodium 135 - 145 mmol/L 137  135  134   Potassium 3.5 - 5.1 mmol/L 3.8  4.7  4.3   Chloride 98 - 111 mmol/L 100  95  97   CO2 22 - 32 mmol/L 22  21  25   Calcium 8.9 - 10.3 mg/dL 9.4  10.2  9.4      Objective:   Vitals:   10/30/22 2350 10/31/22 0724  BP: (!) 156/77 (!) 142/85  Pulse: 80 77  Resp: 16 17  Temp: 98 F (36.7 C) 98.3 F (36.8 C)  SpO2: 99% 97%    General:AA&O x 3. Normal mood and affect   Vascular: DP and PT pulses 2/4 bilateral. Brisk capillary refill to all digits. Pedal hair present   Neruological. Epicritic sensation grossly intact.   Derm: Plantar fifth metatarsal wound with fibrinous base measuring 0.5 cm x 0.2   cm. Erythema and edema noted periowound and tracking dorsally on the lateral foot Does probe to bone. Interspaces clears of maceration. Nails well groomed and normal in appearance. Tenderness to palpation of this area.   MSK: MMT 5/5 in dorsiflexion, plantar flexion, inversion and eversion. Normal joint ROM without pain or crepitus.        Awaiting MRI read  Awaiting ABIS.     Assessment & Plan:  Patient was evaluated and treated and all questions answered.  DX: Left diabetic foot infection and osteomyelitis  Wound care: betadine, DSD  Antibiotics: Per primary continue IV antibiotics.  DME: Heel WB in surgical shoe   WBC 14.2 down from 21.2 Discussed with patient diagnosis and treatment options.  Imaging reviewed. Awaiting final MRI read. There is edema and possible erosive changes noted in the fifth metatarsal head that is concerning for osteomyelitis. No changes noted in the fifth digit  Discussed with patient he will likely  need washout in the OR and possible fifth metatarsal head resection vs partial fifth ray amputation.  ABIs ordered given smoking history and concern for wound healing.  Patient in agreement with plan and all questions answered.  Will plan for OR this afternoon pending MRI results. Patient NPO.   Tamberly Pomplun, DPM  Accessible via secure chat for questions or concerns.  

## 2022-10-31 NOTE — Anesthesia Preprocedure Evaluation (Addendum)
Anesthesia Evaluation  Patient identified by MRN, date of birth, ID band Patient awake    Reviewed: Allergy & Precautions, NPO status , Patient's Chart, lab work & pertinent test results  History of Anesthesia Complications Negative for: history of anesthetic complications  Airway Mallampati: II  TM Distance: >3 FB Neck ROM: Full    Dental no notable dental hx.    Pulmonary COPD, Current Smoker and Patient abstained from smoking.   Pulmonary exam normal        Cardiovascular hypertension, + CAD and + CABG  Normal cardiovascular exam     Neuro/Psych negative neurological ROS  negative psych ROS   GI/Hepatic negative GI ROS, Neg liver ROS,,,  Endo/Other  diabetes    Renal/GU negative Renal ROS  negative genitourinary   Musculoskeletal negative musculoskeletal ROS (+)    Abdominal   Peds  Hematology negative hematology ROS (+)   Anesthesia Other Findings Day of surgery medications reviewed with patient.  Reproductive/Obstetrics negative OB ROS                             Anesthesia Physical Anesthesia Plan  ASA: 3  Anesthesia Plan: General   Post-op Pain Management: Minimal or no pain anticipated   Induction: Intravenous  PONV Risk Score and Plan: 1 and Treatment may vary due to age or medical condition, Dexamethasone and Ondansetron  Airway Management Planned: LMA  Additional Equipment: None  Intra-op Plan:   Post-operative Plan: Extubation in OR  Informed Consent: I have reviewed the patients History and Physical, chart, labs and discussed the procedure including the risks, benefits and alternatives for the proposed anesthesia with the patient or authorized representative who has indicated his/her understanding and acceptance.       Plan Discussed with: CRNA  Anesthesia Plan Comments:        Anesthesia Quick Evaluation

## 2022-11-01 ENCOUNTER — Encounter (HOSPITAL_COMMUNITY): Payer: Self-pay | Admitting: Podiatry

## 2022-11-01 DIAGNOSIS — M86172 Other acute osteomyelitis, left ankle and foot: Secondary | ICD-10-CM | POA: Diagnosis not present

## 2022-11-01 DIAGNOSIS — F17210 Nicotine dependence, cigarettes, uncomplicated: Secondary | ICD-10-CM | POA: Diagnosis not present

## 2022-11-01 DIAGNOSIS — E1165 Type 2 diabetes mellitus with hyperglycemia: Secondary | ICD-10-CM

## 2022-11-01 DIAGNOSIS — L02612 Cutaneous abscess of left foot: Secondary | ICD-10-CM

## 2022-11-01 DIAGNOSIS — A4902 Methicillin resistant Staphylococcus aureus infection, unspecified site: Secondary | ICD-10-CM | POA: Diagnosis not present

## 2022-11-01 LAB — BASIC METABOLIC PANEL
Anion gap: 16 — ABNORMAL HIGH (ref 5–15)
BUN: 53 mg/dL — ABNORMAL HIGH (ref 6–20)
CO2: 22 mmol/L (ref 22–32)
Calcium: 9.6 mg/dL (ref 8.9–10.3)
Chloride: 97 mmol/L — ABNORMAL LOW (ref 98–111)
Creatinine, Ser: 1.47 mg/dL — ABNORMAL HIGH (ref 0.61–1.24)
GFR, Estimated: 56 mL/min — ABNORMAL LOW (ref >60)
Glucose, Bld: 152 mg/dL — ABNORMAL HIGH (ref 70–99)
Potassium: 4.6 mmol/L (ref 3.5–5.1)
Sodium: 135 mmol/L (ref 135–145)

## 2022-11-01 LAB — CBC
HCT: 36.9 % — ABNORMAL LOW (ref 39.0–52.0)
Hemoglobin: 12 g/dL — ABNORMAL LOW (ref 13.0–17.0)
MCH: 27.3 pg (ref 26.0–34.0)
MCHC: 32.5 g/dL (ref 30.0–36.0)
MCV: 83.9 fL (ref 80.0–100.0)
Platelets: 353 10*3/uL (ref 150–400)
RBC: 4.4 MIL/uL (ref 4.22–5.81)
RDW: 13.9 % (ref 11.5–15.5)
WBC: 16.5 10*3/uL — ABNORMAL HIGH (ref 4.0–10.5)
nRBC: 0 % (ref 0.0–0.2)

## 2022-11-01 LAB — VANCOMYCIN, RANDOM: Vancomycin Rm: 8 ug/mL

## 2022-11-01 LAB — GLUCOSE, CAPILLARY
Glucose-Capillary: 198 mg/dL — ABNORMAL HIGH (ref 70–99)
Glucose-Capillary: 218 mg/dL — ABNORMAL HIGH (ref 70–99)
Glucose-Capillary: 211 mg/dL — ABNORMAL HIGH (ref 70–99)
Glucose-Capillary: 173 mg/dL — ABNORMAL HIGH (ref 70–99)

## 2022-11-01 LAB — MAGNESIUM: Magnesium: 2.2 mg/dL (ref 1.7–2.4)

## 2022-11-01 MED ORDER — HYDROMORPHONE HCL 1 MG/ML IJ SOLN
0.5000 mg | INTRAMUSCULAR | Status: DC | PRN
  Administered 2022-11-01 – 2022-11-17 (×64): 0.5 mg via INTRAVENOUS
  Filled 2022-11-01 (×65): qty 0.5

## 2022-11-01 MED ORDER — SODIUM CHLORIDE 0.9 % IV SOLN: INTRAVENOUS | Status: AC

## 2022-11-01 MED ORDER — VANCOMYCIN HCL 1250 MG/250ML IV SOLN
1250.0000 mg | INTRAVENOUS | Status: DC
  Administered 2022-11-02: 1250 mg via INTRAVENOUS
  Filled 2022-11-01: qty 250

## 2022-11-01 MED ORDER — VANCOMYCIN HCL IN DEXTROSE 1-5 GM/200ML-% IV SOLN
1000.0000 mg | Freq: Once | INTRAVENOUS | Status: AC
  Administered 2022-11-01: 1000 mg via INTRAVENOUS
  Filled 2022-11-01: qty 200

## 2022-11-01 MED ORDER — SODIUM CHLORIDE 0.9 % IV SOLN
2.0000 g | Freq: Two times a day (BID) | INTRAVENOUS | Status: DC
  Administered 2022-11-01 (×2): 2 g via INTRAVENOUS
  Filled 2022-11-01 (×3): qty 12.5

## 2022-11-01 NOTE — Progress Notes (Signed)
Physical Therapy Evaluation Patient Details Name: Kenneth Bailey MRN: [id number] DOB: Jun 09, 1965 Today's Date: 11/01/2022  History of Present Illness  57 y.o. male admitted on 10/30/2022 with osteomyelitis. He is now s/p Left foot irrigation and debridement with resection of fifth metatarsal head 11/01/22. Past medical history significant for uncontrolled type 2 diabetes, diabetic polyneuropathy, coronary artery disease status post CABG, chronic combined diastolic and systolic CHF, current smoker,  Clinical Impression  Pt was seen for progression of mobility on RW to get across room and sit up in chair, with pt demonstrating limited tolerance for  balancing and low safety awareness.  Checked BP sitting due to dizzy feelings, was initially 121/69 (83) then standing 112/76 (88), and post gait was 127/74 (91).  Reported all findings to nursing and pt was given safety alarm on chair for fall risk.  Follow along with him for gait and needs to attempt stairs in the AM.  Had low endurance and residual dizzy feeling, will await stairs for safety of symptoms.  Follow acutely for all goals of PT.     Recommendations for follow up therapy are one component of a multi-disciplinary discharge planning process, led by the attending physician.  Recommendations may be updated based on patient status, additional functional criteria and insurance authorization.  Follow Up Recommendations       Assistance Recommended at Discharge Intermittent Supervision/Assistance  Patient can return home with the following  A little help with walking and/or transfers;Assistance with cooking/housework;Help with stairs or ramp for entrance;Assist for transportation    Equipment Recommendations Rolling walker (2 wheels)  Recommendations for Other Services       Functional Status Assessment Patient has had a recent decline in their functional status and demonstrates the ability to make significant improvements in function in a  reasonable and predictable amount of time.     Precautions / Restrictions Precautions Precautions: Fall Precaution Comments: pt is on IV meds for pain Required Braces or Orthoses: Other Brace Other Brace: post op shoe Restrictions Weight Bearing Restrictions: Yes LLE Weight Bearing:  (heel WB only in post op shoe LLE)      Mobility  Bed Mobility Overal bed mobility: Needs Assistance Bed Mobility: Supine to Sit     Supine to sit: Min guard     General bed mobility comments: min guard for safety and for recent IV meds    Transfers Overall transfer level: Needs assistance Equipment used: Rolling walker (2 wheels) Transfers: Sit to/from Stand Sit to Stand: Min guard           General transfer comment: mild unsteadiness and dizzy feeling upon standing    Ambulation/Gait Ambulation/Gait assistance: Min guard Gait Distance (Feet): 38 Feet Assistive device: Rolling walker (2 wheels) Gait Pattern/deviations: Step-through pattern, Decreased stride length, Decreased weight shift to left Gait velocity: reduced Gait velocity interpretation: <1.31 ft/sec, indicative of household ambulator Pre-gait activities: standing balance ck General Gait Details: pt maintained heel wb on LLE  Stairs            Wheelchair Mobility    Modified Rankin (Stroke Patients Only)       Balance Overall balance assessment: Needs assistance Sitting-balance support: Feet supported Sitting balance-Leahy Scale: Fair     Standing balance support: Bilateral upper extremity supported, During functional activity Standing balance-Leahy Scale: Poor Standing balance comment: better with walker but reporting feeling dizzy upon standing with no positive orthostatics noted  Pertinent Vitals/Pain Pain Assessment Pain Assessment: Faces Faces Pain Scale: Hurts little more Pain Location: L foot with application of ortho shoe Pain Descriptors / Indicators:  Grimacing, Guarding Pain Intervention(s): Monitored during session, Repositioned, Premedicated before session    Home Living Family/patient expects to be discharged to:: Private residence Living Arrangements: Alone Available Help at Discharge: Family;Friend(s);Available PRN/intermittently Type of Home: Mobile home Home Access: Stairs to enter Entrance Stairs-Rails: Left Entrance Stairs-Number of Steps: 13   Home Layout: One level Home Equipment: None Additional Comments: pt is driving and fully I at home    Prior Function Prior Level of Function : Independent/Modified Independent;Driving             Mobility Comments: no limits, no AD needed       Hand Dominance   Dominant Hand: Right    Extremity/Trunk Assessment   Upper Extremity Assessment Upper Extremity Assessment: Overall WFL for tasks assessed    Lower Extremity Assessment Lower Extremity Assessment: LLE deficits/detail LLE Deficits / Details: 5th toe removal of metatarsal head LLE: Unable to fully assess due to immobilization LLE Coordination: decreased gross motor    Cervical / Trunk Assessment Cervical / Trunk Assessment: Normal  Communication   Communication: No difficulties  Cognition Arousal/Alertness: Awake/alert Behavior During Therapy: Impulsive Overall Cognitive Status: No family/caregiver present to determine baseline cognitive functioning                                 General Comments: nursing has given pt pain meds IV and posits this is his issue        General Comments General comments (skin integrity, edema, etc.): Pt was seen for progresion of movement to side of bed, to stand and to transition to chair with legs elevated.  Has limited ability to control balance at first but is good to maneuver and manage walker, not to walk alone however    Exercises     Assessment/Plan    PT Assessment Patient needs continued PT services  PT Problem List Decreased  strength;Decreased range of motion;Decreased activity tolerance;Decreased balance;Decreased mobility;Decreased coordination;Decreased knowledge of use of DME;Decreased safety awareness;Decreased skin integrity;Pain       PT Treatment Interventions DME instruction;Gait training;Functional mobility training;Therapeutic activities;Therapeutic exercise;Balance training;Neuromuscular re-education;Patient/family education;Stair training    PT Goals (Current goals can be found in the Care Plan section)  Acute Rehab PT Goals Patient Stated Goal: to go home soon PT Goal Formulation: With patient Time For Goal Achievement: 11/08/22 Potential to Achieve Goals: Good    Frequency Min 3X/week     Co-evaluation               AM-PAC PT "6 Clicks" Mobility  Outcome Measure Help needed turning from your back to your side while in a flat bed without using bedrails?: A Little Help needed moving from lying on your back to sitting on the side of a flat bed without using bedrails?: A Little Help needed moving to and from a bed to a chair (including a wheelchair)?: A Little Help needed standing up from a chair using your arms (e.g., wheelchair or bedside chair)?: A Little Help needed to walk in hospital room?: A Little Help needed climbing 3-5 steps with a railing? : Total 6 Click Score: 16    End of Session Equipment Utilized During Treatment: Gait belt Activity Tolerance: Patient tolerated treatment well Patient left: in chair;with call bell/phone within reach;with chair alarm set  Nurse Communication: Mobility status PT Visit Diagnosis: Unsteadiness on feet (R26.81);Other abnormalities of gait and mobility (R26.89);Difficulty in walking, not elsewhere classified (R26.2);Pain Pain - Right/Left: Left Pain - part of body: Ankle and joints of foot    Time: 1410-1443 PT Time Calculation (min) (ACUTE ONLY): 33 min   Charges:   PT Evaluation $PT Eval Moderate Complexity: 1 Mod PT  Treatments $Gait Training: 8-22 mins       Ivar Drape 11/01/2022, 4:18 PM  Samul Dada, PT PhD Acute Rehab Dept. Number: [facility] [id number] and [facility] [phone]

## 2022-11-01 NOTE — Progress Notes (Signed)
Subjective:  Patient ID: Kenneth Bailey, male    DOB: 01/11/1966,  MRN: [id number]  Patient POD#1 s/p left fifth metatarsal head resection and irrigation and debridement. Relates some pain but has improved. Relates some nausea but improving.   Past Medical History:  Diagnosis Date   COPD (chronic obstructive pulmonary disease) (HCC)    Diabetes mellitus, type 2 (HCC)    HLD (hyperlipidemia)    Hypertension    Tobacco abuse      Past Surgical History:  Procedure Laterality Date   AMPUTATION Left 10/31/2022   Procedure: AMPUTATION LEFT FIFTH TOE;  Surgeon: Louann Sjogren, DPM;  Location: [facility] OR;  Service: Podiatry;  Laterality: Left;   AMPUTATION TOE Left 10/31/2022   Procedure: IRRIGATION AND DEBRIDEMENTOF LEFT FOOT;  Surgeon: Louann Sjogren, DPM;  Location: [facility] OR;  Service: Podiatry;  Laterality: Left;   CORONARY ARTERY BYPASS GRAFT N/A 06/10/2021   Procedure: CORONARY ARTERY BYPASS GRAFTING (CABG) TIMES FOUR, USING LEFT INTERNAL MAMMARY ARTERY AND RIGHT GREATER SAPHENOUS VEIN HARVESTED ENDOSCOPICALLY;  Surgeon: Lovett Sox, MD;  Location: [facility] OR;  Service: Open Heart Surgery;  Laterality: N/A;   ENDOVEIN HARVEST OF GREATER SAPHENOUS VEIN Right 06/10/2021   Procedure: ENDOVEIN HARVEST OF GREATER SAPHENOUS VEIN;  Surgeon: Lovett Sox, MD;  Location: [facility] OR;  Service: Open Heart Surgery;  Laterality: Right;   RIGHT/LEFT HEART CATH AND CORONARY ANGIOGRAPHY N/A 06/06/2021   Procedure: RIGHT/LEFT HEART CATH AND CORONARY ANGIOGRAPHY;  Surgeon: Dolores Patty, MD;  Location: [facility];  Service: Cardiovascular;  Laterality: N/A;   TEE WITHOUT CARDIOVERSION N/A 06/10/2021   Procedure: TRANSESOPHAGEAL ECHOCARDIOGRAM (TEE);  Surgeon: Lovett Sox, MD;  Location: [facility] OR;  Service: Open Heart Surgery;  Laterality: N/A;       Latest Ref Rng & Units 11/01/2022   12:59 AM 10/31/2022    4:50 AM 10/30/2022    4:58 PM  CBC  WBC 4.0 - 10.5 K/uL 16.5  14.2  21.2   Hemoglobin 13.0  - 17.0 g/dL 40.9  81.1  91.4   Hematocrit 39.0 - 52.0 % 36.9  36.9  43.0   Platelets 150 - 400 K/uL 353  290  402        Latest Ref Rng & Units 11/01/2022   12:59 AM 10/31/2022    4:50 AM 10/30/2022    4:58 PM  BMP  Glucose 70 - 99 mg/dL 782  956  213   BUN 6 - 20 mg/dL 53  49  48   Creatinine 0.61 - 1.24 mg/dL 0.86  5.78  4.69   Sodium 135 - 145 mmol/L 135  137  135   Potassium 3.5 - 5.1 mmol/L 4.6  3.8  4.7   Chloride 98 - 111 mmol/L 97  100  95   CO2 22 - 32 mmol/L 22  22  21    Calcium 8.9 - 10.3 mg/dL 9.6  9.4  62.9      Objective:   Vitals:   11/01/22 0516 11/01/22 0809  BP: (!) 152/74 (!) 162/83  Pulse: 83 74  Resp: 18 18  Temp: 98.2 F (36.8 C) 98.3 F (36.8 C)  SpO2: 96% 97%    General:AA&O x 3. Normal mood and affect   Vascular: DP and PT pulses 2/4 bilateral. Brisk capillary refill to all digits. Pedal hair present   Neruological. Epicritic sensation grossly intact.   Derm: Incision well coapted, No signes of dehiscence. Some residual erythema to proximal incision but dorsal foot ertyhema much improved.  No purulence noted  MSK: MMT 5/5 in dorsiflexion, plantar flexion, inversion and eversion. Normal joint ROM without pain or crepitus.       Assessment & Plan:  Patient was evaluated and treated and all questions answered.  DX: POD #1 s/p fifth metatarsal head resection and irrigation and debridement  Patient to keep dressing clean dry and intact.  DME: Heel weight bearing in post-op shoe   Cultures growing staph aureus  Feel there was surgical cure of bone infection. Patient will likely need two weeks of oral antibotics upon discharge.  Patient is ok for discharge from podiatry standpoint and will follow-up in one week from discharge in clinic  Louann Sjogren, DPM  Accessible via secure chat for questions or concerns.

## 2022-11-01 NOTE — Consult Note (Signed)
[facility]    Date of Admission:  10/30/2022     Total days of antibiotics 2  Vancomycin  Cefepime         Reason for Consult: Osteomyelitis     Referring Provider: Marland Mcalpine Primary Care Provider: [name], MD    Assessment: Kenneth Bailey is a 57 y.o. male with diabetes, CAD, PAD, CHF, current smoker here with acute osteomyelitis of the left 5th metatarsal bone after multiple rounds or oral antibiotics outpatient over the last 44m.   Osteomyelitis of the 5th Metarsal s/p resection -  -pathology pending, cultures growing Staph Aureus with outpatient MRSA seen last week. Podiatry feels confident surgical resection was curative. Would conservatively provide 4 weeks of treatment following surgery. Will check in to see if oritavancin can be provided to spare GI s/e of oral antibiotics per his request with known MRSA.  -Contact precautions please  -smoking cessation recommended - pre-contemplative  -Will keep cefepime for now and follow culture given GNRs on stain.   Smoking -  -Counseled to quit to facilitate healing and with h/o moderate PAD   PIV Pain -  -Site looks to be infiltrated  -Pump stopped and message sent to RN   Plan: Continue vancomycin Contact precautions Will investigate oritavancin > dalvance for treatment but would probably cover with 4 weeks from surgery  Continue cefepime for now Follow cultures and pathology     Principal Problem:   Osteomyelitis (HCC) Active Problems:   Abscess of left foot    aspirin EC  81 mg Oral Daily   enoxaparin (LOVENOX) injection  40 mg Subcutaneous Q24H   gemfibrozil  600 mg Oral BID AC   insulin aspart  0-5 Units Subcutaneous QHS   insulin aspart  0-9 Units Subcutaneous TID WC   melatonin  10 mg Oral QHS   nicotine  14 mg Transdermal Daily    HPI: Kenneth Bailey is a 57 y.o. male admitted with left foot pain from home.   PMHx: T2DM, polyneuropathy, CAD s/p CABG,  combined A/C CHF, current smoker.   Recent history - follows with [place] [facility] team and recommended to go to the hospital for evaluation due to concern for acute osteomyelitis of the 5th left toe. He has had an ulcer here 4 weeks ago, seen in the hospital then for this problem and given a course of oral antibiotics. FU again in UC with the same plan and referred to podiatrist.   Cephalexin 5/11 for 7d  Augmentin 5/14 for 14d Clindamycin 5/22 for 7d  Office visit on 6/10 indicated he was taking cephalexin + clindamycin + bactrim and reported nausea/vomiting at that time.   Wound cultures from 6/5 outpatient swab show MRSA (R-tmp/s, S-Doxy)  ABI 6/12 indicates moderate R and L PAD.   Underwent left 5th metatarsal head resection and debridement. Intraoperative cultures from 5th metatarsal head reveal rare GPCs in pairs, Rare GNRs and cultures growing out staph aureus. He was on cefepime and vanc 24h prior to surgery. Pathology is pending.    Review of Systems: Review of Systems  Constitutional:  Negative for chills and fever.  Gastrointestinal:  Positive for nausea.  Musculoskeletal:  Positive for joint pain.    Past Medical History:  Diagnosis Date   COPD (chronic obstructive pulmonary disease) (HCC)    Diabetes mellitus, type 2 (HCC)    HLD (hyperlipidemia)    Hypertension    Tobacco abuse  Social History   Tobacco Use   Smoking status: Every Day    Types: Cigarettes   Smokeless tobacco: Current  Vaping Use   Vaping Use: Never used  Substance Use Topics   Alcohol use: Yes    Comment: occasionally   Drug use: Never    Family History  Problem Relation Age of Onset   Hypertension Mother    Cancer Mother    Heart disease Father    Allergies  Allergen Reactions   Lisinopril     Leg swelling   Statins     Leg swelling    OBJECTIVE: Blood pressure 122/69, pulse 76, temperature 98.2 F (36.8 C), resp. rate 18, height 5\' 8"  (1.727 m), weight 70.9 kg, SpO2  96 %.  Physical Exam Constitutional:      Appearance: Normal appearance.     Comments: In pain   Cardiovascular:     Rate and Rhythm: Normal rate and regular rhythm.  Pulmonary:     Effort: Pulmonary effort is normal.  Musculoskeletal:     Comments: Foot wrapped clean dry ace wrap.   Skin:    General: Skin is warm and dry.  Neurological:     Mental Status: He is alert and oriented to person, place, and time.     Lab Results Lab Results  Component Value Date   WBC 16.5 (H) 11/01/2022   HGB 12.0 (L) 11/01/2022   HCT 36.9 (L) 11/01/2022   MCV 83.9 11/01/2022   PLT 353 11/01/2022    Lab Results  Component Value Date   CREATININE 1.47 (H) 11/01/2022   BUN 53 (H) 11/01/2022   NA 135 11/01/2022   K 4.6 11/01/2022   CL 97 (L) 11/01/2022   CO2 22 11/01/2022    Lab Results  Component Value Date   ALT 13 09/04/2022   AST 23 09/04/2022   ALKPHOS 96 09/04/2022   BILITOT 0.8 09/04/2022      Microbiology: Recent Results (from the past 240 hour(s))  WOUND CULTURE     Status: Abnormal   Collection Time: 10/25/22  4:22 PM   Specimen: Foot, Left; Wound  Result Value Ref Range Status   MICRO NUMBER: [id number]  Final   SPECIMEN QUALITY: Adequate  Final   SOURCE: WOUND (SITE NOT SPECIFIED)  Final   STATUS: FINAL  Final   GRAM STAIN:   Final    No white blood cells seen No epithelial cells seen Moderate Gram positive cocci in pairs   RESULT:   Final    Additional organisms of questionable significance were isolated that normally do not warrant identification and susceptibilities. Please contact the laboratory within three days if identification and susceptibilities are clinically indicated.   ISOLATE 1: methicillin resistant Staphylococcus aureus (A)  Final    Comment: Moderate growth of Methicillin resistant Staphylococcus aureus (MRSA)      Susceptibility   Methicillin resistant staphylococcus aureus - AEROBIC CULT, GRAM STAIN POSITIVE 1    VANCOMYCIN 1 Sensitive      CIPROFLOXACIN >=8 Resistant     CLINDAMYCIN >=8 Resistant     LEVOFLOXACIN 4 Intermediate     ERYTHROMYCIN >=8 Resistant     GENTAMICIN <=0.5 Sensitive     OXACILLIN* NR Resistant      * Oxacillin-resistant staphylococci are resistant to all currently available beta-lactam antimicrobial agents with the possible exception of ceftaroline.     TETRACYCLINE <=1 Sensitive     TRIMETH/SULFA* >=320 Resistant      * Oxacillin-resistant staphylococci are  resistant to all currently available beta-lactam antimicrobial agents with the possible exception of ceftaroline. Legend: S = Susceptible  I = Intermediate R = Resistant  NS = Not susceptible * = Not tested  NR = Not reported **NN = See antimicrobic comments   Blood culture (routine x 2)     Status: None (Preliminary result)   Collection Time: 10/30/22  4:58 PM   Specimen: BLOOD  Result Value Ref Range Status   Specimen Description BLOOD LEFT FOREARM  Final   Special Requests   Final    BOTTLES DRAWN AEROBIC AND ANAEROBIC Blood Culture adequate volume   Culture   Final    NO GROWTH 2 DAYS Performed at [facility], [address] [place], [place] [address]    Report Status PENDING  Incomplete  Blood culture (routine x 2)     Status: None (Preliminary result)   Collection Time: 10/30/22  5:11 PM   Specimen: BLOOD  Result Value Ref Range Status   Specimen Description BLOOD RIGHT ANTECUBITAL  Final   Special Requests   Final    BOTTLES DRAWN AEROBIC AND ANAEROBIC Blood Culture results may not be optimal due to an excessive volume of blood received in culture bottles   Culture   Final    NO GROWTH 2 DAYS Performed at [facility], [address] [place], [place] [address]    Report Status PENDING  Incomplete  MRSA Next Gen by PCR, Nasal     Status: None   Collection Time: 10/31/22  3:44 AM   Specimen: Nasal Mucosa; Nasal Swab  Result Value Ref Range Status   MRSA by PCR Next Gen NOT DETECTED NOT DETECTED Final     Comment: (NOTE) The GeneXpert MRSA Assay (FDA approved for NASAL specimens only), is one component of a comprehensive MRSA colonization surveillance program. It is not intended to diagnose MRSA infection nor to guide or monitor treatment for MRSA infections. Test performance is not FDA approved in patients less than 48 years old. Performed at [facility], [address], [place], [place] [address]   Aerobic/Anaerobic Culture w Gram Stain (surgical/deep wound)     Status: None (Preliminary result)   Collection Time: 10/31/22  1:24 PM   Specimen: Toe, Left; Amputation  Result Value Ref Range Status   Specimen Description WOUND LEFT TOE  Final   Special Requests 5TH METATARSAL HEAD PT ON VANC CEFEPIME  Final   Gram Stain   Final    NO WBC SEEN RARE GRAM POSITIVE COCCI IN PAIRS RARE GRAM NEGATIVE RODS    Culture   Final    FEW STAPHYLOCOCCUS AUREUS SUSCEPTIBILITIES TO FOLLOW Performed at [facility], [address] [place], [place] [address]    Report Status PENDING  Incomplete     Rexene Alberts, MSN, NP-C [facility]  [email or web address] Pager: [phone] Office: [phone] [facility] Main Line: [phone] *Secure Chat Communication Welcome

## 2022-11-01 NOTE — Progress Notes (Signed)
Orthopedic Tech Progress Note Patient Details:  Kenneth Bailey [birth year] [id number]  Went to service patient with POST OP SHOE, and patient a;ready had the same shoe in great condition   Patient ID: Kenneth Bailey, male   DOB: [birth year], 57 y.o.   MRN: [id number]  Kenneth Bailey 11/01/2022, 10:01 AM

## 2022-11-01 NOTE — Progress Notes (Signed)
Pharmacy Antibiotic Note  Kenneth Bailey is a 57 y.o. male admitted on 10/30/2022 with osteomyelitis.  Pharmacy has been consulted for vancomycin and cefepime dosing.  Scr improving to baseline so will schedule maintenance vancomycin doses.  S/p I&D and resection of L foot 5th metatarsal head on 6/11. Wbc up a little from yesterday but did receive Decadron in OR.  Plan: Begin vancomycin 1250mg  IV q24 hours on 6/13 - (eAUC 476, Scr 1.46) Continue cefepime 2g IV q12 hours F/u cultures from OR  Height: 5\' 8"  (172.7 cm) Weight: 72.5 kg (159 lb 12.4 oz) IBW/kg (Calculated) : 68.4  Temp (24hrs), Avg:98.2 F (36.8 C), Min:97.9 F (36.6 C), Max:98.4 F (36.9 C)  Recent Labs  Lab 10/30/22 1658 10/30/22 2005 10/31/22 0450 11/01/22 0059  WBC 21.2*  --  14.2* 16.5*  CREATININE 2.91*  --  2.01* 1.47*  LATICACIDVEN 2.4* 1.2  --   --   VANCORANDOM  --   --   --  8     Estimated Creatinine Clearance: 54.9 mL/min (A) (by C-G formula based on SCr of 1.47 mg/dL (H)).    Allergies  Allergen Reactions   Lisinopril     Leg swelling   Statins     Leg swelling   Antimicrobials this admission:  Vancomycin 6/10 >>  Cefepime 6/11 >>   Dose adjustments this admission:  6/12 VR = 8 (~36h post vanc 1.5g) > given vanc 1g  Microbiology results:  6/10 BCx: ngtd 6/11 MRSA PCR: not detected 6/11 Foot Cx: culture w/ few staph aureus; GPC and GNR on gram stain  Prev micro data: 6/5 L foot Cx: MRSA  Thank you for allowing pharmacy to be a part of this patient's care.  Rexford Maus, PharmD, BCPS 11/01/2022 7:32 AM

## 2022-11-01 NOTE — Hospital Course (Addendum)
The patient is a 57 year old chronically ill-appearing Caucasian male with a past medical history significant for but not limited to uncontrolled diabetes mellitus type 2, CAD status post CABG, chronic combined systolic and diastolic CHF, tobacco abuse as well as other comorbidities who presented to [facility] emergency department at the recommendation of his podiatrist given his persistent left foot pain and concern for osteomyelitis of the left fifth toe.  He has been suffering with a left foot diabetic ulcer for approximately 4 weeks and will prescribe a course of antibiotics through the hospital hospital ED and was referred to podiatry who did follow him for approximate 1 week.  Imaging was done and the podiatrist evaluated and there is finding concerns for osteomyelitis.  Patient was contacted and instructed to go to [name] ED.  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    He was taken to the OR and is status post left foot incision and drainage with resection of fifth metatarsal head on 6/11 with cultures currently growing MRSA.  ID has been consulted for further evaluation recommendations. Given proximal incision necrosis, Podiatry recommends Vascular Consult for evaluation for wound healing and Vascular consulted and recommending obtaining an angiogram of the left lower leg with possible intervention through transfemoral access given that they suspect that he has tibial disease.  Patient was taken to the Cath Lab 11/03/22 and he underwent a left posterior tibial artery and tibioperoneal trunk angioplasty and was placed on aspirin and Plavix but no statin given his allergy.  Vascular surgery feels that he is optimized from their standpoint.  Patient has been complicated by hematemesis and  melanotic stools.  He was evaluated by GI and went under EGD and was found to have reflux esophagitis as well as a nonbleeding gastric ulcer with a clean base and a nonbleeding duodenal ulcer with flat pigmented spot biopsies were taken.  He is continue on PPI therapy and is recommended that he takes it 40 mg twice daily.  He was typed and screened and transfused 3 units of PRBCs.  He had a repeat I&D of his left foot and washout done on 11/08/2022.  Subsequently patient failed to improve and repeat MRI was done on 11/13/2022 which revealed findings consistent with residual osteomyelitis.  PT OT recommending SNF at discharge but the current plan is for the patient to return to the OR on 11/15/2022 given that he continues to have left pain in his foot.  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  ID was reconsulted given his leukocytosis and concern for lung wound healing and the recommendation from the infectious disease team was 6 weeks of Augmentin and doxycycline -ABIs done and showed "Right: Resting right  ankle-brachial index indicates moderate right lower  extremity arterial disease. The right toe-brachial index is  abnormal. Left: Resting left ankle-brachial index indicates moderate left lower extremity arterial disease."  -MRI did note superficial ulceration at the lateral plantar aspect of the fifth metatarsal head with underlying early osteomyelitis of the fifth  metatarsal head as well as phlegmon/early abscess formation  -WBC Trend: Recent Labs  Lab 11/04/22 1730 11/05/22 0547 11/11/22 0136 11/12/22 0153 11/13/22 0704 11/14/22 0800 11/15/22 0048 11/16/22 0433 11/17/22 1045  WBC  --    < > 16.4* 12.7* 11.7* 10.3 11.8* 11.8* 9.9  LATICACIDVEN 1.3  --   --   --   --   --   --   --   --    < > = values in this interval not displayed.  -The patient is s/p left foot I&D with resection of fifth metatarsal head 6/11 in OR with repeat I&D on 11/08/2022 and is going back to the OR on 11/15/2022 given that he has acute residual osteomyelitis involving the remaining fifth metatarsal and fifth ray and he is going for a revisional fifth ray amputation -Initial blood Cx x2 showing NGTD at 5 Days and repeat Blood Cx 11/04/22 showed NGTD at 5 Days -Initial Wound Cx obtained and showing: Gram Stain NO WBC SEEN RARE GRAM POSITIVE COCCI IN PAIRS RARE GRAM NEGATIVE RODS  Culture FEW METHICILLIN RESISTANT STAPHYLOCOCCUS AUREUS NO ANAEROBES ISOLATED Performed at [facility], [address] [place], [place] [address]  Report Status 11/05/2022 FINAL  Organism ID, Bacteria METHICILLIN RESISTANT STAPHYLOCOCCUS AUREUS  Resulting Agency [facility]     Susceptibility   Methicillin resistant staphylococcus aureus    MIC    CIPROFLOXACIN >=8 RESISTANT Resistant    CLINDAMYCIN >=8 RESISTANT Resistant    ERYTHROMYCIN >=8 RESISTANT Resistant    GENTAMICIN <=0.5 SENSI... Sensitive    Inducible Clindamycin NEGATIVE Sensitive    LINEZOLID 2 SENSITIVE Sensitive    OXACILLIN >=4 RESISTANT Resistant    RIFAMPIN <=0.5 SENSI... Sensitive    TETRACYCLINE <=1 SENSITIVE Sensitive    TRIMETH/SULFA 160 RESISTANT Resistant    VANCOMYCIN <=0.5 SENSI... Sensitive         -Repeat Wound Cx done and showing: Gram Stain NO WBC SEEN NO ORGANISMS SEEN  Culture RARE METHICILLIN RESISTANT STAPHYLOCOCCUS AUREUS RARE ENTEROCOCCUS FAECALIS CRITICAL RESULT CALLED TO, READ BACK  BY AND VERIFIED WITH: Alene Mires [id number] AT 1232 BY EC NO ANAEROBES ISOLATED Performed at [facility], [address], [place], [place] [address]  Report Status 11/13/2022 FINAL  Organism ID, Bacteria METHICILLIN RESISTANT STAPHYLOCOCCUS AUREUS  Organism ID, Bacteria ENTEROCOCCUS FAECALIS  Resulting Agency [facility]     Susceptibility    Methicillin resistant staphylococcus aureus Enterococcus faecalis   MIC MIC   AMPICILLIN   <=2 SENSITIVE Sensitive   CIPROFLOXACIN >=8 RESISTANT Resistant     CLINDAMYCIN >=8 RESISTANT Resistant     ERYTHROMYCIN >=8 RESISTANT Resistant     GENTAMICIN <=0.5 SENSI... Sensitive     GENTAMICIN SYNERGY   SENSITIVE Sensitive   Inducible Clindamycin NEGATIVE Sensitive     LINEZOLID 2 SENSITIVE Sensitive     OXACILLIN >=4 RESISTANT Resistant     RIFAMPIN <=0.5 SENSI... Sensitive     TETRACYCLINE <=1 SENSITIVE Sensitive     TRIMETH/SULFA >=320 RESIS... Resistant     VANCOMYCIN 1 SENSITIVE Sensitive 2 SENSITIVE Sensitive         -Continue with po Oxycodone 5 mg po q6hprn Moderate/Breakthrough Pain and IV Hydromorphone 0.5 mg IV q4hprn Severe Pain  -Vascular did an Abdominal  aortogram with left lower extremity runoff  and he had a left posterior tibial artery and tibioperoneal trunk angioplasty and was placed on Plavix -Vascular feels that the patient has inline flow down the left lower extremity to the posterior tibial artery retrograde after his and feels that he is optimized from a vascular standpoint and recommending Aspirin Plavix however this is now been stopped given his GI bleeding,: GI recommended dual antiplatelet therapy can be resumed on both aspirin and Plavix have been resumed -PT/OT initially recommended home health but patient needs further PT evaluation and did not participate given his pain has had repeat procedures with the I&D and now going back to the OR and went to the OR on 11/15/2022 and he is postoperative day 1 for  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 -She has now modified his weightbearing status to heel pressure only Podiatry has now cleared the patient for discharge will continue antibiotic management and will need to go to SNF for further evaluation and now the podiatrist recommends leaving the dressings in place intact until follow-up in office -Patient is stable for discharge   Acute Kidney Injury, Renal Fxn Fluctuating  Metabolic Acidosis, improved -Likely prerenal in setting of infection with poor intake -Baseline creatinine approximately 1.3 with creatinine 2.9 at presentation  -BUN/Cr Trend: Recent Labs  Lab 11/10/22 0138 11/12/22 0153 11/13/22 0704 11/14/22 0800 11/15/22 0048 11/16/22 0433 11/17/22 1045  BUN 33* 26* 34* 31* 32* 31* 34*  CREATININE 1.23 1.02 1.31* 1.17 1.32* 1.21 1.33*  -Has a CO2 of 25, anion gap of 9, chloride level of 97 -IVF now stopped -Avoid Nephrotoxic Medications, Contrast Dyes, Hypotension and Dehydration to Ensure Adequate Renal Perfusion and will need to Renally Adjust Meds -Continue to Monitor and Trend Renal Function carefully and repeat CMP within 1 week  Chronic Combined Systolic and Diastolic CHF -Euvolemic/somewhat dehydrated at presentation -follow with gentle volume expansion No intake or output data in the 24 hours ending 11/19/22 1815 -Strict I's and O's and Daily Weights -Patient is status post 3 units of PRBCs -Patient is -5.804 L since admission -Continue to Monitor for Signs and Symptoms of Volume Overload   Syncopal Episode in the setting of ABLA from Ulcers -In the Setting of GI bleeding and patient passed out after coming back from the bathroom and was lowered to the floor by nursing staff -Was placed on a Protonix drip and received 3 units of PRBCs and 1 L bolus when this happened -Continue with progressive care monitoring and will need PT OT to further evaluate and  treat now that he is more stable -Patient did well and will need to follow-up in outpatient setting  Hyperkalemia -Patient's potassium level trend: Recent Labs  Lab 11/10/22 0138 11/12/22 0153 11/13/22 0704 11/14/22 0800 11/15/22 0048 11/16/22 0433 11/17/22 1045  K 4.5 4.5 4.5 4.5 4.3 5.3* 4.5  -Given a dose of Lokelma 10 g x 1 -Continue to monitor and trend and repeat CMP within 1 week  Nausea and Vomiting, improved -In the setting of Abx and now GI Bleeding -Start IV Zofran and IV Compazine   Tobacco Abuse -Has been counseled on the absolute need to discontinue Tobacco completely -Continue with nicotine patch 14 mg transdermal every 24 hours  PAD -Seen by vascular surgery and he is status post abdominal aortogram with the left posterior tibial artery and to rule peroneal trunk angioplasty -Continue with aspirin, Plavix and currently  not on a statin -Follow-up with vascular in outpatient setting  HLD -C/w Gemfibrozil 600 mg po BID  -Repeat panel done and showed a total cholesterol/HDL ratio 7.0, cholesterol level 140, HDL 21, LDL 76, triglycerides 253, VLDL 51   CAD status post CABG -Asymptomatic at present -Initially was holding aspirin 81 mg p.o. daily as well as now clopidogrel 75 mg p.o. daily given GI bleeding the day before yesterday and is found to have gastric ulcers and duodenal ulcer as well as LA grade D reflux esophagitis with no bleeding: Now back on aspirin and Plavix -Not on a Statin due to Adverse Effects/Allergies    Uncontrolled DM2 -CBG controlled as an inpatient  -Recent A1c was 10.7 -C/w Diabetic education and adjustments -Will increase Semglee to 18 units twice daily and will also add 3 units of NovoLog 3 times daily meal coverage -Continue to Monitor CBGs per protocol; CBG Trend: Recent Labs  Lab 11/16/22 1233 11/16/22 1659 11/16/22 2122 11/16/22 2200 11/17/22 0806 11/17/22 1139 11/17/22 1639  GLUCAP 184* 207* 206* 215* 107* 195* 189*   -Regimen adjusted for discharge and will send him out on long-acting twice daily along with his regular home sliding scale and metformin as well as Farxiga  Normocytic Anemia and now ABLA in the setting of upper GI bleed with LA grade D reflux esophagitis and nonbleeding gastric ulcers with a clean base and a nonbleeding duodenal ulcer with a flat pigmented spot with Hematemesis and Melena -Hgb/Hct Trend: Recent Labs  Lab 11/11/22 0136 11/12/22 0153 11/13/22 0704 11/14/22 0800 11/15/22 0048 11/16/22 0433 11/17/22 1045  HGB 8.5* 8.6* 9.0* 8.2* 8.5* 8.3* 8.8*  HCT 24.9* 25.7* 26.8* 25.2* 26.0* 25.1* 26.5*  MCV 85.6 85.7 87.6 85.4 85.8 85.4 84.4  -Checked Anemia Panel and showed an iron level of 17, UIBC of 299, TIBC 360, saturation ratios 5%, ferritin 193, folate 9.5, vitamin B12 165 -Initiated on iron supplementation but this will not be stopped given his dark emesis and melanotic stools -Start IV PPI and type and screen and transfuse 2 units of PRBCs given that hemoglobin has now dropped to 7 -He was FOBT positive but has not had any further GI bleeding or episodes of hematochezia or melena -That is post 3 units of PRBCs were transfused -Held his aspirin and Plavix but GI recommending that spring can be resumed 11/06/2022 and Plavix can be resumed on 618 07/03/2022 -Continue to Monitor for S/Sx of Bleeding; No overt bleeding noted now -Repeat CBC in the AM; GI signed off the case and recommending calling back and there is any questions or concerns and recommending continuing PPI twice daily.  Patient has no further emesis or abdominal discomfort  Thrombocytosis -Setting of above -Continue monitor platelet count Trend Recent Labs  Lab 11/11/22 0136 11/12/22 0153 11/13/22 0704 11/14/22 0800 11/15/22 0048 11/16/22 0433 11/17/22 1045  PLT 365 374 443* 444* 461* 430* 414*  -Repeat CBC in the AM  Hyponatremia -Na+ Trend: Recent Labs  Lab 11/10/22 0138 11/12/22 0153  11/13/22 0704 11/14/22 0800 11/15/22 0048 11/16/22 0433 11/17/22 1045  NA 131* 130* 133* 133* 130* 131* 130*  -Continue to Monitor and Trend and Repeat CMP in the AM  GERD/GI prophylaxis/gastric ulcer and duodenal ulcer -Continue with twice daily PPI and follow-up with GI in outpatient setting -Gastroenterology was following and now signed off  Hypoalbuminemia -Patient's Albumin Trend: Recent Labs  Lab 11/05/22 0547 11/06/22 0153 11/07/22 0136 11/08/22 0109 11/12/22 0153 11/16/22 0433 11/17/22 1045  ALBUMIN 2.2*  2.0* 2.5* 2.5* 2.6* 2.8* 2.9*  -Continue to Monitor and Trend and repeat CMP in the AM  Tobacco Abuse -Continue with Nicotine Patch 14 mg TD -Smoking cessation counseling given  Generalized deconditioning and physical debility -PT initially recommended home health but now they are recommending SNF at discharge given that patient lives with his mother however patient is not willing to go to SNF given lack of resources and finding and will go home

## 2022-11-01 NOTE — Progress Notes (Signed)
PROGRESS NOTE    Kenneth Bailey  ZOX:[id number] DOB: 10-28-1965 DOA: 10/30/2022 PCP: [name], MD   Brief Narrative:  The patient is a 57 year old chronically ill-appearing Caucasian male with a past medical history significant for but not limited to uncontrolled diabetes mellitus type 2, CAD status post CABG, chronic combined systolic and diastolic CHF, tobacco abuse as well as other comorbidities who presented to [facility] emergency department at the recommendation of his podiatrist given his persistent left foot pain and concern for osteomyelitis of the left fifth toe.  He has been suffering with a left foot diabetic ulcer for approximately 4 weeks and will prescribe a course of antibiotics through the hospital hospital ED and was referred to podiatry who did follow him for approximate 1 week.  Imaging was done and the podiatrist evaluated and there is finding concerns for osteomyelitis.  Patient was contacted and instructed to go to [name] ED.  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  He was taken to the OR and is status post left foot incision and drainage with resection of fifth metatarsal head on 611 with cultures currently growing Staphylococcus Aureus with sensitivities pending.  ID has been consulted for further evaluation recommendations.  Assessment and Plan:  Left Fifth Toe and Lateral Foot Cellulitis with Osteomyelitis -Continue empiric Antibiotics with IV Cefepime q grams q12h and IV Vancomycin -ABIs done and showed "Right: Resting right  ankle-brachial index indicates moderate right lower  extremity arterial disease. The right toe-brachial index is abnormal. 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, [place], [place] [address]  -Continue with po Oxycodone 5 mg po q6hprn Moderate/Breakthrough Pain and IV Hydromorphone 0.5 mg IV q4hprn Severe Pain  -Consulted ID for further evaluation and recommendations given his Infection and Resection however podiatry feels that there is surgical cure of his bone infection -Podiatry is recommending heel weightbearing in the postoperative shoe -PT OT consulted however patient's pain limited participation   Acute Kidney Injury -Likely prerenal in setting of infection with poor intake -baseline creatinine approximately 1.3 with creatinine 2.9 at presentation  -BUN/Cr Trend: Recent Labs  Lab 10/30/22 1658 10/31/22 0450 11/01/22 0059  BUN 48* 49* 53*  CREATININE 2.91* 2.01* 1.47*  -Improving with judicious volume resuscitation normal saline at 50 MLS per hour for 2 days -Avoid Nephrotoxic Medications, Contrast Dyes, Hypotension and Dehydration to Ensure Adequate Renal Perfusion and will need to Renally Adjust Meds -Continue to Monitor and Trend Renal Function carefully and repeat CMP in the AM   Chronic Combined Systolic and  Diastolic CHF -Euvolemic/somewhat dehydrated at presentation -follow with gentle volume expansion  Intake/Output Summary (Last 24 hours) at  11/01/2022 1346 Last data filed at 11/01/2022 0015 Gross per 24 hour  Intake 240 ml  Output 2100 ml  Net -1860 ml  -Strict I's and O's and Daily Weights -Has been getting normal saline at 50 MLS per hour for 2 days. -Continue to Monitor for Signs and Symptoms of Volume Overload   Tobacco Abuse -Has been counseled on the absolute need to discontinue tobacco completely -Continue with nicotine patch 14 mg transdermal every 24 hours  HLD -C/w Gemfibrozil 600 mg po BID    CAD status post CABG -Asymptomatic at present -Continue with aspirin 81 mg p.o. daily   Uncontrolled DM2 -CBG controlled as an inpatient  -Recent A1c was 10.7 -C/w Diabetic education -Continue to Monitor CBGs per protocol; CBG Trend: Recent Labs  Lab 10/31/22 0722 10/31/22 1121 10/31/22 1346 10/31/22 1607 10/31/22 2052 11/01/22 0811 11/01/22 1128  GLUCAP 136* 114* 110* 143* 161* 198* 218*   Normocytic Anemia -Hgb/Hct Trend: Recent Labs  Lab 10/30/22 1658 10/31/22 0450 11/01/22 0059  HGB 13.8 12.0* 12.0*  HCT 43.0 36.9* 36.9*  MCV 83.7 85.0 83.9  -Check Anemia Panel in the AM -Continue to Monitor for S/Sx of Bleeding; No overt bleeding noted -Repeat CBC in the AM   DVT prophylaxis: enoxaparin (LOVENOX) injection 40 mg Start: 11/01/22 1000 SCDs Start: 10/30/22 2211    Code Status: Full Code Family Communication: No Family currently at bedside  Disposition Plan:  Level of care: Med-Surg Status is: Inpatient Remains inpatient appropriate because: Further clinical improvement and clearance and have consulted ID for further evaluation and antibiotic recommendations   Consultants:  Podiatry Infectious Diseases  Procedures:  Procedure by Dr. Ralene Cork:  1. Left foot irrigation and debridement with resection of fifth metatarsal head  Antimicrobials:  Anti-infectives (From admission, onward)    Start     Dose/Rate Route Frequency Ordered Stop   11/02/22 0500  vancomycin (VANCOREADY) IVPB 1250  mg/250 mL        1,250 mg 166.7 mL/hr over 90 Minutes Intravenous Every 24 hours 11/01/22 0721     11/01/22 0430  vancomycin (VANCOCIN) IVPB 1000 mg/200 mL premix        1,000 mg 200 mL/hr over 60 Minutes Intravenous  Once 11/01/22 0339 11/01/22 0620   11/01/22 0430  ceFEPIme (MAXIPIME) 2 g in sodium chloride 0.9 % 100 mL IVPB        2 g 200 mL/hr over 30 Minutes Intravenous Every 12 hours 11/01/22 0342     11/01/22 0400  ceFEPIme (MAXIPIME) 2 g in sodium chloride 0.9 % 100 mL IVPB  Status:  Discontinued        2 g 200 mL/hr over 30 Minutes Intravenous Every 24 hours 10/31/22 0342 11/01/22 0341   10/31/22 0430  ceFEPIme (MAXIPIME) 2 g in sodium chloride 0.9 % 100 mL IVPB        2 g 200 mL/hr over 30 Minutes Intravenous  Once 10/31/22 0342 10/31/22 0434   10/30/22 2138  vancomycin variable dose per unstable renal function (pharmacist dosing)  Status:  Discontinued         Does not apply See admin instructions 10/30/22 2138 11/01/22 0721   10/30/22 1745  vancomycin (VANCOREADY) IVPB 1500 mg/300 mL        1,500 mg 150 mL/hr over 120 Minutes Intravenous  Once 10/30/22 1743 10/30/22 2058       Subjective: Seen  and examined at bedside he is having quite a bit of pain in his foot still.  No nausea or vomiting.  Denies any lightheadedness or dizziness. Feels ok but no other concerns or complaints at this time.  Objective: Vitals:   11/01/22 0516 11/01/22 0809 11/01/22 1151 11/01/22 1259  BP: (!) 152/74 (!) 162/83  122/69  Pulse: 83 74  76  Resp: 18 18  18   Temp: 98.2 F (36.8 C) 98.3 F (36.8 C)  98.2 F (36.8 C)  TempSrc: Oral     SpO2: 96% 97%  96%  Weight:   70.9 kg   Height:        Intake/Output Summary (Last 24 hours) at 11/01/2022 1354 Last data filed at 11/01/2022 0015 Gross per 24 hour  Intake 240 ml  Output 2100 ml  Net -1860 ml   Filed Weights   10/30/22 1652 10/31/22 1226 11/01/22 1151  Weight: 78.5 kg 72.5 kg 70.9 kg   Examination: Physical  Exam:  Constitutional: Chronically ill appearing, older appearing than his stated age in some distress and appears uncomfortable due to pain Respiratory: Diminished to auscultation bilaterally, no wheezing, rales, rhonchi or crackles. Normal respiratory effort and patient is not tachypenic. No accessory muscle use. Unlabored breathing Cardiovascular: RRR, no murmurs / rubs / gallops. S1 and S2 auscultated. Left Foot wrapped  Abdomen: Soft, non-tender, non-distended. Bowel sounds positive.  GU: Deferred. Musculoskeletal: No clubbing / cyanosis of digits/nails. Left 5th metatarsal head has been resected Skin: No rashes, lesions, ulcers. No induration; Warm and dry.  Neurologic: CN 2-12 grossly intact with no focal deficits. Romberg sign and cerebellar reflexes not assessed.  Psychiatric: Normal judgment and insight. Alert and oriented x 3. Normal mood and appropriate affect.   Data Reviewed: I have personally reviewed following labs and imaging studies  CBC: Recent Labs  Lab 10/30/22 1658 10/31/22 0450 11/01/22 0059  WBC 21.2* 14.2* 16.5*  HGB 13.8 12.0* 12.0*  HCT 43.0 36.9* 36.9*  MCV 83.7 85.0 83.9  PLT 402* 290 353   Basic Metabolic Panel: Recent Labs  Lab 10/30/22 1658 10/31/22 0450 11/01/22 0059  NA 135 137 135  K 4.7 3.8 4.6  CL 95* 100 97*  CO2 21* 22 22  GLUCOSE 183* 178* 152*  BUN 48* 49* 53*  CREATININE 2.91* 2.01* 1.47*  CALCIUM 10.2 9.4 9.6  MG  --  2.0 2.2  PHOS  --  5.0*  --    GFR: Estimated Creatinine Clearance: 54.9 mL/min (A) (by C-G formula based on SCr of 1.47 mg/dL (H)). Liver Function Tests: No results for input(s): "AST", "ALT", "ALKPHOS", "BILITOT", "PROT", "ALBUMIN" in the last 168 hours. No results for input(s): "LIPASE", "AMYLASE" in the last 168 hours. No results for input(s): "AMMONIA" in the last 168 hours. Coagulation Profile: No results for input(s): "INR", "PROTIME" in the last 168 hours. Cardiac Enzymes: No results for input(s):  "CKTOTAL", "CKMB", "CKMBINDEX", "TROPONINI" in the last 168 hours. BNP (last 3 results) No results for input(s): "PROBNP" in the last 8760 hours. HbA1C: No results for input(s): "HGBA1C" in the last 72 hours. CBG: Recent Labs  Lab 10/31/22 1346 10/31/22 1607 10/31/22 2052 11/01/22 0811 11/01/22 1128  GLUCAP 110* 143* 161* 198* 218*   Lipid Profile: No results for input(s): "CHOL", "HDL", "LDLCALC", "TRIG", "CHOLHDL", "LDLDIRECT" in the last 72 hours. Thyroid Function Tests: No results for input(s): "TSH", "T4TOTAL", "FREET4", "T3FREE", "THYROIDAB" in the last 72 hours. Anemia Panel: No results for input(s): "VITAMINB12", "FOLATE", "FERRITIN", "TIBC", "IRON", "  RETICCTPCT" in the last 72 hours. Sepsis Labs: Recent Labs  Lab 10/30/22 1658 10/30/22 2005  LATICACIDVEN 2.4* 1.2    Recent Results (from the past 240 hour(s))  WOUND CULTURE     Status: Abnormal   Collection Time: 10/25/22  4:22 PM   Specimen: Foot, Left; Wound  Result Value Ref Range Status   MICRO NUMBER: [id number]  Final   SPECIMEN QUALITY: Adequate  Final   SOURCE: WOUND (SITE NOT SPECIFIED)  Final   STATUS: FINAL  Final   GRAM STAIN:   Final    No white blood cells seen No epithelial cells seen Moderate Gram positive cocci in pairs   RESULT:   Final    Additional organisms of questionable significance were isolated that normally do not warrant identification and susceptibilities. Please contact the laboratory within three days if identification and susceptibilities are clinically indicated.   ISOLATE 1: methicillin resistant Staphylococcus aureus (A)  Final    Comment: Moderate growth of Methicillin resistant Staphylococcus aureus (MRSA)      Susceptibility   Methicillin resistant staphylococcus aureus - AEROBIC CULT, GRAM STAIN POSITIVE 1    VANCOMYCIN 1 Sensitive     CIPROFLOXACIN >=8 Resistant     CLINDAMYCIN >=8 Resistant     LEVOFLOXACIN 4 Intermediate     ERYTHROMYCIN >=8 Resistant     GENTAMICIN  <=0.5 Sensitive     OXACILLIN* NR Resistant      * Oxacillin-resistant staphylococci are resistant to all currently available beta-lactam antimicrobial agents with the possible exception of ceftaroline.     TETRACYCLINE <=1 Sensitive     TRIMETH/SULFA* >=320 Resistant      * Oxacillin-resistant staphylococci are resistant to all currently available beta-lactam antimicrobial agents with the possible exception of ceftaroline. Legend: S = Susceptible  I = Intermediate R = Resistant  NS = Not susceptible * = Not tested  NR = Not reported **NN = See antimicrobic comments   Blood culture (routine x 2)     Status: None (Preliminary result)   Collection Time: 10/30/22  4:58 PM   Specimen: BLOOD  Result Value Ref Range Status   Specimen Description BLOOD LEFT FOREARM  Final   Special Requests   Final    BOTTLES DRAWN AEROBIC AND ANAEROBIC Blood Culture adequate volume   Culture   Final    NO GROWTH 2 DAYS Performed at [facility], [address] [place], [place] [address]    Report Status PENDING  Incomplete  Blood culture (routine x 2)     Status: None (Preliminary result)   Collection Time: 10/30/22  5:11 PM   Specimen: BLOOD  Result Value Ref Range Status   Specimen Description BLOOD RIGHT ANTECUBITAL  Final   Special Requests   Final    BOTTLES DRAWN AEROBIC AND ANAEROBIC Blood Culture results may not be optimal due to an excessive volume of blood received in culture bottles   Culture   Final    NO GROWTH 2 DAYS Performed at [facility], [address], [place], [place] [address]    Report Status PENDING  Incomplete  MRSA Next Gen by PCR, Nasal     Status: None   Collection Time: 10/31/22  3:44 AM   Specimen: Nasal Mucosa; Nasal Swab  Result Value Ref Range Status   MRSA by PCR Next Gen NOT DETECTED NOT DETECTED Final    Comment: (NOTE) The GeneXpert MRSA Assay (FDA approved for NASAL specimens only), is one component of  a comprehensive MRSA colonization  surveillance program. It is not intended to diagnose MRSA infection nor to guide or monitor treatment for MRSA infections. Test performance is not FDA approved in patients less than 10 years old. Performed at [facility], [address] [place], [place] [address]   Aerobic/Anaerobic Culture w Gram Stain (surgical/deep wound)     Status: None (Preliminary result)   Collection Time: 10/31/22  1:24 PM   Specimen: Toe, Left; Amputation  Result Value Ref Range Status   Specimen Description WOUND LEFT TOE  Final   Special Requests 5TH METATARSAL HEAD PT ON VANC CEFEPIME  Final   Gram Stain   Final    NO WBC SEEN RARE GRAM POSITIVE COCCI IN PAIRS RARE GRAM NEGATIVE RODS    Culture   Final    FEW STAPHYLOCOCCUS AUREUS SUSCEPTIBILITIES TO FOLLOW Performed at [facility], [address], [place], [place] [address]    Report Status PENDING  Incomplete    Radiology Studies: DG Foot Complete Left  Result Date: 10/31/2022 CLINICAL DATA:  Osteomyelitis of left foot. EXAM: LEFT FOOT - COMPLETE 3+ VIEW COMPARISON:  Radiograph yesterday FINDINGS: Interval resection of the distal fifth metatarsal. The resection margin is smooth. The fifth toe remains in place. Expected postsurgical change in the soft tissues. The exam is otherwise unchanged. IMPRESSION: Interval resection of the distal fifth metatarsal. Electronically Signed   By: Narda Rutherford M.D.   On: 10/31/2022 18:38   VAS [place] ABI WITH/WO TBI  Result Date: 10/31/2022  LOWER EXTREMITY DOPPLER STUDY Patient Name:  Elioenai Hinkle  Date of Exam:   10/31/2022 Medical Rec #: [id number]            Accession #:    [id number] Date of Birth: 27-Jul-1965             Patient Gender: M Patient Age:   72 years Exam Location:  [facility] Procedure:      VAS [place] ABI WITH/WO TBI Referring Phys: REBECCA SIKORA --------------------------------------------------------------------------------  Indications: Ulceration, and peripheral artery  disease. High Risk Factors: Hypertension, hyperlipidemia, Diabetes, current smoker.  Comparison Study: Previous 06/07/21 abnormal. Performing Technologist: McKayla Maag RVT, [place]  Examination Guidelines: A complete evaluation includes at minimum, Doppler waveform signals and systolic blood pressure reading at the level of bilateral brachial, anterior tibial, and posterior tibial arteries, when vessel segments are accessible. Bilateral testing is considered an integral part of a complete examination. Photoelectric Plethysmograph (PPG) waveforms and toe systolic pressure readings are included as required and additional duplex testing as needed. Limited examinations for reoccurring indications may be performed as noted.  ABI Findings: +---------+------------------+-----+----------+--------+ Right    Rt Pressure (mmHg)IndexWaveform  Comment  +---------+------------------+-----+----------+--------+ Brachial 152                    biphasic           +---------+------------------+-----+----------+--------+ PTA      91                0.60 monophasic         +---------+------------------+-----+----------+--------+ DP       96                0.63 monophasic         +---------+------------------+-----+----------+--------+ Great Toe39                0.26 Abnormal           +---------+------------------+-----+----------+--------+ +---------+------------------+-----+-------------------+-----------------------+ Left  Lt Pressure (mmHg)IndexWaveform           Comment                 +---------+------------------+-----+-------------------+-----------------------+ Brachial                        biphasic           No pressure obtained                                                       due to IV placement     +---------+------------------+-----+-------------------+-----------------------+ PTA      91                0.60 monophasic                                  +---------+------------------+-----+-------------------+-----------------------+ DP       74                0.49 dampened monophasic                        +---------+------------------+-----+-------------------+-----------------------+ Great Toe                       Absent                                     +---------+------------------+-----+-------------------+-----------------------+ +-------+-----------+-----------+------------+------------+ ABI/TBIToday's ABIToday's TBIPrevious ABIPrevious TBI +-------+-----------+-----------+------------+------------+ Right  0.63       0.26       0.59        0.22         +-------+-----------+-----------+------------+------------+ Left   0.60       Absent     0.74        0.38         +-------+-----------+-----------+------------+------------+ Right ABIs and TBIs appear essentially unchanged. Left ABIs and TBIs appear decreased.  Summary: Right: Resting right ankle-brachial index indicates moderate right lower extremity arterial disease. The right toe-brachial index is abnormal. Left: Resting left ankle-brachial index indicates moderate left lower extremity arterial disease. *See table(s) above for measurements and observations.  Electronically signed by Coral Else MD on 10/31/2022 at 5:48:53 PM.    Final    MR FOOT LEFT W WO CONTRAST  Result Date: 10/31/2022 CLINICAL DATA:  Left foot infection. EXAM: MRI OF THE LEFT FOREFOOT WITHOUT AND WITH CONTRAST TECHNIQUE: Multiplanar, multisequence MR imaging of the left forefoot was performed both before and after administration of intravenous contrast. CONTRAST:  7mL GADAVIST GADOBUTROL 1 MMOL/ML IV SOLN COMPARISON:  Left foot x-rays from same day. FINDINGS: Bones/Joint/Cartilage Mild marrow edema and enhancement involving the fifth metatarsal head, early loss of the normal T1 marrow signal, consistent with osteomyelitis. No fracture or dislocation. Mild osteoarthritis of the first MTP joint. No  joint effusion. Ligaments Collateral ligaments are intact. Muscles and Tendons Flexor and extensor tendons are intact. No tenosynovitis. Soft tissue Superficial ulceration at the lateral plantar aspect of the fifth metatarsal head with underlying lobulated T2 hyperintense, T1 isointense signal without rim enhancement, measuring approximately 4.7 x 0.7 x 2.1 cm. No soft tissue mass.  IMPRESSION: 1. Superficial ulceration at the lateral plantar aspect of the fifth metatarsal head with underlying early osteomyelitis of the fifth metatarsal head and 4.7 x 0.7 x 2.1 cm phlegmon/early abscess. Electronically Signed   By: Obie Dredge M.D.   On: 10/31/2022 09:55     Scheduled Meds:  aspirin EC  81 mg Oral Daily   enoxaparin (LOVENOX) injection  40 mg Subcutaneous Q24H   gemfibrozil  600 mg Oral BID AC   insulin aspart  0-5 Units Subcutaneous QHS   insulin aspart  0-9 Units Subcutaneous TID WC   melatonin  10 mg Oral QHS   nicotine  14 mg Transdermal Daily   Continuous Infusions:  sodium chloride     ceFEPime (MAXIPIME) IV 2 g (11/01/22 0428)   [START ON 11/02/2022] vancomycin      LOS: 2 days   Marguerita Merles, DO [facility] Available via Epic secure chat 7am-7pm After these hours, please refer to coverage provider listed on [email or web address] 11/01/2022, 1:54 PM

## 2022-11-01 NOTE — Progress Notes (Signed)
Pharmacy Antibiotic Note  Kenneth Bailey is a 57 y.o. male admitted on 10/30/2022 with osteomyelitis.  Pharmacy has been consulted for vancomycin and cefepime dosing.  SCr has improved though not quite at baseline.  Random vanc level lower than expected.  Plan: Vancomycin 1g IV x1 this am; consider scheduled dosing as AKI resolves. Change cefepime to 2g IV Q12H.  Height: 5\' 8"  (172.7 cm) Weight: 72.5 kg (159 lb 12.4 oz) IBW/kg (Calculated) : 68.4  Temp (24hrs), Avg:98.3 F (36.8 C), Min:97.9 F (36.6 C), Max:98.4 F (36.9 C)  Recent Labs  Lab 10/30/22 1658 10/30/22 2005 10/31/22 0450 11/01/22 0059  WBC 21.2*  --  14.2* 16.5*  CREATININE 2.91*  --  2.01* 1.47*  LATICACIDVEN 2.4* 1.2  --   --   VANCORANDOM  --   --   --  8    Estimated Creatinine Clearance: 54.9 mL/min (A) (by C-G formula based on SCr of 1.47 mg/dL (H)).    Allergies  Allergen Reactions   Lisinopril     Leg swelling   Statins     Leg swelling     Thank you for allowing pharmacy to be a part of this patient's care.  Vernard Gambles, PharmD, BCPS  11/01/2022 3:39 AM

## 2022-11-01 NOTE — Progress Notes (Signed)
PT Cancellation Note  Patient Details Name: Kenneth Bailey MRN: [id number] DOB: 1965-10-19   Cancelled Treatment:    Reason Eval/Treat Not Completed: Pain limiting ability to participate.  Will follow up with pt as time and pt allow.  Nursing notified about pain meds.   Ivar Drape 11/01/2022, 12:31 PM  Samul Dada, PT PhD Acute Rehab Dept. Number: [facility] [id number] and [facility] [phone]

## 2022-11-01 NOTE — Plan of Care (Signed)
  Problem: Education: Goal: Knowledge of General Education information will improve Description: Including pain rating scale, medication(s)/side effects and non-pharmacologic comfort measures Outcome: Progressing   Problem: Health Behavior/Discharge Planning: Goal: Ability to manage health-related needs will improve Outcome: Progressing   Problem: Clinical Measurements: Goal: Ability to maintain clinical measurements within normal limits will improve Outcome: Progressing Goal: Will remain free from infection Outcome: Progressing   Problem: Activity: Goal: Risk for activity intolerance will decrease Outcome: Progressing   Problem: Pain Managment: Goal: General experience of comfort will improve Outcome: Progressing   Problem: Safety: Goal: Ability to remain free from injury will improve Outcome: Progressing   

## 2022-11-01 NOTE — Evaluation (Signed)
Occupational Therapy Evaluation Patient Details Name: Kenneth Bailey MRN: [id number] DOB: 1966/03/20 Today's Date: 11/01/2022   History of Present Illness 57 y.o. male admitted on 10/30/2022 with osteomyelitis. He is now s/p Left foot irrigation and debridement with resection of fifth metatarsal head 11/01/22. Past medical history significant for uncontrolled type 2 diabetes, diabetic polyneuropathy, coronary artery disease status post CABG, chronic combined diastolic and systolic CHF, current smoker,   Clinical Impression   Pt admitted for above dx, PTA patient lived alone and reports being ind in ADLs and ambulated without AD. Pt currently presenting with LLE pain and tenderness which impacts his balance and limits his activity tolerance. STS min guard with cues to lift toes off ground to keep weight on heel but Pt with so much pain he could only stand on his heel for 15 seconds before needing to sit. Pt completing dressing with supervision at bedside and demonstrated carryover of donning post op shoe without cues. Pt would benefit from continued acute skilled OT services to address above deficits and progress activity tolerance to complete bADLs/iADLs. No follow-up OT recommended pending pt progression with pain tolerance and mobility.      Recommendations for follow up therapy are one component of a multi-disciplinary discharge planning process, led by the attending physician.  Recommendations may be updated based on patient status, additional functional criteria and insurance authorization.   Assistance Recommended at Discharge Intermittent Supervision/Assistance  Patient can return home with the following A little help with walking and/or transfers;Assistance with cooking/housework;A little help with bathing/dressing/bathroom;Assist for transportation;Help with stairs or ramp for entrance    Functional Status Assessment  Patient has had a recent decline in their functional status and  demonstrates the ability to make significant improvements in function in a reasonable and predictable amount of time.  Equipment Recommendations  None recommended by OT (pending pt progression with pain tolerance and adherence to precautions)    Recommendations for Other Services       Precautions / Restrictions Precautions Precautions: Fall Precaution Comments: pt is on IV meds for pain Required Braces or Orthoses: Other Brace Other Brace: post op shoe Restrictions Weight Bearing Restrictions: Yes LLE Weight Bearing:  (heel WB only in post op shoe LLE)      Mobility Bed Mobility Overal bed mobility: Needs Assistance Bed Mobility: Supine to Sit, Sit to Supine     Supine to sit: Supervision Sit to supine: Min guard        Transfers Overall transfer level: Needs assistance Equipment used: None Transfers: Sit to/from Stand Sit to Stand: Min guard           General transfer comment: Pt unable to stand for longer than 15 seconds      Balance Overall balance assessment: Needs assistance Sitting-balance support: Feet supported Sitting balance-Leahy Scale: Fair     Standing balance support: Bilateral upper extremity supported, During functional activity Standing balance-Leahy Scale: Fair                             ADL either performed or assessed with clinical judgement   ADL Overall ADL's : Needs assistance/impaired Eating/Feeding: Independent;Sitting   Grooming: Sitting;Set up   Upper Body Bathing: Sitting;Independent;Set up   Lower Body Bathing: Sitting/lateral leans;Supervison/ safety   Upper Body Dressing : Sitting;Independent   Lower Body Dressing: Sitting/lateral leans;Supervision/safety;Set up Lower Body Dressing Details (indicate cue type and reason): no challenges with donning shoe, pt doffing/donning draco shoe  without cueing   Toilet Transfer Details (indicate cue type and reason): NT       Tub/Shower Transfer Details (indicate  cue type and reason): NT Functional mobility during ADLs: Min guard (stood at bedside) General ADL Comments: Pt stood at bedside but unable to stand long or take steps due to tenderness of LLE heel     Vision         Perception     Praxis      Pertinent Vitals/Pain Pain Assessment Pain Assessment: 0-10 Pain Score: 10-Worst pain ever Pain Location: L heel with pressure Pain Descriptors / Indicators: Grimacing, Guarding, Tender, Sore Pain Intervention(s): Monitored during session, Repositioned, Limited activity within patient's tolerance     Hand Dominance Right   Extremity/Trunk Assessment Upper Extremity Assessment Upper Extremity Assessment: Overall WFL for tasks assessed   Lower Extremity Assessment Lower Extremity Assessment: LLE deficits/detail LLE Deficits / Details: 5th toe removal of metatarsal head LLE: Unable to fully assess due to immobilization LLE Coordination: decreased gross motor   Cervical / Trunk Assessment Cervical / Trunk Assessment: Normal   Communication Communication Communication: No difficulties   Cognition Arousal/Alertness: Awake/alert Behavior During Therapy: WFL for tasks assessed/performed Overall Cognitive Status: Within Functional Limits for tasks assessed                                       General Comments  Pt reporting nausea that has persisted on and off throughout the day, Pt BP140/81 checked seated EOB and no c/o symptoms from patient    Exercises     Shoulder Instructions      Home Living Family/patient expects to be discharged to:: Private residence Living Arrangements: Alone Available Help at Discharge: Family;Friend(s);Available PRN/intermittently Type of Home: Mobile home Home Access: Stairs to enter Entrance Stairs-Number of Steps: 13 Entrance Stairs-Rails: Left Home Layout: One level     Bathroom Shower/Tub: Chief Strategy Officer: Standard     Home Equipment: None    Additional Comments: pt is driving and fully I at home      Prior Functioning/Environment Prior Level of Function : Independent/Modified Independent;Driving             Mobility Comments: no limits, no AD needed ADLs Comments: ind        OT Problem List: Decreased activity tolerance;Impaired balance (sitting and/or standing);Pain      OT Treatment/Interventions: Self-care/ADL training;Balance training;DME and/or AE instruction;Patient/family education;Therapeutic activities    OT Goals(Current goals can be found in the care plan section) Acute Rehab OT Goals Patient Stated Goal: To go home OT Goal Formulation: With patient Time For Goal Achievement: 11/15/22 Potential to Achieve Goals: Good ADL Goals Pt Will Perform Grooming: standing;with min guard assist Pt Will Transfer to Toilet: ambulating;with min guard assist Pt Will Perform Tub/Shower Transfer: ambulating;with min guard assist (using tub bench if needed) Additional ADL Goal #1: Pt will demonstrate 1 min of standing tolerance with min guard assist in preparation for OOB ADLs.  OT Frequency: Min 2X/week    Co-evaluation              AM-PAC OT "6 Clicks" Daily Activity     Outcome Measure Help from another person eating meals?: None Help from another person taking care of personal grooming?: A Little Help from another person toileting, which includes using toliet, bedpan, or urinal?: A Little Help from another person bathing (including  washing, rinsing, drying)?: A Little Help from another person to put on and taking off regular upper body clothing?: None Help from another person to put on and taking off regular lower body clothing?: A Little 6 Click Score: 20   End of Session Equipment Utilized During Treatment: Gait belt Nurse Communication: Mobility status  Activity Tolerance: Patient limited by pain Patient left: in bed;with call bell/phone within reach  OT Visit Diagnosis: Unsteadiness on feet  (R26.81);Pain Pain - Right/Left: Left Pain - part of body: Ankle and joints of foot                Time: 1732-1746 OT Time Calculation (min): 14 min Charges:  OT General Charges $OT Visit: 1 Visit OT Evaluation $OT Eval Moderate Complexity: 1 Mod  11/01/2022  AB, OTR/L  Acute Rehabilitation Services  Office: [phone]   Tristan Schroeder 11/01/2022, 6:06 PM

## 2022-11-01 NOTE — TOC Progression Note (Signed)
Transition of Care [facility]) - Progression Note    Patient Details  Name: Kenneth Bailey MRN: [id number] Date of Birth: [birth year]  Transition of Care [facility] CM/SW Contact  Epifanio Lesches, RN Phone Number: 11/01/2022, 3:08 PM  Clinical Narrative:       - s/p Left foot irrigation and debridement with resection of fifth metatarsal head,6/11  PT/OT evaluations pending....   TOC team following for needs.  Expected Discharge Plan: Home/Self Care (vs home health services) Barriers to Discharge: Continued Medical Work up  Expected Discharge Plan and Services   Discharge Planning Services: CM Consult   Living arrangements for the past 2 months: Mobile Home                                       Social Determinants of Health (SDOH) Interventions SDOH Screenings   Food Insecurity: No Food Insecurity (10/30/2022)  Housing: Low Risk  (10/30/2022)  Transportation Needs: No Transportation Needs (10/30/2022)  Utilities: Not At Risk (10/30/2022)  Financial Resource Strain: High Risk (01/09/2022)  Tobacco Use: High Risk (11/01/2022)    Readmission Risk Interventions     No data to display

## 2022-11-02 ENCOUNTER — Other Ambulatory Visit (HOSPITAL_COMMUNITY): Payer: Self-pay

## 2022-11-02 DIAGNOSIS — T148XXD Other injury of unspecified body region, subsequent encounter: Secondary | ICD-10-CM | POA: Diagnosis not present

## 2022-11-02 DIAGNOSIS — M86172 Other acute osteomyelitis, left ankle and foot: Secondary | ICD-10-CM

## 2022-11-02 DIAGNOSIS — L02612 Cutaneous abscess of left foot: Secondary | ICD-10-CM | POA: Diagnosis not present

## 2022-11-02 LAB — CBC WITH DIFFERENTIAL/PLATELET
Abs Immature Granulocytes: 0.09 10*3/uL — ABNORMAL HIGH (ref 0.00–0.07)
Basophils Absolute: 0.1 10*3/uL (ref 0.0–0.1)
Basophils Relative: 1 %
Eosinophils Absolute: 0.1 10*3/uL (ref 0.0–0.5)
Eosinophils Relative: 1 %
HCT: 34.8 % — ABNORMAL LOW (ref 39.0–52.0)
Hemoglobin: 11.4 g/dL — ABNORMAL LOW (ref 13.0–17.0)
Immature Granulocytes: 1 %
Lymphocytes Relative: 11 %
Lymphs Abs: 1.4 10*3/uL (ref 0.7–4.0)
MCH: 27.7 pg (ref 26.0–34.0)
MCHC: 32.8 g/dL (ref 30.0–36.0)
MCV: 84.5 fL (ref 80.0–100.0)
Monocytes Absolute: 1 10*3/uL (ref 0.1–1.0)
Monocytes Relative: 7 %
Neutro Abs: 10.5 10*3/uL — ABNORMAL HIGH (ref 1.7–7.7)
Neutrophils Relative %: 79 %
Platelets: 306 10*3/uL (ref 150–400)
RBC: 4.12 MIL/uL — ABNORMAL LOW (ref 4.22–5.81)
RDW: 13.9 % (ref 11.5–15.5)
WBC: 13.2 10*3/uL — ABNORMAL HIGH (ref 4.0–10.5)
nRBC: 0 % (ref 0.0–0.2)

## 2022-11-02 LAB — AEROBIC/ANAEROBIC CULTURE W GRAM STAIN (SURGICAL/DEEP WOUND): Gram Stain: NONE SEEN

## 2022-11-02 LAB — COMPREHENSIVE METABOLIC PANEL
ALT: 10 U/L (ref 0–44)
AST: 13 U/L — ABNORMAL LOW (ref 15–41)
Albumin: 2.9 g/dL — ABNORMAL LOW (ref 3.5–5.0)
Alkaline Phosphatase: 63 U/L (ref 38–126)
Anion gap: 12 (ref 5–15)
BUN: 37 mg/dL — ABNORMAL HIGH (ref 6–20)
CO2: 24 mmol/L (ref 22–32)
Calcium: 9.6 mg/dL (ref 8.9–10.3)
Chloride: 97 mmol/L — ABNORMAL LOW (ref 98–111)
Creatinine, Ser: 1.09 mg/dL (ref 0.61–1.24)
GFR, Estimated: >60 mL/min (ref >60)
Glucose, Bld: 153 mg/dL — ABNORMAL HIGH (ref 70–99)
Potassium: 4.8 mmol/L (ref 3.5–5.1)
Sodium: 133 mmol/L — ABNORMAL LOW (ref 135–145)
Total Bilirubin: 0.5 mg/dL (ref 0.3–1.2)
Total Protein: 6.9 g/dL (ref 6.5–8.1)

## 2022-11-02 LAB — CULTURE, BLOOD (ROUTINE X 2)
Culture: NO GROWTH
Culture: NO GROWTH

## 2022-11-02 LAB — GLUCOSE, CAPILLARY
Glucose-Capillary: 151 mg/dL — ABNORMAL HIGH (ref 70–99)
Glucose-Capillary: 270 mg/dL — ABNORMAL HIGH (ref 70–99)
Glucose-Capillary: 129 mg/dL — ABNORMAL HIGH (ref 70–99)
Glucose-Capillary: 176 mg/dL — ABNORMAL HIGH (ref 70–99)

## 2022-11-02 LAB — SURGICAL PATHOLOGY

## 2022-11-02 LAB — PHOSPHORUS: Phosphorus: 3 mg/dL (ref 2.5–4.6)

## 2022-11-02 LAB — MAGNESIUM: Magnesium: 1.8 mg/dL (ref 1.7–2.4)

## 2022-11-02 MED ORDER — VANCOMYCIN HCL 1500 MG/300ML IV SOLN
1500.0000 mg | INTRAVENOUS | Status: DC
  Administered 2022-11-03: 1500 mg via INTRAVENOUS
  Filled 2022-11-02: qty 300

## 2022-11-02 MED ORDER — SODIUM CHLORIDE 0.9 % IV SOLN
2.0000 g | Freq: Three times a day (TID) | INTRAVENOUS | Status: DC
  Administered 2022-11-02 – 2022-11-03 (×2): 2 g via INTRAVENOUS
  Filled 2022-11-02 (×2): qty 12.5

## 2022-11-02 NOTE — Progress Notes (Signed)
PROGRESS NOTE    Kenneth Bailey  ZOX:[id number] DOB: 06-03-1965 DOA: 10/30/2022 PCP: [name], MD   Brief Narrative:  The patient is a 57 year old chronically ill-appearing Caucasian male with a past medical history significant for but not limited to uncontrolled diabetes mellitus type 2, CAD status post CABG, chronic combined systolic and diastolic CHF, tobacco abuse as well as other comorbidities who presented to [facility] emergency department at the recommendation of his podiatrist given his persistent left foot pain and concern for osteomyelitis of the left fifth toe.  He has been suffering with a left foot diabetic ulcer for approximately 4 weeks and will prescribe a course of antibiotics through the hospital hospital ED and was referred to podiatry who did follow him for approximate 1 week.  Imaging was done and the podiatrist evaluated and there is finding concerns for osteomyelitis.  Patient was contacted and instructed to go to [name] ED.  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    He was taken to the OR and is status post left foot incision and drainage with resection of fifth metatarsal head on 6/11 with cultures currently growing MRSA.  ID has been consulted for further evaluation recommendations. Given proximal incision necrosis, Podiatry recommends Vascular Consult for evaluation for wound healing and Vascular consulted and recommending obtaining an angiogram of the left lower leg with possible intervention through transfemoral access given that they suspect that he has tibial disease.  Assessment and Plan:  Left Fifth Toe and Lateral Foot Cellulitis with Osteomyelitis -Continue empiric Antibiotics with IV Cefepime q grams q12h and IV Vancomycin -ABIs done and showed  "Right: Resting right  ankle-brachial index indicates moderate right lower  extremity arterial disease. The right toe-brachial index is abnormal. 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, [place], [place] [address]  Culture FEW METHICILLIN RESISTANT STAPHYLOCOCCUS AUREUS NO ANAEROBES ISOLATED; CULTURE IN PROGRESS FOR 5 DAYS  Report Status PENDING  Organism ID, Bacteria METHICILLIN RESISTANT STAPHYLOCOCCUS AUREUS  Resulting Agency [facility]     Susceptibility   Methicillin resistant staphylococcus aureus    MIC    CIPROFLOXACIN >=8 RESISTANT Resistant    CLINDAMYCIN >=8 RESISTANT Resistant    ERYTHROMYCIN >=8 RESISTANT Resistant    GENTAMICIN <=0.5 SENSI... Sensitive    Inducible Clindamycin NEGATIVE Sensitive    LINEZOLID 2 SENSITIVE Sensitive    OXACILLIN >=4 RESISTANT Resistant    RIFAMPIN <=0.5 SENSI... Sensitive    TETRACYCLINE <=1 SENSITIVE Sensitive    TRIMETH/SULFA 160 RESISTANT Resistant    VANCOMYCIN <=0.5 SENSI... Sensitive         -  Continue with po Oxycodone 5 mg po q6hprn Moderate/Breakthrough Pain and IV Hydromorphone 0.5 mg IV q4hprn Severe Pain  -Consulted ID for further evaluation and recommendations given his Infection and Resection however  podiatry feels that there is surgical cure of his bone infection; ID recommends at least a 4-week treatment but can consider giving a long-acting prior to discharge such as Dalvance or about advancing but recommending continuing on IV cefepime and IV vancomycin for the moment and following up on cultures -Podiatry is recommending heel weightbearing in the postoperative shoe -PT OT consulted however patient's pain limited participation -Patient now has some necrosis of the proximal incision the podiatrist has recommended obtaining a vascular consultation given his concern for wound healing and vascular Dr. Chestine Spore been consulted -Vascular planning a abdominal aortogram with left lower extremity runoff and possible intervention with Dr. Lenell Antu tomorrow   Acute Kidney Injury -Likely prerenal in setting of infection with poor intake -baseline creatinine approximately 1.3 with creatinine 2.9 at presentation  -BUN/Cr Trend: Recent Labs  Lab 10/30/22 1658 10/31/22 0450 11/01/22 0059 11/02/22 0332  BUN 48* 49* 53* 37*  CREATININE 2.91* 2.01* 1.47* 1.09  -IVF now to stop  -Avoid Nephrotoxic Medications, Contrast Dyes, Hypotension and Dehydration to Ensure Adequate Renal Perfusion and will need to Renally Adjust Meds -Continue to Monitor and Trend Renal Function carefully and repeat CMP in the AM   Chronic Combined Systolic and Diastolic CHF -Euvolemic/somewhat dehydrated at presentation -follow with gentle volume expansion  Intake/Output Summary (Last 24 hours) at 11/02/2022 1614 Last data filed at 11/02/2022 1610 Gross per 24 hour  Intake --  Output 1700 ml  Net -1700 ml  -Strict I's and O's and Daily Weights -Has been getting normal saline at 50 MLS per hour for 2 days. -Continue to Monitor for Signs and Symptoms of Volume Overload   Tobacco Abuse -Has been counseled on the absolute need to discontinue tobacco completely -Continue with nicotine patch 14 mg transdermal every 24  hours  HLD -C/w Gemfibrozil 600 mg po BID    CAD status post CABG -Asymptomatic at present -Continue with aspirin 81 mg p.o. daily   Uncontrolled DM2 -CBG controlled as an inpatient  -Recent A1c was 10.7 -C/w Diabetic education -Continue to Monitor CBGs per protocol; CBG Trend: Recent Labs  Lab 11/01/22 0811 11/01/22 1128 11/01/22 1603 11/01/22 2002 11/02/22 0813 11/02/22 1112 11/02/22 1559  GLUCAP 198* 218* 211* 173* 151* 270* 129*   Normocytic Anemia -Hgb/Hct Trend: Recent Labs  Lab 10/30/22 1658 10/31/22 0450 11/01/22 0059 11/02/22 0332  HGB 13.8 12.0* 12.0* 11.4*  HCT 43.0 36.9* 36.9* 34.8*  MCV 83.7 85.0 83.9 84.5  -Check Anemia Panel in the AM -Continue to Monitor for S/Sx of Bleeding; No overt bleeding noted -Repeat CBC in the AM   DVT prophylaxis: enoxaparin (LOVENOX) injection 40 mg Start: 11/01/22 1000 SCDs Start: 10/30/22 2211    Code Status: Full Code Family Communication: No family currently at bedside  Disposition Plan:  Level of care: Med-Surg Status is: Inpatient Remains inpatient appropriate because: His further vascular evaluation given his necrosis of his proximal incision and will be undergoing a full vascular workup with an angiogram of the focus on the left leg with possible intervention  Consultants:  Podiatry Infectious Diseases Vascular Surgery  Procedures:   POD #1 s/p fifth metatarsal head resection and irrigation and debridement   Antimicrobials:  Anti-infectives (From admission, onward)    Start     Dose/Rate Route Frequency Ordered Stop  11/03/22 0500  vancomycin (VANCOREADY) IVPB 1500 mg/300 mL        1,500 mg 150 mL/hr over 120 Minutes Intravenous Every 24 hours 11/02/22 1439     11/02/22 1445  ceFEPIme (MAXIPIME) 2 g in sodium chloride 0.9 % 100 mL IVPB        2 g 200 mL/hr over 30 Minutes Intravenous Every 8 hours 11/02/22 1437     11/02/22 0500  vancomycin (VANCOREADY) IVPB 1250 mg/250 mL  Status:  Discontinued         1,250 mg 166.7 mL/hr over 90 Minutes Intravenous Every 24 hours 11/01/22 0721 11/02/22 1439   11/01/22 0430  vancomycin (VANCOCIN) IVPB 1000 mg/200 mL premix        1,000 mg 200 mL/hr over 60 Minutes Intravenous  Once 11/01/22 0339 11/01/22 0620   11/01/22 0430  ceFEPIme (MAXIPIME) 2 g in sodium chloride 0.9 % 100 mL IVPB  Status:  Discontinued        2 g 200 mL/hr over 30 Minutes Intravenous Every 12 hours 11/01/22 0342 11/02/22 1437   11/01/22 0400  ceFEPIme (MAXIPIME) 2 g in sodium chloride 0.9 % 100 mL IVPB  Status:  Discontinued        2 g 200 mL/hr over 30 Minutes Intravenous Every 24 hours 10/31/22 0342 11/01/22 0341   10/31/22 0430  ceFEPIme (MAXIPIME) 2 g in sodium chloride 0.9 % 100 mL IVPB        2 g 200 mL/hr over 30 Minutes Intravenous  Once 10/31/22 0342 10/31/22 0434   10/30/22 2138  vancomycin variable dose per unstable renal function (pharmacist dosing)  Status:  Discontinued         Does not apply See admin instructions 10/30/22 2138 11/01/22 0721   10/30/22 1745  vancomycin (VANCOREADY) IVPB 1500 mg/300 mL        1,500 mg 150 mL/hr over 120 Minutes Intravenous  Once 10/30/22 1743 10/30/22 2058       Subjective: Seen and examined at bedside continues to complain of significant pain in his foot still.  Denies any chest pain or shortness of breath but had some nausea earlier.  No other concerns or complaints at this time.  Objective: Vitals:   11/02/22 0500 11/02/22 0553 11/02/22 0810 11/02/22 1224  BP:  138/83 124/72 (!) 118/105  Pulse:  79 73 86  Resp:  14 18 18   Temp:  98.2 F (36.8 C) 98 F (36.7 C) 98.1 F (36.7 C)  TempSrc:  Oral  Oral  SpO2:  97% 100% 100%  Weight: 71.3 kg     Height:        Intake/Output Summary (Last 24 hours) at 11/02/2022 1620 Last data filed at 11/02/2022 1610 Gross per 24 hour  Intake --  Output 1700 ml  Net -1700 ml   Filed Weights   10/31/22 1226 11/01/22 1151 11/02/22 0500  Weight: 72.5 kg 70.9 kg 71.3 kg    Examination: Physical Exam:  Constitutional: Chronically ill-appearing older than his stated age who appears a little uncomfortable complaining of some pain Respiratory: Diminished to auscultation bilaterally, no wheezing, rales, rhonchi or crackles. Normal respiratory effort and patient is not tachypenic. No accessory muscle use.  Unlabored breathing Cardiovascular: RRR, no murmurs / rubs / gallops. S1 and S2 auscultated.  Left foot is wrapped Abdomen: Soft, non-tender, non-distended. Bowel sounds positive.  GU: Deferred. Musculoskeletal: No clubbing / cyanosis of digits/nails.  Has a left fifth metatarsal head that is been resected Skin: No rashes,  lesions, ulcers on limited skin evaluation. No induration; Warm and dry.  Neurologic: CN 2-12 grossly intact with no focal deficits.  Romberg sign and cerebellar reflexes not assessed.  Psychiatric: Normal judgment and insight. Alert and oriented x 3. Normal mood and appropriate affect.   Data Reviewed: I have personally reviewed following labs and imaging studies  CBC: Recent Labs  Lab 10/30/22 1658 10/31/22 0450 11/01/22 0059 11/02/22 0332  WBC 21.2* 14.2* 16.5* 13.2*  NEUTROABS  --   --   --  10.5*  HGB 13.8 12.0* 12.0* 11.4*  HCT 43.0 36.9* 36.9* 34.8*  MCV 83.7 85.0 83.9 84.5  PLT 402* 290 353 306   Basic Metabolic Panel: Recent Labs  Lab 10/30/22 1658 10/31/22 0450 11/01/22 0059 11/02/22 0332  NA 135 137 135 133*  K 4.7 3.8 4.6 4.8  CL 95* 100 97* 97*  CO2 21* 22 22 24   GLUCOSE 183* 178* 152* 153*  BUN 48* 49* 53* 37*  CREATININE 2.91* 2.01* 1.47* 1.09  CALCIUM 10.2 9.4 9.6 9.6  MG  --  2.0 2.2 1.8  PHOS  --  5.0*  --  3.0   GFR: Estimated Creatinine Clearance: 74.1 mL/min (by C-G formula based on SCr of 1.09 mg/dL). Liver Function Tests: Recent Labs  Lab 11/02/22 0332  AST 13*  ALT 10  ALKPHOS 63  BILITOT 0.5  PROT 6.9  ALBUMIN 2.9*   No results for input(s): "LIPASE", "AMYLASE" in the last 168  hours. No results for input(s): "AMMONIA" in the last 168 hours. Coagulation Profile: No results for input(s): "INR", "PROTIME" in the last 168 hours. Cardiac Enzymes: No results for input(s): "CKTOTAL", "CKMB", "CKMBINDEX", "TROPONINI" in the last 168 hours. BNP (last 3 results) No results for input(s): "PROBNP" in the last 8760 hours. HbA1C: No results for input(s): "HGBA1C" in the last 72 hours. CBG: Recent Labs  Lab 11/01/22 1603 11/01/22 2002 11/02/22 0813 11/02/22 1112 11/02/22 1559  GLUCAP 211* 173* 151* 270* 129*   Lipid Profile: No results for input(s): "CHOL", "HDL", "LDLCALC", "TRIG", "CHOLHDL", "LDLDIRECT" in the last 72 hours. Thyroid Function Tests: No results for input(s): "TSH", "T4TOTAL", "FREET4", "T3FREE", "THYROIDAB" in the last 72 hours. Anemia Panel: No results for input(s): "VITAMINB12", "FOLATE", "FERRITIN", "TIBC", "IRON", "RETICCTPCT" in the last 72 hours. Sepsis Labs: Recent Labs  Lab 10/30/22 1658 10/30/22 2005  LATICACIDVEN 2.4* 1.2   Recent Results (from the past 240 hour(s))  WOUND CULTURE     Status: Abnormal   Collection Time: 10/25/22  4:22 PM   Specimen: Foot, Left; Wound  Result Value Ref Range Status   MICRO NUMBER: [id number]  Final   SPECIMEN QUALITY: Adequate  Final   SOURCE: WOUND (SITE NOT SPECIFIED)  Final   STATUS: FINAL  Final   GRAM STAIN:   Final    No white blood cells seen No epithelial cells seen Moderate Gram positive cocci in pairs   RESULT:   Final    Additional organisms of questionable significance were isolated that normally do not warrant identification and susceptibilities. Please contact the laboratory within three days if identification and susceptibilities are clinically indicated.   ISOLATE 1: methicillin resistant Staphylococcus aureus (A)  Final    Comment: Moderate growth of Methicillin resistant Staphylococcus aureus (MRSA)      Susceptibility   Methicillin resistant staphylococcus aureus - AEROBIC CULT,  GRAM STAIN POSITIVE 1    VANCOMYCIN 1 Sensitive     CIPROFLOXACIN >=8 Resistant     CLINDAMYCIN >=8 Resistant  LEVOFLOXACIN 4 Intermediate     ERYTHROMYCIN >=8 Resistant     GENTAMICIN <=0.5 Sensitive     OXACILLIN* NR Resistant      * Oxacillin-resistant staphylococci are resistant to all currently available beta-lactam antimicrobial agents with the possible exception of ceftaroline.     TETRACYCLINE <=1 Sensitive     TRIMETH/SULFA* >=320 Resistant      * Oxacillin-resistant staphylococci are resistant to all currently available beta-lactam antimicrobial agents with the possible exception of ceftaroline. Legend: S = Susceptible  I = Intermediate R = Resistant  NS = Not susceptible * = Not tested  NR = Not reported **NN = See antimicrobic comments   Blood culture (routine x 2)     Status: None (Preliminary result)   Collection Time: 10/30/22  4:58 PM   Specimen: BLOOD  Result Value Ref Range Status   Specimen Description BLOOD LEFT FOREARM  Final   Special Requests   Final    BOTTLES DRAWN AEROBIC AND ANAEROBIC Blood Culture adequate volume   Culture   Final    NO GROWTH 3 DAYS Performed at [facility], [address], [place], [place] [address]    Report Status PENDING  Incomplete  Blood culture (routine x 2)     Status: None (Preliminary result)   Collection Time: 10/30/22  5:11 PM   Specimen: BLOOD  Result Value Ref Range Status   Specimen Description BLOOD RIGHT ANTECUBITAL  Final   Special Requests   Final    BOTTLES DRAWN AEROBIC AND ANAEROBIC Blood Culture results may not be optimal due to an excessive volume of blood received in culture bottles   Culture   Final    NO GROWTH 3 DAYS Performed at [facility], [address], [place], [place] [address]    Report Status PENDING  Incomplete  MRSA Next Gen by PCR, Nasal     Status: None   Collection Time: 10/31/22  3:44 AM   Specimen: Nasal Mucosa; Nasal Swab  Result Value Ref Range Status   MRSA  by PCR Next Gen NOT DETECTED NOT DETECTED Final    Comment: (NOTE) The GeneXpert MRSA Assay (FDA approved for NASAL specimens only), is one component of a comprehensive MRSA colonization surveillance program. It is not intended to diagnose MRSA infection nor to guide or monitor treatment for MRSA infections. Test performance is not FDA approved in patients less than 62 years old. Performed at [facility], [address], [place], [place] [address]   Aerobic/Anaerobic Culture w Gram Stain (surgical/deep wound)     Status: None (Preliminary result)   Collection Time: 10/31/22  1:24 PM   Specimen: Toe, Left; Amputation  Result Value Ref Range Status   Specimen Description WOUND LEFT TOE  Final   Special Requests 5TH METATARSAL HEAD PT ON VANC CEFEPIME  Final   Gram Stain   Final    NO WBC SEEN RARE GRAM POSITIVE COCCI IN PAIRS RARE GRAM NEGATIVE RODS Performed at [facility], [address], [place], [place] [address]    Culture   Final    FEW METHICILLIN RESISTANT STAPHYLOCOCCUS AUREUS NO ANAEROBES ISOLATED; CULTURE IN PROGRESS FOR 5 DAYS    Report Status PENDING  Incomplete   Organism ID, Bacteria METHICILLIN RESISTANT STAPHYLOCOCCUS AUREUS  Final      Susceptibility   Methicillin resistant staphylococcus aureus - MIC*    CIPROFLOXACIN >=8 RESISTANT Resistant     ERYTHROMYCIN >=8 RESISTANT Resistant  GENTAMICIN <=0.5 SENSITIVE Sensitive     OXACILLIN >=4 RESISTANT Resistant     TETRACYCLINE <=1 SENSITIVE Sensitive     VANCOMYCIN <=0.5 SENSITIVE Sensitive     TRIMETH/SULFA 160 RESISTANT Resistant     CLINDAMYCIN >=8 RESISTANT Resistant     RIFAMPIN <=0.5 SENSITIVE Sensitive     Inducible Clindamycin NEGATIVE Sensitive     LINEZOLID 2 SENSITIVE Sensitive     * FEW METHICILLIN RESISTANT STAPHYLOCOCCUS AUREUS    Radiology Studies: No results found.  Scheduled Meds:  aspirin EC  81 mg Oral Daily   enoxaparin (LOVENOX) injection  40 mg Subcutaneous Q24H    gemfibrozil  600 mg Oral BID AC   insulin aspart  0-5 Units Subcutaneous QHS   insulin aspart  0-9 Units Subcutaneous TID WC   melatonin  10 mg Oral QHS   nicotine  14 mg Transdermal Daily   Continuous Infusions:  ceFEPime (MAXIPIME) IV     [START ON 11/03/2022] vancomycin      LOS: 3 days   Marguerita Merles, DO [facility] Available via Epic secure chat 7am-7pm After these hours, please refer to coverage provider listed on [email or web address] 11/02/2022, 4:20 PM

## 2022-11-02 NOTE — Progress Notes (Signed)
PT Cancellation Note  Patient Details Name: Berwyn Bigley MRN: [id number] DOB: 1966/03/18   Cancelled Treatment:    Reason Eval/Treat Not Completed: (P) Pain limiting ability to participate, pt declining all mobility with c/o pain with pt requesting pain meds, RN notified. Will check back as schedule allows to continue with PT POC.  Lenora Boys. PTA Acute Rehabilitation Services Office: [phone]    Catalina Antigua 11/02/2022, 4:06 PM

## 2022-11-02 NOTE — Progress Notes (Signed)
Occupational Therapy Treatment Patient Details Name: Kenneth Bailey MRN: [id number] DOB: 1966/05/19 Today's Date: 11/02/2022   History of present illness 57 y.o. male admitted on 10/30/2022 with osteomyelitis. He is now s/p Left foot irrigation and debridement with resection of fifth metatarsal head 11/01/22. Past medical history significant for uncontrolled type 2 diabetes, diabetic polyneuropathy, coronary artery disease status post CABG, chronic combined diastolic and systolic CHF, current smoker,   OT comments  Patient making progress with donning shoes seated on EOB with supervision, patient able to ambulate to bathroom and perform toilet transfer with RW and min guard assist and able to perform toilet hygiene with Mod I seated. Patient also able to tolerate >1 of static standing but declined standing at sink for grooming. Patient continues to be limited by pain. Acute OT to continue to follow.    Recommendations for follow up therapy are one component of a multi-disciplinary discharge planning process, led by the attending physician.  Recommendations may be updated based on patient status, additional functional criteria and insurance authorization.    Assistance Recommended at Discharge Intermittent Supervision/Assistance  Patient can return home with the following  A little help with walking and/or transfers;Assistance with cooking/housework;A little help with bathing/dressing/bathroom;Assist for transportation;Help with stairs or ramp for entrance   Equipment Recommendations  None recommended by OT    Recommendations for Other Services      Precautions / Restrictions Precautions Precautions: Fall Precaution Comments: pt is on IV meds for pain Required Braces or Orthoses: Other Brace Other Brace: post op shoe Restrictions Weight Bearing Restrictions: Yes LLE Weight Bearing:  (WB through heel only)       Mobility Bed Mobility Overal bed mobility: Needs Assistance Bed  Mobility: Supine to Sit, Sit to Supine     Supine to sit: Supervision Sit to supine: Supervision   General bed mobility comments: no physical assistance needed    Transfers Overall transfer level: Needs assistance Equipment used: Rolling walker (2 wheels) Transfers: Sit to/from Stand, Bed to chair/wheelchair/BSC Sit to Stand: Min guard           General transfer comment: min guard to ambulate to toilet and to transfer back to EOB     Balance Overall balance assessment: Needs assistance Sitting-balance support: Feet supported Sitting balance-Leahy Scale: Fair     Standing balance support: Bilateral upper extremity supported, During functional activity Standing balance-Leahy Scale: Fair Standing balance comment: reliant on RW for support                           ADL either performed or assessed with clinical judgement   ADL Overall ADL's : Needs assistance/impaired                     Lower Body Dressing: Sitting/lateral leans;Supervision/safety;Set up Lower Body Dressing Details (indicate cue type and reason): to donn post op shoe and tennis shoe Toilet Transfer: Ambulation;Min guard;Regular Toilet;Rolling walker (2 wheels) Toilet Transfer Details (indicate cue type and reason): min guard and cues for safety Toileting- Clothing Manipulation and Hygiene: Modified independent;Sitting/lateral lean         General ADL Comments: declined standing at sink for grooming    Extremity/Trunk Assessment              Vision       Perception     Praxis      Cognition Arousal/Alertness: Awake/alert Behavior During Therapy: WFL for tasks assessed/performed Overall Cognitive Status:  Within Functional Limits for tasks assessed                                 General Comments: limited by pain        Exercises      Shoulder Instructions       General Comments tolerated >1 minute of static standing with min guard     Pertinent Vitals/ Pain       Pain Assessment Pain Assessment: 0-10 Pain Score: 8  Pain Location: L heel with pressure Pain Descriptors / Indicators: Grimacing, Guarding, Tender, Sore Pain Intervention(s): Limited activity within patient's tolerance, Monitored during session, Premedicated before session  Home Living                                          Prior Functioning/Environment              Frequency  Min 2X/week        Progress Toward Goals  OT Goals(current goals can now be found in the care plan section)  Progress towards OT goals: Progressing toward goals  Acute Rehab OT Goals Patient Stated Goal: less pain OT Goal Formulation: With patient Time For Goal Achievement: 11/15/22 Potential to Achieve Goals: Good ADL Goals Pt Will Perform Grooming: standing;with min guard assist Pt Will Transfer to Toilet: ambulating;with min guard assist Pt Will Perform Tub/Shower Transfer: ambulating;with min guard assist Additional ADL Goal #1: Pt will demonstrate 1 min of standing tolerance with min guard assist in preparation for OOB ADLs.  Plan Discharge plan remains appropriate    Co-evaluation                 AM-PAC OT "6 Clicks" Daily Activity     Outcome Measure   Help from another person eating meals?: None Help from another person taking care of personal grooming?: A Little Help from another person toileting, which includes using toliet, bedpan, or urinal?: A Little Help from another person bathing (including washing, rinsing, drying)?: A Little Help from another person to put on and taking off regular upper body clothing?: None Help from another person to put on and taking off regular lower body clothing?: A Little 6 Click Score: 20    End of Session Equipment Utilized During Treatment: Gait belt;Rolling walker (2 wheels)  OT Visit Diagnosis: Unsteadiness on feet (R26.81);Pain Pain - Right/Left: Left Pain - part of body:  Ankle and joints of foot   Activity Tolerance Patient limited by pain   Patient Left in bed;with call bell/phone within reach   Nurse Communication Mobility status        Time: 1438-1450 OT Time Calculation (min): 12 min  Charges: OT General Charges $OT Visit: 1 Visit OT Treatments $Self Care/Home Management : 8-22 mins  Alfonse Flavors, OTA Acute Rehabilitation Services  Office [phone]   Dewain Penning 11/02/2022, 2:58 PM

## 2022-11-02 NOTE — Progress Notes (Signed)
Mobility Specialist Progress Note:    11/02/22 1204  Mobility  Activity Ambulated with assistance in hallway  Level of Assistance Standby assist, set-up cues, supervision of patient - no hands on  Assistive Device Front wheel walker  Distance Ambulated (ft) 120 ft  LLE Weight Bearing PWB (through heel only)  Activity Response Tolerated well  Mobility Referral Yes  $Mobility charge 1 Mobility  Mobility Specialist Start Time (ACUTE ONLY) 1153  Mobility Specialist Stop Time (ACUTE ONLY) 1205  Mobility Specialist Time Calculation (min) (ACUTE ONLY) 12 min   Pt received in bed agreeable to ambulate. Pt was able to don and doff post op shoe independently. Pt stood impulsively, independently and ambulated with good adherence to WB precautions.  Pt c/o pain in his L foot rating it 6/10, otherwise no c/o . Pt assisted back to bed w/ call bell and personal belongings at hand.  Thompson Grayer Mobility Specialist  Please contact vis Secure Chat or  Rehab Office [phone]

## 2022-11-02 NOTE — TOC Benefit Eligibility Note (Signed)
Pharmacy Patient Advocate Encounter  Insurance verification completed.    The patient is insured through [facility]   Ran test claim for Dal[employer] 500 and product Not Covered   This test claim was processed through [employer]- copay amounts may vary at other pharmacies due to [employer], or as the patient moves through the different stages of their insurance plan.    Roland Earl, CPHT Pharmacy Patient Advocate Specialist [facility] Patient Advocate Team Direct Number: [phone]  Fax: [phone]

## 2022-11-02 NOTE — Consult Note (Addendum)
Hospital Consult    Reason for Consult:  osteomyelitis of left 5th metatarsal head Requesting Physician:  Marland Mcalpine DO MRN #:  [id number]  History of Present Illness: Kenneth Bailey is a 57 y.o. male with a PMH of COPD, HTN, hyperlipidemia, CAD s/p CABG x4 in 2023, and DMII who was admitted to the hospital on 10/30/2022 with osteomyelitis of the left fifth metatarsal head.  He has a history of a worsening wound of the left foot followed by podiatry.  His left foot wound progressed to osteomyelitis and he underwent left foot irrigation and debridement with resection of the fifth metatarsal head on 10/31/2022 by Dr.Sikora.   His wound cultures have been growing Staph aureus.   We were consulted to evaluate the patient's blood flow and hopefully optimize his chances of healing the amputation site.  He still has continued pain in his left foot at the incision site.  This pain drastically worsens when he brings his foot to the floor.  Podiatry has been following the patient and reports development of skin necrosis around his left foot incision site.   The patient is a current smoker.  He denies any prior vascular interventions on his lower extremities.  His right greater saphenous vein was harvested for CABG in 2023.  He denies any history of claudication, rest pain, nonhealing wounds of lower extremities aside from his current wound.  He is on aspirin and Gemfibrozil.  Past Medical History:  Diagnosis Date   COPD (chronic obstructive pulmonary disease) (HCC)    Diabetes mellitus, type 2 (HCC)    HLD (hyperlipidemia)    Hypertension    Tobacco abuse     Past Surgical History:  Procedure Laterality Date   AMPUTATION Left 10/31/2022   Procedure: AMPUTATION LEFT FIFTH TOE;  Surgeon: Louann Sjogren, DPM;  Location: [facility] OR;  Service: Podiatry;  Laterality: Left;   AMPUTATION TOE Left 10/31/2022   Procedure: IRRIGATION AND DEBRIDEMENTOF LEFT FOOT;  Surgeon: Louann Sjogren, DPM;  Location: [facility] OR;   Service: Podiatry;  Laterality: Left;   CORONARY ARTERY BYPASS GRAFT N/A 06/10/2021   Procedure: CORONARY ARTERY BYPASS GRAFTING (CABG) TIMES FOUR, USING LEFT INTERNAL MAMMARY ARTERY AND RIGHT GREATER SAPHENOUS VEIN HARVESTED ENDOSCOPICALLY;  Surgeon: Lovett Sox, MD;  Location: [facility] OR;  Service: Open Heart Surgery;  Laterality: N/A;   ENDOVEIN HARVEST OF GREATER SAPHENOUS VEIN Right 06/10/2021   Procedure: ENDOVEIN HARVEST OF GREATER SAPHENOUS VEIN;  Surgeon: Lovett Sox, MD;  Location: [facility] OR;  Service: Open Heart Surgery;  Laterality: Right;   RIGHT/LEFT HEART CATH AND CORONARY ANGIOGRAPHY N/A 06/06/2021   Procedure: RIGHT/LEFT HEART CATH AND CORONARY ANGIOGRAPHY;  Surgeon: Dolores Patty, MD;  Location: [facility] INVASIVE CV LAB;  Service: Cardiovascular;  Laterality: N/A;   TEE WITHOUT CARDIOVERSION N/A 06/10/2021   Procedure: TRANSESOPHAGEAL ECHOCARDIOGRAM (TEE);  Surgeon: Lovett Sox, MD;  Location[facility] OR;  Service: Open Heart Surgery;  Laterality: N/A;    Allergies  Allergen Reactions   Lisinopril     Leg swelling   Statins     Leg swelling    Prior to Admission medications   Medication Sig Start Date End Date Taking? Authorizing Provider  acetaminophen (TYLENOL) 325 MG tablet Take 325 mg by mouth every 6 (six) hours as needed for moderate pain.   Yes [provider]  aspirin EC 81 MG tablet Take 1 tablet (81 mg total) by mouth daily. Swallow whole. 02/27/22  Yes Andrey Farmer, PA-C  dapagliflozin propanediol (FARXIGA) 10 MG TABS tablet  Take 1 tablet (10 mg total) by mouth daily before breakfast. 02/27/22  Yes Andrey Farmer, PA-C  gemfibrozil (LOPID) 600 MG tablet Take 600 mg by mouth 2 (two) times daily before a meal.   Yes [provider]  insulin glargine-yfgn (SEMGLEE, YFGN,) 100 UNIT/ML injection Inject 2-50 Units into the skin 2 (two) times daily. Sliding   Yes [provider]  Melatonin 10 MG TABS Take 10 mg by mouth at bedtime.    Yes [provider]  metFORMIN (GLUCOPHAGE) 1000 MG tablet Take 1,000 mg by mouth 2 (two) times daily. 05/26/21  Yes [provider]  nitroGLYCERIN (NITROSTAT) 0.4 MG SL tablet Place 1 tablet (0.4 mg total) under the tongue every 5 (five) minutes as needed for chest pain. 09/04/22 12/03/22 Yes Bensimhon, Bevelyn Buckles, MD  doxycycline (VIBRAMYCIN) 100 MG capsule Take 1 capsule (100 mg total) by mouth 2 (two) times daily for 14 days. 10/31/22 11/14/22  McCaughan, Dia D, DPM  sacubitril-valsartan (ENTRESTO) 24-26 MG Take 1 tablet by mouth 2 (two) times daily. Patient not taking: Reported on 10/30/2022 03/20/22   Jacklynn Ganong, FNP    Social History   Socioeconomic History   Marital status: Divorced    Spouse name: Not on file   Number of children: Not on file   Years of education: Not on file   Highest education level: Not on file  Occupational History   Not on file  Tobacco Use   Smoking status: Every Day    Types: Cigarettes   Smokeless tobacco: Current  Vaping Use   Vaping Use: Never used  Substance and Sexual Activity   Alcohol use: Yes    Comment: occasionally   Drug use: Never   Sexual activity: Not on file  Other Topics Concern   Not on file  Social History Narrative   Not on file   Social Determinants of Health   Financial Resource Strain: High Risk (01/09/2022)   Overall Financial Resource Strain (CARDIA)    Difficulty of Paying Living Expenses: Hard  Food Insecurity: No Food Insecurity (10/30/2022)   Hunger Vital Sign    Worried About Running Out of Food in the Last Year: Never true    Ran Out of Food in the Last Year: Never true  Transportation Needs: No Transportation Needs (10/30/2022)   PRAPARE - Administrator, Civil Service (Medical): No    Lack of Transportation (Non-Medical): No  Physical Activity: Not on file  Stress: Not on file  Social Connections: Not on file  Intimate Partner Violence: Not At Risk (10/30/2022)   Humiliation,  Afraid, Rape, and Kick questionnaire    Fear of Current or Ex-Partner: No    Emotionally Abused: No    Physically Abused: No    Sexually Abused: No     Family History  Problem Relation Age of Onset   Hypertension Mother    Cancer Mother    Heart disease Father     ROS: [x]  Positive   [ ]  Negative   [ ]  All sytems reviewed and are negative  Cardiac: []  chest pain/pressure []  hx MI []  SOB   Vascular: []  pain in legs while walking []  pain in legs at rest []  pain in legs at night [x]  non-healing ulcers []  hx of DVT []  swelling in legs  Pulmonary: []  asthma/wheezing []  home O2  Neurologic: []  hx of CVA []  mini stroke   Hematologic: []  hx of cancer  Endocrine:   [x]  diabetes []   thyroid disease  GI []  GERD  GU: []  CKD/renal failure []  HD--[]  M/W/F or []  T/T/S  Psychiatric: []  anxiety []  depression  Musculoskeletal: []  arthritis []  joint pain  Integumentary: []  rashes []  ulcers  Constitutional: []  fever  []  chills  Physical Examination  Vitals:   11/02/22 0553 11/02/22 0810  BP: 138/83 124/72  Pulse: 79 73  Resp: 14 18  Temp: 98.2 F (36.8 C) 98 F (36.7 C)  SpO2: 97% 100%   Body mass index is 23.9 kg/m.  General:  WDWN in NAD Gait: Not observed HENT: WNL, normocephalic Pulmonary: normal non-labored breathing Cardiac: regular Abdomen:  soft, NT; aortic pulse is non palpable Skin: without rashes Vascular Exam/Pulses: Palpable femoral pulses bilaterally.  Palpable left popliteal pulse.  1+ DP/PT pulses on the left Extremities: Left lateral foot incision intact with skin edge necrosis Musculoskeletal: no muscle wasting or atrophy  Neurologic: A&O X 3 Psychiatric:  The pt has Normal affect.   CBC    Component Value Date/Time   WBC 13.2 (H) 11/02/2022 0332   RBC 4.12 (L) 11/02/2022 0332   HGB 11.4 (L) 11/02/2022 0332   HCT 34.8 (L) 11/02/2022 0332   PLT 306 11/02/2022 0332   MCV 84.5 11/02/2022 0332   MCH 27.7 11/02/2022 0332    MCHC 32.8 11/02/2022 0332   RDW 13.9 11/02/2022 0332   LYMPHSABS 1.4 11/02/2022 0332   MONOABS 1.0 11/02/2022 0332   EOSABS 0.1 11/02/2022 0332   BASOSABS 0.1 11/02/2022 0332    BMET    Component Value Date/Time   NA 133 (L) 11/02/2022 0332   K 4.8 11/02/2022 0332   CL 97 (L) 11/02/2022 0332   CO2 24 11/02/2022 0332   GLUCOSE 153 (H) 11/02/2022 0332   BUN 37 (H) 11/02/2022 0332   CREATININE 1.09 11/02/2022 0332   CALCIUM 9.6 11/02/2022 0332   GFRNONAA >60 11/02/2022 0332    COAGS: Lab Results  Component Value Date   INR 1.3 (H) 06/10/2021   INR 1.0 06/10/2021     Non-Invasive Vascular Imaging:   ABIs (10/31/2022):  +---------+------------------+-----+----------+--------+  Right   Rt Pressure (mmHg)IndexWaveform  Comment   +---------+------------------+-----+----------+--------+  Brachial 152                    biphasic            +---------+------------------+-----+----------+--------+  PTA     91                0.60 monophasic          +---------+------------------+-----+----------+--------+  DP      96                0.63 monophasic          +---------+------------------+-----+----------+--------+  Great Toe39                0.26 Abnormal            +---------+------------------+-----+----------+--------+   +---------+------------------+-----+-------------------+-------------------  ----+  Left    Lt Pressure (mmHg)IndexWaveform           Comment                   +---------+------------------+-----+-------------------+-------------------  ----+  Brachial                        biphasic           No pressure  obtained  due to IV  placement      +---------+------------------+-----+-------------------+-------------------  ----+  PTA     91                0.60 monophasic                                    +---------+------------------+-----+-------------------+-------------------  ----+  DP      74                0.49 dampened monophasic                          +---------+------------------+-----+-------------------+-------------------  ----+  Great Toe                       Absent                                       +---------+------------------+-----+-------------------+-------------------  ----+   +-------+-----------+-----------+------------+------------+  ABI/TBIToday's ABIToday's TBIPrevious ABIPrevious TBI  +-------+-----------+-----------+------------+------------+  Right 0.63       0.26       0.59        0.22          +-------+-----------+-----------+------------+------------+  Left  0.60       Absent     0.74        0.38           ASSESSMENT/PLAN: This is a 57 y.o. male with a history of nonhealing left foot wound s/p left foot irrigation and debridement with fifth metatarsal head resection on 6/11  -The patient has no prior history of revascularization procedures in his lower extremities.  He denies any previous symptoms of claudication or tissue loss.  His left foot wound progressed into osteomyelitis and required left fifth metatarsal resection 2 days ago.  His wound cultures have been growing Staph aureus.  He is on broad-spectrum antibiotics -Unfortunately the patient has continued pain in the left foot.  There is also developing skin edge necrosis of his incision site -ABIs were obtained on 6/11.  His ABI on the left shows moderate arterial disease at 0.6.  There is an absent great toe pressure.  He has monophasic tibial vessel flow -On exam he has palpable femoral pulses.  He also has a 2+ palpable left popliteal pulse.  He has diminished DP/PT pulses on the left.  He would benefit from angiogram with possible intervention on the left to improve chances of healing his incision.  This will likely involve tibial intervention -We will schedule  the patient for abdominal aortogram with left lower extremity runoff and possible intervention with Dr. Lenell Antu tomorrow in the Cath Lab.  Will make n.p.o. at midnight and place consent orders.  The patient is agreeable and all questions have been answered  Loel Dubonnet, PA-C [facility] [phone]   I have seen and evaluated the patient. I agree with the PA note as documented above.  57 year old male with multiple risk factors including COPD, hypertension, coronary artery disease status post CABG, diabetes that vascular surgery has been consulted for left fifth toe osteomyelitis.  Patient is now postop day 2 status post left fifth metatarsal head resection by podiatry.  There is some necrosis of the incision.  ABIs are depressed at 0.6 with absent  toe pressures.  Patient would benefit from angiogram with a focus on the left leg with possible intervention as I discussed with him through transfemoral access.  He has a palpable femoral and popliteal pulse.  I suspect he has tibial disease.  Will schedule tomorrow.  Please keep n.p.o. after midnight.  Consent order placed in the chart.  Risk benefits discussed.  Cephus Shelling, MD [facility] [employer] Office: [phone]

## 2022-11-02 NOTE — Progress Notes (Addendum)
Subjective:  Patient ID: Kenneth Bailey, male    DOB: November 29, 1965,  MRN: [id number]  Patient POD#2 s/p left fifth metatarsal head resection and irrigation and debridement. Relates continued pain and unable to perform PT yesterday due to pain.    Past Medical History:  Diagnosis Date   COPD (chronic obstructive pulmonary disease) (HCC)    Diabetes mellitus, type 2 (HCC)    HLD (hyperlipidemia)    Hypertension    Tobacco abuse      Past Surgical History:  Procedure Laterality Date   AMPUTATION Left 10/31/2022   Procedure: AMPUTATION LEFT FIFTH TOE;  Surgeon: Louann Sjogren, DPM;  Location: [facility] OR;  Service: Podiatry;  Laterality: Left;   AMPUTATION TOE Left 10/31/2022   Procedure: IRRIGATION AND DEBRIDEMENTOF LEFT FOOT;  Surgeon: Louann Sjogren, DPM;  Location: [facility] OR;  Service: Podiatry;  Laterality: Left;   CORONARY ARTERY BYPASS GRAFT N/A 06/10/2021   Procedure: CORONARY ARTERY BYPASS GRAFTING (CABG) TIMES FOUR, USING LEFT INTERNAL MAMMARY ARTERY AND RIGHT GREATER SAPHENOUS VEIN HARVESTED ENDOSCOPICALLY;  Surgeon: Lovett Sox, MD;  Location: [facility] OR;  Service: Open Heart Surgery;  Laterality: N/A;   ENDOVEIN HARVEST OF GREATER SAPHENOUS VEIN Right 06/10/2021   Procedure: ENDOVEIN HARVEST OF GREATER SAPHENOUS VEIN;  Surgeon: Lovett Sox, MD;  Location: [facility] OR;  Service: Open Heart Surgery;  Laterality: Right;   RIGHT/LEFT HEART CATH AND CORONARY ANGIOGRAPHY N/A 06/06/2021   Procedure: RIGHT/LEFT HEART CATH AND CORONARY ANGIOGRAPHY;  Surgeon: Dolores Patty, MD;  Location: [facility]AS[facility];  Service: Cardiovascular;  Laterality: N/A;   TEE WITHOUT CARDIOVERSION N/A 06/10/2021   Procedure: TRANSESOPHAGEAL ECHOCARDIOGRAM (TEE);  Surgeon: Lovett Sox, MD;  Location: [facility] OR;  Service: Open Heart Surgery;  Laterality: N/A;       Latest Ref Rng & Units 11/02/2022    3:32 AM 11/01/2022   12:59 AM 10/31/2022    4:50 AM  CBC  WBC 4.0 - 10.5 K/uL 13.2  16.5  14.2   Hemoglobin  13.0 - 17.0 g/dL 40.9  81.1  91.4   Hematocrit 39.0 - 52.0 % 34.8  36.9  36.9   Platelets 150 - 400 K/uL 306  353  290        Latest Ref Rng & Units 11/02/2022    3:32 AM 11/01/2022   12:59 AM 10/31/2022    4:50 AM  BMP  Glucose 70 - 99 mg/dL 782  956  213   BUN 6 - 20 mg/dL 37  53  49   Creatinine 0.61 - 1.24 mg/dL 0.86  5.78  4.69   Sodium 135 - 145 mmol/L 133  135  137   Potassium 3.5 - 5.1 mmol/L 4.8  4.6  3.8   Chloride 98 - 111 mmol/L 97  97  100   CO2 22 - 32 mmol/L 24  22  22    Calcium 8.9 - 10.3 mg/dL 9.6  9.6  9.4      Objective:   Vitals:   11/01/22 2001 11/02/22 0553  BP: (!) 146/72 138/83  Pulse: 83 79  Resp:  14  Temp: 99.7 F (37.6 C) 98.2 F (36.8 C)  SpO2: 97% 97%    General:AA&O x 3. Normal mood and affect   Vascular: DP and PT faintly palpable bilateral. Brisk capillary refill to all digits. Pedal hair present   Neruological. Epicritic sensation grossly intact.   Derm: Incision well coapted, No signes of dehiscence. Erythema improved. Some necrosis noted to the proximal incision.  No  purulence noted  MSK: MMT 5/5 in dorsiflexion, plantar flexion, inversion and eversion. Normal joint ROM without pain or crepitus.      Right ABIs and TBIs appear essentially unchanged. Left ABIs and TBIs  appear decreased.    Summary:  Right: Resting right ankle-brachial index indicates moderate right lower  extremity arterial disease. The right toe-brachial index is abnormal.   Left: Resting left ankle-brachial index indicates moderate left lower  extremity arterial disease.     Assessment & Plan:  Patient was evaluated and treated and all questions answered.  DX: POD #2 s/p fifth metatarsal head resection and irrigation and debridement  Patient to keep dressing clean dry and intact.  DME: Heel weight bearing in post-op shoe   Cultures growing staph aureus Appreciate ID reccomendations.  Feel there was surgical cure of bone infection.  Due to continued  pain and ABIs diminished on left from previous and some necrosis of proximal incision would recommend Vascular consult for evaluation as concern for healing of wound.  Will continue to follow.   Louann Sjogren, DPM  Accessible via secure chat for questions or concerns.

## 2022-11-02 NOTE — Progress Notes (Signed)
PHARMACY NOTE:  ANTIMICROBIAL RENAL DOSAGE ADJUSTMENT  Current antimicrobial regimen includes a mismatch between antimicrobial dosage and estimated renal function.  As per policy approved by the Pharmacy & Therapeutics and Medical Executive Committees, the antimicrobial dosage will be adjusted accordingly.  Current antimicrobial dosage:  cefepime 2g IV q12 hours and vancomycin 1250mg  IV q24 hours  Indication: osteomyelitis  Renal Function:  Estimated Creatinine Clearance: 74.1 mL/min (by C-G formula based on SCr of 1.09 mg/dL). []      On intermittent HD, scheduled: []      On CRRT    Antimicrobial dosage has been changed to:  cefepime 2g IV q8 hours and vancomycin 1500mg  IV q24 hours (eAUC 443, Scr 1)  Additional comments:   Thank you for allowing pharmacy to be a part of this patient's care.  Rexford Maus, PharmD, BCPS 11/02/2022 2:38 PM

## 2022-11-03 ENCOUNTER — Encounter (HOSPITAL_COMMUNITY)
Admission: EM | Disposition: A | Discharge status: Home / Self Care | Payer: Self-pay | Source: Home / Self Care | Attending: Internal Medicine

## 2022-11-03 DIAGNOSIS — A4902 Methicillin resistant Staphylococcus aureus infection, unspecified site: Secondary | ICD-10-CM

## 2022-11-03 DIAGNOSIS — M86172 Other acute osteomyelitis, left ankle and foot: Secondary | ICD-10-CM | POA: Diagnosis not present

## 2022-11-03 DIAGNOSIS — I70245 Atherosclerosis of native arteries of left leg with ulceration of other part of foot: Secondary | ICD-10-CM

## 2022-11-03 DIAGNOSIS — L02612 Cutaneous abscess of left foot: Secondary | ICD-10-CM | POA: Diagnosis not present

## 2022-11-03 DIAGNOSIS — F17210 Nicotine dependence, cigarettes, uncomplicated: Secondary | ICD-10-CM | POA: Diagnosis not present

## 2022-11-03 HISTORY — PX: PERIPHERAL VASCULAR BALLOON ANGIOPLASTY: CATH118281

## 2022-11-03 HISTORY — PX: ABDOMINAL AORTOGRAM W/LOWER EXTREMITY: CATH118223

## 2022-11-03 LAB — CBC WITH DIFFERENTIAL/PLATELET
Abs Immature Granulocytes: 0.15 10*3/uL — ABNORMAL HIGH (ref 0.00–0.07)
Basophils Absolute: 0.1 10*3/uL (ref 0.0–0.1)
Basophils Relative: 1 %
Eosinophils Absolute: 0.2 10*3/uL (ref 0.0–0.5)
Eosinophils Relative: 1 %
HCT: 35.3 % — ABNORMAL LOW (ref 39.0–52.0)
Hemoglobin: 11.5 g/dL — ABNORMAL LOW (ref 13.0–17.0)
Immature Granulocytes: 1 %
Lymphocytes Relative: 7 %
Lymphs Abs: 0.9 10*3/uL (ref 0.7–4.0)
MCH: 27.6 pg (ref 26.0–34.0)
MCHC: 32.6 g/dL (ref 30.0–36.0)
MCV: 84.9 fL (ref 80.0–100.0)
Monocytes Absolute: 1 10*3/uL (ref 0.1–1.0)
Monocytes Relative: 7 %
Neutro Abs: 11.8 10*3/uL — ABNORMAL HIGH (ref 1.7–7.7)
Neutrophils Relative %: 83 %
Platelets: 326 10*3/uL (ref 150–400)
RBC: 4.16 MIL/uL — ABNORMAL LOW (ref 4.22–5.81)
RDW: 13.7 % (ref 11.5–15.5)
WBC: 14 10*3/uL — ABNORMAL HIGH (ref 4.0–10.5)
nRBC: 0 % (ref 0.0–0.2)

## 2022-11-03 LAB — RETICULOCYTES
Immature Retic Fract: 15.3 % (ref 2.3–15.9)
RBC.: 4.28 MIL/uL (ref 4.22–5.81)
Retic Count, Absolute: 44.9 10*3/uL (ref 19.0–186.0)
Retic Ct Pct: 1.1 % (ref 0.4–3.1)

## 2022-11-03 LAB — COMPREHENSIVE METABOLIC PANEL
ALT: 12 U/L (ref 0–44)
AST: 13 U/L — ABNORMAL LOW (ref 15–41)
Albumin: 3.1 g/dL — ABNORMAL LOW (ref 3.5–5.0)
Alkaline Phosphatase: 76 U/L (ref 38–126)
Anion gap: 15 (ref 5–15)
BUN: 34 mg/dL — ABNORMAL HIGH (ref 6–20)
CO2: 21 mmol/L — ABNORMAL LOW (ref 22–32)
Calcium: 9.9 mg/dL (ref 8.9–10.3)
Chloride: 97 mmol/L — ABNORMAL LOW (ref 98–111)
Creatinine, Ser: 1.11 mg/dL (ref 0.61–1.24)
GFR, Estimated: >60 mL/min (ref >60)
Glucose, Bld: 176 mg/dL — ABNORMAL HIGH (ref 70–99)
Potassium: 4.8 mmol/L (ref 3.5–5.1)
Sodium: 133 mmol/L — ABNORMAL LOW (ref 135–145)
Total Bilirubin: 0.7 mg/dL (ref 0.3–1.2)
Total Protein: 7.5 g/dL (ref 6.5–8.1)

## 2022-11-03 LAB — GLUCOSE, CAPILLARY
Glucose-Capillary: 171 mg/dL — ABNORMAL HIGH (ref 70–99)
Glucose-Capillary: 157 mg/dL — ABNORMAL HIGH (ref 70–99)
Glucose-Capillary: 208 mg/dL — ABNORMAL HIGH (ref 70–99)
Glucose-Capillary: 164 mg/dL — ABNORMAL HIGH (ref 70–99)

## 2022-11-03 LAB — CULTURE, BLOOD (ROUTINE X 2)

## 2022-11-03 LAB — FERRITIN: Ferritin: 193 ng/mL (ref 24–336)

## 2022-11-03 LAB — MAGNESIUM: Magnesium: 1.7 mg/dL (ref 1.7–2.4)

## 2022-11-03 LAB — SURGICAL PCR SCREEN
MRSA, PCR: NEGATIVE
Staphylococcus aureus: NEGATIVE

## 2022-11-03 LAB — FOLATE: Folate: 9.5 ng/mL (ref >5.9)

## 2022-11-03 LAB — IRON AND TIBC
Iron: 17 ug/dL — ABNORMAL LOW (ref 45–182)
Saturation Ratios: 5 % — ABNORMAL LOW (ref 17.9–39.5)
TIBC: 316 ug/dL (ref 250–450)
UIBC: 299 ug/dL

## 2022-11-03 LAB — VITAMIN B12: Vitamin B-12: 355 pg/mL (ref 180–914)

## 2022-11-03 LAB — PHOSPHORUS: Phosphorus: 4 mg/dL (ref 2.5–4.6)

## 2022-11-03 SURGERY — ABDOMINAL AORTOGRAM W/LOWER EXTREMITY
Anesthesia: LOCAL

## 2022-11-03 MED ORDER — DOXYCYCLINE HYCLATE 100 MG PO TABS: 100.0000 mg | ORAL_TABLET | Freq: Two times a day (BID) | ORAL | Status: DC

## 2022-11-03 MED ORDER — SODIUM CHLORIDE 0.9 % IV SOLN: INTRAVENOUS | Status: DC

## 2022-11-03 MED ORDER — SODIUM CHLORIDE 0.9% FLUSH
3.0000 mL | Freq: Two times a day (BID) | INTRAVENOUS | Status: DC
  Administered 2022-11-03 – 2022-11-17 (×27): 3 mL via INTRAVENOUS

## 2022-11-03 MED ORDER — CLOPIDOGREL BISULFATE 75 MG PO TABS: 300.0000 mg | ORAL_TABLET | Freq: Once | ORAL | Status: DC

## 2022-11-03 MED ORDER — NITROGLYCERIN 1 MG/10 ML FOR IR/CATH LAB
INTRA_ARTERIAL | Status: DC | PRN
  Administered 2022-11-03 (×2): 200 ug via INTRA_ARTERIAL

## 2022-11-03 MED ORDER — NITROGLYCERIN 1 MG/10 ML FOR IR/CATH LAB
INTRA_ARTERIAL | Status: AC
  Filled 2022-11-03: qty 10

## 2022-11-03 MED ORDER — IODIXANOL 320 MG/ML IV SOLN
INTRAVENOUS | Status: DC | PRN
  Administered 2022-11-03: 135 mL via INTRA_ARTERIAL

## 2022-11-03 MED ORDER — FENTANYL CITRATE (PF) 100 MCG/2ML IJ SOLN
INTRAMUSCULAR | Status: AC
  Filled 2022-11-03: qty 2

## 2022-11-03 MED ORDER — HYDRALAZINE HCL 20 MG/ML IJ SOLN
5.0000 mg | INTRAMUSCULAR | Status: DC | PRN
  Filled 2022-11-03: qty 1

## 2022-11-03 MED ORDER — SODIUM CHLORIDE 0.9 % IV SOLN: INTRAVENOUS | Status: AC

## 2022-11-03 MED ORDER — CALCIUM CARBONATE ANTACID 500 MG PO CHEW
1.0000 | CHEWABLE_TABLET | ORAL | Status: DC | PRN
  Administered 2022-11-03: 200 mg via ORAL
  Filled 2022-11-03 (×2): qty 1

## 2022-11-03 MED ORDER — LABETALOL HCL 5 MG/ML IV SOLN: 10.0000 mg | INTRAVENOUS | Status: DC | PRN

## 2022-11-03 MED ORDER — MIDAZOLAM HCL 2 MG/2ML IJ SOLN
INTRAMUSCULAR | Status: DC | PRN
  Administered 2022-11-03: 1 mg via INTRAVENOUS

## 2022-11-03 MED ORDER — SODIUM CHLORIDE 0.9% FLUSH: 3.0000 mL | INTRAVENOUS | Status: DC | PRN

## 2022-11-03 MED ORDER — CLOPIDOGREL BISULFATE 75 MG PO TABS: 75.0000 mg | ORAL_TABLET | Freq: Every day | ORAL | Status: DC

## 2022-11-03 MED ORDER — HEPARIN SODIUM (PORCINE) 1000 UNIT/ML IJ SOLN
INTRAMUSCULAR | Status: AC
  Filled 2022-11-03: qty 10

## 2022-11-03 MED ORDER — SODIUM CHLORIDE 0.9 % IV SOLN: 250.0000 mL | INTRAVENOUS | Status: DC | PRN

## 2022-11-03 MED ORDER — ASPIRIN 81 MG PO CHEW
CHEWABLE_TABLET | ORAL | Status: DC | PRN
  Administered 2022-11-03: 81 mg via ORAL

## 2022-11-03 MED ORDER — ORITAVANCIN DIPHOSPHATE 400 MG IV SOLR
1200.0000 mg | Freq: Once | INTRAVENOUS | Status: AC
  Administered 2022-11-04: 1200 mg via INTRAVENOUS
  Filled 2022-11-03: qty 120

## 2022-11-03 MED ORDER — LIDOCAINE HCL (PF) 1 % IJ SOLN
INTRAMUSCULAR | Status: AC
  Filled 2022-11-03: qty 30

## 2022-11-03 MED ORDER — ACETAMINOPHEN 325 MG PO TABS: 650.0000 mg | ORAL_TABLET | ORAL | Status: DC | PRN

## 2022-11-03 MED ORDER — HEPARIN SODIUM (PORCINE) 1000 UNIT/ML IJ SOLN
INTRAMUSCULAR | Status: DC | PRN
  Administered 2022-11-03: 7000 [IU] via INTRAVENOUS
  Administered 2022-11-03: 2000 [IU] via INTRAVENOUS

## 2022-11-03 MED ORDER — FENTANYL CITRATE (PF) 100 MCG/2ML IJ SOLN
INTRAMUSCULAR | Status: DC | PRN
  Administered 2022-11-03 (×4): 25 ug via INTRAVENOUS

## 2022-11-03 MED ORDER — CLOPIDOGREL BISULFATE 75 MG PO TABS
75.0000 mg | ORAL_TABLET | Freq: Every day | ORAL | Status: DC
  Administered 2022-11-04: 75 mg via ORAL
  Filled 2022-11-03: qty 1

## 2022-11-03 MED ORDER — POLYSACCHARIDE IRON COMPLEX 150 MG PO CAPS
150.0000 mg | ORAL_CAPSULE | Freq: Every day | ORAL | Status: DC
  Administered 2022-11-03 – 2022-11-04 (×2): 150 mg via ORAL
  Filled 2022-11-03 (×2): qty 1

## 2022-11-03 MED ORDER — HEPARIN (PORCINE) IN NACL 1000-0.9 UT/500ML-% IV SOLN
INTRAVENOUS | Status: DC | PRN
  Administered 2022-11-03 (×2): 500 mL

## 2022-11-03 MED ORDER — ONDANSETRON HCL 4 MG/2ML IJ SOLN
4.0000 mg | Freq: Four times a day (QID) | INTRAMUSCULAR | Status: AC | PRN
  Administered 2022-11-03 – 2022-11-04 (×2): 4 mg via INTRAVENOUS
  Filled 2022-11-03 (×2): qty 2

## 2022-11-03 MED ORDER — MIDAZOLAM HCL 2 MG/2ML IJ SOLN
INTRAMUSCULAR | Status: AC
  Filled 2022-11-03: qty 2

## 2022-11-03 MED ORDER — CLOPIDOGREL BISULFATE 300 MG PO TABS
ORAL_TABLET | ORAL | Status: DC | PRN
  Administered 2022-11-03: 300 mg via ORAL

## 2022-11-03 MED ORDER — LIDOCAINE HCL (PF) 1 % IJ SOLN
INTRAMUSCULAR | Status: DC | PRN
  Administered 2022-11-03: 15 mL
  Administered 2022-11-03: 2 mL

## 2022-11-03 MED ORDER — ONDANSETRON HCL 4 MG/2ML IJ SOLN: 4.0000 mg | Freq: Four times a day (QID) | INTRAMUSCULAR | Status: DC | PRN

## 2022-11-03 SURGICAL SUPPLY — 26 items
BALLN STERLING OTW 2X40X150 (BALLOONS) ×2
BALLN STERLING OTW 3X220X150 (BALLOONS) ×2
BALLOON STERLING OTW 2X40X150 (BALLOONS) IMPLANT
BALLOON STERLING OTW 3X220X150 (BALLOONS) IMPLANT
CATH CXI 2.6F 65 ST (CATHETERS) ×2
CATH OMNI FLUSH 5F 65CM (CATHETERS) IMPLANT
CATH QUICKCROSS .018X135CM (MICROCATHETER) IMPLANT
CATH SPRT STRG 65X2.6FR ACPT (CATHETERS) IMPLANT
DEVICE CLOSURE MYNXGRIP 5F (Vascular Products) IMPLANT
DEVICE ONE SNARE 10MM (MISCELLANEOUS) IMPLANT
GLIDEWIRE ADV .035X260CM (WIRE) IMPLANT
KIT ENCORE 26 ADVANTAGE (KITS) IMPLANT
KIT MICROPUNCTURE NIT STIFF (SHEATH) IMPLANT
KIT PV (KITS) ×2 IMPLANT
SHEATH CATAPULT 5FR 60 (SHEATH) IMPLANT
SHEATH MICROPUNCTURE PEDAL 4FR (SHEATH) IMPLANT
SHEATH PINNACLE 5F 10CM (SHEATH) IMPLANT
SHEATH PROBE COVER 6X72 (BAG) IMPLANT
STOPCOCK MORSE 400PSI 3WAY (MISCELLANEOUS) IMPLANT
SYR MEDRAD MARK 7 150ML (SYRINGE) ×2 IMPLANT
TRANSDUCER W/STOPCOCK (MISCELLANEOUS) ×2 IMPLANT
TRAY PV CATH (CUSTOM PROCEDURE TRAY) ×2 IMPLANT
TUBING CIL FLEX 10 FLL-RA (TUBING) IMPLANT
WIRE BENTSON .035X145CM (WIRE) IMPLANT
WIRE G V18X300CM (WIRE) IMPLANT
WIRE SHEPHERD 12G .014 (WIRE) IMPLANT

## 2022-11-03 NOTE — Op Note (Signed)
Patient name: Kenneth Bailey MRN: [id number] DOB: 1966-03-28 Sex: male  11/03/2022 Pre-operative Diagnosis: Critical limb ischemia of the left lower extremity with tissue loss Post-operative diagnosis:  Same Surgeon:  Cephus Shelling, MD Procedure Performed: 1.  Ultrasound-guided access right common femoral artery 2.  Aortogram with catheter selection of aorta 3.  Left lower extremity arteriogram with selection of third order branches including the left below-knee popliteal artery 4.  Ultrasound-guided access left posterior tibial artery at the ankle retrograde with pedal sheath placement 5.  Left posterior tibial artery and tibioperoneal trunk angioplasty (3 mm x 220 mm Sterling) 6.  Mynx closure of the right common femoral artery 7.  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  He presents today for an angiogram with a focus on the left leg with possible intervention after risk benefits discussed.  Findings:   Ultrasound-guided access right common femoral artery.  Aortogram showed both renals were patent as well as his infrarenal aorta and both iliac arteries were patent.  On the left he had a patent common femoral and profunda.  The SFA has some diffuse disease without flow-limiting stenosis and is patent.  The above and below-knee popliteal arteries patent.  Tibial trifurcation is occluded.  Dominant runoff was through a diseased peroneal that reconstituted distally and was occluded proximally with a collateral filling the posterior tibial at the ankle.  Initially tried to go antegrade and I could not get through his chronic total occlusion in the tibial trifurcation focusing on the peroneal.  I then elected to stick posterior tibial retrograde at the ankle.  Then I was able to get through his chronic total occlusion in the posterior tibial  retrograde and snare my wire for through and through access.  The entire tibioperoneal trunk and posterior tibial artery was then angioplastied with a 3 mm x 220 mm Sterling.  I did go down and angioplastied the PT at the ankle with a 2 mm Sterling antegrade at completion.  Widely patent vessel at completion.  He now has inline flow through the posterior tibial artery.   Procedure:  The patient was identified in the holding area and taken to room 8.  The patient was then placed supine on the table and prepped and draped in the usual sterile fashion.  A time out was called.  The patient received Versed and fentanyl for conscious moderate sedation.  Vital signs were monitored including heart rate, respiratory rate, oxygenation and blood pressure.  I was present for all of moderate sedation.  Ultrasound was used to evaluate the right common femoral artery.  It was patent .  A digital ultrasound image was acquired.  A micropuncture needle was used to access the right common femoral artery under ultrasound guidance.  An 018 wire was advanced without resistance and a micropuncture sheath was placed.  The 018 wire was removed and a benson wire was placed.  The micropuncture sheath was exchanged for a 5 french sheath.  An omniflush catheter was advanced over the wire to the level of L-1.  An abdominal angiogram was obtained.  Next, using the omniflush catheter and a benson wire, the aortic bifurcation was crossed and the catheter was placed into theleft external iliac artery and left runoff was obtained.  Pertinent findings are noted above.  We elected to try antegrade intervention on his tibial disease.  I used  a Glidewire advantage down the left SFA and exchanged for a long 5 French sheath in the right groin with a catapault sheath over the aortic bifurcation into the left SFA.  Patient was given 100 units/kg IV heparin.  I then went down with a V18 wire and a quick cross catheter and got into the below-knee popliteal  artery.  I did hand injections here to identify the reconstitution of the peroneal distally and triued to get down the tibioperoneal trunk.  I could not get antegrade.  Eventually I did not think an antegrade attempt was going to be successful.  I then evaluated the posterior tibial at the ankle where it reconstituted and this was accessed under ultrasound guidance with a micro access needle placed a microsheath and then used to CXI catheter with a V18 to come retrograde and got through the long segment posterior tibial occlusion.  I did snare my wire from the sheath in the right groin over the aortic bifurcation as through and through access.  The entire TP trunk and posterior tibial artery was then angioplastied with a long 3 mm x 220 mm Sterling for 2 minutes from an antegrade approach.  Final imaging showed excellent flow down to the ankle where there appeared to be residual stenosis.  I then pulled the pedal sheath and got my wire down into the plantar arch and then I angioplastied across the posterior tibial at the ankle with a 2 mm Sterling.  We gave nitro.  We then had widely patent vessel with inline flow down the foot.  Wires and catheters removed.  Mynx closure in the right groin.  Plan: Patient now has inline flow down the left lower extremity through the posterior tibial after retrograde access.  Optimized from vascular surgery standpoint.  Aspirin Plavix.  Statin allergy.    Cephus Shelling, MD [facility] Office: [phone]

## 2022-11-03 NOTE — Progress Notes (Addendum)
[facility]    Date of Admission:  10/30/2022   Total days of antibiotics 5   ID: Uvaldo Parlette is a 57 y.o. 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  Medications:   aspirin EC  81 mg Oral Daily   [START ON 11/04/2022] clopidogrel  75 mg Oral Daily   enoxaparin (LOVENOX) injection  40 mg Subcutaneous Q24H   gemfibrozil  600 mg Oral BID AC   insulin aspart  0-5 Units Subcutaneous QHS   insulin aspart  0-9 Units Subcutaneous TID WC   melatonin  10 mg Oral QHS   nicotine  14 mg Transdermal Daily   sodium chloride flush  3 mL Intravenous Q12H    Objective: Vital signs in last 24 hours: Temp:  [97.9 F (36.6 C)-99.1 F (37.3 C)] 98 F (36.7 C) (06/14 1049) Pulse Rate:  [66-92] 75 (06/14 1049) Resp:  [12-19] 18 (06/14 1049) BP: (82-157)/(54-83) 127/74 (06/14 1049) SpO2:  [94 %-99 %] 98 % (06/14 1049)  Physical Exam  Constitutional: He is oriented to person, place, and time. He appears well-developed and well-nourished. No distress.  HENT:  Mouth/Throat: Oropharynx is clear and moist. No oropharyngeal exudate.  Cardiovascular: Normal rate, regular rhythm and normal heart sounds. Exam reveals no gallop and no friction rub.  No murmur heard.  Pulmonary/Chest: Effort normal and breath sounds normal. No respiratory distress. He has no wheezes.  Abdominal: Soft. Bowel sounds are normal. He exhibits no distension. There is no tenderness.  Lymphadenopathy:  He has no cervical adenopathy.  Neurological: He is alert and oriented to person, place, and time.  Skin: Skin is warm and dry. No rash noted. No erythema.  Ext: left foot wrapped Psychiatric: He has a normal mood and affect. His behavior is normal.    Lab Results Recent Labs    11/02/22 0332 11/03/22 0145  WBC  13.2* 14.0*  HGB 11.4* 11.5*  HCT 34.8* 35.3*  NA 133* 133*  K 4.8 4.8  CL 97* 97*  CO2 24 21*  BUN 37* 34*  CREATININE 1.09 1.11   Liver Panel Recent Labs    11/02/22 0332 11/03/22 0145  PROT 6.9 7.5  ALBUMIN 2.9* 3.1*  AST 13* 13*  ALT 10 12  ALKPHOS 63 76  BILITOT 0.5 0.7    Microbiology:       Methicillin resistant staphylococcus aureus      MIC    CIPROFLOXACIN >=8 RESISTANT Resistant    CLINDAMYCIN >=8 RESISTANT Resistant    ERYTHROMYCIN >=8 RESISTANT Resistant    GENTAMICIN <=0.5 SENSI... Sensitive    Inducible Clindamycin NEGATIVE Sensitive    LINEZOLID 2 SENSITIVE Sensitive    OXACILLIN >=4 RESISTANT Resistant    RIFAMPIN <=0.5 SENSI... Sensitive    TETRACYCLINE <=1 SENSITIVE Sensitive    TRIMETH/SULFA 160 RESISTANT Resistant    VANCOMYCIN <=0.5 SENSI... Sensitive           Studies/Results: PERIPHERAL VASCULAR CATHETERIZATION  Result Date: 11/03/2022 Images from the original result were not included.   Patient name: Jerryn Picchi  MRN: [id number]        DOB: April 28, 1966            Sex: male  11/03/2022 Pre-operative Diagnosis: Critical limb ischemia of the left lower extremity with tissue loss Post-operative diagnosis:  Same Surgeon:  Cephus Shelling, MD Procedure Performed: 1.  Ultrasound-guided access right common femoral artery 2.  Aortogram with catheter selection of aorta 3.  Left lower extremity arteriogram with selection of third order branches including the left below-knee popliteal artery 4.  Ultrasound-guided access left posterior tibial artery at the ankle retrograde with pedal sheath placement 5.  Left posterior tibial artery and tibioperoneal trunk angioplasty (3 mm x 220 mm Sterling) 6.  Mynx closure of the right common femoral artery 7.  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  He presents today for an  angiogram with a focus on the left leg with possible intervention after risk benefits discussed.  Findings:  Ultrasound-guided access right common femoral artery.  Aortogram showed both renals were patent as well as his infrarenal aorta and both iliac arteries were patent.  On the left he had a patent common femoral and profunda.  The SFA has some diffuse disease without flow-limiting stenosis and is patent.  The above and below-knee popliteal arteries patent.  Tibial trifurcation is occluded.  Dominant runoff was through a diseased peroneal that reconstituted distally and was occluded proximally with a collateral filling the posterior tibial at the ankle.  Initially tried to go antegrade and I could not get through his chronic total occlusion in the tibial trifurcation focusing on the peroneal.  I then elected to stick posterior tibial retrograde at the ankle.  Then I was able to get through his chronic total occlusion in the posterior tibial retrograde and snare my wire for through and through access.  The entire tibioperoneal trunk and posterior tibial artery was then angioplastied with a 3 mm x 220 mm Sterling.  I did go down and angioplastied the PT at the ankle with a 2 mm Sterling antegrade at completion.  Widely patent vessel at completion.  He now has inline flow through the posterior tibial artery.             Procedure:  The patient was identified in the holding area and taken to room 8.  The patient was then placed supine on the table and prepped and draped in the usual sterile fashion.  A time out was called.  The patient received Versed and fentanyl for conscious moderate sedation.  Vital signs were monitored including heart rate, respiratory rate, oxygenation and blood pressure.  I was present for all of moderate sedation.  Ultrasound was used to evaluate the right common femoral artery.  It was patent .  A digital ultrasound image was acquired.  A micropuncture needle was used to access the right  common femoral artery under ultrasound guidance.  An 018 wire was advanced without resistance and a micropuncture sheath was placed.  The 018 wire was removed and a benson wire was placed.  The micropuncture sheath was exchanged for a 5 french sheath.  An omniflush catheter was advanced over the wire to the level of L-1.  An abdominal angiogram was obtained.  Next, using the omniflush catheter and a benson wire, the aortic bifurcation was crossed and the catheter was placed into theleft external iliac artery and left runoff was obtained.  Pertinent findings are noted above.  We elected to try antegrade intervention on his tibial disease.  I used a Glidewire advantage down the left SFA and exchanged for a long 5 French sheath in the right groin with a  catapault sheath over the aortic bifurcation into the left SFA.  Patient was given 100 units/kg IV heparin.  I then went down with a V18 wire and a quick cross catheter and got into the below-knee popliteal artery.  I did hand injections here to identify the reconstitution of the peroneal distally and triued to get down the tibioperoneal trunk.  I could not get antegrade.  Eventually I did not think an antegrade attempt was going to be successful.  I then evaluated the posterior tibial at the ankle where it reconstituted and this was accessed under ultrasound guidance with a micro access needle placed a microsheath and then used to CXI catheter with a V18 to come retrograde and got through the long segment posterior tibial occlusion.  I did snare my wire from the sheath in the right groin over the aortic bifurcation as through and through access.  The entire TP trunk and posterior tibial artery was then angioplastied with a long 3 mm x 220 mm Sterling for 2 minutes from an antegrade approach.  Final imaging showed excellent flow down to the ankle where there appeared to be residual stenosis.  I then pulled the pedal sheath and got my wire down into the plantar arch and  then I angioplastied across the posterior tibial at the ankle with a 2 mm Sterling.  We gave nitro.  We then had widely patent vessel with inline flow down the foot.  Wires and catheters removed.  Mynx closure in the right groin.  Plan: Patient now has inline flow down the left lower extremity through the posterior tibial after retrograde access.  Optimized from vascular surgery standpoint.  Aspirin Plavix.  Statin allergy.    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 Narrow abtx to vancomycin and d/c cefepime. Patient reports nausea and sensitive stomach to previous oral abtx regimen (likely clindamycin) can do mop up, with 1 dose of oritavancin on day of discharge, then start oral doxycycline 100mg  po bid (for 2wk) roughly 7 days from infusion.  Will check sed rate and crp Unclear if further interventions needed from podiatry.   Would give anti emetics and take doxy on full stomach as part of patient instructions.  Will need to follow up in the ID clinic to see if abtx need further extention Continue with wound care with podiatry follow up  [facility] Pager: [phone]  11/03/2022, 3:55 PM

## 2022-11-03 NOTE — Progress Notes (Signed)
PROGRESS NOTE    Kenneth Bailey  ZOX:[id number] DOB: 1966-04-03 DOA: 10/30/2022 PCP: [name], MD   Brief Narrative:  The patient is a 57 year old chronically ill-appearing Caucasian male with a past medical history significant for but not limited to uncontrolled diabetes mellitus type 2, CAD status post CABG, chronic combined systolic and diastolic CHF, tobacco abuse as well as other comorbidities who presented to [facility] emergency department at the recommendation of his podiatrist given his persistent left foot pain and concern for osteomyelitis of the left fifth toe.  He has been suffering with a left foot diabetic ulcer for approximately 4 weeks and will prescribe a course of antibiotics through the hospital hospital ED and was referred to podiatry who did follow him for approximate 1 week.  Imaging was done and the podiatrist evaluated and there is finding concerns for osteomyelitis.  Patient was contacted and instructed to go to [name] ED.  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    He was taken to the OR and is status post left foot incision and drainage with resection of fifth metatarsal head on 6/11 with cultures currently growing MRSA.  ID has been consulted for further evaluation recommendations. Given proximal incision necrosis, Podiatry recommends Vascular Consult for evaluation for wound healing and Vascular consulted and recommending obtaining an angiogram of the left lower leg with possible intervention through transfemoral access given that they suspect that he has tibial disease.  Patient was taken to the Cath Lab today and he underwent a left posterior tibial artery and tibioperoneal trunk angioplasty and has now been placed on aspirin and Plavix but no statin  given his allergy.  Vascular surgery feels that he is optimized from their standpoint PT to reevaluate and treat.  Assessment and Plan:  Left Fifth Toe and Lateral Foot Cellulitis with Osteomyelitis -Continued empiric Antibiotics with IV Cefepime q grams q12h and IV Vancomycin; Infectious disease has now stopped the IV cefepime and recommending giving the patient Oritavancin or Dalbavancin tomorrow (11/04/22) as the long acting and then have patient do additional 14 day course of Doxycycline 100mg  po bid to start on next Saturday on 11/11/22.  -ABIs done and showed "Right: Resting right  ankle-brachial index indicates moderate right lower  extremity arterial disease. The right toe-brachial index is abnormal. 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 [place], [place] [address]  Culture FEW METHICILLIN RESISTANT STAPHYLOCOCCUS AUREUS NO ANAEROBES ISOLATED; CULTURE IN PROGRESS FOR 5 DAYS  Report Status PENDING  Organism ID, Bacteria METHICILLIN  RESISTANT STAPHYLOCOCCUS AUREUS  Resulting Agency [facility]     Susceptibility    Methicillin resistant staphylococcus aureus    MIC    CIPROFLOXACIN >=8 RESISTANT Resistant    CLINDAMYCIN >=8 RESISTANT  Resistant    ERYTHROMYCIN >=8 RESISTANT Resistant    GENTAMICIN <=0.5 SENSI... Sensitive    Inducible Clindamycin NEGATIVE Sensitive    LINEZOLID 2 SENSITIVE Sensitive    OXACILLIN >=4 RESISTANT Resistant    RIFAMPIN <=0.5 SENSI... Sensitive    TETRACYCLINE <=1 SENSITIVE Sensitive    TRIMETH/SULFA 160 RESISTANT Resistant    VANCOMYCIN <=0.5 SENSI... Sensitive         -Continue with po Oxycodone 5 mg po q6hprn Moderate/Breakthrough Pain and IV Hydromorphone 0.5 mg IV q4hprn Severe Pain  -Consulted ID for further evaluation and recommendations given his Infection and Resection however podiatry feels that there is surgical cure of his bone infection; ID recommends at least a 4-week treatment but can consider giving a long-acting prior to discharge such as Dalvance or about advancing but recommending continuing on IV cefepime and IV vancomycin for the moment and following up on cultures -Podiatry is recommending heel weightbearing in the postoperative shoe -PT OT consulted however patient's pain limited participation and will need to re-engage -Patient now has some necrosis of the proximal incision the podiatrist has recommended obtaining a vascular consultation given his concern for wound healing and vascular Dr. Chestine Spore been consulted -Vascular did an Abdominal aortogram with left lower extremity runoff  and he had a left posterior tibial artery and tibioperoneal trunk angioplasty and was placed on Plavix -Vascular feels that the patient has inline flow down the left lower extremity to the posterior tibial artery retrograde after his and feels that he is optimized from a vascular standpoint and recommending Aspirin Plavix   Acute Kidney Injury, improved -Likely prerenal in setting of infection with poor intake -Baseline creatinine approximately 1.3 with creatinine 2.9 at presentation  -BUN/Cr Trend: Recent Labs  Lab 10/30/22 1658 10/31/22 0450 11/01/22 0059 11/02/22 0332 11/03/22 0145  BUN  48* 49* 53* 37* 34*  CREATININE 2.91* 2.01* 1.47* 1.09 1.11  -IVF now stopped  -Avoid Nephrotoxic Medications, Contrast Dyes, Hypotension and Dehydration to Ensure Adequate Renal Perfusion and will need to Renally Adjust Meds -Continue to Monitor and Trend Renal Function carefully and repeat CMP in the AM   Chronic Combined Systolic and Diastolic CHF -Euvolemic/somewhat dehydrated at presentation -follow with gentle volume expansion  Intake/Output Summary (Last 24 hours) at 11/03/2022 1607 Last data filed at 11/03/2022 1509 Gross per 24 hour  Intake 960.33 ml  Output 900 ml  Net 60.33 ml  -Strict I's and O's and Daily Weights -IVF now stopped  -Continue to Monitor for Signs and Symptoms of Volume Overload   Tobacco Abuse -Has been counseled on the absolute need to discontinue Tobacco completely -Continue with nicotine patch 14 mg transdermal every 24 hours  HLD -C/w Gemfibrozil 600 mg po BID    CAD status post CABG -Asymptomatic at present -Continue with Aspirin 81 mg p.o. daily; Now on Clopidogrel 75 mg po Daily given Above -Not on a Statin due to Adverse Effects/Allergies    Uncontrolled DM2 -CBG controlled as an inpatient  -Recent A1c was 10.7 -C/w Diabetic education -Continue to Monitor CBGs per protocol; CBG Trend: Recent Labs  Lab 11/01/22 2002 11/02/22 0813 11/02/22 1112 11/02/22 1559 11/02/22 2308 11/03/22 0720 11/03/22 1145  GLUCAP 173* 151* 270* 129* 176* 171* 157*   Normocytic Anemia -Hgb/Hct Trend: Recent  Labs  Lab 10/30/22 1658 10/31/22 0450 11/01/22 0059 11/02/22 0332 11/03/22 0145  HGB 13.8 12.0* 12.0* 11.4* 11.5*  HCT 43.0 36.9* 36.9* 34.8* 35.3*  MCV 83.7 85.0 83.9 84.5 84.9  -Checked Anemia Panel and showed an iron level of 17, UIBC of 299, TIBC 360, saturation ratios 5%, ferritin 193, folate 9.5, vitamin B12 165 -Will start po Iron Supplementation with Niferex 150 mg po Daily  -Continue to Monitor for S/Sx of Bleeding; No overt bleeding  noted -Repeat CBC in the AM   DVT prophylaxis: enoxaparin (LOVENOX) injection 40 mg Start: 11/01/22 1000 SCDs Start: 10/30/22 2211    Code Status: Full Code Family Communication: No family Currently at bedside  Disposition Plan:  Level of care: Progressive Cardiac Status is: Inpatient Remains inpatient appropriate because: Needs Specialist Clearance and Anticipating D/C   Consultants:  Podiatry Infectious Diseases Vascular Surgery  Procedures:  POD #2 s/p fifth metatarsal head resection and irrigation and debridement   Antimicrobials:  Anti-infectives (From admission, onward)    Start     Dose/Rate Route Frequency Ordered Stop   11/11/22 1000  doxycycline (VIBRA-TABS) tablet 100 mg        100 mg Oral Every 12 hours 11/03/22 1616 11/25/22 0959   11/04/22 0800  Oritavancin Diphosphate (ORBACTIV) 1,200 mg in dextrose 5 % IVPB        1,200 mg 333.3 mL/hr over 180 Minutes Intravenous Once 11/03/22 1611     11/03/22 0500  vancomycin (VANCOREADY) IVPB 1500 mg/300 mL  Status:  Discontinued        1,500 mg 150 mL/hr over 120 Minutes Intravenous Every 24 hours 11/02/22 1439 11/03/22 1611   11/02/22 1445  ceFEPIme (MAXIPIME) 2 g in sodium chloride 0.9 % 100 mL IVPB  Status:  Discontinued        2 g 200 mL/hr over 30 Minutes Intravenous Every 8 hours 11/02/22 1437 11/03/22 1430   11/02/22 0500  vancomycin (VANCOREADY) IVPB 1250 mg/250 mL  Status:  Discontinued        1,250 mg 166.7 mL/hr over 90 Minutes Intravenous Every 24 hours 11/01/22 0721 11/02/22 1439   11/01/22 0430  vancomycin (VANCOCIN) IVPB 1000 mg/200 mL premix        1,000 mg 200 mL/hr over 60 Minutes Intravenous  Once 11/01/22 0339 11/01/22 0620   11/01/22 0430  ceFEPIme (MAXIPIME) 2 g in sodium chloride 0.9 % 100 mL IVPB  Status:  Discontinued        2 g 200 mL/hr over 30 Minutes Intravenous Every 12 hours 11/01/22 0342 11/02/22 1437   11/01/22 0400  ceFEPIme (MAXIPIME) 2 g in sodium chloride 0.9 % 100 mL IVPB  Status:   Discontinued        2 g 200 mL/hr over 30 Minutes Intravenous Every 24 hours 10/31/22 0342 11/01/22 0341   10/31/22 0430  ceFEPIme (MAXIPIME) 2 g in sodium chloride 0.9 % 100 mL IVPB        2 g 200 mL/hr over 30 Minutes Intravenous  Once 10/31/22 0342 10/31/22 0434   10/30/22 2138  vancomycin variable dose per unstable renal function (pharmacist dosing)  Status:  Discontinued         Does not apply See admin instructions 10/30/22 2138 11/01/22 0721   10/30/22 1745  vancomycin (VANCOREADY) IVPB 1500 mg/300 mL        1,500 mg 150 mL/hr over 120 Minutes Intravenous  Once 10/30/22 1743 10/30/22 2058       Subjective: Seen and examined  at bedside and the patient was doing okay any complaints of pain today.  No nausea or vomiting.  Wanting to eat.  Feels a little bit better.  No other concerns or complaints this time.  Objective: Vitals:   11/03/22 1008 11/03/22 1010 11/03/22 1015 11/03/22 1049  BP: 119/78 107/71 111/72 127/74  Pulse: 76 73 92 75  Resp: 14 18 17 18   Temp:    98 F (36.7 C)  TempSrc:    Oral  SpO2: 94% 95% 95% 98%  Weight:      Height:        Intake/Output Summary (Last 24 hours) at 11/03/2022 1618 Last data filed at 11/03/2022 1509 Gross per 24 hour  Intake 960.33 ml  Output 900 ml  Net 60.33 ml   Filed Weights   10/31/22 1226 11/01/22 1151 11/02/22 0500  Weight: 72.5 kg 70.9 kg 71.3 kg   Examination: Physical Exam:  Constitutional: Chronically ill-appearing older appearing than his stated age appears a little more comfortable Respiratory: Diminished to auscultation bilaterally, no wheezing, rales, rhonchi or crackles. Normal respiratory effort and patient is not tachypenic. No accessory muscle use. Unlabored breathing  Cardiovascular: RRR, no murmurs / rubs / gallops. S1 and S2 auscultated.  Left foot is wrapped Abdomen: Soft, non-tender, non-distended. Bowel sounds positive.  GU: Deferred. Musculoskeletal: No clubbing / cyanosis of digits/nails.  Has a  recent left fifth metatarsal head that has been resected Skin: No rashes, lesions, ulcers on limited skin evaluation. No induration; Warm and dry.  Neurologic: CN 2-12 grossly intact with no focal deficits. Romberg sign and cerebellar reflexes not assessed.  Psychiatric: Normal judgment and insight. Alert and oriented x 3. Normal mood and appropriate affect.   Data Reviewed: I have personally reviewed following labs and imaging studies  CBC: Recent Labs  Lab 10/30/22 1658 10/31/22 0450 11/01/22 0059 11/02/22 0332 11/03/22 0145  WBC 21.2* 14.2* 16.5* 13.2* 14.0*  NEUTROABS  --   --   --  10.5* 11.8*  HGB 13.8 12.0* 12.0* 11.4* 11.5*  HCT 43.0 36.9* 36.9* 34.8* 35.3*  MCV 83.7 85.0 83.9 84.5 84.9  PLT 402* 290 353 306 326   Basic Metabolic Panel: Recent Labs  Lab 10/30/22 1658 10/31/22 0450 11/01/22 0059 11/02/22 0332 11/03/22 0145  NA 135 137 135 133* 133*  K 4.7 3.8 4.6 4.8 4.8  CL 95* 100 97* 97* 97*  CO2 21* 22 22 24  21*  GLUCOSE 183* 178* 152* 153* 176*  BUN 48* 49* 53* 37* 34*  CREATININE 2.91* 2.01* 1.47* 1.09 1.11  CALCIUM 10.2 9.4 9.6 9.6 9.9  MG  --  2.0 2.2 1.8 1.7  PHOS  --  5.0*  --  3.0 4.0   GFR: Estimated Creatinine Clearance: 72.7 mL/min (by C-G formula based on SCr of 1.11 mg/dL). Liver Function Tests: Recent Labs  Lab 11/02/22 0332 11/03/22 0145  AST 13* 13*  ALT 10 12  ALKPHOS 63 76  BILITOT 0.5 0.7  PROT 6.9 7.5  ALBUMIN 2.9* 3.1*   No results for input(s): "LIPASE", "AMYLASE" in the last 168 hours. No results for input(s): "AMMONIA" in the last 168 hours. Coagulation Profile: No results for input(s): "INR", "PROTIME" in the last 168 hours. Cardiac Enzymes: No results for input(s): "CKTOTAL", "CKMB", "CKMBINDEX", "TROPONINI" in the last 168 hours. BNP (last 3 results) No results for input(s): "PROBNP" in the last 8760 hours. HbA1C: No results for input(s): "HGBA1C" in the last 72 hours. CBG: Recent Labs  Lab 11/02/22 1112  11/02/22 1559 11/02/22 2308 11/03/22 0720 11/03/22 1145  GLUCAP 270* 129* 176* 171* 157*   Lipid Profile: No results for input(s): "CHOL", "HDL", "LDLCALC", "TRIG", "CHOLHDL", "LDLDIRECT" in the last 72 hours. Thyroid Function Tests: No results for input(s): "TSH", "T4TOTAL", "FREET4", "T3FREE", "THYROIDAB" in the last 72 hours. Anemia Panel: Recent Labs    11/03/22 0145  VITAMINB12 355  FOLATE 9.5  FERRITIN 193  TIBC 316  IRON 17*  RETICCTPCT 1.1   Sepsis Labs: Recent Labs  Lab 10/30/22 1658 10/30/22 2005  LATICACIDVEN 2.4* 1.2    Recent Results (from the past 240 hour(s))  WOUND CULTURE     Status: Abnormal   Collection Time: 10/25/22  4:22 PM   Specimen: Foot, Left; Wound  Result Value Ref Range Status   MICRO NUMBER: [id number]  Final   SPECIMEN QUALITY: Adequate  Final   SOURCE: WOUND (SITE NOT SPECIFIED)  Final   STATUS: FINAL  Final   GRAM STAIN:   Final    No white blood cells seen No epithelial cells seen Moderate Gram positive cocci in pairs   RESULT:   Final    Additional organisms of questionable significance were isolated that normally do not warrant identification and susceptibilities. Please contact the laboratory within three days if identification and susceptibilities are clinically indicated.   ISOLATE 1: methicillin resistant Staphylococcus aureus (A)  Final    Comment: Moderate growth of Methicillin resistant Staphylococcus aureus (MRSA)      Susceptibility   Methicillin resistant staphylococcus aureus - AEROBIC CULT, GRAM STAIN POSITIVE 1    VANCOMYCIN 1 Sensitive     CIPROFLOXACIN >=8 Resistant     CLINDAMYCIN >=8 Resistant     LEVOFLOXACIN 4 Intermediate     ERYTHROMYCIN >=8 Resistant     GENTAMICIN <=0.5 Sensitive     OXACILLIN* NR Resistant      * Oxacillin-resistant staphylococci are resistant to all currently available beta-lactam antimicrobial agents with the possible exception of ceftaroline.     TETRACYCLINE <=1 Sensitive      TRIMETH/SULFA* >=320 Resistant      * Oxacillin-resistant staphylococci are resistant to all currently available beta-lactam antimicrobial agents with the possible exception of ceftaroline. Legend: S = Susceptible  I = Intermediate R = Resistant  NS = Not susceptible * = Not tested  NR = Not reported **NN = See antimicrobic comments   Blood culture (routine x 2)     Status: None (Preliminary result)   Collection Time: 10/30/22  4:58 PM   Specimen: BLOOD  Result Value Ref Range Status   Specimen Description BLOOD LEFT FOREARM  Final   Special Requests   Final    BOTTLES DRAWN AEROBIC AND ANAEROBIC Blood Culture adequate volume   Culture   Final    NO GROWTH 4 DAYS Performed at [facility], [address], [place], [place] [address]    Report Status PENDING  Incomplete  Blood culture (routine x 2)     Status: None (Preliminary result)   Collection Time: 10/30/22  5:11 PM   Specimen: BLOOD  Result Value Ref Range Status   Specimen Description BLOOD RIGHT ANTECUBITAL  Final   Special Requests   Final    BOTTLES DRAWN AEROBIC AND ANAEROBIC Blood Culture results may not be optimal due to an excessive volume of blood received in culture bottles   Culture   Final    NO GROWTH 4 DAYS Performed at [facility], [address] [place], [place]  [address]    Report Status PENDING  Incomplete  MRSA Next Gen by PCR, Nasal     Status: None   Collection Time: 10/31/22  3:44 AM   Specimen: Nasal Mucosa; Nasal Swab  Result Value Ref Range Status   MRSA by PCR Next Gen NOT DETECTED NOT DETECTED Final    Comment: (NOTE) The GeneXpert MRSA Assay (FDA approved for NASAL specimens only), is one component of a comprehensive MRSA colonization surveillance program. It is not intended to diagnose MRSA infection nor to guide or monitor treatment for MRSA infections. Test performance is not FDA approved in patients less than 60 years old. Performed at [facility], [address] [place], [place] [address]   Aerobic/Anaerobic Culture w Gram Stain (surgical/deep wound)     Status: None (Preliminary result)   Collection Time: 10/31/22  1:24 PM   Specimen: Toe, Left; Amputation  Result Value Ref Range Status   Specimen Description WOUND LEFT TOE  Final   Special Requests 5TH METATARSAL HEAD PT ON VANC CEFEPIME  Final   Gram Stain   Final    NO WBC SEEN RARE GRAM POSITIVE COCCI IN PAIRS RARE GRAM NEGATIVE RODS Performed at [facility], [address], [place], [place] [address]    Culture   Final    FEW METHICILLIN RESISTANT STAPHYLOCOCCUS AUREUS NO ANAEROBES ISOLATED; CULTURE IN PROGRESS FOR 5 DAYS    Report Status PENDING  Incomplete   Organism ID, Bacteria METHICILLIN RESISTANT STAPHYLOCOCCUS AUREUS  Final      Susceptibility   Methicillin resistant staphylococcus aureus - MIC*    CIPROFLOXACIN >=8 RESISTANT Resistant     ERYTHROMYCIN >=8 RESISTANT Resistant     GENTAMICIN <=0.5 SENSITIVE Sensitive     OXACILLIN >=4 RESISTANT Resistant     TETRACYCLINE <=1 SENSITIVE Sensitive     VANCOMYCIN <=0.5 SENSITIVE Sensitive     TRIMETH/SULFA 160 RESISTANT Resistant     CLINDAMYCIN >=8 RESISTANT Resistant     RIFAMPIN <=0.5 SENSITIVE Sensitive     Inducible Clindamycin NEGATIVE Sensitive     LINEZOLID 2 SENSITIVE Sensitive     * FEW METHICILLIN RESISTANT STAPHYLOCOCCUS AUREUS  Surgical pcr screen     Status: None   Collection Time: 11/03/22  7:07 AM   Specimen: Nasal Mucosa; Nasal Swab  Result Value Ref Range Status   MRSA, PCR NEGATIVE NEGATIVE Final   Staphylococcus aureus NEGATIVE NEGATIVE Final    Comment: (NOTE) The Xpert SA Assay (FDA approved for NASAL specimens in patients 71 years of age and older), is one component of a comprehensive surveillance program. It is not intended to diagnose infection nor to guide or monitor treatment. Performed at [facility], [address], [place], [place] [address]     Radiology  Studies: PERIPHERAL VASCULAR CATHETERIZATION  Result Date: 11/03/2022 Images from the original result were not included.   Patient name: Murad Pamphile  MRN: [id number]        DOB: July 06, 1965            Sex: male  11/03/2022 Pre-operative Diagnosis: Critical limb ischemia of the left lower extremity with tissue loss Post-operative diagnosis:  Same Surgeon:  Cephus Shelling, MD Procedure Performed: 1.  Ultrasound-guided access right common femoral artery 2.  Aortogram with catheter selection of aorta 3.  Left lower extremity arteriogram with selection of third order branches including the left below-knee popliteal artery 4.  Ultrasound-guided access left posterior tibial artery at the ankle  retrograde with pedal sheath placement 5.  Left posterior tibial artery and tibioperoneal trunk angioplasty (3 mm x 220 mm Sterling) 6.  Mynx closure of the right common femoral artery 7.  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  He presents today for an angiogram with a focus on the left leg with possible intervention after risk benefits discussed.  Findings:  Ultrasound-guided access right common femoral artery.  Aortogram showed both renals were patent as well as his infrarenal aorta and both iliac arteries were patent.  On the left he had a patent common femoral and profunda.  The SFA has some diffuse disease without flow-limiting stenosis and is patent.  The above and below-knee popliteal arteries patent.  Tibial trifurcation is occluded.  Dominant runoff was through a diseased peroneal that reconstituted distally and was occluded proximally with a collateral filling the posterior tibial at the ankle.  Initially tried to go antegrade and I could not get through his chronic total occlusion in the tibial trifurcation focusing on the peroneal.  I then elected to stick posterior  tibial retrograde at the ankle.  Then I was able to get through his chronic total occlusion in the posterior tibial retrograde and snare my wire for through and through access.  The entire tibioperoneal trunk and posterior tibial artery was then angioplastied with a 3 mm x 220 mm Sterling.  I did go down and angioplastied the PT at the ankle with a 2 mm Sterling antegrade at completion.  Widely patent vessel at completion.  He now has inline flow through the posterior tibial artery.             Procedure:  The patient was identified in the holding area and taken to room 8.  The patient was then placed supine on the table and prepped and draped in the usual sterile fashion.  A time out was called.  The patient received Versed and fentanyl for conscious moderate sedation.  Vital signs were monitored including heart rate, respiratory rate, oxygenation and blood pressure.  I was present for all of moderate sedation.  Ultrasound was used to evaluate the right common femoral artery.  It was patent .  A digital ultrasound image was acquired.  A micropuncture needle was used to access the right common femoral artery under ultrasound guidance.  An 018 wire was advanced without resistance and a micropuncture sheath was placed.  The 018 wire was removed and a benson wire was placed.  The micropuncture sheath was exchanged for a 5 french sheath.  An omniflush catheter was advanced over the wire to the level of L-1.  An abdominal angiogram was obtained.  Next, using the omniflush catheter and a benson wire, the aortic bifurcation was crossed and the catheter was placed into theleft external iliac artery and left runoff was obtained.  Pertinent findings are noted above.  We elected to try antegrade intervention on his tibial disease.  I used a Glidewire advantage down the left SFA and exchanged for a long 5 French sheath in the right groin with a catapault sheath over the aortic bifurcation into the left SFA.  Patient was given  100 units/kg IV heparin.  I then went down with a V18 wire and a quick cross catheter and got into the below-knee popliteal artery.  I did hand injections here to identify the reconstitution of the peroneal distally and  triued to get down the tibioperoneal trunk.  I could not get antegrade.  Eventually I did not think an antegrade attempt was going to be successful.  I then evaluated the posterior tibial at the ankle where it reconstituted and this was accessed under ultrasound guidance with a micro access needle placed a microsheath and then used to CXI catheter with a V18 to come retrograde and got through the long segment posterior tibial occlusion.  I did snare my wire from the sheath in the right groin over the aortic bifurcation as through and through access.  The entire TP trunk and posterior tibial artery was then angioplastied with a long 3 mm x 220 mm Sterling for 2 minutes from an antegrade approach.  Final imaging showed excellent flow down to the ankle where there appeared to be residual stenosis.  I then pulled the pedal sheath and got my wire down into the plantar arch and then I angioplastied across the posterior tibial at the ankle with a 2 mm Sterling.  We gave nitro.  We then had widely patent vessel with inline flow down the foot.  Wires and catheters removed.  Mynx closure in the right groin.  Plan: Patient now has inline flow down the left lower extremity through the posterior tibial after retrograde access.  Optimized from vascular surgery standpoint.  Aspirin Plavix.  Statin allergy.    Cephus Shelling, MD [facility] Office: [phone]    Scheduled Meds:  aspirin EC  81 mg Oral Daily   [START ON 11/04/2022] clopidogrel  75 mg Oral Daily   [START ON 11/11/2022] doxycycline  100 mg Oral Q12H   enoxaparin (LOVENOX) injection  40 mg Subcutaneous Q24H   gemfibrozil  600 mg Oral BID AC   insulin aspart  0-5 Units Subcutaneous QHS   insulin aspart  0-9  Units Subcutaneous TID WC   iron polysaccharides  150 mg Oral Daily   melatonin  10 mg Oral QHS   nicotine  14 mg Transdermal Daily   sodium chloride flush  3 mL Intravenous Q12H   Continuous Infusions:  sodium chloride 100 mL/hr at 11/03/22 1035   sodium chloride     [START ON 11/04/2022] Oritavancin Diphosphate (ORBACTIV) 1,200 mg in dextrose 5 % IVPB      LOS: 4 days   Marguerita Merles, DO [facility] Available via Epic secure chat 7am-7pm After these hours, please refer to coverage provider listed on [email or web address] 11/03/2022, 4:18 PM

## 2022-11-03 NOTE — Progress Notes (Signed)
[facility]  Subjective  - no complaints   Objective (!) 155/83 87 97.9 F (36.6 C) (Oral) 15 99%  Intake/Output Summary (Last 24 hours) at 11/03/2022 0801 Last data filed at 11/03/2022 0539 Gross per 24 hour  Intake 100 ml  Output 1700 ml  Net -1600 ml    Left femoral and popliteal pulse palpable with no palpable pedal pulses status post fifth metatarsal head resection  Laboratory Lab Results: Recent Labs    11/02/22 0332 11/03/22 0145  WBC 13.2* 14.0*  HGB 11.4* 11.5*  HCT 34.8* 35.3*  PLT 306 326   BMET Recent Labs    11/02/22 0332 11/03/22 0145  NA 133* 133*  K 4.8 4.8  CL 97* 97*  CO2 24 21*  GLUCOSE 153* 176*  BUN 37* 34*  CREATININE 1.09 1.11  CALCIUM 9.6 9.9    COAG Lab Results  Component Value Date   INR 1.3 (H) 06/10/2021   INR 1.0 06/10/2021   No results found for: "PTT"  Assessment/Planning:  Plan aortogram, lower extremity arteriogram, possible intervention with focus on the left lower extremity given some necrosis of incision after left fifth metatarsal head resection by podiatry.  Risk benefits discussed.  Kenneth Bailey 11/03/2022 8:01 AM --

## 2022-11-03 NOTE — Progress Notes (Signed)
PT Cancellation Note  Patient Details Name: Kenneth Bailey MRN: [id number] DOB: 03-14-1966   Cancelled Treatment:    Reason Eval/Treat Not Completed: Patient at procedure or test/unavailable (Pt off floor for procedure and transferred to another unit.)   Johny Shock 11/03/2022, 11:36 AM

## 2022-11-04 ENCOUNTER — Inpatient Hospital Stay (HOSPITAL_COMMUNITY): Payer: BC Managed Care – PPO

## 2022-11-04 DIAGNOSIS — K921 Melena: Secondary | ICD-10-CM

## 2022-11-04 DIAGNOSIS — E1165 Type 2 diabetes mellitus with hyperglycemia: Secondary | ICD-10-CM | POA: Diagnosis not present

## 2022-11-04 DIAGNOSIS — K922 Gastrointestinal hemorrhage, unspecified: Secondary | ICD-10-CM | POA: Diagnosis not present

## 2022-11-04 DIAGNOSIS — M86172 Other acute osteomyelitis, left ankle and foot: Secondary | ICD-10-CM | POA: Diagnosis not present

## 2022-11-04 DIAGNOSIS — K920 Hematemesis: Secondary | ICD-10-CM

## 2022-11-04 DIAGNOSIS — R55 Syncope and collapse: Secondary | ICD-10-CM

## 2022-11-04 DIAGNOSIS — L02612 Cutaneous abscess of left foot: Secondary | ICD-10-CM | POA: Diagnosis not present

## 2022-11-04 LAB — MAGNESIUM: Magnesium: 1.5 mg/dL — ABNORMAL LOW (ref 1.7–2.4)

## 2022-11-04 LAB — COMPREHENSIVE METABOLIC PANEL
ALT: 15 U/L (ref 0–44)
ALT: 15 U/L (ref 0–44)
AST: 15 U/L (ref 15–41)
AST: 15 U/L (ref 15–41)
Albumin: 2.4 g/dL — ABNORMAL LOW (ref 3.5–5.0)
Albumin: 2.4 g/dL — ABNORMAL LOW (ref 3.5–5.0)
Alkaline Phosphatase: 66 U/L (ref 38–126)
Alkaline Phosphatase: 63 U/L (ref 38–126)
Anion gap: 12 (ref 5–15)
Anion gap: 12 (ref 5–15)
BUN: 39 mg/dL — ABNORMAL HIGH (ref 6–20)
BUN: 59 mg/dL — ABNORMAL HIGH (ref 6–20)
CO2: 24 mmol/L (ref 22–32)
CO2: 23 mmol/L (ref 22–32)
Calcium: 9.1 mg/dL (ref 8.9–10.3)
Calcium: 9.2 mg/dL (ref 8.9–10.3)
Chloride: 97 mmol/L — ABNORMAL LOW (ref 98–111)
Chloride: 97 mmol/L — ABNORMAL LOW (ref 98–111)
Creatinine, Ser: 1.03 mg/dL (ref 0.61–1.24)
Creatinine, Ser: 1.12 mg/dL (ref 0.61–1.24)
GFR, Estimated: >60 mL/min (ref >60)
GFR, Estimated: >60 mL/min (ref >60)
Glucose, Bld: 211 mg/dL — ABNORMAL HIGH (ref 70–99)
Glucose, Bld: 325 mg/dL — ABNORMAL HIGH (ref 70–99)
Potassium: 4.1 mmol/L (ref 3.5–5.1)
Potassium: 4.5 mmol/L (ref 3.5–5.1)
Sodium: 133 mmol/L — ABNORMAL LOW (ref 135–145)
Sodium: 132 mmol/L — ABNORMAL LOW (ref 135–145)
Total Bilirubin: 0.7 mg/dL (ref 0.3–1.2)
Total Bilirubin: 0.4 mg/dL (ref 0.3–1.2)
Total Protein: 6.6 g/dL (ref 6.5–8.1)
Total Protein: 6.4 g/dL — ABNORMAL LOW (ref 6.5–8.1)

## 2022-11-04 LAB — CBC WITH DIFFERENTIAL/PLATELET
Abs Immature Granulocytes: 0.18 10*3/uL — ABNORMAL HIGH (ref 0.00–0.07)
Abs Immature Granulocytes: 0.52 10*3/uL — ABNORMAL HIGH (ref 0.00–0.07)
Basophils Absolute: 0.1 10*3/uL (ref 0.0–0.1)
Basophils Absolute: 0.1 10*3/uL (ref 0.0–0.1)
Basophils Relative: 1 %
Basophils Relative: 1 %
Eosinophils Absolute: 0.1 10*3/uL (ref 0.0–0.5)
Eosinophils Absolute: 0.1 10*3/uL (ref 0.0–0.5)
Eosinophils Relative: 1 %
Eosinophils Relative: 0 %
HCT: 29.3 % — ABNORMAL LOW (ref 39.0–52.0)
HCT: 21 % — ABNORMAL LOW (ref 39.0–52.0)
Hemoglobin: 9.7 g/dL — ABNORMAL LOW (ref 13.0–17.0)
Hemoglobin: 7 g/dL — ABNORMAL LOW (ref 13.0–17.0)
Immature Granulocytes: 1 %
Immature Granulocytes: 3 %
Lymphocytes Relative: 8 %
Lymphocytes Relative: 6 %
Lymphs Abs: 1 10*3/uL (ref 0.7–4.0)
Lymphs Abs: 1 10*3/uL (ref 0.7–4.0)
MCH: 27 pg (ref 26.0–34.0)
MCH: 28.2 pg (ref 26.0–34.0)
MCHC: 33.1 g/dL (ref 30.0–36.0)
MCHC: 33.3 g/dL (ref 30.0–36.0)
MCV: 81.6 fL (ref 80.0–100.0)
MCV: 84.7 fL (ref 80.0–100.0)
Monocytes Absolute: 0.9 10*3/uL (ref 0.1–1.0)
Monocytes Absolute: 1 10*3/uL (ref 0.1–1.0)
Monocytes Relative: 7 %
Monocytes Relative: 6 %
Neutro Abs: 10.6 10*3/uL — ABNORMAL HIGH (ref 1.7–7.7)
Neutro Abs: 14.9 10*3/uL — ABNORMAL HIGH (ref 1.7–7.7)
Neutrophils Relative %: 82 %
Neutrophils Relative %: 84 %
Platelets: 305 10*3/uL (ref 150–400)
Platelets: 374 10*3/uL (ref 150–400)
RBC: 3.59 MIL/uL — ABNORMAL LOW (ref 4.22–5.81)
RBC: 2.48 MIL/uL — ABNORMAL LOW (ref 4.22–5.81)
RDW: 13.7 % (ref 11.5–15.5)
RDW: 13.6 % (ref 11.5–15.5)
WBC: 12.9 10*3/uL — ABNORMAL HIGH (ref 4.0–10.5)
WBC: 17.5 10*3/uL — ABNORMAL HIGH (ref 4.0–10.5)
nRBC: 0 % (ref 0.0–0.2)
nRBC: 0 % (ref 0.0–0.2)

## 2022-11-04 LAB — TYPE AND SCREEN
ABO/RH(D): O POS
Unit division: 0
Unit division: 0

## 2022-11-04 LAB — PROTIME-INR
INR: 1.2 (ref 0.8–1.2)
Prothrombin Time: 15.3 seconds — ABNORMAL HIGH (ref 11.4–15.2)

## 2022-11-04 LAB — LIPID PANEL
Cholesterol: 148 mg/dL (ref 0–200)
HDL: 21 mg/dL — ABNORMAL LOW (ref >40)
LDL Cholesterol: 76 mg/dL (ref 0–99)
Total CHOL/HDL Ratio: 7 RATIO
Triglycerides: 253 mg/dL — ABNORMAL HIGH (ref <150)
VLDL: 51 mg/dL — ABNORMAL HIGH (ref 0–40)

## 2022-11-04 LAB — LACTIC ACID, PLASMA
Lactic Acid, Venous: 1.8 mmol/L (ref 0.5–1.9)
Lactic Acid, Venous: 1.3 mmol/L (ref 0.5–1.9)

## 2022-11-04 LAB — GLUCOSE, CAPILLARY
Glucose-Capillary: 208 mg/dL — ABNORMAL HIGH (ref 70–99)
Glucose-Capillary: 254 mg/dL — ABNORMAL HIGH (ref 70–99)
Glucose-Capillary: 252 mg/dL — ABNORMAL HIGH (ref 70–99)
Glucose-Capillary: 348 mg/dL — ABNORMAL HIGH (ref 70–99)
Glucose-Capillary: 291 mg/dL — ABNORMAL HIGH (ref 70–99)

## 2022-11-04 LAB — PREPARE RBC (CROSSMATCH)

## 2022-11-04 LAB — BPAM RBC
Blood Product Expiration Date: 202407162359
ISSUE DATE / TIME: 202406152203

## 2022-11-04 LAB — HEMOGLOBIN AND HEMATOCRIT, BLOOD
HCT: 21.9 % — ABNORMAL LOW (ref 39.0–52.0)
Hemoglobin: 7.1 g/dL — ABNORMAL LOW (ref 13.0–17.0)

## 2022-11-04 LAB — APTT: aPTT: 31 seconds (ref 24–36)

## 2022-11-04 LAB — OCCULT BLOOD X 1 CARD TO LAB, STOOL: Fecal Occult Bld: POSITIVE — AB

## 2022-11-04 LAB — PHOSPHORUS: Phosphorus: 3.7 mg/dL (ref 2.5–4.6)

## 2022-11-04 LAB — CULTURE, BLOOD (ROUTINE X 2)

## 2022-11-04 MED ORDER — ONDANSETRON HCL 4 MG/2ML IJ SOLN
4.0000 mg | Freq: Four times a day (QID) | INTRAMUSCULAR | Status: DC | PRN
  Administered 2022-11-04 – 2022-11-12 (×2): 4 mg via INTRAVENOUS
  Filled 2022-11-04 (×2): qty 2

## 2022-11-04 MED ORDER — PANTOPRAZOLE SODIUM 40 MG IV SOLR
40.0000 mg | Freq: Two times a day (BID) | INTRAVENOUS | Status: DC
  Administered 2022-11-08 – 2022-11-09 (×4): 40 mg via INTRAVENOUS
  Filled 2022-11-04 (×4): qty 10

## 2022-11-04 MED ORDER — PROCHLORPERAZINE EDISYLATE 10 MG/2ML IJ SOLN: 10.0000 mg | Freq: Four times a day (QID) | INTRAMUSCULAR | Status: DC | PRN

## 2022-11-04 MED ORDER — MAGNESIUM SULFATE 2 GM/50ML IV SOLN
2.0000 g | Freq: Once | INTRAVENOUS | Status: AC
  Administered 2022-11-04: 2 g via INTRAVENOUS
  Filled 2022-11-04: qty 50

## 2022-11-04 MED ORDER — PANTOPRAZOLE INFUSION (NEW) - SIMPLE MED
8.0000 mg/h | INTRAVENOUS | Status: AC
  Administered 2022-11-04 – 2022-11-07 (×6): 8 mg/h via INTRAVENOUS
  Filled 2022-11-04 (×8): qty 100

## 2022-11-04 MED ORDER — SODIUM CHLORIDE 0.9 % IV BOLUS
1000.0000 mL | Freq: Once | INTRAVENOUS | Status: AC
  Administered 2022-11-04: 1000 mL via INTRAVENOUS

## 2022-11-04 MED ORDER — PANTOPRAZOLE 80MG IVPB - SIMPLE MED
80.0000 mg | Freq: Once | INTRAVENOUS | Status: AC
  Administered 2022-11-04: 80 mg via INTRAVENOUS
  Filled 2022-11-04: qty 100

## 2022-11-04 MED ORDER — SODIUM CHLORIDE 0.9% IV SOLUTION: Freq: Once | INTRAVENOUS | Status: AC

## 2022-11-04 NOTE — Progress Notes (Signed)
PROGRESS NOTE    Kenneth Bailey  WUJ:[id number] DOB: [birth year] DOA: 10/30/2022 PCP: [name], MD   Brief Narrative:  The patient is a 57 year old chronically ill-appearing Caucasian male with a past medical history significant for but not limited to uncontrolled diabetes mellitus type 2, CAD status post CABG, chronic combined systolic and diastolic CHF, tobacco abuse as well as other comorbidities who presented to [facility] emergency department at the recommendation of his podiatrist given his persistent left foot pain and concern for osteomyelitis of the left fifth toe.  He has been suffering with a left foot diabetic ulcer for approximately 4 weeks and will prescribe a course of antibiotics through the hospital hospital ED and was referred to podiatry who did follow him for approximate 1 week.  Imaging was done and the podiatrist evaluated and there is finding concerns for osteomyelitis.  Patient was contacted and instructed to go to [name] ED.  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    He was taken to the OR and is status post left foot incision and drainage with resection of fifth metatarsal head on 6/11 with cultures currently growing MRSA.  ID has been consulted for further evaluation recommendations. Given proximal incision necrosis, Podiatry recommends Vascular Consult for evaluation for wound healing and Vascular consulted and recommending obtaining an angiogram of the left lower leg with possible intervention through transfemoral access given that they suspect that he has tibial disease.  Patient was taken to the Cath Lab 11/03/22 and he underwent a left posterior tibial artery and tibioperoneal trunk angioplasty and was placed on aspirin and Plavix but no statin given his  allergy.  Vascular surgery feels that he is optimized from their standpoint PT to reevaluate and treat.  Today he was nauseous and vomiting and then syncopized and had hematemesis as well as melanotic stools.  His aspirin Plavix was discontinued and he was placed on a PPI drip, given 1 L boluses and a stat CBC was done which showed that his blood count dropped so he is getting 2 units of PRBCs.  GI was consulted and planning for EGD in the a.m. and he has been made n.p.o..  Assessment and Plan:  Left Fifth Toe and Lateral Foot Cellulitis with Osteomyelitis -Continued empiric Antibiotics with IV Cefepime q grams q12h and IV Vancomycin; Infectious disease has now stopped the IV cefepime and recommending giving the patient Oritavancin or Dalbavancin tomorrow (11/04/22) as the long acting and then have patient do additional 14 day course of Doxycycline 100mg  po bid to start on next Saturday on 11/11/22.  -ABIs done and showed "Right: Resting right  ankle-brachial index indicates moderate right lower  extremity arterial disease. The right toe-brachial index is abnormal. 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  -Patient's leukocytosis has now worsened in the setting of GI bleeding -The patient is s/p left foot I&D with resection of fifth metatarsal head 6/11 in OR -Blood Cx x2 showing NGTD at 5 Days and now Blood Cx being repeated today per the  rapid nurse -Wound Cx obtained and showing: Gram Stain NO WBC SEEN RARE GRAM POSITIVE COCCI IN PAIRS RARE GRAM NEGATIVE RODS Performed at [facility], [address] [place], [place] [address]  Culture FEW METHICILLIN RESISTANT STAPHYLOCOCCUS AUREUS NO ANAEROBES ISOLATED; CULTURE IN PROGRESS FOR 5 DAYS  Report Status PENDING  Organism ID, Bacteria METHICILLIN RESISTANT STAPHYLOCOCCUS AUREUS  Resulting Agency [facility]     Susceptibility   Methicillin resistant staphylococcus aureus    MIC    CIPROFLOXACIN >=8 RESISTANT Resistant    CLINDAMYCIN >=8 RESISTANT Resistant    ERYTHROMYCIN >=8 RESISTANT Resistant    GENTAMICIN <=0.5 SENSI... Sensitive    Inducible Clindamycin NEGATIVE Sensitive    LINEZOLID 2 SENSITIVE Sensitive    OXACILLIN >=4 RESISTANT Resistant    RIFAMPIN <=0.5 SENSI... Sensitive    TETRACYCLINE <=1 SENSITIVE Sensitive    TRIMETH/SULFA 160 RESISTANT Resistant    VANCOMYCIN <=0.5 SENSI... Sensitive                -Continue with po Oxycodone 5 mg po q6hprn Moderate/Breakthrough Pain and IV Hydromorphone 0.5 mg IV q4hprn Severe Pain  -Consulted ID for further evaluation and recommendations given his Infection and Resection however podiatry feels that there is surgical cure of his bone infection; ID now recommends Oritavancin 1200 mg today and changing to Oral Doxycycline at Discharge starting 11/11/22 -Podiatry is recommending heel weightbearing in the postoperative shoe -PT OT consulted however patient's pain limited participation and will need to re-engage given his GI Bleeding -Patient now has some necrosis of the proximal incision the podiatrist has recommended obtaining a vascular consultation given his concern for wound healing and vascular Dr. Chestine Spore been consulted -Vascular did an Abdominal aortogram with left lower extremity runoff  and he had a left posterior tibial artery and tibioperoneal trunk angioplasty and was placed on Plavix -Vascular feels  that the patient has inline flow down the left lower extremity to the posterior tibial artery retrograde after his and feels that he is optimized from a vascular standpoint and recommending Aspirin Plavix however this is now been stopped given his GI bleeding   Acute Kidney Injury, improved -Likely prerenal in setting of infection with poor intake -Baseline creatinine approximately 1.3 with creatinine 2.9 at presentation  -BUN/Cr Trend: Recent Labs  Lab 10/30/22 1658 10/31/22 0450 11/01/22 0059 11/02/22 0332 11/03/22 0145 11/04/22 0123 11/04/22 1450  BUN 48* 49* 53* 37* 34* 39* 59*  CREATININE 2.91* 2.01* 1.47* 1.09 1.11 1.03 1.12  -IVF now stopped  -Avoid Nephrotoxic Medications, Contrast Dyes, Hypotension and Dehydration to Ensure Adequate Renal Perfusion and will need to Renally Adjust Meds -Continue to Monitor and Trend Renal Function carefully and repeat CMP in the AM   Chronic Combined Systolic and Diastolic CHF -Euvolemic/somewhat dehydrated at presentation -follow with gentle volume expansion  Intake/Output Summary (Last 24 hours) at 11/04/2022 1731 Last data filed at 11/03/2022 2200 Gross per 24 hour  Intake 250 ml  Output 850 ml  Net -600 ml  -Strict I's and O's and Daily Weights -Given a 1 Liter Bolus of NS at 500 mL/hr and will get 2 units of pRBCs -Continue to  Monitor for Signs and Symptoms of Volume Overload now that he is getting blood and Fluid  Syncopal Episode -Setting of GI bleeding and patient passed out after coming back from the bathroom and was lowered to the floor by nursing staff -He is going to be placed on a Protonix drip and getting 2 units of blood and received a 1 L bolus -Need to consider monitor for and will need PT OT to further evaluate and treat when she is more stable  Nausea and Vomiting -In the setting of Abx and now GI Bleeding -Start IV Zofran and IV Compazine   Tobacco Abuse -Has been counseled on the absolute need to discontinue  Tobacco completely -Continue with nicotine patch 14 mg transdermal every 24 hours  HLD -C/w Gemfibrozil 600 mg po BID  -Repeat panel done and showed a total cholesterol/HDL ratio 7.0, cholesterol level 140, HDL 21, LDL 76, triglycerides 253, VLDL 51   CAD status post CABG -Asymptomatic at present -Holding aspirin 81 mg p.o. daily as well as now clopidogrel 75 mg p.o. daily given his suspected GI bleeding -Not on a Statin due to Adverse Effects/Allergies    Uncontrolled DM2 -CBG controlled as an inpatient  -Recent A1c was 10.7 -C/w Diabetic education -Continue to Monitor CBGs per protocol; CBG Trend: Recent Labs  Lab 11/03/22 1145 11/03/22 1647 11/03/22 2100 11/04/22 0600 11/04/22 1146 11/04/22 1324 11/04/22 1705  GLUCAP 157* 208* 164* 208* 254* 252* 348*   Normocytic Anemia and now ABLA in the setting of Suspected GI Bleed with Hematemesis and Melena -Hgb/Hct Trend: Recent Labs  Lab 10/30/22 1658 10/31/22 0450 11/01/22 0059 11/02/22 0332 11/03/22 0145 11/04/22 0123 11/04/22 1450  HGB 13.8 12.0* 12.0* 11.4* 11.5* 9.7* 7.0*  HCT 43.0 36.9* 36.9* 34.8* 35.3* 29.3* 21.0*  MCV 83.7 85.0 83.9 84.5 84.9 81.6 84.7  -Checked Anemia Panel and showed an iron level of 17, UIBC of 299, TIBC 360, saturation ratios 5%, ferritin 193, folate 9.5, vitamin B12 165 -Initiated on iron supplementation but this will not be stopped given his dark emesis and melanotic stools -Start IV PPI and type and screen and transfuse 2 units of PRBCs given that hemoglobin has now dropped to 7 -Obtain gastric occult testing and FOBT testing; FOBT + -Given 1 L bolus of normal saline -Continue to Monitor for S/Sx of Bleeding; No overt bleeding noted -Repeat CBC in the AM  Hypoalbuminemia -Patient's Albumin Trend: Recent Labs  Lab 11/02/22 0332 11/03/22 0145 11/04/22 0123 11/04/22 1450  ALBUMIN 2.9* 3.1* 2.4* 2.4*  -Continue to Monitor and Trend and repeat CMP in the AM   DVT prophylaxis: SCDs  Start: 10/30/22 2211    Code Status: Full Code Family Communication: No family present at bedside  Disposition Plan:  Level of care: Progressive Cardiac Status is: Inpatient Remains inpatient appropriate because: Having hematemesis and melena so we will place on a Protonix drip and consult GI.   Consultants:  Podiatry  Infectious diseases Vascular surgery Gastroenterology  Procedures:  As delineated as above  POD #3 s/p fifth metatarsal head resection and irrigation and debridement   Procedure Performed: 1.  Ultrasound-guided access right common femoral artery 2.  Aortogram with catheter selection of aorta 3.  Left lower extremity arteriogram with selection of third order branches including the left below-knee popliteal artery 4.  Ultrasound-guided access left posterior tibial artery at the ankle retrograde with pedal sheath placement 5.  Left posterior tibial artery and tibioperoneal trunk angioplasty (3 mm x 220 mm Sterling) 6.  Mynx closure of the right common femoral artery 7.  38 minutes of monitored moderate conscious sedation time  Antimicrobials:  Anti-infectives (From admission, onward)    Start     Dose/Rate Route Frequency Ordered Stop   11/11/22 1000  doxycycline (VIBRA-TABS) tablet 100 mg        100 mg Oral Every 12 hours 11/03/22 1616 11/25/22 0959   11/04/22 0800  Oritavancin Diphosphate (ORBACTIV) 1,200 mg in dextrose 5 % IVPB        1,200 mg 333.3 mL/hr over 180 Minutes Intravenous Once 11/03/22 1611 11/04/22 1322   11/03/22 0500  vancomycin (VANCOREADY) IVPB 1500 mg/300 mL  Status:  Discontinued        1,500 mg 150 mL/hr over 120 Minutes Intravenous Every 24 hours 11/02/22 1439 11/03/22 1611   11/02/22 1445  ceFEPIme (MAXIPIME) 2 g in sodium chloride 0.9 % 100 mL IVPB  Status:  Discontinued        2 g 200 mL/hr over 30 Minutes Intravenous Every 8 hours 11/02/22 1437 11/03/22 1430   11/02/22 0500  vancomycin (VANCOREADY) IVPB 1250 mg/250 mL  Status:   Discontinued        1,250 mg 166.7 mL/hr over 90 Minutes Intravenous Every 24 hours 11/01/22 0721 11/02/22 1439   11/01/22 0430  vancomycin (VANCOCIN) IVPB 1000 mg/200 mL premix        1,000 mg 200 mL/hr over 60 Minutes Intravenous  Once 11/01/22 0339 11/01/22 0620   11/01/22 0430  ceFEPIme (MAXIPIME) 2 g in sodium chloride 0.9 % 100 mL IVPB  Status:  Discontinued        2 g 200 mL/hr over 30 Minutes Intravenous Every 12 hours 11/01/22 0342 11/02/22 1437   11/01/22 0400  ceFEPIme (MAXIPIME) 2 g in sodium chloride 0.9 % 100 mL IVPB  Status:  Discontinued        2 g 200 mL/hr over 30 Minutes Intravenous Every 24 hours 10/31/22 0342 11/01/22 0341   10/31/22 0430  ceFEPIme (MAXIPIME) 2 g in sodium chloride 0.9 % 100 mL IVPB        2 g 200 mL/hr over 30 Minutes Intravenous  Once 10/31/22 0342 10/31/22 0434   10/30/22 2138  vancomycin variable dose per unstable renal function (pharmacist dosing)  Status:  Discontinued         Does not apply See admin instructions 10/30/22 2138 11/01/22 0721   10/30/22 1745  vancomycin (VANCOREADY) IVPB 1500 mg/300 mL        1,500 mg 150 mL/hr over 120 Minutes Intravenous  Once 10/30/22 1743 10/30/22 2058       Subjective: Seen And examined at bedside and he was appearing ill this morning and had just vomited.  Subsequently after left the room he vomited more and started vomiting hematemesis black vomitus as well as black stools.  Had a syncopal episode and rapid was called.  All his antiplatelets and DVT prophylaxis have been discontinued and he is placed on a Protonix drip.  GI is planning on scoping him tomorrow.  Patient was upset that the facility has lost his sneakers and his boots as long with his dentures.  No other concerns or complaints at this time.  Objective: Vitals:   11/04/22 1400 11/04/22 1500 11/04/22 1600 11/04/22 1700  BP: 105/71 104/64 115/63 91/62  Pulse: (!) 113 (!) 101 90 92  Resp:      Temp:      TempSrc:      SpO2: 96% 100% 99%  100%  Weight:      Height:        Intake/Output Summary (Last 24 hours) at 11/04/2022 1754 Last data filed at 11/03/2022 2200 Gross per 24 hour  Intake 250 ml  Output 850 ml  Net -600 ml   Filed Weights   11/01/22 1151 11/02/22 0500 11/04/22 0345  Weight: 70.9 kg 71.3 kg 72.6 kg   Examination: Physical Exam:  Constitutional: WN/WD thin Caucasian male who appears ill today and had just vomited  Respiratory: Diminished to auscultation bilaterally, no wheezing, rales, rhonchi or crackles. Normal respiratory effort and patient is not tachypenic. No accessory muscle use.  Unlabored breathing Cardiovascular: Tachycardic rate but regular rhythm, no murmurs / rubs / gallops. S1 and S2 auscultated. No extremity edema but foot is wrapped Abdomen: Soft, mildly-tender, non-distended. Bowel sounds positive.  GU: Deferred. Musculoskeletal: No clubbing / cyanosis of digits/nails.  Left foot is wrapped Skin: No rashes, lesions, ulcers limited skin evaluation. No induration; Warm and dry.  Neurologic: CN 2-12 grossly intact with no focal deficits. Romberg sign and cerebellar reflexes not assessed.  Psychiatric: Normal judgment and insight. Alert and oriented x 3.  Appears a little anxious  Data Reviewed: I have personally reviewed following labs and imaging studies  CBC: Recent Labs  Lab 11/01/22 0059 11/02/22 0332 11/03/22 0145 11/04/22 0123 11/04/22 1450  WBC 16.5* 13.2* 14.0* 12.9* 17.5*  NEUTROABS  --  10.5* 11.8* 10.6* 14.9*  HGB 12.0* 11.4* 11.5* 9.7* 7.0*  HCT 36.9* 34.8* 35.3* 29.3* 21.0*  MCV 83.9 84.5 84.9 81.6 84.7  PLT 353 306 326 305 374   Basic Metabolic Panel: Recent Labs  Lab 10/31/22 0450 11/01/22 0059 11/02/22 0332 11/03/22 0145 11/04/22 0123 11/04/22 1450  NA 137 135 133* 133* 133* 132*  K 3.8 4.6 4.8 4.8 4.1 4.5  CL 100 97* 97* 97* 97* 97*  CO2 22 22 24  21* 24 23  GLUCOSE 178* 152* 153* 176* 211* 325*  BUN 49* 53* 37* 34* 39* 59*  CREATININE 2.01* 1.47*  1.09 1.11 1.03 1.12  CALCIUM 9.4 9.6 9.6 9.9 9.1 9.2  MG 2.0 2.2 1.8 1.7 1.5*  --   PHOS 5.0*  --  3.0 4.0 3.7  --    GFR: Estimated Creatinine Clearance: 72.1 mL/min (by C-G formula based on SCr of 1.12 mg/dL). Liver Function Tests: Recent Labs  Lab 11/02/22 0332 11/03/22 0145 11/04/22 0123 11/04/22 1450  AST 13* 13* 15 15  ALT 10 12 15 15   ALKPHOS 63 76 66 63  BILITOT 0.5 0.7 0.7 0.4  PROT 6.9 7.5 6.6 6.4*  ALBUMIN 2.9* 3.1* 2.4* 2.4*   No results for input(s): "LIPASE", "AMYLASE" in the last 168 hours. No results for input(s): "AMMONIA" in the last 168 hours. Coagulation Profile: Recent Labs  Lab 11/04/22 1450  INR 1.2   Cardiac Enzymes: No results for input(s): "CKTOTAL", "CKMB", "CKMBINDEX", "TROPONINI" in the last 168 hours. BNP (last 3 results) No results for input(s): "PROBNP" in the last 8760 hours. HbA1C: No results for input(s): "HGBA1C" in the last 72 hours. CBG: Recent Labs  Lab 11/03/22 2100 11/04/22 0600 11/04/22 1146 11/04/22 1324 11/04/22 1705  GLUCAP 164* 208* 254* 252* 348*   Lipid Profile: Recent Labs    11/04/22 0123  CHOL 148  HDL 21*  LDLCALC 76  TRIG 161*  CHOLHDL 7.0   Thyroid Function Tests: No results for input(s): "TSH", "T4TOTAL", "FREET4", "T3FREE", "THYROIDAB" in the last 72 hours. Anemia Panel: Recent Labs  11/03/22 0145  VITAMINB12 355  FOLATE 9.5  FERRITIN 193  TIBC 316  IRON 17*  RETICCTPCT 1.1   Sepsis Labs: Recent Labs  Lab 10/30/22 1658 10/30/22 2005 11/04/22 1450  LATICACIDVEN 2.4* 1.2 1.8    Recent Results (from the past 240 hour(s))  Blood culture (routine x 2)     Status: None   Collection Time: 10/30/22  4:58 PM   Specimen: BLOOD  Result Value Ref Range Status   Specimen Description BLOOD LEFT FOREARM  Final   Special Requests   Final    BOTTLES DRAWN AEROBIC AND ANAEROBIC Blood Culture adequate volume   Culture   Final    NO GROWTH 5 DAYS Performed at [facility], [address], [place], [place] [address]    Report Status 11/04/2022 FINAL  Final  Blood culture (routine x 2)     Status: None   Collection Time: 10/30/22  5:11 PM   Specimen: BLOOD  Result Value Ref Range Status   Specimen Description BLOOD RIGHT ANTECUBITAL  Final   Special Requests   Final    BOTTLES DRAWN AEROBIC AND ANAEROBIC Blood Culture results may not be optimal due to an excessive volume of blood received in culture bottles   Culture   Final    NO GROWTH 5 DAYS Performed at [facility], [address], [place], [place] [address]    Report Status 11/04/2022 FINAL  Final  MRSA Next Gen by PCR, Nasal     Status: None   Collection Time: 10/31/22  3:44 AM   Specimen: Nasal Mucosa; Nasal Swab  Result Value Ref Range Status   MRSA by PCR Next Gen NOT DETECTED NOT DETECTED Final    Comment: (NOTE) The GeneXpert MRSA Assay (FDA approved for NASAL specimens only), is one component of a comprehensive MRSA colonization surveillance program. It is not intended to diagnose MRSA infection nor to guide or monitor treatment for MRSA infections. Test performance is not FDA approved in patients less than 63 years old. Performed at [facility], [address] [place], [place] [address]   Aerobic/Anaerobic Culture w Gram Stain (surgical/deep wound)     Status: None (Preliminary result)   Collection Time: 10/31/22  1:24 PM   Specimen: Toe, Left; Amputation  Result Value Ref Range Status   Specimen Description WOUND LEFT TOE  Final   Special Requests 5TH METATARSAL HEAD PT ON VANC CEFEPIME  Final   Gram Stain   Final    NO WBC SEEN RARE GRAM POSITIVE COCCI IN PAIRS RARE GRAM NEGATIVE RODS Performed at [facility], [address] [place], [place] [address]    Culture   Final    FEW METHICILLIN RESISTANT STAPHYLOCOCCUS AUREUS NO ANAEROBES ISOLATED; CULTURE IN PROGRESS FOR 5 DAYS    Report Status PENDING  Incomplete   Organism ID, Bacteria METHICILLIN RESISTANT  STAPHYLOCOCCUS AUREUS  Final      Susceptibility   Methicillin resistant staphylococcus aureus - MIC*    CIPROFLOXACIN >=8 RESISTANT Resistant     ERYTHROMYCIN >=8 RESISTANT Resistant     GENTAMICIN <=0.5 SENSITIVE Sensitive     OXACILLIN >=4 RESISTANT Resistant     TETRACYCLINE <=1 SENSITIVE Sensitive     VANCOMYCIN <=0.5 SENSITIVE Sensitive     TRIMETH/SULFA 160 RESISTANT Resistant     CLINDAMYCIN >=8 RESISTANT Resistant     RIFAMPIN <=0.5 SENSITIVE Sensitive     Inducible Clindamycin NEGATIVE Sensitive  LINEZOLID 2 SENSITIVE Sensitive     * FEW METHICILLIN RESISTANT STAPHYLOCOCCUS AUREUS  Surgical pcr screen     Status: None   Collection Time: 11/03/22  7:07 AM   Specimen: Nasal Mucosa; Nasal Swab  Result Value Ref Range Status   MRSA, PCR NEGATIVE NEGATIVE Final   Staphylococcus aureus NEGATIVE NEGATIVE Final    Comment: (NOTE) The Xpert SA Assay (FDA approved for NASAL specimens in patients 66 years of age and older), is one component of a comprehensive surveillance program. It is not intended to diagnose infection nor to guide or monitor treatment. Performed at [facility], [address], [place], [place] [address]      Radiology Studies: DG Chest Port 1 View  Result Date: 11/04/2022 CLINICAL DATA:  Sepsis EXAM: PORTABLE CHEST 1 VIEW COMPARISON:  04/05/2022 FINDINGS: The heart size and mediastinal contours are within normal limits. Prior sternotomy and CABG. Both lungs are clear. The visualized skeletal structures are unremarkable. IMPRESSION: No active disease. Electronically Signed   By: Duanne Guess D.O.   On: 11/04/2022 14:57   PERIPHERAL VASCULAR CATHETERIZATION  Result Date: 11/03/2022 Images from the original result were not included.   Patient name: Kenneth Bailey  MRN: [id number]        DOB: 1966-04-19            Sex: male  11/03/2022 Pre-operative Diagnosis: Critical limb ischemia of the left lower extremity with tissue loss Post-operative  diagnosis:  Same Surgeon:  Cephus Shelling, MD Procedure Performed: 1.  Ultrasound-guided access right common femoral artery 2.  Aortogram with catheter selection of aorta 3.  Left lower extremity arteriogram with selection of third order branches including the left below-knee popliteal artery 4.  Ultrasound-guided access left posterior tibial artery at the ankle retrograde with pedal sheath placement 5.  Left posterior tibial artery and tibioperoneal trunk angioplasty (3 mm x 220 mm Sterling) 6.  Mynx closure of the right common femoral artery 7.  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  He presents today for an angiogram with a focus on the left leg with possible intervention after risk benefits discussed.  Findings:  Ultrasound-guided access right common femoral artery.  Aortogram showed both renals were patent as well as his infrarenal aorta and both iliac arteries were patent.  On the left he had a patent common femoral and profunda.  The SFA has some diffuse disease without flow-limiting stenosis and is patent.  The above and below-knee popliteal arteries patent.  Tibial trifurcation is occluded.  Dominant runoff was through a diseased peroneal that reconstituted distally and was occluded proximally with a collateral filling the posterior tibial at the ankle.  Initially tried to go antegrade and I could not get through his chronic total occlusion in the tibial trifurcation focusing on the peroneal.  I then elected to stick posterior tibial retrograde at the ankle.  Then I was able to get through his chronic total occlusion in the posterior tibial retrograde and snare my wire for through and through access.  The entire tibioperoneal trunk and posterior tibial artery was then angioplastied with a 3 mm x 220 mm Sterling.  I did go down and angioplastied the PT at the  ankle with a 2 mm Sterling antegrade at completion.  Widely patent vessel at completion.  He now has inline flow through the posterior tibial artery.  Procedure:  The patient was identified in the holding area and taken to room 8.  The patient was then placed supine on the table and prepped and draped in the usual sterile fashion.  A time out was called.  The patient received Versed and fentanyl for conscious moderate sedation.  Vital signs were monitored including heart rate, respiratory rate, oxygenation and blood pressure.  I was present for all of moderate sedation.  Ultrasound was used to evaluate the right common femoral artery.  It was patent .  A digital ultrasound image was acquired.  A micropuncture needle was used to access the right common femoral artery under ultrasound guidance.  An 018 wire was advanced without resistance and a micropuncture sheath was placed.  The 018 wire was removed and a benson wire was placed.  The micropuncture sheath was exchanged for a 5 french sheath.  An omniflush catheter was advanced over the wire to the level of L-1.  An abdominal angiogram was obtained.  Next, using the omniflush catheter and a benson wire, the aortic bifurcation was crossed and the catheter was placed into theleft external iliac artery and left runoff was obtained.  Pertinent findings are noted above.  We elected to try antegrade intervention on his tibial disease.  I used a Glidewire advantage down the left SFA and exchanged for a long 5 French sheath in the right groin with a catapault sheath over the aortic bifurcation into the left SFA.  Patient was given 100 units/kg IV heparin.  I then went down with a V18 wire and a quick cross catheter and got into the below-knee popliteal artery.  I did hand injections here to identify the reconstitution of the peroneal distally and triued to get down the tibioperoneal trunk.  I could not get antegrade.  Eventually I did not think an antegrade  attempt was going to be successful.  I then evaluated the posterior tibial at the ankle where it reconstituted and this was accessed under ultrasound guidance with a micro access needle placed a microsheath and then used to CXI catheter with a V18 to come retrograde and got through the long segment posterior tibial occlusion.  I did snare my wire from the sheath in the right groin over the aortic bifurcation as through and through access.  The entire TP trunk and posterior tibial artery was then angioplastied with a long 3 mm x 220 mm Sterling for 2 minutes from an antegrade approach.  Final imaging showed excellent flow down to the ankle where there appeared to be residual stenosis.  I then pulled the pedal sheath and got my wire down into the plantar arch and then I angioplastied across the posterior tibial at the ankle with a 2 mm Sterling.  We gave nitro.  We then had widely patent vessel with inline flow down the foot.  Wires and catheters removed.  Mynx closure in the right groin.  Plan: Patient now has inline flow down the left lower extremity through the posterior tibial after retrograde access.  Optimized from vascular surgery standpoint.  Aspirin Plavix.  Statin allergy.    Cephus Shelling, MD [facility] [place] Office: [phone]     Scheduled Meds:  sodium chloride   Intravenous Once   [START ON 11/11/2022] doxycycline  100 mg Oral Q12H   gemfibrozil  600 mg Oral BID AC   insulin aspart  0-5 Units Subcutaneous QHS   insulin aspart  0-9 Units Subcutaneous TID WC  melatonin  10 mg Oral QHS   nicotine  14 mg Transdermal Daily   [START ON 11/08/2022] pantoprazole  40 mg Intravenous Q12H   sodium chloride flush  3 mL Intravenous Q12H   Continuous Infusions:  sodium chloride 100 mL/hr at 11/04/22 0217   sodium chloride     pantoprazole 8 mg/hr (11/04/22 1650)    LOS: 5 days   Marguerita Merles, DO [facility] Available via Epic secure chat 7am-7pm After  these hours, please refer to coverage provider listed on [email or web address] 11/04/2022, 5:54 PM

## 2022-11-04 NOTE — Progress Notes (Signed)
PT Cancellation Note  Patient Details Name: Kenneth Bailey MRN: [id number] DOB: 09-Oct-1965   Cancelled Treatment:    Reason Eval/Treat Not Completed: Other (comment). Pt's RN and NT present in room upon PT arrival, reporting pt with recent episode of emesis. Pt's RN giving nausea meds via IV. PT will continue to follow-up with pt acutely as available and appropriate.    Alessandra Bevels Tyson Masin 11/04/2022, 11:50 AM

## 2022-11-04 NOTE — Progress Notes (Addendum)
  Progress Note    11/04/2022 8:33 AM 1 Day Post-Op  Subjective:  no complaints   Vitals:   11/03/22 2322 11/04/22 0345  BP: (!) 141/103 127/76  Pulse: 89 94  Resp: 20 20  Temp: 98.4 F (36.9 C) 98.3 F (36.8 C)  SpO2: 99% 98%   Physical Exam: Lungs:  non labored Incisions:  R groin cath site without hematoma; PT cath site without bleeding or hematoma Extremities:  1+ palpable PT distal to cath site; multiphasic signal by doppler Abdomen:  soft Neurologic: A&O  CBC    Component Value Date/Time   WBC 12.9 (H) 11/04/2022 0123   RBC 3.59 (L) 11/04/2022 0123   HGB 9.7 (L) 11/04/2022 0123   HCT 29.3 (L) 11/04/2022 0123   PLT 305 11/04/2022 0123   MCV 81.6 11/04/2022 0123   MCH 27.0 11/04/2022 0123   MCHC 33.1 11/04/2022 0123   RDW 13.7 11/04/2022 0123   LYMPHSABS 1.0 11/04/2022 0123   MONOABS 0.9 11/04/2022 0123   EOSABS 0.1 11/04/2022 0123   BASOSABS 0.1 11/04/2022 0123    BMET    Component Value Date/Time   NA 133 (L) 11/04/2022 0123   K 4.1 11/04/2022 0123   CL 97 (L) 11/04/2022 0123   CO2 24 11/04/2022 0123   GLUCOSE 211 (H) 11/04/2022 0123   BUN 39 (H) 11/04/2022 0123   CREATININE 1.03 11/04/2022 0123   CALCIUM 9.1 11/04/2022 0123   GFRNONAA >60 11/04/2022 0123    INR    Component Value Date/Time   INR 1.3 (H) 06/10/2021 1503     Intake/Output Summary (Last 24 hours) at 11/04/2022 1610 Last data filed at 11/03/2022 2200 Gross per 24 hour  Intake 1128.37 ml  Output 850 ml  Net 278.37 ml     Assessment/Plan:  57 y.o. male is s/p retrograde pedal access with angioplasty of TP trunk and PTA 1 Day Post-Op   After intervention, patient has inline flow to L foot via PTA.  Access sites are without hematoma.  Faintly palpable PTA distal to access site.  He is optimized from a vascular standpoint.  Continue aspirin and plavix.  Wound care per Podiatry.  Office will arrange LLE arterial duplex and ABI in 4-6 weeks.   Emilie Rutter, PA-C [facility] [phone] 11/04/2022 8:33 AM  VASCULAR STAFF ADDENDUM: I have independently interviewed and examined the patient. I agree with the above.  Please call with any questions.  We will see him in the office in the near future with repeat noninvasive testing.  Rande Brunt. Lenell Antu, MD [facility] Phone Number: [phone] 11/04/2022 9:47 AM

## 2022-11-04 NOTE — Consult Note (Signed)
Reason for Consult: Coffee-ground emesis, anemia, and melena Referring Physician: [facility]  [facility] HPI: This is a 57 year old male with a PMH of DM, hyperlipidemia, HTN, and COPD admitted for treatment is critical limb ischemia.  He underwent a left posterior tibial artery and tibioperoneal trunk angioplasty on 11/03/2022.  ASA and Plavix were initiated and today he started to have coffee-ground emesis and melena.  His HGB dropped from 11.5 g/dL (05/24/7251) to 9.7 g/dL.  He had a colonoscopy on 12/21/2020, but he does not recall the results.  He recalls having the procedure in [place].  There is a history of PUD many years ago per his report and he does have some degree of GERD.  Past Medical History:  Diagnosis Date   COPD (chronic obstructive pulmonary disease) (HCC)    Diabetes mellitus, type 2 (HCC)    HLD (hyperlipidemia)    Hypertension    Tobacco abuse     Past Surgical History:  Procedure Laterality Date   AMPUTATION Left 10/31/2022   Procedure: AMPUTATION LEFT FIFTH TOE;  Surgeon: Louann Sjogren, DPM;  Location: [facility] OR;  Service: Podiatry;  Laterality: Left;   AMPUTATION TOE Left 10/31/2022   Procedure: IRRIGATION AND DEBRIDEMENTOF LEFT FOOT;  Surgeon: Louann Sjogren, DPM;  Location: [facility] OR;  Service: Podiatry;  Laterality: Left;   CORONARY ARTERY BYPASS GRAFT N/A 06/10/2021   Procedure: CORONARY ARTERY BYPASS GRAFTING (CABG) TIMES FOUR, USING LEFT INTERNAL MAMMARY ARTERY AND RIGHT GREATER SAPHENOUS VEIN HARVESTED ENDOSCOPICALLY;  Surgeon: Lovett Sox, MD;  Location: [facility] OR;  Service: Open Heart Surgery;  Laterality: N/A;   ENDOVEIN HARVEST OF GREATER SAPHENOUS VEIN Right 06/10/2021   Procedure: ENDOVEIN HARVEST OF GREATER SAPHENOUS VEIN;  Surgeon: Lovett Sox, MD;  Location: [facility] OR;  Service: Open Heart Surgery;  Laterality: Right;   RIGHT/LEFT HEART CATH AND CORONARY ANGIOGRAPHY N/A 06/06/2021   Procedure: RIGHT/LEFT HEART CATH AND CORONARY ANGIOGRAPHY;   Surgeon: Dolores Patty, MD;  Location: [facility] INVASIVE CV LAB;  Service: Cardiovascular;  Laterality: N/A;   TEE WITHOUT CARDIOVERSION N/A 06/10/2021   Procedure: TRANSESOPHAGEAL ECHOCARDIOGRAM (TEE);  Surgeon: Lovett Sox, MD;  Location: [facility] OR;  Service: Open Heart Surgery;  Laterality: N/A;    Family History  Problem Relation Age of Onset   Hypertension Mother    Cancer Mother    Heart disease Father     Social History:  reports that he has been smoking cigarettes. He uses smokeless tobacco. He reports current alcohol use. He reports that he does not use drugs.  Allergies:  Allergies  Allergen Reactions   Lisinopril     Leg swelling   Statins     Leg swelling    Medications: Scheduled:  [START ON 11/11/2022] doxycycline  100 mg Oral Q12H   gemfibrozil  600 mg Oral BID AC   insulin aspart  0-5 Units Subcutaneous QHS   insulin aspart  0-9 Units Subcutaneous TID WC   melatonin  10 mg Oral QHS   nicotine  14 mg Transdermal Daily   [START ON 11/08/2022] pantoprazole  40 mg Intravenous Q12H   sodium chloride flush  3 mL Intravenous Q12H   Continuous:  sodium chloride 100 mL/hr at 11/04/22 0217   sodium chloride     pantoprazole     pantoprazole      Results for orders placed or performed during the hospital encounter of 10/30/22 (from the past 24 hour(s))  Glucose, capillary     Status: Abnormal   Collection  Time: 11/03/22  4:47 PM  Result Value Ref Range   Glucose-Capillary 208 (H) 70 - 99 mg/dL  Glucose, capillary     Status: Abnormal   Collection Time: 11/03/22  9:00 PM  Result Value Ref Range   Glucose-Capillary 164 (H) 70 - 99 mg/dL  CBC with Differential/Platelet     Status: Abnormal   Collection Time: 11/04/22  1:23 AM  Result Value Ref Range   WBC 12.9 (H) 4.0 - 10.5 K/uL   RBC 3.59 (L) 4.22 - 5.81 MIL/uL   Hemoglobin 9.7 (L) 13.0 - 17.0 g/dL   HCT 82.9 (L) 56.2 - 13.0 %   MCV 81.6 80.0 - 100.0 fL   MCH 27.0 26.0 - 34.0 pg   MCHC 33.1 30.0 - 36.0 g/dL    RDW 86.5 78.4 - 69.6 %   Platelets 305 150 - 400 K/uL   nRBC 0.0 0.0 - 0.2 %   Neutrophils Relative % 82 %   Neutro Abs 10.6 (H) 1.7 - 7.7 K/uL   Lymphocytes Relative 8 %   Lymphs Abs 1.0 0.7 - 4.0 K/uL   Monocytes Relative 7 %   Monocytes Absolute 0.9 0.1 - 1.0 K/uL   Eosinophils Relative 1 %   Eosinophils Absolute 0.1 0.0 - 0.5 K/uL   Basophils Relative 1 %   Basophils Absolute 0.1 0.0 - 0.1 K/uL   Immature Granulocytes 1 %   Abs Immature Granulocytes 0.18 (H) 0.00 - 0.07 K/uL  Comprehensive metabolic panel     Status: Abnormal   Collection Time: 11/04/22  1:23 AM  Result Value Ref Range   Sodium 133 (L) 135 - 145 mmol/L   Potassium 4.1 3.5 - 5.1 mmol/L   Chloride 97 (L) 98 - 111 mmol/L   CO2 24 22 - 32 mmol/L   Glucose, Bld 211 (H) 70 - 99 mg/dL   BUN 39 (H) 6 - 20 mg/dL   Creatinine, Ser 2.95 0.61 - 1.24 mg/dL   Calcium 9.1 8.9 - 28.4 mg/dL   Total Protein 6.6 6.5 - 8.1 g/dL   Albumin 2.4 (L) 3.5 - 5.0 g/dL   AST 15 15 - 41 U/L   ALT 15 0 - 44 U/L   Alkaline Phosphatase 66 38 - 126 U/L   Total Bilirubin 0.7 0.3 - 1.2 mg/dL   GFR, Estimated >13 >24 mL/min   Anion gap 12 5 - 15  Magnesium     Status: Abnormal   Collection Time: 11/04/22  1:23 AM  Result Value Ref Range   Magnesium 1.5 (L) 1.7 - 2.4 mg/dL  Phosphorus     Status: None   Collection Time: 11/04/22  1:23 AM  Result Value Ref Range   Phosphorus 3.7 2.5 - 4.6 mg/dL  Lipid panel     Status: Abnormal   Collection Time: 11/04/22  1:23 AM  Result Value Ref Range   Cholesterol 148 0 - 200 mg/dL   Triglycerides 401 (H) <150 mg/dL   HDL 21 (L) >02 mg/dL   Total CHOL/HDL Ratio 7.0 RATIO   VLDL 51 (H) 0 - 40 mg/dL   LDL Cholesterol 76 0 - 99 mg/dL  Glucose, capillary     Status: Abnormal   Collection Time: 11/04/22  6:00 AM  Result Value Ref Range   Glucose-Capillary 208 (H) 70 - 99 mg/dL  Glucose, capillary     Status: Abnormal   Collection Time: 11/04/22 11:46 AM  Result Value Ref Range    Glucose-Capillary 254 (H) 70 -  99 mg/dL  Glucose, capillary     Status: Abnormal   Collection Time: 11/04/22  1:24 PM  Result Value Ref Range   Glucose-Capillary 252 (H) 70 - 99 mg/dL     PERIPHERAL VASCULAR CATHETERIZATION  Result Date: 11/03/2022 Images from the original result were not included.   Patient name: Kenneth Bailey  MRN: [id number]        DOB: September 17, 1965            Sex: male  11/03/2022 Pre-operative Diagnosis: Critical limb ischemia of the left lower extremity with tissue loss Post-operative diagnosis:  Same Surgeon:  Cephus Shelling, MD Procedure Performed: 1.  Ultrasound-guided access right common femoral artery 2.  Aortogram with catheter selection of aorta 3.  Left lower extremity arteriogram with selection of third order branches including the left below-knee popliteal artery 4.  Ultrasound-guided access left posterior tibial artery at the ankle retrograde with pedal sheath placement 5.  Left posterior tibial artery and tibioperoneal trunk angioplasty (3 mm x 220 mm Sterling) 6.  Mynx closure of the right common femoral artery 7.  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  He presents today for an angiogram with a focus on the left leg with possible intervention after risk benefits discussed.  Findings:  Ultrasound-guided access right common femoral artery.  Aortogram showed both renals were patent as well as his infrarenal aorta and both iliac arteries were patent.  On the left he had a patent common femoral and profunda.  The SFA has some diffuse disease without flow-limiting stenosis and is patent.  The above and below-knee popliteal arteries patent.  Tibial trifurcation is occluded.  Dominant runoff was through a diseased peroneal that reconstituted distally and was occluded proximally with a collateral filling the posterior tibial at the  ankle.  Initially tried to go antegrade and I could not get through his chronic total occlusion in the tibial trifurcation focusing on the peroneal.  I then elected to stick posterior tibial retrograde at the ankle.  Then I was able to get through his chronic total occlusion in the posterior tibial retrograde and snare my wire for through and through access.  The entire tibioperoneal trunk and posterior tibial artery was then angioplastied with a 3 mm x 220 mm Sterling.  I did go down and angioplastied the PT at the ankle with a 2 mm Sterling antegrade at completion.  Widely patent vessel at completion.  He now has inline flow through the posterior tibial artery.             Procedure:  The patient was identified in the holding area and taken to room 8.  The patient was then placed supine on the table and prepped and draped in the usual sterile fashion.  A time out was called.  The patient received Versed and fentanyl for conscious moderate sedation.  Vital signs were monitored including heart rate, respiratory rate, oxygenation and blood pressure.  I was present for all of moderate sedation.  Ultrasound was used to evaluate the right common femoral artery.  It was patent .  A digital ultrasound image was acquired.  A micropuncture needle was used to access the right common femoral artery under ultrasound guidance.  An 018 wire was advanced without resistance and a micropuncture sheath was placed.  The 018 wire was removed and a benson wire was placed.  The micropuncture sheath was exchanged for a 5 french sheath.  An omniflush catheter was advanced over the wire to the level of L-1.  An abdominal angiogram was obtained.  Next, using the omniflush catheter and a benson wire, the aortic bifurcation was crossed and the catheter was placed into theleft external iliac artery and left runoff was obtained.  Pertinent findings are noted above.  We elected to try antegrade intervention on his tibial disease.  I used a  Glidewire advantage down the left SFA and exchanged for a long 5 French sheath in the right groin with a catapault sheath over the aortic bifurcation into the left SFA.  Patient was given 100 units/kg IV heparin.  I then went down with a V18 wire and a quick cross catheter and got into the below-knee popliteal artery.  I did hand injections here to identify the reconstitution of the peroneal distally and triued to get down the tibioperoneal trunk.  I could not get antegrade.  Eventually I did not think an antegrade attempt was going to be successful.  I then evaluated the posterior tibial at the ankle where it reconstituted and this was accessed under ultrasound guidance with a micro access needle placed a microsheath and then used to CXI catheter with a V18 to come retrograde and got through the long segment posterior tibial occlusion.  I did snare my wire from the sheath in the right groin over the aortic bifurcation as through and through access.  The entire TP trunk and posterior tibial artery was then angioplastied with a long 3 mm x 220 mm Sterling for 2 minutes from an antegrade approach.  Final imaging showed excellent flow down to the ankle where there appeared to be residual stenosis.  I then pulled the pedal sheath and got my wire down into the plantar arch and then I angioplastied across the posterior tibial at the ankle with a 2 mm Sterling.  We gave nitro.  We then had widely patent vessel with inline flow down the foot.  Wires and catheters removed.  Mynx closure in the right groin.  Plan: Patient now has inline flow down the left lower extremity through the posterior tibial after retrograde access.  Optimized from vascular surgery standpoint.  Aspirin Plavix.  Statin allergy.    Cephus Shelling, MD [facility] [place] Office: [phone]    ROS:  As stated above in the HPI otherwise negative.  Blood pressure 105/71, pulse (!) 113, temperature 100.1 F (37.8 C),  temperature source Rectal, resp. rate 20, height 5\' 8"  (1.727 m), weight 72.6 kg, SpO2 96 %.    PE: Gen: NAD, Alert and Oriented HEENT:  [place]/AT, EOMI Neck: Supple, no LAD Lungs: CTA Bilaterally CV: RRR without M/G/R ABD: Soft, NTND, +BS Ext: No C/C/E  Assessment/Plan: 1) Coffee-ground emesis. 2) Melena. 3) Anemia. 4) Critical limb ischemia s/p angioplasty.   The patient is hemodynamically stable.  Further evaluation with an EGD is warranted.  Unfortunately he ate today.  Plan: 1) EGD tomorrow. 2) PPI. 3) Monitor HGB and transfuse as necessary.  Ameena Vesey D 11/04/2022, 2:38 PM

## 2022-11-04 NOTE — Sepsis Progress Note (Addendum)
Sepsis protocol is being followed by [facility]. Antibiotics given prior to sepsis protocol orders.

## 2022-11-04 NOTE — Discharge Instructions (Signed)
° °  [facility] [place] ° °Discharge Instructions ° °Lower Extremity Angiogram; Angioplasty/Stenting ° °Please refer to the following instructions for your post-procedure care. Your surgeon or physician assistant will discuss any changes with you. ° °Activity ° °Avoid lifting more than 8 pounds (1 gallons of milk) for 72 hours (3 days) after your procedure. You may walk as much as you can tolerate. It's OK to drive after 72 hours. ° °Bathing/Showering ° °You may shower the day after your procedure. If you have a bandage, you may remove it at 24- 48 hours. Clean your incision site with mild soap and water. Pat the area dry with a clean towel. ° °Diet ° °Resume your pre-procedure diet. There are no special food restrictions following this procedure. All patients with peripheral vascular disease should follow a low fat/low cholesterol diet. In order to heal from your surgery, it is CRITICAL to get adequate nutrition. Your body requires vitamins, minerals, and protein. Vegetables are the best source of vitamins and minerals. Vegetables also provide the perfect balance of protein. Processed food has little nutritional value, so try to avoid this. ° °Medications ° °Resume taking all of your medications unless your doctor tells you not to. If your incision is causing pain, you may take over-the-counter pain relievers such as acetaminophen (Tylenol) ° °Follow Up ° °Follow up will be arranged at the time of your procedure. You may have an office visit scheduled or may be scheduled for surgery. Ask your surgeon if you have any questions. ° °Please call us immediately for any of the following conditions: °•Severe or worsening pain your legs or feet at rest or with walking. °•Increased pain, redness, drainage at your groin puncture site. °•Fever of 101 degrees or higher. °•If you have any mild or slow bleeding from your puncture site: lie down, apply firm constant pressure over the area with a piece of  gauze or a clean wash cloth for 30 minutes- no peeking!, call 911 right away if you are still bleeding after 30 minutes, or if the bleeding is heavy and unmanageable. ° °Reduce your risk factors of vascular disease: ° °Stop smoking. If you would like help call [facility] at 1-800-QUIT-NOW (1-800-784-8669) or [place] at 336-586-4000. °Manage your cholesterol °Maintain a desired weight °Control your diabetes °Keep your blood pressure down ° °If you have any questions, please call the office at [phone] ° °

## 2022-11-04 NOTE — Progress Notes (Signed)
Subjective:  Patient ID: Kenneth Bailey, male    DOB: 10-23-1965,  MRN: [id number]  Patient POD#4 s/p left fifth metatarsal head resection and irrigation and debridement. S/p abdominal aortogram with intervention and optimization from vascular.    Past Medical History:  Diagnosis Date   COPD (chronic obstructive pulmonary disease) (HCC)    Diabetes mellitus, type 2 (HCC)    HLD (hyperlipidemia)    Hypertension    Tobacco abuse      Past Surgical History:  Procedure Laterality Date   AMPUTATION Left 10/31/2022   Procedure: AMPUTATION LEFT FIFTH TOE;  Surgeon: Louann Sjogren, DPM;  Location: [facility] OR;  Service: Podiatry;  Laterality: Left;   AMPUTATION TOE Left 10/31/2022   Procedure: IRRIGATION AND DEBRIDEMENTOF LEFT FOOT;  Surgeon: Louann Sjogren, DPM;  Location: [facility] OR;  Service: Podiatry;  Laterality: Left;   CORONARY ARTERY BYPASS GRAFT N/A 06/10/2021   Procedure: CORONARY ARTERY BYPASS GRAFTING (CABG) TIMES FOUR, USING LEFT INTERNAL MAMMARY ARTERY AND RIGHT GREATER SAPHENOUS VEIN HARVESTED ENDOSCOPICALLY;  Surgeon: Lovett Sox, MD;  Location: [facility] OR;  Service: Open Heart Surgery;  Laterality: N/A;   ENDOVEIN HARVEST OF GREATER SAPHENOUS VEIN Right 06/10/2021   Procedure: ENDOVEIN HARVEST OF GREATER SAPHENOUS VEIN;  Surgeon: Lovett Sox, MD;  Location: [facility] OR;  Service: Open Heart Surgery;  Laterality: Right;   RIGHT/LEFT HEART CATH AND CORONARY ANGIOGRAPHY N/A 06/06/2021   Procedure: RIGHT/LEFT HEART CATH AND CORONARY ANGIOGRAPHY;  Surgeon: Dolores Patty, MD;  Location: [facility] INVASIVE [facility];  Service: Cardiovascular;  Laterality: N/A;   TEE WITHOUT CARDIOVERSION N/A 06/10/2021   Procedure: TRANSESOPHAGEAL ECHOCARDIOGRAM (TEE);  Surgeon: Lovett Sox, MD;  Location[facility] OR;  Service: Open Heart Surgery;  Laterality: N/A;       Latest Ref Rng & Units 11/04/2022    1:23 AM 11/03/2022    1:45 AM 11/02/2022    3:32 AM  CBC  WBC 4.0 - 10.5 K/uL 12.9  14.0  13.2   Hemoglobin  13.0 - 17.0 g/dL 9.7  82.9  56.2   Hematocrit 39.0 - 52.0 % 29.3  35.3  34.8   Platelets 150 - 400 K/uL 305  326  306        Latest Ref Rng & Units 11/04/2022    1:23 AM 11/03/2022    1:45 AM 11/02/2022    3:32 AM  BMP  Glucose 70 - 99 mg/dL 130  865  784   BUN 6 - 20 mg/dL 39  34  37   Creatinine 0.61 - 1.24 mg/dL 6.96  2.95  2.84   Sodium 135 - 145 mmol/L 133  133  133   Potassium 3.5 - 5.1 mmol/L 4.1  4.8  4.8   Chloride 98 - 111 mmol/L 97  97  97   CO2 22 - 32 mmol/L 24  21  24    Calcium 8.9 - 10.3 mg/dL 9.1  9.9  9.6      Objective:   Vitals:   11/04/22 0345 11/04/22 0825  BP: 127/76 (!) 151/66  Pulse: 94   Resp: 20 20  Temp: 98.3 F (36.8 C)   SpO2: 98%     General:AA&O x 3. Normal mood and affect   Vascular: DP and PT faintly palpable bilateral. Brisk capillary refill to all digits. Pedal hair present   Neruological. Epicritic sensation grossly intact.   Derm: Incision well coapted, No signes of dehiscence. Increased necrosis to proximal incision and some duskiness and erythema noted to proximal incisoin and  up dorsal foot.         MSK: MMT 5/5 in dorsiflexion, plantar flexion, inversion and eversion. Normal joint ROM without pain or crepitus.      Right ABIs and TBIs appear essentially unchanged. Left ABIs and TBIs  appear decreased.    Summary:  Right: Resting right ankle-brachial index indicates moderate right lower  extremity arterial disease. The right toe-brachial index is abnormal.   Left: Resting left ankle-brachial index indicates moderate left lower  extremity arterial disease.     Assessment & Plan:  Patient was evaluated and treated and all questions answered.  DX: POD #4 s/p fifth metatarsal head resection and irrigation and debridement  Daily betadine WTD dressing changes to lateral foot.  DME: Heel weight bearing in post-op shoe   Cultures growing staph aureus Appreciate ID reccomendations.  WBC 12.9 trending down.  S/p  revascularization. Relates pain slightly improved but still significant.  May need re-intervention to clean up wound will give another 24 hours to see how improved blood flow helps heel area and may be able to continue with wound care outpatient.  Will continue to follow.   Louann Sjogren, DPM  Accessible via secure chat for questions or concerns.

## 2022-11-04 NOTE — Significant Event (Signed)
Rapid Response Event Note   Reason for Call :  Syncopal episode  Initial Focused Assessment:  Patient now back in bed A&Ox4 after ambulating to bathroom to have bowel movement. Returning to bed patient suffered syncopal event in which staff lowered him to the ground and then helped him to the bed. Immediately returning to bed patient had another large bowel movement and vomited x1. Stool dark in color and type 6. Emesis dark in appearance and mostly liquid (not coffee ground). Patient remains diaphoretic and nauseous. Lungs diminished. Heart tones fast. Skin clammy, no edema present.   150/78 (99) HR 121 RR 22 O2 98% 4L [place] Temp 97.6 oral CBG 252  Interventions:  Rectal temp 100.1 Sepsis Protocol Notify MD Fecal occult/ gastric occult samples H&H Discontinue ASA/plavix/lovenox Start PPI gtt 2nd PIV   Plan of Care:  Trend vitals per sepsis protocol orderset. GI consult per MD. Call back for further needs.  Event Summary:  MD Notified: O. Sheikh DO Call Time: 1319 Arrival Time: 1324 End Time: 1440  Truddie Crumble, RN

## 2022-11-04 NOTE — Progress Notes (Signed)
At 1315 Pt ambulated with walker to bathroom.  On way back from bathroom pt experienced a syncopal episode.  This RN was behind pt and guided him gently to floor on buttocks.  Pt  went unresponsive however pt never lost femoral pulse.  Called for assistance and Thayer Ohm mobility specialist, CN [place], and [place] NT helped get pt to bed.  Pt did not have any lacerations and all bone remained intact after guided fall.  Paged Rapid response once we got back safely back in bed. Erick Blinks, RN

## 2022-11-05 ENCOUNTER — Inpatient Hospital Stay (HOSPITAL_COMMUNITY): Payer: BC Managed Care – PPO | Admitting: Anesthesiology

## 2022-11-05 ENCOUNTER — Encounter (HOSPITAL_COMMUNITY)
Admission: EM | Disposition: A | Discharge status: Home / Self Care | Payer: Self-pay | Source: Home / Self Care | Attending: Internal Medicine

## 2022-11-05 ENCOUNTER — Encounter (HOSPITAL_COMMUNITY): Payer: Self-pay | Admitting: Internal Medicine

## 2022-11-05 DIAGNOSIS — M86172 Other acute osteomyelitis, left ankle and foot: Secondary | ICD-10-CM | POA: Diagnosis not present

## 2022-11-05 DIAGNOSIS — L02612 Cutaneous abscess of left foot: Secondary | ICD-10-CM | POA: Diagnosis not present

## 2022-11-05 DIAGNOSIS — K253 Acute gastric ulcer without hemorrhage or perforation: Secondary | ICD-10-CM

## 2022-11-05 DIAGNOSIS — A4902 Methicillin resistant Staphylococcus aureus infection, unspecified site: Secondary | ICD-10-CM | POA: Diagnosis not present

## 2022-11-05 DIAGNOSIS — K209 Esophagitis, unspecified without bleeding: Secondary | ICD-10-CM

## 2022-11-05 DIAGNOSIS — K260 Acute duodenal ulcer with hemorrhage: Secondary | ICD-10-CM

## 2022-11-05 HISTORY — PX: BIOPSY: SHX5522

## 2022-11-05 HISTORY — PX: ESOPHAGOGASTRODUODENOSCOPY (EGD) WITH PROPOFOL: SHX5813

## 2022-11-05 LAB — CBC WITH DIFFERENTIAL/PLATELET
Abs Immature Granulocytes: 0.54 10*3/uL — ABNORMAL HIGH (ref 0.00–0.07)
Basophils Absolute: 0.1 10*3/uL (ref 0.0–0.1)
Basophils Relative: 1 %
Eosinophils Absolute: 0.2 10*3/uL (ref 0.0–0.5)
Eosinophils Relative: 1 %
HCT: 25.7 % — ABNORMAL LOW (ref 39.0–52.0)
Hemoglobin: 9 g/dL — ABNORMAL LOW (ref 13.0–17.0)
Immature Granulocytes: 4 %
Lymphocytes Relative: 12 %
Lymphs Abs: 1.6 10*3/uL (ref 0.7–4.0)
MCH: 28.8 pg (ref 26.0–34.0)
MCHC: 35 g/dL (ref 30.0–36.0)
MCV: 82.4 fL (ref 80.0–100.0)
Monocytes Absolute: 0.9 10*3/uL (ref 0.1–1.0)
Monocytes Relative: 7 %
Neutro Abs: 9.7 10*3/uL — ABNORMAL HIGH (ref 1.7–7.7)
Neutrophils Relative %: 75 %
Platelets: 248 10*3/uL (ref 150–400)
RBC: 3.12 MIL/uL — ABNORMAL LOW (ref 4.22–5.81)
RDW: 14.3 % (ref 11.5–15.5)
WBC: 13 10*3/uL — ABNORMAL HIGH (ref 4.0–10.5)
nRBC: 0 % (ref 0.0–0.2)

## 2022-11-05 LAB — COMPREHENSIVE METABOLIC PANEL
ALT: 11 U/L (ref 0–44)
AST: 11 U/L — ABNORMAL LOW (ref 15–41)
Albumin: 2.2 g/dL — ABNORMAL LOW (ref 3.5–5.0)
Alkaline Phosphatase: 50 U/L (ref 38–126)
Anion gap: 16 — ABNORMAL HIGH (ref 5–15)
BUN: 58 mg/dL — ABNORMAL HIGH (ref 6–20)
CO2: 21 mmol/L — ABNORMAL LOW (ref 22–32)
Calcium: 8.5 mg/dL — ABNORMAL LOW (ref 8.9–10.3)
Chloride: 98 mmol/L (ref 98–111)
Creatinine, Ser: 1.08 mg/dL (ref 0.61–1.24)
GFR, Estimated: >60 mL/min (ref >60)
Glucose, Bld: 215 mg/dL — ABNORMAL HIGH (ref 70–99)
Potassium: 4.1 mmol/L (ref 3.5–5.1)
Sodium: 135 mmol/L (ref 135–145)
Total Bilirubin: 0.5 mg/dL (ref 0.3–1.2)
Total Protein: 5.5 g/dL — ABNORMAL LOW (ref 6.5–8.1)

## 2022-11-05 LAB — GLUCOSE, CAPILLARY
Glucose-Capillary: 197 mg/dL — ABNORMAL HIGH (ref 70–99)
Glucose-Capillary: 159 mg/dL — ABNORMAL HIGH (ref 70–99)
Glucose-Capillary: 205 mg/dL — ABNORMAL HIGH (ref 70–99)
Glucose-Capillary: 256 mg/dL — ABNORMAL HIGH (ref 70–99)

## 2022-11-05 LAB — TYPE AND SCREEN

## 2022-11-05 LAB — AEROBIC/ANAEROBIC CULTURE W GRAM STAIN (SURGICAL/DEEP WOUND)

## 2022-11-05 LAB — BPAM RBC

## 2022-11-05 LAB — HEMOGLOBIN AND HEMATOCRIT, BLOOD
HCT: 25.2 % — ABNORMAL LOW (ref 39.0–52.0)
HCT: 23.1 % — ABNORMAL LOW (ref 39.0–52.0)
Hemoglobin: 8.2 g/dL — ABNORMAL LOW (ref 13.0–17.0)
Hemoglobin: 7.4 g/dL — ABNORMAL LOW (ref 13.0–17.0)

## 2022-11-05 LAB — CULTURE, BLOOD (ROUTINE X 2): Culture: NO GROWTH

## 2022-11-05 LAB — MAGNESIUM: Magnesium: 1.7 mg/dL (ref 1.7–2.4)

## 2022-11-05 LAB — PHOSPHORUS: Phosphorus: 3.2 mg/dL (ref 2.5–4.6)

## 2022-11-05 SURGERY — ESOPHAGOGASTRODUODENOSCOPY (EGD) WITH PROPOFOL
Anesthesia: Monitor Anesthesia Care

## 2022-11-05 MED ORDER — OXYCODONE HCL 5 MG PO TABS
5.0000 mg | ORAL_TABLET | Freq: Once | ORAL | Status: AC | PRN
  Administered 2022-11-05: 5 mg via ORAL

## 2022-11-05 MED ORDER — SODIUM CHLORIDE 0.9 % IV SOLN: INTRAVENOUS | Status: DC

## 2022-11-05 MED ORDER — PROPOFOL 500 MG/50ML IV EMUL
INTRAVENOUS | Status: DC | PRN
  Administered 2022-11-05: 180 ug/kg/min via INTRAVENOUS

## 2022-11-05 MED ORDER — MAGNESIUM SULFATE 2 GM/50ML IV SOLN
2.0000 g | Freq: Once | INTRAVENOUS | Status: AC
  Administered 2022-11-05: 2 g via INTRAVENOUS
  Filled 2022-11-05: qty 50

## 2022-11-05 MED ORDER — OXYCODONE HCL 5 MG/5ML PO SOLN: 5.0000 mg | Freq: Once | ORAL | Status: AC | PRN

## 2022-11-05 MED ORDER — LIDOCAINE 2% (20 MG/ML) 5 ML SYRINGE
INTRAMUSCULAR | Status: DC | PRN
  Administered 2022-11-05: 40 mg via INTRAVENOUS

## 2022-11-05 MED ORDER — PROPOFOL 10 MG/ML IV BOLUS
INTRAVENOUS | Status: DC | PRN
  Administered 2022-11-05: 20 mg via INTRAVENOUS

## 2022-11-05 MED ORDER — FENTANYL CITRATE (PF) 100 MCG/2ML IJ SOLN
INTRAMUSCULAR | Status: AC
  Filled 2022-11-05: qty 2

## 2022-11-05 MED ORDER — ONDANSETRON HCL 4 MG/2ML IJ SOLN: 4.0000 mg | Freq: Four times a day (QID) | INTRAMUSCULAR | Status: DC | PRN

## 2022-11-05 MED ORDER — OXYCODONE HCL 5 MG PO TABS
ORAL_TABLET | ORAL | Status: AC
  Administered 2022-11-05: 5 mg via ORAL
  Filled 2022-11-05: qty 1

## 2022-11-05 MED ORDER — LACTATED RINGERS IV SOLN: INTRAVENOUS | Status: DC | PRN

## 2022-11-05 MED ORDER — FENTANYL CITRATE (PF) 100 MCG/2ML IJ SOLN
25.0000 ug | INTRAMUSCULAR | Status: DC | PRN
  Administered 2022-11-05: 50 ug via INTRAVENOUS

## 2022-11-05 SURGICAL SUPPLY — 15 items

## 2022-11-05 NOTE — Transfer of Care (Signed)
Immediate Anesthesia Transfer of Care Note  Patient: Kenneth Bailey  Procedure(s) Performed: ESOPHAGOGASTRODUODENOSCOPY (EGD) WITH PROPOFOL BIOPSY  Patient Location: PACU  Anesthesia Type:MAC  Level of Consciousness: sedated  Airway & Oxygen Therapy: Patient Spontanous Breathing and Patient connected to nasal cannula oxygen  Post-op Assessment: Report given to RN and Post -op Vital signs reviewed and stable  Post vital signs: Reviewed and stable  Last Vitals:  Vitals Value Taken Time  BP 86/52 11/05/22 1109  Temp    Pulse 66 11/05/22 1110  Resp 14 11/05/22 1110  SpO2 100 % 11/05/22 1110  Vitals shown include unvalidated device data.  Last Pain:  Vitals:   11/05/22 1018  TempSrc: Temporal  PainSc: 6       Patients Stated Pain Goal: 2 (11/04/22 0419)  Complications: No notable events documented.

## 2022-11-05 NOTE — Anesthesia Postprocedure Evaluation (Signed)
Anesthesia Post Note  Patient: Erhardt Mancil  Procedure(s) Performed: ESOPHAGOGASTRODUODENOSCOPY (EGD) WITH PROPOFOL BIOPSY     Patient location during evaluation: PACU Anesthesia Type: MAC Level of consciousness: awake and alert Pain management: pain level controlled Vital Signs Assessment: post-procedure vital signs reviewed and stable Respiratory status: spontaneous breathing, nonlabored ventilation, respiratory function stable and patient connected to nasal cannula oxygen Cardiovascular status: stable and blood pressure returned to baseline Postop Assessment: no apparent nausea or vomiting Anesthetic complications: no   No notable events documented.  Last Vitals:  Vitals:   11/05/22 1145 11/05/22 1150  BP: 130/74   Pulse: 72 73  Resp: 13 16  Temp:  36.7 C  SpO2: 100% 100%    Last Pain:  Vitals:   11/05/22 1150  TempSrc:   PainSc: 4                  Myalynn Lingle S

## 2022-11-05 NOTE — Plan of Care (Signed)
Attempted to round on patient today and down in Endoscopy for procedure. Will attempt to round on him tomorrow.

## 2022-11-05 NOTE — Progress Notes (Signed)
PT Cancellation Note  Patient Details Name: Kenneth Bailey MRN: [id number] DOB: 29-Sep-1965   Cancelled Treatment:    Reason Eval/Treat Not Completed: Patient declined, no reason specified. PT attempted to see pt for treatment however pt declines for multiple reasons, citing procedure earlier in the day, already mobilizing some, and significant pain. PT attempts to provide encouragement for progressive mobilization however pt continues to refuse.   Arlyss Gandy 11/05/2022, 3:23 PM

## 2022-11-05 NOTE — Op Note (Addendum)
[facility] Patient Name: Kenneth Bailey Procedure Date : 11/05/2022 MRN: [id number] Attending MD: Jeani Hawking , MD, [id number] Date of Birth: 22-Mar-1966 CSN: [id number] Age: 57 Admit Type: Inpatient Procedure:                Upper GI endoscopy Indications:              Coffee-ground emesis Providers:                Jeani Hawking, MD Referring MD:              Medicines:                Propofol per Anesthesia Complications:            No immediate complications. Estimated Blood Loss:     Estimated blood loss: none. Procedure:                Pre-Anesthesia Assessment:                           - Prior to the procedure, a History and Physical                            was performed, and patient medications and                            allergies were reviewed. The patient's tolerance of                            previous anesthesia was also reviewed. The risks                            and benefits of the procedure and the sedation                            options and risks were discussed with the patient.                            All questions were answered, and informed consent                            was obtained. Prior Anticoagulants: The patient has                            taken no anticoagulant or antiplatelet agents. ASA                            Grade Assessment: III - A patient with severe                            systemic disease. After reviewing the risks and                            benefits, the patient was deemed in satisfactory  condition to undergo the procedure.                           - Sedation was administered by an anesthesia                            professional. Deep sedation was attained.                           After obtaining informed consent, the endoscope was                            passed under direct vision. Throughout the                            procedure, the patient's blood pressure,  pulse, and                            oxygen saturations were monitored continuously. The                            [id number]-[id number] ([id number]) Olympus endoscope was introduced                            through the mouth, and advanced to the second part                            of duodenum. The upper GI endoscopy was                            accomplished without difficulty. The patient                            tolerated the procedure well. Scope In: Scope Out: Findings:      LA Grade D (one or more mucosal breaks involving at least 75% of       esophageal circumference) esophagitis with no bleeding was found in the       lower third of the esophagus.      Few non-bleeding superficial gastric ulcers with a clean ulcer base       (Forrest Class III) were found in the gastric antrum. The largest lesion       was 5 mm in largest dimension. Biopsies were taken with a cold forceps       for Helicobacter pylori testing.      One non-bleeding superficial duodenal ulcer with a flat pigmented spot       (Forrest Class IIc) was found in the second portion of the duodenum. The       lesion was 12 mm in largest dimension.      A superficial ulcer with a hemocystic spot was noted in the distal       portion of the bulb/proximal D2. It was not bleeding. Impression:               - LA Grade D reflux esophagitis with no bleeding.                           -  Non-bleeding gastric ulcers with a clean ulcer                            base (Forrest Class III). Biopsied.                           - Non-bleeding duodenal ulcer with a flat pigmented                            spot (Forrest Class IIc). Recommendation:           - Return patient to hospital ward for ongoing care.                           - Resume regular diet.                           - Continue present medications.                           - Resume ASA and Plavix tomorrow as he is s/p                            stenting.                            - [facility] to assume care in the AM. Procedure Code(s):        --- Professional ---                           [phone], Esophagogastroduodenoscopy, flexible,                            transoral; with biopsy, single or multiple Diagnosis Code(s):        --- Professional ---                           K21.00, Gastro-esophageal reflux disease with                            esophagitis, without bleeding                           K25.9, Gastric ulcer, unspecified as acute or                            chronic, without hemorrhage or perforation                           K26.9, Duodenal ulcer, unspecified as acute or                            chronic, without hemorrhage or perforation                           K92.0, Hematemesis CPT copyright 2022 American [employer]. All rights reserved. The codes  documented in this report are preliminary and upon coder review may  be revised to meet current compliance requirements. Jeani Hawking, MD Jeani Hawking, MD 11/05/2022 11:16:33 AM This report has been signed electronically. Number of Addenda: 0

## 2022-11-05 NOTE — Anesthesia Preprocedure Evaluation (Signed)
Anesthesia Evaluation  Patient identified by MRN, date of birth, ID band Patient awake    Reviewed: Allergy & Precautions, H&P , NPO status , Patient's Chart, lab work & pertinent test results  Airway Mallampati: II   Neck ROM: full    Dental   Pulmonary COPD, Current Smoker and Patient abstained from smoking.   breath sounds clear to auscultation       Cardiovascular hypertension, + CAD and + CABG   Rhythm:regular Rate:Normal     Neuro/Psych    GI/Hepatic GI bleed   Endo/Other  diabetes, Type 2    Renal/GU      Musculoskeletal   Abdominal   Peds  Hematology  (+) Blood dyscrasia, anemia Hemoglobin 9.0   Anesthesia Other Findings   Reproductive/Obstetrics                             Anesthesia Physical Anesthesia Plan  ASA: 3  Anesthesia Plan: MAC   Post-op Pain Management:    Induction: Intravenous  PONV Risk Score and Plan: 0 and Propofol infusion and Treatment may vary due to age or medical condition  Airway Management Planned: Nasal Cannula  Additional Equipment:   Intra-op Plan:   Post-operative Plan:   Informed Consent: I have reviewed the patients History and Physical, chart, labs and discussed the procedure including the risks, benefits and alternatives for the proposed anesthesia with the patient or authorized representative who has indicated his/her understanding and acceptance.     Dental advisory given  Plan Discussed with: CRNA, Anesthesiologist and Surgeon  Anesthesia Plan Comments:        Anesthesia Quick Evaluation

## 2022-11-05 NOTE — Progress Notes (Signed)
PROGRESS NOTE    Kenneth Bailey  YQM:[id number] DOB: November 27, 1965 DOA: 10/30/2022 PCP: [name], MD   Brief Narrative:  The patient is a 57 year old chronically ill-appearing Caucasian male with a past medical history significant for but not limited to uncontrolled diabetes mellitus type 2, CAD status post CABG, chronic combined systolic and diastolic CHF, tobacco abuse as well as other comorbidities who presented to [facility] emergency department at the recommendation of his podiatrist given his persistent left foot pain and concern for osteomyelitis of the left fifth toe.  He has been suffering with a left foot diabetic ulcer for approximately 4 weeks and will prescribe a course of antibiotics through the hospital hospital ED and was referred to podiatry who did follow him for approximate 1 week.  Imaging was done and the podiatrist evaluated and there is finding concerns for osteomyelitis.  Patient was contacted and instructed to go to [name] ED.  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    He was taken to the OR and is status post left foot incision and drainage with resection of fifth metatarsal head on 6/11 with cultures currently growing MRSA.  ID has been consulted for further evaluation recommendations. Given proximal incision necrosis, Podiatry recommends Vascular Consult for evaluation for wound healing and Vascular consulted and recommending obtaining an angiogram of the left lower leg with possible intervention through transfemoral access given that they suspect that he has tibial disease.  Patient was taken to the Cath Lab 11/03/22 and he underwent a left posterior tibial artery and tibioperoneal trunk angioplasty and was placed on aspirin and Plavix but no statin given his  allergy.  Vascular surgery feels that he is optimized from their standpoint PT to reevaluate and treat.  Today he was nauseous and vomiting and then syncopized and had hematemesis as well as melanotic stools.  His aspirin Plavix was discontinued and he was placed on a PPI drip, given 1 L boluses and a stat CBC was done which showed that his blood count dropped so he is getting 2 units of PRBCs.  GI was consulted and planning for EGD in the a.m. and he has been made n.p.o..  Assessment and Plan:  Left Fifth Toe and Lateral Foot Cellulitis with Osteomyelitis -Continued empiric Antibiotics with IV Cefepime q grams q12h and IV Vancomycin; Infectious disease has now stopped the IV cefepime and recommending giving the patient Oritavancin (11/04/22) as the long acting and then have patient do additional 14 day course of Doxycycline 100mg  po bid to start on next Saturday on 11/11/22.  -ABIs done and showed "Right: Resting right  ankle-brachial index indicates moderate right lower  extremity arterial disease. The right toe-brachial index is abnormal. 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 as well as phlegmon/early abscess formation  -WBC Trend: Recent Labs  Lab 10/31/22 0450 11/01/22 0059 11/02/22 0332 11/03/22 0145 11/04/22 0123 11/04/22 1450 11/04/22 1730 11/05/22 0547  WBC 14.2* 16.5* 13.2* 14.0* 12.9* 17.5*  --  13.0*  LATICACIDVEN  --   --   --   --   --  1.8 1.3  --   -Patient's  leukocytosis has now worsened in the setting of GI bleeding but is now trending down -The patient is s/p left foot I&D with resection of fifth metatarsal head 6/11 in OR -Blood Cx x2 showing NGTD at 5 Days and now Blood Cx being repeated today per the rapid nurse -Wound Cx obtained and showing: Gram  Stain NO WBC SEEN RARE GRAM POSITIVE COCCI IN PAIRS RARE GRAM NEGATIVE RODS  Culture FEW METHICILLIN RESISTANT STAPHYLOCOCCUS AUREUS NO ANAEROBES ISOLATED Performed at [facility], [address] [place], [place] [address]  Report Status 11/05/2022 FINAL  Organism ID, Bacteria METHICILLIN RESISTANT STAPHYLOCOCCUS AUREUS  Resulting Agency [facility]     Susceptibility   Methicillin resistant staphylococcus aureus    MIC    CIPROFLOXACIN >=8 RESISTANT Resistant    CLINDAMYCIN >=8 RESISTANT Resistant    ERYTHROMYCIN >=8 RESISTANT Resistant    GENTAMICIN <=0.5 SENSI... Sensitive    Inducible Clindamycin NEGATIVE Sensitive    LINEZOLID 2 SENSITIVE Sensitive    OXACILLIN >=4 RESISTANT Resistant    RIFAMPIN <=0.5 SENSI... Sensitive    TETRACYCLINE <=1 SENSITIVE Sensitive    TRIMETH/SULFA 160 RESISTANT Resistant    VANCOMYCIN <=0.5 SENSI... Sensitive         -Continue with po Oxycodone 5 mg po q6hprn Moderate/Breakthrough Pain and IV Hydromorphone 0.5 mg IV q4hprn Severe Pain  -Consulted ID for further evaluation and recommendations given his Infection and Resection however podiatry feels that there is surgical cure of his bone infection; ID now recommends Oritavancin 1200 mg today and changing to Oral Doxycycline at Discharge starting 11/11/22 -Podiatry is recommending heel weightbearing in the postoperative shoe -PT OT consulted and will need to re-evaluate given GIB -He had some necrosis of the proximal incision so the podiatrist had recommended obtaining a vascular consultation given his concern for wound healing and vascular Dr. Chestine Spore been consulted -Vascular did an Abdominal aortogram with left lower extremity runoff  and he had a left posterior tibial artery and tibioperoneal trunk angioplasty and was placed on Plavix -Vascular feels that the patient has inline flow down the left lower extremity to the posterior tibial artery retrograde after his and feels that he is  optimized from a vascular standpoint and recommending Aspirin Plavix however this is now been stopped given his GI bleeding,: GI recommends that he can be resumed on 11/06/2022   Acute Kidney Injury, improved Metabolic Acidosis -Likely prerenal in setting of infection with poor intake -Baseline creatinine approximately 1.3 with creatinine 2.9 at presentation  -BUN/Cr Trend: Recent Labs  Lab 10/31/22 0450 11/01/22 0059 11/02/22 0332 11/03/22 0145 11/04/22 0123 11/04/22 1450 11/05/22 0547  BUN 49* 53* 37* 34* 39* 59* 58*  CREATININE 2.01* 1.47* 1.09 1.11 1.03 1.12 1.08  -He has a slight metabolic acidosis with a CO2 of 21, anion gap of 16 and chloride level of 98 -IVF now stopped -Avoid Nephrotoxic Medications, Contrast Dyes, Hypotension and Dehydration to Ensure Adequate Renal Perfusion and will need to Renally Adjust Meds -Continue to Monitor and Trend Renal Function carefully and repeat CMP in the AM   Chronic Combined Systolic and Diastolic CHF -Euvolemic/somewhat dehydrated at presentation -follow with gentle volume expansion  Intake/Output Summary (Last 24 hours) at 11/05/2022 1542 Last data filed at 11/05/2022 1400 Gross per 24 hour  Intake 2407.99 ml  Output 750 ml  Net 1657.99 ml  -Strict I's and O's and Daily Weights -Given a 1 Liter Bolus of NS at 500 mL/hr and will get 2 units of pRBCs -  Continue to Monitor for Signs and Symptoms of Volume Overload now that he is getting blood and Fluid  Syncopal Episode in the setting of ABLA from Ulcers -Setting of GI bleeding and patient passed out after coming back from the bathroom and was lowered to the floor by nursing staff -Was placed on a Protonix drip and getting 2 units of blood and received a 1 L bolus -Continue with progressive care monitoring and will need PT OT to further evaluate and treat now that he is more stable  Nausea and Vomiting, improved -In the setting of Abx and now GI Bleeding -Start IV Zofran and IV  Compazine   Tobacco Abuse -Has been counseled on the absolute need to discontinue Tobacco completely -Continue with nicotine patch 14 mg transdermal every 24 hours  HLD -C/w Gemfibrozil 600 mg po BID  -Repeat panel done and showed a total cholesterol/HDL ratio 7.0, cholesterol level 140, HDL 21, LDL 76, triglycerides 253, VLDL 51   CAD status post CABG -Asymptomatic at present -Holding aspirin 81 mg p.o. daily as well as now clopidogrel 75 mg p.o. daily given GI bleeding yesterday and is found to have gastric ulcers and duodenal ulcer as well as LA grade D reflux esophagitis with no bleeding -Not on a Statin due to Adverse Effects/Allergies    Uncontrolled DM2 -CBG controlled as an inpatient  -Recent A1c was 10.7 -C/w Diabetic education -Continue to Monitor CBGs per protocol; CBG Trend: Recent Labs  Lab 11/04/22 0600 11/04/22 1146 11/04/22 1324 11/04/22 1705 11/04/22 2114 11/05/22 0608 11/05/22 1113  GLUCAP 208* 254* 252* 348* 291* 197* 159*   Normocytic Anemia and now ABLA in the setting of upper GI bleed with LA grade D reflux esophagitis and nonbleeding gastric ulcers with a clean base and a nonbleeding duodenal ulcer with a flat pigmented spot with Hematemesis and Melena -Hgb/Hct Trend: Recent Labs  Lab 11/02/22 0332 11/03/22 0145 11/04/22 0123 11/04/22 1450 11/04/22 1943 11/05/22 0547 11/05/22 1420  HGB 11.4* 11.5* 9.7* 7.0* 7.1* 9.0* 8.2*  HCT 34.8* 35.3* 29.3* 21.0* 21.9* 25.7* 25.2*  MCV 84.5 84.9 81.6 84.7  --  82.4  --   -Checked Anemia Panel and showed an iron level of 17, UIBC of 299, TIBC 360, saturation ratios 5%, ferritin 193, folate 9.5, vitamin B12 165 -Initiated on iron supplementation but this will not be stopped given his dark emesis and melanotic stools -Start IV PPI and type and screen and transfuse 2 units of PRBCs given that hemoglobin has now dropped to 7 -Obtain gastric occult testing and FOBT testing; FOBT + -Given 1 L bolus of normal  saline -Held his aspirin and Plavix but GI recommending that he can be resumed 11/06/2022 -Continue to Monitor for S/Sx of Bleeding; No overt bleeding noted now -Repeat CBC in the AM  GERD/GI prophylaxis/gastric ulcer and duodenal ulcer -On PPI drip and likely will need twice daily PPI -Gastroenterology following  Hypoalbuminemia -Patient's Albumin Trend: Recent Labs  Lab 11/02/22 0332 11/03/22 0145 11/04/22 0123 11/04/22 1450 11/05/22 0547  ALBUMIN 2.9* 3.1* 2.4* 2.4* 2.2*  -Continue to Monitor and Trend and repeat CMP in the AM   DVT prophylaxis: SCDs Start: 10/30/22 2211    Code Status: Full Code Family Communication: No family present at bedside  Disposition Plan:  Level of care: Progressive Cardiac Status is: Inpatient Remains inpatient appropriate because: Needs further clinical improvement and need to ensure patient does not have any further melena   Consultants:  Podiatry  Infectious diseases Vascular  surgery Gastroenterology   Procedures:  As delineated as above   POD #4 s/p fifth metatarsal head resection and irrigation and debridement    Procedure Performed: 1.  Ultrasound-guided access right common femoral artery 2.  Aortogram with catheter selection of aorta 3.  Left lower extremity arteriogram with selection of third order branches including the left below-knee popliteal artery 4.  Ultrasound-guided access left posterior tibial artery at the ankle retrograde with pedal sheath placement 5.  Left posterior tibial artery and tibioperoneal trunk angioplasty (3 mm x 220 mm Sterling) 6.  Mynx closure of the right common femoral artery 7.  38 minutes of monitored moderate conscious sedation time  Findings:      LA Grade D (one or more mucosal breaks involving at least 75% of       esophageal circumference) esophagitis with no bleeding was found in the       lower third of the esophagus.      Few non-bleeding superficial gastric ulcers with a clean ulcer  base       (Forrest Class III) were found in the gastric antrum. The largest lesion       was 5 mm in largest dimension. Biopsies were taken with a cold forceps       for Helicobacter pylori testing.      One non-bleeding superficial duodenal ulcer with a flat pigmented spot       (Forrest Class IIc) was found in the second portion of the duodenum. The       lesion was 12 mm in largest dimension.      A superficial ulcer with a hemocystic spot was noted in the distal       portion of the bulb/proximal D2. It was not bleeding. Impression:               - LA Grade D reflux esophagitis with no bleeding.                           - Non-bleeding gastric ulcers with a clean ulcer                            base (Forrest Class III). Biopsied.                           - Non-bleeding duodenal ulcer with a flat pigmented                            spot (Forrest Class IIc). Recommendation:           - Return patient to hospital ward for ongoing care.                           - Resume regular diet.                           - Continue present medications.                           - Resume ASA and Plavix tomorrow as he is s/p  stenting.                           - Corder GI to assume care in the AM.  Antimicrobials:  Anti-infectives (From admission, onward)    Start     Dose/Rate Route Frequency Ordered Stop   11/11/22 1000  doxycycline (VIBRA-TABS) tablet 100 mg        100 mg Oral Every 12 hours 11/03/22 1616 11/25/22 0959   11/04/22 0800  Oritavancin Diphosphate (ORBACTIV) 1,200 mg in dextrose 5 % IVPB        1,200 mg 333.3 mL/hr over 180 Minutes Intravenous Once 11/03/22 1611 11/04/22 1158   11/03/22 0500  vancomycin (VANCOREADY) IVPB 1500 mg/300 mL  Status:  Discontinued        1,500 mg 150 mL/hr over 120 Minutes Intravenous Every 24 hours 11/02/22 1439 11/03/22 1611   11/02/22 1445  ceFEPIme (MAXIPIME) 2 g in sodium chloride 0.9 % 100 mL IVPB  Status:   Discontinued        2 g 200 mL/hr over 30 Minutes Intravenous Every 8 hours 11/02/22 1437 11/03/22 1430   11/02/22 0500  vancomycin (VANCOREADY) IVPB 1250 mg/250 mL  Status:  Discontinued        1,250 mg 166.7 mL/hr over 90 Minutes Intravenous Every 24 hours 11/01/22 0721 11/02/22 1439   11/01/22 0430  vancomycin (VANCOCIN) IVPB 1000 mg/200 mL premix        1,000 mg 200 mL/hr over 60 Minutes Intravenous  Once 11/01/22 0339 11/01/22 0620   11/01/22 0430  ceFEPIme (MAXIPIME) 2 g in sodium chloride 0.9 % 100 mL IVPB  Status:  Discontinued        2 g 200 mL/hr over 30 Minutes Intravenous Every 12 hours 11/01/22 0342 11/02/22 1437   11/01/22 0400  ceFEPIme (MAXIPIME) 2 g in sodium chloride 0.9 % 100 mL IVPB  Status:  Discontinued        2 g 200 mL/hr over 30 Minutes Intravenous Every 24 hours 10/31/22 0342 11/01/22 0341   10/31/22 0430  ceFEPIme (MAXIPIME) 2 g in sodium chloride 0.9 % 100 mL IVPB        2 g 200 mL/hr over 30 Minutes Intravenous  Once 10/31/22 0342 10/31/22 0434   10/30/22 2138  vancomycin variable dose per unstable renal function (pharmacist dosing)  Status:  Discontinued         Does not apply See admin instructions 10/30/22 2138 11/01/22 0721   10/30/22 1745  vancomycin (VANCOREADY) IVPB 1500 mg/300 mL        1,500 mg 150 mL/hr over 120 Minutes Intravenous  Once 10/30/22 1743 10/30/22 2058       Subjective: Seen and examined at bedside and he is feeling little bit better.  Has not vomited any more black vomitus and has not had any more dark stools.  Had a syncopal episode yesterday.  Denies chest pain or shortness of thinks his pain is foot doing okay.  No other concerns or complaints this time.  Objective: Vitals:   11/05/22 1215 11/05/22 1245 11/05/22 1300 11/05/22 1500  BP: 131/67 128/75 136/69 (!) 114/58  Pulse: 71 68 73 78  Resp:      Temp:      TempSrc:      SpO2: 99% 99% 100% 97%  Weight:      Height:        Intake/Output Summary (Last 24 hours) at  11/05/2022 1546 Last data  filed at 11/05/2022 1400 Gross per 24 hour  Intake 2407.99 ml  Output 750 ml  Net 1657.99 ml   Filed Weights   11/02/22 0500 11/04/22 0345 11/05/22 0606  Weight: 71.3 kg 72.6 kg 72.6 kg   Examination: Physical Exam:  Constitutional: WN/WD thin Caucasian male who appears better today compared to yesterday Respiratory: Diminished to auscultation bilaterally, no wheezing, rales, rhonchi or crackles. Normal respiratory effort and patient is not tachypenic. No accessory muscle use.  Unlabored breathing Cardiovascular: Tachycardic rate but regular rhythm, no murmurs / rubs / gallops. S1 and S2 auscultated. No extremity edema.  Abdomen: Soft, non-tender, not really distended. Bowel sounds positive.  GU: Deferred. Musculoskeletal: No clubbing / cyanosis of digits/nails.  Left foot is wrapped Skin: No rashes, lesions, ulcers on limited skin evaluation. No induration; Warm and dry.  Neurologic: CN 2-12 grossly intact with no focal deficits. Romberg sign and cerebellar reflexes not assessed.  Psychiatric: Normal judgment and insight. Alert and oriented x 3. Normal mood and appropriate affect.   Data Reviewed: I have personally reviewed following labs and imaging studies  CBC: Recent Labs  Lab 11/02/22 0332 11/03/22 0145 11/04/22 0123 11/04/22 1450 11/04/22 1943 11/05/22 0547 11/05/22 1420  WBC 13.2* 14.0* 12.9* 17.5*  --  13.0*  --   NEUTROABS 10.5* 11.8* 10.6* 14.9*  --  9.7*  --   HGB 11.4* 11.5* 9.7* 7.0* 7.1* 9.0* 8.2*  HCT 34.8* 35.3* 29.3* 21.0* 21.9* 25.7* 25.2*  MCV 84.5 84.9 81.6 84.7  --  82.4  --   PLT 306 326 305 374  --  248  --    Basic Metabolic Panel: Recent Labs  Lab 10/31/22 0450 11/01/22 0059 11/02/22 0332 11/03/22 0145 11/04/22 0123 11/04/22 1450 11/05/22 0547  NA 137 135 133* 133* 133* 132* 135  K 3.8 4.6 4.8 4.8 4.1 4.5 4.1  CL 100 97* 97* 97* 97* 97* 98  CO2 22 22 24  21* 24 23 21*  GLUCOSE 178* 152* 153* 176* 211* 325* 215*   BUN 49* 53* 37* 34* 39* 59* 58*  CREATININE 2.01* 1.47* 1.09 1.11 1.03 1.12 1.08  CALCIUM 9.4 9.6 9.6 9.9 9.1 9.2 8.5*  MG 2.0 2.2 1.8 1.7 1.5*  --  1.7  PHOS 5.0*  --  3.0 4.0 3.7  --  3.2   GFR: Estimated Creatinine Clearance: 74.8 mL/min (by C-G formula based on SCr of 1.08 mg/dL). Liver Function Tests: Recent Labs  Lab 11/02/22 0332 11/03/22 0145 11/04/22 0123 11/04/22 1450 11/05/22 0547  AST 13* 13* 15 15 11*  ALT 10 12 15 15 11   ALKPHOS 63 76 66 63 50  BILITOT 0.5 0.7 0.7 0.4 0.5  PROT 6.9 7.5 6.6 6.4* 5.5*  ALBUMIN 2.9* 3.1* 2.4* 2.4* 2.2*   No results for input(s): "LIPASE", "AMYLASE" in the last 168 hours. No results for input(s): "AMMONIA" in the last 168 hours. Coagulation Profile: Recent Labs  Lab 11/04/22 1450  INR 1.2   Cardiac Enzymes: No results for input(s): "CKTOTAL", "CKMB", "CKMBINDEX", "TROPONINI" in the last 168 hours. BNP (last 3 results) No results for input(s): "PROBNP" in the last 8760 hours. HbA1C: No results for input(s): "HGBA1C" in the last 72 hours. CBG: Recent Labs  Lab 11/04/22 1324 11/04/22 1705 11/04/22 2114 11/05/22 0608 11/05/22 1113  GLUCAP 252* 348* 291* 197* 159*   Lipid Profile: Recent Labs    11/04/22 0123  CHOL 148  HDL 21*  LDLCALC 76  TRIG 161*  CHOLHDL 7.0  Thyroid Function Tests: No results for input(s): "TSH", "T4TOTAL", "FREET4", "T3FREE", "THYROIDAB" in the last 72 hours. Anemia Panel: Recent Labs    11/03/22 0145  VITAMINB12 355  FOLATE 9.5  FERRITIN 193  TIBC 316  IRON 17*  RETICCTPCT 1.1   Sepsis Labs: Recent Labs  Lab 10/30/22 1658 10/30/22 2005 11/04/22 1450 11/04/22 1730  LATICACIDVEN 2.4* 1.2 1.8 1.3   Recent Results (from the past 240 hour(s))  Blood culture (routine x 2)     Status: None   Collection Time: 10/30/22  4:58 PM   Specimen: BLOOD  Result Value Ref Range Status   Specimen Description BLOOD LEFT FOREARM  Final   Special Requests   Final    BOTTLES DRAWN AEROBIC  AND ANAEROBIC Blood Culture adequate volume   Culture   Final    NO GROWTH 5 DAYS Performed at [facility], [address], [place], [place] [address]    Report Status 11/04/2022 FINAL  Final  Blood culture (routine x 2)     Status: None   Collection Time: 10/30/22  5:11 PM   Specimen: BLOOD  Result Value Ref Range Status   Specimen Description BLOOD RIGHT ANTECUBITAL  Final   Special Requests   Final    BOTTLES DRAWN AEROBIC AND ANAEROBIC Blood Culture results may not be optimal due to an excessive volume of blood received in culture bottles   Culture   Final    NO GROWTH 5 DAYS Performed at [facility], [address] [place], [place] [address]    Report Status 11/04/2022 FINAL  Final  MRSA Next Gen by PCR, Nasal     Status: None   Collection Time: 10/31/22  3:44 AM   Specimen: Nasal Mucosa; Nasal Swab  Result Value Ref Range Status   MRSA by PCR Next Gen NOT DETECTED NOT DETECTED Final    Comment: (NOTE) The GeneXpert MRSA Assay (FDA approved for NASAL specimens only), is one component of a comprehensive MRSA colonization surveillance program. It is not intended to diagnose MRSA infection nor to guide or monitor treatment for MRSA infections. Test performance is not FDA approved in patients less than 75 years old. Performed at [facility], [address], [place], [place] [address]   Aerobic/Anaerobic Culture w Gram Stain (surgical/deep wound)     Status: None   Collection Time: 10/31/22  1:24 PM   Specimen: Toe, Left; Amputation  Result Value Ref Range Status   Specimen Description WOUND LEFT TOE  Final   Special Requests 5TH METATARSAL HEAD PT ON VANC CEFEPIME  Final   Gram Stain   Final    NO WBC SEEN RARE GRAM POSITIVE COCCI IN PAIRS RARE GRAM NEGATIVE RODS    Culture   Final    FEW METHICILLIN RESISTANT STAPHYLOCOCCUS AUREUS NO ANAEROBES ISOLATED Performed at [facility], [address], [place], [place] [address]    Report Status  11/05/2022 FINAL  Final   Organism ID, Bacteria METHICILLIN RESISTANT STAPHYLOCOCCUS AUREUS  Final      Susceptibility   Methicillin resistant staphylococcus aureus - MIC*    CIPROFLOXACIN >=8 RESISTANT Resistant     ERYTHROMYCIN >=8 RESISTANT Resistant     GENTAMICIN <=0.5 SENSITIVE Sensitive     OXACILLIN >=4 RESISTANT Resistant     TETRACYCLINE <=1 SENSITIVE Sensitive     VANCOMYCIN <=0.5 SENSITIVE Sensitive     TRIMETH/SULFA 160 RESISTANT Resistant     CLINDAMYCIN >=8 RESISTANT Resistant  RIFAMPIN <=0.5 SENSITIVE Sensitive     Inducible Clindamycin NEGATIVE Sensitive     LINEZOLID 2 SENSITIVE Sensitive     * FEW METHICILLIN RESISTANT STAPHYLOCOCCUS AUREUS  Surgical pcr screen     Status: None   Collection Time: 11/03/22  7:07 AM   Specimen: Nasal Mucosa; Nasal Swab  Result Value Ref Range Status   MRSA, PCR NEGATIVE NEGATIVE Final   Staphylococcus aureus NEGATIVE NEGATIVE Final    Comment: (NOTE) The Xpert SA Assay (FDA approved for NASAL specimens in patients 35 years of age and older), is one component of a comprehensive surveillance program. It is not intended to diagnose infection nor to guide or monitor treatment. Performed at [facility], [address], [place], [place] [address]   Culture, blood (x 2)     Status: None (Preliminary result)   Collection Time: 11/04/22  2:50 PM   Specimen: BLOOD  Result Value Ref Range Status   Specimen Description BLOOD SITE NOT SPECIFIED  Final   Special Requests   Final    BOTTLES DRAWN AEROBIC AND ANAEROBIC Blood Culture results may not be optimal due to an inadequate volume of blood received in culture bottles   Culture   Final    NO GROWTH < 24 HOURS Performed at [facility], [address], [place], [place] [address]    Report Status PENDING  Incomplete  Culture, blood (x 2)     Status: None (Preliminary result)   Collection Time: 11/04/22  2:54 PM   Specimen: BLOOD  Result Value Ref Range Status    Specimen Description BLOOD BLOOD RIGHT HAND  Final   Special Requests   Final    BOTTLES DRAWN AEROBIC AND ANAEROBIC Blood Culture results may not be optimal due to an inadequate volume of blood received in culture bottles   Culture   Final    NO GROWTH < 24 HOURS Performed at [facility], [address] [place], [place] [address]    Report Status PENDING  Incomplete    Radiology Studies: DG Chest Port 1 View  Result Date: 11/04/2022 CLINICAL DATA:  Sepsis EXAM: PORTABLE CHEST 1 VIEW COMPARISON:  04/05/2022 FINDINGS: The heart size and mediastinal contours are within normal limits. Prior sternotomy and CABG. Both lungs are clear. The visualized skeletal structures are unremarkable. IMPRESSION: No active disease. Electronically Signed   By: Duanne Guess D.O.   On: 11/04/2022 14:57    Scheduled Meds:  [START ON 11/11/2022] doxycycline  100 mg Oral Q12H   fentaNYL       gemfibrozil  600 mg Oral BID AC   insulin aspart  0-5 Units Subcutaneous QHS   insulin aspart  0-9 Units Subcutaneous TID WC   melatonin  10 mg Oral QHS   nicotine  14 mg Transdermal Daily   oxyCODONE       [START ON 11/08/2022] pantoprazole  40 mg Intravenous Q12H   sodium chloride flush  3 mL Intravenous Q12H   Continuous Infusions:  sodium chloride 100 mL/hr at 11/05/22 1321   sodium chloride     pantoprazole 8 mg/hr (11/05/22 1531)    LOS: 6 days   Marguerita Merles, DO [facility] Available via Epic secure chat 7am-7pm After these hours, please refer to coverage provider listed on [email or web address] 11/05/2022, 3:46 PM

## 2022-11-06 ENCOUNTER — Encounter (HOSPITAL_COMMUNITY): Payer: Self-pay | Admitting: Vascular Surgery

## 2022-11-06 DIAGNOSIS — K253 Acute gastric ulcer without hemorrhage or perforation: Secondary | ICD-10-CM | POA: Diagnosis not present

## 2022-11-06 DIAGNOSIS — A4902 Methicillin resistant Staphylococcus aureus infection, unspecified site: Secondary | ICD-10-CM | POA: Diagnosis not present

## 2022-11-06 DIAGNOSIS — K208 Other esophagitis without bleeding: Secondary | ICD-10-CM

## 2022-11-06 DIAGNOSIS — K920 Hematemesis: Secondary | ICD-10-CM | POA: Diagnosis not present

## 2022-11-06 DIAGNOSIS — D649 Anemia, unspecified: Secondary | ICD-10-CM

## 2022-11-06 DIAGNOSIS — K269 Duodenal ulcer, unspecified as acute or chronic, without hemorrhage or perforation: Secondary | ICD-10-CM

## 2022-11-06 DIAGNOSIS — K921 Melena: Secondary | ICD-10-CM | POA: Diagnosis not present

## 2022-11-06 DIAGNOSIS — L02612 Cutaneous abscess of left foot: Secondary | ICD-10-CM | POA: Diagnosis not present

## 2022-11-06 DIAGNOSIS — K259 Gastric ulcer, unspecified as acute or chronic, without hemorrhage or perforation: Secondary | ICD-10-CM

## 2022-11-06 DIAGNOSIS — M86172 Other acute osteomyelitis, left ankle and foot: Secondary | ICD-10-CM | POA: Diagnosis not present

## 2022-11-06 LAB — CBC WITH DIFFERENTIAL/PLATELET
Abs Immature Granulocytes: 0.47 10*3/uL — ABNORMAL HIGH (ref 0.00–0.07)
Basophils Absolute: 0.1 10*3/uL (ref 0.0–0.1)
Basophils Relative: 0 %
Eosinophils Absolute: 0.2 10*3/uL (ref 0.0–0.5)
Eosinophils Relative: 2 %
HCT: 21.2 % — ABNORMAL LOW (ref 39.0–52.0)
Hemoglobin: 7.1 g/dL — ABNORMAL LOW (ref 13.0–17.0)
Immature Granulocytes: 4 %
Lymphocytes Relative: 13 %
Lymphs Abs: 1.5 10*3/uL (ref 0.7–4.0)
MCH: 28.5 pg (ref 26.0–34.0)
MCHC: 33.5 g/dL (ref 30.0–36.0)
MCV: 85.1 fL (ref 80.0–100.0)
Monocytes Absolute: 0.7 10*3/uL (ref 0.1–1.0)
Monocytes Relative: 6 %
Neutro Abs: 8.5 10*3/uL — ABNORMAL HIGH (ref 1.7–7.7)
Neutrophils Relative %: 75 %
Platelets: 225 10*3/uL (ref 150–400)
RBC: 2.49 MIL/uL — ABNORMAL LOW (ref 4.22–5.81)
RDW: 14.1 % (ref 11.5–15.5)
WBC: 11.4 10*3/uL — ABNORMAL HIGH (ref 4.0–10.5)
nRBC: 0 % (ref 0.0–0.2)

## 2022-11-06 LAB — BPAM RBC
ISSUE DATE / TIME: 202406152203
ISSUE DATE / TIME: 202406160119

## 2022-11-06 LAB — TYPE AND SCREEN
Unit division: 0
Unit division: 0

## 2022-11-06 LAB — COMPREHENSIVE METABOLIC PANEL
ALT: 27 U/L (ref 0–44)
AST: 30 U/L (ref 15–41)
Albumin: 2 g/dL — ABNORMAL LOW (ref 3.5–5.0)
Alkaline Phosphatase: 59 U/L (ref 38–126)
Anion gap: 10 (ref 5–15)
BUN: 40 mg/dL — ABNORMAL HIGH (ref 6–20)
CO2: 23 mmol/L (ref 22–32)
Calcium: 8.2 mg/dL — ABNORMAL LOW (ref 8.9–10.3)
Chloride: 99 mmol/L (ref 98–111)
Creatinine, Ser: 1.29 mg/dL — ABNORMAL HIGH (ref 0.61–1.24)
GFR, Estimated: >60 mL/min (ref >60)
Glucose, Bld: 294 mg/dL — ABNORMAL HIGH (ref 70–99)
Potassium: 4.2 mmol/L (ref 3.5–5.1)
Sodium: 132 mmol/L — ABNORMAL LOW (ref 135–145)
Total Bilirubin: 0.4 mg/dL (ref 0.3–1.2)
Total Protein: 5.2 g/dL — ABNORMAL LOW (ref 6.5–8.1)

## 2022-11-06 LAB — GLUCOSE, CAPILLARY
Glucose-Capillary: 209 mg/dL — ABNORMAL HIGH (ref 70–99)
Glucose-Capillary: 209 mg/dL — ABNORMAL HIGH (ref 70–99)
Glucose-Capillary: 201 mg/dL — ABNORMAL HIGH (ref 70–99)
Glucose-Capillary: 288 mg/dL — ABNORMAL HIGH (ref 70–99)

## 2022-11-06 LAB — PHOSPHORUS: Phosphorus: 3.5 mg/dL (ref 2.5–4.6)

## 2022-11-06 LAB — HEMOGLOBIN AND HEMATOCRIT, BLOOD
HCT: 24.9 % — ABNORMAL LOW (ref 39.0–52.0)
HCT: 26.1 % — ABNORMAL LOW (ref 39.0–52.0)
Hemoglobin: 7.7 g/dL — ABNORMAL LOW (ref 13.0–17.0)
Hemoglobin: 8.9 g/dL — ABNORMAL LOW (ref 13.0–17.0)

## 2022-11-06 LAB — PREPARE RBC (CROSSMATCH)

## 2022-11-06 LAB — MAGNESIUM: Magnesium: 1.7 mg/dL (ref 1.7–2.4)

## 2022-11-06 LAB — CULTURE, BLOOD (ROUTINE X 2): Culture: NO GROWTH

## 2022-11-06 MED ORDER — HYDROMORPHONE HCL 1 MG/ML IJ SOLN
1.0000 mg | Freq: Once | INTRAMUSCULAR | Status: AC
  Administered 2022-11-06: 1 mg via INTRAVENOUS
  Filled 2022-11-06: qty 1

## 2022-11-06 MED ORDER — EZETIMIBE 10 MG PO TABS
10.0000 mg | ORAL_TABLET | Freq: Every day | ORAL | Status: DC
  Administered 2022-11-06 – 2022-11-17 (×11): 10 mg via ORAL
  Filled 2022-11-06 (×11): qty 1

## 2022-11-06 MED ORDER — ASPIRIN 81 MG PO TBEC
81.0000 mg | DELAYED_RELEASE_TABLET | Freq: Every day | ORAL | Status: DC
  Administered 2022-11-06 – 2022-11-17 (×11): 81 mg via ORAL
  Filled 2022-11-06 (×11): qty 1

## 2022-11-06 MED ORDER — MAGNESIUM SULFATE 2 GM/50ML IV SOLN
2.0000 g | Freq: Once | INTRAVENOUS | Status: AC
  Administered 2022-11-06: 2 g via INTRAVENOUS
  Filled 2022-11-06: qty 50

## 2022-11-06 MED ORDER — SODIUM CHLORIDE 0.9% IV SOLUTION: Freq: Once | INTRAVENOUS | Status: AC

## 2022-11-06 MED ORDER — INSULIN GLARGINE-YFGN 100 UNIT/ML ~~LOC~~ SOLN
10.0000 [IU] | Freq: Every day | SUBCUTANEOUS | Status: DC
  Administered 2022-11-06 – 2022-11-09 (×3): 10 [IU] via SUBCUTANEOUS
  Filled 2022-11-06 (×4): qty 0.1

## 2022-11-06 NOTE — Progress Notes (Signed)
PROGRESS NOTE    Kenneth Bailey  UJW:[id number] DOB: December 18, 1965 DOA: 10/30/2022 PCP: [name], MD   Brief Narrative:  The patient is a 57 year old chronically ill-appearing Caucasian male with a past medical history significant for but not limited to uncontrolled diabetes mellitus type 2, CAD status post CABG, chronic combined systolic and diastolic CHF, tobacco abuse as well as other comorbidities who presented to [facility] emergency department at the recommendation of his podiatrist given his persistent left foot pain and concern for osteomyelitis of the left fifth toe.  He has been suffering with a left foot diabetic ulcer for approximately 4 weeks and will prescribe a course of antibiotics through the hospital hospital ED and was referred to podiatry who did follow him for approximate 1 week.  Imaging was done and the podiatrist evaluated and there is finding concerns for osteomyelitis.  Patient was contacted and instructed to go to [name] ED.  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    He was taken to the OR and is status post left foot incision and drainage with resection of fifth metatarsal head on 6/11 with cultures currently growing MRSA.  ID has been consulted for further evaluation recommendations. Given proximal incision necrosis, Podiatry recommends Vascular Consult for evaluation for wound healing and Vascular consulted and recommending obtaining an angiogram of the left lower leg with possible intervention through transfemoral access given that they suspect that he has tibial disease.  Patient was taken to the Cath Lab 11/03/22 and he underwent a left posterior tibial artery and tibioperoneal trunk angioplasty and was placed on aspirin and Plavix but no statin given his  allergy.  Vascular surgery feels that he is optimized from their standpoint PT to reevaluate and treat.  Today he was nauseous and vomiting and then syncopized and had hematemesis as well as melanotic stools.  His aspirin Plavix was discontinued and he was placed on a PPI drip, given 1 L boluses and a stat CBC was done which showed that his blood count dropped so he is getting 2 units of PRBCs.  GI was consulted and patient was taken to the EGD found to have LA grade D reflux esophagitis as well as nonbleeding gastric ulcers with a clean base and a nonbleeding duodenal ulcer with a flat pigmented spot biopsies taken.  He is placed on PPI therapy and blood count dropped again a little bit so we will type and screen transfuse 1 more unit.  GI is now signed off the case going to monitor and if hemoglobin remains stable anticipate discharge in next 24 to 48 hours.  Patient had significant pain today in his foot and will need to continue to be monitored.  Assessment and Plan:  Left Fifth Toe and Lateral Foot Cellulitis with Osteomyelitis -Continued empiric Antibiotics with IV Cefepime q grams q12h and IV Vancomycin; Infectious disease has now stopped the IV cefepime and recommending giving the patient Oritavancin (11/04/22) as the long acting and then have patient do additional 14 day course of Doxycycline 100mg  po bid to start on next Saturday on 11/11/22.  -ABIs done and showed "Right: Resting right  ankle-brachial index indicates moderate right lower  extremity arterial disease. The right toe-brachial index is abnormal. 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 as well as phlegmon/early abscess formation  -WBC Trend: Recent Labs  Lab 11/01/22 0059 11/02/22 0332 11/03/22 0145 11/04/22 0123  11/04/22 1450 11/04/22 1730 11/05/22 0547 11/06/22 0153  WBC 16.5* 13.2* 14.0* 12.9* 17.5*  --  13.0* 11.4*  LATICACIDVEN  --   --   --   --  1.8 1.3  --   --   -Patient's leukocytosis has now worsened in the setting of GI bleeding but is now trending down -The patient is s/p left foot I&D with resection of fifth metatarsal head 6/11 in OR -Blood Cx x2 showing NGTD at 5 Days and now Blood Cx being repeated today per the rapid nurse -Wound Cx obtained and showing: Gram Stain NO WBC SEEN RARE GRAM POSITIVE COCCI IN PAIRS RARE GRAM NEGATIVE RODS  Culture FEW METHICILLIN RESISTANT STAPHYLOCOCCUS AUREUS NO ANAEROBES ISOLATED Performed at [facility], [address] [place], [place] [address]  Report Status 11/05/2022 FINAL  Organism ID, Bacteria METHICILLIN RESISTANT STAPHYLOCOCCUS AUREUS  Resulting Agency [facility]     Susceptibility   Methicillin resistant staphylococcus aureus    MIC    CIPROFLOXACIN >=8 RESISTANT Resistant    CLINDAMYCIN >=8 RESISTANT Resistant    ERYTHROMYCIN >=8 RESISTANT Resistant    GENTAMICIN <=0.5 SENSI... Sensitive    Inducible Clindamycin NEGATIVE Sensitive    LINEZOLID 2 SENSITIVE Sensitive    OXACILLIN >=4 RESISTANT Resistant    RIFAMPIN <=0.5 SENSI... Sensitive    TETRACYCLINE <=1 SENSITIVE Sensitive    TRIMETH/SULFA 160 RESISTANT Resistant    VANCOMYCIN <=0.5 SENSI... Sensitive         -Continue with po Oxycodone 5 mg po q6hprn Moderate/Breakthrough Pain and IV Hydromorphone 0.5 mg IV q4hprn Severe Pain  -Consulted ID for further evaluation and recommendations given his Infection and Resection however podiatry feels that there is surgical cure of his bone infection; ID now recommends Oritavancin 1200 mg today and changing to Oral Doxycycline at Discharge starting 11/11/22 -Podiatry is recommending heel weightbearing in the postoperative shoe -PT OT consulted and recommending Home Health -He had some necrosis of the proximal incision so  the podiatrist had recommended obtaining a vascular consultation given his concern for wound healing and vascular Dr. Chestine Spore been consulted -Vascular did an Abdominal aortogram with left lower extremity runoff  and he had a left posterior tibial artery and tibioperoneal trunk angioplasty and was placed on Plavix -Vascular feels that the patient has inline flow down the left lower extremity to the posterior tibial artery retrograde after his and feels that he is optimized from a vascular standpoint and recommending Aspirin Plavix however this is now been stopped given his GI bleeding,: GI recommends that he can be resumed on 11/06/2022 and will resume aspirin today and Plavix tomorrow   Acute Kidney Injury, improved Metabolic Acidosis -Likely prerenal in setting of infection with poor intake -Baseline creatinine approximately 1.3 with creatinine 2.9 at presentation  -BUN/Cr Trend: Recent Labs  Lab 11/01/22 0059 11/02/22 0332 11/03/22 0145 11/04/22 0123 11/04/22 1450 11/05/22 0547 11/06/22 0153  BUN 53* 37* 34* 39* 59* 58* 40*  CREATININE 1.47* 1.09 1.11 1.03 1.12 1.08 1.29*  -He has a slight metabolic acidosis with a CO2 of 21, anion gap of 16 and chloride level of 98 -IVF now stopped -Avoid Nephrotoxic Medications, Contrast Dyes, Hypotension and Dehydration to Ensure Adequate Renal Perfusion  and will need to Renally Adjust Meds -Continue to Monitor and Trend Renal Function carefully and repeat CMP in the AM   Chronic Combined Systolic and Diastolic CHF -Euvolemic/somewhat dehydrated at presentation -follow with gentle volume expansion  Intake/Output Summary (Last 24 hours) at 11/06/2022 1748 Last data filed at 11/06/2022 1726 Gross per 24 hour  Intake 812 ml  Output 1400 ml  Net -588 ml  -Strict I's and O's and Daily Weights -Given a 1 Liter Bolus of NS at 500 mL/hr and will get 2 units of pRBCs -Continue to Monitor for Signs and Symptoms of Volume Overload now that he is getting blood  again  Syncopal Episode in the setting of ABLA from Ulcers -Setting of GI bleeding and patient passed out after coming back from the bathroom and was lowered to the floor by nursing staff -Was placed on a Protonix drip and getting 2 units of blood and received a 1 L bolus -Continue with progressive care monitoring and will need PT OT to further evaluate and treat now that he is more stable  Nausea and Vomiting, improved -In the setting of Abx and now GI Bleeding -Start IV Zofran and IV Compazine   Tobacco Abuse -Has been counseled on the absolute need to discontinue Tobacco completely -Continue with nicotine patch 14 mg transdermal every 24 hours  HLD -C/w Gemfibrozil 600 mg po BID  -Repeat panel done and showed a total cholesterol/HDL ratio 7.0, cholesterol level 140, HDL 21, LDL 76, triglycerides 253, VLDL 51   CAD status post CABG -Asymptomatic at present -Holding aspirin 81 mg p.o. daily as well as now clopidogrel 75 mg p.o. daily given GI bleeding the day before yesterday and is found to have gastric ulcers and duodenal ulcer as well as LA grade D reflux esophagitis with no bleeding -Not on a Statin due to Adverse Effects/Allergies    Uncontrolled DM2 -CBG controlled as an inpatient  -Recent A1c was 10.7 -C/w Diabetic education -Added 10 units of Semglee daily -Continue to Monitor CBGs per protocol; CBG Trend: Recent Labs  Lab 11/05/22 0608 11/05/22 1113 11/05/22 1635 11/05/22 2101 11/06/22 0552 11/06/22 1209 11/06/22 1613  GLUCAP 197* 159* 205* 256* 209* 209* 201*   Normocytic Anemia and now ABLA in the setting of upper GI bleed with LA grade D reflux esophagitis and nonbleeding gastric ulcers with a clean base and a nonbleeding duodenal ulcer with a flat pigmented spot with Hematemesis and Melena -Hgb/Hct Trend: Recent Labs  Lab 11/04/22 1450 11/04/22 1943 11/05/22 0547 11/05/22 1420 11/05/22 1946 11/06/22 0153 11/06/22 0834  HGB 7.0* 7.1* 9.0* 8.2* 7.4*  7.1* 7.7*  HCT 21.0* 21.9* 25.7* 25.2* 23.1* 21.2* 24.9*  MCV 84.7  --  82.4  --   --  85.1  --   -Checked Anemia Panel and showed an iron level of 17, UIBC of 299, TIBC 360, saturation ratios 5%, ferritin 193, folate 9.5, vitamin B12 165 -Initiated on iron supplementation but this will not be stopped given his dark emesis and melanotic stools -Start IV PPI and type and screen and transfuse 2 units of PRBCs given that hemoglobin has now dropped to 7 -Obtain gastric occult testing and FOBT testing; FOBT + -Given 1 L bolus of normal saline earlier and he was typed and screened and transfused 2 units of PRBCs but will give him additional 1 unit given that his hemoglobin dropped to 7.1 -Held his aspirin and Plavix but GI recommending that he can be resumed 11/06/2022 of Eliquis resumed  aspirin today -Continue to Monitor for S/Sx of Bleeding; No overt bleeding noted now -Repeat CBC in the AM; GI signed off the case and recommending calling back and there is any questions or concerns and recommending continuing PPI twice daily.  Patient has no further emesis or abdominal discomfort  GERD/GI prophylaxis/gastric ulcer and duodenal ulcer -On PPI drip and likely will need twice daily PPI -Gastroenterology following  Hypoalbuminemia -Patient's Albumin Trend: Recent Labs  Lab 11/02/22 0332 11/03/22 0145 11/04/22 0123 11/04/22 1450 11/05/22 0547 11/06/22 0153  ALBUMIN 2.9* 3.1* 2.4* 2.4* 2.2* 2.0*  -Continue to Monitor and Trend and repeat CMP in the AM   DVT prophylaxis: SCDs Start: 10/30/22 2211    Code Status: Full Code Family Communication: No family present at bedside  Disposition Plan:  Level of care: Progressive Cardiac Status is: Inpatient Remains inpatient appropriate because: Pending discharging home in next 24 to 48 hours if hemoglobin remained stable given that he is now been cleared by the GI team and vascular team as well as podiatry   Consultants:   Podiatry Gastroenterology Vascular Surgery  Procedures:  As delineated as above   POD #5 s/p fifth metatarsal head resection and irrigation and debridement    Procedure Performed: 1.  Ultrasound-guided access right common femoral artery 2.  Aortogram with catheter selection of aorta 3.  Left lower extremity arteriogram with selection of third order branches including the left below-knee popliteal artery 4.  Ultrasound-guided access left posterior tibial artery at the ankle retrograde with pedal sheath placement 5.  Left posterior tibial artery and tibioperoneal trunk angioplasty (3 mm x 220 mm Sterling) 6.  Mynx closure of the right common femoral artery 7.  38 minutes of monitored moderate conscious sedation time   Findings:      LA Grade D (one or more mucosal breaks involving at least 75% of       esophageal circumference) esophagitis with no bleeding was found in the       lower third of the esophagus.      Few non-bleeding superficial gastric ulcers with a clean ulcer base       (Forrest Class III) were found in the gastric antrum. The largest lesion       was 5 mm in largest dimension. Biopsies were taken with a cold forceps       for Helicobacter pylori testing.      One non-bleeding superficial duodenal ulcer with a flat pigmented spot       (Forrest Class IIc) was found in the second portion of the duodenum. The       lesion was 12 mm in largest dimension.      A superficial ulcer with a hemocystic spot was noted in the distal       portion of the bulb/proximal D2. It was not bleeding. Impression:               - LA Grade D reflux esophagitis with no bleeding.                           - Non-bleeding gastric ulcers with a clean ulcer                            base (Forrest Class III). Biopsied.                           -  Non-bleeding duodenal ulcer with a flat pigmented                            spot (Forrest Class IIc). Recommendation:           - Return patient to  hospital ward for ongoing care.                           - Resume regular diet.                           - Continue present medications.                           - Resume ASA and Plavix tomorrow as he is s/p                            stenting.                           - [facility] to assume care in the AM.  Antimicrobials:  Anti-infectives (From admission, onward)    Start     Dose/Rate Route Frequency Ordered Stop   11/11/22 1000  doxycycline (VIBRA-TABS) tablet 100 mg        100 mg Oral Every 12 hours 11/03/22 1616 11/25/22 0959   11/04/22 0800  Oritavancin Diphosphate (ORBACTIV) 1,200 mg in dextrose 5 % IVPB        1,200 mg 333.3 mL/hr over 180 Minutes Intravenous Once 11/03/22 1611 11/04/22 1158   11/03/22 0500  vancomycin (VANCOREADY) IVPB 1500 mg/300 mL  Status:  Discontinued        1,500 mg 150 mL/hr over 120 Minutes Intravenous Every 24 hours 11/02/22 1439 11/03/22 1611   11/02/22 1445  ceFEPIme (MAXIPIME) 2 g in sodium chloride 0.9 % 100 mL IVPB  Status:  Discontinued        2 g 200 mL/hr over 30 Minutes Intravenous Every 8 hours 11/02/22 1437 11/03/22 1430   11/02/22 0500  vancomycin (VANCOREADY) IVPB 1250 mg/250 mL  Status:  Discontinued        1,250 mg 166.7 mL/hr over 90 Minutes Intravenous Every 24 hours 11/01/22 0721 11/02/22 1439   11/01/22 0430  vancomycin (VANCOCIN) IVPB 1000 mg/200 mL premix        1,000 mg 200 mL/hr over 60 Minutes Intravenous  Once 11/01/22 0339 11/01/22 0620   11/01/22 0430  ceFEPIme (MAXIPIME) 2 g in sodium chloride 0.9 % 100 mL IVPB  Status:  Discontinued        2 g 200 mL/hr over 30 Minutes Intravenous Every 12 hours 11/01/22 0342 11/02/22 1437   11/01/22 0400  ceFEPIme (MAXIPIME) 2 g in sodium chloride 0.9 % 100 mL IVPB  Status:  Discontinued        2 g 200 mL/hr over 30 Minutes Intravenous Every 24 hours 10/31/22 0342 11/01/22 0341   10/31/22 0430  ceFEPIme (MAXIPIME) 2 g in sodium chloride 0.9 % 100 mL IVPB        2 g 200 mL/hr  over 30 Minutes Intravenous  Once 10/31/22 0342 10/31/22 0434   10/30/22 2138  vancomycin variable dose per unstable renal function (pharmacist dosing)  Status:  Discontinued  Does not apply See admin instructions 10/30/22 2138 11/01/22 0721   10/30/22 1745  vancomycin (VANCOREADY) IVPB 1500 mg/300 mL        1,500 mg 150 mL/hr over 120 Minutes Intravenous  Once 10/30/22 1743 10/30/22 2058       Subjective: Seen and examined at bedside who is complain about significant pain in his left foot and wanted his dressing off.  No nausea or vomiting.  Having some spasms in his foot.  Denies any chest pain or shortness of breath.  No other concerns or complaints at this time close main complaint was significant pain in his foot but had no melena or other GI bleeding noted.  Objective: Vitals:   11/06/22 1250 11/06/22 1311 11/06/22 1541 11/06/22 1600  BP: 128/60 (!) 123/54 (!) 140/72 134/70  Pulse: 89 76    Resp: 18 18    Temp: 98 F (36.7 C) 98.3 F (36.8 C) 98.4 F (36.9 C)   TempSrc: Oral Oral Oral   SpO2: 96%  99%   Weight:      Height:        Intake/Output Summary (Last 24 hours) at 11/06/2022 1753 Last data filed at 11/06/2022 1726 Gross per 24 hour  Intake 812 ml  Output 1400 ml  Net -588 ml   Filed Weights   11/04/22 0345 11/05/22 0606 11/06/22 0500  Weight: 72.6 kg 72.6 kg 72.7 kg   Examination: Physical Exam:  Constitutional: WN/WD thin Caucasian male who is complaining of foot pain today and is anxious Respiratory: Diminished to auscultation bilaterally, no wheezing, rales, rhonchi or crackles. Normal respiratory effort and patient is not tachypenic. No accessory muscle use.  Unlabored breathing Cardiovascular: Slightly tachycardic rate but regular rhythm, no murmurs / rubs / gallops. S1 and S2 auscultated.  Left foot was wrapped and I unwrapped it and had some surgical sutures noted and some swelling Abdomen: Soft, non-tender, distended secondary to body habitus.  Bowel sounds positive.  GU: Deferred. Musculoskeletal: No clubbing / cyanosis of digits/nails. No joint deformity upper and lower extremities.  Skin: No rashes, lesions, ulcers on limited skin evaluation. No induration; Warm and dry.  Neurologic: CN 2-12 grossly intact with no focal deficits. Romberg sign and cerebellar reflexes not assessed.  Psychiatric: Normal judgment and insight. Alert and oriented x 3. Anxious mood and appropriate affect.   Data Reviewed: I have personally reviewed following labs and imaging studies  CBC: Recent Labs  Lab 11/03/22 0145 11/04/22 0123 11/04/22 1450 11/04/22 1943 11/05/22 0547 11/05/22 1420 11/05/22 1946 11/06/22 0153 11/06/22 0834  WBC 14.0* 12.9* 17.5*  --  13.0*  --   --  11.4*  --   NEUTROABS 11.8* 10.6* 14.9*  --  9.7*  --   --  8.5*  --   HGB 11.5* 9.7* 7.0*   < > 9.0* 8.2* 7.4* 7.1* 7.7*  HCT 35.3* 29.3* 21.0*   < > 25.7* 25.2* 23.1* 21.2* 24.9*  MCV 84.9 81.6 84.7  --  82.4  --   --  85.1  --   PLT 326 305 374  --  248  --   --  225  --    < > = values in this interval not displayed.   Basic Metabolic Panel: Recent Labs  Lab 11/02/22 0332 11/03/22 0145 11/04/22 0123 11/04/22 1450 11/05/22 0547 11/06/22 0153  NA 133* 133* 133* 132* 135 132*  K 4.8 4.8 4.1 4.5 4.1 4.2  CL 97* 97* 97* 97* 98 99  CO2  24 21* 24 23 21* 23  GLUCOSE 153* 176* 211* 325* 215* 294*  BUN 37* 34* 39* 59* 58* 40*  CREATININE 1.09 1.11 1.03 1.12 1.08 1.29*  CALCIUM 9.6 9.9 9.1 9.2 8.5* 8.2*  MG 1.8 1.7 1.5*  --  1.7 1.7  PHOS 3.0 4.0 3.7  --  3.2 3.5   GFR: Estimated Creatinine Clearance: 62.6 mL/min (A) (by C-G formula based on SCr of 1.29 mg/dL (H)). Liver Function Tests: Recent Labs  Lab 11/03/22 0145 11/04/22 0123 11/04/22 1450 11/05/22 0547 11/06/22 0153  AST 13* 15 15 11* 30  ALT 12 15 15 11 27   ALKPHOS 76 66 63 50 59  BILITOT 0.7 0.7 0.4 0.5 0.4  PROT 7.5 6.6 6.4* 5.5* 5.2*  ALBUMIN 3.1* 2.4* 2.4* 2.2* 2.0*   No results for  input(s): "LIPASE", "AMYLASE" in the last 168 hours. No results for input(s): "AMMONIA" in the last 168 hours. Coagulation Profile: Recent Labs  Lab 11/04/22 1450  INR 1.2   Cardiac Enzymes: No results for input(s): "CKTOTAL", "CKMB", "CKMBINDEX", "TROPONINI" in the last 168 hours. BNP (last 3 results) No results for input(s): "PROBNP" in the last 8760 hours. HbA1C: No results for input(s): "HGBA1C" in the last 72 hours. CBG: Recent Labs  Lab 11/05/22 1635 11/05/22 2101 11/06/22 0552 11/06/22 1209 11/06/22 1613  GLUCAP 205* 256* 209* 209* 201*   Lipid Profile: Recent Labs    11/04/22 0123  CHOL 148  HDL 21*  LDLCALC 76  TRIG 161*  CHOLHDL 7.0   Thyroid Function Tests: No results for input(s): "TSH", "T4TOTAL", "FREET4", "T3FREE", "THYROIDAB" in the last 72 hours. Anemia Panel: No results for input(s): "VITAMINB12", "FOLATE", "FERRITIN", "TIBC", "IRON", "RETICCTPCT" in the last 72 hours. Sepsis Labs: Recent Labs  Lab 10/30/22 2005 11/04/22 1450 11/04/22 1730  LATICACIDVEN 1.2 1.8 1.3    Recent Results (from the past 240 hour(s))  Blood culture (routine x 2)     Status: None   Collection Time: 10/30/22  4:58 PM   Specimen: BLOOD  Result Value Ref Range Status   Specimen Description BLOOD LEFT FOREARM  Final   Special Requests   Final    BOTTLES DRAWN AEROBIC AND ANAEROBIC Blood Culture adequate volume   Culture   Final    NO GROWTH 5 DAYS Performed at [facility], [address], [place], [place] [address]    Report Status 11/04/2022 FINAL  Final  Blood culture (routine x 2)     Status: None   Collection Time: 10/30/22  5:11 PM   Specimen: BLOOD  Result Value Ref Range Status   Specimen Description BLOOD RIGHT ANTECUBITAL  Final   Special Requests   Final    BOTTLES DRAWN AEROBIC AND ANAEROBIC Blood Culture results may not be optimal due to an excessive volume of blood received in culture bottles   Culture   Final    NO GROWTH 5 DAYS Performed  at [facility], [address], [place], [place] [address]    Report Status 11/04/2022 FINAL  Final  MRSA Next Gen by PCR, Nasal     Status: None   Collection Time: 10/31/22  3:44 AM   Specimen: Nasal Mucosa; Nasal Swab  Result Value Ref Range Status   MRSA by PCR Next Gen NOT DETECTED NOT DETECTED Final    Comment: (NOTE) The GeneXpert MRSA Assay (FDA approved for NASAL specimens only), is one component of a comprehensive MRSA colonization surveillance program. It is not intended  to diagnose MRSA infection nor to guide or monitor treatment for MRSA infections. Test performance is not FDA approved in patients less than 71 years old. Performed at [facility], [address] [place], [place] [address]   Aerobic/Anaerobic Culture w Gram Stain (surgical/deep wound)     Status: None   Collection Time: 10/31/22  1:24 PM   Specimen: Toe, Left; Amputation  Result Value Ref Range Status   Specimen Description WOUND LEFT TOE  Final   Special Requests 5TH METATARSAL HEAD PT ON VANC CEFEPIME  Final   Gram Stain   Final    NO WBC SEEN RARE GRAM POSITIVE COCCI IN PAIRS RARE GRAM NEGATIVE RODS    Culture   Final    FEW METHICILLIN RESISTANT STAPHYLOCOCCUS AUREUS NO ANAEROBES ISOLATED Performed at [facility], [address] [place], [place] [address]    Report Status 11/05/2022 FINAL  Final   Organism ID, Bacteria METHICILLIN RESISTANT STAPHYLOCOCCUS AUREUS  Final      Susceptibility   Methicillin resistant staphylococcus aureus - MIC*    CIPROFLOXACIN >=8 RESISTANT Resistant     ERYTHROMYCIN >=8 RESISTANT Resistant     GENTAMICIN <=0.5 SENSITIVE Sensitive     OXACILLIN >=4 RESISTANT Resistant     TETRACYCLINE <=1 SENSITIVE Sensitive     VANCOMYCIN <=0.5 SENSITIVE Sensitive     TRIMETH/SULFA 160 RESISTANT Resistant     CLINDAMYCIN >=8 RESISTANT Resistant     RIFAMPIN <=0.5 SENSITIVE Sensitive     Inducible Clindamycin NEGATIVE Sensitive     LINEZOLID 2 SENSITIVE  Sensitive     * FEW METHICILLIN RESISTANT STAPHYLOCOCCUS AUREUS  Surgical pcr screen     Status: None   Collection Time: 11/03/22  7:07 AM   Specimen: Nasal Mucosa; Nasal Swab  Result Value Ref Range Status   MRSA, PCR NEGATIVE NEGATIVE Final   Staphylococcus aureus NEGATIVE NEGATIVE Final    Comment: (NOTE) The Xpert SA Assay (FDA approved for NASAL specimens in patients 73 years of age and older), is one component of a comprehensive surveillance program. It is not intended to diagnose infection nor to guide or monitor treatment. Performed at [facility], [address], [place], [place] [address]   Culture, blood (x 2)     Status: None (Preliminary result)   Collection Time: 11/04/22  2:50 PM   Specimen: BLOOD  Result Value Ref Range Status   Specimen Description BLOOD SITE NOT SPECIFIED  Final   Special Requests   Final    BOTTLES DRAWN AEROBIC AND ANAEROBIC Blood Culture results may not be optimal due to an inadequate volume of blood received in culture bottles   Culture   Final    NO GROWTH 2 DAYS Performed at [facility], [address], [place], [place] [address]    Report Status PENDING  Incomplete  Culture, blood (x 2)     Status: None (Preliminary result)   Collection Time: 11/04/22  2:54 PM   Specimen: BLOOD  Result Value Ref Range Status   Specimen Description BLOOD BLOOD RIGHT HAND  Final   Special Requests   Final    BOTTLES DRAWN AEROBIC AND ANAEROBIC Blood Culture results may not be optimal due to an inadequate volume of blood received in culture bottles   Culture   Final    NO GROWTH 2 DAYS Performed at [facility], [address], [place], [place] [address]    Report Status PENDING  Incomplete  Radiology Studies: No results found.  Scheduled Meds:  aspirin EC  81 mg Oral Daily   [START ON 11/11/2022] doxycycline  100 mg Oral Q12H   ezetimibe  10 mg Oral Daily   gemfibrozil  600 mg Oral BID AC   insulin aspart  0-5 Units  Subcutaneous QHS   insulin aspart  0-9 Units Subcutaneous TID WC   insulin glargine-yfgn  10 Units Subcutaneous Daily   melatonin  10 mg Oral QHS   nicotine  14 mg Transdermal Daily   [START ON 11/08/2022] pantoprazole  40 mg Intravenous Q12H   sodium chloride flush  3 mL Intravenous Q12H   Continuous Infusions:  sodium chloride     pantoprazole 8 mg/hr (11/06/22 1300)    LOS: 7 days   Marguerita Merles, DO [facility] Available via Epic secure chat 7am-7pm After these hours, please refer to coverage provider listed on [email or web address] 11/06/2022, 5:53 PM

## 2022-11-06 NOTE — Progress Notes (Signed)
Occupational Therapy Treatment Patient Details Name: Kenneth Bailey MRN: [id number] DOB: 08/04/1965 Today's Date: 11/06/2022   History of present illness Pt is a 57 y.o. male admitted on 10/30/2022 with osteomyelitis. He is now s/p Left foot irrigation and debridement with resection of fifth metatarsal head 11/01/22. Past medical history significant for uncontrolled type 2 diabetes, diabetic polyneuropathy, coronary artery disease status post CABG, chronic combined diastolic and systolic CHF, current smoker,   OT comments  Pt able to don R sock, declined L sock due to pain. Donned front opening gown at EOB with set up. Pt agreeable to attempt OOB, but unable to progress beyond standing due to L LE 10/10 pain and lightheadedness. BP stable.  MD made aware and plans to order blood transfusion. Pt likely to progress well once medical issues resolve and pain is managed. Pt does not have a caregiver upon discharge, states he plans to drive himself home.    Recommendations for follow up therapy are one component of a multi-disciplinary discharge planning process, led by the attending physician.  Recommendations may be updated based on patient status, additional functional criteria and insurance authorization.    Assistance Recommended at Discharge Intermittent Supervision/Assistance  Patient can return home with the following  A little help with walking and/or transfers;Assistance with cooking/housework;A little help with bathing/dressing/bathroom;Assist for transportation;Help with stairs or ramp for entrance   Equipment Recommendations  None recommended by OT    Recommendations for Other Services      Precautions / Restrictions Precautions Precautions: Fall Required Braces or Orthoses: Other Brace Other Brace: post op shoe Restrictions Weight Bearing Restrictions: Yes LLE Weight Bearing: Partial weight bearing LLE Partial Weight Bearing Percentage or Pounds: through heel Other  Position/Activity Restrictions: through heel per MD order       Mobility Bed Mobility Overal bed mobility: Needs Assistance Bed Mobility: Supine to Sit, Sit to Supine     Supine to sit: Supervision Sit to supine: Supervision   General bed mobility comments: no physical assistance needed    Transfers Overall transfer level: Needs assistance Equipment used: Rolling walker (2 wheels)   Sit to Stand: +2 safety/equipment, Min guard           General transfer comment: pt with increased pain in L LE in dependent position, became lightheaded upon standing and unable to progress OOB     Balance Overall balance assessment: Needs assistance   Sitting balance-Leahy Scale: Fair       Standing balance-Leahy Scale: Poor Standing balance comment: reliant on RW for support                           ADL either performed or assessed with clinical judgement   ADL Overall ADL's : Needs assistance/impaired                 Upper Body Dressing : Sitting;Set up Upper Body Dressing Details (indicate cue type and reason): front opening gown Lower Body Dressing: Set up;Bed level Lower Body Dressing Details (indicate cue type and reason): sock on R foot                    Extremity/Trunk Assessment              Vision       Perception     Praxis      Cognition Arousal/Alertness: Awake/alert Behavior During Therapy: Anxious, Impulsive   Area of Impairment: Problem solving, Safety/judgement  Safety/Judgement: Decreased awareness of deficits, Decreased awareness of safety   Problem Solving: Decreased initiation, Requires verbal cues          Exercises      Shoulder Instructions       General Comments      Pertinent Vitals/ Pain       Pain Assessment Pain Assessment: 0-10 Pain Score: 10-Worst pain ever Pain Location: L foot Pain Descriptors / Indicators: Throbbing, Moaning, Grimacing, Guarding Pain  Intervention(s): Repositioned, Premedicated before session, Patient requesting pain meds-RN notified  Home Living                                          Prior Functioning/Environment              Frequency  Min 2X/week        Progress Toward Goals  OT Goals(current goals can now be found in the care plan section)  Progress towards OT goals: Not progressing toward goals - comment  Acute Rehab OT Goals OT Goal Formulation: With patient Time For Goal Achievement: 11/15/22 Potential to Achieve Goals: Good  Plan Discharge plan remains appropriate    Co-evaluation                 AM-PAC OT "6 Clicks" Daily Activity     Outcome Measure   Help from another person eating meals?: None Help from another person taking care of personal grooming?: A Little Help from another person toileting, which includes using toliet, bedpan, or urinal?: A Little Help from another person bathing (including washing, rinsing, drying)?: A Little Help from another person to put on and taking off regular upper body clothing?: A Little Help from another person to put on and taking off regular lower body clothing?: A Little 6 Click Score: 19    End of Session Equipment Utilized During Treatment: Gait belt;Rolling walker (2 wheels)  OT Visit Diagnosis: Unsteadiness on feet (R26.81);Pain Pain - Right/Left: Left Pain - part of body: Ankle and joints of foot   Activity Tolerance No increased pain;Treatment limited secondary to medical complications (Comment) (lightheaded upon standing)   Patient Left in bed;with call bell/phone within reach;with bed alarm set   Nurse Communication Patient requests pain meds;Other (comment) (dizziness upon standing)        Time: 1610-9604 OT Time Calculation (min): 39 min  Charges: OT General Charges $OT Visit: 1 Visit OT Treatments $Self Care/Home Management : 8-22 mins $Therapeutic Activity: 8-22 mins  Berna Spare, OTR/L Acute  Rehabilitation Services Office: [phone]   Evern Bio 11/06/2022, 1:21 PM

## 2022-11-06 NOTE — Progress Notes (Signed)
PHARMACIST LIPID MONITORING   Kenneth Bailey is a 57 y.o. male admitted on 10/30/2022 with PVD.  Pharmacy has been consulted to optimize lipid-lowering therapy with the indication of secondary prevention for clinical ASCVD.  Recent Labs:  Lipid Panel (last 6 months):   Lab Results  Component Value Date   CHOL 148 11/04/2022   TRIG 253 (H) 11/04/2022   HDL 21 (L) 11/04/2022   CHOLHDL 7.0 11/04/2022   VLDL 51 (H) 11/04/2022   LDLCALC 76 11/04/2022   LDLDIRECT 81 09/04/2022    Hepatic function panel (last 6 months):   Lab Results  Component Value Date   AST 30 11/06/2022   ALT 27 11/06/2022   ALKPHOS 59 11/06/2022   BILITOT 0.4 11/06/2022    SCr (since admission):   Serum creatinine: 1.29 mg/dL (H) 09/81/19 1478 Estimated creatinine clearance: 62.6 mL/min (A)  Current therapy and lipid therapy tolerance Current lipid-lowering therapy: Lopid Previous lipid-lowering therapies (if applicable): atorvastatin Documented or reported allergies or intolerances to lipid-lowering therapies (if applicable): to most statins per patient  Assessment:   Patient agrees with changes to lipid-lowering therapy  Plan:    1.Statin intensity (high intensity recommended for all patients regardless of the LDL):  Statin intolerance noted. No statin changes due to serious side effects (ex. Myalgias with at least 2 different statins).  2.Add ezetimibe (if any one of the following):   Cannot tolerate statin at any dose.  3.Refer to lipid clinic:   No  4.Follow-up with:  Primary care provider - [name], MD  5.Follow-up labs after discharge:  Changes in lipid therapy were made. Check a lipid panel in 8-12 weeks then annually.      Ulyses Southward, PharmD, BCIDP, AAHIVP, CPP Infectious Disease Pharmacist 11/06/2022 9:08 AM

## 2022-11-06 NOTE — Progress Notes (Signed)
Ok to add Semglee 10 units per Dr. Marland Mcalpine.  Ulyses Southward, PharmD, [place], AAHIVP, CPP Infectious Disease Pharmacist 11/06/2022 10:49 AM  '

## 2022-11-06 NOTE — Progress Notes (Signed)
  Subjective:  Patient ID: Kenneth Bailey, male    DOB: Apr 18, 1966,  MRN: [id number]  Patient POD#5 s/p left fifth metatarsal head resection and irrigation and debridement. S/p abdominal aortogram with intervention and optimization from vascular.  He has had some chills but no fevers.  He does get pain to the foot you take the bandage off as it was too tight.  States he is not airing the foot out all day today.  Dressing just reapplied.  CBC    Component Value Date/Time   WBC 11.4 (H) 11/06/2022 0153   RBC 2.49 (L) 11/06/2022 0153   HGB 8.9 (L) 11/06/2022 1753   HCT 26.1 (L) 11/06/2022 1753   PLT 225 11/06/2022 0153   MCV 85.1 11/06/2022 0153   MCH 28.5 11/06/2022 0153   MCHC 33.5 11/06/2022 0153   RDW 14.1 11/06/2022 0153   LYMPHSABS 1.5 11/06/2022 0153   MONOABS 0.7 11/06/2022 0153   EOSABS 0.2 11/06/2022 0153   BASOSABS 0.1 11/06/2022 0153      Latest Ref Rng & Units 11/06/2022    1:53 AM 11/05/2022    5:47 AM 11/04/2022    2:50 PM  BMP  Glucose 70 - 99 mg/dL 409  811  914   BUN 6 - 20 mg/dL 40  58  59   Creatinine 0.61 - 1.24 mg/dL 7.82  9.56  2.13   Sodium 135 - 145 mmol/L 132  135  132   Potassium 3.5 - 5.1 mmol/L 4.2  4.1  4.5   Chloride 98 - 111 mmol/L 99  98  97   CO2 22 - 32 mmol/L 23  21  23    Calcium 8.9 - 10.3 mg/dL 8.2  8.5  9.2        Objective:   Vitals:   11/06/22 1600 11/06/22 2000  BP: 134/70   Pulse:    Resp:  18  Temp:    SpO2:       General:AA&O x 3. Normal mood and affect   Vascular: DP and PT faintly palpable bilateral. Brisk capillary refill to all digits. Pedal hair present   Neruological. Epicritic sensation grossly intact.   Derm: Incision well coapted, No signes of dehiscence.  There is increase necrosis noted but appears to be more of a hemorrhagic type blister.  Able to express some bloody drainage but there is no purulence.  Minimal erythema without any ascending cellulitis.  There is no fluctuation, crepitation, malodor.   There is some tenderness palpation on the surgical site.  MSK: Mild tenderness on the surgical site  This is aAssessment & Plan:  Patient was evaluated and treated and all questions answered.  DX: POD #5 s/p fifth metatarsal head resection and irrigation and debridement  -Discussed possible need for further debridement tomorrow and continue with local wound care for now.  Betadine wet-to-dry dressing applied to the area. -DME: Heel weight bearing in post-op shoe   -Cultures growing MRSA -Path: osteomyelitis  -WBC improving, will monitor  -I will plan to change the dressing tomorrow for further evaluation   Ovid Curd, DPM

## 2022-11-06 NOTE — Progress Notes (Signed)
    Progress Note   Subjective  Chief Complaint: Coffee-ground emesis  EGD 11/05/2022 with LA grade D reflux esophagitis, nonbleeding gastric ulcers with a clean ulcer base and nonbleeding duodenal ulcer with a flat pigmented spot-told to resume aspirin and Plavix today  Today, patient tells me he is doing well, no further coffee-ground emesis and eating a regular diet.  No abdominal pain.   Objective   Vital signs in last 24 hours: Temp:  [97.6 F (36.4 C)-98.3 F (36.8 C)] 97.9 F (36.6 C) (06/17 0700) Pulse Rate:  [66-82] 78 (06/17 0700) Resp:  [12-18] 18 (06/17 0327) BP: (86-137)/(47-75) 137/65 (06/17 0700) SpO2:  [91 %-100 %] 97 % (06/17 0700) Weight:  [72.7 kg] 72.7 kg (06/17 0500) Last BM Date : 11/01/22 General:    white male in NAD Heart:  Regular rate and rhythm; no murmurs Lungs: Respirations even and unlabored, lungs CTA bilaterally Abdomen:  Soft, nontender and nondistended. Normal bowel sounds. Psych:  Cooperative. Normal mood and affect.  Intake/Output from previous day: 06/16 0701 - 06/17 0700 In: 820.5 [P.O.:240; I.V.:580.5] Out: 1400 [Urine:1400]   Lab Results: Recent Labs    11/04/22 1450 11/04/22 1943 11/05/22 0547 11/05/22 1420 11/05/22 1946 11/06/22 0153 11/06/22 0834  WBC 17.5*  --  13.0*  --   --  11.4*  --   HGB 7.0*   < > 9.0*   < > 7.4* 7.1* 7.7*  HCT 21.0*   < > 25.7*   < > 23.1* 21.2* 24.9*  PLT 374  --  248  --   --  225  --    < > = values in this interval not displayed.   BMET Recent Labs    11/04/22 1450 11/05/22 0547 11/06/22 0153  NA 132* 135 132*  K 4.5 4.1 4.2  CL 97* 98 99  CO2 23 21* 23  GLUCOSE 325* 215* 294*  BUN 59* 58* 40*  CREATININE 1.12 1.08 1.29*  CALCIUM 9.2 8.5* 8.2*   LFT Recent Labs    11/06/22 0153  PROT 5.2*  ALBUMIN 2.0*  AST 30  ALT 27  ALKPHOS 59  BILITOT 0.4   PT/INR Recent Labs    11/04/22 1450  LABPROT 15.3*  INR 1.2    Studies/Results: DG Chest Port 1 View  Result Date:  11/04/2022 CLINICAL DATA:  Sepsis EXAM: PORTABLE CHEST 1 VIEW COMPARISON:  04/05/2022 FINDINGS: The heart size and mediastinal contours are within normal limits. Prior sternotomy and CABG. Both lungs are clear. The visualized skeletal structures are unremarkable. IMPRESSION: No active disease. Electronically Signed   By: Duanne Guess D.O.   On: 11/04/2022 14:57       Assessment / Plan:   Assessment: 1.  Coffee-ground emesis and anemia: Status post EGD 6/16 with gastric and duodenal ulcers, hemoglobin 7.4--> 1.7 overnight, no further bleeding per the patient 2.  Melena 3.  Anemia 4.  Critical limb ischemia status post angioplasty  Plan: 1.  Continue to monitor hemoglobin with transfusion as necessary while here 2.  Continue regular diet 3.  Patient will need repeat EGD in 4 to 6 weeks with his primary GI team who are in [place].  He will need to see them in follow-up.  We will sign off.  Please call us back if we can be of any further assistance.    LOS: 7 days   Unk Lightning  11/06/2022, 10:50 AM

## 2022-11-06 NOTE — Progress Notes (Signed)
Pts Hgb 8.9 after 1 unit  of blood, Verbal order from Dr. Marland Mcalpine to hold second unit at this time.

## 2022-11-06 NOTE — Progress Notes (Signed)
Physical Therapy Treatment Patient Details Name: Kenneth Bailey MRN: [id number] DOB: [birth year] Today's Date: 11/06/2022   History of Present Illness Pt is a 57 y.o. male admitted on 10/30/2022 with osteomyelitis. He is now s/p Left foot irrigation and debridement with resection of fifth metatarsal head 11/01/22. Past medical history significant for uncontrolled type 2 diabetes, diabetic polyneuropathy, coronary artery disease status post CABG, chronic combined diastolic and systolic CHF, current smoker,    PT Comments    Pt significantly limited secondary to pain, reporting 10/10 pain in his L foot after activity (5/10 prior to session). Pt also with reports of dizziness and light-headedness after standing; however, all VSS. Pt's MD entering room at end of session and reporting that his Hgb continues to drop with plans to transfuse. Pt would continue to benefit from skilled physical therapy services at this time while admitted and after d/c to address the below listed limitations in order to improve overall safety and independence with functional mobility.    Recommendations for follow up therapy are one component of a multi-disciplinary discharge planning process, led by the attending physician.  Recommendations may be updated based on patient status, additional functional criteria and insurance authorization.  Follow Up Recommendations       Assistance Recommended at Discharge Frequent or constant Supervision/Assistance  Patient can return home with the following A lot of help with walking and/or transfers;A little help with bathing/dressing/bathroom;Assistance with cooking/housework;Assist for transportation;Help with stairs or ramp for entrance   Equipment Recommendations  Rolling walker (2 wheels);BSC/3in1    Recommendations for Other Services       Precautions / Restrictions Precautions Precautions: Fall Required Braces or Orthoses: Other Brace Other Brace: post op  shoe Restrictions Weight Bearing Restrictions: Yes LLE Weight Bearing: Partial weight bearing Other Position/Activity Restrictions: through heel per MD order     Mobility  Bed Mobility Overal bed mobility: Needs Assistance Bed Mobility: Supine to Sit, Sit to Supine     Supine to sit: Min guard, HOB elevated Sit to supine: Supervision   General bed mobility comments: no physical assistance needed    Transfers Overall transfer level: Needs assistance Equipment used: Rolling walker (2 wheels) Transfers: Sit to/from Stand Sit to Stand: +2 safety/equipment, Min guard           General transfer comment: min guard for safety, pt impulsive and greatly limited secondary to significant pain    Ambulation/Gait               General Gait Details: pt unable to tolerate due to 10/10 pain   Stairs             Wheelchair Mobility    Modified Rankin (Stroke Patients Only)       Balance Overall balance assessment: Needs assistance Sitting-balance support: Feet supported Sitting balance-Leahy Scale: Fair     Standing balance support: Bilateral upper extremity supported, During functional activity Standing balance-Leahy Scale: Poor                              Cognition Arousal/Alertness: Awake/alert Behavior During Therapy: Impulsive Overall Cognitive Status: Impaired/Different from baseline Area of Impairment: Problem solving, Safety/judgement                         Safety/Judgement: Decreased awareness of deficits, Decreased awareness of safety   Problem Solving: Decreased initiation, Requires verbal cues  Exercises      General Comments        Pertinent Vitals/Pain Pain Assessment Pain Assessment: 0-10 Pain Score: 10-Worst pain ever Pain Location: L foot Pain Descriptors / Indicators: Grimacing, Guarding, Moaning, Pressure Pain Intervention(s): Monitored during session, Repositioned, Patient requesting pain  meds-RN notified    Home Living                          Prior Function            PT Goals (current goals can now be found in the care plan section) Acute Rehab PT Goals PT Goal Formulation: With patient Time For Goal Achievement: 11/08/22 Potential to Achieve Goals: Good Progress towards PT goals: Progressing toward goals    Frequency    Min 1X/week      PT Plan Discharge plan needs to be updated    Co-evaluation              AM-PAC PT "6 Clicks" Mobility   Outcome Measure  Help needed turning from your back to your side while in a flat bed without using bedrails?: None Help needed moving from lying on your back to sitting on the side of a flat bed without using bedrails?: None Help needed moving to and from a bed to a chair (including a wheelchair)?: A Lot Help needed standing up from a chair using your arms (e.g., wheelchair or bedside chair)?: A Little Help needed to walk in hospital room?: A Lot Help needed climbing 3-5 steps with a railing? : Total 6 Click Score: 16    End of Session Equipment Utilized During Treatment: Gait belt Activity Tolerance: Patient limited by pain Patient left: in bed;with call bell/phone within reach;with bed alarm set Nurse Communication: Mobility status;Patient requests pain meds PT Visit Diagnosis: Other abnormalities of gait and mobility (R26.89);Pain Pain - Right/Left: Left Pain - part of body: Ankle and joints of foot     Time: 1610-9604 PT Time Calculation (min) (ACUTE ONLY): 23 min  Charges:  $Therapeutic Activity: 8-22 mins                     Arletta Bale, DPT  Acute Rehabilitation Services Office [phone]    Kenneth Bailey 11/06/2022, 12:24 PM

## 2022-11-07 ENCOUNTER — Inpatient Hospital Stay (HOSPITAL_COMMUNITY): Payer: BC Managed Care – PPO

## 2022-11-07 DIAGNOSIS — A4902 Methicillin resistant Staphylococcus aureus infection, unspecified site: Secondary | ICD-10-CM | POA: Diagnosis not present

## 2022-11-07 DIAGNOSIS — K253 Acute gastric ulcer without hemorrhage or perforation: Secondary | ICD-10-CM | POA: Diagnosis not present

## 2022-11-07 DIAGNOSIS — L02612 Cutaneous abscess of left foot: Secondary | ICD-10-CM | POA: Diagnosis not present

## 2022-11-07 DIAGNOSIS — M86172 Other acute osteomyelitis, left ankle and foot: Secondary | ICD-10-CM | POA: Diagnosis not present

## 2022-11-07 DIAGNOSIS — K208 Other esophagitis without bleeding: Secondary | ICD-10-CM

## 2022-11-07 LAB — GLUCOSE, CAPILLARY
Glucose-Capillary: 181 mg/dL — ABNORMAL HIGH (ref 70–99)
Glucose-Capillary: 308 mg/dL — ABNORMAL HIGH (ref 70–99)
Glucose-Capillary: 199 mg/dL — ABNORMAL HIGH (ref 70–99)
Glucose-Capillary: 313 mg/dL — ABNORMAL HIGH (ref 70–99)

## 2022-11-07 LAB — TYPE AND SCREEN: Antibody Screen: NEGATIVE

## 2022-11-07 LAB — HEMOGLOBIN AND HEMATOCRIT, BLOOD
HCT: 25 % — ABNORMAL LOW (ref 39.0–52.0)
HCT: 29.4 % — ABNORMAL LOW (ref 39.0–52.0)
Hemoglobin: 8.6 g/dL — ABNORMAL LOW (ref 13.0–17.0)
Hemoglobin: 9.9 g/dL — ABNORMAL LOW (ref 13.0–17.0)

## 2022-11-07 LAB — COMPREHENSIVE METABOLIC PANEL
ALT: 41 U/L (ref 0–44)
AST: 36 U/L (ref 15–41)
Albumin: 2.5 g/dL — ABNORMAL LOW (ref 3.5–5.0)
Alkaline Phosphatase: 72 U/L (ref 38–126)
Anion gap: 11 (ref 5–15)
BUN: 27 mg/dL — ABNORMAL HIGH (ref 6–20)
CO2: 26 mmol/L (ref 22–32)
Calcium: 9 mg/dL (ref 8.9–10.3)
Chloride: 95 mmol/L — ABNORMAL LOW (ref 98–111)
Creatinine, Ser: 1.04 mg/dL (ref 0.61–1.24)
GFR, Estimated: >60 mL/min (ref >60)
Glucose, Bld: 177 mg/dL — ABNORMAL HIGH (ref 70–99)
Potassium: 4.4 mmol/L (ref 3.5–5.1)
Sodium: 132 mmol/L — ABNORMAL LOW (ref 135–145)
Total Bilirubin: 0.6 mg/dL (ref 0.3–1.2)
Total Protein: 6.3 g/dL — ABNORMAL LOW (ref 6.5–8.1)

## 2022-11-07 LAB — CBC WITH DIFFERENTIAL/PLATELET
Abs Immature Granulocytes: 0.96 10*3/uL — ABNORMAL HIGH (ref 0.00–0.07)
Basophils Absolute: 0.1 10*3/uL (ref 0.0–0.1)
Basophils Relative: 1 %
Eosinophils Absolute: 0.3 10*3/uL (ref 0.0–0.5)
Eosinophils Relative: 2 %
HCT: 28.2 % — ABNORMAL LOW (ref 39.0–52.0)
Hemoglobin: 9.7 g/dL — ABNORMAL LOW (ref 13.0–17.0)
Immature Granulocytes: 7 %
Lymphocytes Relative: 15 %
Lymphs Abs: 1.9 10*3/uL (ref 0.7–4.0)
MCH: 29 pg (ref 26.0–34.0)
MCHC: 34.4 g/dL (ref 30.0–36.0)
MCV: 84.4 fL (ref 80.0–100.0)
Monocytes Absolute: 0.7 10*3/uL (ref 0.1–1.0)
Monocytes Relative: 6 %
Neutro Abs: 9.2 10*3/uL — ABNORMAL HIGH (ref 1.7–7.7)
Neutrophils Relative %: 69 %
Platelets: 272 10*3/uL (ref 150–400)
RBC: 3.34 MIL/uL — ABNORMAL LOW (ref 4.22–5.81)
RDW: 14 % (ref 11.5–15.5)
WBC: 13.2 10*3/uL — ABNORMAL HIGH (ref 4.0–10.5)
nRBC: 0.2 % (ref 0.0–0.2)

## 2022-11-07 LAB — BPAM RBC
Blood Product Expiration Date: 202407092359
Blood Product Expiration Date: 202407112359
Blood Product Expiration Date: 202407162359
ISSUE DATE / TIME: 202406171243
Unit Type and Rh: 5100
Unit Type and Rh: 5100
Unit Type and Rh: 5100

## 2022-11-07 LAB — MAGNESIUM: Magnesium: 1.9 mg/dL (ref 1.7–2.4)

## 2022-11-07 LAB — PHOSPHORUS: Phosphorus: 4 mg/dL (ref 2.5–4.6)

## 2022-11-07 LAB — SURGICAL PATHOLOGY

## 2022-11-07 MED ORDER — PANTOPRAZOLE SODIUM 40 MG PO TBEC: 40.0000 mg | DELAYED_RELEASE_TABLET | Freq: Two times a day (BID) | ORAL | 1 refills | Status: DC

## 2022-11-07 MED ORDER — INSULIN GLARGINE-YFGN 100 UNIT/ML ~~LOC~~ SOLN: 10.0000 [IU] | Freq: Every day | SUBCUTANEOUS | 11 refills | Status: DC

## 2022-11-07 MED ORDER — CLOPIDOGREL BISULFATE 75 MG PO TABS
75.0000 mg | ORAL_TABLET | Freq: Every day | ORAL | Status: DC
  Administered 2022-11-07 – 2022-11-17 (×10): 75 mg via ORAL
  Filled 2022-11-07 (×10): qty 1

## 2022-11-07 MED ORDER — DOXYCYCLINE HYCLATE 100 MG PO CAPS: 100.0000 mg | ORAL_CAPSULE | Freq: Two times a day (BID) | ORAL | 0 refills | Status: DC

## 2022-11-07 MED ORDER — EZETIMIBE 10 MG PO TABS: 10.0000 mg | ORAL_TABLET | Freq: Every day | ORAL | 0 refills | Status: DC

## 2022-11-07 MED ORDER — CLOPIDOGREL BISULFATE 75 MG PO TABS: 75.0000 mg | ORAL_TABLET | Freq: Every day | ORAL | 0 refills | Status: DC

## 2022-11-07 MED ORDER — OXYCODONE HCL 5 MG PO TABS: 5.0000 mg | ORAL_TABLET | Freq: Four times a day (QID) | ORAL | 0 refills | Status: AC | PRN

## 2022-11-07 MED ORDER — ACETAMINOPHEN 325 MG PO TABS: 650.0000 mg | ORAL_TABLET | Freq: Four times a day (QID) | ORAL | 0 refills | Status: DC | PRN

## 2022-11-07 MED ORDER — NICOTINE 14 MG/24HR TD PT24: 14.0000 mg | MEDICATED_PATCH | Freq: Every day | TRANSDERMAL | 0 refills | Status: DC

## 2022-11-07 MED ORDER — POLYETHYLENE GLYCOL 3350 17 G PO PACK: 17.0000 g | PACK | Freq: Every day | ORAL | 0 refills | Status: DC | PRN

## 2022-11-07 NOTE — Plan of Care (Signed)
  Problem: Education: Goal: Ability to describe self-care measures that may prevent or decrease complications (Diabetes Survival Skills Education) will improve Outcome: Adequate for Discharge Goal: Individualized Educational Video(s) Outcome: Adequate for Discharge   Problem: Coping: Goal: Ability to adjust to condition or change in health will improve Outcome: Adequate for Discharge   Problem: Fluid Volume: Goal: Ability to maintain a balanced intake and output will improve Outcome: Adequate for Discharge   Problem: Health Behavior/Discharge Planning: Goal: Ability to identify and utilize available resources and services will improve Outcome: Adequate for Discharge Goal: Ability to manage health-related needs will improve Outcome: Adequate for Discharge   Problem: Nutritional: Goal: Maintenance of adequate nutrition will improve Outcome: Adequate for Discharge Goal: Progress toward achieving an optimal weight will improve Outcome: Adequate for Discharge   Problem: Skin Integrity: Goal: Risk for impaired skin integrity will decrease Outcome: Adequate for Discharge   Problem: Health Behavior/Discharge Planning: Goal: Ability to manage health-related needs will improve Outcome: Adequate for Discharge   Problem: Clinical Measurements: Goal: Ability to maintain clinical measurements within normal limits will improve Outcome: Adequate for Discharge Goal: Will remain free from infection Outcome: Adequate for Discharge Goal: Diagnostic test results will improve Outcome: Adequate for Discharge Goal: Respiratory complications will improve Outcome: Adequate for Discharge Goal: Cardiovascular complication will be avoided Outcome: Adequate for Discharge   Problem: Cardiovascular: Goal: Ability to achieve and maintain adequate cardiovascular perfusion will improve Outcome: Adequate for Discharge Goal: Vascular access site(s) Level 0-1 will be maintained Outcome: Adequate for  Discharge

## 2022-11-07 NOTE — Progress Notes (Addendum)
Patient dentures ( top and bottom), 1 shoe, 1 darco boot, retrieved from [facility] and is in possession of the patient at bedside.

## 2022-11-07 NOTE — Progress Notes (Signed)
PROGRESS NOTE    Kenneth Bailey  MVH:[id number] DOB: 07-02-1965 DOA: 10/30/2022 PCP: [name], MD   Brief Narrative:  The patient is a 57 year old chronically ill-appearing Caucasian male with a past medical history significant for but not limited to uncontrolled diabetes mellitus type 2, CAD status post CABG, chronic combined systolic and diastolic CHF, tobacco abuse as well as other comorbidities who presented to [facility] emergency department at the recommendation of his podiatrist given his persistent left foot pain and concern for osteomyelitis of the left fifth toe.  He has been suffering with a left foot diabetic ulcer for approximately 4 weeks and will prescribe a course of antibiotics through the hospital hospital ED and was referred to podiatry who did follow him for approximate 1 week.  Imaging was done and the podiatrist evaluated and there is finding concerns for osteomyelitis.  Patient was contacted and instructed to go to [name] ED.  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    He was taken to the OR and is status post left foot incision and drainage with resection of fifth metatarsal head on 6/11 with cultures currently growing MRSA.  ID has been consulted for further evaluation recommendations. Given proximal incision necrosis, Podiatry recommends Vascular Consult for evaluation for wound healing and Vascular consulted and recommending obtaining an angiogram of the left lower leg with possible intervention through transfemoral access given that they suspect that he has tibial disease.  Patient was taken to the Cath Lab 11/03/22 and he underwent a left posterior tibial artery and tibioperoneal trunk angioplasty and was placed on aspirin and Plavix but no statin given his  allergy.  Vascular surgery feels that he is optimized from their standpoint PT to reevaluate and treat.  Today he was nauseous and vomiting and then syncopized and had hematemesis as well as melanotic stools.  His aspirin Plavix was discontinued and he was placed on a PPI drip, given 1 L boluses and a stat CBC was done which showed that his blood count dropped so he is getting 2 units of PRBCs.  GI was consulted and patient was taken to the EGD found to have LA grade D reflux esophagitis as well as nonbleeding gastric ulcers with a clean base and a nonbleeding duodenal ulcer with a flat pigmented spot biopsies taken.  He is placed on PPI therapy and blood count dropped again a little bit so we will type and screen transfuse 1 more unit.  GI is now signed off the case going to monitor and if hemoglobin remains stable anticipate discharge in next 24 to 48 hours.  Patient had significant pain today in his foot and will need to continue to be monitored.  Assessment and Plan:  Left Fifth Toe and Lateral Foot Cellulitis with Osteomyelitis -Continued empiric Antibiotics with IV Cefepime q grams q12h and IV Vancomycin; Infectious disease has now stopped the IV cefepime and recommending giving the patient Oritavancin (11/04/22) as the long acting and then have patient do additional 14 day course of Doxycycline 100mg  po bid to start on next Saturday on 11/11/22.  -ABIs done and showed "Right: Resting right  ankle-brachial index indicates moderate right lower  extremity arterial disease. The right toe-brachial index is abnormal. 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 as well as phlegmon/early abscess formation  -WBC Trend: Recent Labs  Lab 11/02/22 0332 11/03/22 0145 11/04/22 0123 11/04/22 1450  11/04/22 1730 11/05/22 0547 11/06/22 0153 11/07/22 0136  WBC 13.2* 14.0* 12.9* 17.5*  --  13.0* 11.4* 13.2*  LATICACIDVEN  --   --   --  1.8 1.3  --   --   --   -Patient's leukocytosis has now worsened in the setting of GI bleeding but is now trending down -The patient is s/p left foot I&D with resection of fifth metatarsal head 6/11 in OR -Blood Cx x2 showing NGTD at 5 Days and now Blood Cx being repeated today per the rapid nurse -Wound Cx obtained and showing: Gram Stain NO WBC SEEN RARE GRAM POSITIVE COCCI IN PAIRS RARE GRAM NEGATIVE RODS  Culture FEW METHICILLIN RESISTANT STAPHYLOCOCCUS AUREUS NO ANAEROBES ISOLATED Performed at [facility], [address] [place], [place] [address]  Report Status 11/05/2022 FINAL  Organism ID, Bacteria METHICILLIN RESISTANT STAPHYLOCOCCUS AUREUS  Resulting Agency [facility]     Susceptibility   Methicillin resistant staphylococcus aureus    MIC    CIPROFLOXACIN >=8 RESISTANT Resistant    CLINDAMYCIN >=8 RESISTANT Resistant    ERYTHROMYCIN >=8 RESISTANT Resistant    GENTAMICIN <=0.5 SENSI... Sensitive    Inducible Clindamycin NEGATIVE Sensitive    LINEZOLID 2 SENSITIVE Sensitive    OXACILLIN >=4 RESISTANT Resistant    RIFAMPIN <=0.5 SENSI... Sensitive    TETRACYCLINE <=1 SENSITIVE Sensitive    TRIMETH/SULFA 160 RESISTANT Resistant    VANCOMYCIN <=0.5 SENSI... Sensitive         -Continue with po Oxycodone 5 mg po q6hprn Moderate/Breakthrough Pain and IV Hydromorphone 0.5 mg IV q4hprn Severe Pain  -Consulted ID for further evaluation and recommendations given his Infection and Resection however podiatry feels that there is surgical cure of his bone infection; ID now recommends Oritavancin 1200 mg today and changing to Oral Doxycycline at Discharge starting 11/11/22 -Podiatry is recommending heel weightbearing in the postoperative shoe -He had some necrosis of the proximal incision so the podiatrist had recommended obtaining a  vascular consultation given his concern for wound healing and vascular Dr. Chestine Spore been consulted -Vascular did an Abdominal aortogram with left lower extremity runoff  and he had a left posterior tibial artery and tibioperoneal trunk angioplasty and was placed on Plavix -Vascular feels that the patient has inline flow down the left lower extremity to the posterior tibial artery retrograde after his and feels that he is optimized from a vascular standpoint and recommending Aspirin Plavix however this is now been stopped given his GI bleeding,: GI recommended dual antiplatelet therapy can be resumed on both aspirin and Plavix have been resumed -PT/OT recommends home health but patient needs further PT evaluation and did not participate given his pain and needs his pain under control prior to discharging.  He also will need wound teaching and home health set up (pending per TOC) so he is unfortunately not able to be discharged today   Acute Kidney Injury, improved Metabolic Acidosis -Likely prerenal in setting of infection with poor intake -Baseline creatinine approximately 1.3 with creatinine 2.9 at presentation  -BUN/Cr Trend: Recent Labs  Lab 11/02/22 0332 11/03/22 0145 11/04/22 0123 11/04/22 1450 11/05/22 0547 11/06/22 0153 11/07/22 0136  BUN 37* 34* 39* 59* 58* 40* 27*  CREATININE 1.09  1.11 1.03 1.12 1.08 1.29* 1.04  -Metabolic acidosis is improved and he has a CO2 of 26,, anion gap of 11, for level of 75 -IVF now stopped -Avoid Nephrotoxic Medications, Contrast Dyes, Hypotension and Dehydration to Ensure Adequate Renal Perfusion and will need to Renally Adjust Meds -Continue to Monitor and Trend Renal Function carefully and repeat CMP in the AM   Chronic Combined Systolic and Diastolic CHF -Euvolemic/somewhat dehydrated at presentation -follow with gentle volume expansion  Intake/Output Summary (Last 24 hours) at 11/07/2022 1800 Last data filed at 11/07/2022 1638 Gross per 24 hour   Intake 540 ml  Output 4500 ml  Net -3960 ml  -Strict I's and O's and Daily Weights -Status post 1 L bolus and 3 units of PRBCs already will need to continue provide status carefully -Continue to Monitor for Signs and Symptoms of Volume Overload now that he is getting blood again  Syncopal Episode in the setting of ABLA from Ulcers -Setting of GI bleeding and patient passed out after coming back from the bathroom and was lowered to the floor by nursing staff -Was placed on a Protonix drip and received 3 units of PRBCs and 1 L bolus when this happened -Continue with progressive care monitoring and will need PT OT to further evaluate and treat now that he is more stable -Will need orthostatics in the a.m. prior to discharge  Nausea and Vomiting, improved -In the setting of Abx and now GI Bleeding -Start IV Zofran and IV Compazine   Tobacco Abuse -Has been counseled on the absolute need to discontinue Tobacco completely -Continue with nicotine patch 14 mg transdermal every 24 hours  HLD -C/w Gemfibrozil 600 mg po BID  -Repeat panel done and showed a total cholesterol/HDL ratio 7.0, cholesterol level 140, HDL 21, LDL 76, triglycerides 253, VLDL 51   CAD status post CABG -Asymptomatic at present -Initially was holding aspirin 81 mg p.o. daily as well as now clopidogrel 75 mg p.o. daily given GI bleeding the day before yesterday and is found to have gastric ulcers and duodenal ulcer as well as LA grade D reflux esophagitis with no bleeding: Now back on aspirin and Plavix -Not on a Statin due to Adverse Effects/Allergies    Uncontrolled DM2 -CBG controlled as an inpatient  -Recent A1c was 10.7 -C/w Diabetic education -Added 10 units of Semglee daily -Continue to Monitor CBGs per protocol; CBG Trend: Recent Labs  Lab 11/06/22 0552 11/06/22 1209 11/06/22 1613 11/06/22 2119 11/07/22 0544 11/07/22 1056 11/07/22 1637  GLUCAP 209* 209* 201* 288* 181* 308* 199*   Normocytic Anemia  and now ABLA in the setting of upper GI bleed with LA grade D reflux esophagitis and nonbleeding gastric ulcers with a clean base and a nonbleeding duodenal ulcer with a flat pigmented spot with Hematemesis and Melena -Hgb/Hct Trend: Recent Labs  Lab 11/05/22 1946 11/06/22 0153 11/06/22 0834 11/06/22 1753 11/06/22 2333 11/07/22 0136 11/07/22 0733  HGB 7.4* 7.1* 7.7* 8.9* 8.6* 9.7* 9.9*  HCT 23.1* 21.2* 24.9* 26.1* 25.0* 28.2* 29.4*  MCV  --  85.1  --   --   --  84.4  --   -Checked Anemia Panel and showed an iron level of 17, UIBC of 299, TIBC 360, saturation ratios 5%, ferritin 193, folate 9.5, vitamin B12 165 -Initiated on iron supplementation but this will not be stopped given his dark emesis and melanotic stools -Start IV PPI and type and screen and transfuse 2 units of PRBCs given that hemoglobin has now  dropped to 7 -Obtain gastric occult testing and FOBT testing; FOBT + -That is post 3 units of PRBCs were transfused -Held his aspirin and Plavix but GI recommending that spring can be resumed 11/06/2022 and Plavix can be resumed on 618 07/03/2022 -Continue to Monitor for S/Sx of Bleeding; No overt bleeding noted now -Repeat CBC in the AM; GI signed off the case and recommending calling back and there is any questions or concerns and recommending continuing PPI twice daily.  Patient has no further emesis or abdominal discomfort  GERD/GI prophylaxis/gastric ulcer and duodenal ulcer -Was On PPI drip but changed to twice daily IV PPI and will need po D/C  -Gastroenterology was following and now signed off  Hypoalbuminemia -Patient's Albumin Trend: Recent Labs  Lab 11/02/22 0332 11/03/22 0145 11/04/22 0123 11/04/22 1450 11/05/22 0547 11/06/22 0153 11/07/22 0136  ALBUMIN 2.9* 3.1* 2.4* 2.4* 2.2* 2.0* 2.5*  -Continue to Monitor and Trend and repeat CMP in the AM   DVT prophylaxis: SCDs Start: 10/30/22 2211    Code Status: Full Code Family Communication: No family currently at  bedside  Disposition Plan:  Level of care: Progressive Cardiac Status is: Inpatient Remains inpatient appropriate because: Pending discharging home in the next 24 to 48 hours once home health has been arranged and once he can ambulate and have his pain under control along with getting some teaching about dressing changes   Consultants:  Podiatry Gastroenterology Vascular Surgery  Procedures:  As delineated as above   POD #6 s/p fifth metatarsal head resection and irrigation and debridement    Procedure Performed: 1.  Ultrasound-guided access right common femoral artery 2.  Aortogram with catheter selection of aorta 3.  Left lower extremity arteriogram with selection of third order branches including the left below-knee popliteal artery 4.  Ultrasound-guided access left posterior tibial artery at the ankle retrograde with pedal sheath placement 5.  Left posterior tibial artery and tibioperoneal trunk angioplasty (3 mm x 220 mm [place]) 6.  Mynx closure of the right common femoral artery 7.  38 minutes of monitored moderate conscious sedation time  EGD Findings:      LA Grade D (one or more mucosal breaks involving at least 75% of       esophageal circumference) esophagitis with no bleeding was found in the       lower third of the esophagus.      Few non-bleeding superficial gastric ulcers with a clean ulcer base       (Forrest Class III) were found in the gastric antrum. The largest lesion       was 5 mm in largest dimension. Biopsies were taken with a cold forceps       for Helicobacter pylori testing.      One non-bleeding superficial duodenal ulcer with a flat pigmented spot       (Forrest Class IIc) was found in the second portion of the duodenum. The       lesion was 12 mm in largest dimension.      A superficial ulcer with a hemocystic spot was noted in the distal       portion of the bulb/proximal D2. It was not bleeding. Impression:               - LA Grade D reflux  esophagitis with no bleeding.                           -  Non-bleeding gastric ulcers with a clean ulcer                            base (Forrest Class III). Biopsied.                           - Non-bleeding duodenal ulcer with a flat pigmented                            spot (Forrest Class IIc). Recommendation:           - Return patient to hospital ward for ongoing care.                           - Resume regular diet.                           - Continue present medications.                           - Resume ASA and Plavix tomorrow as he is s/p                            stenting.                           - [facility] to assume care in the AM.  Antimicrobials:  Anti-infectives (From admission, onward)    Start     Dose/Rate Route Frequency Ordered Stop   11/11/22 1000  doxycycline (VIBRA-TABS) tablet 100 mg        100 mg Oral Every 12 hours 11/03/22 1616 11/25/22 0959   11/11/22 0000  doxycycline (VIBRAMYCIN) 100 MG capsule        100 mg Oral 2 times daily 11/07/22 1624 11/25/22 2359   11/04/22 0800  Oritavancin Diphosphate (ORBACTIV) 1,200 mg in dextrose 5 % IVPB        1,200 mg 333.3 mL/hr over 180 Minutes Intravenous Once 11/03/22 1611 11/04/22 1158   11/03/22 0500  vancomycin (VANCOREADY) IVPB 1500 mg/300 mL  Status:  Discontinued        1,500 mg 150 mL/hr over 120 Minutes Intravenous Every 24 hours 11/02/22 1439 11/03/22 1611   11/02/22 1445  ceFEPIme (MAXIPIME) 2 g in sodium chloride 0.9 % 100 mL IVPB  Status:  Discontinued        2 g 200 mL/hr over 30 Minutes Intravenous Every 8 hours 11/02/22 1437 11/03/22 1430   11/02/22 0500  vancomycin (VANCOREADY) IVPB 1250 mg/250 mL  Status:  Discontinued        1,250 mg 166.7 mL/hr over 90 Minutes Intravenous Every 24 hours 11/01/22 0721 11/02/22 1439   11/01/22 0430  vancomycin (VANCOCIN) IVPB 1000 mg/200 mL premix        1,000 mg 200 mL/hr over 60 Minutes Intravenous  Once 11/01/22 0339 11/01/22 0620   11/01/22 0430  ceFEPIme  (MAXIPIME) 2 g in sodium chloride 0.9 % 100 mL IVPB  Status:  Discontinued        2 g 200 mL/hr over 30 Minutes Intravenous Every 12 hours 11/01/22 0342 11/02/22 1437   11/01/22 0400  ceFEPIme (MAXIPIME) 2 g in sodium chloride 0.9 %  100 mL IVPB  Status:  Discontinued        2 g 200 mL/hr over 30 Minutes Intravenous Every 24 hours 10/31/22 0342 11/01/22 0341   10/31/22 0430  ceFEPIme (MAXIPIME) 2 g in sodium chloride 0.9 % 100 mL IVPB        2 g 200 mL/hr over 30 Minutes Intravenous  Once 10/31/22 0342 10/31/22 0434   10/30/22 2138  vancomycin variable dose per unstable renal function (pharmacist dosing)  Status:  Discontinued         Does not apply See admin instructions 10/30/22 2138 11/01/22 0721   10/30/22 1745  vancomycin (VANCOREADY) IVPB 1500 mg/300 mL        1,500 mg 150 mL/hr over 120 Minutes Intravenous  Once 10/30/22 1743 10/30/22 2058       Subjective: Seen and examined at bedside and he was doing okay today but then subsequently had significant amount of pain and was not able to participate with PT.  Nursing staff informing that he still needs some dressing changes and home health is to be arranged.  He is scheduled to be discharged today but has been canceled now.  He has not had any further bloody bowel movements and has not been any nauseous or vomiting but had pain significantly after I left the room.  Denied any other concerns or complaints at this time.  Objective: Vitals:   11/07/22 0547 11/07/22 0715 11/07/22 1054 11/07/22 1634  BP:  (!) 140/83 130/67 (!) 147/65  Pulse:  74 67 74  Resp:  19 17 17   Temp:  97.9 F (36.6 C) 98.3 F (36.8 C) 98.7 F (37.1 C)  TempSrc:  Oral Oral Oral  SpO2:  90% 94% 96%  Weight: 74.6 kg     Height:        Intake/Output Summary (Last 24 hours) at 11/07/2022 1807 Last data filed at 11/07/2022 1638 Gross per 24 hour  Intake 540 ml  Output 4500 ml  Net -3960 ml   Filed Weights   11/06/22 0500 11/07/22 0538 11/07/22 0547  Weight:  72.7 kg 76.7 kg 74.6 kg   Examination: Physical Exam:  Constitutional: WN/WD Caucasian male who appears to be in no acute distress and has a very withdrawn affect Respiratory: Diminished to auscultation bilaterally, no wheezing, rales, rhonchi or crackles. Normal respiratory effort and patient is not tachypenic. No accessory muscle use.  Unlabored breathing Cardiovascular: RRR, no murmurs / rubs / gallops. S1 and S2 auscultated.  Left foot is wrapped now Abdomen: Soft, non-tender, minimally distended.. Bowel sounds positive.  GU: Deferred. Musculoskeletal: No clubbing / cyanosis of digits/nails.  Left foot is wrapped.  Skin: No rashes, lesions, ulcers. No induration; Warm and dry.  Neurologic: CN 2-12 grossly intact with no focal deficits. Romberg sign and cerebellar reflexes not assessed.  Psychiatric: Normal judgment and insight. Alert and oriented x 3. Normal mood and appropriate affect.   Data Reviewed: I have personally reviewed following labs and imaging studies  CBC: Recent Labs  Lab 11/04/22 0123 11/04/22 1450 11/04/22 1943 11/05/22 0547 11/05/22 1420 11/06/22 0153 11/06/22 0834 11/06/22 1753 11/06/22 2333 11/07/22 0136 11/07/22 0733  WBC 12.9* 17.5*  --  13.0*  --  11.4*  --   --   --  13.2*  --   NEUTROABS 10.6* 14.9*  --  9.7*  --  8.5*  --   --   --  9.2*  --   HGB 9.7* 7.0*   < > 9.0*   < >  7.1* 7.7* 8.9* 8.6* 9.7* 9.9*  HCT 29.3* 21.0*   < > 25.7*   < > 21.2* 24.9* 26.1* 25.0* 28.2* 29.4*  MCV 81.6 84.7  --  82.4  --  85.1  --   --   --  84.4  --   PLT 305 374  --  248  --  225  --   --   --  272  --    < > = values in this interval not displayed.   Basic Metabolic Panel: Recent Labs  Lab 11/03/22 0145 11/04/22 0123 11/04/22 1450 11/05/22 0547 11/06/22 0153 11/07/22 0136  NA 133* 133* 132* 135 132* 132*  K 4.8 4.1 4.5 4.1 4.2 4.4  CL 97* 97* 97* 98 99 95*  CO2 21* 24 23 21* 23 26  GLUCOSE 176* 211* 325* 215* 294* 177*  BUN 34* 39* 59* 58* 40* 27*   CREATININE 1.11 1.03 1.12 1.08 1.29* 1.04  CALCIUM 9.9 9.1 9.2 8.5* 8.2* 9.0  MG 1.7 1.5*  --  1.7 1.7 1.9  PHOS 4.0 3.7  --  3.2 3.5 4.0   GFR: Estimated Creatinine Clearance: 77.6 mL/min (by C-G formula based on SCr of 1.04 mg/dL). Liver Function Tests: Recent Labs  Lab 11/04/22 0123 11/04/22 1450 11/05/22 0547 11/06/22 0153 11/07/22 0136  AST 15 15 11* 30 36  ALT 15 15 11 27  41  ALKPHOS 66 63 50 59 72  BILITOT 0.7 0.4 0.5 0.4 0.6  PROT 6.6 6.4* 5.5* 5.2* 6.3*  ALBUMIN 2.4* 2.4* 2.2* 2.0* 2.5*   No results for input(s): "LIPASE", "AMYLASE" in the last 168 hours. No results for input(s): "AMMONIA" in the last 168 hours. Coagulation Profile: Recent Labs  Lab 11/04/22 1450  INR 1.2   Cardiac Enzymes: No results for input(s): "CKTOTAL", "CKMB", "CKMBINDEX", "TROPONINI" in the last 168 hours. BNP (last 3 results) No results for input(s): "PROBNP" in the last 8760 hours. HbA1C: No results for input(s): "HGBA1C" in the last 72 hours. CBG: Recent Labs  Lab 11/06/22 1613 11/06/22 2119 11/07/22 0544 11/07/22 1056 11/07/22 1637  GLUCAP 201* 288* 181* 308* 199*   Lipid Profile: No results for input(s): "CHOL", "HDL", "LDLCALC", "TRIG", "CHOLHDL", "LDLDIRECT" in the last 72 hours. Thyroid Function Tests: No results for input(s): "TSH", "T4TOTAL", "FREET4", "T3FREE", "THYROIDAB" in the last 72 hours. Anemia Panel: No results for input(s): "VITAMINB12", "FOLATE", "FERRITIN", "TIBC", "IRON", "RETICCTPCT" in the last 72 hours. Sepsis Labs: Recent Labs  Lab 11/04/22 1450 11/04/22 1730  LATICACIDVEN 1.8 1.3   Recent Results (from the past 240 hour(s))  Blood culture (routine x 2)     Status: None   Collection Time: 10/30/22  4:58 PM   Specimen: BLOOD  Result Value Ref Range Status   Specimen Description BLOOD LEFT FOREARM  Final   Special Requests   Final    BOTTLES DRAWN AEROBIC AND ANAEROBIC Blood Culture adequate volume   Culture   Final    NO GROWTH 5  DAYS Performed at [facility], [address] [place], [place] [address]    Report Status 11/04/2022 FINAL  Final  Blood culture (routine x 2)     Status: None   Collection Time: 10/30/22  5:11 PM   Specimen: BLOOD  Result Value Ref Range Status   Specimen Description BLOOD RIGHT ANTECUBITAL  Final   Special Requests   Final    BOTTLES DRAWN AEROBIC AND ANAEROBIC Blood Culture results may not be optimal due to an  excessive volume of blood received in culture bottles   Culture   Final    NO GROWTH 5 DAYS Performed at [facility], [address], [place], [place] [address]    Report Status 11/04/2022 FINAL  Final  MRSA Next Gen by PCR, Nasal     Status: None   Collection Time: 10/31/22  3:44 AM   Specimen: Nasal Mucosa; Nasal Swab  Result Value Ref Range Status   MRSA by PCR Next Gen NOT DETECTED NOT DETECTED Final    Comment: (NOTE) The GeneXpert MRSA Assay (FDA approved for NASAL specimens only), is one component of a comprehensive MRSA colonization surveillance program. It is not intended to diagnose MRSA infection nor to guide or monitor treatment for MRSA infections. Test performance is not FDA approved in patients less than 39 years old. Performed at [facility], [address] [place], [place] [address]   Aerobic/Anaerobic Culture w Gram Stain (surgical/deep wound)     Status: None   Collection Time: 10/31/22  1:24 PM   Specimen: Toe, Left; Amputation  Result Value Ref Range Status   Specimen Description WOUND LEFT TOE  Final   Special Requests 5TH METATARSAL HEAD PT ON VANC CEFEPIME  Final   Gram Stain   Final    NO WBC SEEN RARE GRAM POSITIVE COCCI IN PAIRS RARE GRAM NEGATIVE RODS    Culture   Final    FEW METHICILLIN RESISTANT STAPHYLOCOCCUS AUREUS NO ANAEROBES ISOLATED Performed at [facility], [address] [place], [place] [address]    Report Status 11/05/2022 FINAL  Final   Organism ID, Bacteria METHICILLIN RESISTANT  STAPHYLOCOCCUS AUREUS  Final      Susceptibility   Methicillin resistant staphylococcus aureus - MIC*    CIPROFLOXACIN >=8 RESISTANT Resistant     ERYTHROMYCIN >=8 RESISTANT Resistant     GENTAMICIN <=0.5 SENSITIVE Sensitive     OXACILLIN >=4 RESISTANT Resistant     TETRACYCLINE <=1 SENSITIVE Sensitive     VANCOMYCIN <=0.5 SENSITIVE Sensitive     TRIMETH/SULFA 160 RESISTANT Resistant     CLINDAMYCIN >=8 RESISTANT Resistant     RIFAMPIN <=0.5 SENSITIVE Sensitive     Inducible Clindamycin NEGATIVE Sensitive     LINEZOLID 2 SENSITIVE Sensitive     * FEW METHICILLIN RESISTANT STAPHYLOCOCCUS AUREUS  Surgical pcr screen     Status: None   Collection Time: 11/03/22  7:07 AM   Specimen: Nasal Mucosa; Nasal Swab  Result Value Ref Range Status   MRSA, PCR NEGATIVE NEGATIVE Final   Staphylococcus aureus NEGATIVE NEGATIVE Final    Comment: (NOTE) The Xpert SA Assay (FDA approved for NASAL specimens in patients 58 years of age and older), is one component of a comprehensive surveillance program. It is not intended to diagnose infection nor to guide or monitor treatment. Performed at [facility], [address], [place], [place] [address]   Culture, blood (x 2)     Status: None (Preliminary result)   Collection Time: 11/04/22  2:50 PM   Specimen: BLOOD  Result Value Ref Range Status   Specimen Description BLOOD SITE NOT SPECIFIED  Final   Special Requests   Final    BOTTLES DRAWN AEROBIC AND ANAEROBIC Blood Culture results may not be optimal due to an inadequate volume of blood received in culture bottles   Culture   Final    NO GROWTH 3 DAYS Performed at [facility], [address], [place],  [place] [address]    Report Status PENDING  Incomplete  Culture, blood (x 2)     Status: None (Preliminary result)   Collection Time: 11/04/22  2:54 PM   Specimen: BLOOD  Result Value Ref Range Status   Specimen Description BLOOD BLOOD RIGHT HAND  Final   Special Requests   Final     BOTTLES DRAWN AEROBIC AND ANAEROBIC Blood Culture results may not be optimal due to an inadequate volume of blood received in culture bottles   Culture   Final    NO GROWTH 3 DAYS Performed at [facility], [address], [place], [place] [address]    Report Status PENDING  Incomplete    Radiology Studies: No results found.  Scheduled Meds:  aspirin EC  81 mg Oral Daily   clopidogrel  75 mg Oral Daily   [START ON 11/11/2022] doxycycline  100 mg Oral Q12H   ezetimibe  10 mg Oral Daily   gemfibrozil  600 mg Oral BID AC   insulin aspart  0-5 Units Subcutaneous QHS   insulin aspart  0-9 Units Subcutaneous TID WC   insulin glargine-yfgn  10 Units Subcutaneous Daily   melatonin  10 mg Oral QHS   nicotine  14 mg Transdermal Daily   [START ON 11/08/2022] pantoprazole  40 mg Intravenous Q12H   sodium chloride flush  3 mL Intravenous Q12H   Continuous Infusions:  sodium chloride      LOS: 8 days   Marguerita Merles, DO [facility] Available via Epic secure chat 7am-7pm After these hours, please refer to coverage provider listed on [email or web address] 11/07/2022, 6:07 PM

## 2022-11-07 NOTE — Inpatient Diabetes Management (Signed)
Inpatient Diabetes Program Recommendations  AACE/ADA: New Consensus Statement on Inpatient Glycemic Control (2015)  Target Ranges:  Prepandial:   less than 140 mg/dL      Peak postprandial:   less than 180 mg/dL (1-2 hours)      Critically ill patients:  140 - 180 mg/dL   Lab Results  Component Value Date   GLUCAP 313 (H) 11/07/2022   HGBA1C 10.7 (H) 10/11/2022    Review of Glycemic Control  Diabetes history: DM2 Outpatient Diabetes medications: Semglee 12-50 units BID, s/s, metformin 1000 mg BID, Farxiga 10 QD Current orders for Inpatient glycemic control: Semglee 10 every day, Novolog 0-9 units TID with meals  HgbA1C - 10.7%  Inpatient Diabetes Program Recommendations:    Add Novolog 3 units TID with meals if eating > 50%  Increase Semglee to 15 units every day if FBS> 180 mg/dL.  Spoke with pt at bedside this afternoon regarding his HgbA1C and glucose control.  Pt states he has big fluctuations with blood sugars. Does not take rapid-acting insulin and uses a "sliding scale" for basal insulin according to po intake. Discussed basal vs bolus insulin and his basal dose should be set dose by MD. Bolus for meals should be based on po intake and correction. Pt voices understanding. Will need to f/u with PCP.  Thank you. Ailene Ards, RD, LDN, CDCES Inpatient Diabetes Coordinator [phone]

## 2022-11-07 NOTE — Progress Notes (Signed)
  Subjective:  Patient ID: Kenneth Bailey, male    DOB: May 02, 1966,  MRN: [id number]  Patient POD#6 s/p left fifth metatarsal head resection and irrigation and debridement. S/p abdominal aortogram with intervention and optimization from vascular.  Pain in the foot controlled.  CBC    Component Value Date/Time   WBC 13.2 (H) 11/07/2022 0136   RBC 3.34 (L) 11/07/2022 0136   HGB 9.9 (L) 11/07/2022 0733   HCT 29.4 (L) 11/07/2022 0733   PLT 272 11/07/2022 0136   MCV 84.4 11/07/2022 0136   MCH 29.0 11/07/2022 0136   MCHC 34.4 11/07/2022 0136   RDW 14.0 11/07/2022 0136   LYMPHSABS 1.9 11/07/2022 0136   MONOABS 0.7 11/07/2022 0136   EOSABS 0.3 11/07/2022 0136   BASOSABS 0.1 11/07/2022 0136      Latest Ref Rng & Units 11/07/2022    1:36 AM 11/06/2022    1:53 AM 11/05/2022    5:47 AM  BMP  Glucose 70 - 99 mg/dL 409  811  914   BUN 6 - 20 mg/dL 27  40  58   Creatinine 0.61 - 1.24 mg/dL 7.82  9.56  2.13   Sodium 135 - 145 mmol/L 132  132  135   Potassium 3.5 - 5.1 mmol/L 4.4  4.2  4.1   Chloride 98 - 111 mmol/L 95  99  98   CO2 22 - 32 mmol/L 26  23  21    Calcium 8.9 - 10.3 mg/dL 9.0  8.2  8.5      Objective:   Vitals:   11/07/22 1634 11/07/22 1947  BP: (!) 147/65 (!) 125/58  Pulse: 74 80  Resp: 17 18  Temp: 98.7 F (37.1 C) 98.6 F (37 C)  SpO2: 96% 96%      General:AA&O x 3. Normal mood and affect   Vascular: Foot warm and perfused.   Neruological. Epicritic sensation grossly intact.   Derm: Incision well coapted, No signes of dehiscence.  Necrosis noted to the lateral aspect of the foot along the area incision.  See pictures below.  Some of the bloody drainage expressed.  I did remove the proximal suture and it appeared to be a hematoma and bloody fluid was expressed but there is no frank purulence noted today.         MSK: Mild tenderness on the surgical site  This is aAssessment & Plan:  Patient was evaluated and treated and all questions  answered.  DX: POD #6 s/p fifth metatarsal head resection and irrigation and debridement  -I debrided area of necrosis today and I remove the proximal suture.  I irrigated with saline.  His white blood cell count did increase today and if there is further worsening of this or no improvement will return to the OR for debridement, washout Wednesday night vs Thursday if needed.  -I will order new x-ray as well today. -Betadine applied followed by dry sterile dressing. -DME: Heel weight bearing in post-op shoe   -Cultures growing MRSA -Path: osteomyelitis  -WBC elevated today, will monitor  -I will plan to change the dressing tomorrow for further evaluation   Ovid Curd, DPM

## 2022-11-08 ENCOUNTER — Inpatient Hospital Stay (HOSPITAL_COMMUNITY): Payer: BC Managed Care – PPO | Admitting: Certified Registered Nurse Anesthetist

## 2022-11-08 ENCOUNTER — Other Ambulatory Visit: Payer: Self-pay

## 2022-11-08 ENCOUNTER — Encounter (HOSPITAL_COMMUNITY): Payer: Self-pay | Admitting: Gastroenterology

## 2022-11-08 ENCOUNTER — Inpatient Hospital Stay (HOSPITAL_COMMUNITY): Payer: BC Managed Care – PPO

## 2022-11-08 ENCOUNTER — Encounter (HOSPITAL_COMMUNITY)
Admission: EM | Disposition: A | Discharge status: Home / Self Care | Payer: Self-pay | Source: Home / Self Care | Attending: Internal Medicine

## 2022-11-08 DIAGNOSIS — M86172 Other acute osteomyelitis, left ankle and foot: Secondary | ICD-10-CM | POA: Diagnosis not present

## 2022-11-08 HISTORY — PX: I & D EXTREMITY: SHX5045

## 2022-11-08 LAB — PHOSPHORUS: Phosphorus: 3.7 mg/dL (ref 2.5–4.6)

## 2022-11-08 LAB — GLUCOSE, CAPILLARY
Glucose-Capillary: 272 mg/dL — ABNORMAL HIGH (ref 70–99)
Glucose-Capillary: 148 mg/dL — ABNORMAL HIGH (ref 70–99)
Glucose-Capillary: 122 mg/dL — ABNORMAL HIGH (ref 70–99)
Glucose-Capillary: 123 mg/dL — ABNORMAL HIGH (ref 70–99)
Glucose-Capillary: 277 mg/dL — ABNORMAL HIGH (ref 70–99)

## 2022-11-08 LAB — CBC WITH DIFFERENTIAL/PLATELET
Abs Immature Granulocytes: 0 10*3/uL (ref 0.00–0.07)
Basophils Absolute: 0.3 10*3/uL — ABNORMAL HIGH (ref 0.0–0.1)
Basophils Relative: 2 %
Eosinophils Absolute: 0 10*3/uL (ref 0.0–0.5)
Eosinophils Relative: 0 %
HCT: 26.9 % — ABNORMAL LOW (ref 39.0–52.0)
Hemoglobin: 9.1 g/dL — ABNORMAL LOW (ref 13.0–17.0)
Lymphocytes Relative: 12 %
Lymphs Abs: 1.7 10*3/uL (ref 0.7–4.0)
MCH: 28.2 pg (ref 26.0–34.0)
MCHC: 33.8 g/dL (ref 30.0–36.0)
MCV: 83.3 fL (ref 80.0–100.0)
Monocytes Absolute: 0.6 10*3/uL (ref 0.1–1.0)
Monocytes Relative: 4 %
Neutro Abs: 11.8 10*3/uL — ABNORMAL HIGH (ref 1.7–7.7)
Neutrophils Relative %: 82 %
Platelets: 298 10*3/uL (ref 150–400)
RBC: 3.23 MIL/uL — ABNORMAL LOW (ref 4.22–5.81)
RDW: 14 % (ref 11.5–15.5)
WBC: 14.4 10*3/uL — ABNORMAL HIGH (ref 4.0–10.5)
nRBC: 0 % (ref 0.0–0.2)
nRBC: 0 /100 WBC

## 2022-11-08 LAB — COMPREHENSIVE METABOLIC PANEL
ALT: 30 U/L (ref 0–44)
AST: 19 U/L (ref 15–41)
Albumin: 2.5 g/dL — ABNORMAL LOW (ref 3.5–5.0)
Alkaline Phosphatase: 78 U/L (ref 38–126)
Anion gap: 11 (ref 5–15)
BUN: 24 mg/dL — ABNORMAL HIGH (ref 6–20)
CO2: 24 mmol/L (ref 22–32)
Calcium: 9.1 mg/dL (ref 8.9–10.3)
Chloride: 95 mmol/L — ABNORMAL LOW (ref 98–111)
Creatinine, Ser: 1.22 mg/dL (ref 0.61–1.24)
GFR, Estimated: >60 mL/min (ref >60)
Glucose, Bld: 288 mg/dL — ABNORMAL HIGH (ref 70–99)
Potassium: 4.4 mmol/L (ref 3.5–5.1)
Sodium: 130 mmol/L — ABNORMAL LOW (ref 135–145)
Total Bilirubin: 0.5 mg/dL (ref 0.3–1.2)
Total Protein: 6.4 g/dL — ABNORMAL LOW (ref 6.5–8.1)

## 2022-11-08 LAB — MAGNESIUM: Magnesium: 1.7 mg/dL (ref 1.7–2.4)

## 2022-11-08 SURGERY — IRRIGATION AND DEBRIDEMENT EXTREMITY
Anesthesia: General | Laterality: Left

## 2022-11-08 MED ORDER — LIDOCAINE HCL 2 % IJ SOLN
INTRAMUSCULAR | Status: DC | PRN
  Administered 2022-11-08: 25 mL

## 2022-11-08 MED ORDER — LACTATED RINGERS IV SOLN: INTRAVENOUS | Status: DC

## 2022-11-08 MED ORDER — CEFAZOLIN SODIUM 1 G IJ SOLR
INTRAMUSCULAR | Status: AC
  Filled 2022-11-08: qty 20

## 2022-11-08 MED ORDER — LIDOCAINE 2% (20 MG/ML) 5 ML SYRINGE
INTRAMUSCULAR | Status: AC
  Filled 2022-11-08: qty 5

## 2022-11-08 MED ORDER — CHLORHEXIDINE GLUCONATE 0.12 % MT SOLN
OROMUCOSAL | Status: AC
  Filled 2022-11-08: qty 15

## 2022-11-08 MED ORDER — OXYCODONE HCL 5 MG PO TABS: 5.0000 mg | ORAL_TABLET | Freq: Once | ORAL | Status: DC | PRN

## 2022-11-08 MED ORDER — CHLORHEXIDINE GLUCONATE CLOTH 2 % EX PADS: 6.0000 | MEDICATED_PAD | Freq: Once | CUTANEOUS | Status: DC

## 2022-11-08 MED ORDER — FENTANYL CITRATE (PF) 250 MCG/5ML IJ SOLN
INTRAMUSCULAR | Status: DC | PRN
  Administered 2022-11-08 (×4): 25 ug via INTRAVENOUS
  Administered 2022-11-08: 75 ug via INTRAVENOUS

## 2022-11-08 MED ORDER — DEXAMETHASONE SODIUM PHOSPHATE 10 MG/ML IJ SOLN
INTRAMUSCULAR | Status: AC
  Filled 2022-11-08: qty 1

## 2022-11-08 MED ORDER — 0.9 % SODIUM CHLORIDE (POUR BTL) OPTIME
TOPICAL | Status: DC | PRN
  Administered 2022-11-08: 1000 mL

## 2022-11-08 MED ORDER — BUPIVACAINE HCL (PF) 0.5 % IJ SOLN
INTRAMUSCULAR | Status: AC
  Filled 2022-11-08: qty 30

## 2022-11-08 MED ORDER — OXYCODONE HCL 5 MG/5ML PO SOLN: 5.0000 mg | Freq: Once | ORAL | Status: DC | PRN

## 2022-11-08 MED ORDER — LIDOCAINE 2% (20 MG/ML) 5 ML SYRINGE
INTRAMUSCULAR | Status: DC | PRN
  Administered 2022-11-08: 60 mg via INTRAVENOUS

## 2022-11-08 MED ORDER — ORAL CARE MOUTH RINSE: 15.0000 mL | Freq: Once | OROMUCOSAL | Status: DC

## 2022-11-08 MED ORDER — PROPOFOL 10 MG/ML IV BOLUS
INTRAVENOUS | Status: AC
  Filled 2022-11-08: qty 20

## 2022-11-08 MED ORDER — MIDAZOLAM HCL 2 MG/2ML IJ SOLN
INTRAMUSCULAR | Status: DC | PRN
  Administered 2022-11-08: 1 mg via INTRAVENOUS

## 2022-11-08 MED ORDER — PROPOFOL 10 MG/ML IV BOLUS
INTRAVENOUS | Status: DC | PRN
  Administered 2022-11-08: 180 mg via INTRAVENOUS

## 2022-11-08 MED ORDER — SODIUM CHLORIDE 0.9 % IR SOLN
Status: DC | PRN
  Administered 2022-11-08: 1000 mL

## 2022-11-08 MED ORDER — ONDANSETRON HCL 4 MG/2ML IJ SOLN: 4.0000 mg | Freq: Four times a day (QID) | INTRAMUSCULAR | Status: DC | PRN

## 2022-11-08 MED ORDER — ONDANSETRON HCL 4 MG/2ML IJ SOLN
INTRAMUSCULAR | Status: AC
  Filled 2022-11-08: qty 2

## 2022-11-08 MED ORDER — FENTANYL CITRATE (PF) 100 MCG/2ML IJ SOLN: 25.0000 ug | INTRAMUSCULAR | Status: DC | PRN

## 2022-11-08 MED ORDER — ONDANSETRON HCL 4 MG/2ML IJ SOLN
INTRAMUSCULAR | Status: DC | PRN
  Administered 2022-11-08: 4 mg via INTRAVENOUS

## 2022-11-08 MED ORDER — CEFAZOLIN SODIUM-DEXTROSE 2-3 GM-%(50ML) IV SOLR
INTRAVENOUS | Status: DC | PRN
  Administered 2022-11-08: 2 g via INTRAVENOUS

## 2022-11-08 MED ORDER — DEXAMETHASONE SODIUM PHOSPHATE 10 MG/ML IJ SOLN
INTRAMUSCULAR | Status: DC | PRN
  Administered 2022-11-08: 4 mg via INTRAVENOUS

## 2022-11-08 MED ORDER — LIDOCAINE HCL 2 % IJ SOLN
INTRAMUSCULAR | Status: AC
  Filled 2022-11-08: qty 20

## 2022-11-08 MED ORDER — FENTANYL CITRATE (PF) 250 MCG/5ML IJ SOLN
INTRAMUSCULAR | Status: AC
  Filled 2022-11-08: qty 5

## 2022-11-08 MED ORDER — MIDAZOLAM HCL 2 MG/2ML IJ SOLN
INTRAMUSCULAR | Status: AC
  Filled 2022-11-08: qty 2

## 2022-11-08 MED ORDER — CHLORHEXIDINE GLUCONATE 0.12 % MT SOLN: 15.0000 mL | Freq: Once | OROMUCOSAL | Status: DC

## 2022-11-08 SURGICAL SUPPLY — 60 items
APL PRP STRL LF DISP 70% ISPRP (MISCELLANEOUS)
BAG COUNTER SPONGE SURGICOUNT (BAG) ×1 IMPLANT
BAG SPNG CNTER NS LX DISP (BAG) ×1
BLADE LONG MED 31X9 (MISCELLANEOUS) IMPLANT
BLADE SAW SGTL 83.5X18.5 (BLADE) ×1 IMPLANT
BLADE SURG 15 STRL LF DISP TIS (BLADE) ×3 IMPLANT
BLADE SURG 15 STRL SS (BLADE) ×3
BNDG CMPR 5X4 KNIT ELC UNQ LF (GAUZE/BANDAGES/DRESSINGS) ×1
BNDG CMPR 5X6 CHSV STRCH STRL (GAUZE/BANDAGES/DRESSINGS)
BNDG CMPR 9X4 STRL LF SNTH (GAUZE/BANDAGES/DRESSINGS)
BNDG COHESIVE 6X5 TAN ST LF (GAUZE/BANDAGES/DRESSINGS) IMPLANT
BNDG ELASTIC 4INX 5YD STR LF (GAUZE/BANDAGES/DRESSINGS) IMPLANT
BNDG ELASTIC 4X5.8 VLCR STR LF (GAUZE/BANDAGES/DRESSINGS) IMPLANT
BNDG ESMARK 4X9 LF (GAUZE/BANDAGES/DRESSINGS) ×1 IMPLANT
BNDG GAUZE DERMACEA FLUFF 4 (GAUZE/BANDAGES/DRESSINGS) ×2 IMPLANT
BNDG GZE DERMACEA 4 6PLY (GAUZE/BANDAGES/DRESSINGS) ×1
BOWL SMART MIX CTS (DISPOSABLE) IMPLANT
CHLORAPREP W/TINT 26 (MISCELLANEOUS) ×1 IMPLANT
COVER SURGICAL LIGHT HANDLE (MISCELLANEOUS) ×1 IMPLANT
CUFF TOURN SGL QUICK 18X4 (TOURNIQUET CUFF) ×1 IMPLANT
CUFF TOURN SGL QUICK 34 (TOURNIQUET CUFF) ×1
CUFF TRNQT CYL 34X4.125X (TOURNIQUET CUFF) ×1 IMPLANT
DRAPE OEC MINIVIEW 54X84 (DRAPES) ×1 IMPLANT
DRAPE U-SHAPE 47X51 STRL (DRAPES) ×1 IMPLANT
ELECT CAUTERY BLADE 6.4 (BLADE) ×1 IMPLANT
ELECT REM PT RETURN 9FT ADLT (ELECTROSURGICAL)
ELECTRODE REM PT RTRN 9FT ADLT (ELECTROSURGICAL) IMPLANT
GAUZE PAD ABD 8X10 STRL (GAUZE/BANDAGES/DRESSINGS) ×1 IMPLANT
GAUZE SPONGE 4X4 12PLY STRL (GAUZE/BANDAGES/DRESSINGS) ×1 IMPLANT
GAUZE XEROFORM 1X8 LF (GAUZE/BANDAGES/DRESSINGS) ×1 IMPLANT
GLOVE BIO SURGEON STRL SZ8 (GLOVE) ×1 IMPLANT
GLOVE BIOGEL PI IND STRL 8 (GLOVE) ×1 IMPLANT
GOWN STRL REUS W/ TWL LRG LVL3 (GOWN DISPOSABLE) ×2 IMPLANT
GOWN STRL REUS W/TWL LRG LVL3 (GOWN DISPOSABLE) ×2
HANDPIECE INTERPULSE COAX TIP (DISPOSABLE) ×1
KIT BASIN OR (CUSTOM PROCEDURE TRAY) ×1 IMPLANT
KIT TURNOVER KIT B (KITS) ×1 IMPLANT
MANIFOLD NEPTUNE II (INSTRUMENTS) ×1 IMPLANT
NDL HYPO 25GX1X1/2 BEV (NEEDLE) ×1 IMPLANT
NEEDLE HYPO 25GX1X1/2 BEV (NEEDLE) IMPLANT
NS IRRIG 1000ML POUR BTL (IV SOLUTION) ×1 IMPLANT
PACK ORTHO EXTREMITY (CUSTOM PROCEDURE TRAY) ×1 IMPLANT
PAD ABD 8X10 STRL (GAUZE/BANDAGES/DRESSINGS) IMPLANT
PAD ARMBOARD 7.5X6 YLW CONV (MISCELLANEOUS) ×2 IMPLANT
PAD CAST 4YDX4 CTTN HI CHSV (CAST SUPPLIES) ×1 IMPLANT
PADDING CAST COTTON 4X4 STRL (CAST SUPPLIES)
PADDING CAST COTTON 6X4 STRL (CAST SUPPLIES) ×1 IMPLANT
SET HNDPC FAN SPRY TIP SCT (DISPOSABLE) IMPLANT
SOL PREP POV-IOD 4OZ 10% (MISCELLANEOUS) ×2 IMPLANT
STAPLER VISISTAT (STAPLE) ×1 IMPLANT
STAPLER VISISTAT 35W (STAPLE) ×1 IMPLANT
STOCKINETTE IMPERVIOUS 9X36 MD (GAUZE/BANDAGES/DRESSINGS) ×1 IMPLANT
SUT PROLENE 3 0 PS 2 (SUTURE) ×1 IMPLANT
SWAB CULTURE LIQ STUART DBL (MISCELLANEOUS) ×1 IMPLANT
SWAB CULTURE LIQUID MINI MALE (MISCELLANEOUS) ×1 IMPLANT
SYR CONTROL 10ML LL (SYRINGE) ×1 IMPLANT
TOWEL GREEN STERILE (TOWEL DISPOSABLE) ×1 IMPLANT
TOWEL GREEN STERILE FF (TOWEL DISPOSABLE) ×1 IMPLANT
TUBE CONNECTING 12X1/4 (SUCTIONS) ×1 IMPLANT
YANKAUER SUCT BULB TIP NO VENT (SUCTIONS) ×1 IMPLANT

## 2022-11-08 NOTE — H&P (View-Only) (Signed)
WBC elevated today to 14.4. Repeat x-ray shows increased air but this is after I have opened one of the sutures and irrigated the wound which likely corresponds to this. I will plan on surgery tonight after 5pm pending OR availability. I will be by to discuss with the patient early this afternoon. For now will start NPO at 9am in anticipation of surgery tonight.   Surgery will entail debridement of the wound.  

## 2022-11-08 NOTE — Transfer of Care (Signed)
Immediate Anesthesia Transfer of Care Note  Patient: Kenneth Bailey  Procedure(s) Performed: IRRIGATION AND DEBRIDEMENT OF FOOT AND WASHOUT (Left)  Patient Location: PACU  Anesthesia Type:General  Level of Consciousness: drowsy  Airway & Oxygen Therapy: Patient connected to face mask oxygen  Post-op Assessment: Report given to RN and Post -op Vital signs reviewed and stable  Post vital signs: Reviewed and stable  Last Vitals:  Vitals Value Taken Time  BP 108/61   Temp    Pulse 76 11/08/22 1843  Resp    SpO2 100 % 11/08/22 1843  Vitals shown include unvalidated device data.  Last Pain:  Vitals:   11/08/22 1730  TempSrc:   PainSc: 9       Patients Stated Pain Goal: 2 (11/08/22 6045)  Complications: There were no known notable events for this encounter.

## 2022-11-08 NOTE — Progress Notes (Signed)
Occupational Therapy Treatment Patient Details Name: Kenneth Bailey MRN: [id number] DOB: November 14, 1965 Today's Date: 11/08/2022   History of present illness Pt is a 57 y.o. male admitted on 10/30/2022 with osteomyelitis. He is now s/p Left foot irrigation and debridement with resection of fifth metatarsal head 11/01/22. Past medical history significant for uncontrolled type 2 diabetes, diabetic polyneuropathy, coronary artery disease status post CABG, chronic combined diastolic and systolic CHF, current smoker,   OT comments  Pt with impending foot surgery today. Continues to have significant L foot pain, especially in dependent position when seated EOB. Pt only tolerating ADLs in sitting for short period of time, prefers to keep L LE elevated in supine. Pt has no family support and has yet to ambulate with complications of pain and orthostatic hypotension.    Recommendations for follow up therapy are one component of a multi-disciplinary discharge planning process, led by the attending physician.  Recommendations may be updated based on patient status, additional functional criteria and insurance authorization.    Assistance Recommended at Discharge Intermittent Supervision/Assistance  Patient can return home with the following  A little help with walking and/or transfers;Assistance with cooking/housework;A little help with bathing/dressing/bathroom;Assist for transportation;Help with stairs or ramp for entrance   Equipment Recommendations  None recommended by OT    Recommendations for Other Services      Precautions / Restrictions Precautions Precautions: Fall Required Braces or Orthoses: Other Brace Other Brace: post op shoe Restrictions Weight Bearing Restrictions: Yes LLE Weight Bearing: Partial weight bearing LLE Partial Weight Bearing Percentage or Pounds: through heel Other Position/Activity Restrictions: through heel per MD order       Mobility Bed Mobility Overal bed  mobility: Needs Assistance Bed Mobility: Supine to Sit, Sit to Supine     Supine to sit: Supervision Sit to supine: Supervision        Transfers                   General transfer comment: declined due to pain with foot in dependent position     Balance Overall balance assessment: Needs assistance   Sitting balance-Leahy Scale: Good                                     ADL either performed or assessed with clinical judgement   ADL Overall ADL's : Needs assistance/impaired     Grooming: Wash/dry hands;Wash/dry face;Sitting   Upper Body Bathing: Minimal assistance;Sitting       Upper Body Dressing : Sitting;Set up                          Extremity/Trunk Assessment              Vision       Perception     Praxis      Cognition Arousal/Alertness: Awake/alert Behavior During Therapy: Flat affect Overall Cognitive Status: Impaired/Different from baseline Area of Impairment: Problem solving, Safety/judgement                         Safety/Judgement: Decreased awareness of deficits, Decreased awareness of safety   Problem Solving: Decreased initiation General Comments: limited by pain        Exercises      Shoulder Instructions       General Comments      Pertinent Vitals/ Pain  Pain Assessment Pain Assessment: 0-10 Pain Score: 10-Worst pain ever Pain Location: L foot Pain Descriptors / Indicators: Throbbing, Moaning, Grimacing, Guarding Pain Intervention(s): Monitored during session, Repositioned  Home Living                                          Prior Functioning/Environment              Frequency  Min 2X/week        Progress Toward Goals  OT Goals(current goals can now be found in the care plan section)  Progress towards OT goals: Not progressing toward goals - comment  Acute Rehab OT Goals OT Goal Formulation: With patient Time For Goal Achievement:  11/15/22 Potential to Achieve Goals: Good  Plan Frequency remains appropriate    Co-evaluation                 AM-PAC OT "6 Clicks" Daily Activity     Outcome Measure   Help from another person eating meals?: None Help from another person taking care of personal grooming?: A Little Help from another person toileting, which includes using toliet, bedpan, or urinal?: A Little Help from another person bathing (including washing, rinsing, drying)?: A Little Help from another person to put on and taking off regular upper body clothing?: A Little Help from another person to put on and taking off regular lower body clothing?: A Little 6 Click Score: 19    End of Session    OT Visit Diagnosis: Unsteadiness on feet (R26.81);Pain Pain - Right/Left: Left Pain - part of body: Ankle and joints of foot   Activity Tolerance Patient limited by pain   Patient Left in bed;with call bell/phone within reach;with bed alarm set   Nurse Communication          Time: 9147-8295 OT Time Calculation (min): 15 min  Charges: OT General Charges $OT Visit: 1 Visit OT Treatments $Self Care/Home Management : 8-22 mins Berna Spare, OTR/L Acute Rehabilitation Services Office: [phone]   Evern Bio 11/08/2022, 9:49 AM

## 2022-11-08 NOTE — Progress Notes (Signed)
Mobility Specialist Progress Note:    11/08/22 1209  Mobility  Activity Stood at bedside;Dangled on edge of bed  Level of Assistance Contact guard assist, steadying assist  Assistive Device Front wheel walker  LLE Weight Bearing PWB  Activity Response Tolerated well  Mobility Referral Yes  $Mobility charge 1 Mobility  Mobility Specialist Start Time (ACUTE ONLY) 1140  Mobility Specialist Stop Time (ACUTE ONLY) 1209  Mobility Specialist Time Calculation (min) (ACUTE ONLY) 29 min   Pt received in bed, hesitant but agreeable to mobilize. Pt required no physical assistance to mobilize. Pt able to stand at EOB for 45 seconds, needing to sit down d/t pain rating it a 10/10. Pt assisted back to supine. RN notified.    Thompson Grayer Mobility Specialist  Please contact vis Secure Chat or  Rehab Office [phone]

## 2022-11-08 NOTE — Progress Notes (Signed)
PT Cancellation Note  Patient Details Name: Kenneth Bailey MRN: [id number] DOB: [birth year]   Cancelled Treatment:    Reason Eval/Treat Not Completed: (P) Patient at procedure or test/unavailable (pt off floor for I&D) Will continue efforts per PT plan of care as schedule permits.   Dorathy Kinsman Deyra Perdomo 11/08/2022, 4:55 PM

## 2022-11-08 NOTE — Anesthesia Preprocedure Evaluation (Signed)
Anesthesia Evaluation  Patient identified by MRN, date of birth, ID band Patient awake    Reviewed: Allergy & Precautions, H&P , NPO status , Patient's Chart, lab work & pertinent test results  Airway Mallampati: II   Neck ROM: full    Dental   Pulmonary COPD, Current Smoker and Patient abstained from smoking.   breath sounds clear to auscultation       Cardiovascular hypertension, + CAD and + CABG   Rhythm:regular Rate:Normal     Neuro/Psych    GI/Hepatic   Endo/Other  diabetes, Type 2    Renal/GU      Musculoskeletal   Abdominal   Peds  Hematology  (+) Blood dyscrasia, anemia   Anesthesia Other Findings   Reproductive/Obstetrics                             Anesthesia Physical Anesthesia Plan  ASA: 3  Anesthesia Plan: General   Post-op Pain Management:    Induction: Intravenous  PONV Risk Score and Plan: 1 and Ondansetron, Dexamethasone, Treatment may vary due to age or medical condition and Midazolam  Airway Management Planned: LMA  Additional Equipment:   Intra-op Plan:   Post-operative Plan: Extubation in OR  Informed Consent: I have reviewed the patients History and Physical, chart, labs and discussed the procedure including the risks, benefits and alternatives for the proposed anesthesia with the patient or authorized representative who has indicated his/her understanding and acceptance.     Dental advisory given  Plan Discussed with: CRNA, Anesthesiologist and Surgeon  Anesthesia Plan Comments:        Anesthesia Quick Evaluation

## 2022-11-08 NOTE — Interval H&P Note (Signed)
History and Physical Interval Note:  11/08/2022 5:38 PM  Kenneth Bailey  has presented today for surgery, with the diagnosis of abscess.  The various methods of treatment have been discussed with the patient and family. After consideration of risks, benefits and other options for treatment, the patient has consented to  Procedure(s): IRRIGATION AND DEBRIDEMENT OF FOOT AND WASHOUT (Left) as a surgical intervention.  The patient's history has been reviewed, patient examined, no change in status, stable for surgery.  I have reviewed the patient's chart and labs.  Questions were answered to the patient's satisfaction.     Vivi Barrack

## 2022-11-08 NOTE — Progress Notes (Signed)
PROGRESS NOTE  Kenneth Bailey  MVH:[id number] DOB: [birth year] DOA: 10/30/2022 PCP: [name], MD   Brief Narrative: Patient is a 57 year old male with history of uncontrolled diabetes type 2, coronary artery disease status post CABG, chronic combined systolic/diastolic CHF, tobacco abuse who presented with complaint of persistent left foot pain, concern for osteomyelitis of left fifth toe as per recommendation of his podiatrist.  He was found to have a superficial ulceration at the lateral plantar aspect of the fifth metatarsal with underlying early osteomyelitis of the fifth metatarsal head as well as a phlegmon and early abscess fibrillation.  Seen by podiatry, status post left foot incision and drainage with resection of fifth metatarsal head on 6/11 with cultures showed MRSA.  ID, vascular surgery also consulted for concern for peripheral artery disease.  Hospital course was remarkable for nausea, vomiting, hematemesis, GI consulted, underwent EGD and found to have reflux esophagitis, nonbleeding gastric ulcer.  Hospital course remarkable for persistent left foot pain, podiatry planning for checking his wound again in the OR today.  Assessment & Plan:  Principal Problem:   Osteomyelitis (HCC) Active Problems:   Abscess of left foot   Gastric ulcer   Duodenal ulcer   [place] grade D esophagitis  Left fifth toe/lateral foot cellulitis with osteomyelitis: Podiatry following.  Status post left foot I&D and resection of fifth metatarsal on 6/11.  Wound culture showed MRSA.  ID was also consulted, he got few days of vancomycin, cefepime.  Given oritavancin ,now plan for starting doxycycline 11/11/2022 for 14 days. Hospital course remarkable for persistent left foot pain.  Podiatry taking him to the OR again. Has mild leukocytosis  Peripheral artery disease: Vascular surgery was following.  Status post abdominal aortogram, left posterior tibial artery and tibioperoneal trunk  angioplasty.  Currently on aspirin, Plavix.  GI bleed:Hospital course was remarkable for nausea, vomiting, hematemesis, headache stools, GI consulted, underwent EGD and found to have reflux esophagitis, nonbleeding gastric ulcer.  Currently on Protonix.  Currently hemoglobin stable in the range of 9  Chronic combined systolic/diastolic CHF: Currently euvolemic  Tobacco abuse: Continue nicotine patch  Hyperlipidemia: On gemfibrozil,ezitimib  Coronary artery disease status post CABG: No anginal symptoms.  Currently on aspirin, Plavix  Uncontrolled diabetes type 2: Recent A1c of 10.7.  Monitor blood sugars.  Continue current insulin regimen        DVT prophylaxis:SCDs Start: 10/30/22 2211     Code Status: Full Code  Family Communication: None at bedside  Patient status:Inpatient  Patient is from :Home  Anticipated discharge WU:XLKG   Estimated DC date:after clearance from podiatry   Consultants: podiatry,GI,ID ,vascular surgery  Procedures:I and D,EGD,angioplasty  Antimicrobials:  Anti-infectives (From admission, onward)    Start     Dose/Rate Route Frequency Ordered Stop   11/11/22 1000  doxycycline (VIBRA-TABS) tablet 100 mg        100 mg Oral Every 12 hours 11/03/22 1616 11/25/22 0959   11/11/22 0000  doxycycline (VIBRAMYCIN) 100 MG capsule        100 mg Oral 2 times daily 11/07/22 1624 11/25/22 2359   11/04/22 0800  Oritavancin Diphosphate (ORBACTIV) 1,200 mg in dextrose 5 % IVPB        1,200 mg 333.3 mL/hr over 180 Minutes Intravenous Once 11/03/22 1611 11/04/22 1158   11/03/22 0500  vancomycin (VANCOREADY) IVPB 1500 mg/300 mL  Status:  Discontinued        1,500 mg 150 mL/hr over 120 Minutes Intravenous Every 24 hours 11/02/22 1439  11/03/22 1611   11/02/22 1445  ceFEPIme (MAXIPIME) 2 g in sodium chloride 0.9 % 100 mL IVPB  Status:  Discontinued        2 g 200 mL/hr over 30 Minutes Intravenous Every 8 hours 11/02/22 1437 11/03/22 1430   11/02/22 0500   vancomycin (VANCOREADY) IVPB 1250 mg/250 mL  Status:  Discontinued        1,250 mg 166.7 mL/hr over 90 Minutes Intravenous Every 24 hours 11/01/22 0721 11/02/22 1439   11/01/22 0430  vancomycin (VANCOCIN) IVPB 1000 mg/200 mL premix        1,000 mg 200 mL/hr over 60 Minutes Intravenous  Once 11/01/22 0339 11/01/22 0620   11/01/22 0430  ceFEPIme (MAXIPIME) 2 g in sodium chloride 0.9 % 100 mL IVPB  Status:  Discontinued        2 g 200 mL/hr over 30 Minutes Intravenous Every 12 hours 11/01/22 0342 11/02/22 1437   11/01/22 0400  ceFEPIme (MAXIPIME) 2 g in sodium chloride 0.9 % 100 mL IVPB  Status:  Discontinued        2 g 200 mL/hr over 30 Minutes Intravenous Every 24 hours 10/31/22 0342 11/01/22 0341   10/31/22 0430  ceFEPIme (MAXIPIME) 2 g in sodium chloride 0.9 % 100 mL IVPB        2 g 200 mL/hr over 30 Minutes Intravenous  Once 10/31/22 0342 10/31/22 0434   10/30/22 2138  vancomycin variable dose per unstable renal function (pharmacist dosing)  Status:  Discontinued         Does not apply See admin instructions 10/30/22 2138 11/01/22 0721   10/30/22 1745  vancomycin (VANCOREADY) IVPB 1500 mg/300 mL        1,500 mg 150 mL/hr over 120 Minutes Intravenous  Once 10/30/22 1743 10/30/22 2058       Subjective: Patient seen and examined at bedside today.  He was in pain.  Complains of pain in the left foot, rates as 10 / 10.  Objective: Vitals:   11/07/22 2308 11/08/22 0335 11/08/22 0626 11/08/22 0717  BP: (!) 116/57 119/69  118/68  Pulse: 72 62  68  Resp: 17   17  Temp: 98.3 F (36.8 C) 98.2 F (36.8 C)  97.8 F (36.6 C)  TempSrc: Oral Oral  Oral  SpO2: 96% 98%  96%  Weight:   72.5 kg   Height:        Intake/Output Summary (Last 24 hours) at 11/08/2022 0843 Last data filed at 11/08/2022 0717 Gross per 24 hour  Intake 420 ml  Output 4500 ml  Net -4080 ml   Filed Weights   11/07/22 0538 11/07/22 0547 11/08/22 0626  Weight: 76.7 kg 74.6 kg 72.5 kg    Examination:  General  exam: Overall comfortable, not in distress HEENT: PERRL Respiratory system:  no wheezes or crackles  Cardiovascular system: S1 & S2 heard, RRR.  Gastrointestinal system: Abdomen is nondistended, soft and nontender. Central nervous system: Alert and oriented Extremities: No edema, no clubbing ,no cyanosis, left foot covered with dressing Skin: No rashes, no ulcers,no icterus     Data Reviewed: I have personally reviewed following labs and imaging studies  CBC: Recent Labs  Lab 11/04/22 1450 11/04/22 1943 11/05/22 0547 11/05/22 1420 11/06/22 0153 11/06/22 0834 11/06/22 1753 11/06/22 2333 11/07/22 0136 11/07/22 0733 11/08/22 0109  WBC 17.5*  --  13.0*  --  11.4*  --   --   --  13.2*  --  14.4*  NEUTROABS 14.9*  --  9.7*  --  8.5*  --   --   --  9.2*  --  11.8*  HGB 7.0*   < > 9.0*   < > 7.1*   < > 8.9* 8.6* 9.7* 9.9* 9.1*  HCT 21.0*   < > 25.7*   < > 21.2*   < > 26.1* 25.0* 28.2* 29.4* 26.9*  MCV 84.7  --  82.4  --  85.1  --   --   --  84.4  --  83.3  PLT 374  --  248  --  225  --   --   --  272  --  298   < > = values in this interval not displayed.   Basic Metabolic Panel: Recent Labs  Lab 11/04/22 0123 11/04/22 1450 11/05/22 0547 11/06/22 0153 11/07/22 0136 11/08/22 0109  NA 133* 132* 135 132* 132* 130*  K 4.1 4.5 4.1 4.2 4.4 4.4  CL 97* 97* 98 99 95* 95*  CO2 24 23 21* 23 26 24   GLUCOSE 211* 325* 215* 294* 177* 288*  BUN 39* 59* 58* 40* 27* 24*  CREATININE 1.03 1.12 1.08 1.29* 1.04 1.22  CALCIUM 9.1 9.2 8.5* 8.2* 9.0 9.1  MG 1.5*  --  1.7 1.7 1.9 1.7  PHOS 3.7  --  3.2 3.5 4.0 3.7     Recent Results (from the past 240 hour(s))  Blood culture (routine x 2)     Status: None   Collection Time: 10/30/22  4:58 PM   Specimen: BLOOD  Result Value Ref Range Status   Specimen Description BLOOD LEFT FOREARM  Final   Special Requests   Final    BOTTLES DRAWN AEROBIC AND ANAEROBIC Blood Culture adequate volume   Culture   Final    NO GROWTH 5 DAYS Performed at  [facility], [address], [place], [place] [address]    Report Status 11/04/2022 FINAL  Final  Blood culture (routine x 2)     Status: None   Collection Time: 10/30/22  5:11 PM   Specimen: BLOOD  Result Value Ref Range Status   Specimen Description BLOOD RIGHT ANTECUBITAL  Final   Special Requests   Final    BOTTLES DRAWN AEROBIC AND ANAEROBIC Blood Culture results may not be optimal due to an excessive volume of blood received in culture bottles   Culture   Final    NO GROWTH 5 DAYS Performed at [facility], [address] [place], [place] [address]    Report Status 11/04/2022 FINAL  Final  MRSA Next Gen by PCR, Nasal     Status: None   Collection Time: 10/31/22  3:44 AM   Specimen: Nasal Mucosa; Nasal Swab  Result Value Ref Range Status   MRSA by PCR Next Gen NOT DETECTED NOT DETECTED Final    Comment: (NOTE) The GeneXpert MRSA Assay (FDA approved for NASAL specimens only), is one component of a comprehensive MRSA colonization surveillance program. It is not intended to diagnose MRSA infection nor to guide or monitor treatment for MRSA infections. Test performance is not FDA approved in patients less than 51 years old. Performed at [facility], [address], [place], [place] [address]   Aerobic/Anaerobic Culture w Gram Stain (surgical/deep wound)     Status: None   Collection Time: 10/31/22  1:24 PM   Specimen: Toe, Left; Amputation  Result Value Ref Range Status   Specimen Description WOUND LEFT TOE  Final  Special Requests 5TH METATARSAL HEAD PT ON VANC CEFEPIME  Final   Gram Stain   Final    NO WBC SEEN RARE GRAM POSITIVE COCCI IN PAIRS RARE GRAM NEGATIVE RODS    Culture   Final    FEW METHICILLIN RESISTANT STAPHYLOCOCCUS AUREUS NO ANAEROBES ISOLATED Performed at [facility], [address], [place], [place] [address]    Report Status 11/05/2022 FINAL  Final   Organism ID, Bacteria METHICILLIN RESISTANT STAPHYLOCOCCUS AUREUS  Final       Susceptibility   Methicillin resistant staphylococcus aureus - MIC*    CIPROFLOXACIN >=8 RESISTANT Resistant     ERYTHROMYCIN >=8 RESISTANT Resistant     GENTAMICIN <=0.5 SENSITIVE Sensitive     OXACILLIN >=4 RESISTANT Resistant     TETRACYCLINE <=1 SENSITIVE Sensitive     VANCOMYCIN <=0.5 SENSITIVE Sensitive     TRIMETH/SULFA 160 RESISTANT Resistant     CLINDAMYCIN >=8 RESISTANT Resistant     RIFAMPIN <=0.5 SENSITIVE Sensitive     Inducible Clindamycin NEGATIVE Sensitive     LINEZOLID 2 SENSITIVE Sensitive     * FEW METHICILLIN RESISTANT STAPHYLOCOCCUS AUREUS  Surgical pcr screen     Status: None   Collection Time: 11/03/22  7:07 AM   Specimen: Nasal Mucosa; Nasal Swab  Result Value Ref Range Status   MRSA, PCR NEGATIVE NEGATIVE Final   Staphylococcus aureus NEGATIVE NEGATIVE Final    Comment: (NOTE) The Xpert SA Assay (FDA approved for NASAL specimens in patients 43 years of age and older), is one component of a comprehensive surveillance program. It is not intended to diagnose infection nor to guide or monitor treatment. Performed at [facility], [address] [place], [place] [address]   Culture, blood (x 2)     Status: None (Preliminary result)   Collection Time: 11/04/22  2:50 PM   Specimen: BLOOD  Result Value Ref Range Status   Specimen Description BLOOD SITE NOT SPECIFIED  Final   Special Requests   Final    BOTTLES DRAWN AEROBIC AND ANAEROBIC Blood Culture results may not be optimal due to an inadequate volume of blood received in culture bottles   Culture   Final    NO GROWTH 4 DAYS Performed at [facility], [address], [place], [place] [address]    Report Status PENDING  Incomplete  Culture, blood (x 2)     Status: None (Preliminary result)   Collection Time: 11/04/22  2:54 PM   Specimen: BLOOD  Result Value Ref Range Status   Specimen Description BLOOD BLOOD RIGHT HAND  Final   Special Requests   Final    BOTTLES DRAWN AEROBIC AND  ANAEROBIC Blood Culture results may not be optimal due to an inadequate volume of blood received in culture bottles   Culture   Final    NO GROWTH 4 DAYS Performed at [facility], [address] [place], [place] [address]    Report Status PENDING  Incomplete     Radiology Studies: DG Foot Complete Left  Result Date: 11/07/2022 CLINICAL DATA:  Osteomyelitis of foot EXAM: LEFT FOOT - COMPLETE 3+ VIEW COMPARISON:  10/31/2022, 10/30/2022 FINDINGS: Resection of distal fifth metatarsal. Stable alignment fifth digit. Cut margin remains smooth. No osseous destructive change. Increased air within the soft tissues at the surgical site. IMPRESSION: Status post resection of distal fifth metatarsal. Increased air collection within the soft tissues at the surgical site. Electronically Signed   By: Selena Batten  Jake Samples M.D.   On: 11/07/2022 21:07    Scheduled Meds:  aspirin EC  81 mg Oral Daily   clopidogrel  75 mg Oral Daily   [START ON 11/11/2022] doxycycline  100 mg Oral Q12H   ezetimibe  10 mg Oral Daily   gemfibrozil  600 mg Oral BID AC   insulin aspart  0-5 Units Subcutaneous QHS   insulin aspart  0-9 Units Subcutaneous TID WC   insulin glargine-yfgn  10 Units Subcutaneous Daily   melatonin  10 mg Oral QHS   nicotine  14 mg Transdermal Daily   pantoprazole  40 mg Intravenous Q12H   sodium chloride flush  3 mL Intravenous Q12H   Continuous Infusions:  sodium chloride       LOS: 9 days   Burnadette Pop, MD [facility] P6/19/2024, 8:43 AM

## 2022-11-08 NOTE — Anesthesia Procedure Notes (Signed)
Procedure Name: LMA Insertion Date/Time: 11/08/2022 5:49 PM  Performed by: Audie Pinto, CRNAPre-anesthesia Checklist: Patient identified, Emergency Drugs available, Suction available and Patient being monitored Patient Re-evaluated:Patient Re-evaluated prior to induction Oxygen Delivery Method: Circle system utilized Preoxygenation: Pre-oxygenation with 100% oxygen Induction Type: IV induction LMA: LMA inserted LMA Size: 4.0 Placement Confirmation: positive ETCO2 Dental Injury: Teeth and Oropharynx as per pre-operative assessment

## 2022-11-08 NOTE — Progress Notes (Signed)
Patient received from PACU via bed, AO x4. Vitals stable at the time of transfer. Connected to tele and CCMD notified. Call bell within reach. Left foot wrapped in ACE wrap, elevated, no drainage noted. Plan of care continues.

## 2022-11-08 NOTE — TOC Progression Note (Signed)
Transition of Care (TOC) - Progression Note  Donn Pierini RN, BSN Transitions of Care Unit 4E- RN Case Manager See Treatment Team for direct phone #   Patient Details  Name: Kenneth Bailey MRN: [id number] Date of Birth: 1965-08-12  Transition of Care [facility]) CM/SW Contact  Zenda Alpers, Lenn Sink, RN Phone Number: 11/08/2022, 11:44 AM  Clinical Narrative:    Received notice from bedside RN that pt was for d/c, HH orders placed for RN/PT/OT. CM in to speak with pt at bedside- discussed HH needs and choice offered- Per CMS guidelines from [email or web address] website with star ratings (copy placed in shadow chart)-  In discussion with pt - pt voiced that he plans to drive himself home- note pt has been taking pain medication today- and that this would need to be cleared with MD as he can not drive on pain medication.  Pt also voiced that he had not been up walking yet- and not in PT note from 6/17 that pt was unable to tolerate ambulation due to 10/10 pain.  CM has concerns about patient safely discharging today without walking and going home alone.  Msg sent to MD and discharge has been canceled for now- plan for pt to work again with PT/OT tomorrow to see if pt can ambulate safely prior to return home.   Pt voiced that he would like to see if [facility] can service for [facility] needs, CM will follow for medical readiness and make referral, Pt also will need DME- RW.  Bedside RN aware that pt will need education on wound care prior to discharge as well- as CM has explained to pt that [facility] will not be coming daily for wound care.    Expected Discharge Plan: Home w Home Health Services Barriers to Discharge: Continued Medical Work up  Expected Discharge Plan and Services   Discharge Planning Services: CM Consult Post Acute Care Choice: Durable Medical Equipment, Home Health Living arrangements for the past 2 months: Mobile Home Expected Discharge Date: 11/07/22               DME Arranged: Dan Humphreys  rolling         HH Arranged: RN, PT, OT           Social Determinants of Health (SDOH) Interventions SDOH Screenings   Food Insecurity: No Food Insecurity (10/30/2022)  Housing: Low Risk  (10/30/2022)  Transportation Needs: No Transportation Needs (10/30/2022)  Utilities: Not At Risk (10/30/2022)  Financial Resource Strain: High Risk (01/09/2022)  Tobacco Use: High Risk (11/08/2022)    Readmission Risk Interventions     No data to display

## 2022-11-08 NOTE — Brief Op Note (Signed)
11/08/2022  6:41 PM  PATIENT:  Kenneth Bailey  57 y.o. male  PRE-OPERATIVE DIAGNOSIS:  abscess  POST-OPERATIVE DIAGNOSIS:  abscess  PROCEDURE:  Procedure(s): IRRIGATION AND DEBRIDEMENT OF FOOT AND WASHOUT (Left)  SURGEON:  Surgeon(s) and Role:    * Vivi Barrack, DPM - Primary  PHYSICIAN ASSISTANT:   ASSISTANTS: none   ANESTHESIA:   none  EBL:  50 mL   BLOOD ADMINISTERED:none  DRAINS: none   LOCAL MEDICATIONS USED:  MARCAINE    and BUPIVICAINE   SPECIMEN:  Source of Specimen:  culture abscess, 5th metatarsal for path/micro  DISPOSITION OF SPECIMEN:  PATHOLOGY  COUNTS:  YES  TOURNIQUET:  * No tourniquets in log *  DICTATION: .Dragon Dictation  PLAN OF CARE: Admit to inpatient   PATIENT DISPOSITION:  PACU - hemodynamically stable.   Delay start of Pharmacological VTE agent (>24hrs) due to surgical blood loss or risk of bleeding: no  Intraoperative findings: He had quite a bit of necrosis laterally which I debrided to what appeared healthy tissue. Resected some more 5th metatarsal and sent to path micro. I am concerned about his healing. He is at risk of further amputation. Will continue to monitor.

## 2022-11-08 NOTE — Progress Notes (Signed)
WBC elevated today to 14.4. Repeat x-ray shows increased air but this is after I have opened one of the sutures and irrigated the wound which likely corresponds to this. I will plan on surgery tonight after 5pm pending OR availability. I will be by to discuss with the patient early this afternoon. For now will start NPO at 9am in anticipation of surgery tonight.   Surgery will entail debridement of the wound.

## 2022-11-09 ENCOUNTER — Encounter (HOSPITAL_COMMUNITY): Payer: Self-pay | Admitting: Podiatry

## 2022-11-09 DIAGNOSIS — M86172 Other acute osteomyelitis, left ankle and foot: Secondary | ICD-10-CM | POA: Diagnosis not present

## 2022-11-09 LAB — BASIC METABOLIC PANEL
Anion gap: 11 (ref 5–15)
BUN: 32 mg/dL — ABNORMAL HIGH (ref 6–20)
CO2: 25 mmol/L (ref 22–32)
Calcium: 9.3 mg/dL (ref 8.9–10.3)
Chloride: 93 mmol/L — ABNORMAL LOW (ref 98–111)
Creatinine, Ser: 1.53 mg/dL — ABNORMAL HIGH (ref 0.61–1.24)
GFR, Estimated: 53 mL/min — ABNORMAL LOW (ref >60)
Glucose, Bld: 269 mg/dL — ABNORMAL HIGH (ref 70–99)
Potassium: 5.8 mmol/L — ABNORMAL HIGH (ref 3.5–5.1)
Sodium: 129 mmol/L — ABNORMAL LOW (ref 135–145)

## 2022-11-09 LAB — CBC
HCT: 29.5 % — ABNORMAL LOW (ref 39.0–52.0)
Hemoglobin: 9.6 g/dL — ABNORMAL LOW (ref 13.0–17.0)
MCH: 28.7 pg (ref 26.0–34.0)
MCHC: 32.5 g/dL (ref 30.0–36.0)
MCV: 88.1 fL (ref 80.0–100.0)
Platelets: 336 10*3/uL (ref 150–400)
RBC: 3.35 MIL/uL — ABNORMAL LOW (ref 4.22–5.81)
RDW: 14.6 % (ref 11.5–15.5)
WBC: 19.7 10*3/uL — ABNORMAL HIGH (ref 4.0–10.5)
nRBC: 0 % (ref 0.0–0.2)

## 2022-11-09 LAB — CULTURE, BLOOD (ROUTINE X 2)

## 2022-11-09 LAB — GLUCOSE, CAPILLARY
Glucose-Capillary: 238 mg/dL — ABNORMAL HIGH (ref 70–99)
Glucose-Capillary: 204 mg/dL — ABNORMAL HIGH (ref 70–99)
Glucose-Capillary: 310 mg/dL — ABNORMAL HIGH (ref 70–99)
Glucose-Capillary: 278 mg/dL — ABNORMAL HIGH (ref 70–99)
Glucose-Capillary: 254 mg/dL — ABNORMAL HIGH (ref 70–99)

## 2022-11-09 LAB — AEROBIC/ANAEROBIC CULTURE W GRAM STAIN (SURGICAL/DEEP WOUND): Gram Stain: NONE SEEN

## 2022-11-09 MED ORDER — PANTOPRAZOLE SODIUM 40 MG PO TBEC
40.0000 mg | DELAYED_RELEASE_TABLET | Freq: Two times a day (BID) | ORAL | Status: DC
  Administered 2022-11-09 – 2022-11-17 (×16): 40 mg via ORAL
  Filled 2022-11-09 (×17): qty 1

## 2022-11-09 MED ORDER — AMOXICILLIN-POT CLAVULANATE 875-125 MG PO TABS
1.0000 | ORAL_TABLET | Freq: Two times a day (BID) | ORAL | Status: DC
  Administered 2022-11-09 – 2022-11-17 (×17): 1 via ORAL
  Filled 2022-11-09 (×17): qty 1

## 2022-11-09 MED ORDER — SODIUM ZIRCONIUM CYCLOSILICATE 10 G PO PACK
10.0000 g | PACK | Freq: Once | ORAL | Status: AC
  Administered 2022-11-09: 10 g via ORAL
  Filled 2022-11-09: qty 1

## 2022-11-09 MED ORDER — MORPHINE SULFATE (PF) 2 MG/ML IV SOLN
2.0000 mg | Freq: Once | INTRAVENOUS | Status: AC
  Administered 2022-11-09: 2 mg via INTRAVENOUS
  Filled 2022-11-09: qty 1

## 2022-11-09 MED ORDER — AMOXICILLIN-POT CLAVULANATE 875-125 MG PO TABS: 1.0000 | ORAL_TABLET | Freq: Two times a day (BID) | ORAL | 0 refills | Status: DC

## 2022-11-09 MED ORDER — INSULIN GLARGINE-YFGN 100 UNIT/ML ~~LOC~~ SOLN
15.0000 [IU] | Freq: Every day | SUBCUTANEOUS | Status: DC
  Administered 2022-11-10 – 2022-11-13 (×4): 15 [IU] via SUBCUTANEOUS
  Filled 2022-11-09 (×4): qty 0.15

## 2022-11-09 MED ORDER — SODIUM CHLORIDE 0.9 % IV SOLN: INTRAVENOUS | Status: DC

## 2022-11-09 MED ORDER — INSULIN GLARGINE-YFGN 100 UNIT/ML ~~LOC~~ SOLN
5.0000 [IU] | Freq: Once | SUBCUTANEOUS | Status: AC
  Administered 2022-11-09: 5 [IU] via SUBCUTANEOUS
  Filled 2022-11-09: qty 0.05

## 2022-11-09 MED ORDER — DOXYCYCLINE HYCLATE 100 MG PO CAPS: 100.0000 mg | ORAL_CAPSULE | Freq: Two times a day (BID) | ORAL | 0 refills | Status: AC

## 2022-11-09 NOTE — Progress Notes (Signed)
PT Cancellation Note  Patient Details Name: Oziel Bulla MRN: [id number] DOB: 14-Apr-1966   Cancelled Treatment:    Reason Eval/Treat Not Completed: (P) Other (comment) (MD placed PT re-order however pt states he is NWB, no new WB orders in the system, MD messaged for clarification. Defer PT session until WB status clarified in case pt needs re-eval and goal change.)   Angus Palms 11/09/2022, 12:32 PM

## 2022-11-09 NOTE — Progress Notes (Signed)
Orthopedic Tech Progress Note Patient Details:  Kenneth Bailey [facility] 1965/07/31 [id number]  Darco shoe delivered to pt room. He did not need it on at this time as he is in bed.  Ortho Devices Type of Ortho Device: Darco shoe Ortho Device/Splint Location: for LLE Ortho Device/Splint Interventions: Ordered      Docia Furl 11/09/2022, 8:31 PM

## 2022-11-09 NOTE — Progress Notes (Signed)
PODIATRY PROGRESS NOTE  NAME Kenneth Bailey MRN [id number] DOB 03/24/1966 DOA 10/30/2022   Reason for consult:  Chief Complaint  Patient presents with   Wound Infection     History of present illness: 57 y.o. male POD # 1 s/p return to the OR for I&D, wound debridement.  States he been doing well.  Minimal discomfort of the foot today.  No fever or chills.  Vitals:   11/09/22 1255 11/09/22 1611  BP: (!) 143/60 (!) 145/47  Pulse:  78  Resp: 19   Temp: 98.3 F (36.8 C) 98.5 F (36.9 C)  SpO2:  98%       Latest Ref Rng & Units 11/09/2022    1:20 AM 11/08/2022    1:09 AM 11/07/2022    7:33 AM  CBC  WBC 4.0 - 10.5 K/uL 19.7  14.4    Hemoglobin 13.0 - 17.0 g/dL 9.6  9.1  9.9   Hematocrit 39.0 - 52.0 % 29.5  26.9  29.4   Platelets 150 - 400 K/uL 336  298         Latest Ref Rng & Units 11/09/2022    1:20 AM 11/08/2022    1:09 AM 11/07/2022    1:36 AM  BMP  Glucose 70 - 99 mg/dL 865  784  696   BUN 6 - 20 mg/dL 32  24  27   Creatinine 0.61 - 1.24 mg/dL 2.95  2.84  1.32   Sodium 135 - 145 mmol/L 129  130  132   Potassium 3.5 - 5.1 mmol/L 5.8  4.4  4.4   Chloride 98 - 111 mmol/L 93  95  95   CO2 22 - 32 mmol/L 25  24  26    Calcium 8.9 - 10.3 mg/dL 9.3  9.1  9.0       Physical Exam: General: AAOx3, NAD  Dermatology: Status post left fifth metatarsal head excision with sutures intact and central area packed open.  Cellulitis compared to previous has much improved.  There is no purulence noted today.  There is no fluctuation or crepitation.     Vascular: Unable to palpate pulse  Neurological: Sensation decreased  Musculoskeletal Exam: Mild pain on exam    ASSESSMENT/PLAN OF CARE  POD #1 s/p left 1 debridement, I&D  -White blood cell count increased further today, possibly in response from surgery and need to monitor. ID has been asked for a re consult today by Dr. Renford Dills.  -Dressing changed today.  I irrigated with saline.  I repacked with a corner of  saline moistened 4 x 4 followed by Betadine on the incision, dry sterile dressing. -Nonweightbearing except for heel for transfer. -Elevation -Discussed with the patient concerning about the healing given circulation although he did have bloody drainage on the bandage today when changing this.  Will need to monitor closely.  He has been seen by vascular surgery this admission a s well.      Please contact me directly with any questions or concerns.     Ovid Curd, DPM [facility]  Dr. Lesia Sago. Llewellyn Choplin, DPM    [address], [place] [address]  Office [phone]  Fax [phone]

## 2022-11-09 NOTE — Inpatient Diabetes Management (Signed)
Inpatient Diabetes Program Recommendations  AACE/ADA: New Consensus Statement on Inpatient Glycemic Control (2015)  Target Ranges:  Prepandial:   less than 140 mg/dL      Peak postprandial:   less than 180 mg/dL (1-2 hours)      Critically ill patients:  140 - 180 mg/dL   Lab Results  Component Value Date   GLUCAP 204 (H) 11/09/2022   HGBA1C 10.7 (H) 10/11/2022    Review of Glycemic Control  Latest Reference Range & Units 11/08/22 21:14 11/09/22 05:47 11/09/22 08:25  Glucose-Capillary 70 - 99 mg/dL 696 (H) 295 (H) 284 (H)  (H): Data is abnormally high  Diabetes history: DM2 Outpatient Diabetes medications: Semglee 12-50 units BID, s/s, metformin 1000 mg BID, Farxiga 10 QD Current orders for Inpatient glycemic control: Semglee 10 every day, Novolog 0-9 units TID with meals Decadron 4 mg x 1  HgbA1C - 10.7%   Inpatient Diabetes Program Recommendations:    Consider increasing Semglee 15 units every day  Thanks, Lujean Rave, MSN, RNC-OB Diabetes Coordinator [phone] (8a-5p)

## 2022-11-09 NOTE — Op Note (Signed)
PATIENT:  Kenneth Bailey  57 y.o. male   PRE-OPERATIVE DIAGNOSIS:  abscess   POST-OPERATIVE DIAGNOSIS:  abscess   PROCEDURE:  Procedure(s): IRRIGATION AND DEBRIDEMENT OF FOOT AND WASHOUT (Left)   SURGEON:  Surgeon(s) and Role:    * Vivi Barrack, DPM - Primary   PHYSICIAN ASSISTANT:    ASSISTANTS: none    ANESTHESIA:   none   EBL:  50 mL    BLOOD ADMINISTERED:none   DRAINS: none    LOCAL MEDICATIONS USED:  MARCAINE    and BUPIVICAINE    SPECIMEN:  Source of Specimen:  culture abscess, 5th metatarsal for path/micro   DISPOSITION OF SPECIMEN:  PATHOLOGY   COUNTS:  YES   TOURNIQUET:  * No tourniquets in log *   DICTATION: .Dragon Dictation   PLAN OF CARE: Admit to inpatient    PATIENT DISPOSITION:  PACU - hemodynamically stable.   Delay start of Pharmacological VTE agent (>24hrs) due to surgical blood loss or risk of bleeding: no   Intraoperative findings: He had quite a bit of necrosis laterally which I debrided to what appeared healthy tissue. Resected some more 5th metatarsal and sent to path micro. I am concerned about his healing. He is at risk of further amputation. Will continue to monitor.   Indications for surgery: Patient presents to the hospital from clinic given the infection on his left foot.  He presented with fifth metatarsal head excision with Dr. Ralene Cork.  However the wound continues to progress and become necrotic and is likely cell count elevated.  Due to this recommend return to the OR for further debridement, washout, I&D.  We discussed risks of surgery including spread of infection with further amputation as well as general risk of surgery which she understands.  Alternatives, risks, complications were discussed.  No promises guarantee.  Patient is comfortably, visually identified by myself and nursing staff and anesthesia staff preoperatively.  He was then transferred to the operating room via stretcher and placed on the operating table in  supine position.  After adequate anesthesia was obtained the left lower extremities and scrubbed prepped and draped in normal sterile fashion.  Secondary timeout was performed.  Initially I utilized 20 mL of lidocaine, Marcaine plain in a regional block fashion.  Attention was then directed along the area of an eschar, necrotic tissue on the fifth metatarsal head and this I utilized a 15 blade to circumferentially excise this entire area.  There was thick, bloody drainage underneath this as well as hematoma which is easily able to identify.  The fifth metatarsal distally did appear to be somewhat darkened in color as well.  I freed the soft tissue from this area.  I took a wound culture as well of some mild purulence was noted underneath the eschar.  I then utilized a sagittal saw to resect the distal portion of the remaining fifth metatarsal and sent this to both pathology as well as microbiology.  I further debrided the wound down to healthy, bleeding tissue.  I copiously irrigated with saline with pulse lavage.  Upon further inspection there is no purulence noted no tracking medially or proximally.  I was able to close the joint incision with Prolene.  At the central area packed open with a corner of a 4 x 4 moistened gauze followed by dry sterile dressing along the incision.  He was awoken from anesthesia and found to have tolerate the procedure well any complications.  Transferred to PACU Stable Vascular Status Intact.  Postoperative course: To remain inpatient on antibiotics.  Nonweightbearing except for heels for transfer.  We need to monitor this closely I discussed with the patient as well as his sister if this continues to become necrotic and not reveal there is a chance of proximal potation.  He is aware of this.

## 2022-11-09 NOTE — Progress Notes (Addendum)
Physical Therapy Treatment Patient Details Name: Kenneth Bailey MRN: [id number] DOB: 1966-02-03 Today's Date: 11/09/2022   History of Present Illness Pt is a 57 y.o. male admitted on 10/30/2022 with osteomyelitis. He is now s/p Left foot irrigation and debridement with resection of fifth metatarsal head 11/01/22. Pt underwent repeat I&D on 11/08/22. Past medical history significant for uncontrolled type 2 diabetes, diabetic polyneuropathy, coronary artery disease status post CABG, chronic combined diastolic and systolic CHF, current smoker,    PT Comments    Pt now s/p another I&D of lt foot. Pt continues to only tolerate minimal OOB activity due to pain and reports of dizziness. Pt attempted amb with crutches but only able to go a few steps before rushing to hop backwards to sit down due to pain and dizziness. Pt was maintaining NWB on LLE. Did sitting and standing BP's next but no drop in BP. BP went up in standing (?due to pain or do to inaccurate reading). Pt lives alone and does not have support and has been unable to manage basic in room ambulation and activity. Updated dc recommendation to continued post acute follow up therapy, <3 hours/day. Updated goals.     Recommendations for follow up therapy are one component of a multi-disciplinary discharge planning process, led by the attending physician.  Recommendations may be updated based on patient status, additional functional criteria and insurance authorization.  Follow Up Recommendations  Can patient physically be transported by private vehicle: Yes    Assistance Recommended at Discharge Frequent or constant Supervision/Assistance  Patient can return home with the following A lot of help with walking and/or transfers;A little help with bathing/dressing/bathroom;Help with stairs or ramp for entrance;Assistance with cooking/housework   Equipment Recommendations  None recommended by PT    Recommendations for Other Services        Precautions / Restrictions Precautions Precautions: Fall Required Braces or Orthoses: Other Brace Other Brace: Wedged post op shoe Restrictions Weight Bearing Restrictions: Yes LLE Weight Bearing: Non weight bearing (except PWB through heel for transfers)     Mobility  Bed Mobility Overal bed mobility: Modified Independent Bed Mobility: Supine to Sit, Sit to Supine     Supine to sit: Modified independent (Device/Increase time) Sit to supine: Modified independent (Device/Increase time)        Transfers Overall transfer level: Needs assistance Equipment used: Crutches Transfers: Sit to/from Stand Sit to Stand: Min assist, +2 safety/equipment           General transfer comment: Assist for stability and to get crutches placed correctly    Ambulation/Gait Ambulation/Gait assistance: Min assist, +2 safety/equipment Gait Distance (Feet): 3 Feet (forwards/backwards) Assistive device: Crutches Gait Pattern/deviations: Step-to pattern, Trunk flexed (hop to) Gait velocity: decr Gait velocity interpretation: <1.31 ft/sec, indicative of household ambulator   General Gait Details: Assist for stability. Pt hopped forward several feet and then impulsively began hopping backward without warning to sit back on EOB. Pt reported pain and dizziness and that he had to sit back down. Was able to maintain NWB on lt foot   Stairs             Wheelchair Mobility    Modified Rankin (Stroke Patients Only)       Balance Overall balance assessment: Needs assistance Sitting-balance support: No upper extremity supported, Feet supported Sitting balance-Leahy Scale: Good     Standing balance support: Bilateral upper extremity supported, During functional activity, Reliant on assistive device for balance Standing balance-Leahy Scale: Poor Standing balance comment:  reliant on UE support (crutches) due to NWB on lt                            Cognition  Arousal/Alertness: Awake/alert Behavior During Therapy: Impulsive Overall Cognitive Status: Impaired/Different from baseline Area of Impairment: Safety/judgement                         Safety/Judgement: Decreased awareness of safety, Decreased awareness of deficits              Exercises      General Comments General comments (skin integrity, edema, etc.): After amb attempt took sitting and standing BP. Pt denied dizziness in standing and no drop in BP. Question accuracy of elevated BP reading in standing due to BP on rt calf.      Pertinent Vitals/Pain Pain Assessment Pain Assessment: 0-10 Pain Score: 10-Worst pain ever Pain Location: L foot and lt wrist (site of IV infiltration) Pain Descriptors / Indicators: Pounding, Throbbing Pain Intervention(s): Premedicated before session, Repositioned, Monitored during session, Limited activity within patient's tolerance, Patient requesting pain meds-RN notified    Home Living                          Prior Function            PT Goals (current goals can now be found in the care plan section) Acute Rehab PT Goals PT Goal Formulation: With patient Time For Goal Achievement: 11/23/22 Potential to Achieve Goals: Good Progress towards PT goals: Not progressing toward goals - comment;Goals downgraded-see care plan    Frequency    Min 1X/week      PT Plan Discharge plan needs to be updated    Co-evaluation PT/OT/SLP Co-Evaluation/Treatment: Yes Reason for Co-Treatment: For patient/therapist safety;Other (comment) (Pt's limited tolerance to actitivity due to pain)          AM-PAC PT "6 Clicks" Mobility   Outcome Measure  Help needed turning from your back to your side while in a flat bed without using bedrails?: None Help needed moving from lying on your back to sitting on the side of a flat bed without using bedrails?: None Help needed moving to and from a bed to a chair (including a  wheelchair)?: A Lot Help needed standing up from a chair using your arms (e.g., wheelchair or bedside chair)?: A Little Help needed to walk in hospital room?: Total Help needed climbing 3-5 steps with a railing? : Total 6 Click Score: 15    End of Session Equipment Utilized During Treatment: Gait belt Activity Tolerance: Patient limited by pain Patient left: in bed;with call bell/phone within reach;with bed alarm set Nurse Communication: Mobility status;Patient requests pain meds PT Visit Diagnosis: Other abnormalities of gait and mobility (R26.89);Pain Pain - Right/Left: Left Pain - part of body: Ankle and joints of foot     Time: 4098-1191 PT Time Calculation (min) (ACUTE ONLY): 14 min  Charges:  $Therapeutic Activity: 8-22 mins                     [facility] PT Acute Rehabilitation Services Office [phone]    Angelina Ok [facility] 11/09/2022, 4:32 PM

## 2022-11-09 NOTE — Plan of Care (Signed)

## 2022-11-09 NOTE — Progress Notes (Signed)
Occupational Therapy Treatment Patient Details Name: Kenneth Bailey MRN: [id number] DOB: 1965/12/24 Today's Date: 11/09/2022   History of present illness Pt is a 57 y.o. male admitted on 10/30/2022 with osteomyelitis. He is now s/p Left foot irrigation and debridement with resection of fifth metatarsal head 11/01/22. Pt underwent repeat I&D on 11/08/22. Past medical history significant for uncontrolled type 2 diabetes, diabetic polyneuropathy, coronary artery disease status post CABG, chronic combined diastolic and systolic CHF, current smoker,   OT comments  This 57 yo male seen today due to now post surgery yesterday his WB'ing status has changed and it needed to be determined if follow up recommendations were the same. They have changed from no follow up to continued inpatient follow up therapy, <3 hours/day due to pain, dizziness and decreased with the use of crutches for NWB for ambulation. He will continue to benefit from acute OT.    Recommendations for follow up therapy are one component of a multi-disciplinary discharge planning process, led by the attending physician.  Recommendations may be updated based on patient status, additional functional criteria and insurance authorization.    Assistance Recommended at Discharge Frequent or constant Supervision/Assistance  Patient can return home with the following  A lot of help with walking and/or transfers;A lot of help with bathing/dressing/bathroom;Assistance with cooking/housework;Assist for transportation;Help with stairs or ramp for entrance   Equipment Recommendations  Other (comment) (TBD next venue)       Precautions / Restrictions Precautions Precautions: Fall Required Braces or Orthoses: Other Brace Other Brace: Wedged post op shoe Restrictions Weight Bearing Restrictions: Yes LLE Weight Bearing: Non weight bearing (except PWB through heel for transfers)       Mobility Bed Mobility Overal bed mobility: Modified  Independent                  Transfers Overall transfer level: Needs assistance Equipment used: Crutches Transfers: Sit to/from Stand Sit to Stand: Min assist, +2 safety/equipment           General transfer comment: Assist for stability and to get crutches placed correctly     Balance Overall balance assessment: Needs assistance Sitting-balance support: No upper extremity supported, Feet supported Sitting balance-Leahy Scale: Good     Standing balance support: Bilateral upper extremity supported, During functional activity, Reliant on assistive device for balance Standing balance-Leahy Scale: Poor Standing balance comment: reliant on UE support (crutches) due to NWB on lt                           ADL either performed or assessed with clinical judgement   ADL Overall ADL's : Needs assistance/impaired                     Lower Body Dressing: Set up;Supervision/safety Lower Body Dressing Details (indicate cue type and reason): sitting EOB donning right sock and shoe Toilet Transfer: Minimal assistance;+2 for safety/equipment Toilet Transfer Details (indicate cue type and reason): crutches; simulated from bed-5 feet forward --then impulsively 5 feet backwards with pt reports too painful and dizzy                Extremity/Trunk Assessment Upper Extremity Assessment Upper Extremity Assessment: LUE deficits/detail LUE Deficits / Details: left wrist painful from IV infiltrate-so not able to use in on crutches as he would like            Vision Patient Visual Report: No change from baseline  Cognition Arousal/Alertness: Awake/alert Behavior During Therapy: Impulsive Overall Cognitive Status: Impaired/Different from baseline Area of Impairment: Safety/judgement                         Safety/Judgement: Decreased awareness of safety, Decreased awareness of deficits   Problem Solving: Decreased initiation General  Comments: limited by pain              General Comments After amb attempt took sitting and standing BP. Pt denied dizziness in standing and no drop in BP. Question accuracy of elevated BP reading in standing due to BP on rt calf.    Pertinent Vitals/ Pain       Pain Assessment Pain Assessment: 0-10 Pain Score: 10-Worst pain ever Pain Location: L foot and lt wrist (site of IV infiltration) Pain Descriptors / Indicators: Pounding, Throbbing Pain Intervention(s): Limited activity within patient's tolerance, Monitored during session, Premedicated before session, Repositioned, Patient requesting pain meds-RN notified, Heat applied (heat to left wrist where IV infiltrated)         Frequency  Min 2X/week        Progress Toward Goals  OT Goals(current goals can now be found in the care plan section)  Progress towards OT goals: Not progressing toward goals - comment (increased pain today in LLE and in left wrist)  Acute Rehab OT Goals Patient Stated Goal: really wants to go home,but realizes it is not feasible at this time to do so by himself OT Goal Formulation: With patient Time For Goal Achievement: 11/15/22 Potential to Achieve Goals: Good  Plan Discharge plan remains appropriate    Co-evaluation    PT/OT/SLP Co-Evaluation/Treatment: Yes (partial) Reason for Co-Treatment: For patient/therapist safety (Pt's limited tolerance to actitivity due to pain) PT goals addressed during session: Mobility/safety with mobility;Balance;Proper use of DME;Strengthening/ROM OT goals addressed during session: Strengthening/ROM;ADL's and self-care      AM-PAC OT "6 Clicks" Daily Activity     Outcome Measure   Help from another person eating meals?: None Help from another person taking care of personal grooming?: A Little Help from another person toileting, which includes using toliet, bedpan, or urinal?: A Lot Help from another person bathing (including washing, rinsing, drying)?: A  Lot Help from another person to put on and taking off regular upper body clothing?: A Little Help from another person to put on and taking off regular lower body clothing?: A Lot 6 Click Score: 16    End of Session Equipment Utilized During Treatment: Gait belt (crutches)  OT Visit Diagnosis: Unsteadiness on feet (R26.81);Pain Pain - Right/Left: Left Pain - part of body: Ankle and joints of foot (and left wrist)   Activity Tolerance Patient limited by pain (and dizziness)   Patient Left in bed;with call bell/phone within reach;with bed alarm set (encouarged to periodically sit on EOB to get LLE use to being in a dependent position)   Nurse Communication Patient requests pain meds        Time: 1610-9604 OT Time Calculation (min): 37 min  Charges: OT General Charges $OT Visit: 1 Visit OT Treatments $Self Care/Home Management : 8-22 mins  Lindon Romp OT Acute Rehabilitation Services Office [phone]    Evette Georges 11/09/2022, 4:55 PM

## 2022-11-09 NOTE — Progress Notes (Signed)
PROGRESS NOTE  Kenneth Bailey  ZOX:[id number] DOB: January 18, 1966 DOA: 10/30/2022 PCP: [name], MD   Brief Narrative: Patient is a 57 year old male with history of uncontrolled diabetes type 2, coronary artery disease status post CABG, chronic combined systolic/diastolic CHF, tobacco abuse who presented with complaint of persistent left foot pain, concern for osteomyelitis of left fifth toe as per recommendation of his podiatrist.  He was found to have a superficial ulceration at the lateral plantar aspect of the fifth metatarsal with underlying early osteomyelitis of the fifth metatarsal head as well as a phlegmon and early abscess fibrillation.  Seen by podiatry, status post left foot incision and drainage with resection of fifth metatarsal head on 6/11 with cultures showed MRSA.  ID, vascular surgery also consulted for concern for peripheral artery disease.  Hospital course was remarkable for nausea, vomiting, hematemesis, GI consulted, underwent EGD and found to have reflux esophagitis, nonbleeding gastric ulcer.  Hospital course remarkable for persistent left foot pain, podiatry repeated I&D of foot and washout on 6/19.  Leukocytosis worsened .  ID reconsulted  Assessment & Plan:  Principal Problem:   Osteomyelitis (HCC) Active Problems:   Abscess of left foot   Gastric ulcer   Duodenal ulcer   [place] grade D esophagitis  Left fifth toe/lateral foot cellulitis with osteomyelitis: Podiatry following.  Status post left foot I&D and resection of fifth metatarsal on 6/11.  Wound culture showed MRSA.  ID was also consulted, he got few days of vancomycin, cefepime.  Given oritavancin , plan for starting doxycycline 11/11/2022 for 14 days. Hospital course remarkable for persistent left foot pain.Hospital course remarkable for persistent left foot pain, podiatry repeated I&D of foot and washout on 6/19.  Found to have a lot of necrotic tissue.  Wound cultures repeated, pending.  ID  reconsulted because of worsening leukocytosis, concern for nonhealing wound Will also consult PT today  Peripheral artery disease: Vascular surgery was following.  Status post abdominal aortogram, left posterior tibial artery and tibioperoneal trunk angioplasty.  Currently on aspirin, Plavix.  GI bleed:Hospital course was remarkable for nausea, vomiting, hematemesis, headache stools, GI consulted, underwent EGD and found to have reflux esophagitis, nonbleeding gastric ulcer.  Currently on Protonix.  Currently hemoglobin stable in the range of 9  Chronic combined systolic/diastolic CHF: Currently euvolemic  Tobacco abuse: Continue nicotine patch  Hyperlipidemia: On gemfibrozil,ezitimib  Coronary artery disease status post CABG: No anginal symptoms.  Currently on aspirin, Plavix  Uncontrolled diabetes type 2: Recent A1c of 10.7.  Monitor blood sugars.  Continue current insulin regimen.  Diabetic coordinator following  Hyperkalemia/AKI/hyponatremia: Most likely from dehydration.  Started on IV fluids, given a dose of Lokelma today        DVT prophylaxis:SCDs Start: 10/30/22 2211     Code Status: Full Code  Family Communication: None at bedside  Patient status:Inpatient  Patient is from :Home  Anticipated discharge WJ:XBJY   Estimated DC date:after clearance from podiatry/ID   Consultants: podiatry,GI,ID ,vascular surgery  Procedures:I and D,EGD,angioplasty  Antimicrobials:  Anti-infectives (From admission, onward)    Start     Dose/Rate Route Frequency Ordered Stop   11/11/22 1000  doxycycline (VIBRA-TABS) tablet 100 mg        100 mg Oral Every 12 hours 11/03/22 1616 12/23/22 0959   11/11/22 0000  doxycycline (VIBRAMYCIN) 100 MG capsule        100 mg Oral 2 times daily 11/07/22 1624 11/25/22 2359   11/09/22 1045  amoxicillin-clavulanate (AUGMENTIN) 875-125 MG  per tablet 1 tablet        1 tablet Oral Every 12 hours 11/09/22 0957 12/21/22 0959   11/04/22 0800   Oritavancin Diphosphate (ORBACTIV) 1,200 mg in dextrose 5 % IVPB        1,200 mg 333.3 mL/hr over 180 Minutes Intravenous Once 11/03/22 1611 11/04/22 1158   11/03/22 0500  vancomycin (VANCOREADY) IVPB 1500 mg/300 mL  Status:  Discontinued        1,500 mg 150 mL/hr over 120 Minutes Intravenous Every 24 hours 11/02/22 1439 11/03/22 1611   11/02/22 1445  ceFEPIme (MAXIPIME) 2 g in sodium chloride 0.9 % 100 mL IVPB  Status:  Discontinued        2 g 200 mL/hr over 30 Minutes Intravenous Every 8 hours 11/02/22 1437 11/03/22 1430   11/02/22 0500  vancomycin (VANCOREADY) IVPB 1250 mg/250 mL  Status:  Discontinued        1,250 mg 166.7 mL/hr over 90 Minutes Intravenous Every 24 hours 11/01/22 0721 11/02/22 1439   11/01/22 0430  vancomycin (VANCOCIN) IVPB 1000 mg/200 mL premix        1,000 mg 200 mL/hr over 60 Minutes Intravenous  Once 11/01/22 0339 11/01/22 0620   11/01/22 0430  ceFEPIme (MAXIPIME) 2 g in sodium chloride 0.9 % 100 mL IVPB  Status:  Discontinued        2 g 200 mL/hr over 30 Minutes Intravenous Every 12 hours 11/01/22 0342 11/02/22 1437   11/01/22 0400  ceFEPIme (MAXIPIME) 2 g in sodium chloride 0.9 % 100 mL IVPB  Status:  Discontinued        2 g 200 mL/hr over 30 Minutes Intravenous Every 24 hours 10/31/22 0342 11/01/22 0341   10/31/22 0430  ceFEPIme (MAXIPIME) 2 g in sodium chloride 0.9 % 100 mL IVPB        2 g 200 mL/hr over 30 Minutes Intravenous  Once 10/31/22 0342 10/31/22 0434   10/30/22 2138  vancomycin variable dose per unstable renal function (pharmacist dosing)  Status:  Discontinued         Does not apply See admin instructions 10/30/22 2138 11/01/22 0721   10/30/22 1745  vancomycin (VANCOREADY) IVPB 1500 mg/300 mL        1,500 mg 150 mL/hr over 120 Minutes Intravenous  Once 10/30/22 1743 10/30/22 2058       Subjective: Patient seen and examined the today.  He feels better than yesterday.  Rates the pain as 6/10 on his left foot.Doesn't talk  much  Objective: Vitals:   11/08/22 2315 11/09/22 0346 11/09/22 0353 11/09/22 0821  BP: (!) 120/58 (!) 140/62  (!) 162/64  Pulse: 70 67  69  Resp:      Temp: 98.5 F (36.9 C) 98.5 F (36.9 C)  98.4 F (36.9 C)  TempSrc: Oral Oral  Oral  SpO2: 96% 97%  99%  Weight:   73 kg   Height:        Intake/Output Summary (Last 24 hours) at 11/09/2022 1117 Last data filed at 11/09/2022 0658 Gross per 24 hour  Intake 640 ml  Output 1850 ml  Net -1210 ml   Filed Weights   11/07/22 0547 11/08/22 0626 11/09/22 0353  Weight: 74.6 kg 72.5 kg 73 kg    Examination:  General exam: Overall comfortable, not in distress HEENT: PERRL Respiratory system:  no wheezes or crackles  Cardiovascular system: S1 & S2 heard, RRR.  Gastrointestinal system: Abdomen is nondistended, soft and nontender. Central nervous system: Alert  and oriented Extremities: No edema, no clubbing ,no cyanosis,left foot covered with dressing Skin: No rashes, no ulcers,no icterus     Data Reviewed: I have personally reviewed following labs and imaging studies  CBC: Recent Labs  Lab 11/04/22 1450 11/04/22 1943 11/05/22 0547 11/05/22 1420 11/06/22 0153 11/06/22 0834 11/06/22 2333 11/07/22 0136 11/07/22 0733 11/08/22 0109 11/09/22 0120  WBC 17.5*  --  13.0*  --  11.4*  --   --  13.2*  --  14.4* 19.7*  NEUTROABS 14.9*  --  9.7*  --  8.5*  --   --  9.2*  --  11.8*  --   HGB 7.0*   < > 9.0*   < > 7.1*   < > 8.6* 9.7* 9.9* 9.1* 9.6*  HCT 21.0*   < > 25.7*   < > 21.2*   < > 25.0* 28.2* 29.4* 26.9* 29.5*  MCV 84.7  --  82.4  --  85.1  --   --  84.4  --  83.3 88.1  PLT 374  --  248  --  225  --   --  272  --  298 336   < > = values in this interval not displayed.   Basic Metabolic Panel: Recent Labs  Lab 11/04/22 0123 11/04/22 1450 11/05/22 0547 11/06/22 0153 11/07/22 0136 11/08/22 0109 11/09/22 0120  NA 133*   < > 135 132* 132* 130* 129*  K 4.1   < > 4.1 4.2 4.4 4.4 5.8*  CL 97*   < > 98 99 95* 95* 93*   CO2 24   < > 21* 23 26 24 25   GLUCOSE 211*   < > 215* 294* 177* 288* 269*  BUN 39*   < > 58* 40* 27* 24* 32*  CREATININE 1.03   < > 1.08 1.29* 1.04 1.22 1.53*  CALCIUM 9.1   < > 8.5* 8.2* 9.0 9.1 9.3  MG 1.5*  --  1.7 1.7 1.9 1.7  --   PHOS 3.7  --  3.2 3.5 4.0 3.7  --    < > = values in this interval not displayed.     Recent Results (from the past 240 hour(s))  Blood culture (routine x 2)     Status: None   Collection Time: 10/30/22  4:58 PM   Specimen: BLOOD  Result Value Ref Range Status   Specimen Description BLOOD LEFT FOREARM  Final   Special Requests   Final    BOTTLES DRAWN AEROBIC AND ANAEROBIC Blood Culture adequate volume   Culture   Final    NO GROWTH 5 DAYS Performed at [facility], [address], [place], [place] [address]    Report Status 11/04/2022 FINAL  Final  Blood culture (routine x 2)     Status: None   Collection Time: 10/30/22  5:11 PM   Specimen: BLOOD  Result Value Ref Range Status   Specimen Description BLOOD RIGHT ANTECUBITAL  Final   Special Requests   Final    BOTTLES DRAWN AEROBIC AND ANAEROBIC Blood Culture results may not be optimal due to an excessive volume of blood received in culture bottles   Culture   Final    NO GROWTH 5 DAYS Performed at [facility], [address], [place], [place] [address]    Report Status 11/04/2022 FINAL  Final  MRSA Next Gen by PCR, Nasal     Status: None   Collection Time: 10/31/22  3:44 AM  Specimen: Nasal Mucosa; Nasal Swab  Result Value Ref Range Status   MRSA by PCR Next Gen NOT DETECTED NOT DETECTED Final    Comment: (NOTE) The GeneXpert MRSA Assay (FDA approved for NASAL specimens only), is one component of a comprehensive MRSA colonization surveillance program. It is not intended to diagnose MRSA infection nor to guide or monitor treatment for MRSA infections. Test performance is not FDA approved in patients less than 17 years old. Performed at [facility], [address] [place], [place] [address]   Aerobic/Anaerobic Culture w Gram Stain (surgical/deep wound)     Status: None   Collection Time: 10/31/22  1:24 PM   Specimen: Toe, Left; Amputation  Result Value Ref Range Status   Specimen Description WOUND LEFT TOE  Final   Special Requests 5TH METATARSAL HEAD PT ON VANC CEFEPIME  Final   Gram Stain   Final    NO WBC SEEN RARE GRAM POSITIVE COCCI IN PAIRS RARE GRAM NEGATIVE RODS    Culture   Final    FEW METHICILLIN RESISTANT STAPHYLOCOCCUS AUREUS NO ANAEROBES ISOLATED Performed at [facility], [address], [place], [place] [address]    Report Status 11/05/2022 FINAL  Final   Organism ID, Bacteria METHICILLIN RESISTANT STAPHYLOCOCCUS AUREUS  Final      Susceptibility   Methicillin resistant staphylococcus aureus - MIC*    CIPROFLOXACIN >=8 RESISTANT Resistant     ERYTHROMYCIN >=8 RESISTANT Resistant     GENTAMICIN <=0.5 SENSITIVE Sensitive     OXACILLIN >=4 RESISTANT Resistant     TETRACYCLINE <=1 SENSITIVE Sensitive     VANCOMYCIN <=0.5 SENSITIVE Sensitive     TRIMETH/SULFA 160 RESISTANT Resistant     CLINDAMYCIN >=8 RESISTANT Resistant     RIFAMPIN <=0.5 SENSITIVE Sensitive     Inducible Clindamycin NEGATIVE Sensitive     LINEZOLID 2 SENSITIVE Sensitive     * FEW METHICILLIN RESISTANT STAPHYLOCOCCUS AUREUS  Surgical pcr screen     Status: None   Collection Time: 11/03/22  7:07 AM   Specimen: Nasal Mucosa; Nasal Swab  Result Value Ref Range Status   MRSA, PCR NEGATIVE NEGATIVE Final   Staphylococcus aureus NEGATIVE NEGATIVE Final    Comment: (NOTE) The Xpert SA Assay (FDA approved for NASAL specimens in patients 78 years of age and older), is one component of a comprehensive surveillance program. It is not intended to diagnose infection nor to guide or monitor treatment. Performed at [facility], [address], [place], [place] [address]   Culture, blood (x 2)     Status: None   Collection Time: 11/04/22  2:50 PM    Specimen: BLOOD  Result Value Ref Range Status   Specimen Description BLOOD SITE NOT SPECIFIED  Final   Special Requests   Final    BOTTLES DRAWN AEROBIC AND ANAEROBIC Blood Culture results may not be optimal due to an inadequate volume of blood received in culture bottles   Culture   Final    NO GROWTH 5 DAYS Performed at [facility], [address], [place], [place] [address]    Report Status 11/09/2022 FINAL  Final  Culture, blood (x 2)     Status: None   Collection Time: 11/04/22  2:54 PM   Specimen: BLOOD  Result Value Ref Range Status   Specimen Description BLOOD BLOOD RIGHT HAND  Final   Special Requests   Final    BOTTLES DRAWN AEROBIC AND ANAEROBIC Blood Culture  results may not be optimal due to an inadequate volume of blood received in culture bottles   Culture   Final    NO GROWTH 5 DAYS Performed at [facility], [address], [place], [place] [address]    Report Status 11/09/2022 FINAL  Final  Aerobic/Anaerobic Culture w Gram Stain (surgical/deep wound)     Status: None (Preliminary result)   Collection Time: 11/08/22  6:22 PM   Specimen: Foot, Left; Tissue  Result Value Ref Range Status   Specimen Description ABSCESS LEFT FOOT  Final   Special Requests SWABS SAMPLE A  Final   Gram Stain   Final    ABUNDANT WBC PRESENT, PREDOMINANTLY PMN RARE GRAM POSITIVE COCCI    Culture   Final    TOO YOUNG TO READ Performed at [facility], [address], [place], [place] [address]    Report Status PENDING  Incomplete  Aerobic/Anaerobic Culture w Gram Stain (surgical/deep wound)     Status: None (Preliminary result)   Collection Time: 11/08/22  6:23 PM   Specimen: Foot, Left; Tissue  Result Value Ref Range Status   Specimen Description BONE  Final   Special Requests 5TH  METATARSAL  Final   Gram Stain NO WBC SEEN NO ORGANISMS SEEN   Final   Culture   Final    NO GROWTH < 12 HOURS Performed at [facility], [address] [place],  [place] [address]    Report Status PENDING  Incomplete     Radiology Studies: DG Foot 2 Views Left  Result Date: 11/08/2022 CLINICAL DATA:  Recent I and D, initial encounter EXAM: LEFT FOOT - 2 VIEW COMPARISON:  11/07/2022 FINDINGS: Interval surgical excision of the fifth metatarsal is seen. No erosive changes to suggest osteomyelitis are noted. The previously seen soft tissue wound has been excised. Degenerative changes of the tarsal bones are noted. Calcaneal spurring is seen. IMPRESSION: Postsurgical changes without acute bony abnormality. Electronically Signed   By: Alcide Clever M.D.   On: 11/08/2022 20:01   DG Foot Complete Left  Result Date: 11/07/2022 CLINICAL DATA:  Osteomyelitis of foot EXAM: LEFT FOOT - COMPLETE 3+ VIEW COMPARISON:  10/31/2022, 10/30/2022 FINDINGS: Resection of distal fifth metatarsal. Stable alignment fifth digit. Cut margin remains smooth. No osseous destructive change. Increased air within the soft tissues at the surgical site. IMPRESSION: Status post resection of distal fifth metatarsal. Increased air collection within the soft tissues at the surgical site. Electronically Signed   By: Jasmine Pang M.D.   On: 11/07/2022 21:07    Scheduled Meds:  amoxicillin-clavulanate  1 tablet Oral Q12H   aspirin EC  81 mg Oral Daily   clopidogrel  75 mg Oral Daily   [START ON 11/11/2022] doxycycline  100 mg Oral Q12H   ezetimibe  10 mg Oral Daily   gemfibrozil  600 mg Oral BID AC   insulin aspart  0-5 Units Subcutaneous QHS   insulin aspart  0-9 Units Subcutaneous TID WC   [START ON 11/10/2022] insulin glargine-yfgn  15 Units Subcutaneous Daily   insulin glargine-yfgn  5 Units Subcutaneous Once   melatonin  10 mg Oral QHS   nicotine  14 mg Transdermal Daily   pantoprazole  40 mg Oral BID   sodium chloride flush  3 mL Intravenous Q12H   Continuous Infusions:  sodium chloride     sodium chloride 100 mL/hr at 11/09/22 0926     LOS: 10 days   Raigan Baria  Renford Dills, MD [facility] P6/20/2024, 11:17 AM

## 2022-11-10 DIAGNOSIS — M86172 Other acute osteomyelitis, left ankle and foot: Secondary | ICD-10-CM | POA: Diagnosis not present

## 2022-11-10 LAB — BASIC METABOLIC PANEL
Anion gap: 13 (ref 5–15)
BUN: 33 mg/dL — ABNORMAL HIGH (ref 6–20)
CO2: 23 mmol/L (ref 22–32)
Calcium: 8.8 mg/dL — ABNORMAL LOW (ref 8.9–10.3)
Chloride: 95 mmol/L — ABNORMAL LOW (ref 98–111)
Creatinine, Ser: 1.23 mg/dL (ref 0.61–1.24)
GFR, Estimated: >60 mL/min (ref >60)
Glucose, Bld: 218 mg/dL — ABNORMAL HIGH (ref 70–99)
Potassium: 4.5 mmol/L (ref 3.5–5.1)
Sodium: 131 mmol/L — ABNORMAL LOW (ref 135–145)

## 2022-11-10 LAB — GLUCOSE, CAPILLARY
Glucose-Capillary: 160 mg/dL — ABNORMAL HIGH (ref 70–99)
Glucose-Capillary: 106 mg/dL — ABNORMAL HIGH (ref 70–99)
Glucose-Capillary: 256 mg/dL — ABNORMAL HIGH (ref 70–99)
Glucose-Capillary: 169 mg/dL — ABNORMAL HIGH (ref 70–99)
Glucose-Capillary: 224 mg/dL — ABNORMAL HIGH (ref 70–99)

## 2022-11-10 LAB — CBC
HCT: 22.8 % — ABNORMAL LOW (ref 39.0–52.0)
Hemoglobin: 7.5 g/dL — ABNORMAL LOW (ref 13.0–17.0)
MCH: 28.1 pg (ref 26.0–34.0)
MCHC: 32.9 g/dL (ref 30.0–36.0)
MCV: 85.4 fL (ref 80.0–100.0)
Platelets: 298 10*3/uL (ref 150–400)
RBC: 2.67 MIL/uL — ABNORMAL LOW (ref 4.22–5.81)
RDW: 14.3 % (ref 11.5–15.5)
WBC: 14.6 10*3/uL — ABNORMAL HIGH (ref 4.0–10.5)
nRBC: 0 % (ref 0.0–0.2)

## 2022-11-10 LAB — AEROBIC/ANAEROBIC CULTURE W GRAM STAIN (SURGICAL/DEEP WOUND)

## 2022-11-10 MED ORDER — DOXYCYCLINE HYCLATE 100 MG PO TABS
100.0000 mg | ORAL_TABLET | Freq: Two times a day (BID) | ORAL | Status: DC
  Administered 2022-11-10 – 2022-11-17 (×15): 100 mg via ORAL
  Filled 2022-11-10 (×15): qty 1

## 2022-11-10 NOTE — Inpatient Diabetes Management (Addendum)
Inpatient Diabetes Program Recommendations  AACE/ADA: New Consensus Statement on Inpatient Glycemic Control (2015)  Target Ranges:  Prepandial:   less than 140 mg/dL      Peak postprandial:   less than 180 mg/dL (1-2 hours)      Critically ill patients:  140 - 180 mg/dL   Lab Results  Component Value Date   GLUCAP 256 (H) 11/10/2022   HGBA1C 10.7 (H) 10/11/2022    Review of Glycemic Control  Latest Reference Range & Units 11/10/22 08:29 11/10/22 11:54  Glucose-Capillary 70 - 99 mg/dL 308 (H) 657 (H)  (H): Data is abnormally high  Diabetes history: DM2 Outpatient Diabetes medications: Semglee 12-50 units BID, s/s, metformin 1000 mg BID, Farxiga 10 QD Current orders for Inpatient glycemic control: Semglee 15 every day, Novolog 0-9 units TID with meals  Inpatient Diabetes Program Recommendations:    Novolog 2 units TID with meals if he consumes at least 50%  Will continue to follow while inpatient.  Thank you, Dulce Sellar, MSN, CDCES Diabetes Coordinator Inpatient Diabetes Program [phone] (team pager from 8a-5p)

## 2022-11-10 NOTE — Progress Notes (Addendum)
PROGRESS NOTE  Kenneth Bailey  BTD:[id number] DOB: Feb 12, 1966 DOA: 10/30/2022 PCP: [name], MD   Brief Narrative: Patient is a 57 year old male with history of uncontrolled diabetes type 2, coronary artery disease status post CABG, chronic combined systolic/diastolic CHF, tobacco abuse who presented with complaint of persistent left foot pain, concern for osteomyelitis of left fifth toe as per recommendation of his podiatrist.  He was found to have a superficial ulceration at the lateral plantar aspect of the fifth metatarsal with underlying early osteomyelitis of the fifth metatarsal head as well as a phlegmon and early abscess fibrillation.  Seen by podiatry, status post left foot incision and drainage with resection of fifth metatarsal head on 6/11 with cultures showed MRSA.  ID, vascular surgery also consulted for concern for peripheral artery disease.  Hospital course was remarkable for nausea, vomiting, hematemesis, GI consulted, underwent EGD and found to have reflux esophagitis, nonbleeding gastric ulcer.  Hospital course remarkable for persistent left foot pain, podiatry repeated I&D of foot and washout on 6/19.  PT/OT recommending SNF on discharge, can be discharged with possible  Assessment & Plan:  Principal Problem:   Osteomyelitis (HCC) Active Problems:   Abscess of left foot   Gastric ulcer   Duodenal ulcer   [place] grade D esophagitis  Left fifth toe/lateral foot cellulitis with osteomyelitis: Podiatry following.  Status post left foot I&D and resection of fifth metatarsal on 6/11.  Wound culture showed MRSA.  ID was also consulted, he got few days of vancomycin, cefepime.  Given oritavancin , plan was for starting doxycycline 11/11/2022 for 14 days. Hospital course remarkable for persistent left foot pain.Hospital course remarkable for persistent left foot pain, podiatry repeated I&D of foot and washout on 6/19.  Found to have a lot of necrotic tissue.  Wound  cultures repeated,prelim is showing staph.  ID reconsulted because of worsening leukocytosis, concern for nonhealing wound.  Case discussed with Dr. Danelle Earthly, she recommended 6 weeks of Augmentin and doxycycline .  Peripheral artery disease: Vascular surgery was following.  Status post abdominal aortogram, left posterior tibial artery and tibioperoneal trunk angioplasty.  Currently on aspirin, Plavix.  GI bleed:Hospital course was remarkable for nausea, vomiting, hematemesis, black stools, GI consulted, underwent EGD and found to have reflux esophagitis, nonbleeding gastric ulcer.  Currently on Protonix.  Hemoglobin dropped to the range of 7.5 today but no new episode of hematochezia or melena.  Continue to monitor  Chronic combined systolic/diastolic CHF: Currently euvolemic  Tobacco abuse: Continue nicotine patch  Hyperlipidemia: On gemfibrozil,ezitimib  Coronary artery disease status post CABG: No anginal symptoms.  Currently on aspirin, Plavix  Uncontrolled diabetes type 2: Recent A1c of 10.7.  Monitor blood sugars.  Continue current insulin regimen.  Diabetic coordinator following  Hyperkalemia/AKI/hyponatremia: Stable now after iv fluids were given  Debility/deconditioning: Patient lives with his mother.  PT is recommending SNF on discharge.  TOC following        DVT prophylaxis:SCDs Start: 10/30/22 2211     Code Status: Full Code  Family Communication: None at bedside  Patient status:Inpatient  Patient is from :Home  Anticipated discharge to:SNF   Estimated DC date:2-3 days   Consultants: podiatry,GI,ID ,vascular surgery  Procedures:I and D,EGD,angioplasty  Antimicrobials:  Anti-infectives (From admission, onward)    Start     Dose/Rate Route Frequency Ordered Stop   11/11/22 1000  doxycycline (VIBRA-TABS) tablet 100 mg  Status:  Discontinued        100 mg Oral Every 12  hours 11/03/22 1616 11/10/22 0800   11/11/22 0000  doxycycline (VIBRAMYCIN) 100 MG  capsule  Status:  Discontinued        100 mg Oral 2 times daily 11/07/22 1624 11/09/22    11/10/22 0800  doxycycline (VIBRA-TABS) tablet 100 mg        100 mg Oral Every 12 hours 11/10/22 0800 12/22/22 0959   11/09/22 1045  amoxicillin-clavulanate (AUGMENTIN) 875-125 MG per tablet 1 tablet        1 tablet Oral Every 12 hours 11/09/22 0957 12/21/22 0959   11/09/22 0000  doxycycline (VIBRAMYCIN) 100 MG capsule        100 mg Oral 2 times daily 11/09/22 1424 12/21/22 2359   11/09/22 0000  amoxicillin-clavulanate (AUGMENTIN) 875-125 MG tablet        1 tablet Oral 2 times daily 11/09/22 1424 12/21/22 2359   11/04/22 0800  Oritavancin Diphosphate (ORBACTIV) 1,200 mg in dextrose 5 % IVPB        1,200 mg 333.3 mL/hr over 180 Minutes Intravenous Once 11/03/22 1611 11/04/22 1158   11/03/22 0500  vancomycin (VANCOREADY) IVPB 1500 mg/300 mL  Status:  Discontinued        1,500 mg 150 mL/hr over 120 Minutes Intravenous Every 24 hours 11/02/22 1439 11/03/22 1611   11/02/22 1445  ceFEPIme (MAXIPIME) 2 g in sodium chloride 0.9 % 100 mL IVPB  Status:  Discontinued        2 g 200 mL/hr over 30 Minutes Intravenous Every 8 hours 11/02/22 1437 11/03/22 1430   11/02/22 0500  vancomycin (VANCOREADY) IVPB 1250 mg/250 mL  Status:  Discontinued        1,250 mg 166.7 mL/hr over 90 Minutes Intravenous Every 24 hours 11/01/22 0721 11/02/22 1439   11/01/22 0430  vancomycin (VANCOCIN) IVPB 1000 mg/200 mL premix        1,000 mg 200 mL/hr over 60 Minutes Intravenous  Once 11/01/22 0339 11/01/22 0620   11/01/22 0430  ceFEPIme (MAXIPIME) 2 g in sodium chloride 0.9 % 100 mL IVPB  Status:  Discontinued        2 g 200 mL/hr over 30 Minutes Intravenous Every 12 hours 11/01/22 0342 11/02/22 1437   11/01/22 0400  ceFEPIme (MAXIPIME) 2 g in sodium chloride 0.9 % 100 mL IVPB  Status:  Discontinued        2 g 200 mL/hr over 30 Minutes Intravenous Every 24 hours 10/31/22 0342 11/01/22 0341   10/31/22 0430  ceFEPIme (MAXIPIME) 2 g in  sodium chloride 0.9 % 100 mL IVPB        2 g 200 mL/hr over 30 Minutes Intravenous  Once 10/31/22 0342 10/31/22 0434   10/30/22 2138  vancomycin variable dose per unstable renal function (pharmacist dosing)  Status:  Discontinued         Does not apply See admin instructions 10/30/22 2138 11/01/22 0721   10/30/22 1745  vancomycin (VANCOREADY) IVPB 1500 mg/300 mL        1,500 mg 150 mL/hr over 120 Minutes Intravenous  Once 10/30/22 1743 10/30/22 2058       Subjective: Patient seen and examined at bedside today.  Denies any complaints today.  Pain on the left foot is better than yesterday.  He is agreeable for a skilled nursing facility after discussion at bedside.  Objective: Vitals:   11/09/22 1255 11/09/22 1611 11/09/22 2015 11/10/22 0830  BP: (!) 143/60 (!) 145/47  136/64  Pulse:  78  70  Resp: 19  18  Temp: 98.3 F (36.8 C) 98.5 F (36.9 C) 98.7 F (37.1 C) 97.6 F (36.4 C)  TempSrc: Oral Oral Oral Oral  SpO2:  98%  100%  Weight:      Height:        Intake/Output Summary (Last 24 hours) at 11/10/2022 1148 Last data filed at 11/10/2022 1478 Gross per 24 hour  Intake 1674.27 ml  Output 2800 ml  Net -1125.73 ml   Filed Weights   11/07/22 0547 11/08/22 0626 11/09/22 0353  Weight: 74.6 kg 72.5 kg 73 kg    Examination:   General exam: Overall comfortable, not in distress HEENT: PERRL Respiratory system:  no wheezes or crackles  Cardiovascular system: S1 & S2 heard, RRR.  Gastrointestinal system: Abdomen is nondistended, soft and nontender. Central nervous system: Alert and oriented Extremities: No edema, no clubbing ,no cyanosis, left foot covered with dressing Skin: No rashes, no ulcers,no icterus     Data Reviewed: I have personally reviewed following labs and imaging studies  CBC: Recent Labs  Lab 11/04/22 1450 11/04/22 1943 11/05/22 0547 11/05/22 1420 11/06/22 0153 11/06/22 0834 11/07/22 0136 11/07/22 0733 11/08/22 0109 11/09/22 0120  11/10/22 0138  WBC 17.5*  --  13.0*  --  11.4*  --  13.2*  --  14.4* 19.7* 14.6*  NEUTROABS 14.9*  --  9.7*  --  8.5*  --  9.2*  --  11.8*  --   --   HGB 7.0*   < > 9.0*   < > 7.1*   < > 9.7* 9.9* 9.1* 9.6* 7.5*  HCT 21.0*   < > 25.7*   < > 21.2*   < > 28.2* 29.4* 26.9* 29.5* 22.8*  MCV 84.7  --  82.4  --  85.1  --  84.4  --  83.3 88.1 85.4  PLT 374  --  248  --  225  --  272  --  298 336 298   < > = values in this interval not displayed.   Basic Metabolic Panel: Recent Labs  Lab 11/04/22 0123 11/04/22 1450 11/05/22 0547 11/06/22 0153 11/07/22 0136 11/08/22 0109 11/09/22 0120 11/10/22 0138  NA 133*   < > 135 132* 132* 130* 129* 131*  K 4.1   < > 4.1 4.2 4.4 4.4 5.8* 4.5  CL 97*   < > 98 99 95* 95* 93* 95*  CO2 24   < > 21* 23 26 24 25 23   GLUCOSE 211*   < > 215* 294* 177* 288* 269* 218*  BUN 39*   < > 58* 40* 27* 24* 32* 33*  CREATININE 1.03   < > 1.08 1.29* 1.04 1.22 1.53* 1.23  CALCIUM 9.1   < > 8.5* 8.2* 9.0 9.1 9.3 8.8*  MG 1.5*  --  1.7 1.7 1.9 1.7  --   --   PHOS 3.7  --  3.2 3.5 4.0 3.7  --   --    < > = values in this interval not displayed.     Recent Results (from the past 240 hour(s))  Aerobic/Anaerobic Culture w Gram Stain (surgical/deep wound)     Status: None   Collection Time: 10/31/22  1:24 PM   Specimen: Toe, Left; Amputation  Result Value Ref Range Status   Specimen Description WOUND LEFT TOE  Final   Special Requests 5TH METATARSAL HEAD PT ON VANC CEFEPIME  Final   Gram Stain   Final    NO WBC SEEN RARE GRAM POSITIVE COCCI  IN PAIRS RARE GRAM NEGATIVE RODS    Culture   Final    FEW METHICILLIN RESISTANT STAPHYLOCOCCUS AUREUS NO ANAEROBES ISOLATED Performed at [facility], [address] [place], [place] [address]    Report Status 11/05/2022 FINAL  Final   Organism ID, Bacteria METHICILLIN RESISTANT STAPHYLOCOCCUS AUREUS  Final      Susceptibility   Methicillin resistant staphylococcus aureus - MIC*    CIPROFLOXACIN >=8 RESISTANT  Resistant     ERYTHROMYCIN >=8 RESISTANT Resistant     GENTAMICIN <=0.5 SENSITIVE Sensitive     OXACILLIN >=4 RESISTANT Resistant     TETRACYCLINE <=1 SENSITIVE Sensitive     VANCOMYCIN <=0.5 SENSITIVE Sensitive     TRIMETH/SULFA 160 RESISTANT Resistant     CLINDAMYCIN >=8 RESISTANT Resistant     RIFAMPIN <=0.5 SENSITIVE Sensitive     Inducible Clindamycin NEGATIVE Sensitive     LINEZOLID 2 SENSITIVE Sensitive     * FEW METHICILLIN RESISTANT STAPHYLOCOCCUS AUREUS  Surgical pcr screen     Status: None   Collection Time: 11/03/22  7:07 AM   Specimen: Nasal Mucosa; Nasal Swab  Result Value Ref Range Status   MRSA, PCR NEGATIVE NEGATIVE Final   Staphylococcus aureus NEGATIVE NEGATIVE Final    Comment: (NOTE) The Xpert SA Assay (FDA approved for NASAL specimens in patients 70 years of age and older), is one component of a comprehensive surveillance program. It is not intended to diagnose infection nor to guide or monitor treatment. Performed at [facility], [address] [place], [place] [address]   Culture, blood (x 2)     Status: None   Collection Time: 11/04/22  2:50 PM   Specimen: BLOOD  Result Value Ref Range Status   Specimen Description BLOOD SITE NOT SPECIFIED  Final   Special Requests   Final    BOTTLES DRAWN AEROBIC AND ANAEROBIC Blood Culture results may not be optimal due to an inadequate volume of blood received in culture bottles   Culture   Final    NO GROWTH 5 DAYS Performed at [facility], [address], [place], [place] [address]    Report Status 11/09/2022 FINAL  Final  Culture, blood (x 2)     Status: None   Collection Time: 11/04/22  2:54 PM   Specimen: BLOOD  Result Value Ref Range Status   Specimen Description BLOOD BLOOD RIGHT HAND  Final   Special Requests   Final    BOTTLES DRAWN AEROBIC AND ANAEROBIC Blood Culture results may not be optimal due to an inadequate volume of blood received in culture bottles   Culture   Final    NO  GROWTH 5 DAYS Performed at [facility], [address], [place], [place] [address]    Report Status 11/09/2022 FINAL  Final  Aerobic/Anaerobic Culture w Gram Stain (surgical/deep wound)     Status: None (Preliminary result)   Collection Time: 11/08/22  6:22 PM   Specimen: Foot, Left; Tissue  Result Value Ref Range Status   Specimen Description ABSCESS LEFT FOOT  Final   Special Requests SWABS SAMPLE A  Final   Gram Stain   Final    ABUNDANT WBC PRESENT, PREDOMINANTLY PMN RARE GRAM POSITIVE COCCI    Culture   Final    MODERATE STAPHYLOCOCCUS AUREUS SUSCEPTIBILITIES TO FOLLOW Performed at [facility], [address], [place], [place] [address]    Report Status PENDING  Incomplete  Aerobic/Anaerobic Culture w  Gram Stain (surgical/deep wound)     Status: None (Preliminary result)   Collection Time: 11/08/22  6:23 PM   Specimen: Foot, Left; Tissue  Result Value Ref Range Status   Specimen Description BONE  Final   Special Requests 5TH  METATARSAL  Final   Gram Stain NO WBC SEEN NO ORGANISMS SEEN   Final   Culture   Final    CULTURE REINCUBATED FOR BETTER GROWTH Performed at [facility], [address], [place], [place] [address]    Report Status PENDING  Incomplete     Radiology Studies: DG Foot 2 Views Left  Result Date: 11/08/2022 CLINICAL DATA:  Recent I and D, initial encounter EXAM: LEFT FOOT - 2 VIEW COMPARISON:  11/07/2022 FINDINGS: Interval surgical excision of the fifth metatarsal is seen. No erosive changes to suggest osteomyelitis are noted. The previously seen soft tissue wound has been excised. Degenerative changes of the tarsal bones are noted. Calcaneal spurring is seen. IMPRESSION: Postsurgical changes without acute bony abnormality. Electronically Signed   By: Alcide Clever M.D.   On: 11/08/2022 20:01    Scheduled Meds:  amoxicillin-clavulanate  1 tablet Oral Q12H   aspirin EC  81 mg Oral Daily   clopidogrel  75 mg Oral Daily   doxycycline   100 mg Oral Q12H   ezetimibe  10 mg Oral Daily   gemfibrozil  600 mg Oral BID AC   insulin aspart  0-5 Units Subcutaneous QHS   insulin aspart  0-9 Units Subcutaneous TID WC   insulin glargine-yfgn  15 Units Subcutaneous Daily   melatonin  10 mg Oral QHS   nicotine  14 mg Transdermal Daily   pantoprazole  40 mg Oral BID   sodium chloride flush  3 mL Intravenous Q12H   Continuous Infusions:  sodium chloride       LOS: 11 days   Burnadette Pop, MD [facility] P6/21/2024, 11:48 AM

## 2022-11-10 NOTE — Progress Notes (Signed)
Mobility Specialist Progress Note:    11/10/22 1544  Mobility  Activity Stood at bedside;Transferred from bed to chair  Level of Assistance Contact guard assist, steadying assist  Assistive Device Front wheel walker  LLE Weight Bearing NWB  Activity Response Tolerated well  Mobility Referral Yes  $Mobility charge 1 Mobility  Mobility Specialist Start Time (ACUTE ONLY) 1523  Mobility Specialist Stop Time (ACUTE ONLY) 1540  Mobility Specialist Time Calculation (min) (ACUTE ONLY) 17 min   Pt received in bed, hesitant but agreeable to mobilize. Pt perform x3 STS exercises. No physical assistance needed only contact guard d/t safety. Pt assisted to chair secondary to request of bed change and bath. Assisted in bed change. RN notified. All needs met.    Thompson Grayer Mobility Specialist  Please contact vis Secure Chat or  Rehab Office [phone]

## 2022-11-10 NOTE — Progress Notes (Signed)
PODIATRY PROGRESS NOTE  NAME Kenneth Bailey MRN [id number] DOB 11-16-1965 DOA 10/30/2022   Reason for consult:  Chief Complaint  Patient presents with   Wound Infection     History of present illness: 57 y.o. male POD #2 s/p return to the OR for I&D, wound debridement.  States he been doing well.  Just worked with physical therapy had pain medication.  No fevers or chills.  Vitals:   11/10/22 1946 11/10/22 1949  BP:  119/66  Pulse:  74  Resp: 18 18  Temp:  99.1 F (37.3 C)  SpO2:  100%     CBC    Component Value Date/Time   WBC 14.6 (H) 11/10/2022 0138   RBC 2.67 (L) 11/10/2022 0138   HGB 7.5 (L) 11/10/2022 0138   HCT 22.8 (L) 11/10/2022 0138   PLT 298 11/10/2022 0138   MCV 85.4 11/10/2022 0138   MCH 28.1 11/10/2022 0138   MCHC 32.9 11/10/2022 0138   RDW 14.3 11/10/2022 0138   LYMPHSABS 1.7 11/08/2022 0109   MONOABS 0.6 11/08/2022 0109   EOSABS 0.0 11/08/2022 0109   BASOSABS 0.3 (H) 11/08/2022 0109       Latest Ref Rng & Units 11/10/2022    1:38 AM 11/09/2022    1:20 AM 11/08/2022    1:09 AM  BMP  Glucose 70 - 99 mg/dL 782  956  213   BUN 6 - 20 mg/dL 33  32  24   Creatinine 0.61 - 1.24 mg/dL 0.86  5.78  4.69   Sodium 135 - 145 mmol/L 131  129  130   Potassium 3.5 - 5.1 mmol/L 4.5  5.8  4.4   Chloride 98 - 111 mmol/L 95  93  95   CO2 22 - 32 mmol/L 23  25  24    Calcium 8.9 - 10.3 mg/dL 8.8  9.3  9.1     Physical Exam: General: AAOx3, NAD  Dermatology: Status post left fifth metatarsal head excision with sutures intact and central area packed open.  Left cellulitis much improved.  There is not significant necrotic tissue today.  There are some bloody drainage present but there is no purulence.  Incision coapted with sutures intact.  No fluctuation, crepitation, malodor.  Vascular: Unable to palpate pulse on left  Neurological: Sensation decreased  Musculoskeletal Exam: Mild pain on exam    ASSESSMENT/PLAN OF CARE  POD #2 s/p left 1  debridement, I&D  -White blood cell count is trending down.  -Dressing changed today.  There is still some bloody drainage on the bandage but decreased.  I irrigated the wound with saline.  I packed the corner of 4 x 4 moistened gauze into the wound followed by dry sterile dressing. -Culture 5th metatarsal 6/19-Staph aureus, Enterococcus faecalis -Culture abscess left foot 6/19-Staph aureus -Recommend evaluation by infectious disease for concern for residual osteomyelitis at this time. -Nonweightbearing except for heel for transfer. -Elevation -Podiatry will continue to follow   Please contact me directly with any questions or concerns.     Kenneth Bailey, DPM [facility]  Dr. Lesia Sago. Terin Bailey, DPM    [address], [place] [address]  Office [phone]  Fax [phone]

## 2022-11-10 NOTE — Progress Notes (Signed)
[facility]  Date of Admission:  10/30/2022     Principal Problem:   Osteomyelitis [facility]) Active Problems:   Abscess of left foot   Gastric ulcer   Duodenal ulcer   [place] grade D esophagitis          Assessment: 57 year old male admitted with fifth MTH osteomyelitis . #Fifth metatarsal osteomyelitis - Status post I&D with resection of fifth metatarsal head on 6/11.  Cultures growing MRSA. - Received oritavancin on 6/15 and then plan was to transition to doxycycline for 2 weeks to complete about 3 weeks of antibiotics from the OR.Marland Kitchen  He had undergone aortogram and tibioperoneal angioplasty due to critical limb ischemia with vascular surgery on 6/14. - Taken back to the OR on 6/19 as there was necrosis laterally to the foot.  He underwent I&D and washout of foot.  Cultures growing Staph aureus and E faecalis.  Recommendations: -Doxycycline and Augmentin x 6 weeks from the OR EOT 7/30.  If patient cannot tolerate p.o. antibiotics please engage infectious disease.  Follow sensitivities, or cultures on 6/19.  I opted for a long course of antibiotics as there may be a vascular component that is hindering wound healing in his case. -Follow-up infectious disease myself on 7/23 - ID will sign off   Microbiology:     Cultures: OR cultures 6/19 1/2 Staph aureus 1/2 Staph aureus and E faecalis 6/11 MRSA  SUBJECTIVE: Resting in bed no new complaints Interval: Afebrile overnight  Review of Systems: Review of Systems  All other systems reviewed and are negative.    Scheduled Meds:  amoxicillin-clavulanate  1 tablet Oral Q12H   aspirin EC  81 mg Oral Daily   clopidogrel  75 mg Oral Daily   doxycycline  100 mg Oral Q12H   ezetimibe  10 mg Oral Daily   gemfibrozil  600 mg Oral BID AC   insulin aspart  0-5 Units Subcutaneous QHS   insulin aspart  0-9 Units Subcutaneous TID WC   insulin glargine-yfgn  15 Units Subcutaneous Daily    melatonin  10 mg Oral QHS   nicotine  14 mg Transdermal Daily   pantoprazole  40 mg Oral BID   sodium chloride flush  3 mL Intravenous Q12H   Continuous Infusions:  sodium chloride     PRN Meds:.sodium chloride, acetaminophen, alum & mag hydroxide-simeth, calcium carbonate, hydrALAZINE, HYDROmorphone (DILAUDID) injection, labetalol, ondansetron (ZOFRAN) IV, oxyCODONE, polyethylene glycol, prochlorperazine, sodium chloride flush Allergies  Allergen Reactions   Lisinopril     Leg swelling   Statins     Leg swelling    OBJECTIVE: Vitals:   11/10/22 1732 11/10/22 1845 11/10/22 1946 11/10/22 1949  BP:    119/66  Pulse:    74  Resp: 18 18 18 18   Temp:    99.1 F (37.3 C)  TempSrc:   Oral Oral  SpO2:    100%  Weight:      Height:       Body mass index is 24.47 kg/m.  Physical Exam Constitutional:      General: He is not in acute distress.    Appearance: He is normal weight. He is not toxic-appearing.  HENT:     Head: Normocephalic and atraumatic.     Right Ear: External ear normal.     Left Ear: External ear normal.     Nose: No congestion or rhinorrhea.     Mouth/Throat:  Mouth: Mucous membranes are moist.     Pharynx: Oropharynx is clear.  Eyes:     Extraocular Movements: Extraocular movements intact.     Conjunctiva/sclera: Conjunctivae normal.     Pupils: Pupils are equal, round, and reactive to light.  Cardiovascular:     Rate and Rhythm: Normal rate and regular rhythm.     Heart sounds: No murmur heard.    No friction rub. No gallop.  Pulmonary:     Effort: Pulmonary effort is normal.     Breath sounds: Normal breath sounds.  Abdominal:     General: Abdomen is flat. Bowel sounds are normal.     Palpations: Abdomen is soft.  Musculoskeletal:        General: No swelling.     Cervical back: Normal range of motion and neck supple.     Comments: Foot wound bandaged  Skin:    General: Skin is warm and dry.  Neurological:     General: No focal deficit  present.     Mental Status: He is oriented to person, place, and time.  Psychiatric:        Mood and Affect: Mood normal.       Lab Results Lab Results  Component Value Date   WBC 14.6 (H) 11/10/2022   HGB 7.5 (L) 11/10/2022   HCT 22.8 (L) 11/10/2022   MCV 85.4 11/10/2022   PLT 298 11/10/2022    Lab Results  Component Value Date   CREATININE 1.23 11/10/2022   BUN 33 (H) 11/10/2022   NA 131 (L) 11/10/2022   K 4.5 11/10/2022   CL 95 (L) 11/10/2022   CO2 23 11/10/2022    Lab Results  Component Value Date   ALT 30 11/08/2022   AST 19 11/08/2022   ALKPHOS 78 11/08/2022   BILITOT 0.5 11/08/2022        Danelle Earthly, MD [facility] 11/10/2022, 11:23 PM  I have personally spent 55 minutes involved in face-to-face and non-face-to-face activities for this patient on the day of the visit. Professional time spent includes the following activities: Preparing to see the patient (review of tests), Obtaining and/or reviewing separately obtained history (admission/discharge record), Performing a medically appropriate examination and/or evaluation , Ordering medications/tests/procedures, referring and communicating with other health care professionals, Documenting clinical information in the EMR, Independently interpreting results (not separately reported), Communicating results to the patient/family/caregiver, Counseling and educating the patient/family/caregiver and Care coordination (not separately reported).

## 2022-11-10 NOTE — TOC Progression Note (Signed)
Transition of Care [facility]) - Progression Note    Patient Details  Name: Kenneth Bailey MRN: [id number] Date of Birth: [birth year]  Transition of Care [facility]) CM/SW Contact  Eduard Roux, [place] Phone Number: 11/10/2022, 11:59 AM  Clinical Narrative:     CSW met with patient at bedside. CSW introduced self and explained role. CSW discussed current recommendations for short term rehab at [facility]. Patient declined SNF. He states his car is here " I am not leaving my car" and " I don't let nobody drive my car". CSW inquired if he was agreeable to leave his car at the hospital until he completes rehab, he states "no". He states he is agreeable to [facility] at his mother home [address] [place], [place]. No preferred [facility] agency. He states " I am not leaving until I am able to drive my car, this is why I am not taking any pain medication".   TOC will continue to follow and assist with discharge planning.  Antony Blackbird, MSW, LCSW Clinical Social Worker    Expected Discharge Plan: Home w Home Health Services Barriers to Discharge: Continued Medical Work up  Expected Discharge Plan and Services   Discharge Planning Services: CM Consult Post Acute Care Choice: Durable Medical Equipment, Home Health Living arrangements for the past 2 months: Mobile Home Expected Discharge Date: 11/07/22               DME Arranged: Dan Humphreys rolling         HH Arranged: RN, PT, OT           Social Determinants of Health (SDOH) Interventions SDOH Screenings   Food Insecurity: No Food Insecurity (10/30/2022)  Housing: Low Risk  (10/30/2022)  Transportation Needs: No Transportation Needs (10/30/2022)  Utilities: Not At Risk (10/30/2022)  Financial Resource Strain: High Risk (01/09/2022)  Tobacco Use: High Risk (11/09/2022)    Readmission Risk Interventions     No data to display

## 2022-11-10 NOTE — Progress Notes (Signed)
Physical Therapy Treatment Patient Details Name: Kenneth Bailey MRN: [id number] DOB: 1965/09/14 Today's Date: 11/10/2022   History of Present Illness Pt is a 57 y.o. male admitted on 10/30/2022 with osteomyelitis. He is now s/p Left foot irrigation and debridement with resection of fifth metatarsal head 11/01/22. Pt underwent repeat I&D on 11/08/22. Past medical history significant for uncontrolled type 2 diabetes, diabetic polyneuropathy, coronary artery disease status post CABG, chronic combined diastolic and systolic CHF, current smoker,    PT Comments    Pt received in chair, pt agreeable to therapy session with encouragement, with emphasis on transfer safety, use of orthowedge shoe for any OOB mobility, WB precs (NWB for gait, heel WB for transfers only with shoe donned), gait safety with RW. Pt needing up to min guard for safety to progress to gait training with RW, mainly due to impulsivity and poor receptiveness to safety instruction. Reinforced importance of donning forefoot offloading shoe prior to mobilizing even for pivot transfers, pt will need reinforcement. MD arriving at end of session for dressing change, pt will need to perform stair trial next session to determine if he is safe to DC home, until then continue to recommend short term lower intensity post-acute rehab as pt very limited in safe mobility by c/o severe pain and continuing to require IV meds to tolerate OOB mobility. DME recs updated below per discussion with supervising PT Cyndi W.  Recommendations for follow up therapy are one component of a multi-disciplinary discharge planning process, led by the attending physician.  Recommendations may be updated based on patient status, additional functional criteria and insurance authorization.  Follow Up Recommendations  Can patient physically be transported by private vehicle: Yes    Assistance Recommended at Discharge Frequent or constant Supervision/Assistance  Patient can  return home with the following A little help with bathing/dressing/bathroom;Help with stairs or ramp for entrance;Assistance with cooking/housework;A little help with walking and/or transfers   Equipment Recommendations  Rolling walker (2 wheels) (recommend he get a ramp for his stairs)    Recommendations for Other Services       Precautions / Restrictions Precautions Precautions: Fall Required Braces or Orthoses: Other Brace Other Brace: Wedged post op shoe Restrictions Weight Bearing Restrictions: Yes LLE Weight Bearing: Non weight bearing (except PWB through heel for transfers)     Mobility  Bed Mobility Overal bed mobility: Modified Independent             General bed mobility comments: no physical assistance needed    Transfers Overall transfer level: Needs assistance Equipment used: Rolling walker (2 wheels), None Transfers: Sit to/from Stand, Bed to chair/wheelchair/BSC Sit to Stand: Min guard   Step pivot transfers: Supervision       General transfer comment: Pt impulsive to pivot from chair>bed with heel WB and step pivot, pt ignoring PTA cues to wait until post-op wedge shoe donned prior to performing. Once at chair, pt allowing PTA to assist him to don post-op shoe. Min guard for safety progressing to Supervision for sit>stand and stand>sit to RW.    Ambulation/Gait Ambulation/Gait assistance: Supervision Gait Distance (Feet): 45 Feet Assistive device: Rolling walker (2 wheels) Gait Pattern/deviations:  (hop-to)       General Gait Details: Supervision using RW, pt distance limited according to pain levels.   Stairs Stairs:  (defer, MD entering room to perform dressing change and pt c/o increased pain in LLE post-exertion)           Wheelchair Mobility  Modified Rankin (Stroke Patients Only)       Balance Overall balance assessment: Needs assistance Sitting-balance support: No upper extremity supported, Feet supported Sitting  balance-Leahy Scale: Good     Standing balance support: Bilateral upper extremity supported, During functional activity, Reliant on assistive device for balance Standing balance-Leahy Scale: Poor Standing balance comment: reliant on UE support RW due to NWB on LLE                            Cognition Arousal/Alertness: Awake/alert Behavior During Therapy: Impulsive Overall Cognitive Status: Impaired/Different from baseline Area of Impairment: Safety/judgement                         Safety/Judgement: Decreased awareness of safety, Decreased awareness of deficits   Problem Solving: Decreased initiation General Comments: limited by pain, pt ignoring most therapist cues and very self-directed.        Exercises      General Comments General comments (skin integrity, edema, etc.): BP 132/83 (97) on RUE after squat/step pivot from chair>bed, no increased c/o dizziness with gait this date.      Pertinent Vitals/Pain Pain Assessment Pain Assessment: Faces Faces Pain Scale: Hurts whole lot Pain Location: L foot in dependent posture Pain Descriptors / Indicators: Pounding, Throbbing Pain Intervention(s): Limited activity within patient's tolerance, Monitored during session, Repositioned, RN gave pain meds during session (RN gave IV meds mid-session)           PT Goals (current goals can now be found in the care plan section) Acute Rehab PT Goals Patient Stated Goal: to go home soon PT Goal Formulation: With patient Time For Goal Achievement: 11/23/22 Progress towards PT goals: Progressing toward goals    Frequency    Min 1X/week      PT Plan Equipment recommendations need to be updated       AM-PAC PT "6 Clicks" Mobility   Outcome Measure  Help needed turning from your back to your side while in a flat bed without using bedrails?: None Help needed moving from lying on your back to sitting on the side of a flat bed without using bedrails?:  None Help needed moving to and from a bed to a chair (including a wheelchair)?: A Little Help needed standing up from a chair using your arms (e.g., wheelchair or bedside chair)?: A Little Help needed to walk in hospital room?: A Little Help needed climbing 3-5 steps with a railing? : Total 6 Click Score: 18    End of Session Equipment Utilized During Treatment: Gait belt Activity Tolerance: Patient tolerated treatment well;Patient limited by pain Patient left: in bed;with call bell/phone within reach;with bed alarm set;Other (comment) (LLE elevated) Nurse Communication: Mobility status;Patient requests pain meds PT Visit Diagnosis: Other abnormalities of gait and mobility (R26.89);Pain Pain - Right/Left: Left Pain - part of body: Ankle and joints of foot     Time: 9528-4132 PT Time Calculation (min) (ACUTE ONLY): 14 min  Charges:  $Gait Training: 8-22 mins                     Calliope Delangel P., PTA Acute Rehabilitation Services Secure Chat Preferred 9a-5:30pm Office: [phone]    Dorathy Kinsman [facility] 11/10/2022, 5:40 PM

## 2022-11-10 NOTE — Anesthesia Postprocedure Evaluation (Signed)
Anesthesia Post Note  Patient: Kenneth Bailey  Procedure(s) Performed: IRRIGATION AND DEBRIDEMENT OF FOOT AND WASHOUT (Left)     Patient location during evaluation: PACU Anesthesia Type: General Level of consciousness: awake and alert Pain management: pain level controlled Vital Signs Assessment: post-procedure vital signs reviewed and stable Respiratory status: spontaneous breathing, nonlabored ventilation, respiratory function stable and patient connected to nasal cannula oxygen Cardiovascular status: blood pressure returned to baseline and stable Postop Assessment: no apparent nausea or vomiting Anesthetic complications: no   There were no known notable events for this encounter.  Last Vitals:  Vitals:   11/10/22 1254 11/10/22 1300  BP: (!) 80/57 107/62  Pulse:    Resp:    Temp: 36.4 C   SpO2:      Last Pain:  Vitals:   11/10/22 1254  TempSrc: Oral  PainSc:                  [name]

## 2022-11-11 DIAGNOSIS — A419 Sepsis, unspecified organism: Secondary | ICD-10-CM

## 2022-11-11 LAB — CBC
HCT: 24.9 % — ABNORMAL LOW (ref 39.0–52.0)
Hemoglobin: 8.5 g/dL — ABNORMAL LOW (ref 13.0–17.0)
MCH: 29.2 pg (ref 26.0–34.0)
MCHC: 34.1 g/dL (ref 30.0–36.0)
MCV: 85.6 fL (ref 80.0–100.0)
Platelets: 365 10*3/uL (ref 150–400)
RBC: 2.91 MIL/uL — ABNORMAL LOW (ref 4.22–5.81)
RDW: 14.4 % (ref 11.5–15.5)
WBC: 16.4 10*3/uL — ABNORMAL HIGH (ref 4.0–10.5)
nRBC: 0 % (ref 0.0–0.2)

## 2022-11-11 LAB — GLUCOSE, CAPILLARY
Glucose-Capillary: 198 mg/dL — ABNORMAL HIGH (ref 70–99)
Glucose-Capillary: 262 mg/dL — ABNORMAL HIGH (ref 70–99)
Glucose-Capillary: 309 mg/dL — ABNORMAL HIGH (ref 70–99)
Glucose-Capillary: 287 mg/dL — ABNORMAL HIGH (ref 70–99)

## 2022-11-11 LAB — AEROBIC/ANAEROBIC CULTURE W GRAM STAIN (SURGICAL/DEEP WOUND)

## 2022-11-11 NOTE — Progress Notes (Signed)
PROGRESS NOTE    Kenneth Bailey  ZOX:[id number] DOB: 1966-02-15 DOA: 10/30/2022 PCP: [name], MD  Brief Narrative:  57 year old male with history of uncontrolled diabetes type 2, coronary artery disease status post CABG, chronic combined systolic/diastolic CHF, tobacco abuse who presented with complaint of persistent left foot pain, concern for osteomyelitis of left fifth toe as per recommendation of his podiatrist.  He was found to have a superficial ulceration at the lateral plantar aspect of the fifth metatarsal with underlying early osteomyelitis of the fifth metatarsal head as well as a phlegmon and early abscess fibrillation.  Seen by podiatry, status post left foot incision and drainage with resection of fifth metatarsal head on 6/11 with cultures showed MRSA.  ID, vascular surgery also consulted for concern for peripheral artery disease.  Hospital course was remarkable for nausea, vomiting, hematemesis, GI consulted, underwent EGD and found to have reflux esophagitis, nonbleeding gastric ulcer.  Hospital course remarkable for persistent left foot pain, podiatry repeated I&D of foot and washout on 6/19.  PT/OT recommending SNF on discharge.   Assessment & Plan:   Principal Problem:   Osteomyelitis (HCC) Active Problems:   Abscess of left foot   Gastric ulcer   Duodenal ulcer   [place] grade D esophagitis   Left fifth toe/lateral foot cellulitis with osteomyelitis: Podiatry following.  Status post left foot I&D and resection of fifth metatarsal on 6/11.  Wound culture showed MRSA.  ID was also consulted, he got few days of vancomycin, cefepime.  Given oritavancin , plan was for starting doxycycline 11/11/2022 for 14 days. Hospital course remarkable for persistent left foot pain.Hospital course remarkable for persistent left foot pain, podiatry repeated I&D of foot and washout on 6/19.  Found to have a lot of necrotic tissue.  Wound cultures repeated,prelim is showing staph.   ID reconsulted because of worsening leukocytosis, concern for nonhealing wound.  Case discussed with Dr. Danelle Earthly, she recommended 6 weeks of Augmentin and doxycycline .   Peripheral artery disease: Vascular surgery was following.  Status post abdominal aortogram, left posterior tibial artery and tibioperoneal trunk angioplasty.  Currently on aspirin, Plavix.   GI bleed:Hospital course was remarkable for nausea, vomiting, hematemesis, black stools, GI consulted, underwent EGD and found to have reflux esophagitis, nonbleeding gastric ulcer.  Currently on Protonix.  Hemoglobin dropped to the range of 7.5 today but no new episode of hematochezia or melena.  Continue to monitor   Chronic combined systolic/diastolic CHF: Currently euvolemic   Tobacco abuse: Continue nicotine patch   Hyperlipidemia: On gemfibrozil,ezitimib   Coronary artery disease status post CABG: No anginal symptoms.  Currently on aspirin, Plavix   Uncontrolled diabetes type 2: Recent A1c of 10.7.  Monitor blood sugars.  Continue current insulin regimen.  Diabetic coordinator following   Hyperkalemia/AKI/hyponatremia: Stable now after iv fluids were given   Debility/deconditioning: Patient lives with his mother.  PT is recommending SNF on discharge.  TOC following  Estimated body mass index is 24.27 kg/m as calculated from the following:   Height as of this encounter: 5\' 8"  (1.727 m).   Weight as of this encounter: 72.4 kg.  DVT prophylaxis: scd Code Status:full Family Communication: none Disposition Plan:  Status is: Inpatient Remains inpatient appropriate because: snf   Consultants: id gi vascular podiatry  Procedures: egd angioplasty I and d Antimicrobials:  Anti-infectives (From admission, onward)    Start     Dose/Rate Route Frequency Ordered Stop   11/11/22 1000  doxycycline (VIBRA-TABS) tablet  100 mg  Status:  Discontinued        100 mg Oral Every 12 hours 11/03/22 1616 11/10/22 0800   11/11/22 0000   doxycycline (VIBRAMYCIN) 100 MG capsule  Status:  Discontinued        100 mg Oral 2 times daily 11/07/22 1624 11/09/22    11/10/22 0800  doxycycline (VIBRA-TABS) tablet 100 mg        100 mg Oral Every 12 hours 11/10/22 0800 12/22/22 0959   11/09/22 1045  amoxicillin-clavulanate (AUGMENTIN) 875-125 MG per tablet 1 tablet        1 tablet Oral Every 12 hours 11/09/22 0957 12/21/22 0959   11/09/22 0000  doxycycline (VIBRAMYCIN) 100 MG capsule        100 mg Oral 2 times daily 11/09/22 1424 12/21/22 2359   11/09/22 0000  amoxicillin-clavulanate (AUGMENTIN) 875-125 MG tablet        1 tablet Oral 2 times daily 11/09/22 1424 12/21/22 2359   11/04/22 0800  Oritavancin Diphosphate (ORBACTIV) 1,200 mg in dextrose 5 % IVPB        1,200 mg 333.3 mL/hr over 180 Minutes Intravenous Once 11/03/22 1611 11/04/22 1158   11/03/22 0500  vancomycin (VANCOREADY) IVPB 1500 mg/300 mL  Status:  Discontinued        1,500 mg 150 mL/hr over 120 Minutes Intravenous Every 24 hours 11/02/22 1439 11/03/22 1611   11/02/22 1445  ceFEPIme (MAXIPIME) 2 g in sodium chloride 0.9 % 100 mL IVPB  Status:  Discontinued        2 g 200 mL/hr over 30 Minutes Intravenous Every 8 hours 11/02/22 1437 11/03/22 1430   11/02/22 0500  vancomycin (VANCOREADY) IVPB 1250 mg/250 mL  Status:  Discontinued        1,250 mg 166.7 mL/hr over 90 Minutes Intravenous Every 24 hours 11/01/22 0721 11/02/22 1439   11/01/22 0430  vancomycin (VANCOCIN) IVPB 1000 mg/200 mL premix        1,000 mg 200 mL/hr over 60 Minutes Intravenous  Once 11/01/22 0339 11/01/22 0620   11/01/22 0430  ceFEPIme (MAXIPIME) 2 g in sodium chloride 0.9 % 100 mL IVPB  Status:  Discontinued        2 g 200 mL/hr over 30 Minutes Intravenous Every 12 hours 11/01/22 0342 11/02/22 1437   11/01/22 0400  ceFEPIme (MAXIPIME) 2 g in sodium chloride 0.9 % 100 mL IVPB  Status:  Discontinued        2 g 200 mL/hr over 30 Minutes Intravenous Every 24 hours 10/31/22 0342 11/01/22 0341   10/31/22  0430  ceFEPIme (MAXIPIME) 2 g in sodium chloride 0.9 % 100 mL IVPB        2 g 200 mL/hr over 30 Minutes Intravenous  Once 10/31/22 0342 10/31/22 0434   10/30/22 2138  vancomycin variable dose per unstable renal function (pharmacist dosing)  Status:  Discontinued         Does not apply See admin instructions 10/30/22 2138 11/01/22 0721   10/30/22 1745  vancomycin (VANCOREADY) IVPB 1500 mg/300 mL        1,500 mg 150 mL/hr over 120 Minutes Intravenous  Once 10/30/22 1743 10/30/22 2058        Subjective: Walking to rest room with walker  Left foot in postop shoe  Objective: Vitals:   11/10/22 1949 11/11/22 0004 11/11/22 0449 11/11/22 0500  BP: 119/66 112/66 128/68   Pulse: 74 68 72   Resp: 18 18 18    Temp: 99.1 F (37.3  C) 98.6 F (37 C) 98.5 F (36.9 C)   TempSrc: Oral Oral Oral   SpO2: 100% 100% 100%   Weight:    72.4 kg  Height:        Intake/Output Summary (Last 24 hours) at 11/11/2022 1045 Last data filed at 11/11/2022 0006 Gross per 24 hour  Intake 240 ml  Output 700 ml  Net -460 ml   Filed Weights   11/08/22 0626 11/09/22 0353 11/11/22 0500  Weight: 72.5 kg 73 kg 72.4 kg    Examination:  General exam: Appears in nad Respiratory system: Clear to auscultation. Respiratory effort normal. Cardiovascular system: S1 & S2 heard, RRR. No JVD, murmurs, rubs, gallops or clicks. No pedal edema. Gastrointestinal system: Abdomen is nondistended, soft and nontender. No organomegaly or masses felt. Normal bowel sounds heard. Central nervous system: Alert and oriented. No focal neurological deficits. Extremities:  left foot covered with dressing . Skin: No rashes, lesions or ulcers Psychiatry: Judgement and insight appear normal. Mood & affect appropriate.     Data Reviewed: I have personally reviewed following labs and imaging studies  CBC: Recent Labs  Lab 11/04/22 1450 11/04/22 1943 11/05/22 0547 11/05/22 1420 11/06/22 0153 11/06/22 0834 11/07/22 0136  11/07/22 0733 11/08/22 0109 11/09/22 0120 11/10/22 0138 11/11/22 0136  WBC 17.5*  --  13.0*  --  11.4*  --  13.2*  --  14.4* 19.7* 14.6* 16.4*  NEUTROABS 14.9*  --  9.7*  --  8.5*  --  9.2*  --  11.8*  --   --   --   HGB 7.0*   < > 9.0*   < > 7.1*   < > 9.7* 9.9* 9.1* 9.6* 7.5* 8.5*  HCT 21.0*   < > 25.7*   < > 21.2*   < > 28.2* 29.4* 26.9* 29.5* 22.8* 24.9*  MCV 84.7  --  82.4  --  85.1  --  84.4  --  83.3 88.1 85.4 85.6  PLT 374  --  248  --  225  --  272  --  298 336 298 365   < > = values in this interval not displayed.   Basic Metabolic Panel: Recent Labs  Lab 11/05/22 0547 11/06/22 0153 11/07/22 0136 11/08/22 0109 11/09/22 0120 11/10/22 0138  NA 135 132* 132* 130* 129* 131*  K 4.1 4.2 4.4 4.4 5.8* 4.5  CL 98 99 95* 95* 93* 95*  CO2 21* 23 26 24 25 23   GLUCOSE 215* 294* 177* 288* 269* 218*  BUN 58* 40* 27* 24* 32* 33*  CREATININE 1.08 1.29* 1.04 1.22 1.53* 1.23  CALCIUM 8.5* 8.2* 9.0 9.1 9.3 8.8*  MG 1.7 1.7 1.9 1.7  --   --   PHOS 3.2 3.5 4.0 3.7  --   --    GFR: Estimated Creatinine Clearance: 65.7 mL/min (by C-G formula based on SCr of 1.23 mg/dL). Liver Function Tests: Recent Labs  Lab 11/04/22 1450 11/05/22 0547 11/06/22 0153 11/07/22 0136 11/08/22 0109  AST 15 11* 30 36 19  ALT 15 11 27  41 30  ALKPHOS 63 50 59 72 78  BILITOT 0.4 0.5 0.4 0.6 0.5  PROT 6.4* 5.5* 5.2* 6.3* 6.4*  ALBUMIN 2.4* 2.2* 2.0* 2.5* 2.5*   No results for input(s): "LIPASE", "AMYLASE" in the last 168 hours. No results for input(s): "AMMONIA" in the last 168 hours. Coagulation Profile: Recent Labs  Lab 11/04/22 1450  INR 1.2   Cardiac Enzymes: No results for input(s): "CKTOTAL", "CKMB", "  CKMBINDEX", "TROPONINI" in the last 168 hours. BNP (last 3 results) No results for input(s): "PROBNP" in the last 8760 hours. HbA1C: No results for input(s): "HGBA1C" in the last 72 hours. CBG: Recent Labs  Lab 11/10/22 0829 11/10/22 1154 11/10/22 1620 11/10/22 2114 11/11/22 0625   GLUCAP 106* 256* 169* 224* 198*   Lipid Profile: No results for input(s): "CHOL", "HDL", "LDLCALC", "TRIG", "CHOLHDL", "LDLDIRECT" in the last 72 hours. Thyroid Function Tests: No results for input(s): "TSH", "T4TOTAL", "FREET4", "T3FREE", "THYROIDAB" in the last 72 hours. Anemia Panel: No results for input(s): "VITAMINB12", "FOLATE", "FERRITIN", "TIBC", "IRON", "RETICCTPCT" in the last 72 hours. Sepsis Labs: Recent Labs  Lab 11/04/22 1450 11/04/22 1730  LATICACIDVEN 1.8 1.3    Recent Results (from the past 240 hour(s))  Surgical pcr screen     Status: None   Collection Time: 11/03/22  7:07 AM   Specimen: Nasal Mucosa; Nasal Swab  Result Value Ref Range Status   MRSA, PCR NEGATIVE NEGATIVE Final   Staphylococcus aureus NEGATIVE NEGATIVE Final    Comment: (NOTE) The Xpert SA Assay (FDA approved for NASAL specimens in patients 35 years of age and older), is one component of a comprehensive surveillance program. It is not intended to diagnose infection nor to guide or monitor treatment. Performed at [facility], [address] [place], [place] [address]   Culture, blood (x 2)     Status: None   Collection Time: 11/04/22  2:50 PM   Specimen: BLOOD  Result Value Ref Range Status   Specimen Description BLOOD SITE NOT SPECIFIED  Final   Special Requests   Final    BOTTLES DRAWN AEROBIC AND ANAEROBIC Blood Culture results may not be optimal due to an inadequate volume of blood received in culture bottles   Culture   Final    NO GROWTH 5 DAYS Performed at [facility], [address] [place], [place] [address]    Report Status 11/09/2022 FINAL  Final  Culture, blood (x 2)     Status: None   Collection Time: 11/04/22  2:54 PM   Specimen: BLOOD  Result Value Ref Range Status   Specimen Description BLOOD BLOOD RIGHT HAND  Final   Special Requests   Final    BOTTLES DRAWN AEROBIC AND ANAEROBIC Blood Culture results may not be optimal due to an inadequate volume of  blood received in culture bottles   Culture   Final    NO GROWTH 5 DAYS Performed at [facility], [address], [place], [place] [address]    Report Status 11/09/2022 FINAL  Final  Aerobic/Anaerobic Culture w Gram Stain (surgical/deep wound)     Status: None (Preliminary result)   Collection Time: 11/08/22  6:22 PM   Specimen: Foot, Left; Tissue  Result Value Ref Range Status   Specimen Description ABSCESS LEFT FOOT  Final   Special Requests SWABS SAMPLE A  Final   Gram Stain   Final    ABUNDANT WBC PRESENT, PREDOMINANTLY PMN RARE GRAM POSITIVE COCCI Performed at [facility], [address] [place], [place] [address]    Culture   Final    MODERATE STAPHYLOCOCCUS AUREUS SUSCEPTIBILITIES TO FOLLOW NO ANAEROBES ISOLATED; CULTURE IN PROGRESS FOR 5 DAYS    Report Status PENDING  Incomplete  Aerobic/Anaerobic Culture w Gram Stain (surgical/deep wound)     Status: None (Preliminary result)   Collection Time: 11/08/22  6:23 PM   Specimen: Foot, Left; Tissue  Result  Value Ref Range Status   Specimen Description BONE  Final   Special Requests 5TH  METATARSAL  Final   Gram Stain   Final    NO WBC SEEN NO ORGANISMS SEEN Performed at [facility], [address] [place], [place] [address]    Culture   Final    RARE STAPHYLOCOCCUS AUREUS RARE ENTEROCOCCUS FAECALIS CULTURE REINCUBATED FOR BETTER GROWTH CRITICAL RESULT CALLED TO, READ BACK BY AND VERIFIED WITH: Alene Mires [id number] AT 1232 BY EC NO ANAEROBES ISOLATED; CULTURE IN PROGRESS FOR 5 DAYS    Report Status PENDING  Incomplete      Radiology Studies: No results found.   Scheduled Meds:  amoxicillin-clavulanate  1 tablet Oral Q12H   aspirin EC  81 mg Oral Daily   clopidogrel  75 mg Oral Daily   doxycycline  100 mg Oral Q12H   ezetimibe  10 mg Oral Daily   gemfibrozil  600 mg Oral BID AC   insulin aspart  0-5 Units Subcutaneous QHS   insulin aspart  0-9 Units Subcutaneous TID WC   insulin  glargine-yfgn  15 Units Subcutaneous Daily   melatonin  10 mg Oral QHS   nicotine  14 mg Transdermal Daily   pantoprazole  40 mg Oral BID   sodium chloride flush  3 mL Intravenous Q12H   Continuous Infusions:  sodium chloride       LOS: 12 days   35 min  Alwyn Ren, MD  11/11/2022, 10:45 AM

## 2022-11-11 NOTE — Progress Notes (Signed)
Received message that pt's sister (April) is concern about pt going home with her mom who is 56 yo and unable to help him. She is requesting a call from CM. Per previus CM note, pt is refusing to go to SNF. Met with pt and mom. Pt reports that he is going to stay with his mom when he is DC from the hospital. Mom reports that she has another son who lives with her and he takes care of her. She reports that he cooks, clean, etc. She agrees for him to go home with them. She reports that his brother can assist him. He reports that he has crutches, RW, and the toilet has rails. He reports that he is able to get OOB without assistance. Informed pt that his sister is concern about him going home with their mother. He reports that she doesn't like him. Asked pt if I can contact his sister to discuss the DC plan and he agreed. He reports that he is driving his automatic car to his mom's house. Informed pt that is not safe for him to drive, but he reports that he is just driving the car to his mom's house and he won't drive after that.   Contacted pt's sister (April) at [phone] . She is upset about him going home with her mother who is 70 yo. She reports that her other brother lives with her mom to help her. She reports that her brother needs to return to his house. He lives with 2 women and they can take care of him. She reports that her mom has early dementia and she is not able to decide if he can stay with her or not.   Received call from April. She reports that she talked to her mom. Her mom wants to talk to me again. Met with pt, mom, and April. April inquired about SNF. Explained SNF vs HH. Informed them that [facility] declined the referral because they can't take referrals for wound care. Explained to them that we need to find a [facility] agency that is in net-work with his insurance. Pt agreed with SNF. They prefer a facility in [place]. Notified Joi, SW of the DC plan to SNF. Will continue to monitor to assist  with the DC plan.

## 2022-11-11 NOTE — Progress Notes (Signed)
Physical Therapy Treatment Patient Details Name: Kenneth Bailey MRN: [id number] DOB: 05/26/1965 Today's Date: 11/11/2022   History of Present Illness Pt is a 57 y.o. male admitted on 10/30/2022 with osteomyelitis. He is now s/p Left foot irrigation and debridement with resection of fifth metatarsal head 11/01/22. Pt underwent repeat I&D on 11/08/22. Past medical history significant for uncontrolled type 2 diabetes, diabetic polyneuropathy, coronary artery disease status post CABG, chronic combined diastolic and systolic CHF, current smoker,    PT Comments    Pt initially seen for stair negotiation however pt reports he decided last night to go to his mothers house in which she doesn't have any stairs therefore deferred stair negotiation. Pt remains to defer PTs cues for safety and optimal don/doffing of post op shoe stating "I know how to do this" despite not being able to fasten velcro. Pt with improved ability to adhere to L LE NWB and PWB on heel only. Acute PT to cont to follow.    Recommendations for follow up therapy are one component of a multi-disciplinary discharge planning process, led by the attending physician.  Recommendations may be updated based on patient status, additional functional criteria and insurance authorization.  Follow Up Recommendations  Can patient physically be transported by private vehicle: Yes    Assistance Recommended at Discharge Frequent or constant Supervision/Assistance  Patient can return home with the following A little help with bathing/dressing/bathroom;Help with stairs or ramp for entrance;Assistance with cooking/housework;A little help with walking and/or transfers   Equipment Recommendations  Rolling walker (2 wheels) (recommend he get a ramp for his stairs)    Recommendations for Other Services       Precautions / Restrictions Precautions Precautions: Fall Precaution Comments: pt is on IV meds for pain Required Braces or Orthoses: Other  Brace Other Brace: fore foot off weighting shoe Restrictions Weight Bearing Restrictions: Yes LLE Weight Bearing: Non weight bearing LLE Partial Weight Bearing Percentage or Pounds: through heel for transfers Other Position/Activity Restrictions: through heel per MD order     Mobility  Bed Mobility Overal bed mobility: Modified Independent Bed Mobility: Supine to Sit, Sit to Supine     Supine to sit: Modified independent (Device/Increase time) Sit to supine: Modified independent (Device/Increase time)   General bed mobility comments: no physical assistance needed    Transfers Overall transfer level: Needs assistance Equipment used: Rolling walker (2 wheels), None Transfers: Sit to/from Stand, Bed to chair/wheelchair/BSC Sit to Stand: Supervision           General transfer comment: pt able to maintain L LE NWB during stand and good hand placement to stand up to RW.    Ambulation/Gait Ambulation/Gait assistance: Supervision Gait Distance (Feet): 80 Feet Assistive device: Rolling walker (2 wheels) Gait Pattern/deviations:  (hop-to) Gait velocity: decr Gait velocity interpretation: <1.31 ft/sec, indicative of household ambulator   General Gait Details: Supervision using RW, pt able to amb 40' with L LE NWB then turned around and amb 40' back with WB on heal in post op shoe   Stairs Stairs:  (pt decided last night he was going to his mother's who doesn't have any stairs, therefore deferred stair negotiation)           Wheelchair Mobility    Modified Rankin (Stroke Patients Only)       Balance Overall balance assessment: Needs assistance Sitting-balance support: No upper extremity supported, Feet supported Sitting balance-Leahy Scale: Good     Standing balance support: Bilateral upper extremity supported, During functional  activity, Reliant on assistive device for balance Standing balance-Leahy Scale: Poor Standing balance comment: reliant on UE support  RW due to NWB on LLE                            Cognition Arousal/Alertness: Awake/alert Behavior During Therapy: Impulsive Overall Cognitive Status: Impaired/Different from baseline Area of Impairment: Safety/judgement                         Safety/Judgement: Decreased awareness of safety, Decreased awareness of deficits   Problem Solving: Decreased initiation General Comments: pt ignoring most therapist cues and very self-directed.        Exercises      General Comments General comments (skin integrity, edema, etc.): L foot with dressing on it, VSS      Pertinent Vitals/Pain Pain Assessment Pain Assessment: Faces Faces Pain Scale: Hurts whole lot Pain Location: L foot in dependent posture Pain Descriptors / Indicators: Pounding, Throbbing    Home Living                          Prior Function            PT Goals (current goals can now be found in the care plan section) Acute Rehab PT Goals Patient Stated Goal: to go home soon PT Goal Formulation: With patient Time For Goal Achievement: 11/23/22 Potential to Achieve Goals: Good Progress towards PT goals: Progressing toward goals    Frequency    Min 1X/week      PT Plan Equipment recommendations need to be updated    Co-evaluation              AM-PAC PT "6 Clicks" Mobility   Outcome Measure  Help needed turning from your back to your side while in a flat bed without using bedrails?: None Help needed moving from lying on your back to sitting on the side of a flat bed without using bedrails?: None Help needed moving to and from a bed to a chair (including a wheelchair)?: A Little Help needed standing up from a chair using your arms (e.g., wheelchair or bedside chair)?: A Little Help needed to walk in hospital room?: A Little Help needed climbing 3-5 steps with a railing? : Total 6 Click Score: 18    End of Session Equipment Utilized During Treatment: Gait  belt Activity Tolerance: Patient tolerated treatment well;Patient limited by pain Patient left: in bed;with call bell/phone within reach;with bed alarm set;Other (comment);with nursing/sitter in room (LLE elevated) Nurse Communication: Mobility status;Patient requests pain meds PT Visit Diagnosis: Other abnormalities of gait and mobility (R26.89);Pain Pain - Right/Left: Left Pain - part of body: Ankle and joints of foot     Time: 7829-5621 PT Time Calculation (min) (ACUTE ONLY): 18 min  Charges:  $Gait Training: 8-22 mins                     Lewis Shock, PT, DPT Acute Rehabilitation Services Secure chat preferred Office #: [phone]    Iona Hansen 11/11/2022, 1:32 PM

## 2022-11-11 NOTE — Progress Notes (Signed)
PODIATRY PROGRESS NOTE  NAME Kenneth Bailey MRN [id number] DOB May 04, 1966 DOA 10/30/2022   Reason for consult:  Chief Complaint  Patient presents with   Wound Infection     History of present illness: 57 y.o. male POD #3 s/p return to the OR for I&D, wound debridement.  States he been doing well.  J again discussed with therapy.  No fevers or chills.  Pain is currently controlled.  Vitals:   11/11/22 0004 11/11/22 0449  BP: 112/66 128/68  Pulse: 68 72  Resp: 18 18  Temp: 98.6 F (37 C) 98.5 F (36.9 C)  SpO2: 100% 100%     CBC    Component Value Date/Time   WBC 16.4 (H) 11/11/2022 0136   RBC 2.91 (L) 11/11/2022 0136   HGB 8.5 (L) 11/11/2022 0136   HCT 24.9 (L) 11/11/2022 0136   PLT 365 11/11/2022 0136   MCV 85.6 11/11/2022 0136   MCH 29.2 11/11/2022 0136   MCHC 34.1 11/11/2022 0136   RDW 14.4 11/11/2022 0136   LYMPHSABS 1.7 11/08/2022 0109   MONOABS 0.6 11/08/2022 0109   EOSABS 0.0 11/08/2022 0109   BASOSABS 0.3 (H) 11/08/2022 0109       Latest Ref Rng & Units 11/10/2022    1:38 AM 11/09/2022    1:20 AM 11/08/2022    1:09 AM  BMP  Glucose 70 - 99 mg/dL 086  578  469   BUN 6 - 20 mg/dL 33  32  24   Creatinine 0.61 - 1.24 mg/dL 6.29  5.28  4.13   Sodium 135 - 145 mmol/L 131  129  130   Potassium 3.5 - 5.1 mmol/L 4.5  5.8  4.4   Chloride 98 - 111 mmol/L 95  93  95   CO2 22 - 32 mmol/L 23  25  24    Calcium 8.9 - 10.3 mg/dL 8.8  9.3  9.1      Physical Exam: General: AAOx3, NAD  Dermatology: Status post left fifth metatarsal head excision with sutures intact and central area packed open.  Some mild erythema on the proximal incision.  There is no increased temperature to the foot.  There is no purulence noted.  No significant necrotic tissue at this time.  No purulence noted.  No fluctuation, crepitation, malodor.  No wounds in the contralateral extremity.       Vascular: Unable to palpate pulse on left  Neurological: Sensation  decreased  Musculoskeletal Exam: Mild pain on exam    ASSESSMENT/PLAN OF CARE  POD #2 s/p left 1 debridement, I&D  -White blood cell count is up again today. If there is still further concern for infection of the foot recommend repeat MRI. -ID reconsult.  Doxycycline, Augmentin ending 12/19/2022 -Dressing changed today.  There is still some bloody drainage on the bandage but decreased.  I irrigated the wound with saline.  I packed the corner of 4 x 4 moistened gauze into the wound followed by dry sterile dressing. -Culture 5th metatarsal 6/19-Staph aureus, Enterococcus faecalis -Culture abscess left foot 6/19-Staph aureus -Nonweightbearing except for heel for transfer. -Elevation -Podiatry will continue to follow   Please contact me directly with any questions or concerns.     Kenneth Bailey, DPM [facility]  Dr. Lesia Bailey. Kenneth Bailey, DPM    [address]  [place], [place] [address]                Office [phone]  Fax [phone]   

## 2022-11-11 NOTE — [place] FL2 (Signed)
[place] MEDICAID FL2 LEVEL OF CARE FORM     IDENTIFICATION  Patient Name: Diar Berkel Birthdate: 04-Jun-1965 Sex: male Admission Date (Current Location): 10/30/2022  [facility] and [place] Number:  Producer, television/film/video and Address:  [facility], [address] [place], [place] [address]      Provider Number: [id number]  Attending Physician Name and Address:  Alwyn Ren, MD  Relative Name and Phone Number:       Current Level of Care: Hospital Recommended Level of Care: Skilled Nursing Facility Prior Approval Number:    Date Approved/Denied:   PASRR Number: [id number]  Discharge Plan: SNF    Current Diagnoses: Patient Active Problem List   Diagnosis Date Noted   Sepsis (HCC) 11/11/2022   Gastric ulcer 11/06/2022   Duodenal ulcer 11/06/2022   [place] grade D esophagitis 11/06/2022   Abscess of left foot 11/01/2022   Osteomyelitis (HCC) 10/30/2022   Tobacco abuse 09/29/2022   Hypertension 09/29/2022   HLD (hyperlipidemia) 09/29/2022   Diabetes mellitus, type 2 (HCC) 09/29/2022   COPD (chronic obstructive pulmonary disease) (HCC) 09/29/2022   Atherosclerotic heart disease 09/07/2021   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/11/2021   Acute systolic heart failure (HCC) 06/06/2021   Abscess of perineum 07/17/2018   Type 2 diabetes mellitus with hyperglycemia (HCC) 07/09/2018   Nicotine dependence, unspecified, uncomplicated 03/18/2018   Male erectile dysfunction, unspecified 05/17/2017   Chest pain 12/20/2016   Postural hypotension 12/20/2016   Syncope 12/20/2016   Anemia, unspecified 12/14/2016    Orientation RESPIRATION BLADDER Height & Weight     Self, Time, Situation, Place  Normal Continent, External catheter Weight: 159 lb 9.8 oz (72.4 kg) Height:  5\' 8"  (172.7 cm)  BEHAVIORAL SYMPTOMS/MOOD NEUROLOGICAL BOWEL NUTRITION STATUS      Continent Diet (See dc summary)  AMBULATORY STATUS COMMUNICATION OF NEEDS Skin   Limited Assist Verbally  Normal                       Personal Care Assistance Level of Assistance  Bathing, Feeding, Dressing Bathing Assistance: Limited assistance Feeding assistance: Independent Dressing Assistance: Limited assistance     Functional Limitations Info  Sight, Hearing, Speech Sight Info: Impaired Hearing Info: Adequate Speech Info: Adequate    SPECIAL CARE FACTORS FREQUENCY  PT (By licensed PT), OT (By licensed OT)     PT Frequency: 5xweek OT Frequency: 5xweek            Contractures Contractures Info: Not present    Additional Factors Info  Code Status, Allergies Code Status Info: full Allergies Info: Lisinopril  Statins           Current Medications (11/11/2022):  This is the current hospital active medication list Current Facility-Administered Medications  Medication Dose Route Frequency Provider Last Rate Last Admin   0.9 %  sodium chloride infusion  250 mL Intravenous PRN Jeani Hawking, MD       acetaminophen (TYLENOL) tablet 650 mg  650 mg Oral Q6H PRN Jeani Hawking, MD   650 mg at 11/10/22 0505   alum & mag hydroxide-simeth (MAALOX/MYLANTA) 200-200-20 MG/5ML suspension 15 mL  15 mL Oral Q6H PRN Jeani Hawking, MD   15 mL at 10/31/22 2009   amoxicillin-clavulanate (AUGMENTIN) 875-125 MG per tablet 1 tablet  1 tablet Oral Q12H Danelle Earthly, MD   1 tablet at 11/11/22 0747   aspirin EC tablet 81 mg  81 mg Oral Daily Marguerita Merles [place], DO  81 mg at 11/11/22 0748   calcium carbonate (TUMS - dosed in mg elemental calcium) chewable tablet 200 mg of elemental calcium  1 tablet Oral PRN Jeani Hawking, MD   200 mg of elemental calcium at 11/03/22 1751   clopidogrel (PLAVIX) tablet 75 mg  75 mg Oral Daily Marguerita Merles [place], DO   75 mg at 11/11/22 0747   doxycycline (VIBRA-TABS) tablet 100 mg  100 mg Oral Q12H Paytes, Austin A, RPH   100 mg at 11/11/22 0748   ezetimibe (ZETIA) tablet 10 mg  10 mg Oral Daily Pham, Minh Q, RPH-CPP   10 mg at 11/11/22 0747   gemfibrozil  (LOPID) tablet 600 mg  600 mg Oral BID Joylene Igo, MD   600 mg at 11/11/22 1604   hydrALAZINE (APRESOLINE) injection 5 mg  5 mg Intravenous Q20 Min PRN Jeani Hawking, MD       HYDROmorphone (DILAUDID) injection 0.5 mg  0.5 mg Intravenous Q4H PRN Jeani Hawking, MD   0.5 mg at 11/11/22 1559   insulin aspart (novoLOG) injection 0-5 Units  0-5 Units Subcutaneous QHS Jeani Hawking, MD   2 Units at 11/10/22 2125   insulin aspart (novoLOG) injection 0-9 Units  0-9 Units Subcutaneous TID WC Jeani Hawking, MD   7 Units at 11/11/22 1602   insulin glargine-yfgn (SEMGLEE) injection 15 Units  15 Units Subcutaneous Daily Burnadette Pop, MD   15 Units at 11/11/22 0749   labetalol (NORMODYNE) injection 10 mg  10 mg Intravenous Q10 min PRN Jeani Hawking, MD       melatonin tablet 10 mg  10 mg Oral QHS Jeani Hawking, MD   10 mg at 11/10/22 2125   nicotine (NICODERM CQ - dosed in mg/24 hours) patch 14 mg  14 mg Transdermal Daily Jeani Hawking, MD   14 mg at 11/11/22 0749   ondansetron (ZOFRAN) injection 4 mg  4 mg Intravenous Q6H PRN Jeani Hawking, MD   4 mg at 11/04/22 1305   oxyCODONE (Oxy IR/ROXICODONE) immediate release tablet 5 mg  5 mg Oral Q6H PRN Jeani Hawking, MD   5 mg at 11/11/22 1019   pantoprazole (PROTONIX) EC tablet 40 mg  40 mg Oral BID Pham, Minh Q, RPH-CPP   40 mg at 11/11/22 0748   polyethylene glycol (MIRALAX / GLYCOLAX) packet 17 g  17 g Oral Daily PRN Jeani Hawking, MD       prochlorperazine (COMPAZINE) injection 10 mg  10 mg Intravenous Q6H PRN Jeani Hawking, MD       sodium chloride flush (NS) 0.9 % injection 3 mL  3 mL Intravenous Q12H Jeani Hawking, MD   3 mL at 11/11/22 1610   sodium chloride flush (NS) 0.9 % injection 3 mL  3 mL Intravenous PRN Jeani Hawking, MD         Discharge Medications: Please see discharge summary for a list of discharge medications.  Relevant Imaging Results:  Relevant Lab Results:   Additional Information SSN: [id number]  Oletta Lamas, MSW,  [facility] Transitions of Care  Clinical Social Worker I

## 2022-11-12 DIAGNOSIS — M86172 Other acute osteomyelitis, left ankle and foot: Secondary | ICD-10-CM | POA: Diagnosis not present

## 2022-11-12 LAB — GLUCOSE, CAPILLARY
Glucose-Capillary: 182 mg/dL — ABNORMAL HIGH (ref 70–99)
Glucose-Capillary: 255 mg/dL — ABNORMAL HIGH (ref 70–99)
Glucose-Capillary: 239 mg/dL — ABNORMAL HIGH (ref 70–99)
Glucose-Capillary: 196 mg/dL — ABNORMAL HIGH (ref 70–99)

## 2022-11-12 LAB — COMPREHENSIVE METABOLIC PANEL
ALT: 18 U/L (ref 0–44)
AST: 18 U/L (ref 15–41)
Albumin: 2.6 g/dL — ABNORMAL LOW (ref 3.5–5.0)
Alkaline Phosphatase: 105 U/L (ref 38–126)
Anion gap: 11 (ref 5–15)
BUN: 26 mg/dL — ABNORMAL HIGH (ref 6–20)
CO2: 23 mmol/L (ref 22–32)
Calcium: 9.4 mg/dL (ref 8.9–10.3)
Chloride: 96 mmol/L — ABNORMAL LOW (ref 98–111)
Creatinine, Ser: 1.02 mg/dL (ref 0.61–1.24)
GFR, Estimated: >60 mL/min (ref >60)
Glucose, Bld: 241 mg/dL — ABNORMAL HIGH (ref 70–99)
Potassium: 4.5 mmol/L (ref 3.5–5.1)
Sodium: 130 mmol/L — ABNORMAL LOW (ref 135–145)
Total Bilirubin: 0.4 mg/dL (ref 0.3–1.2)
Total Protein: 6.9 g/dL (ref 6.5–8.1)

## 2022-11-12 LAB — CBC
HCT: 25.7 % — ABNORMAL LOW (ref 39.0–52.0)
Hemoglobin: 8.6 g/dL — ABNORMAL LOW (ref 13.0–17.0)
MCH: 28.7 pg (ref 26.0–34.0)
MCHC: 33.5 g/dL (ref 30.0–36.0)
MCV: 85.7 fL (ref 80.0–100.0)
Platelets: 374 10*3/uL (ref 150–400)
RBC: 3 MIL/uL — ABNORMAL LOW (ref 4.22–5.81)
RDW: 14.3 % (ref 11.5–15.5)
WBC: 12.7 10*3/uL — ABNORMAL HIGH (ref 4.0–10.5)
nRBC: 0 % (ref 0.0–0.2)

## 2022-11-12 LAB — AEROBIC/ANAEROBIC CULTURE W GRAM STAIN (SURGICAL/DEEP WOUND)

## 2022-11-12 MED ORDER — SENNOSIDES-DOCUSATE SODIUM 8.6-50 MG PO TABS
2.0000 | ORAL_TABLET | Freq: Two times a day (BID) | ORAL | Status: AC
  Administered 2022-11-12 – 2022-11-13 (×4): 2 via ORAL
  Filled 2022-11-12 (×4): qty 2

## 2022-11-12 NOTE — Progress Notes (Signed)
PODIATRY PROGRESS NOTE  NAME Kenneth Bailey MRN [id number] DOB 11-19-1965 DOA 10/30/2022   Reason for consult:  Chief Complaint  Patient presents with   Wound Infection     History of present illness: 57 y.o. male POD #4 s/p return to the OR for I&D, wound debridement.  States he been doing well.  He is having a little pain today.  No fevers or chills.  Vitals:   11/12/22 0015 11/12/22 0928  BP: 129/65 (!) 120/52  Pulse: 61 72  Resp: 18 18  Temp: 98.6 F (37 C) 98.6 F (37 C)  SpO2: 98% 100%      CBC    Component Value Date/Time   WBC 12.7 (H) 11/12/2022 0153   RBC 3.00 (L) 11/12/2022 0153   HGB 8.6 (L) 11/12/2022 0153   HCT 25.7 (L) 11/12/2022 0153   PLT 374 11/12/2022 0153   MCV 85.7 11/12/2022 0153   MCH 28.7 11/12/2022 0153   MCHC 33.5 11/12/2022 0153   RDW 14.3 11/12/2022 0153   LYMPHSABS 1.7 11/08/2022 0109   MONOABS 0.6 11/08/2022 0109   EOSABS 0.0 11/08/2022 0109   BASOSABS 0.3 (H) 11/08/2022 0109        Latest Ref Rng & Units 11/12/2022    1:53 AM 11/10/2022    1:38 AM 11/09/2022    1:20 AM  BMP  Glucose 70 - 99 mg/dL 086  578  469   BUN 6 - 20 mg/dL 26  33  32   Creatinine 0.61 - 1.24 mg/dL 6.29  5.28  4.13   Sodium 135 - 145 mmol/L 130  131  129   Potassium 3.5 - 5.1 mmol/L 4.5  4.5  5.8   Chloride 98 - 111 mmol/L 96  95  93   CO2 22 - 32 mmol/L 23  23  25    Calcium 8.9 - 10.3 mg/dL 9.4  8.8  9.3       Physical Exam: General: AAOx3, NAD  Dermatology: Status post left fifth metatarsal head excision with sutures intact and central area packed open.  Some mild erythema on the proximal incision, which has not worsened compared to yesterday.  There is no increased temperature to the foot.  There is no purulence noted.  No significant necrotic tissue at this time.  No purulence noted.  No fluctuation, crepitation, malodor.  No wounds in the contralateral extremity.  Overall wound appears to be about the same compared to  yesterday.   Vascular: Unable to palpate pulse on left  Neurological: Sensation decreased  Musculoskeletal Exam: Mild pain on exam    ASSESSMENT/PLAN OF CARE  POD #3 s/p left 1 debridement, I&D  -White blood cell count improved today.  If there is further concern for infection recommend MRI. -ID reconsult.  Doxycycline, Augmentin ending 12/19/2022 -Dressing changed today.  There is still some bloody drainage on the bandage but decreased.  I irrigated the wound with saline.  I packed the corner of 4 x 4 moistened gauze into the wound followed by dry sterile dressing. -Culture 5th metatarsal 6/19-Staph aureus, Enterococcus faecalis -Culture abscess left foot 6/19-MRSA -Path- pending  -Nonweightbearing except for heel for transfer. -Elevation -Podiatry will continue to follow.  From podiatry standpoint as long as white blood cell count continues to stabilize and afebrile able to be discharged with close follow-up.  He will need home health care upon discharge.  Instructions for home health would be irrigate the wound with saline.  Packed the surgical portion of  the wound covering with Betadine to the incision followed by dry sterile dressing.  Change daily.   Please contact me directly with any questions or concerns.     Ovid Curd, DPM [facility]  Dr. Lesia Sago. Keegan Ducey, DPM    [address] [place], [place] [address]                Office [phone]  Fax [phone]

## 2022-11-12 NOTE — Progress Notes (Signed)
PROGRESS NOTE    Kenneth Bailey  QMV:[id number] DOB: 11-09-1965 DOA: 10/30/2022 PCP: [name], MD  Brief Narrative:  57 year old male with history of uncontrolled diabetes type 2, coronary artery disease status post CABG, chronic combined systolic/diastolic CHF, tobacco abuse who presented with complaint of persistent left foot pain, concern for osteomyelitis of left fifth toe as per recommendation of his podiatrist.  He was found to have a superficial ulceration at the lateral plantar aspect of the fifth metatarsal with underlying early osteomyelitis of the fifth metatarsal head as well as a phlegmon and early abscess fibrillation.  Seen by podiatry, status post left foot incision and drainage with resection of fifth metatarsal head on 6/11 with cultures showed MRSA.  ID, vascular surgery also consulted for concern for peripheral artery disease.  Hospital course was remarkable for nausea, vomiting, hematemesis, GI consulted, underwent EGD and found to have reflux esophagitis, nonbleeding gastric ulcer.  Hospital course remarkable for persistent left foot pain, podiatry repeated I&D of foot and washout on 6/19.  PT/OT recommending SNF on discharge.  11/12/2022: The patient was seen and examined at his bedside.  He has no new complaints.  POD #4 status post return to the OR for I&D, wound debridement.  Left foot pain is well-controlled.   Assessment & Plan:   Principal Problem:   Osteomyelitis (HCC) Active Problems:   Abscess of left foot   Gastric ulcer   Duodenal ulcer   [place] grade D esophagitis   Sepsis (HCC)   Left fifth toe/lateral foot cellulitis with osteomyelitis:  Podiatry following.  Status post left foot I&D and resection of fifth metatarsal on 6/11.  Wound culture showed MRSA.  ID was also consulted, he got few days of vancomycin, cefepime.  Given oritavancin, plan was for starting doxycycline 11/11/2022 for 14 days. Hospital course remarkable for persistent left  foot pain.Hospital course remarkable for persistent left foot pain, podiatry repeated I&D of foot and washout on 6/19.  Found to have a lot of necrotic tissue.  Wound cultures repeated, prelim is showing staph.  ID reconsulted because of worsening leukocytosis, concern for nonhealing wound.  Dr. Ashley Royalty discussed the case with ID Dr. Danelle Earthly, she recommended 6 weeks of Augmentin and doxycycline .   Peripheral artery disease:  Vascular surgery was following.  Status post abdominal aortogram, left posterior tibial artery and tibioperoneal trunk angioplasty.  Currently on aspirin, Plavix, Zetia and Lopid.   GI bleed: Hospital course was remarkable for nausea, vomiting, hematemesis, black stools, GI consulted, underwent EGD and found to have reflux esophagitis, nonbleeding gastric ulcer.  Currently on Protonix.  Hemoglobin dropped to the range of 7.5 on 11/10/2022 but no new episode of hematochezia or melena.  Continue to monitor Hemoglobin 8.6 from 8.5 on 11/11/2022. No overt bleeding reported today. Continue twice daily p.o. Protonix   Chronic combined systolic/diastolic CHF:  Last 2D echo on 09/12/2022 with LVEF 45 to 50% with grade 1 diastolic dysfunction. Currently euvolemic Strict I's and O's and daily weight, currently not recorded   Tobacco abuse:  Continue nicotine patch   Hyperlipidemia:  On gemfibrozil, ezitimibe   Coronary artery disease status post CABG:  No anginal symptoms.  Currently on aspirin, Plavix, ezetimibe and Lopid   Uncontrolled diabetes type 2:  Recent A1c of 10.7.  Monitor blood sugars.  Continue current insulin regimen.  Diabetic coordinator following   Resolved hyperkalemia/AKI Stable now after iv fluids were given  Mild hyponatremia Serum sodium 130, serum glucose 241 Corrected  sodium for hyperglycemia 132. Continue to monitor.   Debility/deconditioning: Patient lives with his mother.  PT is recommending SNF on discharge.  TOC following    Time:  35 minutes.  Estimated body mass index is 24.4 kg/m as calculated from the following:   Height as of this encounter: 5\' 8"  (1.727 m).   Weight as of this encounter: 72.8 kg.  DVT prophylaxis: scds Code Status:full Family Communication: None at bedside. Disposition Plan:  Status is: Inpatient Remains inpatient appropriate because: snf   Consultants: id gi vascular podiatry  Procedures: egd angioplasty I and d Antimicrobials:  Anti-infectives (From admission, onward)    Start     Dose/Rate Route Frequency Ordered Stop   11/11/22 1000  doxycycline (VIBRA-TABS) tablet 100 mg  Status:  Discontinued        100 mg Oral Every 12 hours 11/03/22 1616 11/10/22 0800   11/11/22 0000  doxycycline (VIBRAMYCIN) 100 MG capsule  Status:  Discontinued        100 mg Oral 2 times daily 11/07/22 1624 11/09/22    11/10/22 0800  doxycycline (VIBRA-TABS) tablet 100 mg        100 mg Oral Every 12 hours 11/10/22 0800 12/22/22 0959   11/09/22 1045  amoxicillin-clavulanate (AUGMENTIN) 875-125 MG per tablet 1 tablet        1 tablet Oral Every 12 hours 11/09/22 0957 12/21/22 0959   11/09/22 0000  doxycycline (VIBRAMYCIN) 100 MG capsule        100 mg Oral 2 times daily 11/09/22 1424 12/21/22 2359   11/09/22 0000  amoxicillin-clavulanate (AUGMENTIN) 875-125 MG tablet        1 tablet Oral 2 times daily 11/09/22 1424 12/21/22 2359   11/04/22 0800  Oritavancin Diphosphate (ORBACTIV) 1,200 mg in dextrose 5 % IVPB        1,200 mg 333.3 mL/hr over 180 Minutes Intravenous Once 11/03/22 1611 11/04/22 1158   11/03/22 0500  vancomycin (VANCOREADY) IVPB 1500 mg/300 mL  Status:  Discontinued        1,500 mg 150 mL/hr over 120 Minutes Intravenous Every 24 hours 11/02/22 1439 11/03/22 1611   11/02/22 1445  ceFEPIme (MAXIPIME) 2 g in sodium chloride 0.9 % 100 mL IVPB  Status:  Discontinued        2 g 200 mL/hr over 30 Minutes Intravenous Every 8 hours 11/02/22 1437 11/03/22 1430   11/02/22 0500  vancomycin (VANCOREADY)  IVPB 1250 mg/250 mL  Status:  Discontinued        1,250 mg 166.7 mL/hr over 90 Minutes Intravenous Every 24 hours 11/01/22 0721 11/02/22 1439   11/01/22 0430  vancomycin (VANCOCIN) IVPB 1000 mg/200 mL premix        1,000 mg 200 mL/hr over 60 Minutes Intravenous  Once 11/01/22 0339 11/01/22 0620   11/01/22 0430  ceFEPIme (MAXIPIME) 2 g in sodium chloride 0.9 % 100 mL IVPB  Status:  Discontinued        2 g 200 mL/hr over 30 Minutes Intravenous Every 12 hours 11/01/22 0342 11/02/22 1437   11/01/22 0400  ceFEPIme (MAXIPIME) 2 g in sodium chloride 0.9 % 100 mL IVPB  Status:  Discontinued        2 g 200 mL/hr over 30 Minutes Intravenous Every 24 hours 10/31/22 0342 11/01/22 0341   10/31/22 0430  ceFEPIme (MAXIPIME) 2 g in sodium chloride 0.9 % 100 mL IVPB        2 g 200 mL/hr over 30 Minutes Intravenous  Once 10/31/22  1610 10/31/22 0434   10/30/22 2138  vancomycin variable dose per unstable renal function (pharmacist dosing)  Status:  Discontinued         Does not apply See admin instructions 10/30/22 2138 11/01/22 0721   10/30/22 1745  vancomycin (VANCOREADY) IVPB 1500 mg/300 mL        1,500 mg 150 mL/hr over 120 Minutes Intravenous  Once 10/30/22 1743 10/30/22 2058         Objective: Vitals:   11/12/22 0015 11/12/22 0440 11/12/22 0928 11/12/22 1233  BP: 129/65  (!) 120/52 133/70  Pulse: 61  72 77  Resp: 18  18 18   Temp: 98.6 F (37 C)  98.6 F (37 C) 97.9 F (36.6 C)  TempSrc: Oral  Oral Oral  SpO2: 98%  100% 100%  Weight:  72.8 kg    Height:       No intake or output data in the 24 hours ending 11/12/22 1308  Filed Weights   11/09/22 0353 11/11/22 0500 11/12/22 0440  Weight: 73 kg 72.4 kg 72.8 kg    Examination:  General exam: Well-developed well-nourished in no acute distress.  He is alert oriented x 3. Respiratory system: Clear to auscultation with no wheezes or rales. Cardiovascular system: Regular rate and rhythm no rubs or gallops. Gastrointestinal system: Soft  nontender with normal bowel sounds present.   Central nervous system: Alert oriented. Extremities: Left foot covered with dressing. Psychiatry: Mood is appropriate for condition and setting.    Data Reviewed: I have personally reviewed following labs and imaging studies  CBC: Recent Labs  Lab 11/06/22 0153 11/06/22 0834 11/07/22 0136 11/07/22 0733 11/08/22 0109 11/09/22 0120 11/10/22 0138 11/11/22 0136 11/12/22 0153  WBC 11.4*  --  13.2*  --  14.4* 19.7* 14.6* 16.4* 12.7*  NEUTROABS 8.5*  --  9.2*  --  11.8*  --   --   --   --   HGB 7.1*   < > 9.7*   < > 9.1* 9.6* 7.5* 8.5* 8.6*  HCT 21.2*   < > 28.2*   < > 26.9* 29.5* 22.8* 24.9* 25.7*  MCV 85.1  --  84.4  --  83.3 88.1 85.4 85.6 85.7  PLT 225  --  272  --  298 336 298 365 374   < > = values in this interval not displayed.   Basic Metabolic Panel: Recent Labs  Lab 11/06/22 0153 11/07/22 0136 11/08/22 0109 11/09/22 0120 11/10/22 0138 11/12/22 0153  NA 132* 132* 130* 129* 131* 130*  K 4.2 4.4 4.4 5.8* 4.5 4.5  CL 99 95* 95* 93* 95* 96*  CO2 23 26 24 25 23 23   GLUCOSE 294* 177* 288* 269* 218* 241*  BUN 40* 27* 24* 32* 33* 26*  CREATININE 1.29* 1.04 1.22 1.53* 1.23 1.02  CALCIUM 8.2* 9.0 9.1 9.3 8.8* 9.4  MG 1.7 1.9 1.7  --   --   --   PHOS 3.5 4.0 3.7  --   --   --    GFR: Estimated Creatinine Clearance: 79.2 mL/min (by C-G formula based on SCr of 1.02 mg/dL). Liver Function Tests: Recent Labs  Lab 11/06/22 0153 11/07/22 0136 11/08/22 0109 11/12/22 0153  AST 30 36 19 18  ALT 27 41 30 18  ALKPHOS 59 72 78 105  BILITOT 0.4 0.6 0.5 0.4  PROT 5.2* 6.3* 6.4* 6.9  ALBUMIN 2.0* 2.5* 2.5* 2.6*   No results for input(s): "LIPASE", "AMYLASE" in the last 168 hours. No  results for input(s): "AMMONIA" in the last 168 hours. Coagulation Profile: No results for input(s): "INR", "PROTIME" in the last 168 hours.  Cardiac Enzymes: No results for input(s): "CKTOTAL", "CKMB", "CKMBINDEX", "TROPONINI" in the last 168  hours. BNP (last 3 results) No results for input(s): "PROBNP" in the last 8760 hours. HbA1C: No results for input(s): "HGBA1C" in the last 72 hours. CBG: Recent Labs  Lab 11/11/22 1147 11/11/22 1601 11/11/22 2016 11/12/22 0626 11/12/22 1233  GLUCAP 262* 309* 287* 182* 255*   Lipid Profile: No results for input(s): "CHOL", "HDL", "LDLCALC", "TRIG", "CHOLHDL", "LDLDIRECT" in the last 72 hours. Thyroid Function Tests: No results for input(s): "TSH", "T4TOTAL", "FREET4", "T3FREE", "THYROIDAB" in the last 72 hours. Anemia Panel: No results for input(s): "VITAMINB12", "FOLATE", "FERRITIN", "TIBC", "IRON", "RETICCTPCT" in the last 72 hours. Sepsis Labs: No results for input(s): "PROCALCITON", "LATICACIDVEN" in the last 168 hours.   Recent Results (from the past 240 hour(s))  Surgical pcr screen     Status: None   Collection Time: 11/03/22  7:07 AM   Specimen: Nasal Mucosa; Nasal Swab  Result Value Ref Range Status   MRSA, PCR NEGATIVE NEGATIVE Final   Staphylococcus aureus NEGATIVE NEGATIVE Final    Comment: (NOTE) The Xpert SA Assay (FDA approved for NASAL specimens in patients 65 years of age and older), is one component of a comprehensive surveillance program. It is not intended to diagnose infection nor to guide or monitor treatment. Performed at [facility], [address] [place], [place] [address]   Culture, blood (x 2)     Status: None   Collection Time: 11/04/22  2:50 PM   Specimen: BLOOD  Result Value Ref Range Status   Specimen Description BLOOD SITE NOT SPECIFIED  Final   Special Requests   Final    BOTTLES DRAWN AEROBIC AND ANAEROBIC Blood Culture results may not be optimal due to an inadequate volume of blood received in culture bottles   Culture   Final    NO GROWTH 5 DAYS Performed at [facility], [address] [place], [place] [address]    Report Status 11/09/2022 FINAL  Final  Culture, blood (x 2)     Status: None   Collection Time:  11/04/22  2:54 PM   Specimen: BLOOD  Result Value Ref Range Status   Specimen Description BLOOD BLOOD RIGHT HAND  Final   Special Requests   Final    BOTTLES DRAWN AEROBIC AND ANAEROBIC Blood Culture results may not be optimal due to an inadequate volume of blood received in culture bottles   Culture   Final    NO GROWTH 5 DAYS Performed at [facility], [address], [place], [place] [address]    Report Status 11/09/2022 FINAL  Final  Aerobic/Anaerobic Culture w Gram Stain (surgical/deep wound)     Status: None (Preliminary result)   Collection Time: 11/08/22  6:22 PM   Specimen: Foot, Left; Tissue  Result Value Ref Range Status   Specimen Description ABSCESS LEFT FOOT  Final   Special Requests SWABS SAMPLE A  Final   Gram Stain   Final    ABUNDANT WBC PRESENT, PREDOMINANTLY PMN RARE GRAM POSITIVE COCCI Performed at [facility], [address], [place], [place] [address]    Culture   Final    MODERATE METHICILLIN RESISTANT STAPHYLOCOCCUS AUREUS NO ANAEROBES ISOLATED; CULTURE IN PROGRESS FOR 5 DAYS    Report Status PENDING  Incomplete   Organism ID,  Bacteria METHICILLIN RESISTANT STAPHYLOCOCCUS AUREUS  Final      Susceptibility   Methicillin resistant staphylococcus aureus - MIC*    CIPROFLOXACIN >=8 RESISTANT Resistant     ERYTHROMYCIN >=8 RESISTANT Resistant     GENTAMICIN <=0.5 SENSITIVE Sensitive     OXACILLIN >=4 RESISTANT Resistant     TETRACYCLINE <=1 SENSITIVE Sensitive     VANCOMYCIN 1 SENSITIVE Sensitive     TRIMETH/SULFA >=320 RESISTANT Resistant     CLINDAMYCIN >=8 RESISTANT Resistant     RIFAMPIN <=0.5 SENSITIVE Sensitive     Inducible Clindamycin NEGATIVE Sensitive     LINEZOLID 2 SENSITIVE Sensitive     * MODERATE METHICILLIN RESISTANT STAPHYLOCOCCUS AUREUS  Aerobic/Anaerobic Culture w Gram Stain (surgical/deep wound)     Status: None (Preliminary result)   Collection Time: 11/08/22  6:23 PM   Specimen: Foot, Left; Tissue  Result Value Ref  Range Status   Specimen Description BONE  Final   Special Requests 5TH  METATARSAL  Final   Gram Stain   Final    NO WBC SEEN NO ORGANISMS SEEN Performed at [facility], [address], [place], [place] [address]    Culture   Final    RARE METHICILLIN RESISTANT STAPHYLOCOCCUS AUREUS RARE ENTEROCOCCUS FAECALIS CRITICAL RESULT CALLED TO, READ BACK BY AND VERIFIED WITH: Alene Mires [id number] AT 1232 BY EC NO ANAEROBES ISOLATED; CULTURE IN PROGRESS FOR 5 DAYS    Report Status PENDING  Incomplete   Organism ID, Bacteria METHICILLIN RESISTANT STAPHYLOCOCCUS AUREUS  Final   Organism ID, Bacteria ENTEROCOCCUS FAECALIS  Final      Susceptibility   Enterococcus faecalis - MIC*    AMPICILLIN <=2 SENSITIVE Sensitive     VANCOMYCIN 2 SENSITIVE Sensitive     GENTAMICIN SYNERGY SENSITIVE Sensitive     * RARE ENTEROCOCCUS FAECALIS   Methicillin resistant staphylococcus aureus - MIC*    CIPROFLOXACIN >=8 RESISTANT Resistant     ERYTHROMYCIN >=8 RESISTANT Resistant     GENTAMICIN <=0.5 SENSITIVE Sensitive     OXACILLIN >=4 RESISTANT Resistant     TETRACYCLINE <=1 SENSITIVE Sensitive     VANCOMYCIN 1 SENSITIVE Sensitive     TRIMETH/SULFA >=320 RESISTANT Resistant     CLINDAMYCIN >=8 RESISTANT Resistant     RIFAMPIN <=0.5 SENSITIVE Sensitive     Inducible Clindamycin NEGATIVE Sensitive     LINEZOLID 2 SENSITIVE Sensitive     * RARE METHICILLIN RESISTANT STAPHYLOCOCCUS AUREUS      Radiology Studies: No results found.   Scheduled Meds:  amoxicillin-clavulanate  1 tablet Oral Q12H   aspirin EC  81 mg Oral Daily   clopidogrel  75 mg Oral Daily   doxycycline  100 mg Oral Q12H   ezetimibe  10 mg Oral Daily   gemfibrozil  600 mg Oral BID AC   insulin aspart  0-5 Units Subcutaneous QHS   insulin aspart  0-9 Units Subcutaneous TID WC   insulin glargine-yfgn  15 Units Subcutaneous Daily   melatonin  10 mg Oral QHS   nicotine  14 mg Transdermal Daily   pantoprazole  40 mg Oral BID    sodium chloride flush  3 mL Intravenous Q12H   Continuous Infusions:  sodium chloride       LOS: 13 days   35 min  Darlin Drop, MD  11/12/2022, 1:08 PM

## 2022-11-12 NOTE — Progress Notes (Signed)
Mobility Specialist: Progress Note   11/12/22 1229  Mobility  Activity Ambulated with assistance in hallway  Level of Assistance Standby assist, set-up cues, supervision of patient - no hands on  Assistive Device Front wheel walker  Distance Ambulated (ft) 90 ft  LLE Weight Bearing PWB  LLE Partial Weight Bearing Percentage or Pounds through heel  Activity Response Tolerated well  Mobility Referral Yes  $Mobility charge 1 Mobility  Mobility Specialist Start Time (ACUTE ONLY) 1215  Mobility Specialist Stop Time (ACUTE ONLY) 1229  Mobility Specialist Time Calculation (min) (ACUTE ONLY) 14 min   Post-Mobility: 89 HR, 99% SpO2  Pt received in the bed and agreeable to mobility. Mod I with bed mobility and standby during ambulation. C/o 8/10 Lt foot pain, otherwise asymptomatic. Pt back to bed at end of session with call bell and phone in reach.   Rayford Williamsen Mobility Specialist Please contact via SecureChat or Rehab office at [phone]

## 2022-11-13 ENCOUNTER — Inpatient Hospital Stay (HOSPITAL_COMMUNITY): Payer: BC Managed Care – PPO

## 2022-11-13 DIAGNOSIS — M86172 Other acute osteomyelitis, left ankle and foot: Secondary | ICD-10-CM | POA: Diagnosis not present

## 2022-11-13 LAB — AEROBIC/ANAEROBIC CULTURE W GRAM STAIN (SURGICAL/DEEP WOUND)

## 2022-11-13 LAB — BASIC METABOLIC PANEL
Anion gap: 11 (ref 5–15)
BUN: 34 mg/dL — ABNORMAL HIGH (ref 6–20)
CO2: 24 mmol/L (ref 22–32)
Calcium: 9.6 mg/dL (ref 8.9–10.3)
Chloride: 98 mmol/L (ref 98–111)
Creatinine, Ser: 1.31 mg/dL — ABNORMAL HIGH (ref 0.61–1.24)
GFR, Estimated: >60 mL/min (ref >60)
Glucose, Bld: 213 mg/dL — ABNORMAL HIGH (ref 70–99)
Potassium: 4.5 mmol/L (ref 3.5–5.1)
Sodium: 133 mmol/L — ABNORMAL LOW (ref 135–145)

## 2022-11-13 LAB — CBC
HCT: 26.8 % — ABNORMAL LOW (ref 39.0–52.0)
Hemoglobin: 9 g/dL — ABNORMAL LOW (ref 13.0–17.0)
MCH: 29.4 pg (ref 26.0–34.0)
MCHC: 33.6 g/dL (ref 30.0–36.0)
MCV: 87.6 fL (ref 80.0–100.0)
Platelets: 443 10*3/uL — ABNORMAL HIGH (ref 150–400)
RBC: 3.06 MIL/uL — ABNORMAL LOW (ref 4.22–5.81)
RDW: 14.3 % (ref 11.5–15.5)
WBC: 11.7 10*3/uL — ABNORMAL HIGH (ref 4.0–10.5)
nRBC: 0 % (ref 0.0–0.2)

## 2022-11-13 LAB — GLUCOSE, CAPILLARY
Glucose-Capillary: 212 mg/dL — ABNORMAL HIGH (ref 70–99)
Glucose-Capillary: 260 mg/dL — ABNORMAL HIGH (ref 70–99)
Glucose-Capillary: 250 mg/dL — ABNORMAL HIGH (ref 70–99)
Glucose-Capillary: 194 mg/dL — ABNORMAL HIGH (ref 70–99)

## 2022-11-13 LAB — PHOSPHORUS: Phosphorus: 3.8 mg/dL (ref 2.5–4.6)

## 2022-11-13 LAB — SURGICAL PATHOLOGY

## 2022-11-13 LAB — MAGNESIUM: Magnesium: 2 mg/dL (ref 1.7–2.4)

## 2022-11-13 MED ORDER — INSULIN GLARGINE-YFGN 100 UNIT/ML ~~LOC~~ SOLN
15.0000 [IU] | Freq: Two times a day (BID) | SUBCUTANEOUS | Status: DC
  Administered 2022-11-13 – 2022-11-15 (×4): 15 [IU] via SUBCUTANEOUS
  Filled 2022-11-13 (×6): qty 0.15

## 2022-11-13 MED ORDER — GADOBUTROL 1 MMOL/ML IV SOLN
7.0000 mL | Freq: Once | INTRAVENOUS | Status: AC | PRN
  Administered 2022-11-13: 7 mL via INTRAVENOUS

## 2022-11-13 MED ORDER — INSULIN ASPART 100 UNIT/ML IJ SOLN
0.0000 [IU] | Freq: Every day | INTRAMUSCULAR | Status: DC
  Administered 2022-11-14: 4 [IU] via SUBCUTANEOUS
  Administered 2022-11-16: 2 [IU] via SUBCUTANEOUS

## 2022-11-13 MED ORDER — SODIUM CHLORIDE 0.9 % IV SOLN: INTRAVENOUS | Status: DC

## 2022-11-13 MED ORDER — INSULIN ASPART 100 UNIT/ML IJ SOLN
0.0000 [IU] | Freq: Three times a day (TID) | INTRAMUSCULAR | Status: DC
  Administered 2022-11-13: 8 [IU] via SUBCUTANEOUS
  Administered 2022-11-13 – 2022-11-14 (×2): 5 [IU] via SUBCUTANEOUS
  Administered 2022-11-14: 3 [IU] via SUBCUTANEOUS
  Administered 2022-11-15: 5 [IU] via SUBCUTANEOUS
  Administered 2022-11-16: 3 [IU] via SUBCUTANEOUS
  Administered 2022-11-16: 5 [IU] via SUBCUTANEOUS
  Administered 2022-11-16: 8 [IU] via SUBCUTANEOUS
  Administered 2022-11-17: 3 [IU] via SUBCUTANEOUS

## 2022-11-13 MED ORDER — BISACODYL 10 MG RE SUPP
10.0000 mg | Freq: Once | RECTAL | Status: AC
  Administered 2022-11-13: 10 mg via RECTAL
  Filled 2022-11-13: qty 1

## 2022-11-13 NOTE — Progress Notes (Signed)
   Subjective: 5 Days Post-Op Procedure(s) (LRB): IRRIGATION AND DEBRIDEMENT OF FOOT AND WASHOUT (Left) DOS: 11/08/2022  Objective: Vital signs in last 24 hours: Temp:  [98.1 F (36.7 C)-98.6 F (37 C)] 98.6 F (37 C) (06/24 1951) Pulse Rate:  [66-76] 66 (06/24 1951) Resp:  [18] 18 (06/24 1951) BP: (125-148)/(66-75) 125/66 (06/24 1951) SpO2:  [90 %-100 %] 100 % (06/24 1951) Weight:  [72.5 kg] 72.5 kg (06/24 0420)  Recent Labs    11/11/22 0136 11/12/22 0153 11/13/22 0704  HGB 8.5* 8.6* 9.0*   Recent Labs    11/12/22 0153 11/13/22 0704  WBC 12.7* 11.7*  RBC 3.00* 3.06*  HCT 25.7* 26.8*  PLT 374 443*   Recent Labs    11/12/22 0153 11/13/22 0704  NA 130* 133*  K 4.5 4.5  CL 96* 98  CO2 23 24  BUN 26* 34*  CREATININE 1.02 1.31*  GLUCOSE 241* 213*  CALCIUM 9.4 9.6   No results for input(s): "LABPT", "INR" in the last 72 hours.  Physical Exam: Sutures intact.  Central portion of the amputation site is open.  Scant seropurulent drainage.  No malodor.  Overall amputation site appears somewhat stable.  MR FOOT LEFT W WO CONTRAST 11/13/2022 1010AM IMPRESSION: 1. 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 Acute osteomyelitis within the residual fifth metatarsal, as described. 2. Bone marrow edema is present throughout the proximal phalanx of the fifth toe is favored to represent early acute osteomyelitis. 3. Soft tissue wound or ulceration at the lateral forefoot at the surgical site. Partially rim enhancing fluid and air collection within the lateral forefoot measuring approximately 6.2 x 1.2 x 3.0 cm. Findings are most compatible with phlegmon/developing abscess.  Assessment/Plan: 5 Days Post-Op Procedure(s) (LRB): IRRIGATION AND DEBRIDEMENT OF FOOT AND WASHOUT (Left) DOS: 11/08/2022 Abdominal aortogram w/ LE balloon angioplasty.  Dr. Christopher Clark VVS.  11/03/2022  -Patient seen at bedside.  Continues to have pain associated  to the foot -Repeat MRI ordered today by internal medicine.  Findings consistent for residual osteomyelitis -ID recommends 6 weeks of Augmentin and doxycycline. -PT recommending SNF on discharge. -Did not realize when seeing patient that repeat MRI had been ordered. Will need to discuss with patient possibility of return to OR for more aggressive 5th ray amptutation. If patient is amenable to this plan, will tentatively scheduled for Wednesday, 09/15/2022. -Will follow    Sakari Alkhatib M Karrie Fluellen 11/13/2022, 11:20 PM  Lenae Wherley M. Keante Urizar, DPM [facility]  Dr. Jaselle Pryer M. Jessieca Rhem, DPM    [address]                                    [place], [place] [address]                Office [phone]  Fax [phone]    

## 2022-11-13 NOTE — Plan of Care (Signed)

## 2022-11-13 NOTE — Progress Notes (Signed)
PROGRESS NOTE    Kenneth Bailey  NUU:[id number] DOB: June 09, 1965 DOA: 10/30/2022 PCP: [name], MD  Brief Narrative:  57 year old male with history of uncontrolled diabetes type 2, coronary artery disease status post CABG, chronic combined systolic/diastolic CHF, tobacco abuse who presented with complaint of persistent left foot pain.  Workup revealed early osteomyelitis of the fifth metatarsal head as well as a phlegmon and early abscess.  Seen by podiatry, status post left foot incision and drainage with resection of fifth metatarsal head on 10/31/22.  Wound cultures grew MRSA.  ID, vascular surgery consulted due to concern for peripheral artery disease.  Hospital course complicated by hematemesis, GI consulted, underwent EGD and found to have reflux esophagitis, nonbleeding gastric ulcer.  Repeat I&D of left foot and washout on 11/08/22.  PT/OT recommending SNF on discharge.  11/13/2022: The patient was seen and examined at bedside.  Reports pain in his left foot 7 out of 10.  States no improvement of the pain, states the pain is about the same.  MRI of the left foot ordered to further assess.   Assessment & Plan:   Principal Problem:   Osteomyelitis (HCC) Active Problems:   Abscess of left foot   Gastric ulcer   Duodenal ulcer   [place] grade D esophagitis   Sepsis (HCC)   Left fifth toe/lateral foot cellulitis with osteomyelitis, POA Podiatry following.  Status post left foot I&D and resection of fifth metatarsal on 10/31/22.  Wound culture showed MRSA.  ID also consulted, he got few days of IV vancomycin, cefepime.  Given oritavancin, plan was for starting doxycycline 11/11/2022 for 14 days. Podiatry repeated I&D of foot and washout on 11/08/22.  Found to have a lot of necrotic tissue.  Wound cultures repeated, prelim is showing staph.  ID reconsulted because of worsening leukocytosis, concern for nonhealing wound.  Dr. Ashley Royalty discussed the case with ID Dr. Danelle Earthly, she  recommended 6 weeks of Augmentin and doxycycline. Complains of persistent left hip pain.  MRI ordered in 11/13/2022 to further assess. Continue pain control and bowel regimen   Peripheral artery disease:  Seen by vascular surgery, status post abdominal aortogram, left posterior tibial artery and tibioperoneal trunk angioplasty.  Currently on aspirin, Plavix, Zetia and Lopid.   GI bleed: Hospital course was remarkable for nausea, vomiting, hematemesis, black stools, GI consulted, underwent EGD and found to have reflux esophagitis, nonbleeding gastric ulcer.  Currently on Protonix 40 mg twice daily.  Hemoglobin dropped to the range of 7.5 on 11/10/2022 but no new episode of hematochezia or melena.   Hemoglobin 9.0 from 8.6 from 8.5 on 11/11/2022. No overt bleeding reported today. Continue twice daily p.o. Protonix   Chronic combined systolic/diastolic CHF:  Last 2D echo on 09/12/2022 with LVEF 45 to 50% with grade 1 diastolic dysfunction. Currently euvolemic Strict I's and O's and daily weight, currently not recorded   Tobacco abuse:  Continue nicotine patch   Hyperlipidemia:  On gemfibrozil, ezitimibe   Coronary artery disease status post CABG:  Denies any anginal symptoms. Currently on aspirin, Plavix, ezetimibe and Lopid   Uncontrolled diabetes type 2:  Recent A1c of 10.7.  Monitor blood sugars.  Continue current insulin regimen.  Diabetic coordinator following   Resolved hyperkalemia/AKI Stable now after iv fluids were given  Mild hyponatremia Serum sodium 130, serum glucose 241 Corrected sodium for hyperglycemia 132. Continue to monitor.   Debility/deconditioning: Patient lives with his mother.  PT is recommending SNF on discharge.  TOC following  Chronic constipation No bowel movement in 8 days Dulcolax suppository Senokot twice daily  Time: 50 minutes.  Estimated body mass index is 24.3 kg/m as calculated from the following:   Height as of this encounter: 5\' 8"   (1.727 m).   Weight as of this encounter: 72.5 kg.  DVT prophylaxis: scds Code Status:full Family Communication: None at bedside. Disposition Plan:  Status is: Inpatient Remains inpatient appropriate because: snf   Consultants: id gi vascular podiatry  Procedures: egd angioplasty I and d Antimicrobials:  Anti-infectives (From admission, onward)    Start     Dose/Rate Route Frequency Ordered Stop   11/11/22 1000  doxycycline (VIBRA-TABS) tablet 100 mg  Status:  Discontinued        100 mg Oral Every 12 hours 11/03/22 1616 11/10/22 0800   11/11/22 0000  doxycycline (VIBRAMYCIN) 100 MG capsule  Status:  Discontinued        100 mg Oral 2 times daily 11/07/22 1624 11/09/22    11/10/22 0800  doxycycline (VIBRA-TABS) tablet 100 mg        100 mg Oral Every 12 hours 11/10/22 0800 12/22/22 0959   11/09/22 1045  amoxicillin-clavulanate (AUGMENTIN) 875-125 MG per tablet 1 tablet        1 tablet Oral Every 12 hours 11/09/22 0957 12/21/22 0959   11/09/22 0000  doxycycline (VIBRAMYCIN) 100 MG capsule        100 mg Oral 2 times daily 11/09/22 1424 12/21/22 2359   11/09/22 0000  amoxicillin-clavulanate (AUGMENTIN) 875-125 MG tablet        1 tablet Oral 2 times daily 11/09/22 1424 12/21/22 2359   11/04/22 0800  Oritavancin Diphosphate (ORBACTIV) 1,200 mg in dextrose 5 % IVPB        1,200 mg 333.3 mL/hr over 180 Minutes Intravenous Once 11/03/22 1611 11/04/22 1158   11/03/22 0500  vancomycin (VANCOREADY) IVPB 1500 mg/300 mL  Status:  Discontinued        1,500 mg 150 mL/hr over 120 Minutes Intravenous Every 24 hours 11/02/22 1439 11/03/22 1611   11/02/22 1445  ceFEPIme (MAXIPIME) 2 g in sodium chloride 0.9 % 100 mL IVPB  Status:  Discontinued        2 g 200 mL/hr over 30 Minutes Intravenous Every 8 hours 11/02/22 1437 11/03/22 1430   11/02/22 0500  vancomycin (VANCOREADY) IVPB 1250 mg/250 mL  Status:  Discontinued        1,250 mg 166.7 mL/hr over 90 Minutes Intravenous Every 24 hours 11/01/22  0721 11/02/22 1439   11/01/22 0430  vancomycin (VANCOCIN) IVPB 1000 mg/200 mL premix        1,000 mg 200 mL/hr over 60 Minutes Intravenous  Once 11/01/22 0339 11/01/22 0620   11/01/22 0430  ceFEPIme (MAXIPIME) 2 g in sodium chloride 0.9 % 100 mL IVPB  Status:  Discontinued        2 g 200 mL/hr over 30 Minutes Intravenous Every 12 hours 11/01/22 0342 11/02/22 1437   11/01/22 0400  ceFEPIme (MAXIPIME) 2 g in sodium chloride 0.9 % 100 mL IVPB  Status:  Discontinued        2 g 200 mL/hr over 30 Minutes Intravenous Every 24 hours 10/31/22 0342 11/01/22 0341   10/31/22 0430  ceFEPIme (MAXIPIME) 2 g in sodium chloride 0.9 % 100 mL IVPB        2 g 200 mL/hr over 30 Minutes Intravenous  Once 10/31/22 0342 10/31/22 0434   10/30/22 2138  vancomycin variable dose per unstable renal  function (pharmacist dosing)  Status:  Discontinued         Does not apply See admin instructions 10/30/22 2138 11/01/22 0721   10/30/22 1745  vancomycin (VANCOREADY) IVPB 1500 mg/300 mL        1,500 mg 150 mL/hr over 120 Minutes Intravenous  Once 10/30/22 1743 10/30/22 2058         Objective: Vitals:   11/12/22 2356 11/13/22 0420 11/13/22 0421 11/13/22 0744  BP: 127/73 (!) 148/75  138/69  Pulse:    76  Resp:  18  18  Temp:  98.5 F (36.9 C)  98.1 F (36.7 C)  TempSrc:  Oral  Oral  SpO2:   100% 90%  Weight:  72.5 kg    Height:        Intake/Output Summary (Last 24 hours) at 11/13/2022 1259 Last data filed at 11/13/2022 0800 Gross per 24 hour  Intake 720 ml  Output --  Net 720 ml    Filed Weights   11/11/22 0500 11/12/22 0440 11/13/22 0420  Weight: 72.4 kg 72.8 kg 72.5 kg    Examination:  General exam: Well-developed well-nourished in no acute distress.  He is alert oriented x 3. Respiratory system: Clear to auscultation with no wheezes or rales. Cardiovascular system: Regular rate and rhythm no rubs or gallops. Gastrointestinal system: Soft nontender with normal bowel sounds present.   Central  nervous system: Alert oriented. Extremities: Left foot covered with dressing. Psychiatry: Mood is appropriate for condition and setting.    Data Reviewed: I have personally reviewed following labs and imaging studies  CBC: Recent Labs  Lab 11/07/22 0136 11/07/22 0733 11/08/22 0109 11/09/22 0120 11/10/22 0138 11/11/22 0136 11/12/22 0153 11/13/22 0704  WBC 13.2*  --  14.4* 19.7* 14.6* 16.4* 12.7* 11.7*  NEUTROABS 9.2*  --  11.8*  --   --   --   --   --   HGB 9.7*   < > 9.1* 9.6* 7.5* 8.5* 8.6* 9.0*  HCT 28.2*   < > 26.9* 29.5* 22.8* 24.9* 25.7* 26.8*  MCV 84.4  --  83.3 88.1 85.4 85.6 85.7 87.6  PLT 272  --  298 336 298 365 374 443*   < > = values in this interval not displayed.   Basic Metabolic Panel: Recent Labs  Lab 11/07/22 0136 11/08/22 0109 11/09/22 0120 11/10/22 0138 11/12/22 0153 11/13/22 0704  NA 132* 130* 129* 131* 130* 133*  K 4.4 4.4 5.8* 4.5 4.5 4.5  CL 95* 95* 93* 95* 96* 98  CO2 26 24 25 23 23 24   GLUCOSE 177* 288* 269* 218* 241* 213*  BUN 27* 24* 32* 33* 26* 34*  CREATININE 1.04 1.22 1.53* 1.23 1.02 1.31*  CALCIUM 9.0 9.1 9.3 8.8* 9.4 9.6  MG 1.9 1.7  --   --   --  2.0  PHOS 4.0 3.7  --   --   --  3.8   GFR: Estimated Creatinine Clearance: 61.6 mL/min (A) (by C-G formula based on SCr of 1.31 mg/dL (H)). Liver Function Tests: Recent Labs  Lab 11/07/22 0136 11/08/22 0109 11/12/22 0153  AST 36 19 18  ALT 41 30 18  ALKPHOS 72 78 105  BILITOT 0.6 0.5 0.4  PROT 6.3* 6.4* 6.9  ALBUMIN 2.5* 2.5* 2.6*   No results for input(s): "LIPASE", "AMYLASE" in the last 168 hours. No results for input(s): "AMMONIA" in the last 168 hours. Coagulation Profile: No results for input(s): "INR", "PROTIME" in the last 168 hours.  Cardiac Enzymes: No results for input(s): "CKTOTAL", "CKMB", "CKMBINDEX", "TROPONINI" in the last 168 hours. BNP (last 3 results) No results for input(s): "PROBNP" in the last 8760 hours. HbA1C: No results for input(s): "HGBA1C" in  the last 72 hours. CBG: Recent Labs  Lab 11/12/22 1233 11/12/22 1548 11/12/22 2136 11/13/22 0606 11/13/22 1144  GLUCAP 255* 239* 196* 212* 260*   Lipid Profile: No results for input(s): "CHOL", "HDL", "LDLCALC", "TRIG", "CHOLHDL", "LDLDIRECT" in the last 72 hours. Thyroid Function Tests: No results for input(s): "TSH", "T4TOTAL", "FREET4", "T3FREE", "THYROIDAB" in the last 72 hours. Anemia Panel: No results for input(s): "VITAMINB12", "FOLATE", "FERRITIN", "TIBC", "IRON", "RETICCTPCT" in the last 72 hours. Sepsis Labs: No results for input(s): "PROCALCITON", "LATICACIDVEN" in the last 168 hours.   Recent Results (from the past 240 hour(s))  Culture, blood (x 2)     Status: None   Collection Time: 11/04/22  2:50 PM   Specimen: BLOOD  Result Value Ref Range Status   Specimen Description BLOOD SITE NOT SPECIFIED  Final   Special Requests   Final    BOTTLES DRAWN AEROBIC AND ANAEROBIC Blood Culture results may not be optimal due to an inadequate volume of blood received in culture bottles   Culture   Final    NO GROWTH 5 DAYS Performed at [facility], [address], [place], [place] [address]    Report Status 11/09/2022 FINAL  Final  Culture, blood (x 2)     Status: None   Collection Time: 11/04/22  2:54 PM   Specimen: BLOOD  Result Value Ref Range Status   Specimen Description BLOOD BLOOD RIGHT HAND  Final   Special Requests   Final    BOTTLES DRAWN AEROBIC AND ANAEROBIC Blood Culture results may not be optimal due to an inadequate volume of blood received in culture bottles   Culture   Final    NO GROWTH 5 DAYS Performed at [facility], [address], [place], [place] [address]    Report Status 11/09/2022 FINAL  Final  Aerobic/Anaerobic Culture w Gram Stain (surgical/deep wound)     Status: None   Collection Time: 11/08/22  6:22 PM   Specimen: Foot, Left; Tissue  Result Value Ref Range Status   Specimen Description ABSCESS LEFT FOOT  Final   Special  Requests SWABS SAMPLE A  Final   Gram Stain   Final    ABUNDANT WBC PRESENT, PREDOMINANTLY PMN RARE GRAM POSITIVE COCCI    Culture   Final    MODERATE METHICILLIN RESISTANT STAPHYLOCOCCUS AUREUS NO ANAEROBES ISOLATED Performed at [facility], [address], [place], [place] [address]    Report Status 11/13/2022 FINAL  Final   Organism ID, Bacteria METHICILLIN RESISTANT STAPHYLOCOCCUS AUREUS  Final      Susceptibility   Methicillin resistant staphylococcus aureus - MIC*    CIPROFLOXACIN >=8 RESISTANT Resistant     ERYTHROMYCIN >=8 RESISTANT Resistant     GENTAMICIN <=0.5 SENSITIVE Sensitive     OXACILLIN >=4 RESISTANT Resistant     TETRACYCLINE <=1 SENSITIVE Sensitive     VANCOMYCIN 1 SENSITIVE Sensitive     TRIMETH/SULFA >=320 RESISTANT Resistant     CLINDAMYCIN >=8 RESISTANT Resistant     RIFAMPIN <=0.5 SENSITIVE Sensitive     Inducible Clindamycin NEGATIVE Sensitive     LINEZOLID 2 SENSITIVE Sensitive     * MODERATE METHICILLIN RESISTANT STAPHYLOCOCCUS AUREUS  Aerobic/Anaerobic Culture w Gram Stain (surgical/deep wound)     Status: None  Collection Time: 11/08/22  6:23 PM   Specimen: Foot, Left; Tissue  Result Value Ref Range Status   Specimen Description BONE  Final   Special Requests 5TH  METATARSAL  Final   Gram Stain NO WBC SEEN NO ORGANISMS SEEN   Final   Culture   Final    RARE METHICILLIN RESISTANT STAPHYLOCOCCUS AUREUS RARE ENTEROCOCCUS FAECALIS CRITICAL RESULT CALLED TO, READ BACK BY AND VERIFIED WITH: Alene Mires [id number] AT 1232 BY EC NO ANAEROBES ISOLATED Performed at [facility], [address], [place], [place] [address]    Report Status 11/13/2022 FINAL  Final   Organism ID, Bacteria METHICILLIN RESISTANT STAPHYLOCOCCUS AUREUS  Final   Organism ID, Bacteria ENTEROCOCCUS FAECALIS  Final      Susceptibility   Enterococcus faecalis - MIC*    AMPICILLIN <=2 SENSITIVE Sensitive     VANCOMYCIN 2 SENSITIVE Sensitive     GENTAMICIN SYNERGY  SENSITIVE Sensitive     * RARE ENTEROCOCCUS FAECALIS   Methicillin resistant staphylococcus aureus - MIC*    CIPROFLOXACIN >=8 RESISTANT Resistant     ERYTHROMYCIN >=8 RESISTANT Resistant     GENTAMICIN <=0.5 SENSITIVE Sensitive     OXACILLIN >=4 RESISTANT Resistant     TETRACYCLINE <=1 SENSITIVE Sensitive     VANCOMYCIN 1 SENSITIVE Sensitive     TRIMETH/SULFA >=320 RESISTANT Resistant     CLINDAMYCIN >=8 RESISTANT Resistant     RIFAMPIN <=0.5 SENSITIVE Sensitive     Inducible Clindamycin NEGATIVE Sensitive     LINEZOLID 2 SENSITIVE Sensitive     * RARE METHICILLIN RESISTANT STAPHYLOCOCCUS AUREUS      Radiology Studies: MR FOOT LEFT W WO CONTRAST  Result Date: 11/13/2022 CLINICAL DATA:  Foot swelling, diabetic, osteomyelitis suspected, xray done EXAM: MRI OF THE LEFT FOREFOOT WITHOUT AND WITH CONTRAST TECHNIQUE: Multiplanar, multisequence MR imaging of the left forefoot was performed both before and after administration of intravenous contrast. CONTRAST:  7mL GADAVIST GADOBUTROL 1 MMOL/ML IV SOLN COMPARISON:  X-ray 11/08/2022.  MRI 10/30/2022 FINDINGS: Bones/Joint/Cartilage Postsurgical changes following resection of the distal aspect of the fifth metatarsal to the level of the proximal diaphysis. There is bone marrow edema and enhancement with intermediate to low T1 signal within the residual fifth metatarsal extending to the level of the proximal metaphysis. Mild bone marrow edema extends to the base of the fifth metatarsal (series 6, images 8-9). Bone marrow edema is present throughout the proximal phalanx of the fifth toe without associated low T1 bone marrow signal. Remaining bony structures of the forefoot demonstrate preserved bone marrow signal without fracture, dislocation, or marrow replacement. Similar mild degenerative changes. Ligaments Intact Lisfranc ligament.  No collateral ligament injury. Muscles and Tendons Diffuse edema-like signal of the foot musculature which may represent  changes related to denervation and/or myositis. No tenosynovitis. Soft tissues Soft tissue wound or ulceration at the lateral forefoot at the surgical site. Partially rim enhancing fluid and air collection within the lateral forefoot measuring approximately 6.2 x 1.2 x 3.0 cm. IMPRESSION: 1. 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 Acute osteomyelitis within the residual fifth metatarsal, as described. 2. Bone marrow edema is present throughout the proximal phalanx of the fifth toe is favored to represent early acute osteomyelitis. 3. Soft tissue wound or ulceration at the lateral forefoot at the surgical site. Partially rim enhancing fluid and air collection within the lateral forefoot measuring approximately 6.2 x 1.2 x 3.0 cm. Findings are most compatible with phlegmon/developing abscess. Electronically Signed  By: Duanne Guess D.O.   On: 11/13/2022 11:11     Scheduled Meds:  amoxicillin-clavulanate  1 tablet Oral Q12H   aspirin EC  81 mg Oral Daily   clopidogrel  75 mg Oral Daily   doxycycline  100 mg Oral Q12H   ezetimibe  10 mg Oral Daily   gemfibrozil  600 mg Oral BID AC   insulin aspart  0-15 Units Subcutaneous TID WC   insulin aspart  0-5 Units Subcutaneous QHS   insulin glargine-yfgn  15 Units Subcutaneous BID   melatonin  10 mg Oral QHS   nicotine  14 mg Transdermal Daily   pantoprazole  40 mg Oral BID   senna-docusate  2 tablet Oral BID   sodium chloride flush  3 mL Intravenous Q12H   Continuous Infusions:  sodium chloride     sodium chloride 50 mL/hr at 11/13/22 1059     LOS: 14 days   35 min  Darlin Drop, MD  11/13/2022, 12:59 PM

## 2022-11-13 NOTE — TOC Progression Note (Signed)
Transition of Care [facility]) - Progression Note    Patient Details  Name: Kenneth Bailey MRN: [id number] Date of Birth: 16-Aug-1965  [facility] of [facility]) CM/SW Contact  Eduard Roux, [place] Phone Number: 11/13/2022, 11:45 AM  Clinical Narrative:     CSW patient at bedside. Patient confirmed he is now agreeable to short term rehab at [facility]. Patient states his preferred facility is [facility] in [place]. CSW [facility] has declined but h is insurance is not in network. CSW sent message to [facility] to review for placement- waiting on response.  Antony Blackbird, MSW, LCSW Clinical Social Worker       Expected Discharge Plan: Skilled Nursing Facility Barriers to Discharge: Continued Medical Work up, SNF Pending bed offer, Insurance Authorization  Expected Discharge Plan and Services   Discharge Planning Services: CM Consult Post Acute Care Choice: Durable Medical Equipment, Home Health Living arrangements for the past 2 months: Mobile Home Expected Discharge Date: 11/07/22               DME Arranged: Dan Humphreys rolling         HH Arranged: RN, PT, OT           Social Determinants of Health (SDOH) Interventions SDOH Screenings   Food Insecurity: No Food Insecurity (10/30/2022)  Housing: Low Risk  (10/30/2022)  Transportation Needs: No Transportation Needs (10/30/2022)  Utilities: Not At Risk (10/30/2022)  Financial Resource Strain: High Risk (01/09/2022)  Tobacco Use: High Risk (11/09/2022)    Readmission Risk Interventions     No data to display

## 2022-11-13 NOTE — H&P (View-Only) (Signed)
   Subjective: 5 Days Post-Op Procedure(s) (LRB): IRRIGATION AND DEBRIDEMENT OF FOOT AND WASHOUT (Left) DOS: 11/08/2022  Objective: Vital signs in last 24 hours: Temp:  [98.1 F (36.7 C)-98.6 F (37 C)] 98.6 F (37 C) (06/24 1951) Pulse Rate:  [66-76] 66 (06/24 1951) Resp:  [18] 18 (06/24 1951) BP: (125-148)/(66-75) 125/66 (06/24 1951) SpO2:  [90 %-100 %] 100 % (06/24 1951) Weight:  [72.5 kg] 72.5 kg (06/24 0420)  Recent Labs    11/11/22 0136 11/12/22 0153 11/13/22 0704  HGB 8.5* 8.6* 9.0*   Recent Labs    11/12/22 0153 11/13/22 0704  WBC 12.7* 11.7*  RBC 3.00* 3.06*  HCT 25.7* 26.8*  PLT 374 443*   Recent Labs    11/12/22 0153 11/13/22 0704  NA 130* 133*  K 4.5 4.5  CL 96* 98  CO2 23 24  BUN 26* 34*  CREATININE 1.02 1.31*  GLUCOSE 241* 213*  CALCIUM 9.4 9.6   No results for input(s): "LABPT", "INR" in the last 72 hours.  Physical Exam: Sutures intact.  Central portion of the amputation site is open.  Scant seropurulent drainage.  No malodor.  Overall amputation site appears somewhat stable.  MR FOOT LEFT W WO CONTRAST 11/13/2022 1010AM IMPRESSION: 1. 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 Acute osteomyelitis within the residual fifth metatarsal, as described. 2. Bone marrow edema is present throughout the proximal phalanx of the fifth toe is favored to represent early acute osteomyelitis. 3. Soft tissue wound or ulceration at the lateral forefoot at the surgical site. Partially rim enhancing fluid and air collection within the lateral forefoot measuring approximately 6.2 x 1.2 x 3.0 cm. Findings are most compatible with phlegmon/developing abscess.  Assessment/Plan: 5 Days Post-Op Procedure(s) (LRB): IRRIGATION AND DEBRIDEMENT OF FOOT AND WASHOUT (Left) DOS: 11/08/2022 Abdominal aortogram w/ LE balloon angioplasty.  Dr. Sherald Hess [facility].  11/03/2022  -Patient seen at bedside.  Continues to have pain associated  to the foot -Repeat MRI ordered today by internal medicine.  Findings consistent for residual osteomyelitis -ID recommends 6 weeks of Augmentin and doxycycline. -PT recommending SNF on discharge. -Did not realize when seeing patient that repeat MRI had been ordered. Will need to discuss with patient possibility of return to OR for more aggressive 5th ray amptutation. If patient is amenable to this plan, will tentatively scheduled for Wednesday, 09/15/2022. -Will follow    Felecia Shelling 11/13/2022, 11:20 PM  Felecia Shelling, DPM [facility]  Dr. Felecia Shelling, DPM    [address] [place], [place] [address]                Office [phone]  Fax [phone]

## 2022-11-14 DIAGNOSIS — M86172 Other acute osteomyelitis, left ankle and foot: Secondary | ICD-10-CM | POA: Diagnosis not present

## 2022-11-14 LAB — CBC WITH DIFFERENTIAL/PLATELET
Abs Immature Granulocytes: 0.29 10*3/uL — ABNORMAL HIGH (ref 0.00–0.07)
Basophils Absolute: 0.1 10*3/uL (ref 0.0–0.1)
Basophils Relative: 1 %
Eosinophils Absolute: 0.3 10*3/uL (ref 0.0–0.5)
Eosinophils Relative: 3 %
HCT: 25.2 % — ABNORMAL LOW (ref 39.0–52.0)
Hemoglobin: 8.2 g/dL — ABNORMAL LOW (ref 13.0–17.0)
Immature Granulocytes: 3 %
Lymphocytes Relative: 18 %
Lymphs Abs: 1.9 10*3/uL (ref 0.7–4.0)
MCH: 27.8 pg (ref 26.0–34.0)
MCHC: 32.5 g/dL (ref 30.0–36.0)
MCV: 85.4 fL (ref 80.0–100.0)
Monocytes Absolute: 0.6 10*3/uL (ref 0.1–1.0)
Monocytes Relative: 6 %
Neutro Abs: 7.2 10*3/uL (ref 1.7–7.7)
Neutrophils Relative %: 69 %
Platelets: 444 10*3/uL — ABNORMAL HIGH (ref 150–400)
RBC: 2.95 MIL/uL — ABNORMAL LOW (ref 4.22–5.81)
RDW: 14.3 % (ref 11.5–15.5)
WBC: 10.3 10*3/uL (ref 4.0–10.5)
nRBC: 0 % (ref 0.0–0.2)

## 2022-11-14 LAB — BASIC METABOLIC PANEL
Anion gap: 10 (ref 5–15)
BUN: 31 mg/dL — ABNORMAL HIGH (ref 6–20)
CO2: 24 mmol/L (ref 22–32)
Calcium: 9.3 mg/dL (ref 8.9–10.3)
Chloride: 99 mmol/L (ref 98–111)
Creatinine, Ser: 1.17 mg/dL (ref 0.61–1.24)
GFR, Estimated: >60 mL/min (ref >60)
Glucose, Bld: 189 mg/dL — ABNORMAL HIGH (ref 70–99)
Potassium: 4.5 mmol/L (ref 3.5–5.1)
Sodium: 133 mmol/L — ABNORMAL LOW (ref 135–145)

## 2022-11-14 LAB — GLUCOSE, CAPILLARY
Glucose-Capillary: 182 mg/dL — ABNORMAL HIGH (ref 70–99)
Glucose-Capillary: 240 mg/dL — ABNORMAL HIGH (ref 70–99)
Glucose-Capillary: 151 mg/dL — ABNORMAL HIGH (ref 70–99)
Glucose-Capillary: 328 mg/dL — ABNORMAL HIGH (ref 70–99)

## 2022-11-14 LAB — POCT ACTIVATED CLOTTING TIME: Activated Clotting Time: 208 seconds

## 2022-11-14 NOTE — Progress Notes (Signed)
   Subjective: 6 Days Post-Op Procedure(s) (LRB): IRRIGATION AND DEBRIDEMENT OF FOOT AND WASHOUT (Left) DOS: 11/08/2022  Objective: Vital signs in last 24 hours: Temp:  [98 F (36.7 C)-98.6 F (37 C)] 98.4 F (36.9 C) (06/25 2041) Pulse Rate:  [68-79] 74 (06/25 2041) Resp:  [15-19] 15 (06/25 1636) BP: (120-152)/(62-76) 120/69 (06/25 2041) SpO2:  [97 %-100 %] 99 % (06/25 2041)  Recent Labs    11/12/22 0153 11/13/22 0704 11/14/22 0800  HGB 8.6* 9.0* 8.2*   Recent Labs    11/13/22 0704 11/14/22 0800  WBC 11.7* 10.3  RBC 3.06* 2.95*  HCT 26.8* 25.2*  PLT 443* 444*   Recent Labs    11/13/22 0704 11/14/22 0800  NA 133* 133*  K 4.5 4.5  CL 98 99  CO2 24 24  BUN 34* 31*  CREATININE 1.31* 1.17  GLUCOSE 213* 189*  CALCIUM 9.6 9.3   No results for input(s): "LABPT", "INR" in the last 72 hours.  Physical Exam: Sutures intact.  Central portion of the amputation site is open.  Scant seropurulent drainage.  No malodor.  Overall amputation site appears somewhat stable.  MR FOOT LEFT W WO CONTRAST 11/13/2022 1010AM IMPRESSION: 1. 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 Acute osteomyelitis within the residual fifth metatarsal, as described. 2. Bone marrow edema is present throughout the proximal phalanx of the fifth toe is favored to represent early acute osteomyelitis. 3. Soft tissue wound or ulceration at the lateral forefoot at the surgical site. Partially rim enhancing fluid and air collection within the lateral forefoot measuring approximately 6.2 x 1.2 x 3.0 cm. Findings are most compatible with phlegmon/developing abscess.  Assessment/Plan: 6 Days Post-Op Procedure(s) (LRB): IRRIGATION AND DEBRIDEMENT OF FOOT AND WASHOUT (Left) DOS: 11/08/2022 Abdominal aortogram w/ LE balloon angioplasty.  Dr. Sherald Hess [facility]VS.  11/03/2022  -Patient seen at bedside.  Continues to have pain associated to the foot -Reviewed repeat MRI w/  patient. Unfortunately has residual acute osteomyelitis involving the remaining portion of 5th metatarsal and fifth ray.  -Recommend return to OR for revisional fifth ray amputation. Patient understandably hesitant for repeat surgery to the foot. Procedure discussed in detail to the patient in an attempt to salvage the foot and surgically eradicate the remaining osteo. Patient understands.  -Plan for return to OR tomorrow after 5pm for fifth ray amptuation of the left foot. -NPO after full breakfast.  -Continue abx  as per ID recs. Appreciated.  -Will follow     Felecia Shelling 11/14/2022, 10:56 PM  Felecia Shelling, DPM [facility]  Dr. Felecia Shelling, DPM    [address] [place], [place] [address]                Office [phone]  Fax [phone]

## 2022-11-14 NOTE — H&P (View-Only) (Signed)
   Subjective: 6 Days Post-Op Procedure(s) (LRB): IRRIGATION AND DEBRIDEMENT OF FOOT AND WASHOUT (Left) DOS: 11/08/2022  Objective: Vital signs in last 24 hours: Temp:  [98 F (36.7 C)-98.6 F (37 C)] 98.4 F (36.9 C) (06/25 2041) Pulse Rate:  [68-79] 74 (06/25 2041) Resp:  [15-19] 15 (06/25 1636) BP: (120-152)/(62-76) 120/69 (06/25 2041) SpO2:  [97 %-100 %] 99 % (06/25 2041)  Recent Labs    11/12/22 0153 11/13/22 0704 11/14/22 0800  HGB 8.6* 9.0* 8.2*   Recent Labs    11/13/22 0704 11/14/22 0800  WBC 11.7* 10.3  RBC 3.06* 2.95*  HCT 26.8* 25.2*  PLT 443* 444*   Recent Labs    11/13/22 0704 11/14/22 0800  NA 133* 133*  K 4.5 4.5  CL 98 99  CO2 24 24  BUN 34* 31*  CREATININE 1.31* 1.17  GLUCOSE 213* 189*  CALCIUM 9.6 9.3   No results for input(s): "LABPT", "INR" in the last 72 hours.  Physical Exam: Sutures intact.  Central portion of the amputation site is open.  Scant seropurulent drainage.  No malodor.  Overall amputation site appears somewhat stable.  MR FOOT LEFT W WO CONTRAST 11/13/2022 1010AM IMPRESSION: 1. 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 Acute osteomyelitis within the residual fifth metatarsal, as described. 2. Bone marrow edema is present throughout the proximal phalanx of the fifth toe is favored to represent early acute osteomyelitis. 3. Soft tissue wound or ulceration at the lateral forefoot at the surgical site. Partially rim enhancing fluid and air collection within the lateral forefoot measuring approximately 6.2 x 1.2 x 3.0 cm. Findings are most compatible with phlegmon/developing abscess.  Assessment/Plan: 6 Days Post-Op Procedure(s) (LRB): IRRIGATION AND DEBRIDEMENT OF FOOT AND WASHOUT (Left) DOS: 11/08/2022 Abdominal aortogram w/ LE balloon angioplasty.  Dr. Christopher Clark VVS.  11/03/2022  -Patient seen at bedside.  Continues to have pain associated to the foot -Reviewed repeat MRI w/  patient. Unfortunately has residual acute osteomyelitis involving the remaining portion of 5th metatarsal and fifth ray.  -Recommend return to OR for revisional fifth ray amputation. Patient understandably hesitant for repeat surgery to the foot. Procedure discussed in detail to the patient in an attempt to salvage the foot and surgically eradicate the remaining osteo. Patient understands.  -Plan for return to OR tomorrow after 5pm for fifth ray amptuation of the left foot. -NPO after full breakfast.  -Continue abx  as per ID recs. Appreciated.  -Will follow     Kenneth Bailey 11/14/2022, 10:56 PM  Kenneth Bailey, DPM [facility]  Kenneth Bailey, DPM    [address]                                    [place], [place] [address]                Office [phone]  Fax [phone]    

## 2022-11-14 NOTE — Progress Notes (Signed)
PROGRESS NOTE    Kenneth Bailey  FIE:[id number] DOB: 07/13/1965 DOA: 10/30/2022 PCP: [name], MD   Brief Narrative:  57 year old male with history of uncontrolled diabetes type 2, coronary artery disease status post CABG, chronic combined systolic/diastolic CHF, tobacco abuse who presented with complaints of persistent left foot pain.  Workup revealed osteomyelitis of the fifth metatarsal head as well as a phlegmon and early abscess.  Seen by podiatry, status post left foot incision and drainage with resection of fifth metatarsal head on 10/31/22.  Wound cultures grew MRSA and Enterococcus faecalis.  ID consulted.  Vascular surgery also consulted due to concern for peripheral artery disease.  Hospital course complicated by hematemesis, seen by GI, underwent EGD and found to have reflux esophagitis, nonbleeding gastric ulcer for which he was started on p.o. Protonix 40 mg twice daily.  Repeat I&D of left foot and washout was done on 11/08/22.  PT/OT recommending SNF on discharge.  Repeat MRI left foot done on 11/13/2022 revealed findings consistent with residual osteomyelitis.  Plan for possible return to the OR on Wednesday, 11/15/2022, per podiatry.  11/14/2022: The patient was seen and examined at his bedside.  Reports pain in his left foot.  Pain management in place.  Assessment & Plan:   Principal Problem:   Osteomyelitis (HCC) Active Problems:   Abscess of left foot   Gastric ulcer   Duodenal ulcer   [place] grade D esophagitis   Sepsis (HCC)   Left fifth toe/lateral foot cellulitis with osteomyelitis, POA Seen by podiatry, status post left foot I&D and resection of fifth metatarsal on 10/31/22.  Wound culture showed MRSA and Enterococcus faecalis.  ID also following, he received a few days of IV vancomycin, cefepime.  Given oritavancin.  Doxycycline 11/11/2022 for 14 days. Podiatry repeated I&D of foot and washout on 11/08/22.  Found to have a lot of necrotic tissue.  Wound  cultures repeated.  ID reconsulted because of worsening leukocytosis, concern for nonhealing wound.  Dr. Ashley Royalty discussed the case with ID Dr. Danelle Earthly, she recommended 6 weeks of Augmentin and doxycycline. Complains of persistent left hip pain.  Left foot MRI ordered in 11/13/2022 to further assess. Repeat MRI left foot done on 11/13/2022 revealed findings consistent with residual osteomyelitis.  Plan for possible return to the OR on Wednesday, 11/15/2022, per podiatry. Continue pain control and bowel regimen   Peripheral artery disease:  Seen by vascular surgery, status post abdominal aortogram, left posterior tibial artery and tibioperoneal trunk angioplasty.  Currently on aspirin, Plavix, Zetia and Lopid.   GI bleed: Hospital course was remarkable for nausea, vomiting, hematemesis, black stools, GI consulted, underwent EGD and found to have reflux esophagitis, nonbleeding gastric ulcer.  Currently on Protonix 40 mg twice daily.  Hemoglobin dropped to the range of 7.5 on 11/10/2022 but no new episode of hematochezia or melena.   Continue twice daily p.o. Protonix   Chronic combined systolic/diastolic CHF:  Last 2D echo on 09/12/2022 with LVEF 45 to 50% with grade 1 diastolic dysfunction. Currently euvolemic Strict I's and O's and daily weight, currently not recorded   Tobacco abuse:  Continue nicotine patch   Hyperlipidemia:  On gemfibrozil, ezitimibe   Coronary artery disease status post CABG:  Denies any anginal symptoms. Currently on aspirin, Plavix, ezetimibe and Lopid   Uncontrolled diabetes type 2:  Recent A1c of 10.7.  Monitor blood sugars.  Continue current insulin regimen.  Diabetic coordinator following   Resolved hyperkalemia/AKI Stable now after iv fluids  were given  Mild hyponatremia Serum sodium 130, serum glucose 241 Corrected sodium for hyperglycemia 132. Continue to monitor.   Debility/deconditioning: Patient lives with his mother.  PT is recommending SNF  on discharge.  TOC following  Chronic constipation Continue bowel regimen.  Time: 50 minutes.  Estimated body mass index is 24.3 kg/m as calculated from the following:   Height as of this encounter: 5\' 8"  (1.727 m).   Weight as of this encounter: 72.5 kg.  DVT prophylaxis: scds Code Status:full Family Communication: None at bedside. Disposition Plan:  Status is: Inpatient Remains inpatient appropriate because: Possible return to the OR on 11/15/2022   Consultants: id gi vascular podiatry  Procedures: egd angioplasty I and d Antimicrobials:  Anti-infectives (From admission, onward)    Start     Dose/Rate Route Frequency Ordered Stop   11/11/22 1000  doxycycline (VIBRA-TABS) tablet 100 mg  Status:  Discontinued        100 mg Oral Every 12 hours 11/03/22 1616 11/10/22 0800   11/11/22 0000  doxycycline (VIBRAMYCIN) 100 MG capsule  Status:  Discontinued        100 mg Oral 2 times daily 11/07/22 1624 11/09/22    11/10/22 0800  doxycycline (VIBRA-TABS) tablet 100 mg        100 mg Oral Every 12 hours 11/10/22 0800 12/22/22 0959   11/09/22 1045  amoxicillin-clavulanate (AUGMENTIN) 875-125 MG per tablet 1 tablet        1 tablet Oral Every 12 hours 11/09/22 0957 12/21/22 0959   11/09/22 0000  doxycycline (VIBRAMYCIN) 100 MG capsule        100 mg Oral 2 times daily 11/09/22 1424 12/21/22 2359   11/09/22 0000  amoxicillin-clavulanate (AUGMENTIN) 875-125 MG tablet        1 tablet Oral 2 times daily 11/09/22 1424 12/21/22 2359   11/04/22 0800  Oritavancin Diphosphate (ORBACTIV) 1,200 mg in dextrose 5 % IVPB        1,200 mg 333.3 mL/hr over 180 Minutes Intravenous Once 11/03/22 1611 11/04/22 1158   11/03/22 0500  vancomycin (VANCOREADY) IVPB 1500 mg/300 mL  Status:  Discontinued        1,500 mg 150 mL/hr over 120 Minutes Intravenous Every 24 hours 11/02/22 1439 11/03/22 1611   11/02/22 1445  ceFEPIme (MAXIPIME) 2 g in sodium chloride 0.9 % 100 mL IVPB  Status:  Discontinued        2  g 200 mL/hr over 30 Minutes Intravenous Every 8 hours 11/02/22 1437 11/03/22 1430   11/02/22 0500  vancomycin (VANCOREADY) IVPB 1250 mg/250 mL  Status:  Discontinued        1,250 mg 166.7 mL/hr over 90 Minutes Intravenous Every 24 hours 11/01/22 0721 11/02/22 1439   11/01/22 0430  vancomycin (VANCOCIN) IVPB 1000 mg/200 mL premix        1,000 mg 200 mL/hr over 60 Minutes Intravenous  Once 11/01/22 0339 11/01/22 0620   11/01/22 0430  ceFEPIme (MAXIPIME) 2 g in sodium chloride 0.9 % 100 mL IVPB  Status:  Discontinued        2 g 200 mL/hr over 30 Minutes Intravenous Every 12 hours 11/01/22 0342 11/02/22 1437   11/01/22 0400  ceFEPIme (MAXIPIME) 2 g in sodium chloride 0.9 % 100 mL IVPB  Status:  Discontinued        2 g 200 mL/hr over 30 Minutes Intravenous Every 24 hours 10/31/22 0342 11/01/22 0341   10/31/22 0430  ceFEPIme (MAXIPIME) 2 g in sodium chloride 0.9 %  100 mL IVPB        2 g 200 mL/hr over 30 Minutes Intravenous  Once 10/31/22 0342 10/31/22 0434   10/30/22 2138  vancomycin variable dose per unstable renal function (pharmacist dosing)  Status:  Discontinued         Does not apply See admin instructions 10/30/22 2138 11/01/22 0721   10/30/22 1745  vancomycin (VANCOREADY) IVPB 1500 mg/300 mL        1,500 mg 150 mL/hr over 120 Minutes Intravenous  Once 10/30/22 1743 10/30/22 2058         Objective: Vitals:   11/14/22 0029 11/14/22 0354 11/14/22 0741 11/14/22 1107  BP: (!) 152/71 (!) 141/67 (!) 141/62 132/76  Pulse: 79 74 72 68  Resp: 18 18 19 19   Temp: 98.4 F (36.9 C) 98.2 F (36.8 C) 98.1 F (36.7 C) 98 F (36.7 C)  TempSrc: Oral Oral Oral Oral  SpO2: 99% 100% 100% 100%  Weight:      Height:        Intake/Output Summary (Last 24 hours) at 11/14/2022 1401 Last data filed at 11/13/2022 2100 Gross per 24 hour  Intake 320.7 ml  Output --  Net 320.7 ml    Filed Weights   11/11/22 0500 11/12/22 0440 11/13/22 0420  Weight: 72.4 kg 72.8 kg 72.5 kg     Examination:  General exam: Well-developed well-nourished in no acute distress.  He is alert and oriented x 3. Respiratory system: Clear to auscultation with no wheezes or rales. Cardiovascular system: Regular rate and rhythm no rubs or gallops.   Gastrointestinal system: Soft nontender with normal bowel sounds present.   Central nervous system: Alert oriented. Extremities: Left foot is covered with wound dressing. Psychiatry: Mood is appropriate for condition and setting.    Data Reviewed: I have personally reviewed following labs and imaging studies  CBC: Recent Labs  Lab 11/08/22 0109 11/09/22 0120 11/10/22 0138 11/11/22 0136 11/12/22 0153 11/13/22 0704 11/14/22 0800  WBC 14.4*   < > 14.6* 16.4* 12.7* 11.7* 10.3  NEUTROABS 11.8*  --   --   --   --   --  7.2  HGB 9.1*   < > 7.5* 8.5* 8.6* 9.0* 8.2*  HCT 26.9*   < > 22.8* 24.9* 25.7* 26.8* 25.2*  MCV 83.3   < > 85.4 85.6 85.7 87.6 85.4  PLT 298   < > 298 365 374 443* 444*   < > = values in this interval not displayed.   Basic Metabolic Panel: Recent Labs  Lab 11/08/22 0109 11/09/22 0120 11/10/22 0138 11/12/22 0153 11/13/22 0704 11/14/22 0800  NA 130* 129* 131* 130* 133* 133*  K 4.4 5.8* 4.5 4.5 4.5 4.5  CL 95* 93* 95* 96* 98 99  CO2 24 25 23 23 24 24   GLUCOSE 288* 269* 218* 241* 213* 189*  BUN 24* 32* 33* 26* 34* 31*  CREATININE 1.22 1.53* 1.23 1.02 1.31* 1.17  CALCIUM 9.1 9.3 8.8* 9.4 9.6 9.3  MG 1.7  --   --   --  2.0  --   PHOS 3.7  --   --   --  3.8  --    GFR: Estimated Creatinine Clearance: 69 mL/min (by C-G formula based on SCr of 1.17 mg/dL). Liver Function Tests: Recent Labs  Lab 11/08/22 0109 11/12/22 0153  AST 19 18  ALT 30 18  ALKPHOS 78 105  BILITOT 0.5 0.4  PROT 6.4* 6.9  ALBUMIN 2.5* 2.6*   No  results for input(s): "LIPASE", "AMYLASE" in the last 168 hours. No results for input(s): "AMMONIA" in the last 168 hours. Coagulation Profile: No results for input(s): "INR", "PROTIME"  in the last 168 hours.  Cardiac Enzymes: No results for input(s): "CKTOTAL", "CKMB", "CKMBINDEX", "TROPONINI" in the last 168 hours. BNP (last 3 results) No results for input(s): "PROBNP" in the last 8760 hours. HbA1C: No results for input(s): "HGBA1C" in the last 72 hours. CBG: Recent Labs  Lab 11/13/22 0606 11/13/22 1144 11/13/22 1707 11/13/22 2138 11/14/22 0624  GLUCAP 212* 260* 250* 194* 182*   Lipid Profile: No results for input(s): "CHOL", "HDL", "LDLCALC", "TRIG", "CHOLHDL", "LDLDIRECT" in the last 72 hours. Thyroid Function Tests: No results for input(s): "TSH", "T4TOTAL", "FREET4", "T3FREE", "THYROIDAB" in the last 72 hours. Anemia Panel: No results for input(s): "VITAMINB12", "FOLATE", "FERRITIN", "TIBC", "IRON", "RETICCTPCT" in the last 72 hours. Sepsis Labs: No results for input(s): "PROCALCITON", "LATICACIDVEN" in the last 168 hours.   Recent Results (from the past 240 hour(s))  Culture, blood (x 2)     Status: None   Collection Time: 11/04/22  2:50 PM   Specimen: BLOOD  Result Value Ref Range Status   Specimen Description BLOOD SITE NOT SPECIFIED  Final   Special Requests   Final    BOTTLES DRAWN AEROBIC AND ANAEROBIC Blood Culture results may not be optimal due to an inadequate volume of blood received in culture bottles   Culture   Final    NO GROWTH 5 DAYS Performed at [facility], [address] [place], [place] [address]    Report Status 11/09/2022 FINAL  Final  Culture, blood (x 2)     Status: None   Collection Time: 11/04/22  2:54 PM   Specimen: BLOOD  Result Value Ref Range Status   Specimen Description BLOOD BLOOD RIGHT HAND  Final   Special Requests   Final    BOTTLES DRAWN AEROBIC AND ANAEROBIC Blood Culture results may not be optimal due to an inadequate volume of blood received in culture bottles   Culture   Final    NO GROWTH 5 DAYS Performed at [facility], [address] [place], [place] [address]    Report Status  11/09/2022 FINAL  Final  Aerobic/Anaerobic Culture w Gram Stain (surgical/deep wound)     Status: None   Collection Time: 11/08/22  6:22 PM   Specimen: Foot, Left; Tissue  Result Value Ref Range Status   Specimen Description ABSCESS LEFT FOOT  Final   Special Requests SWABS SAMPLE A  Final   Gram Stain   Final    ABUNDANT WBC PRESENT, PREDOMINANTLY PMN RARE GRAM POSITIVE COCCI    Culture   Final    MODERATE METHICILLIN RESISTANT STAPHYLOCOCCUS AUREUS NO ANAEROBES ISOLATED Performed at [facility], [address] [place], [place] [address]    Report Status 11/13/2022 FINAL  Final   Organism ID, Bacteria METHICILLIN RESISTANT STAPHYLOCOCCUS AUREUS  Final      Susceptibility   Methicillin resistant staphylococcus aureus - MIC*    CIPROFLOXACIN >=8 RESISTANT Resistant     ERYTHROMYCIN >=8 RESISTANT Resistant     GENTAMICIN <=0.5 SENSITIVE Sensitive     OXACILLIN >=4 RESISTANT Resistant     TETRACYCLINE <=1 SENSITIVE Sensitive     VANCOMYCIN 1 SENSITIVE Sensitive     TRIMETH/SULFA >=320 RESISTANT Resistant     CLINDAMYCIN >=8 RESISTANT Resistant     RIFAMPIN <=0.5 SENSITIVE Sensitive     Inducible Clindamycin  NEGATIVE Sensitive     LINEZOLID 2 SENSITIVE Sensitive     * MODERATE METHICILLIN RESISTANT STAPHYLOCOCCUS AUREUS  Aerobic/Anaerobic Culture w Gram Stain (surgical/deep wound)     Status: None   Collection Time: 11/08/22  6:23 PM   Specimen: Foot, Left; Tissue  Result Value Ref Range Status   Specimen Description BONE  Final   Special Requests 5TH  METATARSAL  Final   Gram Stain NO WBC SEEN NO ORGANISMS SEEN   Final   Culture   Final    RARE METHICILLIN RESISTANT STAPHYLOCOCCUS AUREUS RARE ENTEROCOCCUS FAECALIS CRITICAL RESULT CALLED TO, READ BACK BY AND VERIFIED WITH: Alene Mires [id number] AT 1232 BY EC NO ANAEROBES ISOLATED Performed at [facility], [address], [place], [place] [address]    Report Status 11/13/2022 FINAL  Final   Organism ID,  Bacteria METHICILLIN RESISTANT STAPHYLOCOCCUS AUREUS  Final   Organism ID, Bacteria ENTEROCOCCUS FAECALIS  Final      Susceptibility   Enterococcus faecalis - MIC*    AMPICILLIN <=2 SENSITIVE Sensitive     VANCOMYCIN 2 SENSITIVE Sensitive     GENTAMICIN SYNERGY SENSITIVE Sensitive     * RARE ENTEROCOCCUS FAECALIS   Methicillin resistant staphylococcus aureus - MIC*    CIPROFLOXACIN >=8 RESISTANT Resistant     ERYTHROMYCIN >=8 RESISTANT Resistant     GENTAMICIN <=0.5 SENSITIVE Sensitive     OXACILLIN >=4 RESISTANT Resistant     TETRACYCLINE <=1 SENSITIVE Sensitive     VANCOMYCIN 1 SENSITIVE Sensitive     TRIMETH/SULFA >=320 RESISTANT Resistant     CLINDAMYCIN >=8 RESISTANT Resistant     RIFAMPIN <=0.5 SENSITIVE Sensitive     Inducible Clindamycin NEGATIVE Sensitive     LINEZOLID 2 SENSITIVE Sensitive     * RARE METHICILLIN RESISTANT STAPHYLOCOCCUS AUREUS      Radiology Studies: MR FOOT LEFT W WO CONTRAST  Result Date: 11/13/2022 CLINICAL DATA:  Foot swelling, diabetic, osteomyelitis suspected, xray done EXAM: MRI OF THE LEFT FOREFOOT WITHOUT AND WITH CONTRAST TECHNIQUE: Multiplanar, multisequence MR imaging of the left forefoot was performed both before and after administration of intravenous contrast. CONTRAST:  7mL GADAVIST GADOBUTROL 1 MMOL/ML IV SOLN COMPARISON:  X-ray 11/08/2022.  MRI 10/30/2022 FINDINGS: Bones/Joint/Cartilage Postsurgical changes following resection of the distal aspect of the fifth metatarsal to the level of the proximal diaphysis. There is bone marrow edema and enhancement with intermediate to low T1 signal within the residual fifth metatarsal extending to the level of the proximal metaphysis. Mild bone marrow edema extends to the base of the fifth metatarsal (series 6, images 8-9). Bone marrow edema is present throughout the proximal phalanx of the fifth toe without associated low T1 bone marrow signal. Remaining bony structures of the forefoot demonstrate  preserved bone marrow signal without fracture, dislocation, or marrow replacement. Similar mild degenerative changes. Ligaments Intact Lisfranc ligament.  No collateral ligament injury. Muscles and Tendons Diffuse edema-like signal of the foot musculature which may represent changes related to denervation and/or myositis. No tenosynovitis. Soft tissues Soft tissue wound or ulceration at the lateral forefoot at the surgical site. Partially rim enhancing fluid and air collection within the lateral forefoot measuring approximately 6.2 x 1.2 x 3.0 cm. IMPRESSION: 1. 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 Acute osteomyelitis within the residual fifth metatarsal, as described. 2. Bone marrow edema is present throughout the proximal phalanx of the fifth toe is favored to represent early acute osteomyelitis. 3. Soft tissue wound or ulceration  at the lateral forefoot at the surgical site. Partially rim enhancing fluid and air collection within the lateral forefoot measuring approximately 6.2 x 1.2 x 3.0 cm. Findings are most compatible with phlegmon/developing abscess. Electronically Signed   By: Duanne Guess D.O.   On: 11/13/2022 11:11     Scheduled Meds:  amoxicillin-clavulanate  1 tablet Oral Q12H   aspirin EC  81 mg Oral Daily   clopidogrel  75 mg Oral Daily   doxycycline  100 mg Oral Q12H   ezetimibe  10 mg Oral Daily   gemfibrozil  600 mg Oral BID AC   insulin aspart  0-15 Units Subcutaneous TID WC   insulin aspart  0-5 Units Subcutaneous QHS   insulin glargine-yfgn  15 Units Subcutaneous BID   melatonin  10 mg Oral QHS   nicotine  14 mg Transdermal Daily   pantoprazole  40 mg Oral BID   sodium chloride flush  3 mL Intravenous Q12H   Continuous Infusions:  sodium chloride       LOS: 15 days   35 min  Darlin Drop, MD  11/14/2022, 2:01 PM

## 2022-11-14 NOTE — Progress Notes (Signed)
Physical Therapy Treatment Patient Details Name: Kenneth Bailey MRN: [id number] DOB: 07-Jan-1966 Today's Date: 11/14/2022   History of Present Illness Pt is a 57 y.o. male admitted on 10/30/2022 with osteomyelitis. He is now s/p Left foot irrigation and debridement with resection of fifth metatarsal head 11/01/22. Pt underwent repeat I&D on 11/08/22. Past medical history significant for uncontrolled type 2 diabetes, diabetic polyneuropathy, coronary artery disease status post CABG, chronic combined diastolic and systolic CHF, current smoker,    PT Comments    Pt received in supine and agreeable to session with encouragement. Pt able to perform bed mobility with mod I and stand from low EOB with supervision. Pt refusing to don offloading shoe prior to standing and stating that he has been ambulating to the bathroom without shoe and heel WB. When educated on importance of precautions, pt becoming agitated and returning to supine and refusing further mobility. Pt agreeable to perform SLR in supine. Pt states "I don't care if I ever walk again" when encouraged to participate in therapy and follow precautions. Pt continues to benefit from PT services to progress toward functional mobility goals.     Recommendations for follow up therapy are one component of a multi-disciplinary discharge planning process, led by the attending physician.  Recommendations may be updated based on patient status, additional functional criteria and insurance authorization.     Assistance Recommended at Discharge Frequent or constant Supervision/Assistance  Patient can return home with the following A little help with bathing/dressing/bathroom;Help with stairs or ramp for entrance;Assistance with cooking/housework;A little help with walking and/or transfers   Equipment Recommendations  Rolling walker (2 wheels)    Recommendations for Other Services       Precautions / Restrictions Precautions Precautions:  Fall Precaution Comments: pt is on IV meds for pain Required Braces or Orthoses: Other Brace Other Brace: fore foot off weighting shoe Restrictions Weight Bearing Restrictions: Yes LLE Weight Bearing: Partial weight bearing LLE Partial Weight Bearing Percentage or Pounds: heel WB Other Position/Activity Restrictions: through heel per MD order     Mobility  Bed Mobility Overal bed mobility: Modified Independent Bed Mobility: Sit to Supine, Supine to Sit           General bed mobility comments: no physical assistance needed    Transfers Overall transfer level: Needs assistance Equipment used: Rolling walker (2 wheels) Transfers: Sit to/from Stand Sit to Stand: Supervision           General transfer comment: Pt standing from EOB with heel WB despite cues due to pt refusing to don offloading shoe. Pt stating that he cannot maintain NWB.    Ambulation/Gait               General Gait Details: deferred due to pt refusing offloading shoe and becoming agitated with education     Balance Overall balance assessment: Needs assistance Sitting-balance support: No upper extremity supported, Feet supported Sitting balance-Leahy Scale: Good Sitting balance - Comments: sitting EOB   Standing balance support: Bilateral upper extremity supported, During functional activity, Reliant on assistive device for balance Standing balance-Leahy Scale: Poor Standing balance comment: with RW support                            Cognition Arousal/Alertness: Awake/alert Behavior During Therapy: Impulsive Overall Cognitive Status: Impaired/Different from baseline  General Comments: Pt refusing use of offloading shoe and becoming upset when educated on precautions. Pt returning to supine quickly and refusing further mobility. Pt appearing depressed, stating "I don't care if i ever walk again".        Exercises General Exercises -  Lower Extremity Straight Leg Raises: AROM, Supine, Both, 5 reps    General Comments        Pertinent Vitals/Pain Pain Assessment Pain Assessment: 0-10 Pain Score: 7  Pain Location: L foot Pain Descriptors / Indicators: Pounding, Throbbing Pain Intervention(s): Monitored during session, Limited activity within patient's tolerance     PT Goals (current goals can now be found in the care plan section) Acute Rehab PT Goals Patient Stated Goal: to go home soon PT Goal Formulation: With patient Time For Goal Achievement: 11/23/22 Potential to Achieve Goals: Good Progress towards PT goals: Not progressing toward goals - comment (pt refusing mobility progression)    Frequency    Min 1X/week      PT Plan Current plan remains appropriate       AM-PAC PT "6 Clicks" Mobility   Outcome Measure  Help needed turning from your back to your side while in a flat bed without using bedrails?: None Help needed moving from lying on your back to sitting on the side of a flat bed without using bedrails?: None Help needed moving to and from a bed to a chair (including a wheelchair)?: A Little Help needed standing up from a chair using your arms (e.g., wheelchair or bedside chair)?: A Little Help needed to walk in hospital room?: A Little Help needed climbing 3-5 steps with a railing? : Total 6 Click Score: 18    End of Session   Activity Tolerance: Treatment limited secondary to agitation (limited by pt not following precautions) Patient left: in bed;with call bell/phone within reach Nurse Communication: Mobility status PT Visit Diagnosis: Other abnormalities of gait and mobility (R26.89);Pain Pain - Right/Left: Left Pain - part of body: Ankle and joints of foot     Time: 8469-6295 PT Time Calculation (min) (ACUTE ONLY): 8 min  Charges:  $Therapeutic Activity: 8-22 mins                     Johny Shock, PTA Acute Rehabilitation Services Secure Chat Preferred  [phone]  [phone]    Johny Shock 11/14/2022, 3:43 PM

## 2022-11-14 NOTE — TOC Progression Note (Addendum)
Transition of Care [facility]) - Progression Note    Patient Details  Name: Kenneth Bailey MRN: [id number] Date of Birth: [birth year]  Transition of [facility] CM/SW Contact  Eduard Roux, [place] Phone Number: 11/14/2022, 12:26 PM  Clinical Narrative:     CSW met with the patient at bedside. CSW explained [facility] has offered however, he will be responsible of co-pay $200.-250.00 per day. Patient states he needs to go to SNF and requested CSW inquired about financial plan.   However, patient went independently to bathroom- will continue to follow for PT current recommendations.   CSW sent message to [facility]- waiting on response.  Antony Blackbird, MSW, LCSW Clinical Social Worker    Expected Discharge Plan: Skilled Nursing Facility Barriers to Discharge: Continued Medical Work up, SNF Pending bed offer, Insurance Authorization  Expected Discharge Plan and Services   Discharge Planning Services: CM Consult Post Acute Care Choice: Durable Medical Equipment, Home Health Living arrangements for the past 2 months: Mobile Home Expected Discharge Date: 11/07/22               DME Arranged: Dan Humphreys rolling         HH Arranged: RN, PT, OT           Social Determinants of Health (SDOH) Interventions SDOH Screenings   Food Insecurity: No Food Insecurity (10/30/2022)  Housing: Low Risk  (10/30/2022)  Transportation Needs: No Transportation Needs (10/30/2022)  Utilities: Not At Risk (10/30/2022)  Financial Resource Strain: High Risk (01/09/2022)  Tobacco Use: High Risk (11/09/2022)    Readmission Risk Interventions     No data to display

## 2022-11-15 ENCOUNTER — Inpatient Hospital Stay (HOSPITAL_COMMUNITY): Payer: BC Managed Care – PPO

## 2022-11-15 ENCOUNTER — Inpatient Hospital Stay (HOSPITAL_COMMUNITY): Payer: BC Managed Care – PPO | Admitting: Certified Registered"

## 2022-11-15 ENCOUNTER — Encounter (HOSPITAL_COMMUNITY)
Admission: EM | Disposition: A | Discharge status: Home / Self Care | Payer: Self-pay | Source: Home / Self Care | Attending: Internal Medicine

## 2022-11-15 ENCOUNTER — Other Ambulatory Visit: Payer: Self-pay

## 2022-11-15 ENCOUNTER — Encounter (HOSPITAL_COMMUNITY): Payer: Self-pay | Admitting: Internal Medicine

## 2022-11-15 DIAGNOSIS — K269 Duodenal ulcer, unspecified as acute or chronic, without hemorrhage or perforation: Secondary | ICD-10-CM

## 2022-11-15 DIAGNOSIS — L02612 Cutaneous abscess of left foot: Secondary | ICD-10-CM | POA: Diagnosis not present

## 2022-11-15 DIAGNOSIS — M86172 Other acute osteomyelitis, left ankle and foot: Secondary | ICD-10-CM | POA: Diagnosis not present

## 2022-11-15 DIAGNOSIS — M86672 Other chronic osteomyelitis, left ankle and foot: Secondary | ICD-10-CM

## 2022-11-15 DIAGNOSIS — K253 Acute gastric ulcer without hemorrhage or perforation: Secondary | ICD-10-CM | POA: Diagnosis not present

## 2022-11-15 HISTORY — PX: AMPUTATION: SHX166

## 2022-11-15 LAB — BASIC METABOLIC PANEL
Anion gap: 11 (ref 5–15)
BUN: 32 mg/dL — ABNORMAL HIGH (ref 6–20)
CO2: 23 mmol/L (ref 22–32)
Calcium: 9 mg/dL (ref 8.9–10.3)
Chloride: 96 mmol/L — ABNORMAL LOW (ref 98–111)
Creatinine, Ser: 1.32 mg/dL — ABNORMAL HIGH (ref 0.61–1.24)
GFR, Estimated: >60 mL/min (ref >60)
Glucose, Bld: 273 mg/dL — ABNORMAL HIGH (ref 70–99)
Potassium: 4.3 mmol/L (ref 3.5–5.1)
Sodium: 130 mmol/L — ABNORMAL LOW (ref 135–145)

## 2022-11-15 LAB — GLUCOSE, CAPILLARY
Glucose-Capillary: 237 mg/dL — ABNORMAL HIGH (ref 70–99)
Glucose-Capillary: 169 mg/dL — ABNORMAL HIGH (ref 70–99)
Glucose-Capillary: 107 mg/dL — ABNORMAL HIGH (ref 70–99)
Glucose-Capillary: 118 mg/dL — ABNORMAL HIGH (ref 70–99)
Glucose-Capillary: 122 mg/dL — ABNORMAL HIGH (ref 70–99)
Glucose-Capillary: 143 mg/dL — ABNORMAL HIGH (ref 70–99)

## 2022-11-15 LAB — CBC WITH DIFFERENTIAL/PLATELET
Abs Immature Granulocytes: 0.43 10*3/uL — ABNORMAL HIGH (ref 0.00–0.07)
Basophils Absolute: 0.1 10*3/uL (ref 0.0–0.1)
Basophils Relative: 1 %
Eosinophils Absolute: 0.3 10*3/uL (ref 0.0–0.5)
Eosinophils Relative: 3 %
HCT: 26 % — ABNORMAL LOW (ref 39.0–52.0)
Hemoglobin: 8.5 g/dL — ABNORMAL LOW (ref 13.0–17.0)
Immature Granulocytes: 4 %
Lymphocytes Relative: 20 %
Lymphs Abs: 2.4 10*3/uL (ref 0.7–4.0)
MCH: 28.1 pg (ref 26.0–34.0)
MCHC: 32.7 g/dL (ref 30.0–36.0)
MCV: 85.8 fL (ref 80.0–100.0)
Monocytes Absolute: 0.7 10*3/uL (ref 0.1–1.0)
Monocytes Relative: 6 %
Neutro Abs: 7.9 10*3/uL — ABNORMAL HIGH (ref 1.7–7.7)
Neutrophils Relative %: 66 %
Platelets: 461 10*3/uL — ABNORMAL HIGH (ref 150–400)
RBC: 3.03 MIL/uL — ABNORMAL LOW (ref 4.22–5.81)
RDW: 14.2 % (ref 11.5–15.5)
WBC: 11.8 10*3/uL — ABNORMAL HIGH (ref 4.0–10.5)
nRBC: 0 % (ref 0.0–0.2)

## 2022-11-15 SURGERY — AMPUTATION DIGIT
Anesthesia: General | Laterality: Left

## 2022-11-15 MED ORDER — LACTATED RINGERS IV SOLN: INTRAVENOUS | Status: DC

## 2022-11-15 MED ORDER — DEXAMETHASONE SODIUM PHOSPHATE 10 MG/ML IJ SOLN
INTRAMUSCULAR | Status: AC
  Filled 2022-11-15: qty 1

## 2022-11-15 MED ORDER — CHLORHEXIDINE GLUCONATE 0.12 % MT SOLN
OROMUCOSAL | Status: AC
  Filled 2022-11-15: qty 15

## 2022-11-15 MED ORDER — PROMETHAZINE HCL 25 MG/ML IJ SOLN: 6.2500 mg | INTRAMUSCULAR | Status: DC | PRN

## 2022-11-15 MED ORDER — ONDANSETRON HCL 4 MG/2ML IJ SOLN
INTRAMUSCULAR | Status: DC | PRN
  Administered 2022-11-15: 4 mg via INTRAVENOUS

## 2022-11-15 MED ORDER — LIDOCAINE 2% (20 MG/ML) 5 ML SYRINGE
INTRAMUSCULAR | Status: AC
  Filled 2022-11-15: qty 10

## 2022-11-15 MED ORDER — PHENYLEPHRINE 80 MCG/ML (10ML) SYRINGE FOR IV PUSH (FOR BLOOD PRESSURE SUPPORT)
PREFILLED_SYRINGE | INTRAVENOUS | Status: DC | PRN
  Administered 2022-11-15 (×3): 80 ug via INTRAVENOUS

## 2022-11-15 MED ORDER — LIDOCAINE HCL (PF) 1 % IJ SOLN
INTRAMUSCULAR | Status: AC
  Filled 2022-11-15: qty 30

## 2022-11-15 MED ORDER — AMISULPRIDE (ANTIEMETIC) 5 MG/2ML IV SOLN: 10.0000 mg | Freq: Once | INTRAVENOUS | Status: DC | PRN

## 2022-11-15 MED ORDER — ORAL CARE MOUTH RINSE: 15.0000 mL | Freq: Once | OROMUCOSAL | Status: DC

## 2022-11-15 MED ORDER — ACETAMINOPHEN 10 MG/ML IV SOLN
INTRAVENOUS | Status: DC | PRN
  Administered 2022-11-15: 1000 mg via INTRAVENOUS

## 2022-11-15 MED ORDER — MEPERIDINE HCL 25 MG/ML IJ SOLN: 6.2500 mg | INTRAMUSCULAR | Status: DC | PRN

## 2022-11-15 MED ORDER — MIDAZOLAM HCL 2 MG/2ML IJ SOLN
INTRAMUSCULAR | Status: DC | PRN
  Administered 2022-11-15: 1 mg via INTRAVENOUS

## 2022-11-15 MED ORDER — ONDANSETRON HCL 4 MG/2ML IJ SOLN
INTRAMUSCULAR | Status: AC
  Filled 2022-11-15: qty 2

## 2022-11-15 MED ORDER — FENTANYL CITRATE (PF) 100 MCG/2ML IJ SOLN
INTRAMUSCULAR | Status: AC
  Filled 2022-11-15: qty 2

## 2022-11-15 MED ORDER — BUPIVACAINE HCL 0.5 % IJ SOLN
INTRAMUSCULAR | Status: AC
  Filled 2022-11-15: qty 1

## 2022-11-15 MED ORDER — ACETAMINOPHEN 10 MG/ML IV SOLN
INTRAVENOUS | Status: AC
  Filled 2022-11-15: qty 100

## 2022-11-15 MED ORDER — SODIUM CHLORIDE 0.9 % IR SOLN
Status: DC | PRN
  Administered 2022-11-15: 3000 mL

## 2022-11-15 MED ORDER — FENTANYL CITRATE (PF) 250 MCG/5ML IJ SOLN
INTRAMUSCULAR | Status: AC
  Filled 2022-11-15: qty 5

## 2022-11-15 MED ORDER — CHLORHEXIDINE GLUCONATE 0.12 % MT SOLN: 15.0000 mL | Freq: Once | OROMUCOSAL | Status: DC

## 2022-11-15 MED ORDER — FENTANYL CITRATE (PF) 250 MCG/5ML IJ SOLN
INTRAMUSCULAR | Status: DC | PRN
  Administered 2022-11-15: 50 ug via INTRAVENOUS

## 2022-11-15 MED ORDER — SUGAMMADEX SODIUM 200 MG/2ML IV SOLN
INTRAVENOUS | Status: DC | PRN
  Administered 2022-11-15: 200 mg via INTRAVENOUS

## 2022-11-15 MED ORDER — LIDOCAINE HCL 2 % IJ SOLN
INTRAMUSCULAR | Status: AC
  Filled 2022-11-15: qty 20

## 2022-11-15 MED ORDER — LIDOCAINE HCL 1 % IJ SOLN
INTRAMUSCULAR | Status: DC | PRN
  Administered 2022-11-15: 15 mL

## 2022-11-15 MED ORDER — PHENYLEPHRINE HCL-NACL 20-0.9 MG/250ML-% IV SOLN
INTRAVENOUS | Status: DC | PRN
  Administered 2022-11-15: 25 ug/min via INTRAVENOUS

## 2022-11-15 MED ORDER — 0.9 % SODIUM CHLORIDE (POUR BTL) OPTIME
TOPICAL | Status: DC | PRN
  Administered 2022-11-15: 1000 mL

## 2022-11-15 MED ORDER — PROPOFOL 10 MG/ML IV BOLUS
INTRAVENOUS | Status: AC
  Filled 2022-11-15: qty 20

## 2022-11-15 MED ORDER — ROCURONIUM BROMIDE 10 MG/ML (PF) SYRINGE
PREFILLED_SYRINGE | INTRAVENOUS | Status: DC | PRN
  Administered 2022-11-15: 40 mg via INTRAVENOUS

## 2022-11-15 MED ORDER — DEXAMETHASONE SODIUM PHOSPHATE 10 MG/ML IJ SOLN
INTRAMUSCULAR | Status: DC | PRN
  Administered 2022-11-15: 4 mg via INTRAVENOUS

## 2022-11-15 MED ORDER — INSULIN ASPART 100 UNIT/ML IJ SOLN
3.0000 [IU] | Freq: Three times a day (TID) | INTRAMUSCULAR | Status: DC
  Administered 2022-11-16 – 2022-11-17 (×5): 3 [IU] via SUBCUTANEOUS

## 2022-11-15 MED ORDER — LIDOCAINE 2% (20 MG/ML) 5 ML SYRINGE
INTRAMUSCULAR | Status: DC | PRN
  Administered 2022-11-15: 60 mg via INTRAVENOUS

## 2022-11-15 MED ORDER — INSULIN GLARGINE-YFGN 100 UNIT/ML ~~LOC~~ SOLN
18.0000 [IU] | Freq: Two times a day (BID) | SUBCUTANEOUS | Status: DC
  Administered 2022-11-16 – 2022-11-17 (×4): 18 [IU] via SUBCUTANEOUS
  Filled 2022-11-15 (×6): qty 0.18

## 2022-11-15 MED ORDER — PROPOFOL 10 MG/ML IV BOLUS
INTRAVENOUS | Status: DC | PRN
  Administered 2022-11-15: 40 mg via INTRAVENOUS
  Administered 2022-11-15: 100 mg via INTRAVENOUS

## 2022-11-15 MED ORDER — FENTANYL CITRATE (PF) 100 MCG/2ML IJ SOLN
50.0000 ug | INTRAMUSCULAR | Status: AC | PRN
  Administered 2022-11-15: 50 ug via INTRAVENOUS

## 2022-11-15 MED ORDER — MIDAZOLAM HCL 2 MG/2ML IJ SOLN
INTRAMUSCULAR | Status: AC
  Filled 2022-11-15: qty 2

## 2022-11-15 MED ORDER — HYDROMORPHONE HCL 1 MG/ML IJ SOLN: 0.2500 mg | INTRAMUSCULAR | Status: DC | PRN

## 2022-11-15 SURGICAL SUPPLY — 52 items
APL PRP STRL LF DISP 70% ISPRP (MISCELLANEOUS)
BAG COUNTER SPONGE SURGICOUNT (BAG) ×1 IMPLANT
BAG SPNG CNTER NS LX DISP (BAG) ×1
BLADE OSCILLATING/SAGITTAL (BLADE) ×1
BLADE SURG 15 STRL LF DISP TIS (BLADE) IMPLANT
BLADE SURG 15 STRL SS (BLADE)
BLADE SW THK.38XMED NAR THN (BLADE) IMPLANT
BNDG CMPR MED 10X6 ELC LF (GAUZE/BANDAGES/DRESSINGS) ×1
BNDG ELASTIC 4X5.8 VLCR STR LF (GAUZE/BANDAGES/DRESSINGS) IMPLANT
BNDG ELASTIC 6X10 VLCR STRL LF (GAUZE/BANDAGES/DRESSINGS) IMPLANT
BNDG GAUZE DERMACEA FLUFF 4 (GAUZE/BANDAGES/DRESSINGS) ×1 IMPLANT
BNDG GZE DERMACEA 4 6PLY (GAUZE/BANDAGES/DRESSINGS) ×1
CHLORAPREP W/TINT 26 (MISCELLANEOUS) IMPLANT
CNTNR URN SCR LID CUP LEK RST (MISCELLANEOUS) IMPLANT
CONT SPEC 4OZ STRL OR WHT (MISCELLANEOUS) ×1
COVER SURGICAL LIGHT HANDLE (MISCELLANEOUS) ×1 IMPLANT
CUFF TOURN SGL QUICK 18X4 (TOURNIQUET CUFF) ×1 IMPLANT
CUFF TOURN SGL QUICK 24 (TOURNIQUET CUFF) ×1
CUFF TRNQT CYL 24X4X16.5-23 (TOURNIQUET CUFF) IMPLANT
DRSG ADAPTIC 3X8 NADH LF (GAUZE/BANDAGES/DRESSINGS) IMPLANT
ELECT REM PT RETURN 9FT ADLT (ELECTROSURGICAL) ×1
ELECTRODE REM PT RTRN 9FT ADLT (ELECTROSURGICAL) IMPLANT
GAUZE PAD ABD 8X10 STRL (GAUZE/BANDAGES/DRESSINGS) ×1 IMPLANT
GAUZE SPONGE 4X4 12PLY STRL (GAUZE/BANDAGES/DRESSINGS) ×1 IMPLANT
GAUZE XEROFORM 1X8 LF (GAUZE/BANDAGES/DRESSINGS) ×1 IMPLANT
GLOVE BIO SURGEON STRL SZ8 (GLOVE) ×1 IMPLANT
GLOVE BIOGEL PI IND STRL 8 (GLOVE) ×1 IMPLANT
GOWN STRL REUS W/ TWL LRG LVL3 (GOWN DISPOSABLE) ×2 IMPLANT
GOWN STRL REUS W/TWL LRG LVL3 (GOWN DISPOSABLE) ×2
HANDPIECE INTERPULSE COAX TIP (DISPOSABLE) ×1
KIT BASIN OR (CUSTOM PROCEDURE TRAY) ×1 IMPLANT
KIT TURNOVER KIT B (KITS) ×1 IMPLANT
NDL PRECISIONGLIDE 27X1.5 (NEEDLE) ×1 IMPLANT
NEEDLE PRECISIONGLIDE 27X1.5 (NEEDLE) ×1 IMPLANT
NS IRRIG 1000ML POUR BTL (IV SOLUTION) ×1 IMPLANT
PACK ORTHO EXTREMITY (CUSTOM PROCEDURE TRAY) ×1 IMPLANT
PAD ARMBOARD 7.5X6 YLW CONV (MISCELLANEOUS) ×2 IMPLANT
PAD CAST 4YDX4 CTTN HI CHSV (CAST SUPPLIES) ×1 IMPLANT
PADDING CAST ABS COTTON 3X4 (CAST SUPPLIES) IMPLANT
PADDING CAST COTTON 4X4 STRL (CAST SUPPLIES) ×1
SET HNDPC FAN SPRY TIP SCT (DISPOSABLE) IMPLANT
SOL PREP POV-IOD 4OZ 10% (MISCELLANEOUS) ×1 IMPLANT
STAPLER VISISTAT 35W (STAPLE) IMPLANT
SUT PROLENE 4 0 PS 2 18 (SUTURE) IMPLANT
SUT VIC AB 3-0 PS2 18 (SUTURE)
SUT VIC AB 3-0 PS2 18XBRD (SUTURE) IMPLANT
SUT VICRYL 4-0 PS2 18IN ABS (SUTURE) IMPLANT
SYR CONTROL 10ML LL (SYRINGE) ×1 IMPLANT
TOWEL GREEN STERILE (TOWEL DISPOSABLE) ×1 IMPLANT
TOWEL GREEN STERILE FF (TOWEL DISPOSABLE) ×1 IMPLANT
TUBE CONNECTING 12X1/4 (SUCTIONS) ×1 IMPLANT
YANKAUER SUCT BULB TIP NO VENT (SUCTIONS) IMPLANT

## 2022-11-15 NOTE — Transfer of Care (Signed)
Immediate Anesthesia Transfer of Care Note  Patient: Kenneth Bailey  Procedure(s) Performed: AMPUTATION FITH RAY (Left)  Patient Location: PACU  Anesthesia Type:General  Level of Consciousness: sedated  Airway & Oxygen Therapy: Patient Spontanous Breathing and Patient connected to face mask oxygen  Post-op Assessment: Report given to RN and Post -op Vital signs reviewed and stable  Post vital signs: Reviewed and stable  Last Vitals:  Vitals Value Taken Time  BP 119/64 11/15/22 2208  Temp 36.7 C 11/15/22 2208  Pulse 78 11/15/22 2208  Resp 18 11/15/22 2149  SpO2 100 % 11/15/22 2208  Vitals shown include unvalidated device data.  Last Pain:  Vitals:   11/15/22 2208  TempSrc: Oral  PainSc:       Patients Stated Pain Goal: 0 (11/14/22 0415)  Complications: No notable events documented.

## 2022-11-15 NOTE — Op Note (Signed)
OPERATIVE REPORT Patient name: Kenneth Bailey MRN: [id number] DOB: Mar 19, 1966  DOS: 11/15/22  Preop Dx: Residual osteomyelitis fifth metatarsal left foot Postop Dx: same  Procedure:  1.  Partial foot amputation left  Surgeon: Felecia Shelling DPM  Anesthesia: General anesthesia  Hemostasis: None  EBL: 50 mL Materials: None Injectables: 10 mL of 1% lidocaine plain Pathology: Fifth metatarsal and 10 sent for discard  Condition: The patient tolerated the procedure and anesthesia well. No complications noted or reported   Justification for procedure: The patient is a 57 y.o. male who presents today for surgical correction of residual osteomyelitis within the lateral aspect of the left foot.  The patient was told benefits as well as possible side effects of the surgery. The patient consented for surgical correction. The patient consent form was reviewed. All patient questions were answered. No guarantees were expressed or implied. The patient and the surgeon both signed the patient consent form with the witness present and placed in the patient's chart.   Procedure in Detail: The patient was brought to the operating room, placed in the operating table in the supine position at which time an aseptic scrub and drape were performed about the patient's respective lower extremity after anesthesia was induced as described above. Attention was then directed to the surgical area where procedure number one commenced.  Procedure #1: Partial foot amputation left Attention was directed to the lateral aspect of the left foot where a racquet type incision was planned and made around the fifth digit extending proximal to the level of the fifth metatarsal tubercle.  The previous incision was ellipsed and excisionally removed from within the incision site.  At this time the fifth digit was grasped with a perforating towel clamp and distracted and disarticulated and removed in toto.  The proximal  portion of the fifth metatarsal remaining within the foot was also excisionally removed in toto.  #15 scalpel was utilized to free the fifth metatarsal from the surrounding tissue.  The soft tissue around the amputation site was evaluated and any necrotic nonviable tissue within the incision site was excisionally removed.  After appropriate debridement of the nonviable tissue down to healthier tissue, the adjacent fourth metatarsal was very prominent.  Decision was made intraoperatively to remove the distal half of the fourth metatarsal to allow for primary closure and alleviate tension from the incision site.  An osteotomy was created using a sagittal blade mounted on sagittal saw along the diaphysis of the fourth metatarsal.  The distal portion of the fourth metatarsal was removed in toto.  This allowed primary closure of the amputation site without any excessive tension to the soft tissue.  Copious irrigation using pulse lavage was utilized in preparation for primary closure.  Stainless steel skin staples were utilized to reapproximate the opposing edges of the amputation site.  Dry sterile compressive dressings were then applied to all previously mentioned incision sites about the patient's lower extremity. The patient was then transferred from the operating room to the recovery room having tolerated the procedure and anesthesia well. All vital signs are stable. After a brief stay in the recovery room the patient was readmitted to inpatient room with postoperative orders placed.    IMPRESSION: Margins appeared very clean intraoperatively.  Complete excision of the fifth metatarsal.  The remaining portion of the fourth metatarsal appeared healthy with good bone quality.  Felecia Shelling, DPM [facility]  Dr. Felecia Shelling, DPM    [address] [place], [place] [address]                Office [phone]  Fax [phone]

## 2022-11-15 NOTE — Anesthesia Procedure Notes (Signed)
Procedure Name: Intubation Date/Time: 11/15/2022 8:21 PM  Performed by: Lelon Perla, CRNAPre-anesthesia Checklist: Patient identified, Emergency Drugs available, Suction available and Patient being monitored Patient Re-evaluated:Patient Re-evaluated prior to induction Oxygen Delivery Method: Circle system utilized Preoxygenation: Pre-oxygenation with 100% oxygen Induction Type: IV induction Ventilation: Mask ventilation without difficulty Laryngoscope Size: Mac and 4 Grade View: Grade I Tube type: Oral Tube size: 7.5 mm Number of attempts: 1 Airway Equipment and Method: Stylet and Oral airway Placement Confirmation: ETT inserted through vocal cords under direct vision, positive ETCO2 and breath sounds checked- equal and bilateral Secured at: 21 cm Tube secured with: Tape Dental Injury: Teeth and Oropharynx as per pre-operative assessment

## 2022-11-15 NOTE — Progress Notes (Signed)
OT Cancellation Note  Patient Details Name: Kenneth Bailey MRN: [id number] DOB: [birth year]   Cancelled Treatment:    Reason Eval/Treat Not Completed: Patient declined, no reason specified (Pt declined participating in OT session. Reports that he recently received pain medication and is sleepy. Plan to return to OR this after for lt 5th ray amputation. Will follow up with pt tomorrow.)   Limmie Patricia, OTR/L,CBIS  Supplemental OT - [facility] Secure Chat Preferred   11/15/2022, 2:02 PM

## 2022-11-15 NOTE — Interval H&P Note (Signed)
History and Physical Interval Note:  11/15/2022 7:10 PM  Kenneth Bailey  has presented today for surgery, with the diagnosis of left foot osteomyelitis.  The various methods of treatment have been discussed with the patient and family. After consideration of risks, benefits and other options for treatment, the patient has consented to  Procedure(s): AMPUTATION FITH RAY (Left) as a surgical intervention.  The patient's history has been reviewed, patient examined, no change in status, stable for surgery.  I have reviewed the patient's chart and labs.  Questions were answered to the patient's satisfaction.     Felecia Shelling

## 2022-11-15 NOTE — Inpatient Diabetes Management (Signed)
Inpatient Diabetes Program Recommendations  AACE/ADA: New Consensus Statement on Inpatient Glycemic Control (2015)  Target Ranges:  Prepandial:   less than 140 mg/dL      Peak postprandial:   less than 180 mg/dL (1-2 hours)      Critically ill patients:  140 - 180 mg/dL   Lab Results  Component Value Date   GLUCAP 169 (H) 11/15/2022   HGBA1C 10.7 (H) 10/11/2022    Review of Glycemic Control  Latest Reference Range & Units 11/14/22 06:24 11/14/22 11:09 11/14/22 15:58 11/14/22 20:56 11/15/22 07:33 11/15/22 12:21  Glucose-Capillary 70 - 99 mg/dL 213 (H) 086 (H) 578 (H) 328 (H) 237 (H) 169 (H)   Diabetes history: DM2 Outpatient Diabetes medications: Semglee 12-50 units BID, s/s, metformin 1000 mg BID, Farxiga 10 QD Current orders for Inpatient glycemic control:  Semglee 15 units bid Novolog 0-15 units TID with meals    Inpatient Diabetes Program Recommendations:    -   Consider increasing Semglee 18 units bid -   Add Novolog 3 units tid meal coverage if eating >50% of meals  Thanks, Christena Deem RN, MSN, BC-ADM Inpatient Diabetes Coordinator Team Pager [phone] (8a-5p)

## 2022-11-15 NOTE — Anesthesia Preprocedure Evaluation (Addendum)
Anesthesia Evaluation  Patient identified by MRN, date of birth, ID band Patient awake    Reviewed: Allergy & Precautions, H&P , NPO status , Patient's Chart, lab work & pertinent test results  Airway Mallampati: II   Neck ROM: full    Dental  (+) Dental Advisory Given, Edentulous Upper, Edentulous Lower   Pulmonary COPD, Current Smoker and Patient abstained from smoking.   breath sounds clear to auscultation       Cardiovascular hypertension, + CAD and + CABG   Rhythm:regular Rate:Normal  Echo 08/2022  1. Left ventricular ejection fraction, by estimation, is 45 to 50%. The left ventricle has mildly decreased function. The left ventricle has no regional wall motion abnormalities. Left ventricular diastolic parameters are consistent with Grade I diastolic dysfunction (impaired relaxation). The average left ventricular global longitudinal strain is -17.7 %. The global longitudinal strain is abnormal.   2. TAPSE 1.5. Right ventricular systolic function is mildly reduced. The right ventricular size is normal.   3. The mitral valve is normal in structure. No evidence of mitral valve regurgitation. No evidence of mitral stenosis.   4. The aortic valve is tricuspid. Aortic valve regurgitation is not visualized. No aortic stenosis is present.   5. Aortic Normal DTA.   6. The inferior vena cava is normal in size with greater than 50% respiratory variability, suggesting right atrial pressure of 3 mmHg.    Cath 05/2021   Ost LAD to Prox LAD lesion is 90% stenosed.   Ramus lesion is 90% stenosed.   Prox RCA lesion is 95% stenosed.   Prox RCA to Mid RCA lesion is 80% stenosed.   Dist RCA lesion is 50% stenosed.   RPDA lesion is 90% stenosed.   Mid LM to Dist LM lesion is 50% stenosed.   Ost Cx to Prox Cx lesion is 40% stenosed.   1st Mrg lesion is 99% stenosed.   Mid Cx lesion is 50% stenosed.   2nd Diag lesion is 90% stenosed.    Mid LAD lesion is 70% stenosed.   Dist LAD lesion is 50% stenosed.   The left ventricular ejection fraction is 25-35% by visual estimate.      Neuro/Psych    GI/Hepatic PUD,,,  Endo/Other  diabetes, Type 2    Renal/GU      Musculoskeletal   Abdominal   Peds  Hematology  (+) Blood dyscrasia, anemia   Anesthesia Other Findings   Reproductive/Obstetrics                             Anesthesia Physical Anesthesia Plan  ASA: 3  Anesthesia Plan: General   Post-op Pain Management: Ofirmev IV (intra-op)*   Induction: Intravenous  PONV Risk Score and Plan: 1 and Ondansetron, Dexamethasone, Treatment may vary due to age or medical condition and Midazolam  Airway Management Planned: Oral ETT  Additional Equipment:   Intra-op Plan:   Post-operative Plan: Extubation in OR  Informed Consent: I have reviewed the patients History and Physical, chart, labs and discussed the procedure including the risks, benefits and alternatives for the proposed anesthesia with the patient or authorized representative who has indicated his/her understanding and acceptance.     Dental advisory given  Plan Discussed with: CRNA  Anesthesia Plan Comments:        Anesthesia Quick Evaluation

## 2022-11-15 NOTE — Interval H&P Note (Signed)
History and Physical Interval Note:  11/15/2022 5:58 PM  Kenneth Bailey  has presented today for surgery, with the diagnosis of left foot osteomyelitis.  The various methods of treatment have been discussed with the patient and family. After consideration of risks, benefits and other options for treatment, the patient has consented to  Procedure(s): AMPUTATION FITH RAY (Left) as a surgical intervention.  The patient's history has been reviewed, patient examined, no change in status, stable for surgery.  I have reviewed the patient's chart and labs.  Questions were answered to the patient's satisfaction.     Felecia Shelling

## 2022-11-15 NOTE — Brief Op Note (Signed)
11/15/2022  9:17 PM  PATIENT:  Tamera Punt  57 y.o. male  PRE-OPERATIVE DIAGNOSIS:  left foot osteomyelitis  POST-OPERATIVE DIAGNOSIS:  left foot osteomyelitis  PROCEDURE:  Procedure(s): AMPUTATION FITH RAY (Left)  SURGEON:  Surgeon(s) and Role:    Felecia Shelling, DPM - Primary  PHYSICIAN ASSISTANT:   ASSISTANTS: none   ANESTHESIA:   general  EBL:  50mL   BLOOD ADMINISTERED:none  DRAINS: none   LOCAL MEDICATIONS USED:  LIDOCAINE  and Amount: 10 ml  SPECIMEN:  No Specimen  DISPOSITION OF SPECIMEN:  N/A  COUNTS:  YES  TOURNIQUET:  * No tourniquets in log *  DICTATION: .Dragon Dictation  PLAN OF CARE: Admit to inpatient   PATIENT DISPOSITION:  PACU - hemodynamically stable.   Delay start of Pharmacological VTE agent (>24hrs) due to surgical blood loss or risk of bleeding: no  Felecia Shelling, DPM [facility]  Dr. Felecia Shelling, DPM    [address] [place], [place] [address]                Office [phone]  Fax [phone]

## 2022-11-15 NOTE — Progress Notes (Addendum)
PROGRESS NOTE    Kenneth Bailey  ZOX:[id number] DOB: 29-Dec-1965 DOA: 10/30/2022 PCP: [name], MD   Brief Narrative:  The patient is a 57 year old chronically ill-appearing Caucasian male with a past medical history significant for but not limited to uncontrolled diabetes mellitus type 2, CAD status post CABG, chronic combined systolic and diastolic CHF, tobacco abuse as well as other comorbidities who presented to [facility] emergency department at the recommendation of his podiatrist given his persistent left foot pain and concern for osteomyelitis of the left fifth toe.  He has been suffering with a left foot diabetic ulcer for approximately 4 weeks and will prescribe a course of antibiotics through the hospital hospital ED and was referred to podiatry who did follow him for approximate 1 week.  Imaging was done and the podiatrist evaluated and there is finding concerns for osteomyelitis.  Patient was contacted and instructed to go to [name], ED.  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    He was taken to the OR and is status post left foot incision and drainage with resection of fifth metatarsal head on 6/11 with cultures currently growing MRSA.  ID has been consulted for further evaluation recommendations. Given proximal incision necrosis, Podiatry recommends Vascular Consult for evaluation for wound healing and Vascular consulted and recommending obtaining an angiogram of the left lower leg with possible intervention through transfemoral access given that they suspect that he has tibial disease.  Patient was taken to the Cath Lab 11/03/22 and he underwent a left posterior tibial artery and tibioperoneal trunk angioplasty and was placed on aspirin and Plavix but no statin given his  allergy.  Vascular surgery feels that he is optimized from their standpoint.  Patient has been complicated by hematemesis and melanotic stools.  He was evaluated by GI and went under EGD and was found to have reflux esophagitis as well as a nonbleeding gastric ulcer with a clean base and a nonbleeding duodenal ulcer with flat pigmented spot biopsies were taken.  He is continue on PPI therapy and is recommended that he takes it 40 mg twice daily.  He was typed and screened and transfused 3 units of PRBCs.  He had a repeat I&D of his left foot and washout done on 11/08/2022.  Subsequently patient failed to improve and repeat MRI was done on 11/13/2022 which revealed findings consistent with residual osteomyelitis.  PT OT recommending SNF at discharge but the current plan is for the patient to return to the OR on 11/15/2022 given that he continues to have left pain in his foot.   Assessment and Plan:  Left Fifth Toe and Lateral Foot Cellulitis with recurrent/residual osteomyelitis -Was initially placed on IV antibiotics with IV cefepime and vancomycin and extensive diseases recommending giving the patient Oritavancin (11/04/22) as the long acting and then have patient do additional 14 day course of Doxycycline 100mg  po bid to start however he had a repeat of I&D of his foot and was found a lot of necrotic tissues and wound cultures were repeated.  ID was reconsulted given his leukocytosis and concern for lung wound healing and the recommendation from the infectious disease team was 6 weeks of Augmentin and doxycycline -ABIs done and showed "Right: Resting right  ankle-brachial index indicates moderate right lower  extremity arterial  disease. The right toe-brachial index is abnormal. Left: Resting left ankle-brachial index indicates moderate left lower extremity arterial disease."  -MRI did note superficial ulceration at the lateral plantar aspect of the fifth metatarsal head with underlying early osteomyelitis  of the fifth metatarsal head as well as phlegmon/early abscess formation  -WBC Trend: Recent Labs  Lab 11/04/22 1730 11/05/22 0547 11/09/22 0120 11/10/22 0138 11/11/22 0136 11/12/22 0153 11/13/22 0704 11/14/22 0800 11/15/22 0048  WBC  --    < > 19.7* 14.6* 16.4* 12.7* 11.7* 10.3 11.8*  LATICACIDVEN 1.3  --   --   --   --   --   --   --   --    < > = values in this interval not displayed.  -The patient is s/p left foot I&D with resection of fifth metatarsal head 6/11 in OR with repeat I&D on 11/08/2022 and is going back to the OR on 11/15/2022 given that he has acute residual osteomyelitis involving the remaining fifth metatarsal and fifth ray and he is going for a revisional fifth ray amputation -Initial blood Cx x2 showing NGTD at 5 Days and repeat Blood Cx 11/04/22 showed NGTD at 5 Days -Initial Wound Cx obtained and showing: Gram Stain NO WBC SEEN RARE GRAM POSITIVE COCCI IN PAIRS RARE GRAM NEGATIVE RODS  Culture FEW METHICILLIN RESISTANT STAPHYLOCOCCUS AUREUS NO ANAEROBES ISOLATED Performed at [facility], [address], [place], [place] [address]  Report Status 11/05/2022 FINAL  Organism ID, Bacteria METHICILLIN RESISTANT STAPHYLOCOCCUS AUREUS  Resulting Agency [facility]     Susceptibility   Methicillin resistant staphylococcus aureus    MIC    CIPROFLOXACIN >=8 RESISTANT Resistant    CLINDAMYCIN >=8 RESISTANT Resistant    ERYTHROMYCIN >=8 RESISTANT Resistant    GENTAMICIN <=0.5 SENSI... Sensitive    Inducible Clindamycin NEGATIVE Sensitive    LINEZOLID 2 SENSITIVE Sensitive    OXACILLIN >=4 RESISTANT Resistant    RIFAMPIN <=0.5 SENSI... Sensitive    TETRACYCLINE <=1 SENSITIVE Sensitive    TRIMETH/SULFA 160 RESISTANT Resistant    VANCOMYCIN <=0.5 SENSI... Sensitive         -Repeat Wound Cx done and showing: Gram Stain NO WBC SEEN NO ORGANISMS SEEN  Culture RARE METHICILLIN RESISTANT STAPHYLOCOCCUS AUREUS RARE ENTEROCOCCUS FAECALIS CRITICAL RESULT  CALLED TO, READ BACK BY AND VERIFIED WITH: Alene Mires [id number] AT 1232 BY EC NO ANAEROBES ISOLATED Performed at [facility], [address], [place], [place] [address]  Report Status 11/13/2022 FINAL  Organism ID, Bacteria METHICILLIN RESISTANT STAPHYLOCOCCUS AUREUS  Organism ID, Bacteria ENTEROCOCCUS FAECALIS  Resulting Agency [facility]     Susceptibility    Methicillin resistant staphylococcus aureus Enterococcus faecalis   MIC MIC   AMPICILLIN   <=2 SENSITIVE Sensitive   CIPROFLOXACIN >=8 RESISTANT Resistant     CLINDAMYCIN >=8 RESISTANT Resistant     ERYTHROMYCIN >=8 RESISTANT Resistant     GENTAMICIN <=0.5 SENSI... Sensitive     GENTAMICIN SYNERGY   SENSITIVE Sensitive   Inducible Clindamycin NEGATIVE Sensitive     LINEZOLID 2 SENSITIVE Sensitive     OXACILLIN >=4 RESISTANT Resistant     RIFAMPIN <=0.5 SENSI... Sensitive     TETRACYCLINE <=1 SENSITIVE Sensitive     TRIMETH/SULFA >=320 RESIS... Resistant     VANCOMYCIN 1 SENSITIVE Sensitive 2 SENSITIVE Sensitive         -Continue with po Oxycodone 5 mg po q6hprn Moderate/Breakthrough Pain and IV Hydromorphone 0.5 mg IV q4hprn Severe  Pain  -Vascular did an Abdominal aortogram with left lower extremity runoff  and he had a left posterior tibial artery and tibioperoneal trunk angioplasty and was placed on Plavix -Vascular feels that the patient has inline flow down the left lower extremity to the posterior tibial artery retrograde after his and feels that he is optimized from a vascular standpoint and recommending Aspirin Plavix however this is now been stopped given his GI bleeding,: GI recommended dual antiplatelet therapy can be resumed on both aspirin and Plavix have been resumed -PT/OT initially recommended home health but patient needs further PT evaluation and did not participate given his pain has had repeat procedures with the I&D and now going back to the OR and now PT OT is recommending SNF   Acute Kidney  Injury, Renal Fxn Fluctuating  Metabolic Acidosis, improved -Likely prerenal in setting of infection with poor intake -Baseline creatinine approximately 1.3 with creatinine 2.9 at presentation  -BUN/Cr Trend: Recent Labs  Lab 11/08/22 0109 11/09/22 0120 11/10/22 0138 11/12/22 0153 11/13/22 0704 11/14/22 0800 11/15/22 0048  BUN 24* 32* 33* 26* 34* 31* 32*  CREATININE 1.22 1.53* 1.23 1.02 1.31* 1.17 1.32*  -Has a CO2 of 23, anion gap of 11, chloride level of 23 -IVF now stopped -Avoid Nephrotoxic Medications, Contrast Dyes, Hypotension and Dehydration to Ensure Adequate Renal Perfusion and will need to Renally Adjust Meds -Continue to Monitor and Trend Renal Function carefully and repeat CMP in the AM   Chronic Combined Systolic and Diastolic CHF -Euvolemic/somewhat dehydrated at presentation -follow with gentle volume expansion  Intake/Output Summary (Last 24 hours) at 11/15/2022 1633 Last data filed at 11/15/2022 0831 Gross per 24 hour  Intake 480 ml  Output --  Net 480 ml  -Strict I's and O's and Daily Weights -Patient is status post 3 units of PRBCs -Patient is -7.759 L since admission -Continue to Monitor for Signs and Symptoms of Volume Overload now that he is getting blood again  Syncopal Episode in the setting of ABLA from Ulcers -In the Setting of GI bleeding and patient passed out after coming back from the bathroom and was lowered to the floor by nursing staff -Was placed on a Protonix drip and received 3 units of PRBCs and 1 L bolus when this happened -Continue with progressive care monitoring and will need PT OT to further evaluate and treat now that he is more stable -Will need repeat orthostatics in the a.m. prior to discharge  Nausea and Vomiting, improved -In the setting of Abx and now GI Bleeding -Start IV Zofran and IV Compazine   Tobacco Abuse -Has been counseled on the absolute need to discontinue Tobacco completely -Continue with nicotine patch 14 mg  transdermal every 24 hours  PAD -Seen by vascular surgery and he is status post abdominal aortogram with the left posterior tibial artery and to rule peroneal trunk angioplasty -Continue with aspirin, Plavix once a day and open  HLD -C/w Gemfibrozil 600 mg po BID  -Repeat panel done and showed a total cholesterol/HDL ratio 7.0, cholesterol level 140, HDL 21, LDL 76, triglycerides 253, VLDL 51   CAD status post CABG -Asymptomatic at present -Initially was holding aspirin 81 mg p.o. daily as well as now clopidogrel 75 mg p.o. daily given GI bleeding the day before yesterday and is found to have gastric ulcers and duodenal ulcer as well as LA grade D reflux esophagitis with no bleeding: Now back on aspirin and Plavix -Not on a Statin due to Adverse Effects/Allergies  Uncontrolled DM2 -CBG controlled as an inpatient  -Recent A1c was 10.7 -C/w Diabetic education and adjustments -Will increase Semglee to 18 units twice daily and will also add 3 units of NovoLog 3 times daily meal coverage -Continue to Monitor CBGs per protocol; CBG Trend: Recent Labs  Lab 11/13/22 2138 11/14/22 0624 11/14/22 1109 11/14/22 1558 11/14/22 2056 11/15/22 0733 11/15/22 1221  GLUCAP 194* 182* 240* 151* 328* 237* 169*   Normocytic Anemia and now ABLA in the setting of upper GI bleed with LA grade D reflux esophagitis and nonbleeding gastric ulcers with a clean base and a nonbleeding duodenal ulcer with a flat pigmented spot with Hematemesis and Melena -Hgb/Hct Trend: Recent Labs  Lab 11/09/22 0120 11/10/22 0138 11/11/22 0136 11/12/22 0153 11/13/22 0704 11/14/22 0800 11/15/22 0048  HGB 9.6* 7.5* 8.5* 8.6* 9.0* 8.2* 8.5*  HCT 29.5* 22.8* 24.9* 25.7* 26.8* 25.2* 26.0*  MCV 88.1 85.4 85.6 85.7 87.6 85.4 85.8  -Checked Anemia Panel and showed an iron level of 17, UIBC of 299, TIBC 360, saturation ratios 5%, ferritin 193, folate 9.5, vitamin B12 165 -Initiated on iron supplementation but this will not be  stopped given his dark emesis and melanotic stools -Start IV PPI and type and screen and transfuse 2 units of PRBCs given that hemoglobin has now dropped to 7 -He was FOBT positive but has not had any further GI bleeding or episodes of hematochezia or melena -That is post 3 units of PRBCs were transfused -Held his aspirin and Plavix but GI recommending that spring can be resumed 11/06/2022 and Plavix can be resumed on 618 07/03/2022 -Continue to Monitor for S/Sx of Bleeding; No overt bleeding noted now -Repeat CBC in the AM; GI signed off the case and recommending calling back and there is any questions or concerns and recommending continuing PPI twice daily.  Patient has no further emesis or abdominal discomfort  Thrombocytosis -Setting of above -Continue monitor platelet count Trend Recent Labs  Lab 11/09/22 0120 11/10/22 0138 11/11/22 0136 11/12/22 0153 11/13/22 0704 11/14/22 0800 11/15/22 0048  PLT 336 298 365 374 443* 444* 461*  -Repeat CBC in the AM  Hyponatremia -Na+ Trend: Recent Labs  Lab 11/08/22 0109 11/09/22 0120 11/10/22 0138 11/12/22 0153 11/13/22 0704 11/14/22 0800 11/15/22 0048  NA 130* 129* 131* 130* 133* 133* 130*  -Continue to Monitor and Trend and Repeat CMP in the AM  GERD/GI prophylaxis/gastric ulcer and duodenal ulcer -Continue with twice daily PPI -Gastroenterology was following and now signed off  Hypoalbuminemia -Patient's Albumin Trend: Recent Labs  Lab 11/04/22 0123 11/04/22 1450 11/05/22 0547 11/06/22 0153 11/07/22 0136 11/08/22 0109 11/12/22 0153  ALBUMIN 2.4* 2.4* 2.2* 2.0* 2.5* 2.5* 2.6*  -Continue to Monitor and Trend and repeat CMP in the AM  Tobacco Abuse -Continue with Nicotine Patch 14 mg TD -Smoking cessation counseling given  Generalized deconditioning and physical debility -PT initially recommended home health but now they are recommending SNF at discharge given that patient lives with his mother   DVT prophylaxis:  SCDs Start: 10/30/22 2211    Code Status: Full Code Family Communication: No Family currently at bedside  Disposition Plan:  Level of care: Telemetry Surgical Status is: Inpatient Remains inpatient appropriate because:    Consultants:  Vascular Surgery ID Gastroenterology Podiatry   Procedures:  EGD Angioplasty IRRIGATION AND DEBRIDEMENT OF FOOT AND WASHOUT (Left) DOS: 11/08/2022   Antimicrobials:  Anti-infectives (From admission, onward)    Start     Dose/Rate Route Frequency Ordered Stop  11/11/22 1000  doxycycline (VIBRA-TABS) tablet 100 mg  Status:  Discontinued        100 mg Oral Every 12 hours 11/03/22 1616 11/10/22 0800   11/11/22 0000  doxycycline (VIBRAMYCIN) 100 MG capsule  Status:  Discontinued        100 mg Oral 2 times daily 11/07/22 1624 11/09/22    11/10/22 0800  doxycycline (VIBRA-TABS) tablet 100 mg        100 mg Oral Every 12 hours 11/10/22 0800 12/22/22 0959   11/09/22 1045  amoxicillin-clavulanate (AUGMENTIN) 875-125 MG per tablet 1 tablet        1 tablet Oral Every 12 hours 11/09/22 0957 12/21/22 0959   11/09/22 0000  doxycycline (VIBRAMYCIN) 100 MG capsule        100 mg Oral 2 times daily 11/09/22 1424 12/21/22 2359   11/09/22 0000  amoxicillin-clavulanate (AUGMENTIN) 875-125 MG tablet        1 tablet Oral 2 times daily 11/09/22 1424 12/21/22 2359   11/04/22 0800  Oritavancin Diphosphate (ORBACTIV) 1,200 mg in dextrose 5 % IVPB        1,200 mg 333.3 mL/hr over 180 Minutes Intravenous Once 11/03/22 1611 11/04/22 1158   11/03/22 0500  vancomycin (VANCOREADY) IVPB 1500 mg/300 mL  Status:  Discontinued        1,500 mg 150 mL/hr over 120 Minutes Intravenous Every 24 hours 11/02/22 1439 11/03/22 1611   11/02/22 1445  ceFEPIme (MAXIPIME) 2 g in sodium chloride 0.9 % 100 mL IVPB  Status:  Discontinued        2 g 200 mL/hr over 30 Minutes Intravenous Every 8 hours 11/02/22 1437 11/03/22 1430   11/02/22 0500  vancomycin (VANCOREADY) IVPB 1250 mg/250 mL   Status:  Discontinued        1,250 mg 166.7 mL/hr over 90 Minutes Intravenous Every 24 hours 11/01/22 0721 11/02/22 1439   11/01/22 0430  vancomycin (VANCOCIN) IVPB 1000 mg/200 mL premix        1,000 mg 200 mL/hr over 60 Minutes Intravenous  Once 11/01/22 0339 11/01/22 0620   11/01/22 0430  ceFEPIme (MAXIPIME) 2 g in sodium chloride 0.9 % 100 mL IVPB  Status:  Discontinued        2 g 200 mL/hr over 30 Minutes Intravenous Every 12 hours 11/01/22 0342 11/02/22 1437   11/01/22 0400  ceFEPIme (MAXIPIME) 2 g in sodium chloride 0.9 % 100 mL IVPB  Status:  Discontinued        2 g 200 mL/hr over 30 Minutes Intravenous Every 24 hours 10/31/22 0342 11/01/22 0341   10/31/22 0430  ceFEPIme (MAXIPIME) 2 g in sodium chloride 0.9 % 100 mL IVPB        2 g 200 mL/hr over 30 Minutes Intravenous  Once 10/31/22 0342 10/31/22 0434   10/30/22 2138  vancomycin variable dose per unstable renal function (pharmacist dosing)  Status:  Discontinued         Does not apply See admin instructions 10/30/22 2138 11/01/22 0721   10/30/22 1745  vancomycin (VANCOREADY) IVPB 1500 mg/300 mL        1,500 mg 150 mL/hr over 120 Minutes Intravenous  Once 10/30/22 1743 10/30/22 2058       Subjective: Seen and examined at bedside he was resting and going to surgery later this evening.  Denied any chest pain or shortness of breath.  Denies any lightheadedness or dizziness.  No other concerns or complaints at this time.  Objective: Vitals:  11/15/22 0435 11/15/22 0435 11/15/22 0732 11/15/22 1609  BP:  126/69 127/65 (!) 149/77  Pulse:  62 69 75  Resp:   17 18  Temp:  98.6 F (37 C) 98.2 F (36.8 C) 98.1 F (36.7 C)  TempSrc:   Oral Oral  SpO2:  99% 99% 100%  Weight: 74.5 kg     Height:        Intake/Output Summary (Last 24 hours) at 11/15/2022 1648 Last data filed at 11/15/2022 0831 Gross per 24 hour  Intake 480 ml  Output --  Net 480 ml   Filed Weights   11/12/22 0440 11/13/22 0420 11/15/22 0435  Weight: 72.8 kg  72.5 kg 74.5 kg   Examination: Physical Exam:  Constitutional: WN/WD Caucasian male who is resting and sleeping and still complaining of some foot pain Respiratory: Diminished to auscultation bilaterally, no wheezing, rales, rhonchi or crackles. Normal respiratory effort and patient is not tachypenic. No accessory muscle use.  Unlabored breathing Cardiovascular: RRR, no murmurs / rubs / gallops. S1 and S2 auscultated. No extremity edema.  Abdomen: Soft, non-tender, non-distended. Bowel sounds positive.  GU: Deferred. Musculoskeletal: No clubbing / cyanosis of digits/nails. No joint deformity upper and lower extremities.   Skin: Left foot is still wrapped Neurologic: CN 2-12 grossly intact with no focal deficits. Romberg sign and cerebellar reflexes not assessed.  Psychiatric: Normal judgment and insight. Alert and oriented x 3. Normal mood and appropriate affect.   Data Reviewed: I have personally reviewed following labs and imaging studies  CBC: Recent Labs  Lab 11/11/22 0136 11/12/22 0153 11/13/22 0704 11/14/22 0800 11/15/22 0048  WBC 16.4* 12.7* 11.7* 10.3 11.8*  NEUTROABS  --   --   --  7.2 7.9*  HGB 8.5* 8.6* 9.0* 8.2* 8.5*  HCT 24.9* 25.7* 26.8* 25.2* 26.0*  MCV 85.6 85.7 87.6 85.4 85.8  PLT 365 374 443* 444* 461*   Basic Metabolic Panel: Recent Labs  Lab 11/10/22 0138 11/12/22 0153 11/13/22 0704 11/14/22 0800 11/15/22 0048  NA 131* 130* 133* 133* 130*  K 4.5 4.5 4.5 4.5 4.3  CL 95* 96* 98 99 96*  CO2 23 23 24 24 23   GLUCOSE 218* 241* 213* 189* 273*  BUN 33* 26* 34* 31* 32*  CREATININE 1.23 1.02 1.31* 1.17 1.32*  CALCIUM 8.8* 9.4 9.6 9.3 9.0  MG  --   --  2.0  --   --   PHOS  --   --  3.8  --   --    GFR: Estimated Creatinine Clearance: 61.2 mL/min (A) (by C-G formula based on SCr of 1.32 mg/dL (H)). Liver Function Tests: Recent Labs  Lab 11/12/22 0153  AST 18  ALT 18  ALKPHOS 105  BILITOT 0.4  PROT 6.9  ALBUMIN 2.6*   No results for input(s):  "LIPASE", "AMYLASE" in the last 168 hours. No results for input(s): "AMMONIA" in the last 168 hours. Coagulation Profile: No results for input(s): "INR", "PROTIME" in the last 168 hours. Cardiac Enzymes: No results for input(s): "CKTOTAL", "CKMB", "CKMBINDEX", "TROPONINI" in the last 168 hours. BNP (last 3 results) No results for input(s): "PROBNP" in the last 8760 hours. HbA1C: No results for input(s): "HGBA1C" in the last 72 hours. CBG: Recent Labs  Lab 11/14/22 1558 11/14/22 2056 11/15/22 0733 11/15/22 1221 11/15/22 1636  GLUCAP 151* 328* 237* 169* 107*   Lipid Profile: No results for input(s): "CHOL", "HDL", "LDLCALC", "TRIG", "CHOLHDL", "LDLDIRECT" in the last 72 hours. Thyroid Function Tests: No results for  input(s): "TSH", "T4TOTAL", "FREET4", "T3FREE", "THYROIDAB" in the last 72 hours. Anemia Panel: No results for input(s): "VITAMINB12", "FOLATE", "FERRITIN", "TIBC", "IRON", "RETICCTPCT" in the last 72 hours. Sepsis Labs: No results for input(s): "PROCALCITON", "LATICACIDVEN" in the last 168 hours.  Recent Results (from the past 240 hour(s))  Aerobic/Anaerobic Culture w Gram Stain (surgical/deep wound)     Status: None   Collection Time: 11/08/22  6:22 PM   Specimen: Foot, Left; Tissue  Result Value Ref Range Status   Specimen Description ABSCESS LEFT FOOT  Final   Special Requests SWABS SAMPLE A  Final   Gram Stain   Final    ABUNDANT WBC PRESENT, PREDOMINANTLY PMN RARE GRAM POSITIVE COCCI    Culture   Final    MODERATE METHICILLIN RESISTANT STAPHYLOCOCCUS AUREUS NO ANAEROBES ISOLATED Performed at [facility], [address] [place], [place] [address]    Report Status 11/13/2022 FINAL  Final   Organism ID, Bacteria METHICILLIN RESISTANT STAPHYLOCOCCUS AUREUS  Final      Susceptibility   Methicillin resistant staphylococcus aureus - MIC*    CIPROFLOXACIN >=8 RESISTANT Resistant     ERYTHROMYCIN >=8 RESISTANT Resistant     GENTAMICIN <=0.5 SENSITIVE  Sensitive     OXACILLIN >=4 RESISTANT Resistant     TETRACYCLINE <=1 SENSITIVE Sensitive     VANCOMYCIN 1 SENSITIVE Sensitive     TRIMETH/SULFA >=320 RESISTANT Resistant     CLINDAMYCIN >=8 RESISTANT Resistant     RIFAMPIN <=0.5 SENSITIVE Sensitive     Inducible Clindamycin NEGATIVE Sensitive     LINEZOLID 2 SENSITIVE Sensitive     * MODERATE METHICILLIN RESISTANT STAPHYLOCOCCUS AUREUS  Aerobic/Anaerobic Culture w Gram Stain (surgical/deep wound)     Status: None   Collection Time: 11/08/22  6:23 PM   Specimen: Foot, Left; Tissue  Result Value Ref Range Status   Specimen Description BONE  Final   Special Requests 5TH  METATARSAL  Final   Gram Stain NO WBC SEEN NO ORGANISMS SEEN   Final   Culture   Final    RARE METHICILLIN RESISTANT STAPHYLOCOCCUS AUREUS RARE ENTEROCOCCUS FAECALIS CRITICAL RESULT CALLED TO, READ BACK BY AND VERIFIED WITH: Alene Mires [id number] AT 1232 BY EC NO ANAEROBES ISOLATED Performed at [facility], [address], [place], [place] [address]    Report Status 11/13/2022 FINAL  Final   Organism ID, Bacteria METHICILLIN RESISTANT STAPHYLOCOCCUS AUREUS  Final   Organism ID, Bacteria ENTEROCOCCUS FAECALIS  Final      Susceptibility   Enterococcus faecalis - MIC*    AMPICILLIN <=2 SENSITIVE Sensitive     VANCOMYCIN 2 SENSITIVE Sensitive     GENTAMICIN SYNERGY SENSITIVE Sensitive     * RARE ENTEROCOCCUS FAECALIS   Methicillin resistant staphylococcus aureus - MIC*    CIPROFLOXACIN >=8 RESISTANT Resistant     ERYTHROMYCIN >=8 RESISTANT Resistant     GENTAMICIN <=0.5 SENSITIVE Sensitive     OXACILLIN >=4 RESISTANT Resistant     TETRACYCLINE <=1 SENSITIVE Sensitive     VANCOMYCIN 1 SENSITIVE Sensitive     TRIMETH/SULFA >=320 RESISTANT Resistant     CLINDAMYCIN >=8 RESISTANT Resistant     RIFAMPIN <=0.5 SENSITIVE Sensitive     Inducible Clindamycin NEGATIVE Sensitive     LINEZOLID 2 SENSITIVE Sensitive     * RARE METHICILLIN RESISTANT STAPHYLOCOCCUS  AUREUS    Radiology Studies: No results found.  Scheduled Meds:  amoxicillin-clavulanate  1 tablet Oral Q12H   aspirin EC  81 mg Oral  Daily   clopidogrel  75 mg Oral Daily   doxycycline  100 mg Oral Q12H   ezetimibe  10 mg Oral Daily   gemfibrozil  600 mg Oral BID AC   insulin aspart  0-15 Units Subcutaneous TID WC   insulin aspart  0-5 Units Subcutaneous QHS   insulin aspart  3 Units Subcutaneous TID WC   insulin glargine-yfgn  18 Units Subcutaneous BID   melatonin  10 mg Oral QHS   nicotine  14 mg Transdermal Daily   pantoprazole  40 mg Oral BID   sodium chloride flush  3 mL Intravenous Q12H   Continuous Infusions:  sodium chloride      LOS: 16 days   Marguerita Merles, DO [facility] Available via Epic secure chat 7am-7pm After these hours, please refer to coverage provider listed on [email or web address] 11/15/2022, 4:48 PM

## 2022-11-16 ENCOUNTER — Encounter (HOSPITAL_COMMUNITY): Payer: Self-pay | Admitting: Podiatry

## 2022-11-16 ENCOUNTER — Inpatient Hospital Stay (HOSPITAL_COMMUNITY): Payer: BC Managed Care – PPO

## 2022-11-16 DIAGNOSIS — K269 Duodenal ulcer, unspecified as acute or chronic, without hemorrhage or perforation: Secondary | ICD-10-CM | POA: Diagnosis not present

## 2022-11-16 DIAGNOSIS — L02612 Cutaneous abscess of left foot: Secondary | ICD-10-CM | POA: Diagnosis not present

## 2022-11-16 DIAGNOSIS — K253 Acute gastric ulcer without hemorrhage or perforation: Secondary | ICD-10-CM | POA: Diagnosis not present

## 2022-11-16 DIAGNOSIS — M86172 Other acute osteomyelitis, left ankle and foot: Secondary | ICD-10-CM | POA: Diagnosis not present

## 2022-11-16 LAB — CBC WITH DIFFERENTIAL/PLATELET
Abs Immature Granulocytes: 0.21 10*3/uL — ABNORMAL HIGH (ref 0.00–0.07)
Basophils Absolute: 0.1 10*3/uL (ref 0.0–0.1)
Basophils Relative: 1 %
Eosinophils Absolute: 0.1 10*3/uL (ref 0.0–0.5)
Eosinophils Relative: 0 %
HCT: 25.1 % — ABNORMAL LOW (ref 39.0–52.0)
Hemoglobin: 8.3 g/dL — ABNORMAL LOW (ref 13.0–17.0)
Immature Granulocytes: 2 %
Lymphocytes Relative: 11 %
Lymphs Abs: 1.2 10*3/uL (ref 0.7–4.0)
MCH: 28.2 pg (ref 26.0–34.0)
MCHC: 33.1 g/dL (ref 30.0–36.0)
MCV: 85.4 fL (ref 80.0–100.0)
Monocytes Absolute: 0.4 10*3/uL (ref 0.1–1.0)
Monocytes Relative: 3 %
Neutro Abs: 9.8 10*3/uL — ABNORMAL HIGH (ref 1.7–7.7)
Neutrophils Relative %: 83 %
Platelets: 430 10*3/uL — ABNORMAL HIGH (ref 150–400)
RBC: 2.94 MIL/uL — ABNORMAL LOW (ref 4.22–5.81)
RDW: 14.2 % (ref 11.5–15.5)
WBC: 11.8 10*3/uL — ABNORMAL HIGH (ref 4.0–10.5)
nRBC: 0 % (ref 0.0–0.2)

## 2022-11-16 LAB — COMPREHENSIVE METABOLIC PANEL
ALT: 12 U/L (ref 0–44)
AST: 16 U/L (ref 15–41)
Albumin: 2.8 g/dL — ABNORMAL LOW (ref 3.5–5.0)
Alkaline Phosphatase: 93 U/L (ref 38–126)
Anion gap: 9 (ref 5–15)
BUN: 31 mg/dL — ABNORMAL HIGH (ref 6–20)
CO2: 25 mmol/L (ref 22–32)
Calcium: 9.4 mg/dL (ref 8.9–10.3)
Chloride: 97 mmol/L — ABNORMAL LOW (ref 98–111)
Creatinine, Ser: 1.21 mg/dL (ref 0.61–1.24)
GFR, Estimated: >60 mL/min (ref >60)
Glucose, Bld: 247 mg/dL — ABNORMAL HIGH (ref 70–99)
Potassium: 5.3 mmol/L — ABNORMAL HIGH (ref 3.5–5.1)
Sodium: 131 mmol/L — ABNORMAL LOW (ref 135–145)
Total Bilirubin: 0.3 mg/dL (ref 0.3–1.2)
Total Protein: 7.1 g/dL (ref 6.5–8.1)

## 2022-11-16 LAB — PHOSPHORUS: Phosphorus: 4.9 mg/dL — ABNORMAL HIGH (ref 2.5–4.6)

## 2022-11-16 LAB — MAGNESIUM: Magnesium: 1.9 mg/dL (ref 1.7–2.4)

## 2022-11-16 LAB — GLUCOSE, CAPILLARY
Glucose-Capillary: 265 mg/dL — ABNORMAL HIGH (ref 70–99)
Glucose-Capillary: 184 mg/dL — ABNORMAL HIGH (ref 70–99)
Glucose-Capillary: 207 mg/dL — ABNORMAL HIGH (ref 70–99)
Glucose-Capillary: 206 mg/dL — ABNORMAL HIGH (ref 70–99)
Glucose-Capillary: 215 mg/dL — ABNORMAL HIGH (ref 70–99)

## 2022-11-16 MED ORDER — SODIUM ZIRCONIUM CYCLOSILICATE 10 G PO PACK
10.0000 g | PACK | Freq: Once | ORAL | Status: AC
  Administered 2022-11-16: 10 g via ORAL
  Filled 2022-11-16: qty 1

## 2022-11-16 NOTE — Progress Notes (Signed)
   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  Seen at bedside.  Continues to have pain associated to the foot.  Dressings are clean dry and intact.  Weightbearing status modified to WBAT heel pressure only.  Objective: Vital signs in last 24 hours: Temp:  [97.8 F (36.6 C)-98.2 F (36.8 C)] 98.2 F (36.8 C) (06/27 1701) Pulse Rate:  [71-78] 74 (06/27 1701) Resp:  [16-24] 17 (06/27 1701) BP: (119-159)/(60-73) 130/72 (06/27 1701) SpO2:  [98 %-100 %] 100 % (06/27 1701) Weight:  [74.6 kg] 74.6 kg (06/27 0500)  Recent Labs    11/14/22 0800 11/15/22 0048 11/16/22 0433  HGB 8.2* 8.5* 8.3*   Recent Labs    11/15/22 0048 11/16/22 0433  WBC 11.8* 11.8*  RBC 3.03* 2.94*  HCT 26.0* 25.1*  PLT 461* 430*   Recent Labs    11/15/22 0048 11/16/22 0433  NA 130* 131*  K 4.3 5.3*  CL 96* 97*  CO2 23 25  BUN 32* 31*  CREATININE 1.32* 1.21  GLUCOSE 273* 247*  CALCIUM 9.0 9.4   No results for input(s): "LABPT", "INR" in the last 72 hours.   LT foot 11/16/2022.  POV #1  Physical Exam: Primary closure of the entire incision achieved.  No active bleeding.  Staples intact.  Stable amputation site.  Assessment/Plan: 1 Day Post-Op Procedure(s) (LRB): AMPUTATION FITH RAY (Left) DOS: 11/15/2022  -Patient seen at bedside this evening.  Amputation site stable.  No dehiscence or draining sinus postoperatively. -Dressings changed.  Leave clean dry and intact until f/u in office.  -WBAT heel pressure only.  Patient states that with physical therapy he was able to ambulate up and down the hallways.  Ultimately up to PT, however from a Podiatry standpoint okay for WBAT heel pressure and should be okay for DC to home.  -ABX as per ID recommendations doxycycline and Augmentin x 6 weeks -Patient does have pain associated to the surgical foot.   Recommend DC on Percocet 5/325 mg Q4H PRN pain -From a podiatry standpoint patient okay for discharge. F/u in office one week.  -Will sign off  Felecia Shelling 11/16/2022, 6:24 PM  Felecia Shelling, DPM [facility]  Dr. Felecia Shelling, DPM    [address] [place], [place] [address]                Office [phone]  Fax [phone]

## 2022-11-16 NOTE — Anesthesia Postprocedure Evaluation (Signed)
Anesthesia Post Note  Patient: Kenneth Bailey  Procedure(s) Performed: AMPUTATION FITH RAY (Left)     Patient location during evaluation: PACU Anesthesia Type: General Level of consciousness: sedated and patient cooperative Pain management: pain level controlled Vital Signs Assessment: post-procedure vital signs reviewed and stable Respiratory status: spontaneous breathing Cardiovascular status: stable Anesthetic complications: no   No notable events documented.  Last Vitals:  Vitals:   11/16/22 1701 11/16/22 2029  BP: 130/72 134/67  Pulse: 74 77  Resp: 17 18  Temp: 36.8 C 36.9 C  SpO2: 100% 98%    Last Pain:  Vitals:   11/16/22 2029  TempSrc: Oral  PainSc:                  [name]

## 2022-11-16 NOTE — Progress Notes (Signed)
PROGRESS NOTE    Kenneth Bailey  GUY:[id number] DOB: Aug 10, 1965 DOA: 10/30/2022 PCP: [name], MD   Brief Narrative:  The patient is a 57 year old chronically ill-appearing Caucasian male with a past medical history significant for but not limited to uncontrolled diabetes mellitus type 2, CAD status post CABG, chronic combined systolic and diastolic CHF, tobacco abuse as well as other comorbidities who presented to [facility] emergency department at the recommendation of his podiatrist given his persistent left foot pain and concern for osteomyelitis of the left fifth toe.  He has been suffering with a left foot diabetic ulcer for approximately 4 weeks and will prescribe a course of antibiotics through the hospital hospital ED and was referred to podiatry who did follow him for approximate 1 week.  Imaging was done and the podiatrist evaluated and there is finding concerns for osteomyelitis.  Patient was contacted and instructed to go to [name] ED.  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    He was taken to the OR and is status post left foot incision and drainage with resection of fifth metatarsal head on 6/11 with cultures currently growing MRSA.  ID has been consulted for further evaluation recommendations. Given proximal incision necrosis, Podiatry recommends Vascular Consult for evaluation for wound healing and Vascular consulted and recommending obtaining an angiogram of the left lower leg with possible intervention through transfemoral access given that they suspect that he has tibial disease.  Patient was taken to the Cath Lab 11/03/22 and he underwent a left posterior tibial artery and tibioperoneal trunk angioplasty and was placed on aspirin and Plavix but no statin given his  allergy.  Vascular surgery feels that he is optimized from their standpoint.  Patient has been complicated by hematemesis and melanotic stools.  He was evaluated by GI and went under EGD and was found to have reflux esophagitis as well as a nonbleeding gastric ulcer with a clean base and a nonbleeding duodenal ulcer with flat pigmented spot biopsies were taken.  He is continue on PPI therapy and is recommended that he takes it 40 mg twice daily.  He was typed and screened and transfused 3 units of PRBCs.  He had a repeat I&D of his left foot and washout done on 11/08/2022.  Subsequently patient failed to improve and repeat MRI was done on 11/13/2022 which revealed findings consistent with residual osteomyelitis.  PT OT recommending SNF at discharge but the current plan is for the patient to return to the OR on 11/15/2022 given that he continues to have left pain in his foot.  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  ID was reconsulted given his leukocytosis and concern for lung wound healing and the recommendation from the infectious disease team was 6 weeks of Augmentin and  doxycycline -ABIs done and showed "Right: Resting right  ankle-brachial index indicates moderate right lower  extremity arterial disease. The right toe-brachial index is abnormal. Left: Resting left ankle-brachial index indicates moderate left lower extremity arterial disease."  -MRI did note  superficial ulceration at the lateral plantar aspect of the fifth metatarsal head with underlying early osteomyelitis of the fifth metatarsal head as well as phlegmon/early abscess formation  -WBC Trend: Recent Labs  Lab 11/04/22 1730 11/05/22 0547 11/10/22 0138 11/11/22 0136 11/12/22 0153 11/13/22 0704 11/14/22 0800 11/15/22 0048 11/16/22 0433  WBC  --    < > 14.6* 16.4* 12.7* 11.7* 10.3 11.8* 11.8*  LATICACIDVEN 1.3  --   --   --   --   --   --   --   --    < > = values in this interval not displayed.  -The patient is s/p left foot I&D with resection of fifth metatarsal head 6/11 in OR with repeat I&D on 11/08/2022 and is going back to the OR on 11/15/2022 given that he has acute residual osteomyelitis involving the remaining fifth metatarsal and fifth ray and he is going for a revisional fifth ray amputation -Initial blood Cx x2 showing NGTD at 5 Days and repeat Blood Cx 11/04/22 showed NGTD at 5 Days -Initial Wound Cx obtained and showing: Gram Stain NO WBC SEEN RARE GRAM POSITIVE COCCI IN PAIRS RARE GRAM NEGATIVE RODS  Culture FEW METHICILLIN RESISTANT STAPHYLOCOCCUS AUREUS NO ANAEROBES ISOLATED Performed at [facility], [address] [place], [place] [address]  Report Status 11/05/2022 FINAL  Organism ID, Bacteria METHICILLIN RESISTANT STAPHYLOCOCCUS AUREUS  Resulting Agency [facility]     Susceptibility   Methicillin resistant staphylococcus aureus    MIC    CIPROFLOXACIN >=8 RESISTANT Resistant    CLINDAMYCIN >=8 RESISTANT Resistant    ERYTHROMYCIN >=8 RESISTANT Resistant    GENTAMICIN <=0.5 SENSI... Sensitive    Inducible Clindamycin NEGATIVE Sensitive    LINEZOLID 2 SENSITIVE Sensitive    OXACILLIN >=4 RESISTANT Resistant    RIFAMPIN <=0.5 SENSI... Sensitive    TETRACYCLINE <=1 SENSITIVE Sensitive    TRIMETH/SULFA 160 RESISTANT Resistant    VANCOMYCIN <=0.5 SENSI... Sensitive         -Repeat Wound Cx done and showing: Gram Stain NO WBC SEEN NO  ORGANISMS SEEN  Culture RARE METHICILLIN RESISTANT STAPHYLOCOCCUS AUREUS RARE ENTEROCOCCUS FAECALIS CRITICAL RESULT CALLED TO, READ BACK BY AND VERIFIED WITH: Alene Mires [id number] AT 1232 BY EC NO ANAEROBES ISOLATED Performed at [facility], [address] [place], [place] [address]  Report Status 11/13/2022 FINAL  Organism ID, Bacteria METHICILLIN RESISTANT STAPHYLOCOCCUS AUREUS  Organism ID, Bacteria ENTEROCOCCUS FAECALIS  Resulting Agency [facility]     Susceptibility    Methicillin resistant staphylococcus aureus Enterococcus faecalis   MIC MIC   AMPICILLIN   <=2 SENSITIVE Sensitive   CIPROFLOXACIN >=8 RESISTANT Resistant     CLINDAMYCIN >=8 RESISTANT Resistant     ERYTHROMYCIN >=8 RESISTANT Resistant     GENTAMICIN <=0.5 SENSI... Sensitive     GENTAMICIN SYNERGY   SENSITIVE Sensitive   Inducible Clindamycin NEGATIVE Sensitive     LINEZOLID 2 SENSITIVE Sensitive     OXACILLIN >=4 RESISTANT Resistant     RIFAMPIN <=0.5 SENSI... Sensitive     TETRACYCLINE <=1 SENSITIVE Sensitive     TRIMETH/SULFA >=320 RESIS... Resistant     VANCOMYCIN 1 SENSITIVE Sensitive 2 SENSITIVE Sensitive         -  Continue with po Oxycodone 5 mg po q6hprn Moderate/Breakthrough Pain and IV Hydromorphone 0.5 mg IV q4hprn Severe Pain  -Vascular did an Abdominal aortogram with left lower extremity runoff  and he had a left posterior tibial artery and tibioperoneal trunk angioplasty and was placed on Plavix -Vascular feels that the patient has inline flow down the left lower extremity to the posterior tibial artery retrograde after his and feels that he is optimized from a vascular standpoint and recommending Aspirin Plavix however this is now been stopped given his GI bleeding,: GI recommended dual antiplatelet therapy can be resumed on both aspirin and Plavix have been resumed -PT/OT initially recommended home health but patient needs further PT evaluation and did not participate given his pain has  had repeat procedures with the I&D and now going back to the OR and went to the OR on 11/15/2022 and he is postoperative day 1 for 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 -She has now modified his weightbearing status to heel pressure only Podiatry has now cleared the patient for discharge will continue antibiotic management and will need to go to SNF for further evaluation and now the podiatrist recommends leaving the dressings in place intact until follow-up in office   Acute Kidney Injury, Renal Fxn Fluctuating  Metabolic Acidosis, improved -Likely prerenal in setting of infection with poor intake -Baseline creatinine approximately 1.3 with creatinine 2.9 at presentation  -BUN/Cr Trend: Recent Labs  Lab 11/09/22 0120 11/10/22 0138 11/12/22 0153 11/13/22 0704 11/14/22 0800 11/15/22 0048 11/16/22 0433  BUN 32* 33* 26* 34* 31* 32* 31*  CREATININE 1.53* 1.23 1.02 1.31* 1.17 1.32* 1.21  -Has a CO2 of 25, anion gap of 9, chloride level of 97 -IVF now stopped -Avoid Nephrotoxic Medications, Contrast Dyes, Hypotension and Dehydration to Ensure Adequate Renal Perfusion and will need to Renally Adjust Meds -Continue to Monitor and Trend Renal Function carefully and repeat CMP in the AM   Chronic Combined Systolic and Diastolic CHF -Euvolemic/somewhat dehydrated at presentation -follow with gentle volume expansion  Intake/Output Summary (Last 24 hours) at 11/16/2022 1927 Last data filed at 11/16/2022 1700 Gross per 24 hour  Intake 1080 ml  Output 25 ml  Net 1055 ml  -Strict I's and O's and Daily Weights -Patient is status post 3 units of PRBCs -Patient is -5.804 L since admission -Continue to Monitor for Signs and Symptoms of Volume Overload now that he is getting blood again  Syncopal Episode in the setting of ABLA from Ulcers -In the Setting of GI bleeding and patient passed out after coming back from the bathroom and  was lowered to the floor by nursing staff -Was placed on a Protonix drip and received 3 units of PRBCs and 1 L bolus when this happened -Continue with progressive care monitoring and will need PT OT to further evaluate and treat now that he is more stable -Will need repeat orthostatics  Hyperkalemia -Patient's potassium level trend: Recent Labs  Lab 11/09/22 0120 11/10/22 0138 11/12/22 0153 11/13/22 0704 11/14/22 0800 11/15/22 0048 11/16/22 0433  K 5.8* 4.5 4.5 4.5 4.5 4.3 5.3*  -Given a dose of Lokelma 10 g x 1 -To monitor and trend and repeat CMP in the a.m.  Nausea and Vomiting, improved -In the setting of Abx and now GI Bleeding -Start IV Zofran and IV Compazine   Tobacco Abuse -Has been counseled on the absolute need to discontinue Tobacco completely -Continue with nicotine  patch 14 mg transdermal every 24 hours  PAD -Seen by vascular surgery and he is status post abdominal aortogram with the left posterior tibial artery and to rule peroneal trunk angioplasty -Continue with aspirin, Plavix and currently not on a statin  HLD -C/w Gemfibrozil 600 mg po BID  -Repeat panel done and showed a total cholesterol/HDL ratio 7.0, cholesterol level 140, HDL 21, LDL 76, triglycerides 253, VLDL 51   CAD status post CABG -Asymptomatic at present -Initially was holding aspirin 81 mg p.o. daily as well as now clopidogrel 75 mg p.o. daily given GI bleeding the day before yesterday and is found to have gastric ulcers and duodenal ulcer as well as LA grade D reflux esophagitis with no bleeding: Now back on aspirin and Plavix -Not on a Statin due to Adverse Effects/Allergies    Uncontrolled DM2 -CBG controlled as an inpatient  -Recent A1c was 10.7 -C/w Diabetic education and adjustments -Will increase Semglee to 18 units twice daily and will also add 3 units of NovoLog 3 times daily meal coverage -Continue to Monitor CBGs per protocol; CBG Trend: Recent Labs  Lab 11/15/22 1636  11/15/22 1827 11/15/22 2127 11/15/22 2211 11/16/22 0814 11/16/22 1233 11/16/22 1659  GLUCAP 107* 118* 122* 143* 265* 184* 207*   Normocytic Anemia and now ABLA in the setting of upper GI bleed with LA grade D reflux esophagitis and nonbleeding gastric ulcers with a clean base and a nonbleeding duodenal ulcer with a flat pigmented spot with Hematemesis and Melena -Hgb/Hct Trend: Recent Labs  Lab 11/10/22 0138 11/11/22 0136 11/12/22 0153 11/13/22 0704 11/14/22 0800 11/15/22 0048 11/16/22 0433  HGB 7.5* 8.5* 8.6* 9.0* 8.2* 8.5* 8.3*  HCT 22.8* 24.9* 25.7* 26.8* 25.2* 26.0* 25.1*  MCV 85.4 85.6 85.7 87.6 85.4 85.8 85.4  -Checked Anemia Panel and showed an iron level of 17, UIBC of 299, TIBC 360, saturation ratios 5%, ferritin 193, folate 9.5, vitamin B12 165 -Initiated on iron supplementation but this will not be stopped given his dark emesis and melanotic stools -Start IV PPI and type and screen and transfuse 2 units of PRBCs given that hemoglobin has now dropped to 7 -He was FOBT positive but has not had any further GI bleeding or episodes of hematochezia or melena -That is post 3 units of PRBCs were transfused -Held his aspirin and Plavix but GI recommending that spring can be resumed 11/06/2022 and Plavix can be resumed on 618 07/03/2022 -Continue to Monitor for S/Sx of Bleeding; No overt bleeding noted now -Repeat CBC in the AM; GI signed off the case and recommending calling back and there is any questions or concerns and recommending continuing PPI twice daily.  Patient has no further emesis or abdominal discomfort  Thrombocytosis -Setting of above -Continue monitor platelet count Trend Recent Labs  Lab 11/10/22 0138 11/11/22 0136 11/12/22 0153 11/13/22 0704 11/14/22 0800 11/15/22 0048 11/16/22 0433  PLT 298 365 374 443* 444* 461* 430*  -Repeat CBC in the AM  Hyponatremia -Na+ Trend: Recent Labs  Lab 11/09/22 0120 11/10/22 0138 11/12/22 0153 11/13/22 0704  11/14/22 0800 11/15/22 0048 11/16/22 0433  NA 129* 131* 130* 133* 133* 130* 131*  -Continue to Monitor and Trend and Repeat CMP in the AM  GERD/GI prophylaxis/gastric ulcer and duodenal ulcer -Continue with twice daily PPI -Gastroenterology was following and now signed off  Hypoalbuminemia -Patient's Albumin Trend: Recent Labs  Lab 11/04/22 1450 11/05/22 0547 11/06/22 0153 11/07/22 0136 11/08/22 0109 11/12/22 0153 11/16/22 0433  ALBUMIN 2.4* 2.2*  2.0* 2.5* 2.5* 2.6* 2.8*  -Continue to Monitor and Trend and repeat CMP in the AM  Tobacco Abuse -Continue with Nicotine Patch 14 mg TD -Smoking cessation counseling given  Generalized deconditioning and physical debility -PT initially recommended home health but now they are recommending SNF at discharge given that patient lives with his mother   DVT prophylaxis: SCDs Start: 11/15/22 2204 SCDs Start: 10/30/22 2211    Code Status: Full Code Family Communication: Discussed with family and mother at bedside  Disposition Plan:  Level of care: Telemetry Surgical Status is: Inpatient Remains inpatient appropriate because: Need to go to SNF and needs insurance authorization and bed availability   Consultants:  Vascular Surgery ID Gastroenterology Podiatry   Procedures:  EGD Angioplasty IRRIGATION AND DEBRIDEMENT OF FOOT AND WASHOUT (Left) DOS: 11/08/2022  Procedure:  1.  Partial foot amputation left  Antimicrobials:  Anti-infectives (From admission, onward)    Start     Dose/Rate Route Frequency Ordered Stop   11/11/22 1000  doxycycline (VIBRA-TABS) tablet 100 mg  Status:  Discontinued        100 mg Oral Every 12 hours 11/03/22 1616 11/10/22 0800   11/11/22 0000  doxycycline (VIBRAMYCIN) 100 MG capsule  Status:  Discontinued        100 mg Oral 2 times daily 11/07/22 1624 11/09/22    11/10/22 0800  doxycycline (VIBRA-TABS) tablet 100 mg        100 mg Oral Every 12 hours 11/10/22 0800 12/22/22 0959   11/09/22 1045   amoxicillin-clavulanate (AUGMENTIN) 875-125 MG per tablet 1 tablet        1 tablet Oral Every 12 hours 11/09/22 0957 12/21/22 0959   11/09/22 0000  doxycycline (VIBRAMYCIN) 100 MG capsule        100 mg Oral 2 times daily 11/09/22 1424 12/21/22 2359   11/09/22 0000  amoxicillin-clavulanate (AUGMENTIN) 875-125 MG tablet        1 tablet Oral 2 times daily 11/09/22 1424 12/21/22 2359   11/04/22 0800  Oritavancin Diphosphate (ORBACTIV) 1,200 mg in dextrose 5 % IVPB        1,200 mg 333.3 mL/hr over 180 Minutes Intravenous Once 11/03/22 1611 11/04/22 1158   11/03/22 0500  vancomycin (VANCOREADY) IVPB 1500 mg/300 mL  Status:  Discontinued        1,500 mg 150 mL/hr over 120 Minutes Intravenous Every 24 hours 11/02/22 1439 11/03/22 1611   11/02/22 1445  ceFEPIme (MAXIPIME) 2 g in sodium chloride 0.9 % 100 mL IVPB  Status:  Discontinued        2 g 200 mL/hr over 30 Minutes Intravenous Every 8 hours 11/02/22 1437 11/03/22 1430   11/02/22 0500  vancomycin (VANCOREADY) IVPB 1250 mg/250 mL  Status:  Discontinued        1,250 mg 166.7 mL/hr over 90 Minutes Intravenous Every 24 hours 11/01/22 0721 11/02/22 1439   11/01/22 0430  vancomycin (VANCOCIN) IVPB 1000 mg/200 mL premix        1,000 mg 200 mL/hr over 60 Minutes Intravenous  Once 11/01/22 0339 11/01/22 0620   11/01/22 0430  ceFEPIme (MAXIPIME) 2 g in sodium chloride 0.9 % 100 mL IVPB  Status:  Discontinued        2 g 200 mL/hr over 30 Minutes Intravenous Every 12 hours 11/01/22 0342 11/02/22 1437   11/01/22 0400  ceFEPIme (MAXIPIME) 2 g in sodium chloride 0.9 % 100 mL IVPB  Status:  Discontinued        2 g 200  mL/hr over 30 Minutes Intravenous Every 24 hours 10/31/22 0342 11/01/22 0341   10/31/22 0430  ceFEPIme (MAXIPIME) 2 g in sodium chloride 0.9 % 100 mL IVPB        2 g 200 mL/hr over 30 Minutes Intravenous  Once 10/31/22 0342 10/31/22 0434   10/30/22 2138  vancomycin variable dose per unstable renal function (pharmacist dosing)  Status:   Discontinued         Does not apply See admin instructions 10/30/22 2138 11/01/22 0721   10/30/22 1745  vancomycin (VANCOREADY) IVPB 1500 mg/300 mL        1,500 mg 150 mL/hr over 120 Minutes Intravenous  Once 10/30/22 1743 10/30/22 2058       Subjective: Seen and examined at bedside and he states that his foot is still hurting quite a bit.  No nausea or vomiting.  Awaiting for the podiatrist to evaluate him today.  PT OT recommending SNF.  He had no other concerns or complaints at this time.  Objective: Vitals:   11/15/22 2208 11/16/22 0459 11/16/22 0500 11/16/22 1701  BP: 119/64 (!) 150/70  130/72  Pulse: 78 75  74  Resp:    17  Temp: 98.1 F (36.7 C) 97.8 F (36.6 C)  98.2 F (36.8 C)  TempSrc: Oral Oral    SpO2: 100% 100%  100%  Weight:   74.6 kg   Height:        Intake/Output Summary (Last 24 hours) at 11/16/2022 1931 Last data filed at 11/16/2022 1700 Gross per 24 hour  Intake 1080 ml  Output 25 ml  Net 1055 ml   Filed Weights   11/13/22 0420 11/15/22 0435 11/16/22 0500  Weight: 72.5 kg 74.5 kg 74.6 kg   Examination: Physical Exam:  Constitutional: WN/WD overweight chronically ill-appearing Caucasian male who appears slightly uncomfortable Respiratory: Diminished to auscultation bilaterally, no wheezing, rales, rhonchi or crackles. Normal respiratory effort and patient is not tachypenic. No accessory muscle use.  Unlabored breathing Cardiovascular: RRR, no murmurs / rubs / gallops. S1 and S2 auscultated. No extremity edema.  Abdomen: Soft, non-tender, slightly distended secondary to body habitus. Bowel sounds positive.  GU: Deferred. Musculoskeletal: No clubbing / cyanosis of digits/nails.  Left foot was wrapped and he has a partial amputation of his left foot.  Skin: No rashes, lesions, ulcers on limited skin evaluation. No induration; Warm and dry.  Neurologic: CN 2-12 grossly intact with no focal deficits. Romberg sign and cerebellar reflexes not assessed.   Psychiatric: Normal judgment and insight. Alert and oriented x 3. Normal mood and appropriate affect.   Data Reviewed: I have personally reviewed following labs and imaging studies  CBC: Recent Labs  Lab 11/12/22 0153 11/13/22 0704 11/14/22 0800 11/15/22 0048 11/16/22 0433  WBC 12.7* 11.7* 10.3 11.8* 11.8*  NEUTROABS  --   --  7.2 7.9* 9.8*  HGB 8.6* 9.0* 8.2* 8.5* 8.3*  HCT 25.7* 26.8* 25.2* 26.0* 25.1*  MCV 85.7 87.6 85.4 85.8 85.4  PLT 374 443* 444* 461* 430*   Basic Metabolic Panel: Recent Labs  Lab 11/12/22 0153 11/13/22 0704 11/14/22 0800 11/15/22 0048 11/16/22 0433  NA 130* 133* 133* 130* 131*  K 4.5 4.5 4.5 4.3 5.3*  CL 96* 98 99 96* 97*  CO2 23 24 24 23 25   GLUCOSE 241* 213* 189* 273* 247*  BUN 26* 34* 31* 32* 31*  CREATININE 1.02 1.31* 1.17 1.32* 1.21  CALCIUM 9.4 9.6 9.3 9.0 9.4  MG  --  2.0  --   --  1.9  PHOS  --  3.8  --   --  4.9*   GFR: Estimated Creatinine Clearance: 66.7 mL/min (by C-G formula based on SCr of 1.21 mg/dL). Liver Function Tests: Recent Labs  Lab 11/12/22 0153 11/16/22 0433  AST 18 16  ALT 18 12  ALKPHOS 105 93  BILITOT 0.4 0.3  PROT 6.9 7.1  ALBUMIN 2.6* 2.8*   No results for input(s): "LIPASE", "AMYLASE" in the last 168 hours. No results for input(s): "AMMONIA" in the last 168 hours. Coagulation Profile: No results for input(s): "INR", "PROTIME" in the last 168 hours. Cardiac Enzymes: No results for input(s): "CKTOTAL", "CKMB", "CKMBINDEX", "TROPONINI" in the last 168 hours. BNP (last 3 results) No results for input(s): "PROBNP" in the last 8760 hours. HbA1C: No results for input(s): "HGBA1C" in the last 72 hours. CBG: Recent Labs  Lab 11/15/22 2127 11/15/22 2211 11/16/22 0814 11/16/22 1233 11/16/22 1659  GLUCAP 122* 143* 265* 184* 207*   Lipid Profile: No results for input(s): "CHOL", "HDL", "LDLCALC", "TRIG", "CHOLHDL", "LDLDIRECT" in the last 72 hours. Thyroid Function Tests: No results for input(s):  "TSH", "T4TOTAL", "FREET4", "T3FREE", "THYROIDAB" in the last 72 hours. Anemia Panel: No results for input(s): "VITAMINB12", "FOLATE", "FERRITIN", "TIBC", "IRON", "RETICCTPCT" in the last 72 hours. Sepsis Labs: No results for input(s): "PROCALCITON", "LATICACIDVEN" in the last 168 hours.  Recent Results (from the past 240 hour(s))  Aerobic/Anaerobic Culture w Gram Stain (surgical/deep wound)     Status: None   Collection Time: 11/08/22  6:22 PM   Specimen: Foot, Left; Tissue  Result Value Ref Range Status   Specimen Description ABSCESS LEFT FOOT  Final   Special Requests SWABS SAMPLE A  Final   Gram Stain   Final    ABUNDANT WBC PRESENT, PREDOMINANTLY PMN RARE GRAM POSITIVE COCCI    Culture   Final    MODERATE METHICILLIN RESISTANT STAPHYLOCOCCUS AUREUS NO ANAEROBES ISOLATED Performed at [facility], [address], [place], [place] [address]    Report Status 11/13/2022 FINAL  Final   Organism ID, Bacteria METHICILLIN RESISTANT STAPHYLOCOCCUS AUREUS  Final      Susceptibility   Methicillin resistant staphylococcus aureus - MIC*    CIPROFLOXACIN >=8 RESISTANT Resistant     ERYTHROMYCIN >=8 RESISTANT Resistant     GENTAMICIN <=0.5 SENSITIVE Sensitive     OXACILLIN >=4 RESISTANT Resistant     TETRACYCLINE <=1 SENSITIVE Sensitive     VANCOMYCIN 1 SENSITIVE Sensitive     TRIMETH/SULFA >=320 RESISTANT Resistant     CLINDAMYCIN >=8 RESISTANT Resistant     RIFAMPIN <=0.5 SENSITIVE Sensitive     Inducible Clindamycin NEGATIVE Sensitive     LINEZOLID 2 SENSITIVE Sensitive     * MODERATE METHICILLIN RESISTANT STAPHYLOCOCCUS AUREUS  Aerobic/Anaerobic Culture w Gram Stain (surgical/deep wound)     Status: None   Collection Time: 11/08/22  6:23 PM   Specimen: Foot, Left; Tissue  Result Value Ref Range Status   Specimen Description BONE  Final   Special Requests 5TH  METATARSAL  Final   Gram Stain NO WBC SEEN NO ORGANISMS SEEN   Final   Culture   Final    RARE METHICILLIN  RESISTANT STAPHYLOCOCCUS AUREUS RARE ENTEROCOCCUS FAECALIS CRITICAL RESULT CALLED TO, READ BACK BY AND VERIFIED WITH: Alene Mires [id number] AT 1232 BY EC NO ANAEROBES ISOLATED Performed at [facility], [address], [place], [place] [address]    Report Status 11/13/2022 FINAL  Final   Organism ID, Bacteria  METHICILLIN RESISTANT STAPHYLOCOCCUS AUREUS  Final   Organism ID, Bacteria ENTEROCOCCUS FAECALIS  Final      Susceptibility   Enterococcus faecalis - MIC*    AMPICILLIN <=2 SENSITIVE Sensitive     VANCOMYCIN 2 SENSITIVE Sensitive     GENTAMICIN SYNERGY SENSITIVE Sensitive     * RARE ENTEROCOCCUS FAECALIS   Methicillin resistant staphylococcus aureus - MIC*    CIPROFLOXACIN >=8 RESISTANT Resistant     ERYTHROMYCIN >=8 RESISTANT Resistant     GENTAMICIN <=0.5 SENSITIVE Sensitive     OXACILLIN >=4 RESISTANT Resistant     TETRACYCLINE <=1 SENSITIVE Sensitive     VANCOMYCIN 1 SENSITIVE Sensitive     TRIMETH/SULFA >=320 RESISTANT Resistant     CLINDAMYCIN >=8 RESISTANT Resistant     RIFAMPIN <=0.5 SENSITIVE Sensitive     Inducible Clindamycin NEGATIVE Sensitive     LINEZOLID 2 SENSITIVE Sensitive     * RARE METHICILLIN RESISTANT STAPHYLOCOCCUS AUREUS    Radiology Studies: DG Foot Complete Left  Result Date: 11/16/2022 CLINICAL DATA:  Status post amputation. EXAM: LEFT FOOT - COMPLETE 3+ VIEW COMPARISON:  Radiographs 11/08/2022 FINDINGS: Surgical changes from partial fifth metatarsal amputation. No complicating features are identified. IMPRESSION: Surgical changes from partial fifth metatarsal amputation. Electronically Signed   By: Rudie Meyer M.D.   On: 11/16/2022 11:22    Scheduled Meds:  amoxicillin-clavulanate  1 tablet Oral Q12H   aspirin EC  81 mg Oral Daily   clopidogrel  75 mg Oral Daily   doxycycline  100 mg Oral Q12H   ezetimibe  10 mg Oral Daily   gemfibrozil  600 mg Oral BID AC   insulin aspart  0-15 Units Subcutaneous TID WC   insulin aspart  0-5 Units  Subcutaneous QHS   insulin aspart  3 Units Subcutaneous TID WC   insulin glargine-yfgn  18 Units Subcutaneous BID   melatonin  10 mg Oral QHS   nicotine  14 mg Transdermal Daily   pantoprazole  40 mg Oral BID   sodium chloride flush  3 mL Intravenous Q12H   Continuous Infusions:  sodium chloride      LOS: 17 days   Marguerita Merles, DO [facility] Available via Epic secure chat 7am-7pm After these hours, please refer to coverage provider listed on [email or web address] 11/16/2022, 7:31 PM

## 2022-11-16 NOTE — Progress Notes (Signed)
Occupational Therapy Treatment Patient Details Name: Kenneth Bailey MRN: [id number] DOB: 11/03/1965 Today's Date: 11/16/2022   History of present illness Pt is a 57 y.o. male admitted on 10/30/2022 with osteomyelitis. He is now s/p Left foot irrigation and debridement with resection of fifth metatarsal head 11/01/22. Pt underwent repeat I&D on 11/08/22. Past medical history significant for uncontrolled type 2 diabetes, diabetic polyneuropathy, coronary artery disease status post CABG, chronic combined diastolic and systolic CHF, current smoker,   OT comments  Pt initially reluctant to partake in OT session but agreeable with mild encouragement, Pt willing to get to bedside recliner. Pt ambulating with Supervision and Mod I for bed mobility, he refuses to wear offloading shoe and states "I don't care I will do things my way." Initially pt was told to be NWB due to an orderset from MD but pt not wanting to maintain that either. After discussion with MD, he prefers NWB but is okay with patient continuing to put weight on L heel only, CAM boot order placed from MD. Informed patient of conversation with MD, pt with not much care and decreased awareness of safety. OT to continue to progress as able, DC plans are currently in motion for SNF but patient may be able to functionally progress to Home Health level pending progression. OT will reassess need for updated DC recs once patient willing to comply more to assess functional capacities.    Recommendations for follow up therapy are one component of a multi-disciplinary discharge planning process, led by the attending physician.  Recommendations may be updated based on patient status, additional functional criteria and insurance authorization.    Assistance Recommended at Discharge Frequent or constant Supervision/Assistance  Patient can return home with the following  Assist for transportation;Help with stairs or ramp for entrance;A little help with  bathing/dressing/bathroom   Equipment Recommendations  Other (comment) (TBD at next level of care)    Recommendations for Other Services      Precautions / Restrictions Precautions Precautions: Fall Precaution Comments: pain management Required Braces or Orthoses: Other Brace (CAM order placed) Other Brace: fore foot off weighting shoe Restrictions Weight Bearing Restrictions: Yes LLE Weight Bearing: Partial weight bearing LLE Partial Weight Bearing Percentage or Pounds: walk on heel Other Position/Activity Restrictions: through heel per MD order, Per Gala Lewandowsky it can be with or without forefoot off shoe       Mobility Bed Mobility Overal bed mobility: Modified Independent                  Transfers Overall transfer level: Needs assistance Equipment used: Rolling walker (2 wheels) Transfers: Sit to/from Stand, Bed to chair/wheelchair/BSC Sit to Stand: Supervision     Step pivot transfers: Supervision     General transfer comment: refusing shoe     Balance Overall balance assessment: Needs assistance Sitting-balance support: Feet supported Sitting balance-Leahy Scale: Good     Standing balance support: Bilateral upper extremity supported, During functional activity, Reliant on assistive device for balance Standing balance-Leahy Scale: Poor Standing balance comment: with RW support                           ADL either performed or assessed with clinical judgement   ADL Overall ADL's : Needs assistance/impaired  Functional mobility during ADLs: Supervision/safety;Rolling walker (2 wheels) General ADL Comments: transferred to recliner and back, not wanting to do much more than that    Extremity/Trunk Assessment              Vision       Perception     Praxis      Cognition Arousal/Alertness: Awake/alert Behavior During Therapy: Impulsive Overall Cognitive Status:  Impaired/Different from baseline                           Safety/Judgement: Decreased awareness of safety     General Comments: Pt declined use of offloading shoe, reports he will do things his way regardless        Exercises      Shoulder Instructions       General Comments Pt refusing to do much more during session, upset about diet restrictions    Pertinent Vitals/ Pain       Pain Assessment Pain Assessment: Faces Faces Pain Scale: Hurts even more Pain Location: L foot Pain Descriptors / Indicators: Aching, Guarding, Grimacing Pain Intervention(s): Limited activity within patient's tolerance, Patient requesting pain meds-RN notified, Repositioned, Monitored during session (pt notified RN himself via call bell)  Home Living                                          Prior Functioning/Environment              Frequency  Min 2X/week        Progress Toward Goals  OT Goals(current goals can now be found in the care plan section)  Progress towards OT goals: Progressing toward goals  Acute Rehab OT Goals Patient Stated Goal: Go to rehab OT Goal Formulation: With patient Time For Goal Achievement: 11/30/22 Potential to Achieve Goals: Good ADL Goals Pt Will Perform Grooming: standing;with supervision Pt Will Transfer to Toilet: ambulating;with modified independence Pt Will Perform Tub/Shower Transfer: with modified independence;ambulating  Plan Discharge plan remains appropriate;Frequency remains appropriate    Co-evaluation                 AM-PAC OT "6 Clicks" Daily Activity     Outcome Measure   Help from another person eating meals?: None Help from another person taking care of personal grooming?: A Little Help from another person toileting, which includes using toliet, bedpan, or urinal?: A Little Help from another person bathing (including washing, rinsing, drying)?: A Little Help from another person to put on and  taking off regular upper body clothing?: A Little Help from another person to put on and taking off regular lower body clothing?: A Lot 6 Click Score: 18    End of Session    OT Visit Diagnosis: Unsteadiness on feet (R26.81);Pain Pain - Right/Left: Left Pain - part of body: Ankle and joints of foot   Activity Tolerance Patient tolerated treatment well   Patient Left in bed;with call bell/phone within reach   Nurse Communication Mobility status        Time: 8119-1478 OT Time Calculation (min): 12 min  Charges: OT General Charges $OT Visit: 1 Visit OT Treatments $Therapeutic Activity: 8-22 mins  11/16/2022  AB, OTR/L  Acute Rehabilitation Services  Office: [phone]   Tristan Schroeder 11/16/2022, 5:57 PM

## 2022-11-16 NOTE — Progress Notes (Signed)
Physical Therapy Treatment Patient Details Name: Kenneth Bailey MRN: [id number] DOB: 1965-09-19 Today's Date: 11/16/2022   History of Present Illness Pt is a 57 y.o. male admitted on 10/30/2022 with osteomyelitis. He is now s/p Left foot irrigation and debridement with resection of fifth metatarsal head 11/01/22. Pt underwent repeat I&D on 11/08/22. Past medical history significant for uncontrolled type 2 diabetes, diabetic polyneuropathy, coronary artery disease status post CABG, chronic combined diastolic and systolic CHF, current smoker,    PT Comments    Pt was seen for careful completion of treatment to walk with NWB hop on LLE and to discuss his goals for therapy.  Pt is slow to prepare for gait, and once up requires standing rests to manage his endurance for limitations.  Pt is refusing the shoe for LLE, but also knows that it is ordered for him by MD.  Pt is not seeing the point of his dietary and ortho limits, concern for follow up care.  Follow along with him to encourage shoe and compliance with his program.     Recommendations for follow up therapy are one component of a multi-disciplinary discharge planning process, led by the attending physician.  Recommendations may be updated based on patient status, additional functional criteria and insurance authorization.  Follow Up Recommendations  Can patient physically be transported by private vehicle: Yes    Assistance Recommended at Discharge Frequent or constant Supervision/Assistance  Patient can return home with the following A little help with bathing/dressing/bathroom;Help with stairs or ramp for entrance;Assistance with cooking/housework;A little help with walking and/or transfers   Equipment Recommendations  Rolling walker (2 wheels)    Recommendations for Other Services       Precautions / Restrictions Precautions Precautions: Fall Precaution Comments: pain management Required Braces or Orthoses: Other Brace Other  Brace: fore foot off weighting shoe Restrictions Weight Bearing Restrictions: Yes LLE Weight Bearing: Partial weight bearing LLE Partial Weight Bearing Percentage or Pounds: walk on heel Other Position/Activity Restrictions: through heel per MD order     Mobility  Bed Mobility Overal bed mobility: Modified Independent                  Transfers Overall transfer level: Needs assistance Equipment used: Rolling walker (2 wheels) Transfers: Sit to/from Stand Sit to Stand: Supervision           General transfer comment: refusing shoe and struggling to maintain Heel WB to NWB    Ambulation/Gait Ambulation/Gait assistance: Supervision Gait Distance (Feet): 50 Feet Assistive device: Rolling walker (2 wheels) Gait Pattern/deviations:  (hop to pattern) Gait velocity: reduced     General Gait Details: hopping on RLE to avoid heel pressure   Stairs             Wheelchair Mobility    Modified Rankin (Stroke Patients Only)       Balance Overall balance assessment: Needs assistance Sitting-balance support: Feet supported Sitting balance-Leahy Scale: Good     Standing balance support: Bilateral upper extremity supported, During functional activity, Reliant on assistive device for balance Standing balance-Leahy Scale: Poor Standing balance comment: with RW support                            Cognition Arousal/Alertness: Awake/alert Behavior During Therapy: Impulsive Overall Cognitive Status: Impaired/Different from baseline Area of Impairment: Following commands, Safety/judgement, Problem solving  Following Commands: Follows one step commands with increased time Safety/Judgement: Decreased awareness of safety, Decreased awareness of deficits   Problem Solving: Requires verbal cues, Requires tactile cues General Comments: declining use of offloading shoe, reports he is not willing and wants to just walk on heel         Exercises      General Comments General comments (skin integrity, edema, etc.): pt is mildly agitated about having diet restrictions and states he allows himself treats despite being diabetic, does not understand all the limitations      Pertinent Vitals/Pain Pain Assessment Pain Assessment: Faces Faces Pain Scale: Hurts little more Pain Location: L foot Pain Descriptors / Indicators: Aching, Guarding, Grimacing Pain Intervention(s): Limited activity within patient's tolerance, Monitored during session, Premedicated before session, Repositioned    Home Living                          Prior Function            PT Goals (current goals can now be found in the care plan section) Acute Rehab PT Goals Patient Stated Goal: to go home soon    Frequency    Min 3X/week      PT Plan Frequency needs to be updated    Co-evaluation              AM-PAC PT "6 Clicks" Mobility   Outcome Measure  Help needed turning from your back to your side while in a flat bed without using bedrails?: None Help needed moving from lying on your back to sitting on the side of a flat bed without using bedrails?: A Little Help needed moving to and from a bed to a chair (including a wheelchair)?: A Little Help needed standing up from a chair using your arms (e.g., wheelchair or bedside chair)?: A Little Help needed to walk in hospital room?: A Little Help needed climbing 3-5 steps with a railing? : Total 6 Click Score: 17    End of Session Equipment Utilized During Treatment: Gait belt Activity Tolerance: Treatment limited secondary to agitation Patient left: in bed;with call bell/phone within reach Nurse Communication: Mobility status PT Visit Diagnosis: Other abnormalities of gait and mobility (R26.89);Pain Pain - Right/Left: Left Pain - part of body: Ankle and joints of foot     Time: 1610-9604 PT Time Calculation (min) (ACUTE ONLY): 32 min  Charges:  $Gait  Training: 8-22 mins $Therapeutic Activity: 8-22 mins        Ivar Drape 11/16/2022, 4:34 PM  Samul Dada, PT PhD Acute Rehab Dept. Number: [facility] [id number] and [facility] [phone]

## 2022-11-17 ENCOUNTER — Other Ambulatory Visit (HOSPITAL_COMMUNITY): Payer: Self-pay

## 2022-11-17 DIAGNOSIS — L02612 Cutaneous abscess of left foot: Secondary | ICD-10-CM | POA: Diagnosis not present

## 2022-11-17 DIAGNOSIS — A419 Sepsis, unspecified organism: Secondary | ICD-10-CM | POA: Diagnosis not present

## 2022-11-17 DIAGNOSIS — K269 Duodenal ulcer, unspecified as acute or chronic, without hemorrhage or perforation: Secondary | ICD-10-CM | POA: Diagnosis not present

## 2022-11-17 DIAGNOSIS — M86172 Other acute osteomyelitis, left ankle and foot: Secondary | ICD-10-CM | POA: Diagnosis not present

## 2022-11-17 LAB — CBC WITH DIFFERENTIAL/PLATELET
Abs Immature Granulocytes: 0.14 10*3/uL — ABNORMAL HIGH (ref 0.00–0.07)
Basophils Absolute: 0.1 10*3/uL (ref 0.0–0.1)
Basophils Relative: 1 %
Eosinophils Absolute: 0.2 10*3/uL (ref 0.0–0.5)
Eosinophils Relative: 2 %
HCT: 26.5 % — ABNORMAL LOW (ref 39.0–52.0)
Hemoglobin: 8.8 g/dL — ABNORMAL LOW (ref 13.0–17.0)
Immature Granulocytes: 1 %
Lymphocytes Relative: 18 %
Lymphs Abs: 1.8 10*3/uL (ref 0.7–4.0)
MCH: 28 pg (ref 26.0–34.0)
MCHC: 33.2 g/dL (ref 30.0–36.0)
MCV: 84.4 fL (ref 80.0–100.0)
Monocytes Absolute: 0.6 10*3/uL (ref 0.1–1.0)
Monocytes Relative: 7 %
Neutro Abs: 7.1 10*3/uL (ref 1.7–7.7)
Neutrophils Relative %: 71 %
Platelets: 414 10*3/uL — ABNORMAL HIGH (ref 150–400)
RBC: 3.14 MIL/uL — ABNORMAL LOW (ref 4.22–5.81)
RDW: 14.3 % (ref 11.5–15.5)
WBC: 9.9 10*3/uL (ref 4.0–10.5)
nRBC: 0 % (ref 0.0–0.2)

## 2022-11-17 LAB — COMPREHENSIVE METABOLIC PANEL
ALT: 12 U/L (ref 0–44)
AST: 17 U/L (ref 15–41)
Albumin: 2.9 g/dL — ABNORMAL LOW (ref 3.5–5.0)
Alkaline Phosphatase: 110 U/L (ref 38–126)
Anion gap: 12 (ref 5–15)
BUN: 34 mg/dL — ABNORMAL HIGH (ref 6–20)
CO2: 24 mmol/L (ref 22–32)
Calcium: 9.3 mg/dL (ref 8.9–10.3)
Chloride: 94 mmol/L — ABNORMAL LOW (ref 98–111)
Creatinine, Ser: 1.33 mg/dL — ABNORMAL HIGH (ref 0.61–1.24)
GFR, Estimated: >60 mL/min (ref >60)
Glucose, Bld: 209 mg/dL — ABNORMAL HIGH (ref 70–99)
Potassium: 4.5 mmol/L (ref 3.5–5.1)
Sodium: 130 mmol/L — ABNORMAL LOW (ref 135–145)
Total Bilirubin: 0.4 mg/dL (ref 0.3–1.2)
Total Protein: 7.2 g/dL (ref 6.5–8.1)

## 2022-11-17 LAB — GLUCOSE, CAPILLARY
Glucose-Capillary: 107 mg/dL — ABNORMAL HIGH (ref 70–99)
Glucose-Capillary: 195 mg/dL — ABNORMAL HIGH (ref 70–99)
Glucose-Capillary: 189 mg/dL — ABNORMAL HIGH (ref 70–99)

## 2022-11-17 LAB — PHOSPHORUS: Phosphorus: 4.9 mg/dL — ABNORMAL HIGH (ref 2.5–4.6)

## 2022-11-17 LAB — SURGICAL PATHOLOGY

## 2022-11-17 LAB — MAGNESIUM: Magnesium: 1.7 mg/dL (ref 1.7–2.4)

## 2022-11-17 MED ORDER — INSULIN GLARGINE-YFGN 100 UNIT/ML ~~LOC~~ SOLN: 20.0000 [IU] | Freq: Two times a day (BID) | SUBCUTANEOUS | 0 refills | Status: DC

## 2022-11-17 MED ORDER — HYDROMORPHONE HCL 1 MG/ML IJ SOLN
0.5000 mg | Freq: Once | INTRAMUSCULAR | Status: AC
  Administered 2022-11-17: 0.5 mg via INTRAVENOUS
  Filled 2022-11-17: qty 0.5

## 2022-11-17 MED ORDER — INSULIN ASPART 100 UNIT/ML IJ SOLN
0.0000 [IU] | Freq: Three times a day (TID) | INTRAMUSCULAR | 0 refills | Status: DC
  Filled 2022-11-17: qty 9, 33d supply, fill #0
  Filled 2022-11-17: qty 10, 28d supply, fill #0

## 2022-11-17 MED ORDER — INSULIN ASPART 100 UNIT/ML IJ SOLN: 0.0000 [IU] | Freq: Three times a day (TID) | INTRAMUSCULAR | Status: DC

## 2022-11-17 MED ORDER — "INSULIN SYRINGE-NEEDLE U-100 30G X 1/2"" 0.3 ML MISC"
0 refills | Status: AC
  Filled 2022-11-17: qty 90, 30d supply, fill #0

## 2022-11-17 NOTE — TOC Initial Note (Addendum)
Transition of Care (TOC) - Initial/Assessment Note   Spoke to patient at bedside.   Confirmed face sheet information. Patient has decided to go home at discharge due to co pays.   Patient lives with friend. Patient aware home health can only visit a couple times a week for about a hour at a time.   Patient has no preference in home health agency.   Per MD note WBAT on heel only . Dressing to be left intact until follow up appointment .   PT recommended rolling walker and MD ordered same. However, patient requesting a rollator . NCM secure chatted MD, PT and OT . Await response   Also patient plans to drive himself home at discharge . Secure chatted team . Per Dr Logan Bores , patient has no driving restrictions   Rollator approved by Dr Logan Bores. Ordered entered asked Dr Logan Bores to sign. Called Zackary with [employer] to deliver Rollator to room. Once signed [employer] will process the order   Tresa Endo with [facility] accepted referral for HHPT/OT . Patient aware  Patient Details  Name: Kenneth Bailey MRN: [id number] Date of Birth: [birth year]  Transition of Care [facility]) CM/SW Contact:    Kingsley Plan, RN Phone Number: 11/17/2022, 12:53 PM  Clinical Narrative:                   Expected Discharge Plan: Home w Home Health Services Barriers to Discharge: Continued Medical Work up, SNF Pending bed offer, Insurance Authorization   Patient Goals and CMS Choice Patient states their goals for this hospitalization and ongoing recovery are:: to return to home CMS Medicare.gov Compare Post Acute Care list provided to:: Patient Choice offered to / list presented to : Patient      Expected Discharge Plan and Services   Discharge Planning Services: CM Consult Post Acute Care Choice: Home Health, Durable Medical Equipment Living arrangements for the past 2 months: Single Family Home Expected Discharge Date: 11/07/22               DME Arranged:  (see note)         HH Arranged: RN, PT, OT  (see note)          Prior Living Arrangements/Services Living arrangements for the past 2 months: Single Family Home Lives with:: Friends Patient language and need for interpreter reviewed:: Yes Do you feel safe going back to the place where you live?: Yes      Need for Family Participation in Patient Care: Yes (Comment) Care giver support system in place?:  (lives with friend) Current home services: DME (crutches) Criminal Activity/Legal Involvement Pertinent to Current Situation/Hospitalization: No - Comment as needed  Activities of Daily Living Home Assistive Devices/Equipment: Dentures (specify type), Eyeglasses ADL Screening (condition at time of admission) Patient's cognitive ability adequate to safely complete daily activities?: Yes Is the patient deaf or have difficulty hearing?: No Does the patient have difficulty seeing, even when wearing glasses/contacts?: No Does the patient have difficulty concentrating, remembering, or making decisions?: No Patient able to express need for assistance with ADLs?: Yes Does the patient have difficulty dressing or bathing?: No Independently performs ADLs?: Yes (appropriate for developmental age) Does the patient have difficulty walking or climbing stairs?: No Weakness of Legs: Left Weakness of Arms/Hands: None  Permission Sought/Granted   Permission granted to share information with : No              Emotional Assessment Appearance:: Appears stated age Attitude/Demeanor/Rapport: Engaged Affect (typically  observed): Accepting Orientation: : Oriented to Self, Oriented to Place, Oriented to  Time, Oriented to Situation Alcohol / Substance Use: Not Applicable Psych Involvement: No (comment)  Admission diagnosis:  Osteomyelitis (HCC) [M86.9] AKI (acute kidney injury) (HCC) [N17.9] Osteomyelitis, unspecified site, unspecified type (HCC) [M86.9] Sepsis, due to unspecified organism, unspecified whether acute organ dysfunction  present [facility]) [A41.9] Patient Active Problem List   Diagnosis Date Noted   Sepsis (HCC) 11/11/2022   Gastric ulcer 11/06/2022   Duodenal ulcer 11/06/2022   [place] grade D esophagitis 11/06/2022   Abscess of left foot 11/01/2022   Osteomyelitis (HCC) 10/30/2022   Tobacco abuse 09/29/2022   Hypertension 09/29/2022   HLD (hyperlipidemia) 09/29/2022   Diabetes mellitus, type 2 (HCC) 09/29/2022   COPD (chronic obstructive pulmonary disease) (HCC) 09/29/2022   Atherosclerotic heart disease 09/07/2021   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/11/2021   Acute systolic heart failure (HCC) 06/06/2021   Abscess of perineum 07/17/2018   Type 2 diabetes mellitus with hyperglycemia (HCC) 07/09/2018   Nicotine dependence, unspecified, uncomplicated 03/18/2018   Male erectile dysfunction, unspecified 05/17/2017   Chest pain 12/20/2016   Postural hypotension 12/20/2016   Syncope 12/20/2016   Anemia, unspecified 12/14/2016   PCP:  [name], MD Pharmacy:   [facility] - [place], [place] - [address] [place] [address] Phone: [phone] Fax: [phone]     Social Determinants of Health (SDOH) Social History: SDOH Screenings   Food Insecurity: No Food Insecurity (10/30/2022)  Housing: Low Risk  (10/30/2022)  Transportation Needs: No Transportation Needs (10/30/2022)  Utilities: Not At Risk (10/30/2022)  Financial Resource Strain: High Risk (01/09/2022)  Tobacco Use: High Risk (11/16/2022)   SDOH Interventions:     Readmission Risk Interventions     No data to display

## 2022-11-17 NOTE — Progress Notes (Signed)
OT Cancellation Note  Patient Details Name: Kenneth Bailey MRN: [id number] DOB: 17-Dec-1965   Cancelled Treatment:    Reason Eval/Treat Not Completed: Patient declined, no reason specified (attempted to see patient 2x for therapy (one in AM and once in PM). Pt refused both attempts, reports he is waiting on DC home and would not like to do therapy.)  11/17/2022  AB, OTR/L  Acute Rehabilitation Services  Office: [phone]   Tristan Schroeder 11/17/2022, 6:10 PM

## 2022-11-17 NOTE — Progress Notes (Signed)
VSS. Rounds and  Q2T completed. Patient turned self. Patient in bed resting. No signs of distress at this time. Will continue to monitor. 

## 2022-11-17 NOTE — TOC Progression Note (Signed)
Transition of Care [facility]) - Progression Note    Patient Details  Name: Kenneth Bailey MRN: [id number] Date of Birth: 1965-10-28  Transition of Care [facility] CM/SW Contact  Erin Sons, [place] Phone Number: 11/17/2022, 12:52 PM  Clinical Narrative:     CSW met with pt to discuss disposition. CSW explained that [facility] no longer had a bed. Only other facility from SNF offer list that pt would consider is [facility]. He states he would be unable to pay copays up front so if they cannot accommodate that he would just DC home instead.   CSW contacted [facility]; awaiting response.  1200: CSW informed that [facility] can no longer offer a bed either. CSW notified pt. He states he will go home. He has no preference in [facility] agency. States he plans to get a walker from his mother. RNCM notified.   Expected Discharge Plan: Skilled Nursing Facility Barriers to Discharge: Continued Medical Work up, SNF Pending bed offer, English as a second language teacher  Expected Discharge Plan and Services   Discharge Planning Services: CM Consult Post Acute Care Choice: Durable Medical Equipment, Home Health Living arrangements for the past 2 months: Mobile Home Expected Discharge Date: 11/07/22               DME Arranged: Dan Humphreys rolling         HH Arranged: RN, PT, OT           Social Determinants of Health (SDOH) Interventions SDOH Screenings   Food Insecurity: No Food Insecurity (10/30/2022)  Housing: Low Risk  (10/30/2022)  Transportation Needs: No Transportation Needs (10/30/2022)  Utilities: Not At Risk (10/30/2022)  Financial Resource Strain: High Risk (01/09/2022)  Tobacco Use: High Risk (11/16/2022)    Readmission Risk Interventions     No data to display

## 2022-11-17 NOTE — Plan of Care (Signed)
  Problem: Coping: Goal: Ability to adjust to condition or change in health will improve Outcome: Progressing   Problem: Fluid Volume: Goal: Ability to maintain a balanced intake and output will improve Outcome: Progressing   

## 2022-11-17 NOTE — Discharge Summary (Signed)
Physician Discharge Summary   Patient: Kenneth Bailey MRN: [id number] DOB: Aug 08, 1965  Admit date:     10/30/2022  Discharge date: 11/17/2022  Discharge Physician: Marguerita Merles, DO   PCP: [name], MD   Recommendations at discharge:   Follow up with PCP within 1-2 weeks and repeat CBC, CMP, Mag, Phos Follow up with Podiatry within 1-2 weeks Follow up with Infectious Diseases within 1-2 weeks and continue Abx to Completion.  Follow-up with Gastroenterology in outpatient setting Follow-up with Vascular Surgery in outpatient setting  Discharge Diagnoses: Principal Problem:   Osteomyelitis [facility]) Active Problems:   Abscess of left foot   Gastric ulcer   Duodenal ulcer   [place] grade D esophagitis   Sepsis (HCC)  Resolved Problems:   * No resolved hospital problems. [facility] Course: The patient is a 57 year old chronically ill-appearing Caucasian male with a past medical history significant for but not limited to uncontrolled diabetes mellitus type 2, CAD status post CABG, chronic combined systolic and diastolic CHF, tobacco abuse as well as other comorbidities who presented to [facility] emergency department at the recommendation of his podiatrist given his persistent left foot pain and concern for osteomyelitis of the left fifth toe.  He has been suffering with a left foot diabetic ulcer for approximately 4 weeks and will prescribe a course of antibiotics through the hospital hospital ED and was referred to podiatry who did follow him for approximate 1 week.  Imaging was done and the podiatrist evaluated and there is finding concerns for osteomyelitis.  Patient was contacted and instructed to go to [name] ED.  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    He was  taken to the OR and is status post left foot incision and drainage with resection of fifth metatarsal head on 6/11 with cultures currently growing MRSA.  ID has been consulted for further evaluation recommendations. Given proximal incision necrosis, Podiatry recommends Vascular Consult for evaluation for wound healing and Vascular consulted and recommending obtaining an angiogram of the left lower leg with possible intervention through transfemoral access given that they suspect that he has tibial disease.  Patient was taken to the Cath Lab 11/03/22 and he underwent a left posterior tibial artery and tibioperoneal trunk angioplasty and was placed on aspirin and Plavix but no statin given his allergy.  Vascular surgery feels that he is optimized from their standpoint.  Patient has been complicated by hematemesis and melanotic stools.  He was evaluated by GI and went under EGD and was found to have reflux esophagitis as well as a nonbleeding gastric ulcer with a clean base and a nonbleeding duodenal ulcer with flat pigmented spot biopsies were taken.  He is continue on PPI therapy and is recommended that he takes it 40 mg twice daily.  He was typed and screened and transfused 3 units of PRBCs.  He had a repeat I&D of his left foot and washout done on 11/08/2022.  Subsequently patient failed to improve and repeat MRI was done on 11/13/2022 which revealed findings consistent with residual osteomyelitis.  PT OT recommending SNF at discharge but the current plan is for the patient to return to the OR on 11/15/2022 given that he continues to have left pain in his foot.  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  ID was reconsulted given his leukocytosis and concern for lung wound healing and the recommendation from the infectious disease team was 6 weeks of Augmentin and doxycycline -ABIs done and showed "Right: Resting right  ankle-brachial index indicates moderate right lower  extremity arterial disease. The right toe-brachial index is abnormal. 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 as well as phlegmon/early abscess formation  -WBC Trend: Recent Labs  Lab 11/04/22 1730 11/05/22 0547 11/11/22 0136 11/12/22 0153 11/13/22 0704 11/14/22 0800 11/15/22 0048 11/16/22 0433 11/17/22 1045  WBC  --    < > 16.4* 12.7* 11.7* 10.3 11.8* 11.8* 9.9  LATICACIDVEN 1.3  --   --   --   --   --   --   --   --    < > = values in this interval not displayed.  -The patient is s/p left foot I&D with resection of fifth metatarsal head 6/11 in OR with repeat I&D on 11/08/2022 and is going back to the OR on 11/15/2022 given that he has acute residual osteomyelitis involving the remaining fifth metatarsal and fifth ray and he is going for a revisional fifth ray amputation -Initial blood Cx x2 showing NGTD at 5 Days and repeat Blood Cx 11/04/22 showed NGTD at 5 Days -Initial Wound Cx obtained and showing: Gram Stain NO WBC SEEN RARE GRAM POSITIVE COCCI IN PAIRS RARE GRAM NEGATIVE RODS  Culture FEW METHICILLIN RESISTANT STAPHYLOCOCCUS AUREUS NO ANAEROBES ISOLATED Performed at [facility], [address], [place], [place] [address]  Report Status 11/05/2022  FINAL  Organism ID, Bacteria METHICILLIN RESISTANT STAPHYLOCOCCUS AUREUS  Resulting Agency [facility]     Susceptibility   Methicillin resistant staphylococcus aureus    MIC    CIPROFLOXACIN >=8 RESISTANT Resistant    CLINDAMYCIN >=8 RESISTANT Resistant    ERYTHROMYCIN >=8 RESISTANT Resistant    GENTAMICIN <=0.5 SENSI... Sensitive    Inducible Clindamycin NEGATIVE Sensitive    LINEZOLID 2 SENSITIVE Sensitive    OXACILLIN >=4 RESISTANT Resistant    RIFAMPIN <=0.5 SENSI... Sensitive    TETRACYCLINE <=1 SENSITIVE Sensitive    TRIMETH/SULFA 160 RESISTANT Resistant    VANCOMYCIN <=0.5 SENSI... Sensitive         -Repeat Wound Cx done and showing: Gram Stain NO WBC SEEN NO ORGANISMS SEEN  Culture RARE METHICILLIN RESISTANT STAPHYLOCOCCUS AUREUS RARE ENTEROCOCCUS FAECALIS CRITICAL RESULT CALLED TO, READ BACK BY AND VERIFIED WITH: Alene Mires [id number] AT 1232 BY EC NO ANAEROBES ISOLATED Performed at [facility], [address] [place], [place] [address]  Report Status 11/13/2022 FINAL  Organism ID, Bacteria METHICILLIN RESISTANT STAPHYLOCOCCUS AUREUS  Organism ID, Bacteria ENTEROCOCCUS FAECALIS  Resulting Agency [facility]     Susceptibility    Methicillin resistant staphylococcus aureus Enterococcus  faecalis   MIC MIC   AMPICILLIN   <=2 SENSITIVE Sensitive   CIPROFLOXACIN >=8 RESISTANT Resistant     CLINDAMYCIN >=8 RESISTANT Resistant     ERYTHROMYCIN >=8 RESISTANT Resistant     GENTAMICIN <=0.5 SENSI... Sensitive     GENTAMICIN SYNERGY   SENSITIVE Sensitive   Inducible Clindamycin NEGATIVE Sensitive     LINEZOLID 2 SENSITIVE Sensitive     OXACILLIN >=4 RESISTANT Resistant     RIFAMPIN <=0.5 SENSI... Sensitive     TETRACYCLINE <=1 SENSITIVE Sensitive     TRIMETH/SULFA >=320 RESIS... Resistant     VANCOMYCIN 1 SENSITIVE Sensitive 2 SENSITIVE Sensitive         -Continue with po Oxycodone 5 mg po q6hprn Moderate/Breakthrough Pain and IV Hydromorphone 0.5 mg  IV q4hprn Severe Pain  -Vascular did an Abdominal aortogram with left lower extremity runoff  and he had a left posterior tibial artery and tibioperoneal trunk angioplasty and was placed on Plavix -Vascular feels that the patient has inline flow down the left lower extremity to the posterior tibial artery retrograde after his and feels that he is optimized from a vascular standpoint and recommending Aspirin Plavix however this is now been stopped given his GI bleeding,: GI recommended dual antiplatelet therapy can be resumed on both aspirin and Plavix have been resumed -PT/OT initially recommended home health but patient needs further PT evaluation and did not participate given his pain has had repeat procedures with the I&D and now going back to the OR and went to the OR on 11/15/2022 and he is postoperative day 1 for 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 -She has now modified his weightbearing status to heel pressure only Podiatry has now cleared the patient for discharge will continue antibiotic management and will need to go to SNF for further evaluation and now the podiatrist recommends leaving the dressings in place intact until follow-up in office -Patient is stable for discharge   Acute Kidney Injury, Renal Fxn Fluctuating  Metabolic Acidosis, improved -Likely prerenal in setting of infection with poor intake -Baseline creatinine approximately 1.3 with creatinine 2.9 at presentation  -BUN/Cr Trend: Recent Labs  Lab 11/10/22 0138 11/12/22 0153 11/13/22 0704 11/14/22 0800 11/15/22 0048 11/16/22 0433 11/17/22 1045  BUN 33* 26* 34* 31* 32* 31* 34*  CREATININE 1.23 1.02 1.31* 1.17 1.32* 1.21 1.33*  -Has a CO2 of 25, anion gap of 9, chloride level of 97 -IVF now stopped -Avoid Nephrotoxic Medications, Contrast Dyes, Hypotension and Dehydration to Ensure Adequate Renal Perfusion and will need to Renally Adjust  Meds -Continue to Monitor and Trend Renal Function carefully and repeat CMP within 1 week  Chronic Combined Systolic and Diastolic CHF -Euvolemic/somewhat dehydrated at presentation -follow with gentle volume expansion No intake or output data in the 24 hours ending 11/19/22 1815 -Strict I's and O's and Daily Weights -Patient is status post 3 units of PRBCs -Patient is -5.804 L since admission -Continue to Monitor for Signs and Symptoms of Volume Overload   Syncopal Episode in the setting of ABLA from Ulcers -In the Setting of GI bleeding and patient passed out after coming back from the bathroom and was lowered to the floor by nursing staff -Was placed on a Protonix drip and received 3 units of PRBCs and 1 L bolus when this happened -Continue with progressive care monitoring and will need PT OT to further evaluate and treat now that he is more  stable -Patient did well and will need to follow-up in outpatient setting  Hyperkalemia -Patient's potassium level trend: Recent Labs  Lab 11/10/22 0138 11/12/22 0153 11/13/22 0704 11/14/22 0800 11/15/22 0048 11/16/22 0433 11/17/22 1045  K 4.5 4.5 4.5 4.5 4.3 5.3* 4.5  -Given a dose of Lokelma 10 g x 1 -Continue to monitor and trend and repeat CMP within 1 week  Nausea and Vomiting, improved -In the setting of Abx and now GI Bleeding -Start IV Zofran and IV Compazine   Tobacco Abuse -Has been counseled on the absolute need to discontinue Tobacco completely -Continue with nicotine patch 14 mg transdermal every 24 hours  PAD -Seen by vascular surgery and he is status post abdominal aortogram with the left posterior tibial artery and to rule peroneal trunk angioplasty -Continue with aspirin, Plavix and currently not on a statin -Follow-up with vascular in outpatient setting  HLD -C/w Gemfibrozil 600 mg po BID  -Repeat panel done and showed a total cholesterol/HDL ratio 7.0, cholesterol level 140, HDL 21, LDL 76, triglycerides 253,  VLDL 51   CAD status post CABG -Asymptomatic at present -Initially was holding aspirin 81 mg p.o. daily as well as now clopidogrel 75 mg p.o. daily given GI bleeding the day before yesterday and is found to have gastric ulcers and duodenal ulcer as well as [place] grade D reflux esophagitis with no bleeding: Now back on aspirin and Plavix -Not on a Statin due to Adverse Effects/Allergies    Uncontrolled DM2 -CBG controlled as an inpatient  -Recent A1c was 10.7 -C/w Diabetic education and adjustments -Will increase Semglee to 18 units twice daily and will also add 3 units of NovoLog 3 times daily meal coverage -Continue to Monitor CBGs per protocol; CBG Trend: Recent Labs  Lab 11/16/22 1233 11/16/22 1659 11/16/22 2122 11/16/22 2200 11/17/22 0806 11/17/22 1139 11/17/22 1639  GLUCAP 184* 207* 206* 215* 107* 195* 189*  -Regimen adjusted for discharge and will send him out on long-acting twice daily along with his regular home sliding scale and metformin as well as Farxiga  Normocytic Anemia and now ABLA in the setting of upper GI bleed with [place] grade D reflux esophagitis and nonbleeding gastric ulcers with a clean base and a nonbleeding duodenal ulcer with a flat pigmented spot with Hematemesis and Melena -Hgb/Hct Trend: Recent Labs  Lab 11/11/22 0136 11/12/22 0153 11/13/22 0704 11/14/22 0800 11/15/22 0048 11/16/22 0433 11/17/22 1045  HGB 8.5* 8.6* 9.0* 8.2* 8.5* 8.3* 8.8*  HCT 24.9* 25.7* 26.8* 25.2* 26.0* 25.1* 26.5*  MCV 85.6 85.7 87.6 85.4 85.8 85.4 84.4  -Checked Anemia Panel and showed an iron level of 17, UIBC of 299, TIBC 360, saturation ratios 5%, ferritin 193, folate 9.5, vitamin B12 165 -Initiated on iron supplementation but this will not be stopped given his dark emesis and melanotic stools -Start IV PPI and type and screen and transfuse 2 units of PRBCs given that hemoglobin has now dropped to 7 -He was FOBT positive but has not had any further GI bleeding or episodes of  hematochezia or melena -That is post 3 units of PRBCs were transfused -Held his aspirin and Plavix but GI recommending that spring can be resumed 11/06/2022 and Plavix can be resumed on 618 07/03/2022 -Continue to Monitor for S/Sx of Bleeding; No overt bleeding noted now -Repeat CBC in the AM; GI signed off the case and recommending calling back and there is any questions or concerns and recommending continuing PPI twice daily.  Patient has no further  emesis or abdominal discomfort  Thrombocytosis -Setting of above -Continue monitor platelet count Trend Recent Labs  Lab 11/11/22 0136 11/12/22 0153 11/13/22 0704 11/14/22 0800 11/15/22 0048 11/16/22 0433 11/17/22 1045  PLT 365 374 443* 444* 461* 430* 414*  -Repeat CBC in the AM  Hyponatremia -Na+ Trend: Recent Labs  Lab 11/10/22 0138 11/12/22 0153 11/13/22 0704 11/14/22 0800 11/15/22 0048 11/16/22 0433 11/17/22 1045  NA 131* 130* 133* 133* 130* 131* 130*  -Continue to Monitor and Trend and Repeat CMP in the AM  GERD/GI prophylaxis/gastric ulcer and duodenal ulcer -Continue with twice daily PPI and follow-up with GI in outpatient setting -Gastroenterology was following and now signed off  Hypoalbuminemia -Patient's Albumin Trend: Recent Labs  Lab 11/05/22 0547 11/06/22 0153 11/07/22 0136 11/08/22 0109 11/12/22 0153 11/16/22 0433 11/17/22 1045  ALBUMIN 2.2* 2.0* 2.5* 2.5* 2.6* 2.8* 2.9*  -Continue to Monitor and Trend and repeat CMP in the AM  Tobacco Abuse -Continue with Nicotine Patch 14 mg TD -Smoking cessation counseling given  Generalized deconditioning and physical debility -PT initially recommended home health but now they are recommending SNF at discharge given that patient lives with his mother however patient is not willing to go to SNF given lack of resources and finding and will go home  Consultants:  Vascular Surgery ID Gastroenterology Podiatry   Procedures performed: EGD and Partial Foot  Ampuation  Disposition: Home health Diet recommendation:  Discharge Diet Orders (From admission, onward)     Start     Ordered   11/17/22 0000  Diet - low sodium heart healthy        11/17/22 1806   11/17/22 0000  Diet Carb Modified        11/17/22 1806   11/07/22 0000  Diet - low sodium heart healthy        11/07/22 1624           Cardiac and Carb modified diet DISCHARGE MEDICATION: Allergies as of 11/17/2022       Reactions   Lisinopril    Leg swelling   Statins    Leg swelling        Medication List     STOP taking these medications    Entresto 24-26 MG Generic drug: sacubitril-valsartan       TAKE these medications    acetaminophen 325 MG tablet Commonly known as: TYLENOL Take 2 tablets (650 mg total) by mouth every 6 (six) hours as needed for mild pain, fever or headache. What changed:  how much to take reasons to take this   amoxicillin-clavulanate 875-125 MG tablet Commonly known as: AUGMENTIN Take 1 tablet by mouth 2 (two) times daily.   aspirin EC 81 MG tablet Take 1 tablet (81 mg total) by mouth daily. Swallow whole.   clopidogrel 75 MG tablet Commonly known as: PLAVIX Take 1 tablet (75 mg total) by mouth daily.   dapagliflozin propanediol 10 MG Tabs tablet Commonly known as: Farxiga Take 1 tablet (10 mg total) by mouth daily before breakfast.   doxycycline 100 MG capsule Commonly known as: VIBRAMYCIN Take 1 capsule (100 mg total) by mouth 2 (two) times daily.   ezetimibe 10 MG tablet Commonly known as: ZETIA Take 1 tablet (10 mg total) by mouth daily.   gemfibrozil 600 MG tablet Commonly known as: LOPID Take 600 mg by mouth 2 (two) times daily before a meal.   insulin glargine-yfgn 100 UNIT/ML injection Commonly known as: Semglee (yfgn) Inject 0.2 mLs (20 Units total) into the  skin 2 (two) times daily. Sliding What changed: how much to take   Melatonin 10 MG Tabs Take 10 mg by mouth at bedtime.   metFORMIN 1000 MG  tablet Commonly known as: GLUCOPHAGE Take 1,000 mg by mouth 2 (two) times daily.   nicotine 14 mg/24hr patch Commonly known as: NICODERM CQ - dosed in mg/24 hours Place 1 patch (14 mg total) onto the skin daily.   nitroGLYCERIN 0.4 MG SL tablet Commonly known as: NITROSTAT Place 1 tablet (0.4 mg total) under the tongue every 5 (five) minutes as needed for chest pain.   NovoLOG 100 UNIT/ML injection Generic drug: insulin aspart Inject 0-9 Units into the skin 3 (three) times daily with meals. CBG < 70: Implement Hypoglycemia Standing Orders CBG 70 - 120: 0 units CBG 121 - 150: 1 unit CBG 151 - 200: 2 units CBG 201 - 250: 3 units CBG 251 - 300: 5 units CBG 301 - 350: 7 units CBG 351 - 400: 9 units CBG > 400: call MD and obtain STAT lab verification   pantoprazole 40 MG tablet Commonly known as: Protonix Take 1 tablet (40 mg total) by mouth 2 (two) times daily.   polyethylene glycol 17 g packet Commonly known as: MIRALAX / GLYCOLAX Take 17 g by mouth daily as needed for mild constipation.   UltiCare Insulin Syringe 30G X 1/2" 0.3 ML Misc Generic drug: Insulin Syringe-Needle U-100 Use with Novolog       ASK your doctor about these medications    oxyCODONE 5 MG immediate release tablet Commonly known as: Oxy IR/ROXICODONE Take 1 tablet (5 mg total) by mouth every 6 (six) hours as needed for up to 5 days for moderate pain or breakthrough pain. Ask about: Should I take this medication?               Discharge Care Instructions  (From admission, onward)           Start     Ordered   11/17/22 0000  Discharge wound care:       Comments: Betadine to incision followed by 4x4 gauze kerlex and ACE and keep Dressings Clean, Dry, and Intact until follow up with Podiatry   11/17/22 1806   11/07/22 0000  Discharge wound care:       Comments: Betadine to incision followed by 4x4 gauze kerlex and ACE   11/07/22 1624            Follow-up Information     [place]  [facility] Follow up in 5 week(s).   Specialty: Vascular Surgery Why: sent Contact information: [address] [place] [address] [phone]        [facility] Follow up.   Specialty: Home Health Services Contact information: [address] [place] [address] [place] [address] [phone]                Discharge Exam: Ceasar Mons Weights   11/15/22 0435 11/16/22 0500 11/17/22 0602  Weight: 74.5 kg 74.6 kg 71.4 kg   Vitals:   11/17/22 0804 11/17/22 1641  BP: (!) 144/75 132/82  Pulse: 71 79  Resp: 17 17  Temp: 97.8 F (36.6 C) 98.7 F (37.1 C)  SpO2: 100% 100%   Examination: Physical Exam:  Constitutional: WN/WD chronically ill-appearing Caucasian male who appears to be doing okay.  Respiratory: Diminished to auscultation bilaterally, no wheezing, rales, rhonchi or crackles. Normal respiratory effort and patient is not tachypenic. No accessory  muscle use.  Unlabored breathing Cardiovascular: RRR, no murmurs / rubs / gallops. S1 and S2 auscultated.  Abdomen: Soft, non-tender, non-distended.  Bowel sounds positive.  GU: Deferred. Musculoskeletal: No clubbing / cyanosis of digits/nails.  Has a partial left foot amputation Skin: No rashes, lesions, ulcers on limited skin evaluation and left foot is wrapped. No induration; Warm and dry.  Neurologic: CN 2-12 grossly intact with no focal deficits. Romberg sign and cerebellar reflexes not assessed.  Psychiatric: Normal judgment and insight. Alert and oriented x 3. Normal mood and appropriate affect.   Condition at discharge: stable  The results of significant diagnostics from this hospitalization (including imaging, microbiology, ancillary and laboratory) are listed below for reference.   Imaging Studies: DG Foot Complete Left  Result Date: 11/17/2022 Please see detailed radiograph report in office note.  DG Foot Complete Left  Result Date: 11/16/2022 CLINICAL  DATA:  Status post amputation. EXAM: LEFT FOOT - COMPLETE 3+ VIEW COMPARISON:  Radiographs 11/08/2022 FINDINGS: Surgical changes from partial fifth metatarsal amputation. No complicating features are identified. IMPRESSION: Surgical changes from partial fifth metatarsal amputation. Electronically Signed   By: Rudie Meyer M.D.   On: 11/16/2022 11:22   MR FOOT LEFT W WO CONTRAST  Result Date: 11/13/2022 CLINICAL DATA:  Foot swelling, diabetic, osteomyelitis suspected, xray done EXAM: MRI OF THE LEFT FOREFOOT WITHOUT AND WITH CONTRAST TECHNIQUE: Multiplanar, multisequence MR imaging of the left forefoot was performed both before and after administration of intravenous contrast. CONTRAST:  7mL GADAVIST GADOBUTROL 1 MMOL/ML IV SOLN COMPARISON:  X-ray 11/08/2022.  MRI 10/30/2022 FINDINGS: Bones/Joint/Cartilage Postsurgical changes following resection of the distal aspect of the fifth metatarsal to the level of the proximal diaphysis. There is bone marrow edema and enhancement with intermediate to low T1 signal within the residual fifth metatarsal extending to the level of the proximal metaphysis. Mild bone marrow edema extends to the base of the fifth metatarsal (series 6, images 8-9). Bone marrow edema is present throughout the proximal phalanx of the fifth toe without associated low T1 bone marrow signal. Remaining bony structures of the forefoot demonstrate preserved bone marrow signal without fracture, dislocation, or marrow replacement. Similar mild degenerative changes. Ligaments Intact Lisfranc ligament.  No collateral ligament injury. Muscles and Tendons Diffuse edema-like signal of the foot musculature which may represent changes related to denervation and/or myositis. No tenosynovitis. Soft tissues Soft tissue wound or ulceration at the lateral forefoot at the surgical site. Partially rim enhancing fluid and air collection within the lateral forefoot measuring approximately 6.2 x 1.2 x 3.0 cm. IMPRESSION:  1. 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 Acute osteomyelitis within the residual fifth metatarsal, as described. 2. Bone marrow edema is present throughout the proximal phalanx of the fifth toe is favored to represent early acute osteomyelitis. 3. Soft tissue wound or ulceration at the lateral forefoot at the surgical site. Partially rim enhancing fluid and air collection within the lateral forefoot measuring approximately 6.2 x 1.2 x 3.0 cm. Findings are most compatible with phlegmon/developing abscess. Electronically Signed   By: Duanne Guess D.O.   On: 11/13/2022 11:11   DG Foot 2 Views Left  Result Date: 11/08/2022 CLINICAL DATA:  Recent I and D, initial encounter EXAM: LEFT FOOT - 2 VIEW COMPARISON:  11/07/2022 FINDINGS: Interval surgical excision of the fifth metatarsal is seen. No erosive changes to suggest osteomyelitis are noted. The previously seen soft tissue wound has been excised. Degenerative changes of the tarsal bones are noted. Calcaneal  spurring is seen. IMPRESSION: Postsurgical changes without acute bony abnormality. Electronically Signed   By: Alcide Clever M.D.   On: 11/08/2022 20:01   DG Foot Complete Left  Result Date: 11/07/2022 CLINICAL DATA:  Osteomyelitis of foot EXAM: LEFT FOOT - COMPLETE 3+ VIEW COMPARISON:  10/31/2022, 10/30/2022 FINDINGS: Resection of distal fifth metatarsal. Stable alignment fifth digit. Cut margin remains smooth. No osseous destructive change. Increased air within the soft tissues at the surgical site. IMPRESSION: Status post resection of distal fifth metatarsal. Increased air collection within the soft tissues at the surgical site. Electronically Signed   By: Jasmine Pang M.D.   On: 11/07/2022 21:07   DG Chest Port 1 View  Result Date: 11/04/2022 CLINICAL DATA:  Sepsis EXAM: PORTABLE CHEST 1 VIEW COMPARISON:  04/05/2022 FINDINGS: The heart size and mediastinal contours are within normal limits. Prior sternotomy  and CABG. Both lungs are clear. The visualized skeletal structures are unremarkable. IMPRESSION: No active disease. Electronically Signed   By: Duanne Guess D.O.   On: 11/04/2022 14:57   PERIPHERAL VASCULAR CATHETERIZATION  Result Date: 11/03/2022 Images from the original result were not included.   Patient name: Kenneth Bailey  MRN: [id number]        DOB: Jan 10, 1966            Sex: male  11/03/2022 Pre-operative Diagnosis: Critical limb ischemia of the left lower extremity with tissue loss Post-operative diagnosis:  Same Surgeon:  Cephus Shelling, MD Procedure Performed: 1.  Ultrasound-guided access right common femoral artery 2.  Aortogram with catheter selection of aorta 3.  Left lower extremity arteriogram with selection of third order branches including the left below-knee popliteal artery 4.  Ultrasound-guided access left posterior tibial artery at the ankle retrograde with pedal sheath placement 5.  Left posterior tibial artery and tibioperoneal trunk angioplasty (3 mm x 220 mm Sterling) 6.  Mynx closure of the right common femoral artery 7.  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  He presents today for an angiogram with a focus on the left leg with possible intervention after risk benefits discussed.  Findings:  Ultrasound-guided access right common femoral artery.  Aortogram showed both renals were patent as well as his infrarenal aorta and both iliac arteries were patent.  On the left he had a patent common femoral and profunda.  The SFA has some diffuse disease without flow-limiting stenosis and is patent.  The above and below-knee popliteal arteries patent.  Tibial trifurcation is occluded.  Dominant runoff was through a diseased peroneal that reconstituted distally and was occluded proximally with a collateral filling the posterior tibial at the ankle.   Initially tried to go antegrade and I could not get through his chronic total occlusion in the tibial trifurcation focusing on the peroneal.  I then elected to stick posterior tibial retrograde at the ankle.  Then I was able to get through his chronic total occlusion in the posterior tibial retrograde and snare my wire for through and through access.  The entire tibioperoneal trunk and posterior tibial artery was then angioplastied with a 3 mm x 220 mm Sterling.  I did go down and angioplastied the PT at the ankle with a 2 mm Sterling antegrade at completion.  Widely patent vessel at completion.  He now has inline flow through the posterior tibial artery.  Procedure:  The patient was identified in the holding area and taken to room 8.  The patient was then placed supine on the table and prepped and draped in the usual sterile fashion.  A time out was called.  The patient received Versed and fentanyl for conscious moderate sedation.  Vital signs were monitored including heart rate, respiratory rate, oxygenation and blood pressure.  I was present for all of moderate sedation.  Ultrasound was used to evaluate the right common femoral artery.  It was patent .  A digital ultrasound image was acquired.  A micropuncture needle was used to access the right common femoral artery under ultrasound guidance.  An 018 wire was advanced without resistance and a micropuncture sheath was placed.  The 018 wire was removed and a benson wire was placed.  The micropuncture sheath was exchanged for a 5 french sheath.  An omniflush catheter was advanced over the wire to the level of L-1.  An abdominal angiogram was obtained.  Next, using the omniflush catheter and a benson wire, the aortic bifurcation was crossed and the catheter was placed into theleft external iliac artery and left runoff was obtained.  Pertinent findings are noted above.  We elected to try antegrade intervention on his tibial disease.  I used a Glidewire  advantage down the left SFA and exchanged for a long 5 French sheath in the right groin with a catapault sheath over the aortic bifurcation into the left SFA.  Patient was given 100 units/kg IV heparin.  I then went down with a V18 wire and a quick cross catheter and got into the below-knee popliteal artery.  I did hand injections here to identify the reconstitution of the peroneal distally and triued to get down the tibioperoneal trunk.  I could not get antegrade.  Eventually I did not think an antegrade attempt was going to be successful.  I then evaluated the posterior tibial at the ankle where it reconstituted and this was accessed under ultrasound guidance with a micro access needle placed a microsheath and then used to CXI catheter with a V18 to come retrograde and got through the long segment posterior tibial occlusion.  I did snare my wire from the sheath in the right groin over the aortic bifurcation as through and through access.  The entire TP trunk and posterior tibial artery was then angioplastied with a long 3 mm x 220 mm Sterling for 2 minutes from an antegrade approach.  Final imaging showed excellent flow down to the ankle where there appeared to be residual stenosis.  I then pulled the pedal sheath and got my wire down into the plantar arch and then I angioplastied across the posterior tibial at the ankle with a 2 mm Sterling.  We gave nitro.  We then had widely patent vessel with inline flow down the foot.  Wires and catheters removed.  Mynx closure in the right groin.  Plan: Patient now has inline flow down the left lower extremity through the posterior tibial after retrograde access.  Optimized from vascular surgery standpoint.  Aspirin Plavix.  Statin allergy.    Cephus Shelling, MD [facility] Office: [phone]   DG Foot Complete Left  Result Date: 10/31/2022 CLINICAL DATA:  Osteomyelitis of left foot. EXAM: LEFT FOOT - COMPLETE 3+ VIEW COMPARISON:   Radiograph yesterday FINDINGS: Interval resection of the distal fifth metatarsal. The resection margin is smooth. The fifth toe remains in place. Expected postsurgical change in the soft  tissues. The exam is otherwise unchanged. IMPRESSION: Interval resection of the distal fifth metatarsal. Electronically Signed   By: Narda Rutherford M.D.   On: 10/31/2022 18:38   VAS [place] ABI WITH/WO TBI  Result Date: 10/31/2022  LOWER EXTREMITY DOPPLER STUDY Patient Name:  Kenneth Bailey  Date of Exam:   10/31/2022 Medical Rec #: [id number]            Accession #:    [id number] Date of Birth: Jul 24, 1965             Patient Gender: M Patient Age:   89 years Exam Location:  [facility] Procedure:      VAS [place] ABI WITH/WO TBI Referring Phys: REBECCA SIKORA --------------------------------------------------------------------------------  Indications: Ulceration, and peripheral artery disease. High Risk Factors: Hypertension, hyperlipidemia, Diabetes, current smoker.  Comparison Study: Previous 06/07/21 abnormal. Performing Technologist: McKayla Maag RVT, [place]  Examination Guidelines: A complete evaluation includes at minimum, Doppler waveform signals and systolic blood pressure reading at the level of bilateral brachial, anterior tibial, and posterior tibial arteries, when vessel segments are accessible. Bilateral testing is considered an integral part of a complete examination. Photoelectric Plethysmograph (PPG) waveforms and toe systolic pressure readings are included as required and additional duplex testing as needed. Limited examinations for reoccurring indications may be performed as noted.  ABI Findings: +---------+------------------+-----+----------+--------+ Right    Rt Pressure (mmHg)IndexWaveform  Comment  +---------+------------------+-----+----------+--------+ Brachial 152                    biphasic           +---------+------------------+-----+----------+--------+ PTA      91                0.60  monophasic         +---------+------------------+-----+----------+--------+ DP       96                0.63 monophasic         +---------+------------------+-----+----------+--------+ Great Toe39                0.26 Abnormal           +---------+------------------+-----+----------+--------+ +---------+------------------+-----+-------------------+-----------------------+ Left     Lt Pressure (mmHg)IndexWaveform           Comment                 +---------+------------------+-----+-------------------+-----------------------+ Brachial                        biphasic           No pressure obtained                                                       due to IV placement     +---------+------------------+-----+-------------------+-----------------------+ PTA      91                0.60 monophasic                                 +---------+------------------+-----+-------------------+-----------------------+ DP       74                0.49 dampened monophasic                        +---------+------------------+-----+-------------------+-----------------------+  Great Toe                       Absent                                     +---------+------------------+-----+-------------------+-----------------------+ +-------+-----------+-----------+------------+------------+ ABI/TBIToday's ABIToday's TBIPrevious ABIPrevious TBI +-------+-----------+-----------+------------+------------+ Right  0.63       0.26       0.59        0.22         +-------+-----------+-----------+------------+------------+ Left   0.60       Absent     0.74        0.38         +-------+-----------+-----------+------------+------------+ Right ABIs and TBIs appear essentially unchanged. Left ABIs and TBIs appear decreased.  Summary: Right: Resting right ankle-brachial index indicates moderate right lower extremity arterial disease. The right toe-brachial index is abnormal. Left:  Resting left ankle-brachial index indicates moderate left lower extremity arterial disease. *See table(s) above for measurements and observations.  Electronically signed by Coral Else MD on 10/31/2022 at 5:48:53 PM.    Final    MR FOOT LEFT W WO CONTRAST  Result Date: 10/31/2022 CLINICAL DATA:  Left foot infection. EXAM: MRI OF THE LEFT FOREFOOT WITHOUT AND WITH CONTRAST TECHNIQUE: Multiplanar, multisequence MR imaging of the left forefoot was performed both before and after administration of intravenous contrast. CONTRAST:  7mL GADAVIST GADOBUTROL 1 MMOL/ML IV SOLN COMPARISON:  Left foot x-rays from same day. FINDINGS: Bones/Joint/Cartilage Mild marrow edema and enhancement involving the fifth metatarsal head, early loss of the normal T1 marrow signal, consistent with osteomyelitis. No fracture or dislocation. Mild osteoarthritis of the first MTP joint. No joint effusion. Ligaments Collateral ligaments are intact. Muscles and Tendons Flexor and extensor tendons are intact. No tenosynovitis. Soft tissue Superficial ulceration at the lateral plantar aspect of the fifth metatarsal head with underlying lobulated T2 hyperintense, T1 isointense signal without rim enhancement, measuring approximately 4.7 x 0.7 x 2.1 cm. No soft tissue mass. IMPRESSION: 1. Superficial ulceration at the lateral plantar aspect of the fifth metatarsal head with underlying early osteomyelitis of the fifth metatarsal head and 4.7 x 0.7 x 2.1 cm phlegmon/early abscess. Electronically Signed   By: Obie Dredge M.D.   On: 10/31/2022 09:55    Microbiology: Results for orders placed or performed during the hospital encounter of 10/30/22  Blood culture (routine x 2)     Status: None   Collection Time: 10/30/22  4:58 PM   Specimen: BLOOD  Result Value Ref Range Status   Specimen Description BLOOD LEFT FOREARM  Final   Special Requests   Final    BOTTLES DRAWN AEROBIC AND ANAEROBIC Blood Culture adequate volume   Culture   Final     NO GROWTH 5 DAYS Performed at [facility], [address] [place], [place] [address]    Report Status 11/04/2022 FINAL  Final  Blood culture (routine x 2)     Status: None   Collection Time: 10/30/22  5:11 PM   Specimen: BLOOD  Result Value Ref Range Status   Specimen Description BLOOD RIGHT ANTECUBITAL  Final   Special Requests   Final    BOTTLES DRAWN AEROBIC AND ANAEROBIC Blood Culture results may not be optimal due to an excessive volume of blood received in culture bottles   Culture   Final    NO GROWTH 5  DAYS Performed at [facility], [address], [place], [place] [address]    Report Status 11/04/2022 FINAL  Final  MRSA Next Gen by PCR, Nasal     Status: None   Collection Time: 10/31/22  3:44 AM   Specimen: Nasal Mucosa; Nasal Swab  Result Value Ref Range Status   MRSA by PCR Next Gen NOT DETECTED NOT DETECTED Final    Comment: (NOTE) The GeneXpert MRSA Assay (FDA approved for NASAL specimens only), is one component of a comprehensive MRSA colonization surveillance program. It is not intended to diagnose MRSA infection nor to guide or monitor treatment for MRSA infections. Test performance is not FDA approved in patients less than 52 years old. Performed at [facility], [address], [place], [place] [address]   Aerobic/Anaerobic Culture w Gram Stain (surgical/deep wound)     Status: None   Collection Time: 10/31/22  1:24 PM   Specimen: Toe, Left; Amputation  Result Value Ref Range Status   Specimen Description WOUND LEFT TOE  Final   Special Requests 5TH METATARSAL HEAD PT ON VANC CEFEPIME  Final   Gram Stain   Final    NO WBC SEEN RARE GRAM POSITIVE COCCI IN PAIRS RARE GRAM NEGATIVE RODS    Culture   Final    FEW METHICILLIN RESISTANT STAPHYLOCOCCUS AUREUS NO ANAEROBES ISOLATED Performed at [facility], [address] [place], [place] [address]    Report Status 11/05/2022 FINAL  Final   Organism ID, Bacteria METHICILLIN  RESISTANT STAPHYLOCOCCUS AUREUS  Final      Susceptibility   Methicillin resistant staphylococcus aureus - MIC*    CIPROFLOXACIN >=8 RESISTANT Resistant     ERYTHROMYCIN >=8 RESISTANT Resistant     GENTAMICIN <=0.5 SENSITIVE Sensitive     OXACILLIN >=4 RESISTANT Resistant     TETRACYCLINE <=1 SENSITIVE Sensitive     VANCOMYCIN <=0.5 SENSITIVE Sensitive     TRIMETH/SULFA 160 RESISTANT Resistant     CLINDAMYCIN >=8 RESISTANT Resistant     RIFAMPIN <=0.5 SENSITIVE Sensitive     Inducible Clindamycin NEGATIVE Sensitive     LINEZOLID 2 SENSITIVE Sensitive     * FEW METHICILLIN RESISTANT STAPHYLOCOCCUS AUREUS  Surgical pcr screen     Status: None   Collection Time: 11/03/22  7:07 AM   Specimen: Nasal Mucosa; Nasal Swab  Result Value Ref Range Status   MRSA, PCR NEGATIVE NEGATIVE Final   Staphylococcus aureus NEGATIVE NEGATIVE Final    Comment: (NOTE) The Xpert SA Assay (FDA approved for NASAL specimens in patients 48 years of age and older), is one component of a comprehensive surveillance program. It is not intended to diagnose infection nor to guide or monitor treatment. Performed at [facility], [address], [place], [place] [address]   Culture, blood (x 2)     Status: None   Collection Time: 11/04/22  2:50 PM   Specimen: BLOOD  Result Value Ref Range Status   Specimen Description BLOOD SITE NOT SPECIFIED  Final   Special Requests   Final    BOTTLES DRAWN AEROBIC AND ANAEROBIC Blood Culture results may not be optimal due to an inadequate volume of blood received in culture bottles   Culture   Final    NO GROWTH 5 DAYS Performed at [facility], [address] [place], [place] [address]    Report Status 11/09/2022 FINAL  Final  Culture, blood (x 2)     Status: None  Collection Time: 11/04/22  2:54 PM   Specimen: BLOOD  Result Value Ref Range Status   Specimen Description BLOOD BLOOD RIGHT HAND  Final   Special Requests   Final    BOTTLES DRAWN AEROBIC AND  ANAEROBIC Blood Culture results may not be optimal due to an inadequate volume of blood received in culture bottles   Culture   Final    NO GROWTH 5 DAYS Performed at [facility], [address] [place], [place] [address]    Report Status 11/09/2022 FINAL  Final  Aerobic/Anaerobic Culture w Gram Stain (surgical/deep wound)     Status: None   Collection Time: 11/08/22  6:22 PM   Specimen: Foot, Left; Tissue  Result Value Ref Range Status   Specimen Description ABSCESS LEFT FOOT  Final   Special Requests SWABS SAMPLE A  Final   Gram Stain   Final    ABUNDANT WBC PRESENT, PREDOMINANTLY PMN RARE GRAM POSITIVE COCCI    Culture   Final    MODERATE METHICILLIN RESISTANT STAPHYLOCOCCUS AUREUS NO ANAEROBES ISOLATED Performed at [facility], [address], [place], [place] [address]    Report Status 11/13/2022 FINAL  Final   Organism ID, Bacteria METHICILLIN RESISTANT STAPHYLOCOCCUS AUREUS  Final      Susceptibility   Methicillin resistant staphylococcus aureus - MIC*    CIPROFLOXACIN >=8 RESISTANT Resistant     ERYTHROMYCIN >=8 RESISTANT Resistant     GENTAMICIN <=0.5 SENSITIVE Sensitive     OXACILLIN >=4 RESISTANT Resistant     TETRACYCLINE <=1 SENSITIVE Sensitive     VANCOMYCIN 1 SENSITIVE Sensitive     TRIMETH/SULFA >=320 RESISTANT Resistant     CLINDAMYCIN >=8 RESISTANT Resistant     RIFAMPIN <=0.5 SENSITIVE Sensitive     Inducible Clindamycin NEGATIVE Sensitive     LINEZOLID 2 SENSITIVE Sensitive     * MODERATE METHICILLIN RESISTANT STAPHYLOCOCCUS AUREUS  Aerobic/Anaerobic Culture w Gram Stain (surgical/deep wound)     Status: None   Collection Time: 11/08/22  6:23 PM   Specimen: Foot, Left; Tissue  Result Value Ref Range Status   Specimen Description BONE  Final   Special Requests 5TH  METATARSAL  Final   Gram Stain NO WBC SEEN NO ORGANISMS SEEN   Final   Culture   Final    RARE METHICILLIN RESISTANT STAPHYLOCOCCUS AUREUS RARE ENTEROCOCCUS  FAECALIS CRITICAL RESULT CALLED TO, READ BACK BY AND VERIFIED WITH: Alene Mires [id number] AT 1232 BY EC NO ANAEROBES ISOLATED Performed at [facility], [address], [place], [place] [address]    Report Status 11/13/2022 FINAL  Final   Organism ID, Bacteria METHICILLIN RESISTANT STAPHYLOCOCCUS AUREUS  Final   Organism ID, Bacteria ENTEROCOCCUS FAECALIS  Final      Susceptibility   Enterococcus faecalis - MIC*    AMPICILLIN <=2 SENSITIVE Sensitive     VANCOMYCIN 2 SENSITIVE Sensitive     GENTAMICIN SYNERGY SENSITIVE Sensitive     * RARE ENTEROCOCCUS FAECALIS   Methicillin resistant staphylococcus aureus - MIC*    CIPROFLOXACIN >=8 RESISTANT Resistant     ERYTHROMYCIN >=8 RESISTANT Resistant     GENTAMICIN <=0.5 SENSITIVE Sensitive     OXACILLIN >=4 RESISTANT Resistant     TETRACYCLINE <=1 SENSITIVE Sensitive     VANCOMYCIN 1 SENSITIVE Sensitive     TRIMETH/SULFA >=320 RESISTANT Resistant     CLINDAMYCIN >=8 RESISTANT Resistant     RIFAMPIN <=0.5 SENSITIVE Sensitive     Inducible Clindamycin NEGATIVE  Sensitive     LINEZOLID 2 SENSITIVE Sensitive     * RARE METHICILLIN RESISTANT STAPHYLOCOCCUS AUREUS   Labs: CBC: Recent Labs  Lab 11/13/22 0704 11/14/22 0800 11/15/22 0048 11/16/22 0433 11/17/22 1045  WBC 11.7* 10.3 11.8* 11.8* 9.9  NEUTROABS  --  7.2 7.9* 9.8* 7.1  HGB 9.0* 8.2* 8.5* 8.3* 8.8*  HCT 26.8* 25.2* 26.0* 25.1* 26.5*  MCV 87.6 85.4 85.8 85.4 84.4  PLT 443* 444* 461* 430* 414*   Basic Metabolic Panel: Recent Labs  Lab 11/13/22 0704 11/14/22 0800 11/15/22 0048 11/16/22 0433 11/17/22 1045  NA 133* 133* 130* 131* 130*  K 4.5 4.5 4.3 5.3* 4.5  CL 98 99 96* 97* 94*  CO2 24 24 23 25 24   GLUCOSE 213* 189* 273* 247* 209*  BUN 34* 31* 32* 31* 34*  CREATININE 1.31* 1.17 1.32* 1.21 1.33*  CALCIUM 9.6 9.3 9.0 9.4 9.3  MG 2.0  --   --  1.9 1.7  PHOS 3.8  --   --  4.9* 4.9*   Liver Function Tests: Recent Labs  Lab 11/16/22 0433 11/17/22 1045  AST  16 17  ALT 12 12  ALKPHOS 93 110  BILITOT 0.3 0.4  PROT 7.1 7.2  ALBUMIN 2.8* 2.9*   CBG: Recent Labs  Lab 11/16/22 2122 11/16/22 2200 11/17/22 0806 11/17/22 1139 11/17/22 1639  GLUCAP 206* 215* 107* 195* 189*   Discharge time spent: greater than 30 minutes.  Signed: Marguerita Merles, DO [facility] 11/19/2022

## 2022-11-17 NOTE — Progress Notes (Signed)
MD on call made aware that the patient needs more pain medication.

## 2022-11-17 NOTE — Progress Notes (Signed)
Physical Therapy Treatment Patient Details Name: Kenneth Bailey MRN: [id number] DOB: 13-Dec-1965 Today's Date: 11/17/2022   History of Present Illness Pt is a 57 y.o. male admitted on 10/30/2022 with osteomyelitis. He is now s/p Left foot irrigation and debridement with resection of fifth metatarsal head 11/01/22. Pt underwent repeat I&D on 11/08/22. Past medical history significant for uncontrolled type 2 diabetes, diabetic polyneuropathy, coronary artery disease status post CABG, chronic combined diastolic and systolic CHF, current smoker,    PT Comments    Pt was seen for anticipated last visit, and talked with him about the MD expectations for tx.  Refuses to use any ortho device to protect his LLE, and declines to try steps.  Talked with him about rationale for protecting L foot, but declines.  Walked with [facility]B, and discussed his needs for avoiding pain on LLE given his choice is home.  Pt agreed to reconsider walking device to rollator given his use of L heel regardless of PT recommendations.  wWill encourage the walker to give LLE rest.  Follow up with home health PT hopefully to give pt a chance to  problem solve his limited space and LLE needs.  Recommendations for follow up therapy are one component of a multi-disciplinary discharge planning process, led by the attending physician.  Recommendations may be updated based on patient status, additional functional criteria and insurance authorization.  Follow Up Recommendations  Can patient physically be transported by private vehicle: Yes    Assistance Recommended at Discharge Frequent or constant Supervision/Assistance  Patient can return home with the following A little help with walking and/or transfers;A little help with bathing/dressing/bathroom;Assistance with cooking/housework;Assist for transportation;Help with stairs or ramp for entrance   Equipment Recommendations  Rollator (4 wheels)    Recommendations for Other Services        Precautions / Restrictions Precautions Precautions: Fall Precaution Comments: pain management Required Braces or Orthoses: Other Brace Other Brace: fore foot off weighting shoe Restrictions Weight Bearing Restrictions: Yes LLE Weight Bearing: Weight bearing as tolerated LLE Partial Weight Bearing Percentage or Pounds: walk on heel     Mobility  Bed Mobility Overal bed mobility: Modified Independent                  Transfers Overall transfer level: Needs assistance Equipment used: Rolling walker (2 wheels) Transfers: Sit to/from Stand Sit to Stand: Supervision           General transfer comment: refusing shoe    Ambulation/Gait Ambulation/Gait assistance: Supervision, Min guard Gait Distance (Feet): 80 Feet Assistive device: Rolling walker (2 wheels)   Gait velocity: reduced Gait velocity interpretation: <1.31 ft/sec, indicative of household ambulator Pre-gait activities: standing balance ck General Gait Details: hopping on RLE to avoid any weight bearing on LLE   Stairs Stairs:  (refused to practice mult requests to try them)           Wheelchair Mobility    Modified Rankin (Stroke Patients Only)       Balance Overall balance assessment: Needs assistance Sitting-balance support: Feet supported Sitting balance-Leahy Scale: Good Sitting balance - Comments: on side of bed   Standing balance support: Bilateral upper extremity supported, During functional activity, Reliant on assistive device for balance Standing balance-Leahy Scale: Poor Standing balance comment: RW in standing, adjusted height for ease of offloading LLE                            Cognition Arousal/Alertness:  Awake/alert Behavior During Therapy: Impulsive Overall Cognitive Status: Impaired/Different from baseline Area of Impairment: Problem solving, Following commands, Safety/judgement                       Following Commands: Follows one step  commands with increased time Safety/Judgement: Decreased awareness of safety, Decreased awareness of deficits   Problem Solving: Requires verbal cues, Slow processing General Comments: refuses all walking devices        Exercises      General Comments General comments (skin integrity, edema, etc.): Pt was seen for managing gait and discussion of logistics of home.  Pt refused to practice steps but discussed the permission of using LLE and his declining of shoes for support/protection of L foot despite PT talking about the reasons for them      Pertinent Vitals/Pain Pain Assessment Pain Assessment: Faces Faces Pain Scale: Hurts even more Pain Location: L foot Pain Descriptors / Indicators: Aching, Guarding, Grimacing Pain Intervention(s): Limited activity within patient's tolerance, Monitored during session, Premedicated before session, Repositioned    Home Living                          Prior Function            PT Goals (current goals can now be found in the care plan section) Acute Rehab PT Goals Patient Stated Goal: to go home soon Progress towards PT goals: Progressing toward goals    Frequency    Min 3X/week      PT Plan Equipment recommendations need to be updated    Co-evaluation              AM-PAC PT "6 Clicks" Mobility   Outcome Measure  Help needed turning from your back to your side while in a flat bed without using bedrails?: None Help needed moving from lying on your back to sitting on the side of a flat bed without using bedrails?: None Help needed moving to and from a bed to a chair (including a wheelchair)?: A Little Help needed standing up from a chair using your arms (e.g., wheelchair or bedside chair)?: A Little Help needed to walk in hospital room?: A Little Help needed climbing 3-5 steps with a railing? : Total 6 Click Score: 18    End of Session Equipment Utilized During Treatment: Gait belt Activity Tolerance:  Patient tolerated treatment well Patient left: in bed;with call bell/phone within reach Nurse Communication: Mobility status PT Visit Diagnosis: Other abnormalities of gait and mobility (R26.89);Pain Pain - Right/Left: Left Pain - part of body: Ankle and joints of foot     Time: 4098-1191 PT Time Calculation (min) (ACUTE ONLY): 28 min  Charges:  $Gait Training: 8-22 mins $Therapeutic Activity: 8-22 mins     Ivar Drape 11/17/2022, 2:38 PM  Samul Dada, PT PhD Acute Rehab Dept. Number: [facility] [id number] and [facility] [phone]

## 2022-11-20 ENCOUNTER — Other Ambulatory Visit (HOSPITAL_COMMUNITY): Payer: Self-pay

## 2022-11-21 ENCOUNTER — Ambulatory Visit (INDEPENDENT_AMBULATORY_CARE_PROVIDER_SITE_OTHER): Payer: BC Managed Care – PPO | Admitting: Podiatry

## 2022-11-21 ENCOUNTER — Ambulatory Visit: Payer: BC Managed Care – PPO

## 2022-11-21 DIAGNOSIS — Z9889 Other specified postprocedural states: Secondary | ICD-10-CM

## 2022-11-21 DIAGNOSIS — M86672 Other chronic osteomyelitis, left ankle and foot: Secondary | ICD-10-CM

## 2022-11-21 NOTE — Addendum Note (Signed)
Addended by: Mindi Curling on: 11/21/2022 04:40 PM   Modules accepted: Orders

## 2022-11-21 NOTE — Progress Notes (Signed)
  Subjective:  Patient ID: Kenneth Bailey, male    DOB: 1965-07-03,  MRN: [id number]  Chief Complaint  Patient presents with   Routine Post Op    DOS 11/15/2022   POV#2 Amputation Fith Ray Left Foot - Stitches are intact. Patient rated pain a 4 out of 10. Patient continues to take antibiotics as ordered.     DOS: 11/15/2022 Procedure: Partial fifth ray amputation left foot initially performed by Dr. Ralene Cork and then completed by Dr. Loreta Ave and revision surgery completed by Dr. Logan Bores with total amputation of the fifth ray.  57 y.o. male returns for post-op check.  Patient states he has been doing well he says his pain is controlled.  He has pain medication at home available.  He is continuing to take antibiotics orally.  Denies any drainage from the incision site.  He has kept dressing clean dry and intact since surgery.  He is wearing a postoperative shoe and has been minimally weightbearing to the heel only.  Review of Systems: Negative except as noted in the HPI. Denies N/V/F/Ch.   Objective:  There were no vitals filed for this visit. There is no height or weight on file to calculate BMI. Constitutional Well developed. Well nourished.  Vascular Foot warm and well perfused. Capillary refill normal to all digits.  Calf is soft and supple, no posterior calf or knee pain, negative Homans' sign  Neurologic Normal speech. Oriented to person, place, and time. Epicritic sensation to light touch grossly present bilaterally.  Dermatologic Skin healing well without signs of infection. Skin edges well coapted without signs of infection.   Orthopedic: Tenderness to palpation noted about the surgical site.   Multiple view plain film radiographs: Deferred at this visit Assessment:   1. 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 Post-operative state    Plan:  Patient was evaluated and treated and all questions answered.  S/p foot surgery left -Progressing as expected  post-operatively. -XR: Deferred -WB Status: Limited weightbearing as tolerated to heal in postop shoe -Sutures: Staples to remain intact until next visit in 2 weeks. -Medications: Continue p.o. antibiotics until gone -Foot redressed.  Recommend every 3 to 4-day Betadine wet-to-dry dressing over the incision site provided patient supplies.  Return in about 2 weeks (around 12/05/2022) for 2nd POV L foot 5th ray amp.         [name], DPM [facility] / [facility]

## 2022-11-23 ENCOUNTER — Other Ambulatory Visit: Payer: Self-pay

## 2022-11-23 ENCOUNTER — Inpatient Hospital Stay (HOSPITAL_COMMUNITY)
Admission: EM | Admit: 2022-11-23 | Discharge: 2022-11-27 | DRG: 378 | Disposition: A | Discharge status: Home / Self Care | Payer: BC Managed Care – PPO | Attending: Internal Medicine | Admitting: Internal Medicine

## 2022-11-23 ENCOUNTER — Encounter (HOSPITAL_COMMUNITY): Payer: Self-pay

## 2022-11-23 DIAGNOSIS — K922 Gastrointestinal hemorrhage, unspecified: Secondary | ICD-10-CM | POA: Diagnosis not present

## 2022-11-23 DIAGNOSIS — I5042 Chronic combined systolic (congestive) and diastolic (congestive) heart failure: Secondary | ICD-10-CM | POA: Diagnosis present

## 2022-11-23 DIAGNOSIS — I2510 Atherosclerotic heart disease of native coronary artery without angina pectoris: Secondary | ICD-10-CM | POA: Diagnosis present

## 2022-11-23 DIAGNOSIS — B9562 Methicillin resistant Staphylococcus aureus infection as the cause of diseases classified elsewhere: Secondary | ICD-10-CM | POA: Diagnosis present

## 2022-11-23 DIAGNOSIS — K2970 Gastritis, unspecified, without bleeding: Secondary | ICD-10-CM | POA: Diagnosis present

## 2022-11-23 DIAGNOSIS — K264 Chronic or unspecified duodenal ulcer with hemorrhage: Principal | ICD-10-CM | POA: Diagnosis present

## 2022-11-23 DIAGNOSIS — Z89422 Acquired absence of other left toe(s): Secondary | ICD-10-CM

## 2022-11-23 DIAGNOSIS — E1169 Type 2 diabetes mellitus with other specified complication: Secondary | ICD-10-CM | POA: Diagnosis present

## 2022-11-23 DIAGNOSIS — K3189 Other diseases of stomach and duodenum: Secondary | ICD-10-CM | POA: Diagnosis present

## 2022-11-23 DIAGNOSIS — D649 Anemia, unspecified: Secondary | ICD-10-CM | POA: Diagnosis not present

## 2022-11-23 DIAGNOSIS — I5022 Chronic systolic (congestive) heart failure: Secondary | ICD-10-CM | POA: Diagnosis not present

## 2022-11-23 DIAGNOSIS — Z7902 Long term (current) use of antithrombotics/antiplatelets: Secondary | ICD-10-CM

## 2022-11-23 DIAGNOSIS — J449 Chronic obstructive pulmonary disease, unspecified: Secondary | ICD-10-CM | POA: Diagnosis present

## 2022-11-23 DIAGNOSIS — E785 Hyperlipidemia, unspecified: Secondary | ICD-10-CM | POA: Diagnosis present

## 2022-11-23 DIAGNOSIS — F17210 Nicotine dependence, cigarettes, uncomplicated: Secondary | ICD-10-CM | POA: Diagnosis present

## 2022-11-23 DIAGNOSIS — D509 Iron deficiency anemia, unspecified: Secondary | ICD-10-CM | POA: Diagnosis not present

## 2022-11-23 DIAGNOSIS — K921 Melena: Secondary | ICD-10-CM | POA: Diagnosis present

## 2022-11-23 DIAGNOSIS — D62 Acute posthemorrhagic anemia: Secondary | ICD-10-CM | POA: Diagnosis present

## 2022-11-23 DIAGNOSIS — Z79899 Other long term (current) drug therapy: Secondary | ICD-10-CM

## 2022-11-23 DIAGNOSIS — E1159 Type 2 diabetes mellitus with other circulatory complications: Secondary | ICD-10-CM | POA: Diagnosis not present

## 2022-11-23 DIAGNOSIS — K2289 Other specified disease of esophagus: Secondary | ICD-10-CM | POA: Diagnosis present

## 2022-11-23 DIAGNOSIS — Z7984 Long term (current) use of oral hypoglycemic drugs: Secondary | ICD-10-CM | POA: Diagnosis not present

## 2022-11-23 DIAGNOSIS — Z794 Long term (current) use of insulin: Secondary | ICD-10-CM

## 2022-11-23 DIAGNOSIS — K449 Diaphragmatic hernia without obstruction or gangrene: Secondary | ICD-10-CM | POA: Diagnosis present

## 2022-11-23 DIAGNOSIS — Z5986 Financial insecurity: Secondary | ICD-10-CM

## 2022-11-23 DIAGNOSIS — E119 Type 2 diabetes mellitus without complications: Secondary | ICD-10-CM

## 2022-11-23 DIAGNOSIS — M869 Osteomyelitis, unspecified: Secondary | ICD-10-CM | POA: Diagnosis present

## 2022-11-23 DIAGNOSIS — Z888 Allergy status to other drugs, medicaments and biological substances status: Secondary | ICD-10-CM

## 2022-11-23 DIAGNOSIS — E875 Hyperkalemia: Secondary | ICD-10-CM | POA: Diagnosis present

## 2022-11-23 DIAGNOSIS — K279 Peptic ulcer, site unspecified, unspecified as acute or chronic, without hemorrhage or perforation: Secondary | ICD-10-CM

## 2022-11-23 DIAGNOSIS — Z720 Tobacco use: Secondary | ICD-10-CM | POA: Diagnosis present

## 2022-11-23 DIAGNOSIS — K209 Esophagitis, unspecified without bleeding: Secondary | ICD-10-CM | POA: Diagnosis present

## 2022-11-23 DIAGNOSIS — I152 Hypertension secondary to endocrine disorders: Secondary | ICD-10-CM | POA: Diagnosis present

## 2022-11-23 DIAGNOSIS — Z7982 Long term (current) use of aspirin: Secondary | ICD-10-CM

## 2022-11-23 DIAGNOSIS — R195 Other fecal abnormalities: Secondary | ICD-10-CM

## 2022-11-23 DIAGNOSIS — Z951 Presence of aortocoronary bypass graft: Secondary | ICD-10-CM

## 2022-11-23 DIAGNOSIS — E1151 Type 2 diabetes mellitus with diabetic peripheral angiopathy without gangrene: Secondary | ICD-10-CM | POA: Diagnosis present

## 2022-11-23 DIAGNOSIS — Z8249 Family history of ischemic heart disease and other diseases of the circulatory system: Secondary | ICD-10-CM

## 2022-11-23 HISTORY — DX: Chronic systolic (congestive) heart failure: I50.22

## 2022-11-23 LAB — CBC
HCT: 29.7 % — ABNORMAL LOW (ref 39.0–52.0)
HCT: 25.3 % — ABNORMAL LOW (ref 39.0–52.0)
Hemoglobin: 9.5 g/dL — ABNORMAL LOW (ref 13.0–17.0)
Hemoglobin: 7.8 g/dL — ABNORMAL LOW (ref 13.0–17.0)
MCH: 29.1 pg (ref 26.0–34.0)
MCH: 28.4 pg (ref 26.0–34.0)
MCHC: 32 g/dL (ref 30.0–36.0)
MCHC: 30.8 g/dL (ref 30.0–36.0)
MCV: 90.8 fL (ref 80.0–100.0)
MCV: 92 fL (ref 80.0–100.0)
Platelets: 414 10*3/uL — ABNORMAL HIGH (ref 150–400)
Platelets: 326 10*3/uL (ref 150–400)
RBC: 3.27 MIL/uL — ABNORMAL LOW (ref 4.22–5.81)
RBC: 2.75 MIL/uL — ABNORMAL LOW (ref 4.22–5.81)
RDW: 14.8 % (ref 11.5–15.5)
RDW: 14.8 % (ref 11.5–15.5)
WBC: 6.9 10*3/uL (ref 4.0–10.5)
WBC: 6.6 10*3/uL (ref 4.0–10.5)
nRBC: 0 % (ref 0.0–0.2)
nRBC: 0 % (ref 0.0–0.2)

## 2022-11-23 LAB — GLUCOSE, CAPILLARY: Glucose-Capillary: 145 mg/dL — ABNORMAL HIGH (ref 70–99)

## 2022-11-23 LAB — COMPREHENSIVE METABOLIC PANEL
ALT: 11 U/L (ref 0–44)
AST: 15 U/L (ref 15–41)
Albumin: 3.1 g/dL — ABNORMAL LOW (ref 3.5–5.0)
Alkaline Phosphatase: 110 U/L (ref 38–126)
Anion gap: 14 (ref 5–15)
BUN: 33 mg/dL — ABNORMAL HIGH (ref 6–20)
CO2: 19 mmol/L — ABNORMAL LOW (ref 22–32)
Calcium: 9.6 mg/dL (ref 8.9–10.3)
Chloride: 107 mmol/L (ref 98–111)
Creatinine, Ser: 1.24 mg/dL (ref 0.61–1.24)
GFR, Estimated: >60 mL/min (ref >60)
Glucose, Bld: 150 mg/dL — ABNORMAL HIGH (ref 70–99)
Potassium: 4.6 mmol/L (ref 3.5–5.1)
Sodium: 140 mmol/L (ref 135–145)
Total Bilirubin: 0.4 mg/dL (ref 0.3–1.2)
Total Protein: 7.4 g/dL (ref 6.5–8.1)

## 2022-11-23 LAB — BPAM RBC: Blood Product Expiration Date: 202408062359

## 2022-11-23 LAB — TYPE AND SCREEN: Antibody Screen: NEGATIVE

## 2022-11-23 LAB — PREPARE RBC (CROSSMATCH)

## 2022-11-23 MED ORDER — MELATONIN 5 MG PO TABS
10.0000 mg | ORAL_TABLET | Freq: Every day | ORAL | Status: DC
  Administered 2022-11-23 – 2022-11-26 (×4): 10 mg via ORAL
  Filled 2022-11-23 (×4): qty 2

## 2022-11-23 MED ORDER — SODIUM CHLORIDE 0.9% FLUSH
3.0000 mL | Freq: Two times a day (BID) | INTRAVENOUS | Status: DC
  Administered 2022-11-23 – 2022-11-27 (×7): 3 mL via INTRAVENOUS

## 2022-11-23 MED ORDER — LACTATED RINGERS IV BOLUS
1000.0000 mL | Freq: Once | INTRAVENOUS | Status: AC
  Administered 2022-11-23: 1000 mL via INTRAVENOUS

## 2022-11-23 MED ORDER — MORPHINE SULFATE (PF) 2 MG/ML IV SOLN
2.0000 mg | INTRAVENOUS | Status: DC | PRN
  Administered 2022-11-23 – 2022-11-24 (×3): 2 mg via INTRAVENOUS
  Administered 2022-11-24 – 2022-11-25 (×6): 4 mg via INTRAVENOUS
  Administered 2022-11-25 – 2022-11-26 (×2): 2 mg via INTRAVENOUS
  Administered 2022-11-26 – 2022-11-27 (×4): 4 mg via INTRAVENOUS
  Filled 2022-11-23: qty 1
  Filled 2022-11-23: qty 2
  Filled 2022-11-23: qty 1
  Filled 2022-11-23 (×4): qty 2
  Filled 2022-11-23: qty 1
  Filled 2022-11-23: qty 2
  Filled 2022-11-23: qty 1
  Filled 2022-11-23 (×4): qty 2
  Filled 2022-11-23 (×2): qty 1

## 2022-11-23 MED ORDER — ALBUTEROL SULFATE (2.5 MG/3ML) 0.083% IN NEBU: 2.5000 mg | INHALATION_SOLUTION | Freq: Four times a day (QID) | RESPIRATORY_TRACT | Status: DC | PRN

## 2022-11-23 MED ORDER — ACETAMINOPHEN 650 MG RE SUPP: 650.0000 mg | Freq: Four times a day (QID) | RECTAL | Status: DC | PRN

## 2022-11-23 MED ORDER — DOXYCYCLINE HYCLATE 100 MG PO TABS
100.0000 mg | ORAL_TABLET | Freq: Two times a day (BID) | ORAL | Status: DC
  Administered 2022-11-23 – 2022-11-27 (×8): 100 mg via ORAL
  Filled 2022-11-23 (×8): qty 1

## 2022-11-23 MED ORDER — OXYCODONE HCL 5 MG PO TABS
5.0000 mg | ORAL_TABLET | Freq: Four times a day (QID) | ORAL | Status: DC | PRN
  Administered 2022-11-23 – 2022-11-27 (×10): 5 mg via ORAL
  Filled 2022-11-23 (×10): qty 1

## 2022-11-23 MED ORDER — ONDANSETRON HCL 4 MG/2ML IJ SOLN
4.0000 mg | Freq: Four times a day (QID) | INTRAMUSCULAR | Status: DC | PRN
  Administered 2022-11-24: 4 mg via INTRAVENOUS
  Filled 2022-11-23: qty 2

## 2022-11-23 MED ORDER — ACETAMINOPHEN 325 MG PO TABS: 650.0000 mg | ORAL_TABLET | Freq: Four times a day (QID) | ORAL | Status: DC | PRN

## 2022-11-23 MED ORDER — SODIUM CHLORIDE 0.9% IV SOLUTION: Freq: Once | INTRAVENOUS | Status: AC

## 2022-11-23 MED ORDER — NICOTINE 14 MG/24HR TD PT24
14.0000 mg | MEDICATED_PATCH | Freq: Every day | TRANSDERMAL | Status: DC
  Administered 2022-11-23 – 2022-11-24 (×2): 14 mg via TRANSDERMAL
  Filled 2022-11-23 (×2): qty 1

## 2022-11-23 MED ORDER — GEMFIBROZIL 600 MG PO TABS
600.0000 mg | ORAL_TABLET | Freq: Two times a day (BID) | ORAL | Status: DC
  Administered 2022-11-24 – 2022-11-27 (×6): 600 mg via ORAL
  Filled 2022-11-23 (×9): qty 1

## 2022-11-23 MED ORDER — INSULIN GLARGINE-YFGN 100 UNIT/ML ~~LOC~~ SOLN
10.0000 [IU] | Freq: Every day | SUBCUTANEOUS | Status: DC
  Administered 2022-11-24 – 2022-11-25 (×2): 10 [IU] via SUBCUTANEOUS
  Filled 2022-11-23 (×5): qty 0.1

## 2022-11-23 MED ORDER — GABAPENTIN 300 MG PO CAPS
600.0000 mg | ORAL_CAPSULE | Freq: Three times a day (TID) | ORAL | Status: DC
  Administered 2022-11-23 – 2022-11-27 (×11): 600 mg via ORAL
  Filled 2022-11-23 (×11): qty 2

## 2022-11-23 MED ORDER — PANTOPRAZOLE SODIUM 40 MG IV SOLR
40.0000 mg | Freq: Two times a day (BID) | INTRAVENOUS | Status: DC
  Administered 2022-11-23 – 2022-11-26 (×6): 40 mg via INTRAVENOUS
  Filled 2022-11-23 (×6): qty 10

## 2022-11-23 MED ORDER — INSULIN ASPART 100 UNIT/ML IJ SOLN
0.0000 [IU] | INTRAMUSCULAR | Status: DC
  Administered 2022-11-23: 2 [IU] via SUBCUTANEOUS
  Administered 2022-11-24: 3 [IU] via SUBCUTANEOUS
  Administered 2022-11-24: 15 [IU] via SUBCUTANEOUS

## 2022-11-23 MED ORDER — EZETIMIBE 10 MG PO TABS
10.0000 mg | ORAL_TABLET | Freq: Every day | ORAL | Status: DC
  Administered 2022-11-24 – 2022-11-27 (×4): 10 mg via ORAL
  Filled 2022-11-23 (×4): qty 1

## 2022-11-23 MED ORDER — ONDANSETRON HCL 4 MG PO TABS: 4.0000 mg | ORAL_TABLET | Freq: Four times a day (QID) | ORAL | Status: DC | PRN

## 2022-11-23 NOTE — H&P (Signed)
History and Physical    Kenneth Bailey UJW:[id number] DOB: 1966/04/10 DOA: 11/23/2022  PCP: [name], MD  Patient coming from: Home  I have personally briefly reviewed patient's old medical records in [facility]  Chief Complaint: Dark black stool  HPI: Kenneth Bailey is a 57 y.o. male with medical history significant for CAD s/p CABG, chronic systolic CHF (EF improved to 45-50%), COPD, T2DM, HTN, HLD, GI bleed due to gastric and duodenal ulcers, PAD, osteomyelitis s/p right fifth amputation who presented to the ED for evaluation of dark black stools.  Patient with recent prolonged hospitalization 10/31/2022-11/17/2022.  Initially admitted with left foot diabetic ulcer with associated osteomyelitis of the left fifth toe.  Initially underwent I&D with resection of fifth metatarsal head 6/11.  Cultures grew MRSA.  Vascular surgery were consulted and patient underwent left posterior tibial artery and tibioperoneal trunk angioplasty on 11/03/2022.  He was started on aspirin and Plavix.  Hospitalization complicated by UGI bleed.  Underwent EGD 6/16 by Dr. Elnoria Howard which showed nonbleeding gastric and duodenal ulcers.  He required transfusion 3 units PRBCs during admission.  Patient required repeat I&D and washout of left foot 6/19.  Repeat MRI showed residual osteomyelitis and patient returned to the OR 6/26 for fifth ray amputation. Patient was seen by ID who recommended 6-week antibiotic course with Augmentin and doxycycline.  Patient states that since he was discharged from the hospital he has been seeing dark black-colored stool.  He has been getting lightheaded when walking but has not passed out or fallen.  He reports feeling short of breath when he walks long distances.  He is currently ambulating with the use of a rolling walker.  He has not had any abdominal pain, nausea, vomiting, hematemesis.  He denies chest pain, fevers, chills.  He states that he is still taking aspirin and  Plavix.  He says he is not taking any NSAIDs although diclofenac 75 mg BID is listed on his home meds.  He reports smoking about 4-5 cigarettes a day.  ED Course  Labs/Imaging on admission: I have personally reviewed following labs and imaging studies.  Initial vitals showed BP 130/75, pulse 128, RR 22, temp 97.5 F, SpO2 98% on room air.  Labs showed WBC 6.9, hemoglobin 9.5, platelets 414,000, sodium 140, potassium 4.6, bicarb 19, BUN 33, creatinine 1.24, serum glucose 150.  Repeat hemoglobin was 7.8.  Patient was given 1 L LR. The hospitalist service was consulted to admit for further evaluation and management.  Review of Systems: All systems reviewed and are negative except as documented in history of present illness above.   Past Medical History:  Diagnosis Date   COPD (chronic obstructive pulmonary disease) (HCC)    Diabetes mellitus, type 2 (HCC)    HLD (hyperlipidemia)    Hypertension    Tobacco abuse     Past Surgical History:  Procedure Laterality Date   ABDOMINAL AORTOGRAM W/LOWER EXTREMITY N/A 11/03/2022   Procedure: ABDOMINAL AORTOGRAM W/LOWER EXTREMITY;  Surgeon: Cephus Shelling, MD;  Location: [facility] INVASIVE CV LAB;  Service: Cardiovascular;  Laterality: N/A;   AMPUTATION Left 10/31/2022   Procedure: AMPUTATION LEFT FIFTH TOE;  Surgeon: Louann Sjogren, DPM;  Location: [facility] OR;  Service: Podiatry;  Laterality: Left;   AMPUTATION Left 11/15/2022   Procedure: AMPUTATION FITH RAY;  Surgeon: Felecia Shelling, DPM;  Location: [facility] OR;  Service: Podiatry;  Laterality: Left;   AMPUTATION TOE Left 10/31/2022   Procedure: IRRIGATION AND DEBRIDEMENTOF LEFT FOOT;  Surgeon: Louann Sjogren, DPM;  Location: [facility] OR;  Service: Podiatry;  Laterality: Left;   BIOPSY  11/05/2022   Procedure: BIOPSY;  Surgeon: Jeani Hawking, MD;  Location: [facility] ENDOSCOPY;  Service: Gastroenterology;;   CORONARY ARTERY BYPASS GRAFT N/A 06/10/2021   Procedure: CORONARY ARTERY BYPASS GRAFTING (CABG) TIMES FOUR,  USING LEFT INTERNAL MAMMARY ARTERY AND RIGHT GREATER SAPHENOUS VEIN HARVESTED ENDOSCOPICALLY;  Surgeon: Lovett Sox, MD;  Location: [facility] OR;  Service: Open Heart Surgery;  Laterality: N/A;   ENDOVEIN HARVEST OF GREATER SAPHENOUS VEIN Right 06/10/2021   Procedure: ENDOVEIN HARVEST OF GREATER SAPHENOUS VEIN;  Surgeon: Lovett Sox, MD;  Location: [facility] OR;  Service: Open Heart Surgery;  Laterality: Right;   ESOPHAGOGASTRODUODENOSCOPY (EGD) WITH PROPOFOL N/A 11/05/2022   Procedure: ESOPHAGOGASTRODUODENOSCOPY (EGD) WITH PROPOFOL;  Surgeon: Jeani Hawking, MD;  Location: [facility] ENDOSCOPY;  Service: Gastroenterology;  Laterality: N/A;   I & D EXTREMITY Left 11/08/2022   Procedure: IRRIGATION AND DEBRIDEMENT OF FOOT AND WASHOUT;  Surgeon: Vivi Barrack, DPM;  Location: [facility] OR;  Service: Podiatry;  Laterality: Left;   PERIPHERAL VASCULAR BALLOON ANGIOPLASTY  11/03/2022   Procedure: PERIPHERAL VASCULAR BALLOON ANGIOPLASTY;  Surgeon: Cephus Shelling, MD;  Location: [facility];  Service: Cardiovascular;;   RIGHT/LEFT HEART CATH AND CORONARY ANGIOGRAPHY N/A 06/06/2021   Procedure: RIGHT/LEFT HEART CATH AND CORONARY ANGIOGRAPHY;  Surgeon: Dolores Patty, MD;  Location: [facility];  Service: Cardiovascular;  Laterality: N/A;   TEE WITHOUT CARDIOVERSION N/A 06/10/2021   Procedure: TRANSESOPHAGEAL ECHOCARDIOGRAM (TEE);  Surgeon: Lovett Sox, MD;  Location: [facility] OR;  Service: Open Heart Surgery;  Laterality: N/A;    Social History:  reports that he has been smoking cigarettes. He has been smoking an average of .22 packs per day. He uses smokeless tobacco. He reports current alcohol use. He reports that he does not use drugs.  Allergies  Allergen Reactions   Lisinopril     Leg swelling   Statins     Leg swelling    Family History  Problem Relation Age of Onset   Hypertension Mother    Cancer Mother    Heart disease Father      Prior to Admission medications   Medication Sig  Start Date End Date Taking? Authorizing Provider  acetaminophen (TYLENOL) 325 MG tablet Take 2 tablets (650 mg total) by mouth every 6 (six) hours as needed for mild pain, fever or headache. 11/07/22   Marguerita Merles Latif, DO  amoxicillin-clavulanate (AUGMENTIN) 875-125 MG tablet Take 1 tablet by mouth 2 (two) times daily. 11/09/22 12/21/22  Danelle Earthly, MD  aspirin EC 81 MG tablet Take 1 tablet (81 mg total) by mouth daily. Swallow whole. 02/27/22   Andrey Farmer, PA-C  clopidogrel (PLAVIX) 75 MG tablet Take 1 tablet (75 mg total) by mouth daily. 11/07/22   Marguerita Merles Latif, DO  dapagliflozin propanediol (FARXIGA) 10 MG TABS tablet Take 1 tablet (10 mg total) by mouth daily before breakfast. 02/27/22   Andrey Farmer, PA-C  doxycycline (VIBRAMYCIN) 100 MG capsule Take 1 capsule (100 mg total) by mouth 2 (two) times daily. 11/09/22 12/21/22  Danelle Earthly, MD  ezetimibe (ZETIA) 10 MG tablet Take 1 tablet (10 mg total) by mouth daily. 11/08/22   Marguerita Merles Latif, DO  gemfibrozil (LOPID) 600 MG tablet Take 600 mg by mouth 2 (two) times daily before a meal.    [provider]  insulin aspart (NOVOLOG) 100 UNIT/ML injection Inject 0-9 Units into the skin  3 (three) times daily with meals. CBG < 70: Implement Hypoglycemia Standing Orders CBG 70 - 120: 0 units CBG 121 - 150: 1 unit CBG 151 - 200: 2 units CBG 201 - 250: 3 units CBG 251 - 300: 5 units CBG 301 - 350: 7 units CBG 351 - 400: 9 units CBG > 400: call MD and obtain STAT lab verification 11/17/22   Marguerita Merles Latif, DO  insulin glargine-yfgn (SEMGLEE, YFGN,) 100 UNIT/ML injection Inject 0.2 mLs (20 Units total) into the skin 2 (two) times daily. Sliding 11/17/22   Sheikh, Omair Latif, DO  Insulin Syringe-Needle U-100 30G X 1/2" 0.3 ML MISC Use with Novolog 11/17/22   Marguerita Merles Latif, DO  Melatonin 10 MG TABS Take 10 mg by mouth at bedtime.    [provider]  metFORMIN (GLUCOPHAGE) 1000 MG tablet Take 1,000 mg by  mouth 2 (two) times daily. 05/26/21   [provider]  nicotine (NICODERM CQ - DOSED IN MG/24 HOURS) 14 mg/24hr patch Place 1 patch (14 mg total) onto the skin daily. 11/08/22   Marguerita Merles Latif, DO  nitroGLYCERIN (NITROSTAT) 0.4 MG SL tablet Place 1 tablet (0.4 mg total) under the tongue every 5 (five) minutes as needed for chest pain. 09/04/22 12/03/22  Bensimhon, Bevelyn Buckles, MD  pantoprazole (PROTONIX) 40 MG tablet Take 1 tablet (40 mg total) by mouth 2 (two) times daily. 11/07/22 01/06/23  Marguerita Merles Latif, DO  polyethylene glycol (MIRALAX / GLYCOLAX) 17 g packet Take 17 g by mouth daily as needed for mild constipation. 11/07/22   Merlene Laughter, DO    Physical Exam: Vitals:   11/23/22 1634 11/23/22 1900 11/23/22 2000 11/23/22 2102  BP: (!) 142/81 124/72 132/79 (!) 157/89  Pulse: (!) 107 87 (!) 115 92  Resp: 17 (!) 23 (!) 21 20  Temp: 98.6 F (37 C)   98.7 F (37.1 C)  TempSrc: Oral   Oral  SpO2: 100% 100% 100% 100%  Weight:      Height:       Constitutional: Resting in bed, NAD, calm, comfortable Eyes: EOMI, lids and conjunctivae normal ENMT: Mucous membranes are moist. Posterior pharynx clear of any exudate or lesions.Normal dentition.  Neck: normal, supple, no masses. Respiratory: clear to auscultation bilaterally, no wheezing, no crackles. Normal respiratory effort. No accessory muscle use.  Cardiovascular: Regular rate and rhythm, no murmurs / rubs / gallops. No extremity edema. Abdomen: no tenderness, no masses palpated. No hepatosplenomegaly. Bowel sounds positive.  Musculoskeletal: S/p left fifth ray amputation, wound dressing in place Skin: no rashes, lesions, ulcers. No induration Neurologic:  Sensation intact. Strength equal bilaterally. Psychiatric: Alert and oriented x 3. Normal mood.   EKG: Personally reviewed. Sinus rhythm, rate 91, no acute ischemic changes.  Rate is slower and ST changes no longer present when compared to  prior.  Assessment/Plan Principal Problem:   Acute upper GI bleeding Active Problems:   Hx of CABG   Atherosclerotic heart disease   Tobacco use   Hypertension associated with diabetes (HCC)   Hyperlipidemia associated with type 2 diabetes mellitus (HCC)   Diabetes mellitus, type 2 (HCC)   COPD (chronic obstructive pulmonary disease) (HCC)   Chronic systolic CHF (congestive heart failure) (HCC)   Kenneth Bailey is a 57 y.o. male with medical history significant for CAD s/p CABG, chronic systolic CHF (EF improved to 45-50%), COPD, T2DM, HTN, HLD, GI bleed due to gastric and duodenal ulcers, PAD, osteomyelitis s/p right fifth amputation who is  admitted with symptomatic anemia due to upper GI bleed.  Assessment and Plan: Symptomatic anemia due to upper GI bleed: On DAPT after recent LLE angioplasty.  Recent GI bleed s/p EGD 6/16 by Dr. Elnoria Howard which showed nonbleeding gastric and duodenal ulcers.  Presenting with persistent melena.  Hemoglobin 9.5 > 7.8 on repeat in the ED. he denies NSAID use.  Review of home meds showed that he has been taking oral diclofenac regularly. -Hold aspirin and Plavix -Start IV Protonix 40 mg BID -Transfuse 1 unit PRBC -Avoid NSAIDs -Secure message sent to GI Dr. Loreta Ave for routine a.m. consult  PAD s/p left posterior tibial artery and tibioperoneal trunk angioplasty 11/03/2022: Aspirin and Plavix held as above.  Not on statin due to intolerance.  Continue Zetia.  Osteomyelitis of left foot s/p fifth ray amputation 11/15/2022: Recent prolonged hospitalization with initial I&D 6/11 followed by repeat I&D and washout 6/19 then completed fifth ray amputation 6/26.  Wound cultures grew MRSA and Enterococcus faecalis.  On 6-week total antibiotics with Augmentin and doxycycline per ID recommendation.  Seen by podiatry in clinic 7/2, progressing as expected postoperatively per their assessment. -Continue Augmentin and doxycycline through 7/30 -Follow up with podiatry  in clinic 7/15 -Follow-up with ID in clinic 7/23  Chronic systolic CHF/ICM: Euvolemic on admission.  EF improved to 45-50% by TTE 08/2022.  Not on diuretic as an outpatient. -Hold Marcelline Deist given recent lower limb amputation -Not on beta-blocker due to dizziness and hypotension -Not on spironolactone due to hyperkalemia  CAD s/p CABG 2023: Stable, denies chest pain.  Aspirin and Plavix on hold as above.  Reported intolerance to statin.  Continue Zetia.  Type 2 diabetes: Holding [place] and metformin.  Placed on Semglee 10 units nightly and SSI q4h while NPO.  Hypertension: Off antihypertensives now.  COPD: Stable.  Continue albuterol as needed.  Hyperlipidemia: Continue Zetia.  Tobacco use: Reports smoking 4-5 cigarettes daily.  Smoking cessation advised.  Nicotine patch provided.   DVT prophylaxis: SCDs Start: 11/23/22 1954 Code Status: Full code, confirmed with patient on admission Family Communication: Discussed with patient, he has discussed with family Disposition Plan: From home, dispo pending clinical progress Consults called: Secure message sent to GI Dr. Loreta Ave for routine a.m. consult Severity of Illness: The appropriate patient status for this patient is INPATIENT. Inpatient status is judged to be reasonable and necessary in order to provide the required intensity of service to ensure the patient's safety. The patient's presenting symptoms, physical exam findings, and initial radiographic and laboratory data in the context of their chronic comorbidities is felt to place them at high risk for further clinical deterioration. Furthermore, it is not anticipated that the patient will be medically stable for discharge from the hospital within 2 midnights of admission.   * I certify that at the point of admission it is my clinical judgment that the patient will require inpatient hospital care spanning beyond 2 midnights from the point of admission due to high intensity of service,  high risk for further deterioration and high frequency of surveillance required.Darreld Mclean MD [facility]  If 7PM-7AM, please contact night-coverage [email or web address]  11/23/2022, 9:09 PM

## 2022-11-23 NOTE — ED Notes (Signed)
ED TO INPATIENT HANDOFF REPORT  ED Nurse Name and Phone #: Rodney Booze [phone]  S Name/Age/Gender Kenneth Bailey 57 y.o. male Room/Bed: 022C/022C  Code Status   Code Status: Full Code  Home/SNF/Other Home Patient oriented to: self, place, time, and situation Is this baseline? Yes   Triage Complete: Triage complete  Chief Complaint Acute upper GI bleeding [K92.2]  Triage Note Pt c/o dark red blood in stool since he got out of hospital last week. Pt c/o dizziness and weakness. Pt denies SOB.   Allergies Allergies  Allergen Reactions   Lisinopril     Leg swelling   Statins     Leg swelling    Level of Care/Admitting Diagnosis ED Disposition     ED Disposition  Admit   Condition  --   Comment  Hospital Area: [facility] [[id number]]  Level of Care: Progressive [102]  Admit to Progressive based on following criteria: GI, ENDOCRINE disease patients with GI bleeding, acute liver failure or pancreatitis, stable with diabetic ketoacidosis or thyrotoxicosis (hypothyroid) state.  May admit patient to [name] or [facility] if equivalent level of care is available:: No  Covid Evaluation: Asymptomatic - no recent exposure (last 10 days) testing not required  Diagnosis: Acute upper GI bleeding [098119]  Admitting Physician: Charlsie Quest [[id number]]  Attending Physician: Charlsie Quest [[id number]]  Certification:: I certify this patient will need inpatient services for at least 2 midnights  Estimated Length of Stay: 2          B Medical/Surgery History Past Medical History:  Diagnosis Date   COPD (chronic obstructive pulmonary disease) (HCC)    Diabetes mellitus, type 2 (HCC)    HLD (hyperlipidemia)    Hypertension    Tobacco abuse    Past Surgical History:  Procedure Laterality Date   ABDOMINAL AORTOGRAM W/LOWER EXTREMITY N/A 11/03/2022   Procedure: ABDOMINAL AORTOGRAM W/LOWER EXTREMITY;  Surgeon: Cephus Shelling, MD;  [facility]: [facility]  INVASIVE CV LAB;  Service: Cardiovascular;  Laterality: N/A;   AMPUTATION Left 10/31/2022   Procedure: AMPUTATION LEFT FIFTH TOE;  Surgeon: Louann Sjogren, DPM;  Location: [facility] OR;  Service: Podiatry;  Laterality: Left;   AMPUTATION Left 11/15/2022   Procedure: AMPUTATION FITH RAY;  Surgeon: Felecia Shelling, DPM;  Location: [facility] OR;  Service: Podiatry;  Laterality: Left;   AMPUTATION TOE Left 10/31/2022   Procedure: IRRIGATION AND DEBRIDEMENTOF LEFT FOOT;  Surgeon: Louann Sjogren, DPM;  Location: [facility] OR;  Service: Podiatry;  Laterality: Left;   BIOPSY  11/05/2022   Procedure: BIOPSY;  Surgeon: Jeani Hawking, MD;  Location: [facility] ENDOSCOPY;  Service: Gastroenterology;;   CORONARY ARTERY BYPASS GRAFT N/A 06/10/2021   Procedure: CORONARY ARTERY BYPASS GRAFTING (CABG) TIMES FOUR, USING LEFT INTERNAL MAMMARY ARTERY AND RIGHT GREATER SAPHENOUS VEIN HARVESTED ENDOSCOPICALLY;  Surgeon: Lovett Sox, MD;  Location: [facility] OR;  Service: Open Heart Surgery;  Laterality: N/A;   ENDOVEIN HARVEST OF GREATER SAPHENOUS VEIN Right 06/10/2021   Procedure: ENDOVEIN HARVEST OF GREATER SAPHENOUS VEIN;  Surgeon: Lovett Sox, MD;  Location: [facility] OR;  Service: Open Heart Surgery;  Laterality: Right;   ESOPHAGOGASTRODUODENOSCOPY (EGD) WITH PROPOFOL N/A 11/05/2022   Procedure: ESOPHAGOGASTRODUODENOSCOPY (EGD) WITH PROPOFOL;  Surgeon: Jeani Hawking, MD;  Location: [facility] ENDOSCOPY;  Service: Gastroenterology;  Laterality: N/A;   I & D EXTREMITY Left 11/08/2022   Procedure: IRRIGATION AND DEBRIDEMENT OF FOOT AND WASHOUT;  Surgeon: Vivi Barrack, DPM;  Location: [facility] OR;  Service: Podiatry;  Laterality: Left;  PERIPHERAL VASCULAR BALLOON ANGIOPLASTY  11/03/2022   Procedure: PERIPHERAL VASCULAR BALLOON ANGIOPLASTY;  Surgeon: Cephus Shelling, MD;  [facility] INVASIVE CV LAB;  Service: Cardiovascular;;   RIGHT/LEFT HEART CATH AND CORONARY ANGIOGRAPHY N/A 06/06/2021   Procedure: RIGHT/LEFT HEART CATH AND CORONARY ANGIOGRAPHY;  Surgeon:  Dolores Patty, MD;  [facility]: [facility] INVASIVE CV LAB;  Service: Cardiovascular;  Laterality: N/A;   TEE WITHOUT CARDIOVERSION N/A 06/10/2021   Procedure: TRANSESOPHAGEAL ECHOCARDIOGRAM (TEE);  Surgeon: Lovett Sox, MD;  Location: [facility] OR;  Service: Open Heart Surgery;  Laterality: N/A;     A IV Location/Drains/Wounds Patient Lines/Drains/Airways Status     Active Line/Drains/Airways     Name Placement date Placement time Site Days   Peripheral IV 11/23/22 18 G Anterior;Distal;Left Forearm 11/23/22  1919  Forearm  less than 1   Wound / Incision (Open or Dehisced) 10/30/22 Diabetic ulcer Foot Left;Posterior 10/30/22  2345  Foot  24            Intake/Output Last 24 hours No intake or output data in the 24 hours ending 11/23/22 2004  Labs/Imaging Results for orders placed or performed during the hospital encounter of 11/23/22 (from the past 48 hour(s))  Type and screen [facility]     Status: None (Preliminary result)   Collection Time: 11/23/22  2:03 PM  Result Value Ref Range   ABO/RH(D) O POS    Antibody Screen NEG    Sample Expiration      11/26/2022,2359 Performed at [facility], [address] [place], [place] [address]    Unit Number [id number]    Blood Component Type RED CELLS,LR    Unit division 00    Status of Unit ALLOCATED    Transfusion Status OK TO TRANSFUSE    Crossmatch Result Compatible   Comprehensive metabolic panel     Status: Abnormal   Collection Time: 11/23/22  2:42 PM  Result Value Ref Range   Sodium 140 135 - 145 mmol/L   Potassium 4.6 3.5 - 5.1 mmol/L   Chloride 107 98 - 111 mmol/L   CO2 19 (L) 22 - 32 mmol/L   Glucose, Bld 150 (H) 70 - 99 mg/dL    Comment: Glucose reference range applies only to samples taken after fasting for at least 8 hours.   BUN 33 (H) 6 - 20 mg/dL   Creatinine, Ser 6.64 0.61 - 1.24 mg/dL   Calcium 9.6 8.9 - 40.3 mg/dL   Total Protein 7.4 6.5 - 8.1 g/dL    Comment:  POST-ULTRACENTRIFUGATION   Albumin 3.1 (L) 3.5 - 5.0 g/dL   AST 15 15 - 41 U/L   ALT 11 0 - 44 U/L   Alkaline Phosphatase 110 38 - 126 U/L   Total Bilirubin 0.4 0.3 - 1.2 mg/dL   GFR, Estimated >47 >42 mL/min    Comment: (NOTE) Calculated using the CKD-EPI Creatinine Equation (2021)    Anion gap 14 5 - 15    Comment: Performed at [facility], [address] [place], [place] [address]  CBC     Status: Abnormal   Collection Time: 11/23/22  2:42 PM  Result Value Ref Range   WBC 6.9 4.0 - 10.5 K/uL   RBC 3.27 (L) 4.22 - 5.81 MIL/uL   Hemoglobin 9.5 (L) 13.0 - 17.0 g/dL   HCT 87.5 (L) 64.3 - 32.9 %   MCV 90.8 80.0 - 100.0 fL   MCH 29.1 26.0 - 34.0 pg  MCHC 32.0 30.0 - 36.0 g/dL   RDW 16.1 09.6 - 04.5 %   Platelets 414 (H) 150 - 400 K/uL   nRBC 0.0 0.0 - 0.2 %    Comment: Performed at [facility], [address], [place], [place] [address]  CBC     Status: Abnormal   Collection Time: 11/23/22  5:30 PM  Result Value Ref Range   WBC 6.6 4.0 - 10.5 K/uL   RBC 2.75 (L) 4.22 - 5.81 MIL/uL   Hemoglobin 7.8 (L) 13.0 - 17.0 g/dL   HCT 19.1 (L) 47.8 - 29.5 %   MCV 92.0 80.0 - 100.0 fL   MCH 28.4 26.0 - 34.0 pg   MCHC 30.8 30.0 - 36.0 g/dL   RDW 62.1 30.8 - 65.7 %   Platelets 326 150 - 400 K/uL   nRBC 0.0 0.0 - 0.2 %    Comment: Performed at [facility], [address], [place], [place] [address]  Prepare RBC (crossmatch)     Status: None   Collection Time: 11/23/22  7:57 PM  Result Value Ref Range   Order Confirmation      ORDER PROCESSED BY BLOOD BANK Performed at [facility], [address] [place], [place] [address]    No results found.  Pending Labs Unresulted Labs (From admission, onward)     Start     Ordered   11/24/22 0500  CBC  Tomorrow morning,   R        11/23/22 1955   11/24/22 0500  Basic metabolic panel  Tomorrow morning,   R        11/23/22 1955            Vitals/Pain Today's Vitals   11/23/22 1402 11/23/22 1634  11/23/22 1900 11/23/22 2000  BP:  (!) 142/81 124/72 132/79  Pulse:  (!) 107 87 (!) 115  Resp:  17 (!) 23 (!) 21  Temp:  98.6 F (37 C)    TempSrc:  Oral    SpO2:  100% 100% 100%  Weight: 71.4 kg     Height: 5\' 8"  (1.727 m)     PainSc: 5        Isolation Precautions No active isolations  Medications Medications  sodium chloride flush (NS) 0.9 % injection 3 mL (has no administration in time range)  acetaminophen (TYLENOL) tablet 650 mg (has no administration in time range)    Or  acetaminophen (TYLENOL) suppository 650 mg (has no administration in time range)  ondansetron (ZOFRAN) tablet 4 mg (has no administration in time range)    Or  ondansetron (ZOFRAN) injection 4 mg (has no administration in time range)  nicotine (NICODERM CQ - dosed in mg/24 hours) patch 14 mg (has no administration in time range)  pantoprazole (PROTONIX) injection 40 mg (has no administration in time range)  0.9 %  sodium chloride infusion (Manually program via Guardrails IV Fluids) (has no administration in time range)  lactated ringers bolus 1,000 mL (1,000 mLs Intravenous New Bag/Given 11/23/22 1919)    Mobility walks with device     Focused Assessments Cardiac Assessment Handoff:  Cardiac Rhythm: Normal sinus rhythm No results found for: "CKTOTAL", "CKMB", "CKMBINDEX", "TROPONINI" No results found for: "DDIMER" Does the Patient currently have chest pain? No    R Recommendations: See Admitting Provider Note  Report given to:   Additional Notes:

## 2022-11-23 NOTE — Hospital Course (Signed)
Kenneth Bailey is a 57 y.o. male with medical history significant for CAD s/p CABG, chronic systolic CHF (EF improved to 45-50%), COPD, T2DM, HTN, HLD, GI bleed due to gastric and duodenal ulcers, PAD, osteomyelitis s/p right fifth amputation who is admitted with symptomatic anemia due to upper GI bleed.

## 2022-11-23 NOTE — ED Provider Notes (Signed)
Kenneth Bailey Provider Note   CSN: [id number] Arrival date & time: 11/23/22  1354     History  Chief Complaint  Patient presents with  . GI Bleeding    Kenneth Bailey is a 57 y.o. male.  57 year old male history of CAD status post CABG, PAD, DM, recent hospitalization for diabetic foot ulcer requiring surgery.  During hospitalization found to have nonbleeding ulcers on upper EGD.  Discharged home on 6/28.  Since then he reports has had 1-2 melanic stools per day.  Denies any nausea or vomiting.  Reports that they are dark and tarry, denies BRBPR.  Endorses weakness and palpitations but denies any other symptoms.  Currently only taking Plavix and aspirin, denies any DOACs.       Home Medications Prior to Admission medications   Medication Sig Start Date End Date Taking? Authorizing Provider  acetaminophen (TYLENOL) 325 MG tablet Take 2 tablets (650 mg total) by mouth every 6 (six) hours as needed for mild pain, fever or headache. 11/07/22  Yes Marland Mcalpine, Omair Latif, DO  aspirin EC 81 MG tablet Take 1 tablet (81 mg total) by mouth daily. Swallow whole. 02/27/22  Yes Andrey Farmer, PA-C  clopidogrel (PLAVIX) 75 MG tablet Take 1 tablet (75 mg total) by mouth daily. 11/07/22  Yes Sheikh, Omair Latif, DO  dapagliflozin propanediol (FARXIGA) 10 MG TABS tablet Take 1 tablet (10 mg total) by mouth daily before breakfast. 02/27/22  Yes Andrey Farmer, PA-C  diclofenac (VOLTAREN) 75 MG EC tablet Take 75 mg by mouth 2 (two) times daily.   Yes [provider]  doxycycline (VIBRAMYCIN) 100 MG capsule Take 1 capsule (100 mg total) by mouth 2 (two) times daily. 11/09/22 12/21/22 Yes Danelle Earthly, MD  ezetimibe (ZETIA) 10 MG tablet Take 1 tablet (10 mg total) by mouth daily. 11/08/22  Yes Sheikh, Omair Latif, DO  gabapentin (NEURONTIN) 600 MG tablet Take 600 mg by mouth 3 (three) times daily.   Yes [provider]  gemfibrozil  (LOPID) 600 MG tablet Take 600 mg by mouth 2 (two) times daily before a meal.   Yes [provider]  insulin aspart (NOVOLOG) 100 UNIT/ML injection Inject 0-9 Units into the skin 3 (three) times daily with meals. CBG < 70: Implement Hypoglycemia Standing Orders CBG 70 - 120: 0 units CBG 121 - 150: 1 unit CBG 151 - 200: 2 units CBG 201 - 250: 3 units CBG 251 - 300: 5 units CBG 301 - 350: 7 units CBG 351 - 400: 9 units CBG > 400: call MD and obtain STAT lab verification 11/17/22  Yes Sheikh, Omair Latif, DO  Insulin Glargine (BASAGLAR KWIKPEN [place]) Inject 2-50 Units into the skin 2 (two) times daily.   Yes [provider]  Melatonin 10 MG TABS Take 10 mg by mouth at bedtime.   Yes [provider]  metFORMIN (GLUCOPHAGE) 1000 MG tablet Take 1,000 mg by mouth 2 (two) times daily. 05/26/21  Yes [provider]  nicotine (NICODERM CQ - DOSED IN MG/24 HOURS) 14 mg/24hr patch Place 1 patch (14 mg total) onto the skin daily. 11/08/22  Yes Sheikh, Omair Latif, DO  nitroGLYCERIN (NITROSTAT) 0.4 MG SL tablet Place 1 tablet (0.4 mg total) under the tongue every 5 (five) minutes as needed for chest pain. 09/04/22 12/03/22 Yes Bensimhon, Bevelyn Buckles, MD  oxyCODONE (OXY IR/ROXICODONE) 5 MG immediate release tablet Take 5 mg by mouth every 6 (six) hours as needed  for breakthrough pain or moderate pain.   Yes [provider]  pantoprazole (PROTONIX) 40 MG tablet Take 1 tablet (40 mg total) by mouth 2 (two) times daily. 11/07/22 01/06/23 Yes Sheikh, Omair Latif, DO  polyethylene glycol (MIRALAX / GLYCOLAX) 17 g packet Take 17 g by mouth daily as needed for mild constipation. 11/07/22  Yes Sheikh, Omair Latif, DO  Insulin Syringe-Needle U-100 30G X 1/2" 0.3 ML MISC Use with Novolog 11/17/22   Kenneth Merles Latif, DO      Allergies    Lisinopril and Statins    Review of Systems   Review of Systems  Cardiovascular:  Positive for palpitations. Negative for chest pain.  Gastrointestinal:   Positive for blood in stool. Negative for abdominal pain, anal bleeding, nausea, rectal pain and vomiting.  Genitourinary:  Negative for dysuria.  Neurological:  Positive for weakness and light-headedness. Negative for syncope.    Physical Exam Updated Vital Signs BP (!) 157/89 (BP Location: Right Arm)   Pulse 92   Temp 98.7 F (37.1 C) (Oral)   Resp 20   Ht 5\' 8"  (1.727 m)   Wt 71.6 kg   SpO2 100%   BMI 24.00 kg/m  Physical Exam Vitals and nursing note reviewed.  Constitutional:      General: He is not in acute distress.    Appearance: He is not ill-appearing or toxic-appearing.  HENT:     Head: Normocephalic.     Mouth/Throat:     Mouth: Mucous membranes are moist.  Eyes:     Extraocular Movements: Extraocular movements intact.     Pupils: Pupils are equal, round, and reactive to light.     Comments: Pale conjunctiva  Cardiovascular:     Rate and Rhythm: Regular rhythm. Tachycardia present.     Pulses: Normal pulses.     Heart sounds: Normal heart sounds.  Pulmonary:     Effort: Pulmonary effort is normal. No respiratory distress.     Breath sounds: Normal breath sounds. No stridor. No wheezing or rales.  Abdominal:     General: There is no distension.     Palpations: Abdomen is soft.     Tenderness: There is no abdominal tenderness. There is no guarding or rebound.  Musculoskeletal:        General: Normal range of motion.  Skin:    General: Skin is warm.     Capillary Refill: Capillary refill takes less than 2 seconds.     Coloration: Skin is pale.  Neurological:     General: No focal deficit present.     Mental Status: He is alert and oriented to person, place, and time.    ED Results / Procedures / Treatments   Labs (all labs ordered are listed, but only abnormal results are displayed) Labs Reviewed  COMPREHENSIVE METABOLIC PANEL - Abnormal; Notable for the following components:      Result Value   CO2 19 (*)    Glucose, Bld 150 (*)    BUN 33 (*)     Albumin 3.1 (*)    All other components within normal limits  CBC - Abnormal; Notable for the following components:   RBC 3.27 (*)    Hemoglobin 9.5 (*)    HCT 29.7 (*)    Platelets 414 (*)    All other components within normal limits  CBC - Abnormal; Notable for the following components:   RBC 2.75 (*)    Hemoglobin 7.8 (*)    HCT 25.3 (*)  All other components within normal limits  GLUCOSE, CAPILLARY - Abnormal; Notable for the following components:   Glucose-Capillary 145 (*)    All other components within normal limits  CBC  BASIC METABOLIC PANEL  POC OCCULT BLOOD, ED  TYPE AND SCREEN  PREPARE RBC (CROSSMATCH)    EKG EKG Interpretation Date/Time:  Thursday November 23 2022 14:58:40 EDT Ventricular Rate:  91 PR Interval:  157 QRS Duration:  92 QT Interval:  344 QTC Calculation: 424 R Axis:   65  Text Interpretation: Sinus rhythm RSR' in V1 or V2, probably normal variant Nonspecific T abnormalities, lateral leads when compared to prior, similar appearance. no STEMI Confirmed by Theda Belfast ([id number]) on 11/23/2022 3:04:33 PM  Radiology No results found.  Procedures Procedures    Medications Ordered in ED Medications  sodium chloride flush (NS) 0.9 % injection 3 mL (3 mLs Intravenous Given 11/23/22 2128)  acetaminophen (TYLENOL) tablet 650 mg (has no administration in time range)    Or  acetaminophen (TYLENOL) suppository 650 mg (has no administration in time range)  ondansetron (ZOFRAN) tablet 4 mg (has no administration in time range)    Or  ondansetron (ZOFRAN) injection 4 mg (has no administration in time range)  nicotine (NICODERM CQ - dosed in mg/24 hours) patch 14 mg (14 mg Transdermal Patch Applied 11/23/22 2128)  pantoprazole (PROTONIX) injection 40 mg (40 mg Intravenous Given 11/23/22 2128)  doxycycline (VIBRA-TABS) tablet 100 mg (100 mg Oral Given 11/23/22 2127)  ezetimibe (ZETIA) tablet 10 mg (has no administration in time range)  gabapentin (NEURONTIN)  capsule 600 mg (600 mg Oral Given 11/23/22 2126)  gemfibrozil (LOPID) tablet 600 mg (has no administration in time range)  melatonin tablet 10 mg (10 mg Oral Given 11/23/22 2127)  oxyCODONE (Oxy IR/ROXICODONE) immediate release tablet 5 mg (5 mg Oral Given 11/23/22 2126)  insulin glargine-yfgn (SEMGLEE) injection 10 Units (has no administration in time range)  insulin aspart (novoLOG) injection 0-15 Units (2 Units Subcutaneous Given 11/23/22 2125)  albuterol (PROVENTIL) (2.5 MG/3ML) 0.083% nebulizer solution 2.5 mg (has no administration in time range)  morphine (PF) 2 MG/ML injection 2-4 mg (2 mg Intravenous Given 11/23/22 2232)  lactated ringers bolus 1,000 mL (1,000 mLs Intravenous New Bag/Given 11/23/22 1919)  0.9 %  sodium chloride infusion (Manually program via Guardrails IV Fluids) ( Intravenous New Bag/Given 11/23/22 2137)    ED Course/ Medical Decision Making/ A&P Clinical Course as of 11/23/22 2252  Thu Nov 23, 2022  1659 Hemoglobin(!): 9.5 [BB]  2036 Spoke with gastroenterology who is planning to evaluate the patient tomorrow, should he have an acute decompensation overnight requested to be repaged for more urgent evaluation. [BB]    Clinical Course User Index [BB] Fayrene Helper, MD                             Medical Decision Making 57 year old male on dual antiplatelet therapy here for melenic stool x 1 week.  Chart review shows that he did require 3 units of PRBCs for similar during hospitalization.  Upper scope did not show any evidence of active bleed during that hospitalization.  Tachycardic but otherwise well-appearing.  Will await labs, likely transfusion and admission pending CBC.  CBC returned with anemia. Will plan to recheck in 2 hours given ongoing melanic stool.   Second CBC with 1.7 point drop in Hgb over 2 hours. Hospitalist paged, who graciously agreed to admit the patient. Spoke briefly with GI  who stated they would plan to see the patient in the morning to eval for need  for any further procedure, requested to be repaged if the patient became hemodynamically unstable overnight.       Amount and/or Complexity of Data Reviewed External Data Reviewed: notes.    Details: Discharge summary from 6/28 Labs: ordered.  Risk Decision regarding hospitalization.           Final Clinical Impression(s) / ED Diagnoses Final diagnoses:  Melena  Symptomatic anemia    Rx / DC Orders ED Discharge Orders     None         Fayrene Helper, MD 11/23/22 2253    Tegeler, Canary Brim, MD 11/24/22 2328

## 2022-11-23 NOTE — ED Triage Notes (Signed)
Pt c/o dark red blood in stool since he got out of hospital last week. Pt c/o dizziness and weakness. Pt denies SOB.

## 2022-11-24 ENCOUNTER — Other Ambulatory Visit: Payer: Self-pay | Admitting: *Deleted

## 2022-11-24 ENCOUNTER — Encounter (HOSPITAL_COMMUNITY): Payer: Self-pay | Admitting: Internal Medicine

## 2022-11-24 ENCOUNTER — Inpatient Hospital Stay (HOSPITAL_COMMUNITY): Payer: BC Managed Care – PPO | Admitting: Anesthesiology

## 2022-11-24 ENCOUNTER — Encounter (HOSPITAL_COMMUNITY)
Admission: EM | Disposition: A | Discharge status: Home / Self Care | Payer: Self-pay | Source: Home / Self Care | Attending: Internal Medicine

## 2022-11-24 DIAGNOSIS — K449 Diaphragmatic hernia without obstruction or gangrene: Secondary | ICD-10-CM

## 2022-11-24 DIAGNOSIS — D62 Acute posthemorrhagic anemia: Secondary | ICD-10-CM

## 2022-11-24 DIAGNOSIS — E1159 Type 2 diabetes mellitus with other circulatory complications: Secondary | ICD-10-CM | POA: Diagnosis not present

## 2022-11-24 DIAGNOSIS — I739 Peripheral vascular disease, unspecified: Secondary | ICD-10-CM

## 2022-11-24 DIAGNOSIS — K2289 Other specified disease of esophagus: Secondary | ICD-10-CM | POA: Diagnosis not present

## 2022-11-24 DIAGNOSIS — K264 Chronic or unspecified duodenal ulcer with hemorrhage: Secondary | ICD-10-CM | POA: Diagnosis not present

## 2022-11-24 DIAGNOSIS — E785 Hyperlipidemia, unspecified: Secondary | ICD-10-CM

## 2022-11-24 DIAGNOSIS — I152 Hypertension secondary to endocrine disorders: Secondary | ICD-10-CM

## 2022-11-24 DIAGNOSIS — I5022 Chronic systolic (congestive) heart failure: Secondary | ICD-10-CM | POA: Diagnosis not present

## 2022-11-24 DIAGNOSIS — D649 Anemia, unspecified: Secondary | ICD-10-CM

## 2022-11-24 DIAGNOSIS — Z794 Long term (current) use of insulin: Secondary | ICD-10-CM

## 2022-11-24 DIAGNOSIS — E1169 Type 2 diabetes mellitus with other specified complication: Secondary | ICD-10-CM | POA: Diagnosis not present

## 2022-11-24 DIAGNOSIS — K921 Melena: Secondary | ICD-10-CM

## 2022-11-24 DIAGNOSIS — K3189 Other diseases of stomach and duodenum: Secondary | ICD-10-CM

## 2022-11-24 DIAGNOSIS — K2970 Gastritis, unspecified, without bleeding: Secondary | ICD-10-CM

## 2022-11-24 DIAGNOSIS — D509 Iron deficiency anemia, unspecified: Secondary | ICD-10-CM

## 2022-11-24 DIAGNOSIS — K922 Gastrointestinal hemorrhage, unspecified: Secondary | ICD-10-CM | POA: Diagnosis not present

## 2022-11-24 HISTORY — PX: HEMOSTASIS CONTROL: SHX6838

## 2022-11-24 HISTORY — PX: HOT HEMOSTASIS: SHX5433

## 2022-11-24 HISTORY — PX: ESOPHAGOGASTRODUODENOSCOPY: SHX5428

## 2022-11-24 HISTORY — PX: HEMOSTASIS CLIP PLACEMENT: SHX6857

## 2022-11-24 LAB — GLUCOSE, CAPILLARY
Glucose-Capillary: 121 mg/dL — ABNORMAL HIGH (ref 70–99)
Glucose-Capillary: 173 mg/dL — ABNORMAL HIGH (ref 70–99)
Glucose-Capillary: 323 mg/dL — ABNORMAL HIGH (ref 70–99)
Glucose-Capillary: 432 mg/dL — ABNORMAL HIGH (ref 70–99)
Glucose-Capillary: 56 mg/dL — ABNORMAL LOW (ref 70–99)
Glucose-Capillary: 74 mg/dL (ref 70–99)
Glucose-Capillary: 80 mg/dL (ref 70–99)
Glucose-Capillary: 84 mg/dL (ref 70–99)
Glucose-Capillary: 85 mg/dL (ref 70–99)
Glucose-Capillary: 95 mg/dL (ref 70–99)

## 2022-11-24 LAB — BASIC METABOLIC PANEL
Anion gap: 14 (ref 5–15)
BUN: 23 mg/dL — ABNORMAL HIGH (ref 6–20)
CO2: 25 mmol/L (ref 22–32)
Calcium: 9.8 mg/dL (ref 8.9–10.3)
Chloride: 99 mmol/L (ref 98–111)
Creatinine, Ser: 0.99 mg/dL (ref 0.61–1.24)
GFR, Estimated: >60 mL/min (ref >60)
Glucose, Bld: 74 mg/dL (ref 70–99)
Potassium: 4.7 mmol/L (ref 3.5–5.1)
Sodium: 138 mmol/L (ref 135–145)

## 2022-11-24 LAB — CBC
HCT: 27.7 % — ABNORMAL LOW (ref 39.0–52.0)
Hemoglobin: 8.8 g/dL — ABNORMAL LOW (ref 13.0–17.0)
MCH: 27.3 pg (ref 26.0–34.0)
MCHC: 31.8 g/dL (ref 30.0–36.0)
MCV: 86 fL (ref 80.0–100.0)
Platelets: 307 10*3/uL (ref 150–400)
RBC: 3.22 MIL/uL — ABNORMAL LOW (ref 4.22–5.81)
RDW: 14.6 % (ref 11.5–15.5)
WBC: 6.2 10*3/uL (ref 4.0–10.5)
nRBC: 0 % (ref 0.0–0.2)

## 2022-11-24 LAB — BPAM RBC
ISSUE DATE / TIME: 202407042304
Unit Type and Rh: 5100

## 2022-11-24 LAB — TYPE AND SCREEN
ABO/RH(D): O POS
Unit division: 0

## 2022-11-24 SURGERY — EGD (ESOPHAGOGASTRODUODENOSCOPY)
Anesthesia: Monitor Anesthesia Care

## 2022-11-24 MED ORDER — DEXAMETHASONE SODIUM PHOSPHATE 10 MG/ML IJ SOLN
INTRAMUSCULAR | Status: DC | PRN
  Administered 2022-11-24: 5 mg via INTRAVENOUS

## 2022-11-24 MED ORDER — LIDOCAINE 2% (20 MG/ML) 5 ML SYRINGE
INTRAMUSCULAR | Status: DC | PRN
  Administered 2022-11-24: 100 mg via INTRAVENOUS

## 2022-11-24 MED ORDER — DEXTROSE 50 % IV SOLN
INTRAVENOUS | Status: AC
  Administered 2022-11-24: 25 mL
  Filled 2022-11-24: qty 50

## 2022-11-24 MED ORDER — INSULIN ASPART 100 UNIT/ML IJ SOLN
0.0000 [IU] | Freq: Three times a day (TID) | INTRAMUSCULAR | Status: DC
  Administered 2022-11-25: 3 [IU] via SUBCUTANEOUS
  Administered 2022-11-25 (×2): 5 [IU] via SUBCUTANEOUS
  Administered 2022-11-26 (×2): 3 [IU] via SUBCUTANEOUS
  Administered 2022-11-26: 2 [IU] via SUBCUTANEOUS
  Administered 2022-11-27: 5 [IU] via SUBCUTANEOUS
  Administered 2022-11-27: 3 [IU] via SUBCUTANEOUS

## 2022-11-24 MED ORDER — PHENYLEPHRINE 80 MCG/ML (10ML) SYRINGE FOR IV PUSH (FOR BLOOD PRESSURE SUPPORT)
PREFILLED_SYRINGE | INTRAVENOUS | Status: DC | PRN
  Administered 2022-11-24: 160 ug via INTRAVENOUS

## 2022-11-24 MED ORDER — LACTATED RINGERS IV SOLN
INTRAVENOUS | Status: AC | PRN
  Administered 2022-11-24: 1000 mL via INTRAVENOUS

## 2022-11-24 MED ORDER — SODIUM CHLORIDE 0.9 % IV SOLN: INTRAVENOUS | Status: DC

## 2022-11-24 MED ORDER — INSULIN ASPART 100 UNIT/ML IJ SOLN
0.0000 [IU] | Freq: Every day | INTRAMUSCULAR | Status: DC
  Administered 2022-11-24: 4 [IU] via SUBCUTANEOUS

## 2022-11-24 MED ORDER — PROPOFOL 500 MG/50ML IV EMUL
INTRAVENOUS | Status: DC | PRN
  Administered 2022-11-24: 100 ug/kg/min via INTRAVENOUS

## 2022-11-24 MED ORDER — EPHEDRINE SULFATE-NACL 50-0.9 MG/10ML-% IV SOSY
PREFILLED_SYRINGE | INTRAVENOUS | Status: DC | PRN
  Administered 2022-11-24 (×2): 5 mg via INTRAVENOUS

## 2022-11-24 MED ORDER — PROPOFOL 10 MG/ML IV BOLUS
INTRAVENOUS | Status: DC | PRN
  Administered 2022-11-24: 40 mg via INTRAVENOUS

## 2022-11-24 MED ORDER — SODIUM CHLORIDE (PF) 0.9 % IJ SOLN
PREFILLED_SYRINGE | INTRAMUSCULAR | Status: DC | PRN
  Administered 2022-11-24: 2 mL

## 2022-11-24 MED ORDER — NICOTINE 21 MG/24HR TD PT24
21.0000 mg | MEDICATED_PATCH | Freq: Every day | TRANSDERMAL | Status: DC
  Administered 2022-11-24 – 2022-11-27 (×4): 21 mg via TRANSDERMAL
  Filled 2022-11-24 (×4): qty 1

## 2022-11-24 MED ORDER — EPINEPHRINE 1 MG/10ML IJ SOSY
PREFILLED_SYRINGE | INTRAMUSCULAR | Status: AC
  Filled 2022-11-24: qty 10

## 2022-11-24 MED ORDER — DEXTROSE 5 % IV SOLN: INTRAVENOUS | Status: DC

## 2022-11-24 NOTE — Consult Note (Signed)
Consultation Note   Referring Provider:  [facility] PCP: [name], MD Primary Gastroenterologist: In [facility]        Reason for consultation: GI bleed  DOA: 11/23/2022         Hospital Day: 2   Assessment and Plan   Patient is a 57 y.o. year old male with a past medical history of DM, hypertension, CAD status post CABG , combined systolic and diastolic heart failure HLD, COPD, osteomyelitis s/p R 5th toe amputation.   Recurrent upper GI bleed on plavix + Asa. Non-bleeding gastric and duodenal ulcers on EGD in June which may be source of bleed now. Also consider AVMs. Right colon bleed seems unlikely. Of note, BUN is chronically elevated.  -Schedule for EGD. Will try to do it today.  The risks and benefits of EGD with possible biopsies were discussed with the patient who agrees to proceed.  -Continue BID IV PPI -Monitor hgb.   See PMH for additional medical problems.    History of Present Illness Mr. Redstone had a prolonged hospitalization in June for diabetic foot ulcer and  osteomyelitis of one of his right toes.  He was seen by vascular surgery and ultimately underwent a left posterior tibial artery and tibioperoneal trunk angioplasty and was placed on aspirin and Plavix.he subsequently developed an upper GI bleed  and was seen by GI.  Inpatient EGD remarkable for gastric and duodenal ulcers.  Biopsies negative for H.pylori. He was discharged on 6/28 on BID Pantoprazole.  He was advised to follow-up with his gastroenterologist in [place]  Interval History :   Patient presented to the ED yesterday with dizziness and dark, tarry stool on plavix and asa. He thinks he is taking Pantoprazole but cannot be sure: He describes black stools for one week. No abdominal pain. He had not had any nausea until just this am. Other than daily asa he doesn't take NSAIDs.  In the ED he was tachycardic (now resolved), otherwise  hemodynamically stable. Upon recent hospital discharge his hemoglobin was 8.8.  In the ED it was 9.5 but upon recheck a few hours later it was down to 7.8.  He was transfused a unit of blood and hemoglobin now 8.8. No active bleeding this am.    Labs and Imaging: Recent Labs    11/23/22 1442 11/23/22 1730 11/24/22 0632  WBC 6.9 6.6 6.2  HGB 9.5* 7.8* 8.8*  HCT 29.7* 25.3* 27.7*  PLT 414* 326 307   Recent Labs    11/23/22 1442 11/24/22 0632  NA 140 138  K 4.6 4.7  CL 107 99  CO2 19* 25  GLUCOSE 150* 74  BUN 33* 23*  CREATININE 1.24 0.99  CALCIUM 9.6 9.8   Recent Labs    11/23/22 1442  PROT 7.4  ALBUMIN 3.1*  AST 15  ALT 11  ALKPHOS 110  BILITOT 0.4   No results for input(s): "HEPBSAG", "HCVAB", "HEPAIGM", "HEPBIGM" in the last 72 hours. No results for input(s): "LABPROT", "INR" in the last 72 hours.  Previous GI Evaluation:   He had a colonoscopy " a few months ago" but cannot remember the results.   11/05/2022 EGD for upper GI bleed -Non-bleeding gastric ulcers with a clean  ulcer base (Forrest Class III). Biopsied.  Non-bleeding duodenal ulcer with a flat pigmented spot (Forrest Class IIc).  A. STOMACH, BIOPSY:  Reactive gastropathy  Negative for H. pylori, intestinal metaplasia, dysplasia and carcinoma   Principal Problem:   Acute upper GI bleeding Active Problems:   Hx of CABG   Atherosclerotic heart disease   Tobacco use   Hypertension associated with diabetes (HCC)   Hyperlipidemia associated with type 2 diabetes mellitus (HCC)   Diabetes mellitus, type 2 (HCC)   COPD (chronic obstructive pulmonary disease) (HCC)   Chronic systolic CHF (congestive heart failure) (HCC)     Past Medical History:  Diagnosis Date   COPD (chronic obstructive pulmonary disease) (HCC)    Diabetes mellitus, type 2 (HCC)    HLD (hyperlipidemia)    Hypertension    Tobacco abuse     Past Surgical History:  Procedure Laterality Date   ABDOMINAL AORTOGRAM W/LOWER  EXTREMITY N/A 11/03/2022   Procedure: ABDOMINAL AORTOGRAM W/LOWER EXTREMITY;  Surgeon: Cephus Shelling, MD;  Location: [facility];  Service: Cardiovascular;  Laterality: N/A;   AMPUTATION Left 10/31/2022   Procedure: AMPUTATION LEFT FIFTH TOE;  Surgeon: Louann Sjogren, DPM;  Location: [facility] OR;  Service: Podiatry;  Laterality: Left;   AMPUTATION Left 11/15/2022   Procedure: AMPUTATION FITH RAY;  Surgeon: Felecia Shelling, DPM;  Location: [facility] OR;  Service: Podiatry;  Laterality: Left;   AMPUTATION TOE Left 10/31/2022   Procedure: IRRIGATION AND DEBRIDEMENTOF LEFT FOOT;  Surgeon: Louann Sjogren, DPM;  Location: [facility] OR;  Service: Podiatry;  Laterality: Left;   BIOPSY  11/05/2022   Procedure: BIOPSY;  Surgeon: Jeani Hawking, MD;  Location: [facility] ENDOSCOPY;  Service: Gastroenterology;;   CORONARY ARTERY BYPASS GRAFT N/A 06/10/2021   Procedure: CORONARY ARTERY BYPASS GRAFTING (CABG) TIMES FOUR, USING LEFT INTERNAL MAMMARY ARTERY AND RIGHT GREATER SAPHENOUS VEIN HARVESTED ENDOSCOPICALLY;  Surgeon: Lovett Sox, MD;  Location[facility] OR;  Service: Open Heart Surgery;  Laterality: N/A;   ENDOVEIN HARVEST OF GREATER SAPHENOUS VEIN Right 06/10/2021   Procedure: ENDOVEIN HARVEST OF GREATER SAPHENOUS VEIN;  Surgeon: Lovett Sox, MD;  Location: [facility] OR;  Service: Open Heart Surgery;  Laterality: Right;   ESOPHAGOGASTRODUODENOSCOPY (EGD) WITH PROPOFOL N/A 11/05/2022   Procedure: ESOPHAGOGASTRODUODENOSCOPY (EGD) WITH PROPOFOL;  Surgeon: Jeani Hawking, MD;  Location: [facility] ENDOSCOPY;  Service: Gastroenterology;  Laterality: N/A;   I & D EXTREMITY Left 11/08/2022   Procedure: IRRIGATION AND DEBRIDEMENT OF FOOT AND WASHOUT;  Surgeon: Vivi Barrack, DPM;  Location: [facility] OR;  Service: Podiatry;  Laterality: Left;   PERIPHERAL VASCULAR BALLOON ANGIOPLASTY  11/03/2022   Procedure: PERIPHERAL VASCULAR BALLOON ANGIOPLASTY;  Surgeon: Cephus Shelling, MD;  Location: [facility];  Service: Cardiovascular;;    RIGHT/LEFT HEART CATH AND CORONARY ANGIOGRAPHY N/A 06/06/2021   Procedure: RIGHT/LEFT HEART CATH AND CORONARY ANGIOGRAPHY;  Surgeon: Dolores Patty, MD;  Location: [facility];  Service: Cardiovascular;  Laterality: N/A;   TEE WITHOUT CARDIOVERSION N/A 06/10/2021   Procedure: TRANSESOPHAGEAL ECHOCARDIOGRAM (TEE);  Surgeon: Lovett Sox, MD;  Location[facility] OR;  Service: Open Heart Surgery;  Laterality: N/A;    Family History  Problem Relation Age of Onset   Hypertension Mother    Cancer Mother    Heart disease Father     Prior to Admission medications   Medication Sig Start Date End Date Taking? Authorizing Provider  acetaminophen (TYLENOL) 325 MG tablet Take 2 tablets (650 mg total) by mouth every 6 (six) hours as  needed for mild pain, fever or headache. 11/07/22  Yes Sheikh, Omair Latif, DO  aspirin EC 81 MG tablet Take 1 tablet (81 mg total) by mouth daily. Swallow whole. 02/27/22  Yes Andrey Farmer, PA-C  clopidogrel (PLAVIX) 75 MG tablet Take 1 tablet (75 mg total) by mouth daily. 11/07/22  Yes Sheikh, Omair Latif, DO  dapagliflozin propanediol (FARXIGA) 10 MG TABS tablet Take 1 tablet (10 mg total) by mouth daily before breakfast. 02/27/22  Yes Andrey Farmer, PA-C  diclofenac (VOLTAREN) 75 MG EC tablet Take 75 mg by mouth 2 (two) times daily.   Yes [provider]  doxycycline (VIBRAMYCIN) 100 MG capsule Take 1 capsule (100 mg total) by mouth 2 (two) times daily. 11/09/22 12/21/22 Yes Danelle Earthly, MD  ezetimibe (ZETIA) 10 MG tablet Take 1 tablet (10 mg total) by mouth daily. 11/08/22  Yes Sheikh, Omair Latif, DO  gabapentin (NEURONTIN) 600 MG tablet Take 600 mg by mouth 3 (three) times daily.   Yes [provider]  gemfibrozil (LOPID) 600 MG tablet Take 600 mg by mouth 2 (two) times daily before a meal.   Yes [provider]  insulin aspart (NOVOLOG) 100 UNIT/ML injection Inject 0-9 Units into the skin 3 (three) times daily with meals.  CBG < 70: Implement Hypoglycemia Standing Orders CBG 70 - 120: 0 units CBG 121 - 150: 1 unit CBG 151 - 200: 2 units CBG 201 - 250: 3 units CBG 251 - 300: 5 units CBG 301 - 350: 7 units CBG 351 - 400: 9 units CBG > 400: call MD and obtain STAT lab verification 11/17/22  Yes Sheikh, Omair Latif, DO  Insulin Glargine (BASAGLAR KWIKPEN [place]) Inject 2-50 Units into the skin 2 (two) times daily.   Yes [provider]  Melatonin 10 MG TABS Take 10 mg by mouth at bedtime.   Yes [provider]  metFORMIN (GLUCOPHAGE) 1000 MG tablet Take 1,000 mg by mouth 2 (two) times daily. 05/26/21  Yes [provider]  nicotine (NICODERM CQ - DOSED IN MG/24 HOURS) 14 mg/24hr patch Place 1 patch (14 mg total) onto the skin daily. 11/08/22  Yes Sheikh, Omair Latif, DO  nitroGLYCERIN (NITROSTAT) 0.4 MG SL tablet Place 1 tablet (0.4 mg total) under the tongue every 5 (five) minutes as needed for chest pain. 09/04/22 12/03/22 Yes Bensimhon, Bevelyn Buckles, MD  oxyCODONE (OXY IR/ROXICODONE) 5 MG immediate release tablet Take 5 mg by mouth every 6 (six) hours as needed for breakthrough pain or moderate pain.   Yes [provider]  pantoprazole (PROTONIX) 40 MG tablet Take 1 tablet (40 mg total) by mouth 2 (two) times daily. 11/07/22 01/06/23 Yes Sheikh, Omair Latif, DO  polyethylene glycol (MIRALAX / GLYCOLAX) 17 g packet Take 17 g by mouth daily as needed for mild constipation. 11/07/22  Yes Sheikh, Omair Latif, DO  Insulin Syringe-Needle U-100 30G X 1/2" 0.3 ML MISC Use with Novolog 11/17/22   Merlene Laughter, DO    Current Facility-Administered Medications  Medication Dose Route Frequency Provider Last Rate Last Admin   acetaminophen (TYLENOL) tablet 650 mg  650 mg Oral Q6H PRN Charlsie Quest, MD       Or   acetaminophen (TYLENOL) suppository 650 mg  650 mg Rectal Q6H PRN Darreld Mclean R, MD       albuterol (PROVENTIL) (2.5 MG/3ML) 0.083% nebulizer solution 2.5 mg  2.5 mg Nebulization Q6H PRN Charlsie Quest, MD  dextrose 5 % solution   Intravenous Continuous Kathlen Mody, MD 75 mL/hr at 11/24/22 0824 New Bag at 11/24/22 0824   doxycycline (VIBRA-TABS) tablet 100 mg  100 mg Oral BID Charlsie Quest, MD   100 mg at 11/23/22 2127   ezetimibe (ZETIA) tablet 10 mg  10 mg Oral Daily Charlsie Quest, MD       gabapentin (NEURONTIN) capsule 600 mg  600 mg Oral TID Darreld Mclean R, MD   600 mg at 11/23/22 2126   gemfibrozil (LOPID) tablet 600 mg  600 mg Oral BID AC Patel, Vishal R, MD       insulin aspart (novoLOG) injection 0-15 Units  0-15 Units Subcutaneous Q4H Charlsie Quest, MD   2 Units at 11/23/22 2125   insulin glargine-yfgn (SEMGLEE) injection 10 Units  10 Units Subcutaneous QHS Darreld Mclean R, MD       melatonin tablet 10 mg  10 mg Oral QHS Darreld Mclean R, MD   10 mg at 11/23/22 2127   morphine (PF) 2 MG/ML injection 2-4 mg  2-4 mg Intravenous Q4H PRN Hillary Bow, DO   4 mg at 11/24/22 1610   nicotine (NICODERM CQ - dosed in mg/24 hours) patch 14 mg  14 mg Transdermal Daily Darreld Mclean R, MD   14 mg at 11/23/22 2128   ondansetron (ZOFRAN) tablet 4 mg  4 mg Oral Q6H PRN Charlsie Quest, MD       Or   ondansetron [facility]) injection 4 mg  4 mg Intravenous Q6H PRN Charlsie Quest, MD   4 mg at 11/24/22 9604   oxyCODONE (Oxy IR/ROXICODONE) immediate release tablet 5 mg  5 mg Oral Q6H PRN Charlsie Quest, MD   5 mg at 11/23/22 2126   pantoprazole (PROTONIX) injection 40 mg  40 mg Intravenous Q12H Darreld Mclean R, MD   40 mg at 11/23/22 2128   sodium chloride flush (NS) 0.9 % injection 3 mL  3 mL Intravenous Q12H Darreld Mclean R, MD   3 mL at 11/23/22 2128    Allergies as of 11/23/2022 - Review Complete 11/23/2022  Allergen Reaction Noted   Lisinopril  06/24/2021   Statins  06/24/2021    Social History   Socioeconomic History   Marital status: Divorced    Spouse name: Not on file   Number of children: Not on file   Years of education: Not on file   Highest education  level: Not on file  Occupational History   Not on file  Tobacco Use   Smoking status: Every Day    Packs/day: .22    Types: Cigarettes   Smokeless tobacco: Current  Vaping Use   Vaping Use: Never used  Substance and Sexual Activity   Alcohol use: Yes    Comment: occasionally   Drug use: Never   Sexual activity: Not on file  Other Topics Concern   Not on file  Social History Narrative   Not on file   Social Determinants of Health   Financial Resource Strain: High Risk (01/09/2022)   Overall Financial Resource Strain (CARDIA)    Difficulty of Paying Living Expenses: Hard  Food Insecurity: No Food Insecurity (11/24/2022)   Hunger Vital Sign    Worried About Running Out of Food in the Last Year: Never true    Ran Out of Food in the Last Year: Never true  Transportation Needs: No Transportation Needs (11/24/2022)   PRAPARE - Transportation    Lack of  Transportation (Medical): No    Lack of Transportation (Non-Medical): No  Physical Activity: Not on file  Stress: Not on file  Social Connections: Not on file  Intimate Partner Violence: Not At Risk (11/24/2022)   Humiliation, Afraid, Rape, and Kick questionnaire    Fear of Current or Ex-Partner: No    Emotionally Abused: No    Physically Abused: No    Sexually Abused: No     Code Status   Code Status: Full Code  Review of Systems: All systems reviewed and negative except where noted in HPI.  Physical Exam: Vital signs in last 24 hours: Temp:  [97.5 F (36.4 C)-98.9 F (37.2 C)] 97.9 F (36.6 C) (07/05 0718) Pulse Rate:  [69-128] 71 (07/05 0718) Resp:  [12-23] 12 (07/05 0718) BP: (124-167)/(72-92) 153/74 (07/05 0718) SpO2:  [96 %-100 %] 100 % (07/05 0718) Weight:  [71.4 kg-74.6 kg] 74 kg (07/05 0516) Last BM Date : 11/23/22  General:  Pleasant male in NAD Psych:  Cooperative. Normal mood and affect Eyes: Pupils equal Ears:  Normal auditory acuity Nose: No deformity, discharge or lesions Neck:  Supple, no masses  felt Lungs:  Clear to auscultation.  Heart:  Regular rate, regular rhythm.  Abdomen:  Soft, nondistended, nontender, active bowel sounds, no masses felt Rectal :  Deferred Msk: Symmetrical without gross deformities.  Neurologic:  Alert, oriented, grossly normal neurologically Skin:  Intact without significant lesions.    Intake/Output from previous day: 07/04 0701 - 07/05 0700 In: 1993.4 [P.O.:1650; I.V.:15.4; Blood:328] Out: 2100 [Urine:2100] Intake/Output this shift:  No intake/output data recorded.     Willette Cluster, NP-C @  11/24/2022, 8:47 AM

## 2022-11-24 NOTE — Consult Note (Signed)
WOC Nurse Consult Note: Reason for Consult:Dressing guidance is sought for left foot incision.  Patient with 5th ray amputation of left foot on 11/15/22 by Dr. Logan Bores (Podiatric Medicine). He was seen in the office on 11/21/22 by Dr. Logan Bores' associate, Dr. Annamary Rummage, who continued the post op orders of Dr. Logan Bores for dressing changes every 3-4 days. Patient is to return to the office on or about 12/05/22 for his second post op visit. Wound type:Surgical Pressure Injury POA: N/A Wound ZOX:[id number] approximated incision with twenty (20) staples intact Drainage (amount, consistency, odor) none Periwound: ecchymosis at distal third of wound Dressing procedure/placement/frequency: Post op wound care from Podiatry is for every 3-4 days betadine dressing placement and for limited weightbearing status wearing a post op shoe and ambulating on heel only.  I have provided order for nursing to apply a povidone iodine swabstick ot the incision line every other day and when dry, to cover with dry gauze dressings and secure with a few turns of Kerlix roll gauze/paper tape. The weightbearing status noted above is communicated to Nursing via a Care Order/Instruction. If patient does not have his post op shoe, Ortho Tech is to be consulted for another.  If any concerns or questions about the appearance of the incision arise during this admission, please consult Podiatric Medicine.   Recommend patient keep the next scheduled appointment with Podiatry at their office on 12/05/22.  WOC nursing team will not follow, but will remain available to this patient, the nursing and medical teams.   Thank you for inviting [place] to participate in this patient's Plan of Care.  Ladona Mow, MSN, RN, CNS, GNP, Leda Min, [employer], [employer] phone:  [phone]

## 2022-11-24 NOTE — Anesthesia Preprocedure Evaluation (Addendum)
Anesthesia Evaluation  Patient identified by MRN, date of birth, ID band Patient awake    Reviewed: Allergy & Precautions, NPO status , Patient's Chart, lab work & pertinent test results  History of Anesthesia Complications Negative for: history of anesthetic complications  Airway Mallampati: III  TM Distance: >3 FB Neck ROM: Full   Comment: Previous grade I view with MAC 4, easy mask Dental  (+) Edentulous Upper, Edentulous Lower, Dental Advisory Given   Pulmonary neg shortness of breath, neg sleep apnea, COPD, neg recent URI, Current Smoker and Patient abstained from smoking.   Pulmonary exam normal breath sounds clear to auscultation       Cardiovascular hypertension, (-) angina + CAD, + CABG (06/10/2021) and +CHF  (-) Past MI and (-) Cardiac Stents  Rhythm:Regular Rate:Normal  HLD  TTE 09/12/2022: IMPRESSIONS     1. Left ventricular ejection fraction, by estimation, is 45 to 50%. The  left ventricle has mildly decreased function. The left ventricle has no  regional wall motion abnormalities. Left ventricular diastolic parameters  are consistent with Grade I  diastolic dysfunction (impaired relaxation). The average left ventricular  global longitudinal strain is -17.7 %. The global longitudinal strain is  abnormal.   2. TAPSE 1.5. Right ventricular systolic function is mildly reduced. The  right ventricular size is normal.   3. The mitral valve is normal in structure. No evidence of mitral valve  regurgitation. No evidence of mitral stenosis.   4. The aortic valve is tricuspid. Aortic valve regurgitation is not  visualized. No aortic stenosis is present.   5. Aortic Normal DTA.   6. The inferior vena cava is normal in size with greater than 50%  respiratory variability, suggesting right atrial pressure of 3 mmHg.     Neuro/Psych neg Seizures negative neurological ROS     GI/Hepatic Neg liver ROS, PUD,GERD   Medicated,,  Endo/Other  diabetes, Type 2, Insulin Dependent, Oral Hypoglycemic Agents    Renal/GU negative Renal ROS     Musculoskeletal   Abdominal   Peds  Hematology  (+) Blood dyscrasia, anemia   Anesthesia Other Findings 57 y.o. year old male with a past medical history of DM, hypertension, CAD status post CABG , combined systolic and diastolic heart failure HLD, COPD, osteomyelitis s/p R 5th toe amputation.    Recurrent upper GI bleed on plavix + Asa. Non-bleeding gastric and duodenal ulcers on EGD in June which may be source of bleed now.  On Plavix: last dose 11/22/2022  Hgb 8.8  Reproductive/Obstetrics                             Anesthesia Physical Anesthesia Plan  ASA: 3  Anesthesia Plan: MAC   Post-op Pain Management:    Induction: Intravenous  PONV Risk Score and Plan: 0 and Propofol infusion and Treatment may vary due to age or medical condition  Airway Management Planned: Natural Airway and Nasal Cannula  Additional Equipment:   Intra-op Plan:   Post-operative Plan:   Informed Consent: I have reviewed the patients History and Physical, chart, labs and discussed the procedure including the risks, benefits and alternatives for the proposed anesthesia with the patient or authorized representative who has indicated his/her understanding and acceptance.     Dental advisory given  Plan Discussed with: CRNA and Anesthesiologist  Anesthesia Plan Comments: (Discussed with patient risks of MAC including, but not limited to, minor pain or discomfort, hearing people in the  room, and possible need for backup general anesthesia. Risks for general anesthesia also discussed including, but not limited to, sore throat, hoarse voice, chipped/damaged teeth, injury to vocal cords, nausea and vomiting, allergic reactions, lung infection, heart attack, stroke, and death. All questions answered. )        Anesthesia Quick Evaluation

## 2022-11-24 NOTE — Progress Notes (Signed)
[facility]                                                                               Kenneth Bailey, is a 57 y.o. male, DOB - 1966-05-20, JYN:[id number] Admit date - 11/23/2022    Outpatient Primary MD for the patient is [name], MD  LOS - 1  days    Brief summary   Kenneth Bailey is a 57 y.o. male with medical history significant for CAD s/p CABG, chronic systolic CHF (EF improved to 45-50%), COPD, T2DM, HTN, HLD, GI bleed due to gastric and duodenal ulcers, PAD, osteomyelitis s/p right fifth amputation who is admitted with symptomatic anemia due to upper GI bleed.  Patient with recent prolonged hospitalization 10/31/2022-11/17/2022.  Initially admitted with left foot diabetic ulcer with associated osteomyelitis of the left fifth toe.  Initially underwent I&D with resection of fifth metatarsal head 6/11.  Cultures grew MRSA.  Vascular surgery were consulted and patient underwent left posterior tibial artery and tibioperoneal trunk angioplasty on 11/03/2022.  He was started on aspirin and Plavix.   Hospitalization complicated by UGI bleed.  Underwent EGD 6/16 by Dr. Elnoria Howard which showed nonbleeding gastric and duodenal ulcers.  He required transfusion 3 units PRBCs during admission.   Patient required repeat I&D and washout of left foot 6/19.  Repeat MRI showed residual osteomyelitis and patient returned to the OR 6/26 for fifth ray amputation. Patient was seen by ID who recommended 6-week antibiotic course with Augmentin and doxycycline.   Assessment & Plan    Assessment and Plan:  Symptomatic anemia due to upper GI bleed  S/P EGD on 6/16 by Dr Elnoria Howard showing non bleeding gastric and duodenal ulcers.  S/p EGD today by Mansouraty showing one non bleeding cratered duodenal ulcer with a visible vessel, s/p epinephrine injection for hemostasis and three hemostatic clips were placed. He was also found to have Segmental moderate inflammation characterized by erosions,  erythema and granularity was found in the entire examined stomach. Please see note from GI.  Plan to continue with IV PPI BID  for 36 hours till Sunday and transition to oral PPI.  Continue with Carafate for 1 month.  Hold plavix for 48 hours.  Will start aspirin in am.  Full liquid diet today and advance diet in am.  Oral iron supplementation to be restarted in one week.  Hemoglobin stable around 8.8.    PAD s/p left posterior tibial artery and tibioperoneal trunk angioplasty 11/03/2022:  Restart aspirin in am. Plavix to be held for 48 hours.  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 Wound cultures grew MRSA and Enterococcus faecalis. On 6-week total antibiotics with Augmentin and doxycycline per ID recommendation.  To continue with Augmentin and doxycycline through 7/30.    Chronic systolic CHF/ ICM: Euvolemic on admission.    CAD s/p CABG  He denies any chest pain.    Type 2 DM;  CBG (last 3)  Recent Labs    11/24/22 1153 11/24/22 1356 11/24/22 1529  GLUCAP 95 85 80   Resume SSI.    Hypertension:  Well controlled.    COPD:  Stable.    Hyperlipidemia;  Resume Zetia.     Estimated body mass index is 24.81 kg/m as calculated from the following:   Height as of this encounter: 5\' 8"  (1.727 m).   Weight as of this encounter: 74 kg.  Code Status: full code.  DVT Prophylaxis:  SCDs Start: 11/23/22 1954   Level of Care: Level of care: Progressive Family Communication: none at bedside.   Disposition Plan:     Remains inpatient appropriate:  IV PPI.    Procedures:  EGD   Consultants:   Gastroenterology   Antimicrobials:   Anti-infectives (From admission, onward)    Start     Dose/Rate Route Frequency Ordered Stop   11/23/22 2200  [MAR Hold]  doxycycline (VIBRA-TABS)  tablet 100 mg        (MAR Hold since Fri 11/24/2022 at 1343.Hold Reason: Transfer to a Procedural area)   100 mg Oral 2 times daily 11/23/22 2021          Medications  Scheduled Meds:  [MAR Hold] doxycycline  100 mg Oral BID   [MAR Hold] ezetimibe  10 mg Oral Daily   [MAR Hold] gabapentin  600 mg Oral TID   [MAR Hold] gemfibrozil  600 mg Oral BID AC   [MAR Hold] insulin aspart  0-15 Units Subcutaneous Q4H   [MAR Hold] insulin glargine-yfgn  10 Units Subcutaneous QHS   [MAR Hold] melatonin  10 mg Oral QHS   [MAR Hold] nicotine  14 mg Transdermal Daily   [MAR Hold] pantoprazole (PROTONIX) IV  40 mg Intravenous Q12H   [MAR Hold] sodium chloride flush  3 mL Intravenous Q12H   Continuous Infusions:  sodium chloride     dextrose 75 mL/hr at 11/24/22 0824   PRN Meds:.[MAR Hold] acetaminophen **OR** [MAR Hold] acetaminophen, [MAR Hold] albuterol, [MAR Hold]  morphine injection, [MAR Hold] ondansetron **OR** [MAR Hold] ondansetron (ZOFRAN) IV, [MAR Hold] oxyCODONE    Subjective:   Kenneth Bailey was seen and examined today.  No new complaints this morning. No chest pain or sob, nausea, vomiting or abdominal pain.   Objective:   Vitals:   11/24/22 1155 11/24/22 1352 11/24/22 1400 11/24/22 1515  BP: 120/84 (!) 151/91 (!) 143/83 115/72  Pulse: 73 73 71 76  Resp: (!) 24 14 10 11   Temp: 98.7 F (37.1 C) 98.5 F (36.9 C)    TempSrc: Oral Temporal    SpO2: 100% 100% 100% 100%  Weight:      Height:        Intake/Output Summary (Last 24 hours) at 11/24/2022 1537 Last data filed at 11/24/2022 1508 Gross per 24 hour  Intake 2293.4 ml  Output 3000 ml  Net -706.6 ml   Filed Weights   11/23/22 1402 11/23/22 2102 11/24/22 0516  Weight: 71.4 kg 74.6 kg 74 kg     Exam General exam: Appears calm and comfortable  Respiratory system: Clear to auscultation. Respiratory effort normal. Cardiovascular system: S1 & S2 heard, RRR. No JVD,  Gastrointestinal system: Abdomen is nondistended,  soft and non tender Central nervous system: Alert and oriented. No focal neurological deficits. Extremities: S/p left fifth ray amputation, wound dressing in place  Skin: No rashes,  Psychiatry:Mood & affect appropriate.     Data Reviewed:  I have personally reviewed following labs and imaging studies   CBC Lab Results  Component Value Date  WBC 6.2 11/24/2022   RBC 3.22 (L) 11/24/2022   HGB 8.8 (L) 11/24/2022   HCT 27.7 (L) 11/24/2022   MCV 86.0 11/24/2022   MCH 27.3 11/24/2022   PLT 307 11/24/2022   MCHC 31.8 11/24/2022   RDW 14.6 11/24/2022   LYMPHSABS 1.8 11/17/2022   MONOABS 0.6 11/17/2022   EOSABS 0.2 11/17/2022   BASOSABS 0.1 11/17/2022     Last metabolic panel Lab Results  Component Value Date   NA 138 11/24/2022   K 4.7 11/24/2022   CL 99 11/24/2022   CO2 25 11/24/2022   BUN 23 (H) 11/24/2022   CREATININE 0.99 11/24/2022   GLUCOSE 74 11/24/2022   GFRNONAA >60 11/24/2022   CALCIUM 9.8 11/24/2022   PHOS 4.9 (H) 11/17/2022   PROT 7.4 11/23/2022   ALBUMIN 3.1 (L) 11/23/2022   BILITOT 0.4 11/23/2022   ALKPHOS 110 11/23/2022   AST 15 11/23/2022   ALT 11 11/23/2022   ANIONGAP 14 11/24/2022    CBG (last 3)  Recent Labs    11/24/22 0742 11/24/22 1153 11/24/22 1356  GLUCAP 74 95 85      Coagulation Profile: No results for input(s): "INR", "PROTIME" in the last 168 hours.   Radiology Studies: No results found.     Kathlen Mody M.D. [facility] 11/24/2022, 3:37 PM  Available via Epic secure chat 7am-7pm After 7 pm, please refer to night coverage provider listed on amion.

## 2022-11-24 NOTE — Transfer of Care (Signed)
Immediate Anesthesia Transfer of Care Note  Patient: Kenneth Bailey  Procedure(s) Performed: ESOPHAGOGASTRODUODENOSCOPY (EGD) HOT HEMOSTASIS (ARGON PLASMA COAGULATION/BICAP) HEMOSTASIS CONTROL HEMOSTASIS CLIP PLACEMENT  Patient Location: PACU  Anesthesia Type:MAC  Level of Consciousness: drowsy  Airway & Oxygen Therapy: Patient Spontanous Breathing and Patient connected to nasal cannula oxygen  Post-op Assessment: Report given to RN and Post -op Vital signs reviewed and stable  Post vital signs: Reviewed and stable  Last Vitals:  Vitals Value Taken Time  BP 115/72 11/24/22 1512  Temp    Pulse 77 11/24/22 1515  Resp 12 11/24/22 1515  SpO2 100 % 11/24/22 1515  Vitals shown include unvalidated device data.  Last Pain:  Vitals:   11/24/22 1352  TempSrc: Temporal  PainSc: 0-No pain      Patients Stated Pain Goal: 0 (11/24/22 1037)  Complications: No notable events documented.

## 2022-11-24 NOTE — H&P (Signed)
GASTROENTEROLOGY PROCEDURE H&P NOTE   Primary Care Physician: [name], MD  HPI: Kenneth Bailey is a 57 y.o. male who presents for EGD for evaluation of acute on chronic anemia and dark stools with previous recent history of Esophagitis, Gastritis, Duodenitis with ulcer disease as well and he is on DAPT.  Past Medical History:  Diagnosis Date   COPD (chronic obstructive pulmonary disease) (HCC)    Diabetes mellitus, type 2 (HCC)    HLD (hyperlipidemia)    Hypertension    Tobacco abuse    Past Surgical History:  Procedure Laterality Date   ABDOMINAL AORTOGRAM W/LOWER EXTREMITY N/A 11/03/2022   Procedure: ABDOMINAL AORTOGRAM W/LOWER EXTREMITY;  Surgeon: Cephus Shelling, MD;  Location: [facility];  Service: Cardiovascular;  Laterality: N/A;   AMPUTATION Left 10/31/2022   Procedure: AMPUTATION LEFT FIFTH TOE;  Surgeon: Louann Sjogren, DPM;  Location: [facility] OR;  Service: Podiatry;  Laterality: Left;   AMPUTATION Left 11/15/2022   Procedure: AMPUTATION FITH RAY;  Surgeon: Felecia Shelling, DPM;  Location: [facility] OR;  Service: Podiatry;  Laterality: Left;   AMPUTATION TOE Left 10/31/2022   Procedure: IRRIGATION AND DEBRIDEMENTOF LEFT FOOT;  Surgeon: Louann Sjogren, DPM;  Location: [facility] OR;  Service: Podiatry;  Laterality: Left;   BIOPSY  11/05/2022   Procedure: BIOPSY;  Surgeon: Jeani Hawking, MD;  Location: [facility] ENDOSCOPY;  Service: Gastroenterology;;   CORONARY ARTERY BYPASS GRAFT N/A 06/10/2021   Procedure: CORONARY ARTERY BYPASS GRAFTING (CABG) TIMES FOUR, USING LEFT INTERNAL MAMMARY ARTERY AND RIGHT GREATER SAPHENOUS VEIN HARVESTED ENDOSCOPICALLY;  Surgeon: Lovett Sox, MD;  Location: [facility] OR;  Service: Open Heart Surgery;  Laterality: N/A;   ENDOVEIN HARVEST OF GREATER SAPHENOUS VEIN Right 06/10/2021   Procedure: ENDOVEIN HARVEST OF GREATER SAPHENOUS VEIN;  Surgeon: Lovett Sox, MD;  Location: [facility] OR;  Service: Open Heart Surgery;  Laterality: Right;    ESOPHAGOGASTRODUODENOSCOPY (EGD) WITH PROPOFOL N/A 11/05/2022   Procedure: ESOPHAGOGASTRODUODENOSCOPY (EGD) WITH PROPOFOL;  Surgeon: Jeani Hawking, MD;  Location: [facility] ENDOSCOPY;  Service: Gastroenterology;  Laterality: N/A;   I & D EXTREMITY Left 11/08/2022   Procedure: IRRIGATION AND DEBRIDEMENT OF FOOT AND WASHOUT;  Surgeon: Vivi Barrack, DPM;  Location: [facility] OR;  Service: Podiatry;  Laterality: Left;   PERIPHERAL VASCULAR BALLOON ANGIOPLASTY  11/03/2022   Procedure: PERIPHERAL VASCULAR BALLOON ANGIOPLASTY;  Surgeon: Cephus Shelling, MD;  Location: [facility];  Service: Cardiovascular;;   RIGHT/LEFT HEART CATH AND CORONARY ANGIOGRAPHY N/A 06/06/2021   Procedure: RIGHT/LEFT HEART CATH AND CORONARY ANGIOGRAPHY;  Surgeon: Dolores Patty, MD;  Location: [facility];  Service: Cardiovascular;  Laterality: N/A;   TEE WITHOUT CARDIOVERSION N/A 06/10/2021   Procedure: TRANSESOPHAGEAL ECHOCARDIOGRAM (TEE);  Surgeon: Lovett Sox, MD;  Location: [facility] OR;  Service: Open Heart Surgery;  Laterality: N/A;   Current Facility-Administered Medications  Medication Dose Route Frequency Provider Last Rate Last Admin   0.9 %  sodium chloride infusion   Intravenous Continuous Mansouraty, Netty Starring., MD       [MAR Hold] acetaminophen (TYLENOL) tablet 650 mg  650 mg Oral Q6H PRN Charlsie Quest, MD       Or   Mitzi Hansen Hold] acetaminophen (TYLENOL) suppository 650 mg  650 mg Rectal Q6H PRN Charlsie Quest, MD       [MAR Hold] albuterol (PROVENTIL) (2.5 MG/3ML) 0.083% nebulizer solution 2.5 mg  2.5 mg Nebulization Q6H PRN Charlsie Quest, MD       dextrose 5 %  solution   Intravenous Continuous Kathlen Mody, MD 75 mL/hr at 11/24/22 0824 New Bag at 11/24/22 0824   [MAR Hold] doxycycline (VIBRA-TABS) tablet 100 mg  100 mg Oral BID Darreld Mclean R, MD   100 mg at 11/24/22 0911   [MAR Hold] ezetimibe (ZETIA) tablet 10 mg  10 mg Oral Daily Charlsie Quest, MD   10 mg at 11/24/22 0911   [MAR Hold]  gabapentin (NEURONTIN) capsule 600 mg  600 mg Oral TID Charlsie Quest, MD   600 mg at 11/24/22 0911   [MAR Hold] gemfibrozil (LOPID) tablet 600 mg  600 mg Oral BID AC Charlsie Quest, MD       [MAR Hold] insulin aspart (novoLOG) injection 0-15 Units  0-15 Units Subcutaneous Q4H Charlsie Quest, MD   2 Units at 11/23/22 2125   [facility] Hold] insulin glargine-yfgn (SEMGLEE) injection 10 Units  10 Units Subcutaneous QHS Charlsie Quest, MD       lactated ringers infusion    Continuous PRN Mansouraty, Netty Starring., MD   1,000 mL at 11/24/22 1357   [MAR Hold] melatonin tablet 10 mg  10 mg Oral QHS Darreld Mclean R, MD   10 mg at 11/23/22 2127   [facility] Hold] morphine (PF) 2 MG/ML injection 2-4 mg  2-4 mg Intravenous Q4H PRN Hillary Bow, DO   4 mg at 11/24/22 1040   [MAR Hold] nicotine (NICODERM CQ - dosed in mg/24 hours) patch 14 mg  14 mg Transdermal Daily Darreld Mclean R, MD   14 mg at 11/24/22 0917   [MAR Hold] ondansetron (ZOFRAN) tablet 4 mg  4 mg Oral Q6H PRN Charlsie Quest, MD       Or   Mitzi Hansen Hold] ondansetron (ZOFRAN) injection 4 mg  4 mg Intravenous Q6H PRN Charlsie Quest, MD   4 mg at 11/24/22 0817   [MAR Hold] oxyCODONE (Oxy IR/ROXICODONE) immediate release tablet 5 mg  5 mg Oral Q6H PRN Charlsie Quest, MD   5 mg at 11/24/22 0922   [MAR Hold] pantoprazole (PROTONIX) injection 40 mg  40 mg Intravenous Q12H Darreld Mclean R, MD   40 mg at 11/24/22 0912   [MAR Hold] sodium chloride flush (NS) 0.9 % injection 3 mL  3 mL Intravenous Q12H Darreld Mclean R, MD   3 mL at 11/23/22 2128    Current Facility-Administered Medications:    0.9 %  sodium chloride infusion, , Intravenous, Continuous, Mansouraty, Netty Starring., MD   Mitzi Hansen Hold] acetaminophen (TYLENOL) tablet 650 mg, 650 mg, Oral, Q6H PRN **OR** [MAR Hold] acetaminophen (TYLENOL) suppository 650 mg, 650 mg, Rectal, Q6H PRN, Charlsie Quest, MD   [MAR Hold] albuterol (PROVENTIL) (2.5 MG/3ML) 0.083% nebulizer solution 2.5 mg, 2.5 mg, Nebulization,  Q6H PRN, Allena Katz, Vishal R, MD   dextrose 5 % solution, , Intravenous, Continuous, Blake Divine, Vijaya, MD, Last Rate: 75 mL/hr at 11/24/22 0824, New Bag at 11/24/22 0824   [MAR Hold] doxycycline (VIBRA-TABS) tablet 100 mg, 100 mg, Oral, BID, Darreld Mclean R, MD, 100 mg at 11/24/22 0911   [MAR Hold] ezetimibe (ZETIA) tablet 10 mg, 10 mg, Oral, Daily, Darreld Mclean R, MD, 10 mg at 11/24/22 0911   [MAR Hold] gabapentin (NEURONTIN) capsule 600 mg, 600 mg, Oral, TID, Darreld Mclean R, MD, 600 mg at 11/24/22 0911   [MAR Hold] gemfibrozil (LOPID) tablet 600 mg, 600 mg, Oral, BID AC, Patel, Floreen Comber, MD   [MAR Hold] insulin aspart (novoLOG) injection 0-15 Units, 0-15  Units, Subcutaneous, Q4H, Darreld Mclean R, MD, 2 Units at 11/23/22 2125   [facility] Hold] insulin glargine-yfgn (SEMGLEE) injection 10 Units, 10 Units, Subcutaneous, QHS, Patel, Vishal R, MD   lactated ringers infusion, , , Continuous PRN, Mansouraty, Netty Starring., MD, 1,000 mL at 11/24/22 1357   [MAR Hold] melatonin tablet 10 mg, 10 mg, Oral, QHS, Darreld Mclean R, MD, 10 mg at 11/23/22 2127   [facility] Hold] morphine (PF) 2 MG/ML injection 2-4 mg, 2-4 mg, Intravenous, Q4H PRN, Hillary Bow, DO, 4 mg at 11/24/22 1040   [MAR Hold] nicotine (NICODERM CQ - dosed in mg/24 hours) patch 14 mg, 14 mg, Transdermal, Daily, Darreld Mclean R, MD, 14 mg at 11/24/22 0917   [MAR Hold] ondansetron (ZOFRAN) tablet 4 mg, 4 mg, Oral, Q6H PRN **OR** [MAR Hold] ondansetron (ZOFRAN) injection 4 mg, 4 mg, Intravenous, Q6H PRN, Darreld Mclean R, MD, 4 mg at 11/24/22 0817   [MAR Hold] oxyCODONE (Oxy IR/ROXICODONE) immediate release tablet 5 mg, 5 mg, Oral, Q6H PRN, Darreld Mclean R, MD, 5 mg at 11/24/22 0922   [MAR Hold] pantoprazole (PROTONIX) injection 40 mg, 40 mg, Intravenous, Q12H, Patel, Vishal R, MD, 40 mg at 11/24/22 0912   [MAR Hold] sodium chloride flush (NS) 0.9 % injection 3 mL, 3 mL, Intravenous, Q12H, Darreld Mclean R, MD, 3 mL at 11/23/22 2128 Allergies  Allergen  Reactions   Lisinopril     Leg swelling   Statins     Leg swelling   Family History  Problem Relation Age of Onset   Hypertension Mother    Cancer Mother    Heart disease Father    Social History   Socioeconomic History   Marital status: Divorced    Spouse name: Not on file   Number of children: Not on file   Years of education: Not on file   Highest education level: Not on file  Occupational History   Not on file  Tobacco Use   Smoking status: Every Day    Packs/day: .22    Types: Cigarettes   Smokeless tobacco: Current  Vaping Use   Vaping Use: Never used  Substance and Sexual Activity   Alcohol use: Yes    Comment: occasionally   Drug use: Never   Sexual activity: Not on file  Other Topics Concern   Not on file  Social History Narrative   Not on file   Social Determinants of Health   Financial Resource Strain: High Risk (01/09/2022)   Overall Financial Resource Strain (CARDIA)    Difficulty of Paying Living Expenses: Hard  Food Insecurity: No Food Insecurity (11/24/2022)   Hunger Vital Sign    Worried About Running Out of Food in the Last Year: Never true    Ran Out of Food in the Last Year: Never true  Transportation Needs: No Transportation Needs (11/24/2022)   PRAPARE - Administrator, Civil Service (Medical): No    Lack of Transportation (Non-Medical): No  Physical Activity: Not on file  Stress: Not on file  Social Connections: Not on file  Intimate Partner Violence: Not At Risk (11/24/2022)   Humiliation, Afraid, Rape, and Kick questionnaire    Fear of Current or Ex-Partner: No    Emotionally Abused: No    Physically Abused: No    Sexually Abused: No    Physical Exam: Today's Vitals   11/24/22 1037 11/24/22 1110 11/24/22 1155 11/24/22 1352  BP:   120/84 (!) 151/91  Pulse:   73 73  Resp:   (!) 24 14  Temp:   98.7 F (37.1 C) 98.5 F (36.9 C)  TempSrc:   Oral Temporal  SpO2:   100% 100%  Weight:      Height:      PainSc: 6  Asleep   0-No pain   Body mass index is 24.81 kg/m. GEN: NAD EYE: Sclerae anicteric ENT: MMM CV: Non-tachycardic GI: Soft, NT/ND NEURO:  Alert & Oriented x 3  Lab Results: Recent Labs    11/23/22 1442 11/23/22 1730 11/24/22 0632  WBC 6.9 6.6 6.2  HGB 9.5* 7.8* 8.8*  HCT 29.7* 25.3* 27.7*  PLT 414* 326 307   BMET Recent Labs    11/23/22 1442 11/24/22 0632  NA 140 138  K 4.6 4.7  CL 107 99  CO2 19* 25  GLUCOSE 150* 74  BUN 33* 23*  CREATININE 1.24 0.99  CALCIUM 9.6 9.8   LFT Recent Labs    11/23/22 1442  PROT 7.4  ALBUMIN 3.1*  AST 15  ALT 11  ALKPHOS 110  BILITOT 0.4   PT/INR No results for input(s): "LABPROT", "INR" in the last 72 hours.   Impression / Plan: This is a 57 y.o.male who presents for EGD for evaluation of acute on chronic anemia and dark stools with previous recent history of Esophagitis, Gastritis, Duodenitis with ulcer disease as well and he is on DAPT.  The risks and benefits of endoscopic evaluation/treatment were discussed with the patient and/or family; these include but are not limited to the risk of perforation, infection, bleeding, missed lesions, lack of diagnosis, severe illness requiring hospitalization, as well as anesthesia and sedation related illnesses.  The patient's history has been reviewed, patient examined, no change in status, and deemed stable for procedure.  The patient and/or family is agreeable to proceed.    Corliss Parish, MD [facility] Advanced [facility]oscopy Office # [phone]

## 2022-11-24 NOTE — Op Note (Signed)
[facility] Patient Name: Kenneth Bailey Procedure Date : 11/24/2022 MRN: [id number] Attending MD: Corliss Parish , MD, [id number] Date of Birth: July 28, 1965 CSN: [id number] Age: 57 Admit Type: Inpatient Procedure:                Upper GI endoscopy Indications:              Acute post hemorrhagic anemia, Iron deficiency                            anemia, Melena, Occult blood in stool, Follow-up of                            peptic ulcer Providers:                Corliss Parish, MD, Fransisca Connors, Cephus Richer, RN, Marja Kays, Technician Referring MD:             Inpatient medical service Medicines:                Monitored Anesthesia Care Complications:            No immediate complications. Estimated Blood Loss:     Estimated blood loss was minimal. Procedure:                Pre-Anesthesia Assessment:                           - Prior to the procedure, a History and Physical                            was performed, and patient medications and                            allergies were reviewed. The patient's tolerance of                            previous anesthesia was also reviewed. The risks                            and benefits of the procedure and the sedation                            options and risks were discussed with the patient.                            All questions were answered, and informed consent                            was obtained. Prior Anticoagulants: The patient                            last took Plavix (clopidogrel) 2 days prior to the  procedure and has taken no anticoagulant or                            antiplatelet agents except for aspirin. ASA Grade                            Assessment: III - A patient with severe systemic                            disease. After reviewing the risks and benefits,                            the patient was deemed in satisfactory  condition to                            undergo the procedure.                           After obtaining informed consent, the endoscope was                            passed under direct vision. Throughout the                            procedure, the patient's blood pressure, pulse, and                            oxygen saturations were monitored continuously. The                            [id number] ([id number]) Olympus endoscope was                            introduced through the mouth, and advanced to the                            second part of duodenum. The upper GI endoscopy was                            accomplished without difficulty. The patient                            tolerated the procedure. Scope In: Scope Out: Findings:      No gross lesions were noted in the entire esophagus. This is improved       from the previous reported significant grade D esophagitis.      The Z-line was irregular and was found 36 cm from the incisors.      A 2 cm hiatal hernia was present.      Segmental moderate inflammation characterized by erosions, erythema and       granularity was found in the entire examined stomach. Biopsies had       previously been formed and negative for H. pylori so they were not       redone. I cannot tell what difference this is  from the previous       endoscopy to now as I did not perform the previous 1. But there is no       evidence of any gastric ulcers.      One non-bleeding cratered duodenal ulcer with a visible vessel was found       in the duodenal bulb. The lesion was 13 mm in largest dimension. Area       was successfully injected with 2 mL of a 1:100,000 solution of       epinephrine for hemostasis. Fulguration to ablate the lesion by bipolar       probe was successful. To further prevent bleeding post-intervention       (especially in the setting of needing to go back on antiplatelet therapy       sooner rather than later), three hemostatic clips  were successfully       placed (MR conditional). Clip manufacturer: [employer]. There was       no bleeding during, or at the end, of the procedure.      A mild post-ulcer angulation deformity was found at the sweep causing a       mild narrowing (but the scope passes with ease).      No gross lesions were noted in the second portion of the duodenum. Impression:               - No gross lesions in the entire esophagus. Z-line                            irregular, 36 cm from the incisors.                           - 2 cm hiatal hernia.                           - Gastritis.                           - Non-bleeding duodenal ulcer with a nonbleeding                            visible vessel found in the bulb. Injected with                            epinephrine. Treated with bipolar cautery. Clips                            (MR conditional) were placed. Clip manufacturer:                            [employer].                           - Duodenal deformity at the duodenal sweep.                           - No gross lesions in the second portion of the  duodenum.                           - Patient rectal exam shows light brown stool today. Recommendation:           - The patient will be observed post-procedure,                            until all discharge criteria are met.                           - Return patient to hospital ward for ongoing care.                           - Full liquid diet today.                           - Continue IV Protonix twice daily for 36 more                            hours then may transition back to p.o. 40 mg PPI                            twice daily.                           - Carafate before every meal plus nightly for 1                            month then twice daily for 1 month.                           - Able to hold antiplatelet therapy (Plavix for at                            least 48 hours that would  be ideal). Okay for                            aspirin to continue at this time. This will help                            decrease the risk for post interventional bleeding.                           - Trend hemoglobin/hematocrit.                           - Recommend patient receive IV iron while in-house                            to bump his stores.                           - Oral iron to be restarted in 1 week (ferrous  gluconate or ferrous sulfate daily).                           - Unless the patient shows transfusion dependent                            anemia with recurrent overt bleeding (2 melena or                            hematochezia or maroon stools) no further GI                            interventions or procedures will be required.                            Please call the inpatient GI service if needed, we                            will sign off at this time.                           - The findings and recommendations were discussed                            with the patient.                           - The findings and recommendations were discussed                            with the referring physician. Procedure Code(s):        --- Professional ---                           [phone], Esophagogastroduodenoscopy, flexible,                            transoral; with ablation of tumor(s), polyp(s), or                            other lesion(s) (includes pre- and post-dilation                            and guide wire passage, when performed)                           [id number], 59, Esophagogastroduodenoscopy, flexible,                            transoral; with control of bleeding, any method Diagnosis Code(s):        --- Professional ---                           K22.89, Other specified disease of esophagus  K44.9, Diaphragmatic hernia without obstruction or                            gangrene                            K29.70, Gastritis, unspecified, without bleeding                           K26.4, Chronic or unspecified duodenal ulcer with                            hemorrhage                           K31.89, Other diseases of stomach and duodenum                           D62, Acute posthemorrhagic anemia                           D50.9, Iron deficiency anemia, unspecified                           K92.1, Melena (includes Hematochezia)                           R19.5, Other fecal abnormalities                           K27.9, Peptic ulcer, site unspecified, unspecified                            as acute or chronic, without hemorrhage or                            perforation CPT copyright 2022 [employer]. All rights reserved. The codes documented in this report are preliminary and upon coder review may  be revised to meet current compliance requirements. Corliss Parish, MD 11/24/2022 3:25:12 PM Number of Addenda: 0

## 2022-11-25 DIAGNOSIS — K279 Peptic ulcer, site unspecified, unspecified as acute or chronic, without hemorrhage or perforation: Secondary | ICD-10-CM

## 2022-11-25 DIAGNOSIS — K921 Melena: Principal | ICD-10-CM

## 2022-11-25 DIAGNOSIS — R195 Other fecal abnormalities: Secondary | ICD-10-CM

## 2022-11-25 DIAGNOSIS — D649 Anemia, unspecified: Secondary | ICD-10-CM

## 2022-11-25 DIAGNOSIS — K922 Gastrointestinal hemorrhage, unspecified: Secondary | ICD-10-CM | POA: Diagnosis not present

## 2022-11-25 LAB — GLUCOSE, CAPILLARY
Glucose-Capillary: 159 mg/dL — ABNORMAL HIGH (ref 70–99)
Glucose-Capillary: 215 mg/dL — ABNORMAL HIGH (ref 70–99)
Glucose-Capillary: 204 mg/dL — ABNORMAL HIGH (ref 70–99)
Glucose-Capillary: 186 mg/dL — ABNORMAL HIGH (ref 70–99)

## 2022-11-25 MED ORDER — ASPIRIN 81 MG PO TBEC
81.0000 mg | DELAYED_RELEASE_TABLET | Freq: Every day | ORAL | Status: DC
  Administered 2022-11-25 – 2022-11-27 (×3): 81 mg via ORAL
  Filled 2022-11-25 (×3): qty 1

## 2022-11-25 MED ORDER — AMOXICILLIN-POT CLAVULANATE 875-125 MG PO TABS
1.0000 | ORAL_TABLET | Freq: Two times a day (BID) | ORAL | Status: DC
  Administered 2022-11-25 – 2022-11-27 (×5): 1 via ORAL
  Filled 2022-11-25 (×5): qty 1

## 2022-11-25 NOTE — Progress Notes (Signed)
PROGRESS NOTE    Kenneth Bailey  ZOX:[id number] DOB: 1966/02/22 DOA: 11/23/2022 PCP: [name], MD   Brief Narrative:  Kenneth Bailey is a 57 y.o. male with medical history significant for CAD s/p CABG, chronic systolic CHF (EF improved to 45-50%), COPD, T2DM, HTN, HLD, GI bleed due to gastric and duodenal ulcers, PAD, osteomyelitis s/p right fifth amputation who is admitted with symptomatic anemia due to recurrent upper GI bleed.   Patient with recent prolonged hospitalization 10/31/2022-11/17/2022 admitted for diabetic foot ulcer on the left foot with associated osteo of the left fifth toe status post resection -hospitalization complicated by upper GI bleed with EGD on 6/16 showing nonbleeding gastric and duodenal ulcers, multiple, requiring multiple units of transfusion.  Patient ultimately stabilized and discharged with continued Augmentin and doxycycline course for 6 weeks total through 12/19/2022.  Assessment & Plan:   Principal Problem:   Acute upper GI bleeding Active Problems:   Hx of CABG   Atherosclerotic heart disease   Tobacco use   Hypertension associated with diabetes (HCC)   Hyperlipidemia associated with type 2 diabetes mellitus (HCC)   Diabetes mellitus, type 2 (HCC)   COPD (chronic obstructive pulmonary disease) (HCC)   Chronic systolic CHF (congestive heart failure) (HCC)   Melena   Dark stools   PUD (peptic ulcer disease)   Symptomatic anemia   Symptomatic anemia due to upper GI bleed  - EGD on 6/16 by Dr Elnoria Howard at previous hospitalization showing non bleeding gastric and duodenal ulcers.  -EGD 11/24/2022 shows 1 nonbleeding duodenal ulcer with visible vessel status post epinephrine injection and hemostatic clips(see GI Op note for full details). -IV PPI through 7/724 transitioning to p.o; continue Carafate x 4 weeks -Plavix/aspirin to resume as below Continue with Carafate for 1 month.  Full liquid diet -will advance as tolerated Oral iron  supplementation to be restarted in one week.  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 Wound cultures grew MRSA and Enterococcus faecalis. On 6-week total antibiotics with Augmentin and doxycycline per ID recommendation.  Continue Augmentin and doxycycline through 7/30.    Chronic systolic CHF/ ICM: Euvolemic on admission.  EF 45-50% with no regional wall abnormalities, grade 1 diastolic dysfunction (echo 09/12/2022)   CAD s/p CABG  He denies any chest pain.    Insulin-dependent diabetes, uncontrolled with hyperglycemia -A1C 10.4 two months ago -Continue aggressive glucose control in the setting of above -Sliding scale insulin, hypoglycemic protocol. -Lengthy discussion on dietary and lifestyle changes  Hypertension:  Well controlled.   COPD, chronic, without acute exacerbation:  Stable.  Not currently on any inhalers or steroids per chart review   Hyperlipidemia;  Continue Zetia, reports statin allergy   Estimated body mass index is 24.7 kg/m as calculated from the following:   Height as of this encounter: 5\' 8"  (1.727 m).   Weight as of this encounter: 73.7 kg.     DVT prophylaxis: SCDs Start: 11/23/22 1954 Code Status:   Code Status: Full Code Family Communication: None present  Status is: Inpatient  Dispo: The patient is from: Home  Anticipated d/c is to: To be determined              Anticipated d/c date is: 48 to 72 hours              Patient currently not medically stable for discharge  Consultants:  GI  Procedures:  EGD with epinephrine  injection as above  Antimicrobials:  Augmentin/doxycycline per prior hospitalization  Subjective: No acute issues or events overnight denies nausea vomiting diarrhea constipation any fevers chills chest pain.  No further episodes of melena bright red blood per rectum hematemesis or other signs of bleeding.  Objective: Vitals:   11/24/22 1900 11/24/22 2339 11/25/22 0356 11/25/22 0624  BP: (!) 150/72 120/64 (!) 153/79   Pulse: 75 71 72 84  Resp: 17 17 15 20   Temp: 97.9 F (36.6 C) 98 F (36.7 C) 97.6 F (36.4 C)   TempSrc: Oral Oral Oral   SpO2: 97% 97% 100% 100%  Weight:    73.7 kg  Height:        Intake/Output Summary (Last 24 hours) at 11/25/2022 0759 Last data filed at 11/25/2022 1610 Gross per 24 hour  Intake 1825.53 ml  Output 4250 ml  Net -2424.47 ml   Filed Weights   11/23/22 2102 11/24/22 0516 11/25/22 0624  Weight: 74.6 kg 74 kg 73.7 kg    Examination:  General:  Pleasantly resting in bed, No acute distress. HEENT:  Normocephalic atraumatic.  Sclerae nonicteric, noninjected.  Extraocular movements intact bilaterally. Neck:  Without mass or deformity.  Trachea is midline. Lungs:  Clear to auscultate bilaterally without rhonchi, wheeze, or rales. Heart:  Regular rate and rhythm.  Without murmurs, rubs, or gallops. Abdomen:  Soft, nontender, nondistended.  Without guarding or rebound. Extremities: Without cyanosis, clubbing, edema, or obvious deformity. Skin:  Warm and dry, no erythema.  Data Reviewed: I have personally reviewed following labs and imaging studies  CBC: Recent Labs  Lab 11/23/22 1442 11/23/22 1730 11/24/22 0632  WBC 6.9 6.6 6.2  HGB 9.5* 7.8* 8.8*  HCT 29.7* 25.3* 27.7*  MCV 90.8 92.0 86.0  PLT 414* 326 307   Basic Metabolic Panel: Recent Labs  Lab 11/23/22 1442 11/24/22 0632  NA 140 138  K 4.6 4.7  CL 107 99  CO2 19* 25  GLUCOSE 150* 74  BUN 33* 23*  CREATININE 1.24 0.99  CALCIUM 9.6 9.8   GFR: Estimated Creatinine  Clearance: 80.6 mL/min (by C-G formula based on SCr of 0.99 mg/dL). Liver Function Tests: Recent Labs  Lab 11/23/22 1442  AST 15  ALT 11  ALKPHOS 110  BILITOT 0.4  PROT 7.4  ALBUMIN 3.1*   CBG: Recent Labs  Lab 11/24/22 1529 11/24/22 1705 11/24/22 2040 11/24/22 2250 11/25/22 0622  GLUCAP 80 173* 432* 323* 159*    No results found for this or any previous visit (from the past 240 hour(s)).   Radiology Studies: No results found.  Scheduled Meds:  doxycycline  100 mg Oral BID   ezetimibe  10 mg Oral Daily   gabapentin  600 mg Oral TID   gemfibrozil  600 mg Oral BID AC   insulin aspart  0-15 Units Subcutaneous TID WC   insulin aspart  0-5 Units Subcutaneous QHS   insulin glargine-yfgn  10 Units Subcutaneous QHS   melatonin  10 mg Oral QHS   nicotine  21 mg Transdermal Daily   pantoprazole (PROTONIX) IV  40 mg Intravenous Q12H   sodium chloride flush  3 mL Intravenous  Q12H   Continuous Infusions:   LOS: 2 days   Time spent:  Azucena Fallen, DO [facility]  If 7PM-7AM, please contact night-coverage [email or web address]  11/25/2022, 7:59 AM

## 2022-11-26 DIAGNOSIS — K922 Gastrointestinal hemorrhage, unspecified: Secondary | ICD-10-CM | POA: Diagnosis not present

## 2022-11-26 LAB — CBC
HCT: 28.9 % — ABNORMAL LOW (ref 39.0–52.0)
Hemoglobin: 9.3 g/dL — ABNORMAL LOW (ref 13.0–17.0)
MCH: 27.9 pg (ref 26.0–34.0)
MCHC: 32.2 g/dL (ref 30.0–36.0)
MCV: 86.8 fL (ref 80.0–100.0)
Platelets: 379 10*3/uL (ref 150–400)
RBC: 3.33 MIL/uL — ABNORMAL LOW (ref 4.22–5.81)
RDW: 14.3 % (ref 11.5–15.5)
WBC: 7.4 10*3/uL (ref 4.0–10.5)
nRBC: 0 % (ref 0.0–0.2)

## 2022-11-26 LAB — GLUCOSE, CAPILLARY
Glucose-Capillary: 126 mg/dL — ABNORMAL HIGH (ref 70–99)
Glucose-Capillary: 194 mg/dL — ABNORMAL HIGH (ref 70–99)
Glucose-Capillary: 162 mg/dL — ABNORMAL HIGH (ref 70–99)

## 2022-11-26 MED ORDER — LACTATED RINGERS IV SOLN: Freq: Once | INTRAVENOUS | Status: DC

## 2022-11-26 MED ORDER — SENNOSIDES-DOCUSATE SODIUM 8.6-50 MG PO TABS
1.0000 | ORAL_TABLET | Freq: Two times a day (BID) | ORAL | Status: DC
  Administered 2022-11-26 – 2022-11-27 (×3): 1 via ORAL
  Filled 2022-11-26 (×3): qty 1

## 2022-11-26 MED ORDER — POLYETHYLENE GLYCOL 3350 17 G PO PACK
17.0000 g | PACK | Freq: Two times a day (BID) | ORAL | Status: DC
  Administered 2022-11-26 (×2): 17 g via ORAL
  Filled 2022-11-26 (×2): qty 1

## 2022-11-26 MED ORDER — PANTOPRAZOLE SODIUM 40 MG PO TBEC
40.0000 mg | DELAYED_RELEASE_TABLET | Freq: Two times a day (BID) | ORAL | Status: DC
  Administered 2022-11-26 – 2022-11-27 (×2): 40 mg via ORAL
  Filled 2022-11-26 (×2): qty 1

## 2022-11-26 NOTE — Progress Notes (Signed)
PROGRESS NOTE    Kenneth Bailey  NWG:[id number] DOB: [birth year] DOA: 11/23/2022 PCP: [name], MD   Brief Narrative:  Din Alhassan is a 57 y.o. male with medical history significant for CAD s/p CABG, chronic systolic CHF (EF improved to 45-50%), COPD, T2DM, HTN, HLD, GI bleed due to gastric and duodenal ulcers, PAD, osteomyelitis s/p right fifth amputation who is admitted with symptomatic anemia due to recurrent upper GI bleed.   Patient with recent prolonged hospitalization 10/31/2022-11/17/2022 admitted for diabetic foot ulcer on the left foot with associated osteo of the left fifth toe status post resection -hospitalization complicated by upper GI bleed with EGD on 6/16 showing nonbleeding gastric and duodenal ulcers, multiple, requiring multiple units of transfusion.  Patient ultimately stabilized and discharged with continued Augmentin and doxycycline course for 6 weeks total through 12/19/2022.  Assessment & Plan:   Principal Problem:   Acute upper GI bleeding Active Problems:   Hx of CABG   Atherosclerotic heart disease   Tobacco use   Hypertension associated with diabetes (HCC)   Hyperlipidemia associated with type 2 diabetes mellitus (HCC)   Diabetes mellitus, type 2 (HCC)   COPD (chronic obstructive pulmonary disease) (HCC)   Chronic systolic CHF (congestive heart failure) (HCC)   Melena   Dark stools   PUD (peptic ulcer disease)   Symptomatic anemia   Symptomatic anemia due to upper GI bleed  - EGD on 6/16 by Dr Elnoria Howard at previous hospitalization showing non bleeding gastric and duodenal ulcers.  -EGD 11/24/2022 shows 1 nonbleeding duodenal ulcer with visible vessel status post epinephrine injection and hemostatic clips(see GI Op note for full details). -IV PPI through 7/724 transitioning to p.o - pantoprazole; continue Carafate x 4 weeks -Plavix/aspirin to resume as below Continue with Carafate for 1 month.  Full liquid diet -will advance as tolerated Oral  iron supplementation to be restarted in one week.  Hemoglobin stable -no further signs or symptoms of bleed awaiting BM to ensure resolution of GI bleed.   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 Wound cultures grew MRSA and Enterococcus faecalis. On 6-week total antibiotics with Augmentin and doxycycline per ID recommendation.  Continue Augmentin and doxycycline through 7/30.    Chronic systolic CHF/ ICM: Euvolemic on admission.  EF 45-50% with no regional wall abnormalities, grade 1 diastolic dysfunction (echo 09/12/2022)   CAD s/p CABG  He denies any chest pain.    Insulin-dependent diabetes, uncontrolled with hyperglycemia -A1C 10.4 two months ago -Continue aggressive glucose control in the setting of above -Sliding scale insulin, hypoglycemic protocol. -Lengthy discussion on dietary and lifestyle changes  Hypertension:  Well controlled.   COPD, chronic, without acute exacerbation:  Stable.  Not currently on any inhalers or steroids per chart review   Hyperlipidemia;  Continue Zetia, reports statin allergy   Estimated body mass index is 25.41 kg/m as calculated from the following:   Height as of this encounter: 5\' 8"  (1.727 m).   Weight as of this encounter: 75.8 kg.     DVT prophylaxis: SCDs Start: 11/23/22 1954 Code Status:   Code Status: Full Code Family Communication: None present  Status is: Inpatient  Dispo: The  patient is from: Home              Anticipated d/c is to: To be determined              Anticipated d/c date is: 24-48 hours              Patient currently not medically stable for discharge  Consultants:   GI  Procedures:  EGD with epinephrine injection as above  Antimicrobials:  Augmentin/doxycycline per prior hospitalization  Subjective: No acute issues or events overnight - constipation ongoing - no BM over the last 72h - abdominal pain minimal  Objective: Vitals:   11/25/22 1709 11/25/22 1933 11/26/22 0010 11/26/22 0514  BP: (!) 148/65 111/73 125/71 130/69  Pulse: 89 89 73 69  Resp: 20 17 14 13   Temp: 97.8 F (36.6 C) 98.7 F (37.1 C) 97.9 F (36.6 C) 97.6 F (36.4 C)  TempSrc: Oral Oral Oral Oral  SpO2: 100% 99% 99% 99%  Weight:    75.8 kg  Height:        Intake/Output Summary (Last 24 hours) at 11/26/2022 0739 Last data filed at 11/26/2022 0025 Gross per 24 hour  Intake 600 ml  Output --  Net 600 ml    Filed Weights   11/24/22 0516 11/25/22 0624 11/26/22 0514  Weight: 74 kg 73.7 kg 75.8 kg    Examination:  General:  Pleasantly resting in bed, No acute distress. HEENT:  Normocephalic atraumatic.  Sclerae nonicteric, noninjected.  Extraocular movements intact bilaterally. Neck:  Without mass or deformity.  Trachea is midline. Lungs:  Clear to auscultate bilaterally without rhonchi, wheeze, or rales. Heart:  Regular rate and rhythm.  Without murmurs, rubs, or gallops. Abdomen:  Soft, nontender, nondistended.  Without guarding or rebound. Extremities: Without cyanosis, clubbing, edema, or obvious deformity. Skin:  Warm and dry, no erythema.  Data Reviewed: I have personally reviewed following labs and imaging studies  CBC: Recent Labs  Lab 11/23/22 1442 11/23/22 1730 11/24/22 0632  WBC 6.9 6.6 6.2  HGB 9.5* 7.8* 8.8*  HCT 29.7* 25.3* 27.7*  MCV 90.8 92.0 86.0  PLT 414* 326 307    Basic Metabolic Panel: Recent Labs  Lab 11/23/22 1442 11/24/22 0632  NA 140 138  K 4.6 4.7  CL 107 99  CO2 19* 25  GLUCOSE 150* 74  BUN 33* 23*  CREATININE 1.24 0.99  CALCIUM 9.6 9.8    GFR: Estimated Creatinine Clearance: 80.6 mL/min (by C-G formula based on  SCr of 0.99 mg/dL). Liver Function Tests: Recent Labs  Lab 11/23/22 1442  AST 15  ALT 11  ALKPHOS 110  BILITOT 0.4  PROT 7.4  ALBUMIN 3.1*    CBG: Recent Labs  Lab 11/25/22 0622 11/25/22 1309 11/25/22 1707 11/25/22 2114 11/26/22 0622  GLUCAP 159* 215* 204* 186* 126*     No results found for this or any previous visit (from the past 240 hour(s)).   Radiology Studies: No results found.  Scheduled Meds:  amoxicillin-clavulanate  1 tablet Oral Q12H   aspirin EC  81 mg Oral Daily   doxycycline  100 mg Oral BID   ezetimibe  10 mg Oral Daily   gabapentin  600 mg Oral TID   gemfibrozil  600 mg Oral BID AC   insulin aspart  0-15 Units Subcutaneous TID WC   insulin aspart  0-5 Units Subcutaneous QHS   insulin glargine-yfgn  10 Units Subcutaneous QHS   melatonin  10 mg Oral QHS  nicotine  21 mg Transdermal Daily   pantoprazole (PROTONIX) IV  40 mg Intravenous Q12H   sodium chloride flush  3 mL Intravenous Q12H   Continuous Infusions:   LOS: 3 days   Time spent:  Azucena Fallen, DO [facility]  If 7PM-7AM, please contact night-coverage [email or web address]  11/26/2022, 7:39 AM

## 2022-11-27 ENCOUNTER — Other Ambulatory Visit (HOSPITAL_COMMUNITY): Payer: Self-pay

## 2022-11-27 DIAGNOSIS — K922 Gastrointestinal hemorrhage, unspecified: Secondary | ICD-10-CM | POA: Diagnosis not present

## 2022-11-27 LAB — GLUCOSE, CAPILLARY
Glucose-Capillary: 160 mg/dL — ABNORMAL HIGH (ref 70–99)
Glucose-Capillary: 237 mg/dL — ABNORMAL HIGH (ref 70–99)
Glucose-Capillary: 139 mg/dL — ABNORMAL HIGH (ref 70–99)

## 2022-11-27 LAB — CBC
HCT: 28.6 % — ABNORMAL LOW (ref 39.0–52.0)
Hemoglobin: 9.4 g/dL — ABNORMAL LOW (ref 13.0–17.0)
MCH: 28.4 pg (ref 26.0–34.0)
MCHC: 32.9 g/dL (ref 30.0–36.0)
MCV: 86.4 fL (ref 80.0–100.0)
Platelets: 329 10*3/uL (ref 150–400)
RBC: 3.31 MIL/uL — ABNORMAL LOW (ref 4.22–5.81)
RDW: 14 % (ref 11.5–15.5)
WBC: 7.5 10*3/uL (ref 4.0–10.5)
nRBC: 0 % (ref 0.0–0.2)

## 2022-11-27 MED ORDER — FLEET ENEMA 7-19 GM/118ML RE ENEM
1.0000 | ENEMA | Freq: Once | RECTAL | Status: AC
  Administered 2022-11-27: 1 via RECTAL
  Filled 2022-11-27: qty 1

## 2022-11-27 MED ORDER — AMOXICILLIN-POT CLAVULANATE 875-125 MG PO TABS
1.0000 | ORAL_TABLET | Freq: Two times a day (BID) | ORAL | 0 refills | Status: AC
  Filled 2022-11-27: qty 44, 22d supply, fill #0

## 2022-11-27 MED ORDER — POLYETHYLENE GLYCOL 3350 17 G PO PACK
17.0000 g | PACK | ORAL | Status: DC
  Administered 2022-11-27 (×2): 17 g via ORAL
  Filled 2022-11-27 (×2): qty 1

## 2022-11-27 MED ORDER — SENNOSIDES-DOCUSATE SODIUM 8.6-50 MG PO TABS
1.0000 | ORAL_TABLET | Freq: Two times a day (BID) | ORAL | 0 refills | Status: DC
  Filled 2022-11-27: qty 30, 15d supply, fill #0

## 2022-11-27 NOTE — Progress Notes (Signed)
Kenneth Bailey to be D/C'd Home per MD order.  Discussed with the patient and all questions fully answered.  VSS, Skin clean, dry and intact without evidence of skin break down, no evidence of skin tears noted. IV catheter discontinued intact. Site without signs and symptoms of complications. Dressing and pressure applied.  An After Visit Summary was printed and given to the patient. Patient received prescription.  D/c education completed with patient/family including follow up instructions, medication list, d/c activities limitations if indicated, with other d/c instructions as indicated by MD - patient able to verbalize understanding, all questions fully answered.   Patient instructed to return to ED, call 911, or call MD for any changes in condition.   Patient escorted via WC, and D/C home via private auto.  Selena Batten Britten Parady 11/27/2022 6:00 PM

## 2022-11-27 NOTE — Discharge Summary (Signed)
Physician Discharge Summary  Kenneth Bailey BJY:[id number] DOB: [birth year] DOA: 11/23/2022  PCP: [name], MD  Admit date: 11/23/2022 Discharge date: 11/27/2022  Admitted From: Home Disposition: Home  Recommendations for Outpatient Follow-up:  Follow up with PCP in 1-2 weeks   Home Health: None Equipment/Devices: None  Discharge Condition: Stable CODE STATUS:Full    Brief/Interim Summary: Kenneth Bailey is a 57 y.o. male with medical history significant for CAD s/p CABG, chronic systolic CHF (EF improved to 45-50%), COPD, T2DM, HTN, HLD, GI bleed due to gastric and duodenal ulcers, PAD, osteomyelitis s/p right fifth amputation who is admitted with symptomatic anemia due to recurrent upper GI bleed.   Patient with recent prolonged hospitalization 10/31/2022-11/17/2022 admitted for diabetic foot ulcer on the left foot with associated osteo of the left fifth toe status post resection -hospitalization complicated by upper GI bleed with EGD on 6/16 showing nonbleeding gastric and duodenal ulcers, multiple, requiring multiple units of transfusion.  Patient ultimately stabilized and discharged with continued Augmentin and doxycycline course for 6 weeks total through 12/19/2022.  Patient admitted as above with acute symptomatic anemia due to upper GI bleed, this is a recurrent episode.  Recently had upper GI series on 616 with Dr. Elnoria Howard as well as [phone] close showing nonbleeding gastric and duodenal ulcers.  There was a visible vessel noted on most recent EGD requiring epinephrine injection and hemostatic clips.  Patient's hemoglobin remained stable, continue pantoprazole and Carafate per GI.  Okay to resume Plavix and aspirin on 11/27/2022.  Otherwise follow-up outpatient with PCP and GI for further evaluation and treatment as appropriate.  Discharge Diagnoses:  Principal Problem:   Acute upper GI bleeding Active Problems:   Hx of CABG   Atherosclerotic heart disease   Tobacco use    Hypertension associated with diabetes (HCC)   Hyperlipidemia associated with type 2 diabetes mellitus (HCC)   Diabetes mellitus, type 2 (HCC)   COPD (chronic obstructive pulmonary disease) (HCC)   Chronic systolic CHF (congestive heart failure) (HCC)   Melena   Dark stools   PUD (peptic ulcer disease)   Symptomatic anemia    Discharge Instructions   Allergies as of 11/27/2022       Reactions   Lisinopril    Leg swelling   Statins    Leg swelling        Medication List     TAKE these medications    acetaminophen 325 MG tablet Commonly known as: TYLENOL Take 2 tablets (650 mg total) by mouth every 6 (six) hours as needed for mild pain, fever or headache.   amoxicillin-clavulanate 875-125 MG tablet Commonly known as: AUGMENTIN Take 1 tablet by mouth every 12 (twelve) hours for 22 days.   aspirin EC 81 MG tablet Take 1 tablet (81 mg total) by mouth daily. Swallow whole.   BASAGLAR KWIKPEN [place] Inject 2-50 Units into the skin 2 (two) times daily.   clopidogrel 75 MG tablet Commonly known as: PLAVIX Take 1 tablet (75 mg total) by mouth daily.   dapagliflozin propanediol 10 MG Tabs tablet Commonly known as: Farxiga Take 1 tablet (10 mg total) by mouth daily before breakfast.   diclofenac 75 MG EC tablet Commonly known as: VOLTAREN Take 75 mg by mouth 2 (two) times daily.   doxycycline 100 MG capsule Commonly known as: VIBRAMYCIN Take 1 capsule (100 mg total) by mouth 2 (two) times daily.   ezetimibe 10 MG tablet Commonly known as: ZETIA Take 1 tablet (10 mg total) by  mouth daily.   gabapentin 600 MG tablet Commonly known as: NEURONTIN Take 600 mg by mouth 3 (three) times daily.   gemfibrozil 600 MG tablet Commonly known as: LOPID Take 600 mg by mouth 2 (two) times daily before a meal.   Melatonin 10 MG Tabs Take 10 mg by mouth at bedtime.   metFORMIN 1000 MG tablet Commonly known as: GLUCOPHAGE Take 1,000 mg by mouth 2 (two) times daily.   nicotine  14 mg/24hr patch Commonly known as: NICODERM CQ - dosed in mg/24 hours Place 1 patch (14 mg total) onto the skin daily.   nitroGLYCERIN 0.4 MG SL tablet Commonly known as: NITROSTAT Place 1 tablet (0.4 mg total) under the tongue every 5 (five) minutes as needed for chest pain.   NovoLOG 100 UNIT/ML injection Generic drug: insulin aspart Inject 0-9 Units into the skin 3 (three) times daily with meals. CBG < 70: Implement Hypoglycemia Standing Orders CBG 70 - 120: 0 units CBG 121 - 150: 1 unit CBG 151 - 200: 2 units CBG 201 - 250: 3 units CBG 251 - 300: 5 units CBG 301 - 350: 7 units CBG 351 - 400: 9 units CBG > 400: call MD and obtain STAT lab verification   oxyCODONE 5 MG immediate release tablet Commonly known as: Oxy IR/ROXICODONE Take 5 mg by mouth every 6 (six) hours as needed for breakthrough pain or moderate pain.   pantoprazole 40 MG tablet Commonly known as: Protonix Take 1 tablet (40 mg total) by mouth 2 (two) times daily.   polyethylene glycol 17 g packet Commonly known as: MIRALAX / GLYCOLAX Take 17 g by mouth daily as needed for mild constipation.   Senexon-S 8.6-50 MG tablet Generic drug: senna-docusate Take 1 tablet by mouth 2 (two) times daily.   UltiCare Insulin Syringe 30G X 1/2" 0.3 ML Misc Generic drug: Insulin Syringe-Needle U-100 Use with Novolog        Allergies  Allergen Reactions   Lisinopril     Leg swelling   Statins     Leg swelling    Consultations: GI   Procedures/Studies: DG Foot Complete Left  Result Date: 11/17/2022 Please see detailed radiograph report in office note.  DG Foot Complete Left  Result Date: 11/16/2022 CLINICAL DATA:  Status post amputation. EXAM: LEFT FOOT - COMPLETE 3+ VIEW COMPARISON:  Radiographs 11/08/2022 FINDINGS: Surgical changes from partial fifth metatarsal amputation. No complicating features are identified. IMPRESSION: Surgical changes from partial fifth metatarsal amputation. Electronically Signed   By:  Rudie Meyer M.D.   On: 11/16/2022 11:22   MR FOOT LEFT W WO CONTRAST  Result Date: 11/13/2022 CLINICAL DATA:  Foot swelling, diabetic, osteomyelitis suspected, xray done EXAM: MRI OF THE LEFT FOREFOOT WITHOUT AND WITH CONTRAST TECHNIQUE: Multiplanar, multisequence MR imaging of the left forefoot was performed both before and after administration of intravenous contrast. CONTRAST:  7mL GADAVIST GADOBUTROL 1 MMOL/ML IV SOLN COMPARISON:  X-ray 11/08/2022.  MRI 10/30/2022 FINDINGS: Bones/Joint/Cartilage Postsurgical changes following resection of the distal aspect of the fifth metatarsal to the level of the proximal diaphysis. There is bone marrow edema and enhancement with intermediate to low T1 signal within the residual fifth metatarsal extending to the level of the proximal metaphysis. Mild bone marrow edema extends to the base of the fifth metatarsal (series 6, images 8-9). Bone marrow edema is present throughout the proximal phalanx of the fifth toe without associated low T1 bone marrow signal. Remaining bony structures of the forefoot demonstrate preserved bone marrow  signal without fracture, dislocation, or marrow replacement. Similar mild degenerative changes. Ligaments Intact Lisfranc ligament.  No collateral ligament injury. Muscles and Tendons Diffuse edema-like signal of the foot musculature which may represent changes related to denervation and/or myositis. No tenosynovitis. Soft tissues Soft tissue wound or ulceration at the lateral forefoot at the surgical site. Partially rim enhancing fluid and air collection within the lateral forefoot measuring approximately 6.2 x 1.2 x 3.0 cm. IMPRESSION: 1. 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 Acute osteomyelitis within the residual fifth metatarsal, as described. 2. Bone marrow edema is present throughout the proximal phalanx of the fifth toe is favored to represent early acute osteomyelitis. 3. Soft tissue wound or  ulceration at the lateral forefoot at the surgical site. Partially rim enhancing fluid and air collection within the lateral forefoot measuring approximately 6.2 x 1.2 x 3.0 cm. Findings are most compatible with phlegmon/developing abscess. Electronically Signed   By: Duanne Guess D.O.   On: 11/13/2022 11:11   DG Foot 2 Views Left  Result Date: 11/08/2022 CLINICAL DATA:  Recent I and D, initial encounter EXAM: LEFT FOOT - 2 VIEW COMPARISON:  11/07/2022 FINDINGS: Interval surgical excision of the fifth metatarsal is seen. No erosive changes to suggest osteomyelitis are noted. The previously seen soft tissue wound has been excised. Degenerative changes of the tarsal bones are noted. Calcaneal spurring is seen. IMPRESSION: Postsurgical changes without acute bony abnormality. Electronically Signed   By: Alcide Clever M.D.   On: 11/08/2022 20:01   DG Foot Complete Left  Result Date: 11/07/2022 CLINICAL DATA:  Osteomyelitis of foot EXAM: LEFT FOOT - COMPLETE 3+ VIEW COMPARISON:  10/31/2022, 10/30/2022 FINDINGS: Resection of distal fifth metatarsal. Stable alignment fifth digit. Cut margin remains smooth. No osseous destructive change. Increased air within the soft tissues at the surgical site. IMPRESSION: Status post resection of distal fifth metatarsal. Increased air collection within the soft tissues at the surgical site. Electronically Signed   By: Jasmine Pang M.D.   On: 11/07/2022 21:07   DG Chest Port 1 View  Result Date: 11/04/2022 CLINICAL DATA:  Sepsis EXAM: PORTABLE CHEST 1 VIEW COMPARISON:  04/05/2022 FINDINGS: The heart size and mediastinal contours are within normal limits. Prior sternotomy and CABG. Both lungs are clear. The visualized skeletal structures are unremarkable. IMPRESSION: No active disease. Electronically Signed   By: Duanne Guess D.O.   On: 11/04/2022 14:57   PERIPHERAL VASCULAR CATHETERIZATION  Result Date: 11/03/2022 Images from the original result were not included.    Patient name: Kenneth Bailey  MRN: [id number]        DOB: April 16, 1966            Sex: male  11/03/2022 Pre-operative Diagnosis: Critical limb ischemia of the left lower extremity with tissue loss Post-operative diagnosis:  Same Surgeon:  Cephus Shelling, MD Procedure Performed: 1.  Ultrasound-guided access right common femoral artery 2.  Aortogram with catheter selection of aorta 3.  Left lower extremity arteriogram with selection of third order branches including the left below-knee popliteal artery 4.  Ultrasound-guided access left posterior tibial artery at the ankle retrograde with pedal sheath placement 5.  Left posterior tibial artery and tibioperoneal trunk angioplasty (3 mm x 220 mm Sterling) 6.  Mynx closure of the right common femoral artery 7.  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  He presents  today for an angiogram with a focus on the left leg with possible intervention after risk benefits discussed.  Findings:  Ultrasound-guided access right common femoral artery.  Aortogram showed both renals were patent as well as his infrarenal aorta and both iliac arteries were patent.  On the left he had a patent common femoral and profunda.  The SFA has some diffuse disease without flow-limiting stenosis and is patent.  The above and below-knee popliteal arteries patent.  Tibial trifurcation is occluded.  Dominant runoff was through a diseased peroneal that reconstituted distally and was occluded proximally with a collateral filling the posterior tibial at the ankle.  Initially tried to go antegrade and I could not get through his chronic total occlusion in the tibial trifurcation focusing on the peroneal.  I then elected to stick posterior tibial retrograde at the ankle.  Then I was able to get through his chronic total occlusion in the posterior tibial retrograde and  snare my wire for through and through access.  The entire tibioperoneal trunk and posterior tibial artery was then angioplastied with a 3 mm x 220 mm Sterling.  I did go down and angioplastied the PT at the ankle with a 2 mm Sterling antegrade at completion.  Widely patent vessel at completion.  He now has inline flow through the posterior tibial artery.             Procedure:  The patient was identified in the holding area and taken to room 8.  The patient was then placed supine on the table and prepped and draped in the usual sterile fashion.  A time out was called.  The patient received Versed and fentanyl for conscious moderate sedation.  Vital signs were monitored including heart rate, respiratory rate, oxygenation and blood pressure.  I was present for all of moderate sedation.  Ultrasound was used to evaluate the right common femoral artery.  It was patent .  A digital ultrasound image was acquired.  A micropuncture needle was used to access the right common femoral artery under ultrasound guidance.  An 018 wire was advanced without resistance and a micropuncture sheath was placed.  The 018 wire was removed and a benson wire was placed.  The micropuncture sheath was exchanged for a 5 french sheath.  An omniflush catheter was advanced over the wire to the level of L-1.  An abdominal angiogram was obtained.  Next, using the omniflush catheter and a benson wire, the aortic bifurcation was crossed and the catheter was placed into theleft external iliac artery and left runoff was obtained.  Pertinent findings are noted above.  We elected to try antegrade intervention on his tibial disease.  I used a Glidewire advantage down the left SFA and exchanged for a long 5 French sheath in the right groin with a catapault sheath over the aortic bifurcation into the left SFA.  Patient was given 100 units/kg IV heparin.  I then went down with a V18 wire and a quick cross catheter and got into the below-knee popliteal artery.   I did hand injections here to identify the reconstitution of the peroneal distally and triued to get down the tibioperoneal trunk.  I could not get antegrade.  Eventually I did not think an antegrade attempt was going to be successful.  I then evaluated the posterior tibial at the ankle where it reconstituted and this was accessed under ultrasound guidance with a micro access needle placed a microsheath and then used to CXI catheter with a  V18 to come retrograde and got through the long segment posterior tibial occlusion.  I did snare my wire from the sheath in the right groin over the aortic bifurcation as through and through access.  The entire TP trunk and posterior tibial artery was then angioplastied with a long 3 mm x 220 mm Sterling for 2 minutes from an antegrade approach.  Final imaging showed excellent flow down to the ankle where there appeared to be residual stenosis.  I then pulled the pedal sheath and got my wire down into the plantar arch and then I angioplastied across the posterior tibial at the ankle with a 2 mm Sterling.  We gave nitro.  We then had widely patent vessel with inline flow down the foot.  Wires and catheters removed.  Mynx closure in the right groin.  Plan: Patient now has inline flow down the left lower extremity through the posterior tibial after retrograde access.  Optimized from vascular surgery standpoint.  Aspirin Plavix.  Statin allergy.    Cephus Shelling, MD [facility] Office: [phone]   DG Foot Complete Left  Result Date: 10/31/2022 CLINICAL DATA:  Osteomyelitis of left foot. EXAM: LEFT FOOT - COMPLETE 3+ VIEW COMPARISON:  Radiograph yesterday FINDINGS: Interval resection of the distal fifth metatarsal. The resection margin is smooth. The fifth toe remains in place. Expected postsurgical change in the soft tissues. The exam is otherwise unchanged. IMPRESSION: Interval resection of the distal fifth metatarsal. Electronically  Signed   By: Narda Rutherford M.D.   On: 10/31/2022 18:38   VAS [place] ABI WITH/WO TBI  Result Date: 10/31/2022  LOWER EXTREMITY DOPPLER STUDY Patient Name:  Kenneth Bailey  Date of Exam:   10/31/2022 Medical Rec #: [id number]            Accession #:    [id number] Date of Birth: Dec 12, 1965             Patient Gender: M Patient Age:   34 years Exam Location:  [facility] Procedure:      VAS [place] ABI WITH/WO TBI Referring Phys: REBECCA SIKORA --------------------------------------------------------------------------------  Indications: Ulceration, and peripheral artery disease. High Risk Factors: Hypertension, hyperlipidemia, Diabetes, current smoker.  Comparison Study: Previous 06/07/21 abnormal. Performing Technologist: McKayla Maag RVT, [place]  Examination Guidelines: A complete evaluation includes at minimum, Doppler waveform signals and systolic blood pressure reading at the level of bilateral brachial, anterior tibial, and posterior tibial arteries, when vessel segments are accessible. Bilateral testing is considered an integral part of a complete examination. Photoelectric Plethysmograph (PPG) waveforms and toe systolic pressure readings are included as required and additional duplex testing as needed. Limited examinations for reoccurring indications may be performed as noted.  ABI Findings: +---------+------------------+-----+----------+--------+ Right    Rt Pressure (mmHg)IndexWaveform  Comment  +---------+------------------+-----+----------+--------+ Brachial 152                    biphasic           +---------+------------------+-----+----------+--------+ PTA      91                0.60 monophasic         +---------+------------------+-----+----------+--------+ DP       96                0.63 monophasic         +---------+------------------+-----+----------+--------+ Great Toe39                0.26  Abnormal           +---------+------------------+-----+----------+--------+  +---------+------------------+-----+-------------------+-----------------------+ Left     Lt Pressure (mmHg)IndexWaveform           Comment                 +---------+------------------+-----+-------------------+-----------------------+ Brachial                        biphasic           No pressure obtained                                                       due to IV placement     +---------+------------------+-----+-------------------+-----------------------+ PTA      91                0.60 monophasic                                 +---------+------------------+-----+-------------------+-----------------------+ DP       74                0.49 dampened monophasic                        +---------+------------------+-----+-------------------+-----------------------+ Great Toe                       Absent                                     +---------+------------------+-----+-------------------+-----------------------+ +-------+-----------+-----------+------------+------------+ ABI/TBIToday's ABIToday's TBIPrevious ABIPrevious TBI +-------+-----------+-----------+------------+------------+ Right  0.63       0.26       0.59        0.22         +-------+-----------+-----------+------------+------------+ Left   0.60       Absent     0.74        0.38         +-------+-----------+-----------+------------+------------+ Right ABIs and TBIs appear essentially unchanged. Left ABIs and TBIs appear decreased.  Summary: Right: Resting right ankle-brachial index indicates moderate right lower extremity arterial disease. The right toe-brachial index is abnormal. Left: Resting left ankle-brachial index indicates moderate left lower extremity arterial disease. *See table(s) above for measurements and observations.  Electronically signed by Coral Else MD on 10/31/2022 at 5:48:53 PM.    Final    MR FOOT LEFT W WO CONTRAST  Result Date: 10/31/2022 CLINICAL DATA:  Left foot  infection. EXAM: MRI OF THE LEFT FOREFOOT WITHOUT AND WITH CONTRAST TECHNIQUE: Multiplanar, multisequence MR imaging of the left forefoot was performed both before and after administration of intravenous contrast. CONTRAST:  7mL GADAVIST GADOBUTROL 1 MMOL/ML IV SOLN COMPARISON:  Left foot x-rays from same day. FINDINGS: Bones/Joint/Cartilage Mild marrow edema and enhancement involving the fifth metatarsal head, early loss of the normal T1 marrow signal, consistent with osteomyelitis. No fracture or dislocation. Mild osteoarthritis of the first MTP joint. No joint effusion. Ligaments Collateral ligaments are intact. Muscles and Tendons Flexor and extensor tendons are intact. No tenosynovitis. Soft tissue Superficial ulceration at the lateral plantar aspect of the fifth metatarsal head with underlying lobulated T2 hyperintense,  T1 isointense signal without rim enhancement, measuring approximately 4.7 x 0.7 x 2.1 cm. No soft tissue mass. IMPRESSION: 1. Superficial ulceration at the lateral plantar aspect of the fifth metatarsal head with underlying early osteomyelitis of the fifth metatarsal head and 4.7 x 0.7 x 2.1 cm phlegmon/early abscess. Electronically Signed   By: Obie Dredge M.D.   On: 10/31/2022 09:55     Subjective: No acute issues/events overnight -BM this am non-bloody   Discharge Exam: Vitals:   11/27/22 1230 11/27/22 1606  BP: 138/70 (!) 144/69  Pulse: 75 80  Resp: 16 18  Temp: 98.2 F (36.8 C) 98.1 F (36.7 C)  SpO2: 100% 100%   Vitals:   11/27/22 0621 11/27/22 0941 11/27/22 1230 11/27/22 1606  BP:  122/71 138/70 (!) 144/69  Pulse:  68 75 80  Resp:  18 16 18   Temp:  97.7 F (36.5 C) 98.2 F (36.8 C) 98.1 F (36.7 C)  TempSrc:  Oral Oral Oral  SpO2:  99% 100% 100%  Weight: 77.4 kg     Height:        General: Pt is alert, awake, not in acute distress Cardiovascular: RRR, S1/S2 +, no rubs, no gallops Respiratory: CTA bilaterally, no wheezing, no rhonchi Abdominal: Soft,  NT, ND, bowel sounds + Extremities: no edema, no cyanosis    The results of significant diagnostics from this hospitalization (including imaging, microbiology, ancillary and laboratory) are listed below for reference.     Microbiology: No results found for this or any previous visit (from the past 240 hour(s)).   Labs: BNP (last 3 results) Recent Labs    02/27/22 0904  BNP 72.1   Basic Metabolic Panel: Recent Labs  Lab 11/23/22 1442 11/24/22 0632  NA 140 138  K 4.6 4.7  CL 107 99  CO2 19* 25  GLUCOSE 150* 74  BUN 33* 23*  CREATININE 1.24 0.99  CALCIUM 9.6 9.8   Liver Function Tests: Recent Labs  Lab 11/23/22 1442  AST 15  ALT 11  ALKPHOS 110  BILITOT 0.4  PROT 7.4  ALBUMIN 3.1*   No results for input(s): "LIPASE", "AMYLASE" in the last 168 hours. No results for input(s): "AMMONIA" in the last 168 hours. CBC: Recent Labs  Lab 11/23/22 1442 11/23/22 1730 11/24/22 0632 11/26/22 1142 11/27/22 0124  WBC 6.9 6.6 6.2 7.4 7.5  HGB 9.5* 7.8* 8.8* 9.3* 9.4*  HCT 29.7* 25.3* 27.7* 28.9* 28.6*  MCV 90.8 92.0 86.0 86.8 86.4  PLT 414* 326 307 379 329   Cardiac Enzymes: No results for input(s): "CKTOTAL", "CKMB", "CKMBINDEX", "TROPONINI" in the last 168 hours. BNP: Invalid input(s): "POCBNP" CBG: Recent Labs  Lab 11/26/22 1316 11/26/22 1801 11/27/22 0620 11/27/22 1243 11/27/22 1602  GLUCAP 194* 162* 160* 237* 139*   D-Dimer No results for input(s): "DDIMER" in the last 72 hours. Hgb A1c No results for input(s): "HGBA1C" in the last 72 hours. Lipid Profile No results for input(s): "CHOL", "HDL", "LDLCALC", "TRIG", "CHOLHDL", "LDLDIRECT" in the last 72 hours. Thyroid function studies No results for input(s): "TSH", "T4TOTAL", "T3FREE", "THYROIDAB" in the last 72 hours.  Invalid input(s): "FREET3" Anemia work up No results for input(s): "VITAMINB12", "FOLATE", "FERRITIN", "TIBC", "IRON", "RETICCTPCT" in the last 72 hours. Urinalysis    Component  Value Date/Time   COLORURINE YELLOW 06/10/2021 0012   APPEARANCEUR CLEAR 06/10/2021 0012   LABSPEC 1.017 06/10/2021 0012   PHURINE 5.0 06/10/2021 0012   GLUCOSEU >=500 (A) 06/10/2021 0012   HGBUR NEGATIVE 06/10/2021 0012  BILIRUBINUR NEGATIVE 06/10/2021 0012   KETONESUR NEGATIVE 06/10/2021 0012   PROTEINUR 30 (A) 06/10/2021 0012   NITRITE NEGATIVE 06/10/2021 0012   LEUKOCYTESUR NEGATIVE 06/10/2021 0012   Sepsis Labs Recent Labs  Lab 11/23/22 1730 11/24/22 0632 11/26/22 1142 11/27/22 0124  WBC 6.6 6.2 7.4 7.5   Microbiology No results found for this or any previous visit (from the past 240 hour(s)).   Time coordinating discharge: Over 30 minutes  SIGNED:   Azucena Fallen, DO [facility] 11/27/2022, 7:24 PM Pager   If 7PM-7AM, please contact night-coverage [email or web address]

## 2022-11-27 NOTE — Anesthesia Postprocedure Evaluation (Signed)
Anesthesia Post Note  Patient: Kenneth Bailey  Procedure(s) Performed: ESOPHAGOGASTRODUODENOSCOPY (EGD) HOT HEMOSTASIS (ARGON PLASMA COAGULATION/BICAP) HEMOSTASIS CONTROL HEMOSTASIS CLIP PLACEMENT     Patient location during evaluation: PACU Anesthesia Type: MAC Level of consciousness: awake and alert Pain management: pain level controlled Vital Signs Assessment: post-procedure vital signs reviewed and stable Respiratory status: spontaneous breathing, nonlabored ventilation, respiratory function stable and patient connected to nasal cannula oxygen Cardiovascular status: stable and blood pressure returned to baseline Postop Assessment: no apparent nausea or vomiting Anesthetic complications: no   No notable events documented.              Shelton Silvas

## 2022-11-28 ENCOUNTER — Encounter (HOSPITAL_COMMUNITY): Payer: Self-pay | Admitting: Gastroenterology

## 2022-12-04 ENCOUNTER — Ambulatory Visit (INDEPENDENT_AMBULATORY_CARE_PROVIDER_SITE_OTHER): Payer: BC Managed Care – PPO | Admitting: Podiatry

## 2022-12-04 DIAGNOSIS — M86672 Other chronic osteomyelitis, left ankle and foot: Secondary | ICD-10-CM

## 2022-12-04 DIAGNOSIS — Z9889 Other specified postprocedural states: Secondary | ICD-10-CM

## 2022-12-04 NOTE — Progress Notes (Signed)
  Subjective:  Patient ID: Kenneth Bailey, male    DOB: 10/14/1965,  MRN: [id number]  Chief Complaint  Patient presents with   Routine Post Op    DOS 11/15/2022 POV#3 Amputation Fith Ray Left. Staples are intact. No sign of infection at this time.    DOS: 11/15/2022 Procedure: Partial fifth ray amputation left foot initially performed by Dr. Ralene Cork and then completed by Dr. Loreta Ave and revision surgery completed by Dr. Logan Bores with total amputation of the fifth ray.  57 y.o. male returns for post-op check.  He is now approaching 3 weeks postop on his fifth ray amputation closure.  Reports he is doing well denies any drainage from the area.  He has home health care coming out to do dressing changes 2 times weekly.  Review of Systems: Negative except as noted in the HPI. Denies N/V/F/Ch.   Objective:  There were no vitals filed for this visit. There is no height or weight on file to calculate BMI. Constitutional Well developed. Well nourished.  Vascular Foot warm and well perfused. Capillary refill normal to all digits.  Calf is soft and supple, no posterior calf or knee pain, negative Homans' sign  Neurologic Normal speech. Oriented to person, place, and time. Epicritic sensation to light touch grossly present bilaterally.  Dermatologic Skin healing well without signs of infection. Skin edges well coapted without signs of infection.  Improved healing from prior.  There is a very small central area of dehiscence that is superficial in nature but is less than 1/2 cm   Orthopedic: Tenderness to palpation noted about the surgical site.   Multiple view plain film radiographs: Deferred at this visit Assessment:   1. 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 Post-operative state     Plan:  Patient was evaluated and treated and all questions answered.  S/p foot surgery left -Progressing as expected post-operatively. -XR: Deferred -WB Status: Limited  weightbearing as tolerated to heal in postop shoe -Sutures: Staples removed in total at this visit the -Medications: Continue p.o. antibiotics until gone -Foot redressed, Steri-Strips applied.  Recond Ace wrap for protection over the amputation site  No follow-ups on file.         [name], DPM [facility] / [facility]

## 2022-12-08 ENCOUNTER — Ambulatory Visit (HOSPITAL_COMMUNITY): Payer: BC Managed Care – PPO

## 2022-12-09 NOTE — Progress Notes (Unsigned)
HISTORY AND PHYSICAL     CC:  follow up. Requesting Provider:  [name], MD  HPI: This is a 57 y.o. male who is here today for follow up for PAD.  Pt has hx of angiogram with left PTA and TPT angioplasty on 11/03/2022 by Dr. Chestine Spore for CLI LLE with tissue loss.  After intervention, he had inline flow to the left foot via the PTA with a faintly palpable PT pulse distal to the access site.  He subsequently underwent I&D of his foot on 11/08/2022 by Dr. Ardelle Anton and then partial left foot amputation on 11/15/2022 by Dr. Logan Bores.   He was continued on asa and plavix and wound care per podiatry.  He was scheduled for f/u in 4-6 weeks with arterial studies.   He was seen by podiatry on 12/04/2022 and he ws progressing as expected post operatively.    The pt returns today for follow up.  He states that he saw the ID doctor this morning.  He states his foot is getting better. He denies any pain in the right foot.  He does not have claudication.  He continues to smoke but is down to about 5 cigarettes per day.  He states prior to his heart surgery, he was smoking about 3 ppd.  He states he is trying to get his blood glucose under better control and that it is running around 178. He states he doesn't walk as much due to imbalance.  He has applied for disability.  He does have some leg swelling.  He has hx of endoscopic vein harvest from both legs.   He did see ID this morning and they are going to continue Doxycycline and Augmentin x 6 weeks from the OR EOT 7/30   The pt is not on a statin for cholesterol management.   allergy The pt is on an aspirin.    Other AC:  Plavix The pt is not on medication for hypertension.  The pt is  on medication for diabetes. Tobacco hx:  current   Past Medical History:  Diagnosis Date   COPD (chronic obstructive pulmonary disease) (HCC)    Diabetes mellitus, type 2 (HCC)    HLD (hyperlipidemia)    Hypertension    Tobacco abuse     Past Surgical History:   Procedure Laterality Date   ABDOMINAL AORTOGRAM W/LOWER EXTREMITY N/A 11/03/2022   Procedure: ABDOMINAL AORTOGRAM W/LOWER EXTREMITY;  Surgeon: Cephus Shelling, MD;  [facility]: [facility];  Service: Cardiovascular;  Laterality: N/A;   AMPUTATION Left 10/31/2022   Procedure: AMPUTATION LEFT FIFTH TOE;  Surgeon: Louann Sjogren, DPM;  Location: [facility] OR;  Service: Podiatry;  Laterality: Left;   AMPUTATION Left 11/15/2022   Procedure: AMPUTATION FITH RAY;  Surgeon: Felecia Shelling, DPM;  Location: [facility] OR;  Service: Podiatry;  Laterality: Left;   AMPUTATION TOE Left 10/31/2022   Procedure: IRRIGATION AND DEBRIDEMENTOF LEFT FOOT;  Surgeon: Louann Sjogren, DPM;  Location: [facility] OR;  Service: Podiatry;  Laterality: Left;   BIOPSY  11/05/2022   Procedure: BIOPSY;  Surgeon: Jeani Hawking, MD;  Location: [facility] ENDOSCOPY;  Service: Gastroenterology;;   CORONARY ARTERY BYPASS GRAFT N/A 06/10/2021   Procedure: CORONARY ARTERY BYPASS GRAFTING (CABG) TIMES FOUR, USING LEFT INTERNAL MAMMARY ARTERY AND RIGHT GREATER SAPHENOUS VEIN HARVESTED ENDOSCOPICALLY;  Surgeon: Lovett Sox, MD;  Location: [facility] OR;  Service: Open Heart Surgery;  Laterality: N/A;   ENDOVEIN HARVEST OF GREATER SAPHENOUS VEIN Right 06/10/2021   Procedure: ENDOVEIN HARVEST OF  GREATER SAPHENOUS VEIN;  Surgeon: Lovett Sox, MD;  Location: [facility] OR;  Service: Open Heart Surgery;  Laterality: Right;   ESOPHAGOGASTRODUODENOSCOPY N/A 11/24/2022   Procedure: ESOPHAGOGASTRODUODENOSCOPY (EGD);  Surgeon: Lemar Lofty., MD;  Location: [facility] ENDOSCOPY;  Service: Gastroenterology;  Laterality: N/A;   ESOPHAGOGASTRODUODENOSCOPY (EGD) WITH PROPOFOL N/A 11/05/2022   Procedure: ESOPHAGOGASTRODUODENOSCOPY (EGD) WITH PROPOFOL;  Surgeon: Jeani Hawking, MD;  Location: [facility] ENDOSCOPY;  Service: Gastroenterology;  Laterality: N/A;   HEMOSTASIS CLIP PLACEMENT  11/24/2022   Procedure: HEMOSTASIS CLIP PLACEMENT;  Surgeon: Lemar Lofty., MD;  Location: [facility]  ENDOSCOPY;  Service: Gastroenterology;;   HEMOSTASIS CONTROL  11/24/2022   Procedure: HEMOSTASIS CONTROL;  Surgeon: Lemar Lofty., MD;  Location: [facility] ENDOSCOPY;  Service: Gastroenterology;;   HOT HEMOSTASIS N/A 11/24/2022   Procedure: HOT HEMOSTASIS (ARGON PLASMA COAGULATION/BICAP);  Surgeon: Lemar Lofty., MD;  Location: [facility] ENDOSCOPY;  Service: Gastroenterology;  Laterality: N/A;   I & D EXTREMITY Left 11/08/2022   Procedure: IRRIGATION AND DEBRIDEMENT OF FOOT AND WASHOUT;  Surgeon: Vivi Barrack, DPM;  Location: [facility] OR;  Service: Podiatry;  Laterality: Left;   PERIPHERAL VASCULAR BALLOON ANGIOPLASTY  11/03/2022   Procedure: PERIPHERAL VASCULAR BALLOON ANGIOPLASTY;  Surgeon: Cephus Shelling, MD;  [facility]: [facility];  Service: Cardiovascular;;   RIGHT/LEFT HEART CATH AND CORONARY ANGIOGRAPHY N/A 06/06/2021   Procedure: RIGHT/LEFT HEART CATH AND CORONARY ANGIOGRAPHY;  Surgeon: Dolores Patty, MD;  [facility]: [facility];  Service: Cardiovascular;  Laterality: N/A;   TEE WITHOUT CARDIOVERSION N/A 06/10/2021   Procedure: TRANSESOPHAGEAL ECHOCARDIOGRAM (TEE);  Surgeon: Lovett Sox, MD;  Location: [facility] OR;  Service: Open Heart Surgery;  Laterality: N/A;    Allergies  Allergen Reactions   Lisinopril     Leg swelling   Statins     Leg swelling    Current Outpatient Medications  Medication Sig Dispense Refill   acetaminophen (TYLENOL) 325 MG tablet Take 2 tablets (650 mg total) by mouth every 6 (six) hours as needed for mild pain, fever or headache. 20 tablet 0   amoxicillin-clavulanate (AUGMENTIN) 875-125 MG tablet Take 1 tablet by mouth every 12 (twelve) hours for 22 days. 44 tablet 0   aspirin EC 81 MG tablet Take 1 tablet (81 mg total) by mouth daily. Swallow whole. 30 tablet 6   clopidogrel (PLAVIX) 75 MG tablet Take 1 tablet (75 mg total) by mouth daily. 30 tablet 0   dapagliflozin propanediol (FARXIGA) 10 MG TABS tablet Take 1 tablet (10 mg  total) by mouth daily before breakfast. 30 tablet 6   diclofenac (VOLTAREN) 75 MG EC tablet Take 75 mg by mouth 2 (two) times daily.     doxycycline (VIBRAMYCIN) 100 MG capsule Take 1 capsule (100 mg total) by mouth 2 (two) times daily. 84 capsule 0   ezetimibe (ZETIA) 10 MG tablet Take 1 tablet (10 mg total) by mouth daily. 30 tablet 0   gabapentin (NEURONTIN) 600 MG tablet Take 600 mg by mouth 3 (three) times daily.     gemfibrozil (LOPID) 600 MG tablet Take 600 mg by mouth 2 (two) times daily before a meal.     insulin aspart (NOVOLOG) 100 UNIT/ML injection Inject 0-9 Units into the skin 3 (three) times daily with meals. CBG < 70: Implement Hypoglycemia Standing Orders CBG 70 - 120: 0 units CBG 121 - 150: 1 unit CBG 151 - 200: 2 units CBG 201 - 250: 3 units CBG 251 - 300: 5 units CBG 301 - 350:  7 units CBG 351 - 400: 9 units CBG > 400: call MD and obtain STAT lab verification 10 mL 0   Insulin Glargine (BASAGLAR KWIKPEN [place]) Inject 2-50 Units into the skin 2 (two) times daily.     Insulin Syringe-Needle U-100 30G X 1/2" 0.3 ML MISC Use with Novolog 90 each 0   Melatonin 10 MG TABS Take 10 mg by mouth at bedtime.     metFORMIN (GLUCOPHAGE) 1000 MG tablet Take 1,000 mg by mouth 2 (two) times daily.     nicotine (NICODERM CQ - DOSED IN MG/24 HOURS) 14 mg/24hr patch Place 1 patch (14 mg total) onto the skin daily. 28 patch 0   nitroGLYCERIN (NITROSTAT) 0.4 MG SL tablet Place 1 tablet (0.4 mg total) under the tongue every 5 (five) minutes as needed for chest pain. 90 tablet 3   oxyCODONE (OXY IR/ROXICODONE) 5 MG immediate release tablet Take 5 mg by mouth every 6 (six) hours as needed for breakthrough pain or moderate pain.     pantoprazole (PROTONIX) 40 MG tablet Take 1 tablet (40 mg total) by mouth 2 (two) times daily. 60 tablet 1   polyethylene glycol (MIRALAX / GLYCOLAX) 17 g packet Take 17 g by mouth daily as needed for mild constipation. 14 each 0   senna-docusate (SENOKOT-S) 8.6-50 MG tablet Take  1 tablet by mouth 2 (two) times daily. 30 tablet 0   No current facility-administered medications for this visit.    Family History  Problem Relation Age of Onset   Hypertension Mother    Cancer Mother    Heart disease Father     Social History   Socioeconomic History   Marital status: Divorced    Spouse name: Not on file   Number of children: Not on file   Years of education: Not on file   Highest education level: Not on file  Occupational History   Not on file  Tobacco Use   Smoking status: Every Day    Current packs/day: 0.22    Types: Cigarettes   Smokeless tobacco: Current  Vaping Use   Vaping status: Never Used  Substance and Sexual Activity   Alcohol use: Yes    Comment: occasionally   Drug use: Never   Sexual activity: Not on file  Other Topics Concern   Not on file  Social History Narrative   Not on file   Social Determinants of Health   Financial Resource Strain: Not at Risk (06/14/2022)   Received from [place], [employer]    Financial Resource Strain: 1  Food Insecurity: No Food Insecurity (11/24/2022)   Hunger Vital Sign    Worried About Running Out of Food in the Last Year: Never true    Ran Out of Food in the Last Year: Never true  Transportation Needs: No Transportation Needs (11/24/2022)   PRAPARE - Administrator, Civil Service (Medical): No    Lack of Transportation (Non-Medical): No  Physical Activity: Not at Risk (06/14/2022)   Received from [place], [place]   Physical Activity    Physical Activity: 1  Stress: Not at Risk (06/14/2022)   Received from [facility], [place]   Stress    Stress: 1  Social Connections: Not at Risk (06/14/2022)   Received from [place], [place]   Social Connections    Social Connections and Isolation: 1  Intimate Partner Violence: Not At Risk (11/24/2022)   Humiliation, Afraid, Rape, and Kick questionnaire    Fear of Current  or Ex-Partner: No    Emotionally Abused: No    Physically Abused: No     Sexually Abused: No     REVIEW OF SYSTEMS:   [X]  denotes positive finding, [ ]  denotes negative finding Cardiac  Comments:  Chest pain or chest pressure:    Shortness of breath upon exertion:    Short of breath when lying flat:    Irregular heart rhythm:        Vascular    Pain in calf, thigh, or hip brought on by ambulation:    Pain in feet at night that wakes you up from your sleep:     Blood clot in your veins:    Leg swelling:         Pulmonary    Oxygen at home:    Productive cough:     Wheezing:         Neurologic    Sudden weakness in arms or legs:     Sudden numbness in arms or legs:     Sudden onset of difficulty speaking or slurred speech:    Temporary loss of vision in one eye:     Problems with dizziness:         Gastrointestinal    Blood in stool:     Vomited blood:         Genitourinary    Burning when urinating:     Blood in urine:        Psychiatric    Major depression:         Hematologic    Bleeding problems:    Problems with blood clotting too easily:        Skin    Rashes or ulcers:        Constitutional    Fever or chills:      PHYSICAL EXAMINATION:  Today's Vitals   12/12/22 1132  BP: (!) 145/73  Pulse: (!) 107  Resp: 18  Temp: 98.1 F (36.7 C)  TempSrc: Temporal  SpO2: 97%  Weight: 173 lb 14.4 oz (78.9 kg)  Height: 5\' 8"  (1.727 m)  PainSc: 0-No pain   Body mass index is 26.44 kg/m.   General:  WDWN in NAD; vital signs documented above Gait: Not observed HENT: WNL, normocephalic Pulmonary: normal non-labored breathing , without wheezing Cardiac: regular HR, without carotid bruits Skin: without rashes Vascular Exam/Pulses: Brisk left PT doppler flow Brisk right peroneal doppler signal  Pictured from earlier today.  Extremities:incision is healing left foot Musculoskeletal: no muscle wasting or atrophy  Neurologic: A&O X 3 Psychiatric:  The pt has Normal affect.   Non-Invasive Vascular Imaging:    ABI's/TBI's on 12/11/2022: Right:  0.68/0.29 - Great toe pressure: 35 Left:  1.09/0.28 - Great toe pressure: 34  Arterial duplex on 12/11/2022: +-----------+--------+-----+--------+----------+--------+  LEFT      PSV cm/sRatioStenosisWaveform  Comments  +-----------+--------+-----+--------+----------+--------+  DFA       118                  triphasic           +-----------+--------+-----+--------+----------+--------+  SFA Prox   74                   triphasic           +-----------+--------+-----+--------+----------+--------+  SFA Mid    74                   triphasic           +-----------+--------+-----+--------+----------+--------+  SFA Distal 59                   triphasic           +-----------+--------+-----+--------+----------+--------+  POP Prox   57                   triphasic           +-----------+--------+-----+--------+----------+--------+  POP Mid    65                   triphasic           +-----------+--------+-----+--------+----------+--------+  POP Distal 47                   triphasic           +-----------+--------+-----+--------+----------+--------+  TP Trunk   35                   triphasic           +-----------+--------+-----+--------+----------+--------+  ATA Distal 0            occluded                    +-----------+--------+-----+--------+----------+--------+  PTA Prox   110                  triphasic           +-----------+--------+-----+--------+----------+--------+  PTA Distal 53                   monophasicbrisk     +-----------+--------+-----+--------+----------+--------+  PERO Prox  69                   triphasic           +-----------+--------+-----+--------+----------+--------+  PERO Distal65                   monophasicbrisk     +-----------+--------+-----+--------+----------+--------+   Summary:  Left: Total occlusion noted in the anterior  tibial artery. All other arteries in the Left lower extremity appear patent without evidence of  hemodynamically significant stenosis   Previous ABI's/TBI's on 10/31/2022: Right:  0.63/0.26 - Great toe pressure: 39 Left:  0.60/absent - Great toe pressure:  absent    ASSESSMENT/PLAN:: 57 y.o. male here for follow up for PAD with hx of  angiogram with left PTA and TPT angioplasty on 11/03/2022 by Dr. Chestine Spore for CLI LLE with tissue loss.  After intervention, he had inline flow to the left foot via the PTA with a faintly palpable PT pulse distal to the access site.  He subsequently underwent I&D of his foot on 11/08/2022 by Dr. Ardelle Anton and then partial left foot amputation on 11/15/2022 by Dr. Logan Bores. She was continued on asa and plavix and wound care per podiatry.  He was scheduled for f/u in 4-6 weeks with arterial studies.   PAD -brisk left PT doppler signal and ABI improved on the left.  His left foot incision is healing -continue asa/plavix.  He has allergy to statin. -pt will f/u in 6 months with LLE arterial duplex.  He will call sooner if he has any issues before then.  Current smoker -he is down to 5 cigarettes per day.  Discussed the importance of smoking cessation and this puts him at higher risk for limb loss, heart attack, stroke and cancers.  He will continue to work on this.    Doreatha Massed, [facility] [phone]  Clinic MD:   Chestine Spore

## 2022-12-11 ENCOUNTER — Ambulatory Visit (INDEPENDENT_AMBULATORY_CARE_PROVIDER_SITE_OTHER)
Admission: RE | Admit: 2022-12-11 | Discharge: 2022-12-11 | Disposition: A | Discharge status: Home / Self Care | Payer: BC Managed Care – PPO | Source: Ambulatory Visit | Attending: Vascular Surgery | Admitting: Vascular Surgery

## 2022-12-11 ENCOUNTER — Ambulatory Visit (HOSPITAL_COMMUNITY)
Admission: RE | Admit: 2022-12-11 | Discharge: 2022-12-11 | Disposition: A | Discharge status: Home / Self Care | Payer: BC Managed Care – PPO | Source: Ambulatory Visit | Attending: Vascular Surgery | Admitting: Vascular Surgery

## 2022-12-11 DIAGNOSIS — I739 Peripheral vascular disease, unspecified: Secondary | ICD-10-CM | POA: Diagnosis not present

## 2022-12-11 LAB — VAS US ABI WITH/WO TBI
Left ABI: 1.09
Right ABI: 0.68

## 2022-12-12 ENCOUNTER — Ambulatory Visit (INDEPENDENT_AMBULATORY_CARE_PROVIDER_SITE_OTHER): Payer: BC Managed Care – PPO | Admitting: Internal Medicine

## 2022-12-12 ENCOUNTER — Encounter: Payer: Self-pay | Admitting: Internal Medicine

## 2022-12-12 ENCOUNTER — Ambulatory Visit: Payer: BC Managed Care – PPO

## 2022-12-12 ENCOUNTER — Other Ambulatory Visit: Payer: Self-pay

## 2022-12-12 VITALS — BP 145/73 | HR 107 | Temp 98.1°F | Resp 18 | Ht 68.0 in | Wt 173.9 lb

## 2022-12-12 VITALS — BP 137/79 | HR 105 | Temp 98.5°F | Wt 173.0 lb

## 2022-12-12 DIAGNOSIS — L02612 Cutaneous abscess of left foot: Secondary | ICD-10-CM

## 2022-12-12 DIAGNOSIS — F17200 Nicotine dependence, unspecified, uncomplicated: Secondary | ICD-10-CM | POA: Diagnosis not present

## 2022-12-12 DIAGNOSIS — I739 Peripheral vascular disease, unspecified: Secondary | ICD-10-CM | POA: Diagnosis not present

## 2022-12-12 LAB — SEDIMENTATION RATE: Sed Rate: 31 mm/h — ABNORMAL HIGH (ref 0–20)

## 2022-12-12 LAB — CBC WITH DIFFERENTIAL/PLATELET
Absolute Monocytes: 499 cells/uL (ref 200–950)
Basophils Absolute: 69 cells/uL (ref 0–200)
HCT: 30.7 % — ABNORMAL LOW (ref 38.5–50.0)
Hemoglobin: 9.7 g/dL — ABNORMAL LOW (ref 13.2–17.1)
Lymphs Abs: 1772 cells/uL (ref 850–3900)
MCH: 27.2 pg (ref 27.0–33.0)
MCV: 86 fL (ref 80.0–100.0)
Monocytes Relative: 5.8 %
Total Lymphocyte: 20.6 %
WBC: 8.6 10*3/uL (ref 3.8–10.8)

## 2022-12-12 NOTE — Progress Notes (Signed)
1         Patient Active Problem List   Diagnosis Date Noted   Melena 11/25/2022   Dark stools 11/25/2022   PUD (peptic ulcer disease) 11/25/2022   Symptomatic anemia 11/25/2022   Acute upper GI bleeding 11/23/2022   Chronic systolic CHF (congestive heart failure) (HCC) 11/23/2022   Sepsis (HCC) 11/11/2022   Gastric ulcer 11/06/2022   Duodenal ulcer 11/06/2022   [place] grade D esophagitis 11/06/2022   Abscess of left foot 11/01/2022   Osteomyelitis (HCC) 10/30/2022   Tobacco use 09/29/2022   Hypertension associated with diabetes (HCC) 09/29/2022   Hyperlipidemia associated with type 2 diabetes mellitus (HCC) 09/29/2022   Diabetes mellitus, type 2 (HCC) 09/29/2022   COPD (chronic obstructive pulmonary disease) (HCC) 09/29/2022   Atherosclerotic heart disease 09/07/2021   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/11/2021   Acute systolic heart failure (HCC) 06/06/2021   Abscess of perineum 07/17/2018   Type 2 diabetes mellitus with hyperglycemia (HCC) 07/09/2018   Nicotine dependence, unspecified, uncomplicated 03/18/2018   Male erectile dysfunction, unspecified 05/17/2017   Chest pain 12/20/2016   Postural hypotension 12/20/2016   Syncope 12/20/2016   Anemia, unspecified 12/14/2016    Patient's Medications  New Prescriptions   No medications on file  Previous Medications   ACETAMINOPHEN (TYLENOL) 325 MG TABLET    Take 2 tablets (650 mg total) by mouth every 6 (six) hours as needed for mild pain, fever or headache.   AMOXICILLIN-CLAVULANATE (AUGMENTIN) 875-125 MG TABLET    Take 1 tablet by mouth every 12 (twelve) hours for 22 days.   ASPIRIN EC 81 MG TABLET    Take 1 tablet (81 mg total) by mouth daily. Swallow whole.   CLOPIDOGREL (PLAVIX) 75 MG TABLET    Take 1 tablet (75 mg total) by mouth daily.   DAPAGLIFLOZIN PROPANEDIOL (FARXIGA) 10 MG TABS TABLET    Take 1 tablet (10 mg total) by mouth daily before breakfast.   DICLOFENAC (VOLTAREN) 75 MG EC TABLET    Take 75 mg by mouth 2 (two) times  daily.   DOXYCYCLINE (VIBRAMYCIN) 100 MG CAPSULE    Take 1 capsule (100 mg total) by mouth 2 (two) times daily.   EZETIMIBE (ZETIA) 10 MG TABLET    Take 1 tablet (10 mg total) by mouth daily.   GABAPENTIN (NEURONTIN) 600 MG TABLET    Take 600 mg by mouth 3 (three) times daily.   GEMFIBROZIL (LOPID) 600 MG TABLET    Take 600 mg by mouth 2 (two) times daily before a meal.   INSULIN ASPART (NOVOLOG) 100 UNIT/ML INJECTION    Inject 0-9 Units into the skin 3 (three) times daily with meals. CBG < 70: Implement Hypoglycemia Standing Orders CBG 70 - 120: 0 units CBG 121 - 150: 1 unit CBG 151 - 200: 2 units CBG 201 - 250: 3 units CBG 251 - 300: 5 units CBG 301 - 350: 7 units CBG 351 - 400: 9 units CBG > 400: call MD and obtain STAT lab verification   INSULIN GLARGINE (BASAGLAR KWIKPEN [place])    Inject 2-50 Units into the skin 2 (two) times daily.   INSULIN SYRINGE-NEEDLE U-100 30G X 1/2" 0.3 ML MISC    Use with Novolog   MELATONIN 10 MG TABS    Take 10 mg by mouth at bedtime.   METFORMIN (GLUCOPHAGE) 1000 MG TABLET    Take 1,000 mg by mouth 2 (two) times daily.   NICOTINE (NICODERM CQ - DOSED IN MG/24 HOURS)  14 MG/24HR PATCH    Place 1 patch (14 mg total) onto the skin daily.   NITROGLYCERIN (NITROSTAT) 0.4 MG SL TABLET    Place 1 tablet (0.4 mg total) under the tongue every 5 (five) minutes as needed for chest pain.   OXYCODONE (OXY IR/ROXICODONE) 5 MG IMMEDIATE RELEASE TABLET    Take 5 mg by mouth every 6 (six) hours as needed for breakthrough pain or moderate pain.   PANTOPRAZOLE (PROTONIX) 40 MG TABLET    Take 1 tablet (40 mg total) by mouth 2 (two) times daily.   POLYETHYLENE GLYCOL (MIRALAX / GLYCOLAX) 17 G PACKET    Take 17 g by mouth daily as needed for mild constipation.   SENNA-DOCUSATE (SENOKOT-S) 8.6-50 MG TABLET    Take 1 tablet by mouth 2 (two) times daily.  Modified Medications   No medications on file  Discontinued Medications   No medications on file    Subjective: 57 year old male with  past medical history as below presents for hospital follow-up of fourth metatarsal osteomyelitis.  Patient underwent I&D with resection of fifth metatarsal head on 6/11 cultures growing MRSA.  He then received oritavancin on 6/15 transition to Doxy x 2 weeks to complete a total 2 weeks antibiotics from the OR.  He had undergone ortogram and tibioperoneal angioplasty due to critical limb ischemia with vascular surgery on 6/14.  Taken back to the OR on 6/19 as there was necrosis laterally to the foot again underwent I&D and washout left foot with cultures growing MRSA and E faecalis.  Discharged on Doxy and Augmentin x 6 weeks EOT 7/30.  Opted for a long course of antibiotics as there may be a vascular component hindering wound healing in his case. Since discharge she has been seen by podiatry, Dr. Annamary Rummage on 7/15.  Noted that left foot progressing as expected postoperatively.  Small area of central dehiscence that is superficial in left nature less than 1/2 centimeter.  Staples removed at the visit. Today 12/12/2022: Patient reports he feels well.  He reports that he just has about a week left of antibiotics.  Notes no issues at end of antibiotics. Review of Systems: Review of Systems  All other systems reviewed and are negative.   Past Medical History:  Diagnosis Date   COPD (chronic obstructive pulmonary disease) (HCC)    Diabetes mellitus, type 2 (HCC)    HLD (hyperlipidemia)    Hypertension    Tobacco abuse     Social History   Tobacco Use   Smoking status: Every Day    Current packs/day: 0.22    Types: Cigarettes   Smokeless tobacco: Current  Vaping Use   Vaping status: Never Used  Substance Use Topics   Alcohol use: Yes    Comment: occasionally   Drug use: Never    Family History  Problem Relation Age of Onset   Hypertension Mother    Cancer Mother    Heart disease Father     Allergies  Allergen Reactions   Lisinopril     Leg swelling   Statins     Leg swelling     Health Maintenance  Topic Date Due   FOOT EXAM  Never done   OPHTHALMOLOGY EXAM  Never done   Diabetic kidney evaluation - Urine ACR  Never done   Hepatitis C Screening  Never done   DTaP/Tdap/Td (1 - Tdap) Never done   Colonoscopy  Never done   Zoster Vaccines- Shingrix (1 of 2) Never done  COVID-19 Vaccine (1 - 2023-24 season) Never done   INFLUENZA VACCINE  12/21/2022   HEMOGLOBIN A1C  04/13/2023   Diabetic kidney evaluation - eGFR measurement  11/24/2023   HIV Screening  Completed   HPV VACCINES  Aged Out    Objective:  There were no vitals filed for this visit. There is no height or weight on file to calculate BMI.  Physical Exam Constitutional:      General: He is not in acute distress.    Appearance: He is normal weight. He is not toxic-appearing.  HENT:     Head: Normocephalic and atraumatic.     Right Ear: External ear normal.     Left Ear: External ear normal.     Nose: No congestion or rhinorrhea.     Mouth/Throat:     Mouth: Mucous membranes are moist.     Pharynx: Oropharynx is clear.  Eyes:     Extraocular Movements: Extraocular movements intact.     Conjunctiva/sclera: Conjunctivae normal.     Pupils: Pupils are equal, round, and reactive to light.  Cardiovascular:     Rate and Rhythm: Normal rate and regular rhythm.     Heart sounds: No murmur heard.    No friction rub. No gallop.  Pulmonary:     Effort: Pulmonary effort is normal.     Breath sounds: Normal breath sounds.  Abdominal:     General: Abdomen is flat. Bowel sounds are normal.     Palpations: Abdomen is soft.  Musculoskeletal:        General: No swelling.     Cervical back: Normal range of motion and neck supple.  Skin:    General: Skin is warm and dry.  Neurological:     General: No focal deficit present.     Mental Status: He is oriented to person, place, and time.  Psychiatric:        Mood and Affect: Mood normal.     Lab Results Lab Results  Component Value Date    WBC 7.5 11/27/2022   HGB 9.4 (L) 11/27/2022   HCT 28.6 (L) 11/27/2022   MCV 86.4 11/27/2022   PLT 329 11/27/2022    Lab Results  Component Value Date   CREATININE 0.99 11/24/2022   BUN 23 (H) 11/24/2022   NA 138 11/24/2022   K 4.7 11/24/2022   CL 99 11/24/2022   CO2 25 11/24/2022    Lab Results  Component Value Date   ALT 11 11/23/2022   AST 15 11/23/2022   ALKPHOS 110 11/23/2022   BILITOT 0.4 11/23/2022    Lab Results  Component Value Date   CHOL 148 11/04/2022   HDL 21 (L) 11/04/2022   LDLCALC 76 11/04/2022   LDLDIRECT 81 09/04/2022   TRIG 253 (H) 11/04/2022   CHOLHDL 7.0 11/04/2022   No results found for: "LABRPR", "RPRTITER" No results found for: "HIV1RNAQUANT", "HIV1RNAVL", "CD4TABS"   Problem List Items Addressed This Visit   None  Assessment/Plan 57 year old male admitted with fifth MTH osteomyelitis . #Fifth metatarsal osteomyelitis -Seen by Podiatry Dr. Gaye Pollack 7/15 noted to be progressing as expected -Plan to complete doxycycline and Augmentin x 6 weeks from the OR EOT 7/30.  I looked at his wound today, no signs of active infection from what I can see.  Appears to be healing. -Labs today. - Follow-up with infectious disease in 1 month to monitor and do labs off of antibiotics.  Danelle Earthly, MD [facility] 12/12/2022, 6:16 AM   I have personally spent 45 minutes involved in face-to-face and non-face-to-face activities for this patient on the day of the visit. Professional time spent includes the following activities: Preparing to see the patient (review of tests), Obtaining and/or reviewing separately obtained history (admission/discharge record), Performing a medically appropriate examination and/or evaluation , Ordering medications/tests/procedures, referring and communicating with other health care professionals, Documenting clinical information in the EMR, Independently interpreting results  (not separately reported), Communicating results to the patient/family/caregiver, Counseling and educating the patient/family/caregiver and Care coordination (not separately reported).

## 2022-12-13 LAB — COMPLETE METABOLIC PANEL WITH GFR
AG Ratio: 1.5 (calc) (ref 1.0–2.5)
ALT: 6 U/L — ABNORMAL LOW (ref 9–46)
AST: 10 U/L (ref 10–35)
Albumin: 4 g/dL (ref 3.6–5.1)
Alkaline phosphatase (APISO): 85 U/L (ref 35–144)
BUN/Creatinine Ratio: 29 (calc) — ABNORMAL HIGH (ref 6–22)
BUN: 47 mg/dL — ABNORMAL HIGH (ref 7–25)
CO2: 19 mmol/L — ABNORMAL LOW (ref 20–32)
Calcium: 9.5 mg/dL (ref 8.6–10.3)
Chloride: 109 mmol/L (ref 98–110)
Creat: 1.6 mg/dL — ABNORMAL HIGH (ref 0.70–1.30)
Globulin: 2.7 g/dL (calc) (ref 1.9–3.7)
Glucose, Bld: 222 mg/dL — ABNORMAL HIGH (ref 65–99)
Potassium: 5.6 mmol/L — ABNORMAL HIGH (ref 3.5–5.3)
Sodium: 139 mmol/L (ref 135–146)
Total Bilirubin: 0.2 mg/dL (ref 0.2–1.2)
Total Protein: 6.7 g/dL (ref 6.1–8.1)
eGFR: 50 mL/min/{1.73_m2} — ABNORMAL LOW (ref >=60)

## 2022-12-13 LAB — CBC WITH DIFFERENTIAL/PLATELET
Basophils Relative: 0.8 %
Eosinophils Absolute: 327 cells/uL (ref 15–500)
Eosinophils Relative: 3.8 %
MCHC: 31.6 g/dL — ABNORMAL LOW (ref 32.0–36.0)
MPV: 10.2 fL (ref 7.5–12.5)
Neutro Abs: 5934 cells/uL (ref 1500–7800)
Neutrophils Relative %: 69 %
Platelets: 410 10*3/uL — ABNORMAL HIGH (ref 140–400)
RBC: 3.57 10*6/uL — ABNORMAL LOW (ref 4.20–5.80)
RDW: 13.8 % (ref 11.0–15.0)

## 2022-12-13 LAB — C-REACTIVE PROTEIN: CRP: <3 mg/L (ref <8.0)

## 2022-12-18 ENCOUNTER — Ambulatory Visit: Payer: BC Managed Care – PPO | Admitting: Podiatry

## 2022-12-18 DIAGNOSIS — M86672 Other chronic osteomyelitis, left ankle and foot: Secondary | ICD-10-CM

## 2022-12-18 DIAGNOSIS — Z9889 Other specified postprocedural states: Secondary | ICD-10-CM

## 2022-12-18 NOTE — Progress Notes (Signed)
  Subjective:  Patient ID: Kenneth Bailey, male    DOB: 29-Jan-1966,  MRN: [id number]  Chief Complaint  Patient presents with   Routine Post Op    DOS 11/15/2022 POV#4 Amputation Fith Ray Left. C/o pain to left foot. Unsure if he is walking to much and causing the pain.     DOS: 11/15/2022 Procedure: Partial fifth ray amputation left foot initially performed by Dr. Ralene Cork and then completed by Dr. Loreta Ave and revision surgery completed by Dr. Logan Bores with total amputation of the fifth ray.  57 y.o. male returns for post-op check.  He is now approaching 4 weeks postop on his fifth ray amputation closure.  Patient reports he has had some pain in the left foot near the amputation site.  He denies much drainage.  He has been keeping the area clean dry and wrapped up.  Has not showered.    Review of Systems: Negative except as noted in the HPI. Denies N/V/F/Ch.   Objective:  There were no vitals filed for this visit. There is no height or weight on file to calculate BMI. Constitutional Well developed. Well nourished.  Vascular Foot warm and well perfused. Capillary refill normal to all digits.  Calf is soft and supple, no posterior calf or knee pain, negative Homans' sign  Neurologic Normal speech. Oriented to person, place, and time. Epicritic sensation to light touch grossly present bilaterally.  Dermatologic Skin healing well without signs of infection. Skin edges well coapted without signs of infection.  Improved healing from prior.  Incision has nearly completely healed with eschar present along the incision line aside from very small superficial area at the proximal aspect of the amputation site   Orthopedic: Tenderness to palpation noted about the surgical site.   Multiple view plain film radiographs: Deferred at this visit Assessment:   1. 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 Post-operative state      Plan:  Patient was evaluated and treated and all  questions answered.  S/p foot surgery left -Progressing as expected post-operatively.  Do believe patient is smoking which is delaying his healing.  He did smell like he has been smoking recently.  Recommend smoking cessation otherwise wound may not heal appropriately -XR: Deferred -WB Status: Limited weightbearing as tolerated to heal in postop shoe.  Okay to begin transition back to regular shoes as tolerated -Medications: Finish and continue antibiotics per infectious disease recommendations -Okay to get foot wet in the shower at this juncture but should dry off thoroughly after and rewrap or reuse the adhesive bandage.  Return in about 4 weeks (around 01/15/2023) for f/u L foot 5th ray amp.         [name], DPM [facility]

## 2022-12-19 ENCOUNTER — Other Ambulatory Visit: Payer: Self-pay

## 2022-12-19 DIAGNOSIS — I739 Peripheral vascular disease, unspecified: Secondary | ICD-10-CM

## 2022-12-20 ENCOUNTER — Encounter: Payer: Self-pay | Admitting: Podiatry

## 2022-12-29 ENCOUNTER — Telehealth: Payer: Self-pay

## 2022-12-29 ENCOUNTER — Other Ambulatory Visit: Payer: Self-pay | Admitting: Internal Medicine

## 2022-12-29 DIAGNOSIS — R7989 Other specified abnormal findings of blood chemistry: Secondary | ICD-10-CM

## 2022-12-29 NOTE — Progress Notes (Signed)
Bmp

## 2022-12-29 NOTE — Telephone Encounter (Signed)
-----   Message from [facility] sent at 12/29/2022  6:09 AM EDT ----- Triage: Could pt come back early next  week for repeat bmp, his creatinine is elevated. Other labs stable

## 2022-12-29 NOTE — Telephone Encounter (Signed)
Patient aware and scheduled for repeat BMP on 8/12.   Lesli Albee, CMA

## 2023-01-01 ENCOUNTER — Other Ambulatory Visit: Payer: BC Managed Care – PPO

## 2023-01-01 ENCOUNTER — Other Ambulatory Visit: Payer: Self-pay

## 2023-01-01 DIAGNOSIS — L02612 Cutaneous abscess of left foot: Secondary | ICD-10-CM

## 2023-01-01 DIAGNOSIS — R7989 Other specified abnormal findings of blood chemistry: Secondary | ICD-10-CM

## 2023-01-11 ENCOUNTER — Emergency Department (HOSPITAL_COMMUNITY): Payer: BC Managed Care – PPO

## 2023-01-11 ENCOUNTER — Emergency Department (HOSPITAL_COMMUNITY)
Admission: EM | Admit: 2023-01-11 | Discharge: 2023-01-11 | Disposition: A | Discharge status: Home / Self Care | Payer: BC Managed Care – PPO | Attending: Emergency Medicine | Admitting: Emergency Medicine

## 2023-01-11 ENCOUNTER — Other Ambulatory Visit: Payer: Self-pay

## 2023-01-11 ENCOUNTER — Telehealth: Payer: Self-pay | Admitting: Podiatry

## 2023-01-11 DIAGNOSIS — Z7982 Long term (current) use of aspirin: Secondary | ICD-10-CM | POA: Insufficient documentation

## 2023-01-11 DIAGNOSIS — R600 Localized edema: Secondary | ICD-10-CM | POA: Insufficient documentation

## 2023-01-11 DIAGNOSIS — Z7902 Long term (current) use of antithrombotics/antiplatelets: Secondary | ICD-10-CM | POA: Diagnosis not present

## 2023-01-11 DIAGNOSIS — Z794 Long term (current) use of insulin: Secondary | ICD-10-CM | POA: Diagnosis not present

## 2023-01-11 LAB — BRAIN NATRIURETIC PEPTIDE: B Natriuretic Peptide: 211.4 pg/mL — ABNORMAL HIGH (ref 0.0–100.0)

## 2023-01-11 LAB — CBC WITH DIFFERENTIAL/PLATELET
Abs Immature Granulocytes: 0.08 10*3/uL — ABNORMAL HIGH (ref 0.00–0.07)
Basophils Absolute: 0.1 10*3/uL (ref 0.0–0.1)
Basophils Relative: 0 %
Eosinophils Absolute: 0.3 10*3/uL (ref 0.0–0.5)
Eosinophils Relative: 2 %
HCT: 28.9 % — ABNORMAL LOW (ref 39.0–52.0)
Hemoglobin: 8.7 g/dL — ABNORMAL LOW (ref 13.0–17.0)
Immature Granulocytes: 1 %
Lymphocytes Relative: 11 %
Lymphs Abs: 1.5 10*3/uL (ref 0.7–4.0)
MCH: 25.4 pg — ABNORMAL LOW (ref 26.0–34.0)
MCHC: 30.1 g/dL (ref 30.0–36.0)
MCV: 84.3 fL (ref 80.0–100.0)
Monocytes Absolute: 1.3 10*3/uL — ABNORMAL HIGH (ref 0.1–1.0)
Monocytes Relative: 9 %
Neutro Abs: 10.5 10*3/uL — ABNORMAL HIGH (ref 1.7–7.7)
Neutrophils Relative %: 77 %
Platelets: 265 10*3/uL (ref 150–400)
RBC: 3.43 MIL/uL — ABNORMAL LOW (ref 4.22–5.81)
RDW: 14.5 % (ref 11.5–15.5)
WBC: 13.6 10*3/uL — ABNORMAL HIGH (ref 4.0–10.5)
nRBC: 0 % (ref 0.0–0.2)

## 2023-01-11 LAB — COMPREHENSIVE METABOLIC PANEL
ALT: 11 U/L (ref 0–44)
AST: 14 U/L — ABNORMAL LOW (ref 15–41)
Albumin: 3.2 g/dL — ABNORMAL LOW (ref 3.5–5.0)
Alkaline Phosphatase: 113 U/L (ref 38–126)
Anion gap: 15 (ref 5–15)
BUN: 37 mg/dL — ABNORMAL HIGH (ref 6–20)
CO2: 19 mmol/L — ABNORMAL LOW (ref 22–32)
Calcium: 8.7 mg/dL — ABNORMAL LOW (ref 8.9–10.3)
Chloride: 102 mmol/L (ref 98–111)
Creatinine, Ser: 1.79 mg/dL — ABNORMAL HIGH (ref 0.61–1.24)
GFR, Estimated: 44 mL/min — ABNORMAL LOW (ref >60)
Glucose, Bld: 208 mg/dL — ABNORMAL HIGH (ref 70–99)
Potassium: 4.5 mmol/L (ref 3.5–5.1)
Sodium: 136 mmol/L (ref 135–145)
Total Bilirubin: 0.4 mg/dL (ref 0.3–1.2)
Total Protein: 6.9 g/dL (ref 6.5–8.1)

## 2023-01-11 MED ORDER — POTASSIUM CHLORIDE CRYS ER 20 MEQ PO TBCR: 20.0000 meq | EXTENDED_RELEASE_TABLET | Freq: Every day | ORAL | 0 refills | Status: DC

## 2023-01-11 MED ORDER — FUROSEMIDE 20 MG PO TABS: 20.0000 mg | ORAL_TABLET | Freq: Every day | ORAL | 0 refills | Status: DC

## 2023-01-11 NOTE — Progress Notes (Signed)
Cardiology Office Note:    Date:  01/12/2023   ID:  Tamera Punt, [place] [birth year], MRN [id number]  PCP:  [name], MD  Cardiologist:  Norman Herrlich, MD   Referring MD: Gilda Crease,*  ASSESSMENT:    1. Chronic combined systolic and diastolic heart failure (HCC)   2. Bilateral lower extremity edema   3. Hypertensive heart disease with heart failure (HCC)   4. CAD in native artery   5. Anemia, unspecified type   6. Stage 3b chronic kidney disease (HCC)    PLAN:    In order of problems listed above:  The very complicated visit with multiple gaps in healthcare Addresses anemia iron deficiency Optimize heart failure increasing his diuretic to twice a day restarting Entresto and a quick visit in 2 weeks with her nurse practitioner Assist smoking cessation Chantix starter pack education given nicotine patch Stable CKD and CAD he will need follow-up labs at his next visit  Next appointment 2 weeks with [name] nurse practitioner   Medication Adjustments/Labs and Tests Ordered: Current medicines are reviewed at length with the patient today.  Concerns regarding medicines are outlined above.  Orders Placed This Encounter  Procedures   EKG 12-Lead   No orders of the defined types were placed in this encounter.     History of Present Illness:    Jaethan Duel is a 57 y.o. male seen by me 10/11/2022 after evaluation and treatment by advanced heart failure.  His medical problems include type 2 diabetes hypertensive heart disease hyperlipidemia previous hyperkalemia peripheral arterial disease and incidental finding of pancreatic lesion on CT scan.  He has a history of of CAD and heart failure presenting in January 2023 with decompensated heart failure severely reduced EF 25 to 30% coronary angiography with three-vessel coronary artery disease and underwent CABG 06/10/2021 at the left thoracic artery anastomosed to the LAD vein graft to posterolateral  obtuse marginal and ramus arteries.  Subsequent ejection fraction May 2023 40 to 45% on good guideline directed therapy.  When seen seen by me 10/11/2022 he was on Entresto and SGLT2 inhibitor.   Echocardiogram 09/12/2022 shows mildly reduced ejection fraction 45 to 50% with no regional segmental dysfunction grade 1 diastolic filling GLS mildly reduced -17.7 mild right ventricular dysfunction with TAPSE 1.5 and no significant valvular abnormality.  He was seen [facility] 01/11/2023 for edema laboratory studies showed a hemoglobin of 8.7 white count elevated 13,600 creatinine 1.79 with a GFR 44 cc/min and his chest x-ray did not show findings of heart failure.  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  He had EGD performed with argon coagulation hemostat and clip placement during a recent [facility] at that time with melena anemia required transfusion and was found to have nonbleeding gastric and duodenal ulcers.  There are a lot of issues here He is iron deficient has not been addressed we will check iron B12 levels start oral may require IV iron administration He is down to half pack a day wants to stop smoking we will give her prescription for Chantix starter pack he declines going to quit Smart During hospitalizations his diuretic was stopped Entresto was stopped he does not know why He was also prescribed gabapentin high dose that can cause profound sodium retention and on his own he has decreased it to perhaps 1 dose a week  With the emergency room because of edema he is not short of breath no chest pain palpitation or syncope He is really trying to take care of himself  Past Medical History:  Diagnosis Date   COPD (chronic obstructive pulmonary disease) (HCC)    Diabetes mellitus, type 2 (HCC)    HLD (hyperlipidemia)    Hypertension    Tobacco  abuse     Past Surgical History:  Procedure Laterality Date   ABDOMINAL AORTOGRAM W/LOWER EXTREMITY N/A 11/03/2022   Procedure: ABDOMINAL AORTOGRAM W/LOWER EXTREMITY;  Surgeon: Cephus Shelling, MD;  [facility];  Service: Cardiovascular;  Laterality: N/A;   AMPUTATION Left 10/31/2022   Procedure: AMPUTATION LEFT FIFTH TOE;  Surgeon: Louann Sjogren, DPM;  Location: [facility] OR;  Service: Podiatry;  Laterality: Left;   AMPUTATION Left 11/15/2022   Procedure: AMPUTATION FITH RAY;  Surgeon: Felecia Shelling, DPM;  Location: [facility] OR;  Service: Podiatry;  Laterality: Left;   AMPUTATION TOE Left 10/31/2022   Procedure: IRRIGATION AND DEBRIDEMENTOF LEFT FOOT;  Surgeon: Louann Sjogren, DPM;  Location: [facility] OR;  Service: Podiatry;  Laterality: Left;   BIOPSY  11/05/2022   Procedure: BIOPSY;  Surgeon: Jeani Hawking, MD;  Location: [facility] ENDOSCOPY;  Service: Gastroenterology;;   CORONARY ARTERY BYPASS GRAFT N/A 06/10/2021   Procedure: CORONARY ARTERY BYPASS GRAFTING (CABG) TIMES FOUR, USING LEFT INTERNAL MAMMARY ARTERY AND RIGHT GREATER SAPHENOUS VEIN HARVESTED ENDOSCOPICALLY;  Surgeon: Lovett Sox, MD;  Location: [facility] OR;  Service: Open Heart Surgery;  Laterality: N/A;   ENDOVEIN HARVEST OF GREATER SAPHENOUS VEIN Right 06/10/2021   Procedure: ENDOVEIN HARVEST OF GREATER SAPHENOUS VEIN;  Surgeon: Lovett Sox, MD;  [facility]: [facility];  Service: Open Heart Surgery;  Laterality: Right;   ESOPHAGOGASTRODUODENOSCOPY N/A 11/24/2022   Procedure: ESOPHAGOGASTRODUODENOSCOPY (EGD);  Surgeon: Lemar Lofty., MD;  Location: [facility] ENDOSCOPY;  Service: Gastroenterology;  Laterality: N/A;   ESOPHAGOGASTRODUODENOSCOPY (EGD) WITH PROPOFOL N/A 11/05/2022   Procedure: ESOPHAGOGASTRODUODENOSCOPY (EGD) WITH PROPOFOL;  Surgeon: Jeani Hawking, MD;  Location: [facility] ENDOSCOPY;  Service: Gastroenterology;  Laterality: N/A;   HEMOSTASIS CLIP PLACEMENT  11/24/2022   Procedure: HEMOSTASIS CLIP PLACEMENT;  Surgeon: Lemar Lofty., MD;  Location: [facility] ENDOSCOPY;  Service: Gastroenterology;;   HEMOSTASIS CONTROL  11/24/2022   Procedure: HEMOSTASIS CONTROL;  Surgeon: Lemar Lofty., MD;  Location: [facility] ENDOSCOPY;  Service: Gastroenterology;;   HOT HEMOSTASIS N/A 11/24/2022   Procedure: HOT HEMOSTASIS (ARGON PLASMA COAGULATION/BICAP);  Surgeon: Lemar Lofty., MD;  Location: [facility] ENDOSCOPY;  Service: Gastroenterology;  Laterality: N/A;   I & D EXTREMITY Left 11/08/2022   Procedure: IRRIGATION AND DEBRIDEMENT OF FOOT AND WASHOUT;  Surgeon: Vivi Barrack, DPM;  [facility];  Service: Podiatry;  Laterality: Left;   PERIPHERAL VASCULAR BALLOON ANGIOPLASTY  11/03/2022   Procedure: PERIPHERAL VASCULAR BALLOON ANGIOPLASTY;  Surgeon: Cephus Shelling, MD;  [facility];  Service: Cardiovascular;;   RIGHT/LEFT HEART CATH AND CORONARY ANGIOGRAPHY N/A 06/06/2021   Procedure: RIGHT/LEFT HEART CATH AND CORONARY ANGIOGRAPHY;  Surgeon: Dolores Patty, MD;  [facility];  Service: Cardiovascular;  Laterality: N/A;   TEE WITHOUT CARDIOVERSION N/A 06/10/2021   Procedure: TRANSESOPHAGEAL ECHOCARDIOGRAM (TEE);  Surgeon: Lovett Sox, MD;  Location[facility] OR;  Service: Open Heart Surgery;  Laterality: N/A;    Current Medications: Current Meds  Medication Sig   acetaminophen (TYLENOL) 325 MG tablet Take 2 tablets (650 mg total) by mouth every 6 (six) hours as needed for mild pain, fever  or headache.   aspirin EC 81 MG tablet Take 1 tablet (81 mg total) by mouth daily. Swallow whole.   clopidogrel (PLAVIX) 75 MG tablet Take 1 tablet (75 mg total) by mouth daily.   dapagliflozin propanediol (FARXIGA) 10 MG TABS tablet Take 1 tablet (10 mg total) by mouth daily before breakfast.   diclofenac (VOLTAREN) 75 MG EC tablet Take 75 mg by mouth 2 (two) times daily.   ezetimibe (ZETIA) 10 MG tablet Take 1 tablet (10 mg total) by mouth daily.   furosemide (LASIX) 20 MG tablet Take 1 tablet  (20 mg total) by mouth daily.   gabapentin (NEURONTIN) 600 MG tablet Take 600 mg by mouth 3 (three) times daily.   gemfibrozil (LOPID) 600 MG tablet Take 600 mg by mouth 2 (two) times daily before a meal.   insulin aspart (NOVOLOG) 100 UNIT/ML injection Inject 0-9 Units into the skin 3 (three) times daily with meals. CBG < 70: Implement Hypoglycemia Standing Orders CBG 70 - 120: 0 units CBG 121 - 150: 1 unit CBG 151 - 200: 2 units CBG 201 - 250: 3 units CBG 251 - 300: 5 units CBG 301 - 350: 7 units CBG 351 - 400: 9 units CBG > 400: call MD and obtain STAT lab verification   Insulin Glargine (BASAGLAR KWIKPEN [place]) Inject 2-50 Units into the skin 2 (two) times daily.   Insulin Syringe-Needle U-100 30G X 1/2" 0.3 ML MISC Use with Novolog   Melatonin 10 MG TABS Take 10 mg by mouth at bedtime.   metFORMIN (GLUCOPHAGE) 1000 MG tablet Take 1,000 mg by mouth 2 (two) times daily.   nicotine (NICODERM CQ - DOSED IN MG/24 HOURS) 14 mg/24hr patch Place 1 patch (14 mg total) onto the skin daily.   nitroGLYCERIN (NITROSTAT) 0.4 MG SL tablet Place 1 tablet (0.4 mg total) under the tongue every 5 (five) minutes as needed for chest pain.   oxyCODONE (OXY IR/ROXICODONE) 5 MG immediate release tablet Take 5 mg by mouth every 6 (six) hours as needed for breakthrough pain or moderate pain.   pantoprazole (PROTONIX) 40 MG tablet Take 1 tablet (40 mg total) by mouth 2 (two) times daily.   polyethylene glycol (MIRALAX / GLYCOLAX) 17 g packet Take 17 g by mouth daily as needed for mild constipation.   potassium chloride SA (KLOR-CON M) 20 MEQ tablet Take 1 tablet (20 mEq total) by mouth daily.   senna-docusate (SENOKOT-S) 8.6-50 MG tablet Take 1 tablet by mouth 2 (two) times daily.     Allergies:   Lisinopril and Statins   Social History   Socioeconomic History   Marital status: Divorced    Spouse name: Not on file   Number of children: Not on file   Years of education: Not on file   Highest education level: Not on  file  Occupational History   Not on file  Tobacco Use   Smoking status: Every Day    Current packs/day: 0.22    Types: Cigarettes   Smokeless tobacco: Current  Vaping Use   Vaping status: Never Used  Substance and Sexual Activity   Alcohol use: Yes    Comment: occasionally   Drug use: Never   Sexual activity: Not on file  Other Topics Concern   Not on file  Social History Narrative   Not on file   Social Determinants of Health   Financial Resource Strain: Not at Risk (06/14/2022)   Received from [employer] [employer]  Financial Resource Strain: 1  Food Insecurity: No Food Insecurity (11/24/2022)   Hunger Vital Sign    Worried About Running Out of Food in the Last Year: Never true    Ran Out of Food in the Last Year: Never true  Transportation Needs: No Transportation Needs (11/24/2022)   PRAPARE - Administrator, Civil Service (Medical): No    Lack of Transportation (Non-Medical): No  Physical Activity: Not at Risk (06/14/2022)   Received from [place], [place]   Physical Activity    Physical Activity: 1  Stress: Not at Risk (06/14/2022)   Received from [facility] [place], [place]   Stress    Stress: 1  Social Connections: Not at Risk (06/14/2022)   Received from [place], [place]   Social Connections    Social Connections and Isolation: 1     Family History: The patient's family history includes Cancer in his mother; Heart disease in his father; Hypertension in his mother.  ROS:   ROS Please see the history of present illness.     All other systems reviewed and are negative.  EKGs/Labs/Other Studies Reviewed:    The following studies were reviewed today:    Recent Labs: 11/17/2022: Magnesium 1.7 01/11/2023: ALT 11; B Natriuretic Peptide 211.4; BUN 37; Creatinine, Ser 1.79; Hemoglobin 8.7; Platelets 265; Potassium 4.5; Sodium 136  Recent Lipid Panel    Component Value Date/Time   CHOL 148 11/04/2022 0123   TRIG 253 (H) 11/04/2022 0123   HDL 21 (L)  11/04/2022 0123   CHOLHDL 7.0 11/04/2022 0123   VLDL 51 (H) 11/04/2022 0123   LDLCALC 76 11/04/2022 0123   LDLDIRECT 81 09/04/2022 1546    Physical Exam:    VS:  BP 116/68 (BP Location: Right Arm, Patient Position: Sitting, Cuff Size: Normal)   Pulse 77   Ht 5\' 8"  (1.727 m)   Wt 190 lb 3.2 oz (86.3 kg)   SpO2 100%   BMI 28.92 kg/m     Wt Readings from Last 3 Encounters:  01/12/23 190 lb 3.2 oz (86.3 kg)  12/12/22 173 lb 14.4 oz (78.9 kg)  12/12/22 173 lb (78.5 kg)     GEN:  Well nourished, well developed in no acute distress HEENT: Normal NECK: No JVD; No carotid bruits LYMPHATICS: No lymphadenopathy CARDIAC: RRR, no murmurs, rubs, gallops RESPIRATORY:  Clear to auscultation without rales, wheezing or rhonchi  ABDOMEN: Soft, non-tender, non-distended MUSCULOSKELETAL: Bilateral lower extremity 2+ pitting edema; No deformity  SKIN: Warm and dry NEUROLOGIC:  Alert and oriented x 3 PSYCHIATRIC:  Normal affect     Signed, Norman Herrlich, MD  01/12/2023 9:19 AM    [facility]

## 2023-01-11 NOTE — Telephone Encounter (Signed)
Patient called and left a voicemail yesterday asking if he needed a refill on his antibiotic.  I pulled up his chart and he was seen today (3am)  in the ER @ [name] for bilateral leg edema and they noted he had multiple ant bites on his legs.  They put him on diuretics, but it doesn't look like they put him on any antibiotics.  Not sure if you had discussed placing him on, or renewing, an antibiotic, but wanted to update you on his status.    Thanks.

## 2023-01-11 NOTE — ED Provider Notes (Addendum)
[place] EMERGENCY DEPARTMENT AT [facility] Provider Note   CSN: [id number] Arrival date & time: 01/11/23  0250     History  Chief Complaint  Patient presents with   Legs Swelling     Kenneth Bailey is a 57 y.o. male.  Presents to the emergency department for evaluation of leg swelling.  Patient reports that he noticed it tonight.  No pain.  He does, however, have multiple ant bites on his legs.  No color change.       Home Medications Prior to Admission medications   Medication Sig Start Date End Date Taking? Authorizing Provider  furosemide (LASIX) 20 MG tablet Take 1 tablet (20 mg total) by mouth daily. 01/11/23  Yes Charis Juliana, Canary Brim, MD  potassium chloride SA (KLOR-CON M) 20 MEQ tablet Take 1 tablet (20 mEq total) by mouth daily. 01/11/23  Yes Bentzion Dauria, Canary Brim, MD  acetaminophen (TYLENOL) 325 MG tablet Take 2 tablets (650 mg total) by mouth every 6 (six) hours as needed for mild pain, fever or headache. Patient not taking: Reported on 12/12/2022 11/07/22   Marguerita Merles Latif, DO  aspirin EC 81 MG tablet Take 1 tablet (81 mg total) by mouth daily. Swallow whole. 02/27/22   Andrey Farmer, PA-C  clopidogrel (PLAVIX) 75 MG tablet Take 1 tablet (75 mg total) by mouth daily. 11/07/22   Marguerita Merles Latif, DO  dapagliflozin propanediol (FARXIGA) 10 MG TABS tablet Take 1 tablet (10 mg total) by mouth daily before breakfast. 02/27/22   Andrey Farmer, PA-C  diclofenac (VOLTAREN) 75 MG EC tablet Take 75 mg by mouth 2 (two) times daily.    [provider]  ezetimibe (ZETIA) 10 MG tablet Take 1 tablet (10 mg total) by mouth daily. 11/08/22   Marguerita Merles Latif, DO  gabapentin (NEURONTIN) 600 MG tablet Take 600 mg by mouth 3 (three) times daily.    [provider]  gemfibrozil (LOPID) 600 MG tablet Take 600 mg by mouth 2 (two) times daily before a meal.    [provider]  insulin aspart (NOVOLOG) 100 UNIT/ML injection  Inject 0-9 Units into the skin 3 (three) times daily with meals. CBG < 70: Implement Hypoglycemia Standing Orders CBG 70 - 120: 0 units CBG 121 - 150: 1 unit CBG 151 - 200: 2 units CBG 201 - 250: 3 units CBG 251 - 300: 5 units CBG 301 - 350: 7 units CBG 351 - 400: 9 units CBG > 400: call MD and obtain STAT lab verification 11/17/22   Marguerita Merles Latif, DO  Insulin Glargine (BASAGLAR KWIKPEN [place]) Inject 2-50 Units into the skin 2 (two) times daily.    [provider]  Insulin Syringe-Needle U-100 30G X 1/2" 0.3 ML MISC Use with Novolog 11/17/22   Marguerita Merles Latif, DO  Melatonin 10 MG TABS Take 10 mg by mouth at bedtime.    [provider]  metFORMIN (GLUCOPHAGE) 1000 MG tablet Take 1,000 mg by mouth 2 (two) times daily. 05/26/21   [provider]  nicotine (NICODERM CQ - DOSED IN MG/24 HOURS) 14 mg/24hr patch Place 1 patch (14 mg total) onto the skin daily. 11/08/22   Marguerita Merles Latif, DO  nitroGLYCERIN (NITROSTAT) 0.4 MG SL tablet Place 1 tablet (0.4 mg total) under the tongue every 5 (five) minutes as needed for chest pain. 09/04/22 12/03/22  Bensimhon, Bevelyn Buckles, MD  oxyCODONE (OXY IR/ROXICODONE) 5 MG immediate release tablet Take 5 mg by mouth every 6 (  six) hours as needed for breakthrough pain or moderate pain. Patient not taking: Reported on 12/12/2022    [provider]  pantoprazole (PROTONIX) 40 MG tablet Take 1 tablet (40 mg total) by mouth 2 (two) times daily. Patient not taking: Reported on 12/12/2022 11/07/22 01/06/23  Marguerita Merles Latif, DO  polyethylene glycol (MIRALAX / GLYCOLAX) 17 g packet Take 17 g by mouth daily as needed for mild constipation. Patient not taking: Reported on 12/12/2022 11/07/22   Marguerita Merles Latif, DO  senna-docusate (SENOKOT-S) 8.6-50 MG tablet Take 1 tablet by mouth 2 (two) times daily. Patient not taking: Reported on 12/12/2022 11/27/22   Azucena Fallen, MD      Allergies    Lisinopril and Statins    Review of Systems    Review of Systems  Physical Exam Updated Vital Signs BP (!) 144/77   Pulse (!) 101   Temp 98.9 F (37.2 C)   Resp 16   SpO2 98%  Physical Exam Vitals and nursing note reviewed.  Constitutional:      General: He is not in acute distress.    Appearance: He is well-developed.  HENT:     Head: Normocephalic and atraumatic.     Mouth/Throat:     Mouth: Mucous membranes are moist.  Eyes:     General: Vision grossly intact. Gaze aligned appropriately.     Extraocular Movements: Extraocular movements intact.     Conjunctiva/sclera: Conjunctivae normal.  Cardiovascular:     Rate and Rhythm: Normal rate and regular rhythm.     Pulses: Normal pulses.     Heart sounds: Normal heart sounds, S1 normal and S2 normal. No murmur heard.    No friction rub. No gallop.  Pulmonary:     Effort: Pulmonary effort is normal. No respiratory distress.     Breath sounds: Normal breath sounds.  Abdominal:     Palpations: Abdomen is soft.     Tenderness: There is no abdominal tenderness. There is no guarding or rebound.     Hernia: No hernia is present.  Musculoskeletal:        General: No swelling.     Cervical back: Full passive range of motion without pain, normal range of motion and neck supple. No pain with movement, spinous process tenderness or muscular tenderness. Normal range of motion.     Right lower leg: Edema present.     Left lower leg: Edema present.  Skin:    General: Skin is warm and dry.     Capillary Refill: Capillary refill takes less than 2 seconds.     Findings: No ecchymosis, erythema, lesion or wound.  Neurological:     Mental Status: He is alert and oriented to person, place, and time.     GCS: GCS eye subscore is 4. GCS verbal subscore is 5. GCS motor subscore is 6.     Cranial Nerves: Cranial nerves 2-12 are intact.     Sensory: Sensation is intact.     Motor: Motor function is intact. No weakness or abnormal muscle tone.     Coordination: Coordination is intact.   Psychiatric:        Mood and Affect: Mood normal.        Speech: Speech normal.        Behavior: Behavior normal.     ED Results / Procedures / Treatments   Labs (all labs ordered are listed, but only abnormal results are displayed) Labs Reviewed  CBC WITH DIFFERENTIAL/PLATELET - Abnormal; Notable  for the following components:      Result Value   WBC 13.6 (*)    RBC 3.43 (*)    Hemoglobin 8.7 (*)    HCT 28.9 (*)    MCH 25.4 (*)    Neutro Abs 10.5 (*)    Monocytes Absolute 1.3 (*)    Abs Immature Granulocytes 0.08 (*)    All other components within normal limits  COMPREHENSIVE METABOLIC PANEL - Abnormal; Notable for the following components:   CO2 19 (*)    Glucose, Bld 208 (*)    BUN 37 (*)    Creatinine, Ser 1.79 (*)    Calcium 8.7 (*)    Albumin 3.2 (*)    AST 14 (*)    GFR, Estimated 44 (*)    All other components within normal limits  BRAIN NATRIURETIC PEPTIDE - Abnormal; Notable for the following components:   B Natriuretic Peptide 211.4 (*)    All other components within normal limits    EKG None  Radiology DG Chest 2 View  Result Date: 01/11/2023 CLINICAL DATA:  57 year old male history of bilateral lower extremity swelling. Shortness of breath. EXAM: CHEST - 2 VIEW COMPARISON:  Chest x-ray 11/04/2022. FINDINGS: Lung volumes are normal. No consolidative airspace disease. No pleural effusions. No pneumothorax. No pulmonary nodule or mass noted. Pulmonary vasculature and the cardiomediastinal silhouette are within normal limits. Status post median sternotomy for CABG. IMPRESSION: 1. No radiographic evidence of acute cardiopulmonary disease. Electronically Signed   By: Trudie Reed M.D.   On: 01/11/2023 05:28    Procedures Procedures    Medications Ordered in ED Medications - No data to display  ED Course/ Medical Decision Making/ A&P                                 Medical Decision Making Amount and/or Complexity of Data Reviewed External Data  Reviewed: labs, radiology and notes.    Details: Echo 08/2022   Left Ventricle: Left ventricular ejection fraction, by estimation, is 45  to 50%. The left ventricle has mildly decreased function. The left  ventricle has no regional wall motion abnormalities. The average left  ventricular global longitudinal strain is  -17.7 %. The global longitudinal strain is abnormal. The left ventricular  internal cavity size was normal in size. There is borderline left  ventricular hypertrophy. Left ventricular diastolic parameters are  consistent with Grade I diastolic dysfunction  (impaired relaxation). Normal left ventricular filling pressure.   Right Ventricle: TAPSE 1.5. The right ventricular size is normal. No  increase in right ventricular wall thickness. Right ventricular systolic  function is mildly reduced.   Left Atrium: Left atrial size was normal in size.   Right Atrium: Right atrial size was normal in size.   Pericardium: There is no evidence of pericardial effusion.   Mitral Valve: The mitral valve is normal in structure. No evidence of  mitral valve regurgitation. No evidence of mitral valve stenosis.   Tricuspid Valve: The tricuspid valve is normal in structure. Tricuspid  valve regurgitation is not demonstrated. No evidence of tricuspid  stenosis.   Aortic Valve: The aortic valve is tricuspid. Aortic valve regurgitation is  not visualized. No aortic stenosis is present.   Pulmonic Valve: The pulmonic valve was normal in structure. Pulmonic valve  regurgitation is not visualized. No evidence of pulmonic stenosis.   Aorta: The aortic root and ascending aorta are structurally normal, with  no evidence of dilitation, Normal DTA and the aortic arch was not well  visualized.   Venous: A normal flow pattern is recorded from the right upper pulmonary  vein. The inferior vena cava is normal in size with greater than 50%  respiratory variability, suggesting right atrial pressure  of 3 mmHg.  Labs: ordered. Decision-making details documented in ED Course.    Details:    Radiology: ordered and independent interpretation performed. Decision-making details documented in ED Course.  Risk Prescription drug management.   Differential diagnosis considered includes, but not limited to:  CHF; peripheral edema; ischemic limb; DVT; cellulitis; phlebitis   Patient presents to the emergency department for evaluation of leg swelling.  Patient does have a cardiac history.  Reviewing his records reveals mildly decreased ejection fraction of 45% seen in April of this year.  He does not take diuretics.  Examination reveals 1+ edema both lower extremities.  Lungs are clear, no gallop, no rales.  Chest x-ray clear.  BNP mildly elevated.  No associated chest pain.  Renal function normal.  Electrolytes normal.  No signs of cellulitic changes.  Patient in no distress.  No pulmonary edema or hypoxia.  Patient with known reduced ejection fraction.  Patient will be given course of diuretics, follow-up with primary care and cardiology.  Return for chest pain, worsening swelling, shortness of breath.        Final Clinical Impression(s) / ED Diagnoses Final diagnoses:  Bilateral leg edema    Rx / DC Orders ED Discharge Orders          Ordered    furosemide (LASIX) 20 MG tablet  Daily        01/11/23 0610    potassium chloride SA (KLOR-CON M) 20 MEQ tablet  Daily        01/11/23 0610    Ambulatory referral to Cardiology       Comments: If you have not heard from the Cardiology office within the next 72 hours please call [phone].   01/11/23 8657              Gilda Crease, MD 01/11/23 8469    Gilda Crease, MD 01/11/23 [phone]

## 2023-01-11 NOTE — ED Triage Notes (Signed)
Patient noticed bilateral lower legs swelling this evening , also reported ant bites at lower legs .

## 2023-01-12 ENCOUNTER — Encounter: Payer: Self-pay | Admitting: Cardiology

## 2023-01-12 ENCOUNTER — Ambulatory Visit: Payer: BC Managed Care – PPO | Attending: Cardiology | Admitting: Cardiology

## 2023-01-12 VITALS — BP 116/68 | HR 77 | Ht 68.0 in | Wt 190.2 lb

## 2023-01-12 DIAGNOSIS — R600 Localized edema: Secondary | ICD-10-CM

## 2023-01-12 DIAGNOSIS — N1832 Chronic kidney disease, stage 3b: Secondary | ICD-10-CM

## 2023-01-12 DIAGNOSIS — I110 Hypertensive heart disease with heart failure: Secondary | ICD-10-CM | POA: Diagnosis not present

## 2023-01-12 DIAGNOSIS — I2510 Atherosclerotic heart disease of native coronary artery without angina pectoris: Secondary | ICD-10-CM | POA: Diagnosis not present

## 2023-01-12 DIAGNOSIS — D649 Anemia, unspecified: Secondary | ICD-10-CM

## 2023-01-12 DIAGNOSIS — I5042 Chronic combined systolic (congestive) and diastolic (congestive) heart failure: Secondary | ICD-10-CM | POA: Diagnosis not present

## 2023-01-12 MED ORDER — FUROSEMIDE 20 MG PO TABS: 20.0000 mg | ORAL_TABLET | Freq: Two times a day (BID) | ORAL | 0 refills | Status: DC

## 2023-01-12 MED ORDER — ENTRESTO 24-26 MG PO TABS: 1.0000 | ORAL_TABLET | Freq: Two times a day (BID) | ORAL | 3 refills | Status: DC

## 2023-01-12 MED ORDER — VARENICLINE TARTRATE (STARTER) 0.5 MG X 11 & 1 MG X 42 PO TBPK: 0.5000 mg | ORAL_TABLET | ORAL | 0 refills | Status: DC

## 2023-01-12 MED ORDER — NICOTINE 21 MG/24HR TD PT24: 21.0000 mg | MEDICATED_PATCH | Freq: Every day | TRANSDERMAL | 3 refills | Status: DC

## 2023-01-12 MED ORDER — FERROUS SULFATE 325 (65 FE) MG PO TBEC: 325.0000 mg | DELAYED_RELEASE_TABLET | Freq: Every day | ORAL | 3 refills | Status: DC

## 2023-01-12 NOTE — Patient Instructions (Signed)
Medication Instructions:  Your physician has recommended you make the following change in your medication:   START: Entresto 24-26 two times daily START: Iron 325 mg daily START: Nicotine patch 21 mg daily START: Chantix starter pack 0.5 mg as directed START: Furosemide 20 mg twice daily  *If you need a refill on your cardiac medications before your next appointment, please call your pharmacy*   Lab Work: Your physician recommends that you return for lab work in:   Labs today: Iron, Ferritin, TIBC, Saturation, Vitamin B12  If you have labs (blood work) drawn today and your tests are completely normal, you will receive your results only by: MyChart Message (if you have MyChart) OR A paper copy in the mail If you have any lab test that is abnormal or we need to change your treatment, we will call you to review the results.   Testing/Procedures: None   Follow-Up: At [facility], you and your health needs are our priority.  As part of our continuing mission to provide you with exceptional heart care, we have created designated Provider Care Teams.  These Care Teams include your primary Cardiologist (physician) and Advanced Practice Providers (APPs -  Physician Assistants and Nurse Practitioners) who all work together to provide you with the care you need, when you need it.  We recommend signing up for the patient portal called "MyChart".  Sign up information is provided on this After Visit Summary.  MyChart is used to connect with patients for Virtual Visits (Telemedicine).  Patients are able to view lab/test results, encounter notes, upcoming appointments, etc.  Non-urgent messages can be sent to your provider as well.   To learn more about what you can do with MyChart, go to [email or web address].    Your next appointment:   2 week(s)  Provider:   [name], NP [facility])  Other Instructions None

## 2023-01-13 LAB — IRON,TIBC AND FERRITIN PANEL
Ferritin: 69 ng/mL (ref 30–400)
Iron Saturation: 5 % — CL (ref 15–55)
Iron: 16 ug/dL — ABNORMAL LOW (ref 38–169)
Total Iron Binding Capacity: 337 ug/dL (ref 250–450)
UIBC: 321 ug/dL (ref 111–343)

## 2023-01-13 LAB — VITAMIN B12: Vitamin B-12: 368 pg/mL (ref 232–1245)

## 2023-01-15 ENCOUNTER — Other Ambulatory Visit: Payer: Self-pay

## 2023-01-15 DIAGNOSIS — D649 Anemia, unspecified: Secondary | ICD-10-CM

## 2023-01-16 ENCOUNTER — Other Ambulatory Visit: Payer: Self-pay

## 2023-01-17 ENCOUNTER — Other Ambulatory Visit: Payer: Self-pay

## 2023-01-17 ENCOUNTER — Emergency Department (HOSPITAL_COMMUNITY)
Admission: EM | Admit: 2023-01-17 | Discharge: 2023-01-17 | Disposition: A | Discharge status: Home / Self Care | Payer: BC Managed Care – PPO | Attending: Emergency Medicine | Admitting: Emergency Medicine

## 2023-01-17 ENCOUNTER — Telehealth: Payer: Self-pay

## 2023-01-17 ENCOUNTER — Encounter: Payer: Self-pay | Admitting: Internal Medicine

## 2023-01-17 ENCOUNTER — Ambulatory Visit (INDEPENDENT_AMBULATORY_CARE_PROVIDER_SITE_OTHER): Payer: BC Managed Care – PPO | Admitting: Internal Medicine

## 2023-01-17 VITALS — BP 76/53 | HR 100 | Resp 16 | Ht 68.0 in | Wt 182.6 lb

## 2023-01-17 DIAGNOSIS — Z79899 Other long term (current) drug therapy: Secondary | ICD-10-CM | POA: Insufficient documentation

## 2023-01-17 DIAGNOSIS — J449 Chronic obstructive pulmonary disease, unspecified: Secondary | ICD-10-CM | POA: Insufficient documentation

## 2023-01-17 DIAGNOSIS — Z7902 Long term (current) use of antithrombotics/antiplatelets: Secondary | ICD-10-CM | POA: Diagnosis not present

## 2023-01-17 DIAGNOSIS — I509 Heart failure, unspecified: Secondary | ICD-10-CM | POA: Insufficient documentation

## 2023-01-17 DIAGNOSIS — Z951 Presence of aortocoronary bypass graft: Secondary | ICD-10-CM | POA: Diagnosis not present

## 2023-01-17 DIAGNOSIS — I110 Hypertensive heart disease with heart failure: Secondary | ICD-10-CM | POA: Diagnosis not present

## 2023-01-17 DIAGNOSIS — Z7982 Long term (current) use of aspirin: Secondary | ICD-10-CM | POA: Diagnosis not present

## 2023-01-17 DIAGNOSIS — R7989 Other specified abnormal findings of blood chemistry: Secondary | ICD-10-CM

## 2023-01-17 DIAGNOSIS — E119 Type 2 diabetes mellitus without complications: Secondary | ICD-10-CM | POA: Insufficient documentation

## 2023-01-17 DIAGNOSIS — Z7984 Long term (current) use of oral hypoglycemic drugs: Secondary | ICD-10-CM | POA: Insufficient documentation

## 2023-01-17 DIAGNOSIS — I951 Orthostatic hypotension: Secondary | ICD-10-CM | POA: Insufficient documentation

## 2023-01-17 DIAGNOSIS — Z794 Long term (current) use of insulin: Secondary | ICD-10-CM | POA: Insufficient documentation

## 2023-01-17 LAB — COMPREHENSIVE METABOLIC PANEL
ALT: 10 U/L (ref 0–44)
AST: 13 U/L — ABNORMAL LOW (ref 15–41)
Albumin: 3.5 g/dL (ref 3.5–5.0)
Alkaline Phosphatase: 85 U/L (ref 38–126)
Anion gap: 11 (ref 5–15)
BUN: 19 mg/dL (ref 6–20)
CO2: 27 mmol/L (ref 22–32)
Calcium: 9.6 mg/dL (ref 8.9–10.3)
Chloride: 98 mmol/L (ref 98–111)
Creatinine, Ser: 1.68 mg/dL — ABNORMAL HIGH (ref 0.61–1.24)
GFR, Estimated: 47 mL/min — ABNORMAL LOW (ref >60)
Glucose, Bld: 340 mg/dL — ABNORMAL HIGH (ref 70–99)
Potassium: 5.2 mmol/L — ABNORMAL HIGH (ref 3.5–5.1)
Sodium: 136 mmol/L (ref 135–145)
Total Bilirubin: 0.5 mg/dL (ref 0.3–1.2)
Total Protein: 7.8 g/dL (ref 6.5–8.1)

## 2023-01-17 LAB — CBC WITH DIFFERENTIAL/PLATELET
Abs Immature Granulocytes: 0.09 10*3/uL — ABNORMAL HIGH (ref 0.00–0.07)
Basophils Absolute: 0.1 10*3/uL (ref 0.0–0.1)
Basophils Relative: 1 %
Eosinophils Absolute: 0.3 10*3/uL (ref 0.0–0.5)
Eosinophils Relative: 4 %
HCT: 34.7 % — ABNORMAL LOW (ref 39.0–52.0)
Hemoglobin: 10.3 g/dL — ABNORMAL LOW (ref 13.0–17.0)
Immature Granulocytes: 1 %
Lymphocytes Relative: 14 %
Lymphs Abs: 1.1 10*3/uL (ref 0.7–4.0)
MCH: 24.8 pg — ABNORMAL LOW (ref 26.0–34.0)
MCHC: 29.7 g/dL — ABNORMAL LOW (ref 30.0–36.0)
MCV: 83.4 fL (ref 80.0–100.0)
Monocytes Absolute: 0.5 10*3/uL (ref 0.1–1.0)
Monocytes Relative: 7 %
Neutro Abs: 5.9 10*3/uL (ref 1.7–7.7)
Neutrophils Relative %: 73 %
Platelets: 395 10*3/uL (ref 150–400)
RBC: 4.16 MIL/uL — ABNORMAL LOW (ref 4.22–5.81)
RDW: 14.6 % (ref 11.5–15.5)
WBC: 8 10*3/uL (ref 4.0–10.5)
nRBC: 0 % (ref 0.0–0.2)

## 2023-01-17 LAB — POC OCCULT BLOOD, ED: Fecal Occult Bld: NEGATIVE

## 2023-01-17 MED ORDER — SODIUM CHLORIDE 0.9 % IV BOLUS
500.0000 mL | Freq: Once | INTRAVENOUS | Status: AC
  Administered 2023-01-17: 500 mL via INTRAVENOUS

## 2023-01-17 NOTE — ED Notes (Signed)
NT ambulated  pt he had no issues

## 2023-01-17 NOTE — ED Notes (Signed)
NT

## 2023-01-17 NOTE — ED Provider Notes (Signed)
[place] EMERGENCY DEPARTMENT AT [facility] Provider Note   CSN: [id number] Arrival date & time: 01/17/23  1041     History  Chief Complaint  Patient presents with   Hypotension   abnormal labs    Dvon Kudelka is a 57 y.o. male with past medical history of PUD, upper GI bleed, systolic CHF, hypertension, type 2 diabetes, COPD, history of CABG, nicotine dependence, anemia, and postural hypotension presents to the emergency department following a routine outpatient appointment with [facility].  Physician recommended patient to be evaluated in the emergency department due to hypotension, abnormal lab work (low iron of 5 on 8/23), and history of due to UGIB.  Upon patient assessment, patient reports no complaints at this time.  He states that he "feels fine".    When questioned about low iron, he reports that cardiology was supposed to call him regarding an iron infusion but has not. Patient reports that he does not believe that he has hematochezia and that his stool has changed color since starting his iron pills on 8/23.  Of note he reports that he recently increased his Lasix from daily to twice daily on Friday as instructed by cardiology.  He denies dizziness, headaches, vomiting, diarrhea.  HPI     Home Medications Prior to Admission medications   Medication Sig Start Date End Date Taking? Authorizing Provider  acetaminophen (TYLENOL) 325 MG tablet Take 2 tablets (650 mg total) by mouth every 6 (six) hours as needed for mild pain, fever or headache. 11/07/22   Marguerita Merles Latif, DO  aspirin EC 81 MG tablet Take 1 tablet (81 mg total) by mouth daily. Swallow whole. 02/27/22   Andrey Farmer, PA-C  clopidogrel (PLAVIX) 75 MG tablet Take 1 tablet (75 mg total) by mouth daily. 11/07/22   Marguerita Merles Latif, DO  dapagliflozin propanediol (FARXIGA) 10 MG TABS tablet Take 1 tablet (10 mg total) by mouth daily before breakfast. 02/27/22    Andrey Farmer, PA-C  diclofenac (VOLTAREN) 75 MG EC tablet Take 75 mg by mouth 2 (two) times daily.    [provider]  ezetimibe (ZETIA) 10 MG tablet Take 1 tablet (10 mg total) by mouth daily. 11/08/22   Marguerita Merles Latif, DO  ferrous sulfate (FE TABS) 325 (65 FE) MG EC tablet Take 1 tablet (325 mg total) by mouth daily. 01/12/23   Baldo Daub, MD  furosemide (LASIX) 20 MG tablet Take 1 tablet (20 mg total) by mouth 2 (two) times daily. 01/12/23   Baldo Daub, MD  gabapentin (NEURONTIN) 600 MG tablet Take 600 mg by mouth 3 (three) times daily.    [provider]  gemfibrozil (LOPID) 600 MG tablet Take 600 mg by mouth 2 (two) times daily before a meal.    [provider]  insulin aspart (NOVOLOG) 100 UNIT/ML injection Inject 0-9 Units into the skin 3 (three) times daily with meals. CBG < 70: Implement Hypoglycemia Standing Orders CBG 70 - 120: 0 units CBG 121 - 150: 1 unit CBG 151 - 200: 2 units CBG 201 - 250: 3 units CBG 251 - 300: 5 units CBG 301 - 350: 7 units CBG 351 - 400: 9 units CBG > 400: call MD and obtain STAT lab verification 11/17/22   Marguerita Merles Latif, DO  Insulin Glargine (BASAGLAR KWIKPEN [place]) Inject 2-50 Units into the skin 2 (two) times daily.    [provider]  Insulin Syringe-Needle U-100 30G X 1/2"  0.3 ML MISC Use with Novolog 11/17/22   Marguerita Merles Latif, DO  Melatonin 10 MG TABS Take 10 mg by mouth at bedtime.    [provider]  metFORMIN (GLUCOPHAGE) 1000 MG tablet Take 1,000 mg by mouth 2 (two) times daily. 05/26/21   [provider]  nicotine (NICODERM CQ - DOSED IN MG/24 HOURS) 21 mg/24hr patch Place 1 patch (21 mg total) onto the skin daily. 01/12/23   Baldo Daub, MD  nitroGLYCERIN (NITROSTAT) 0.4 MG SL tablet Place 1 tablet (0.4 mg total) under the tongue every 5 (five) minutes as needed for chest pain. 09/04/22 01/17/23  Bensimhon, Bevelyn Buckles, MD  oxyCODONE (OXY IR/ROXICODONE) 5 MG immediate release  tablet Take 5 mg by mouth every 6 (six) hours as needed for breakthrough pain or moderate pain.    [provider]  pantoprazole (PROTONIX) 40 MG tablet Take 1 tablet (40 mg total) by mouth 2 (two) times daily. 11/07/22 01/17/23  Marguerita Merles Latif, DO  polyethylene glycol (MIRALAX / GLYCOLAX) 17 g packet Take 17 g by mouth daily as needed for mild constipation. 11/07/22   Marguerita Merles Latif, DO  potassium chloride SA (KLOR-CON M) 20 MEQ tablet Take 1 tablet (20 mEq total) by mouth daily. 01/11/23   Gilda Crease, MD  sacubitril-valsartan (ENTRESTO) 24-26 MG Take 1 tablet by mouth 2 (two) times daily. 01/12/23   Baldo Daub, MD  senna-docusate (SENOKOT-S) 8.6-50 MG tablet Take 1 tablet by mouth 2 (two) times daily. 11/27/22   Azucena Fallen, MD  Varenicline Tartrate, Starter, (CHANTIX STARTING MONTH PAK) 0.5 MG X 11 & 1 MG X 42 TBPK Take 0.5 mg by mouth as directed. 01/12/23   Baldo Daub, MD      Allergies    Lisinopril and Statins    Review of Systems   Review of Systems  Constitutional:  Negative for fatigue and fever.  Respiratory:  Negative for cough, chest tightness and shortness of breath.   Cardiovascular:  Negative for chest pain.  Gastrointestinal:  Negative for abdominal pain, anal bleeding, blood in stool, diarrhea, nausea and vomiting.    Physical Exam Updated Vital Signs BP (!) 143/85   Pulse 86   Temp 98.1 F (36.7 C)   Resp 17   SpO2 100%  Physical Exam Constitutional:      Appearance: Normal appearance. He is not ill-appearing or diaphoretic.  Cardiovascular:     Rate and Rhythm: Normal rate.     Pulses: Normal pulses.  Pulmonary:     Effort: Pulmonary effort is normal. No respiratory distress.     Breath sounds: Normal breath sounds.  Abdominal:     Palpations: Abdomen is soft.     Tenderness: There is no abdominal tenderness. There is no guarding.  Skin:    General: Skin is warm.     Capillary Refill: Capillary refill takes less  than 2 seconds.  Neurological:     Mental Status: He is alert and oriented to person, place, and time. Mental status is at baseline.     ED Results / Procedures / Treatments   Labs (all labs ordered are listed, but only abnormal results are displayed) Labs Reviewed  CBC WITH DIFFERENTIAL/PLATELET - Abnormal; Notable for the following components:      Result Value   RBC 4.16 (*)    Hemoglobin 10.3 (*)    HCT 34.7 (*)    MCH 24.8 (*)    MCHC 29.7 (*)  Abs Immature Granulocytes 0.09 (*)    All other components within normal limits  COMPREHENSIVE METABOLIC PANEL - Abnormal; Notable for the following components:   Potassium 5.2 (*)    Glucose, Bld 340 (*)    Creatinine, Ser 1.68 (*)    AST 13 (*)    GFR, Estimated 47 (*)    All other components within normal limits  URINALYSIS, ROUTINE W REFLEX MICROSCOPIC  OCCULT BLOOD X 1 CARD TO LAB, STOOL  POC OCCULT BLOOD, ED    EKG None  Radiology No results found.  Procedures Procedures    Medications Ordered in ED Medications  sodium chloride 0.9 % bolus 500 mL (0 mLs Intravenous Stopped 01/17/23 1433)    ED Course/ Medical Decision Making/ A&P                                 Medical Decision Making Amount and/or Complexity of Data Reviewed Labs: ordered.   Malachy Vlasic is a 57 year old male who presents to the ED for evaluation.  Please see HPI for further detail.  Patient's office visit today is reviewed upon chart review. Upon patient, assessment patient is resting comfortably in bed with no complaints at this time stating that "I feel fine".  Orthostatic hypotension is noted but upon chart review patient has a diagnosis of "postural hypotension" and is likely due to recent increase of Lasix from daily to twice daily as instructed by cardiology. Low iron and iron saturation is noted upon chart review but he is being followed by cardiology and currently taking iron supplementation at home.  ED workup is significant for  hyperkalemia (5.2) and hyperglycemia (304) likely due to increased fluid loss from increasing Lasix from daily to twice daily 3 days prior and not significantly elevated from baseline.   Low suspicion for blood loss due to negative guaiac and improvement of hemoglobin from 8.7 on 01/11/23 to 10.3 on 01/17/23  14:26  Upon reassessment, patient is resting comfortably in bed.  He continues to have no physical complaints.  Patient is ambulated with NT and has no difficulty ambulating or complaints at this time.  Discussed assessment, treatment plan, disposition with attending Dr. Adela Lank who was amenable and agreeable to plan and disposition.   Informed him to follow-up with cardiology regarding iron supplementation and diuretic use in relation to orthostatic hypotension tomorrow.  Patient is to follow-up with PCP regarding routine maintenance and medication management. Discussed discharge with patient patient is extremely agreeable and expresses understanding with plan.           Final Clinical Impression(s) / ED Diagnoses Final diagnoses:  Orthostatic hypotension    Rx / DC Orders ED Discharge Orders     None         Judithann Sheen, PA 01/17/23 1442    Melene Plan, DO 01/17/23 1444

## 2023-01-17 NOTE — Telephone Encounter (Signed)
/  Patient came back to office after we transferred him by [facility] to [facility] ED. He stated they did not keep him as he was only dehydrated. He also stated he can not drive back for another follow up with [facility] as he has no income. Advised that when he can get a ride or afford to come to please try to come for one more set of labs and follow up after some time off the antibiotics that he just finished days ago. He agreed and will also call cardio regarding appt needed for iron infusions(they have been trying to reach him). Advised to keep 01/23/23 appt with post op surgeon and to also stay with [facility] cardio of .

## 2023-01-17 NOTE — ED Triage Notes (Signed)
Pt. Stated, I was sent over form [facility] for low BP and abnormal Labs not sure which one. I had a little toe removed on June 26

## 2023-01-17 NOTE — Progress Notes (Signed)
Patient Active Problem List   Diagnosis Date Noted   Melena 11/25/2022   Dark stools 11/25/2022   PUD (peptic ulcer disease) 11/25/2022   Symptomatic anemia 11/25/2022   Acute upper GI bleeding 11/23/2022   Chronic systolic CHF (congestive heart failure) (HCC) 11/23/2022   Sepsis (HCC) 11/11/2022   Gastric ulcer 11/06/2022   Duodenal ulcer 11/06/2022   [place] grade D esophagitis 11/06/2022   Abscess of left foot 11/01/2022   Osteomyelitis (HCC) 10/30/2022   Tobacco use 09/29/2022   Hypertension associated with diabetes (HCC) 09/29/2022   Hyperlipidemia associated with type 2 diabetes mellitus (HCC) 09/29/2022   Diabetes mellitus, type 2 (HCC) 09/29/2022   COPD (chronic obstructive pulmonary disease) (HCC) 09/29/2022   Atherosclerotic heart disease 09/07/2021   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/11/2021   Acute systolic heart failure (HCC) 06/06/2021   Abscess of perineum 07/17/2018   Type 2 diabetes mellitus with hyperglycemia (HCC) 07/09/2018   Nicotine dependence, unspecified, uncomplicated 03/18/2018   Male erectile dysfunction, unspecified 05/17/2017   Chest pain 12/20/2016   Postural hypotension 12/20/2016   Syncope 12/20/2016   Anemia, unspecified 12/14/2016    Patient's Medications  New Prescriptions   No medications on file  Previous Medications   ACETAMINOPHEN (TYLENOL) 325 MG TABLET    Take 2 tablets (650 mg total) by mouth every 6 (six) hours as needed for mild pain, fever or headache.   ASPIRIN EC 81 MG TABLET    Take 1 tablet (81 mg total) by mouth daily. Swallow whole.   CLOPIDOGREL (PLAVIX) 75 MG TABLET    Take 1 tablet (75 mg total) by mouth daily.   DAPAGLIFLOZIN PROPANEDIOL (FARXIGA) 10 MG TABS TABLET    Take 1 tablet (10 mg total) by mouth daily before breakfast.   DICLOFENAC (VOLTAREN) 75 MG EC TABLET    Take 75 mg by mouth 2 (two) times daily.   EZETIMIBE (ZETIA) 10 MG TABLET    Take 1 tablet (10 mg total) by mouth daily.   FERROUS SULFATE (FE TABS) 325  (65 FE) MG EC TABLET    Take 1 tablet (325 mg total) by mouth daily.   FUROSEMIDE (LASIX) 20 MG TABLET    Take 1 tablet (20 mg total) by mouth 2 (two) times daily.   GABAPENTIN (NEURONTIN) 600 MG TABLET    Take 600 mg by mouth 3 (three) times daily.   GEMFIBROZIL (LOPID) 600 MG TABLET    Take 600 mg by mouth 2 (two) times daily before a meal.   INSULIN ASPART (NOVOLOG) 100 UNIT/ML INJECTION    Inject 0-9 Units into the skin 3 (three) times daily with meals. CBG < 70: Implement Hypoglycemia Standing Orders CBG 70 - 120: 0 units CBG 121 - 150: 1 unit CBG 151 - 200: 2 units CBG 201 - 250: 3 units CBG 251 - 300: 5 units CBG 301 - 350: 7 units CBG 351 - 400: 9 units CBG > 400: call MD and obtain STAT lab verification   INSULIN GLARGINE (BASAGLAR KWIKPEN [place])    Inject 2-50 Units into the skin 2 (two) times daily.   INSULIN SYRINGE-NEEDLE U-100 30G X 1/2" 0.3 ML MISC    Use with Novolog   MELATONIN 10 MG TABS    Take 10 mg by mouth at bedtime.   METFORMIN (GLUCOPHAGE) 1000 MG TABLET    Take 1,000 mg by mouth 2 (two) times daily.   NICOTINE (NICODERM CQ - DOSED IN MG/24  HOURS) 21 MG/24HR PATCH    Place 1 patch (21 mg total) onto the skin daily.   NITROGLYCERIN (NITROSTAT) 0.4 MG SL TABLET    Place 1 tablet (0.4 mg total) under the tongue every 5 (five) minutes as needed for chest pain.   OXYCODONE (OXY IR/ROXICODONE) 5 MG IMMEDIATE RELEASE TABLET    Take 5 mg by mouth every 6 (six) hours as needed for breakthrough pain or moderate pain.   PANTOPRAZOLE (PROTONIX) 40 MG TABLET    Take 1 tablet (40 mg total) by mouth 2 (two) times daily.   POLYETHYLENE GLYCOL (MIRALAX / GLYCOLAX) 17 G PACKET    Take 17 g by mouth daily as needed for mild constipation.   POTASSIUM CHLORIDE SA (KLOR-CON M) 20 MEQ TABLET    Take 1 tablet (20 mEq total) by mouth daily.   SACUBITRIL-VALSARTAN (ENTRESTO) 24-26 MG    Take 1 tablet by mouth 2 (two) times daily.   SENNA-DOCUSATE (SENOKOT-S) 8.6-50 MG TABLET    Take 1 tablet by mouth 2  (two) times daily.   VARENICLINE TARTRATE, STARTER, (CHANTIX STARTING MONTH PAK) 0.5 MG X 11 & 1 MG X 42 TBPK    Take 0.5 mg by mouth as directed.  Modified Medications   No medications on file  Discontinued Medications   No medications on file    Subjective: 57 year old male with past medical history as below presents for hospital follow-up of fourth metatarsal osteomyelitis.  Patient underwent I&D with resection of fifth metatarsal head on 6/11 cultures growing MRSA.  He then received oritavancin on 6/15 transition to Doxy x 2 weeks to complete a total 2 weeks antibiotics from the OR.  He had undergone ortogram and tibioperoneal angioplasty due to critical limb ischemia with vascular surgery on 6/14.  Taken back to the OR on 6/19 as there was necrosis laterally to the foot again underwent I&D and washout left foot with cultures growing MRSA and E faecalis.  Discharged on Doxy and Augmentin x 6 weeks EOT 7/30.  Opted for a long course of antibiotics as there may be a vascular component hindering wound healing in his case. Since discharge she has been seen by podiatry, Dr. Annamary Rummage on 7/15.  Noted that left foot progressing as expected postoperatively.  Small area of central dehiscence that is superficial in left nature less than 1/2 centimeter.  Staples removed at the visit. 12/12/2022: Patient reports he feels well.  He reports that he just has about a week left of antibiotics.  Notes no issues at end of antibiotics.  Today 01/17/23: Pt reports he recently started Iron pills. Discussed low BP.  Review of Systems: Review of Systems  All other systems reviewed and are negative.   Past Medical History:  Diagnosis Date   COPD (chronic obstructive pulmonary disease) (HCC)    Diabetes mellitus, type 2 (HCC)    HLD (hyperlipidemia)    Hypertension    Tobacco abuse     Social History   Tobacco Use   Smoking status: Every Day    Current packs/day: 1.00    Types: Cigarettes    Passive  exposure: Never   Smokeless tobacco: Current   Tobacco comments:    Trying to quit with Chantix-12/2022  Vaping Use   Vaping status: Never Used  Substance Use Topics   Alcohol use: Yes    Comment: occasionally   Drug use: Never    Family History  Problem Relation Age of Onset   Hypertension Mother  Cancer Mother    Heart disease Father     Allergies  Allergen Reactions   Lisinopril     Leg swelling   Statins     Leg swelling    Health Maintenance  Topic Date Due   FOOT EXAM  Never done   OPHTHALMOLOGY EXAM  Never done   Diabetic kidney evaluation - Urine ACR  Never done   Hepatitis C Screening  Never done   Colonoscopy  Never done   Zoster Vaccines- Shingrix (1 of 2) Never done   COVID-19 Vaccine (1 - 2023-24 season) Never done   INFLUENZA VACCINE  12/21/2022   HEMOGLOBIN A1C  04/13/2023   Diabetic kidney evaluation - eGFR measurement  01/11/2024   DTaP/Tdap/Td (2 - Td or Tdap) 11/27/2029   HIV Screening  Completed   HPV VACCINES  Aged Out    Objective:  Vitals:   01/17/23 1008  Resp: 16  Height: 5\' 8"  (1.727 m)   Body mass index is 28.92 kg/m.  Physical Exam Constitutional:      General: He is not in acute distress.    Appearance: He is normal weight. He is not toxic-appearing.  HENT:     Head: Normocephalic and atraumatic.     Right Ear: External ear normal.     Left Ear: External ear normal.     Nose: No congestion or rhinorrhea.     Mouth/Throat:     Mouth: Mucous membranes are moist.     Pharynx: Oropharynx is clear.  Eyes:     Extraocular Movements: Extraocular movements intact.     Conjunctiva/sclera: Conjunctivae normal.     Pupils: Pupils are equal, round, and reactive to light.  Cardiovascular:     Rate and Rhythm: Normal rate and regular rhythm.     Heart sounds: No murmur heard.    No friction rub. No gallop.  Pulmonary:     Effort: Pulmonary effort is normal.     Breath sounds: Normal breath sounds.  Abdominal:     General:  Abdomen is flat. Bowel sounds are normal.     Palpations: Abdomen is soft.  Musculoskeletal:        General: No swelling. Normal range of motion.     Cervical back: Normal range of motion and neck supple.  Skin:    General: Skin is warm and dry.  Neurological:     General: No focal deficit present.     Mental Status: He is oriented to person, place, and time.  Psychiatric:        Mood and Affect: Mood normal.     Lab Results Lab Results  Component Value Date   WBC 13.6 (H) 01/11/2023   HGB 8.7 (L) 01/11/2023   HCT 28.9 (L) 01/11/2023   MCV 84.3 01/11/2023   PLT 265 01/11/2023    Lab Results  Component Value Date   CREATININE 1.79 (H) 01/11/2023   BUN 37 (H) 01/11/2023   NA 136 01/11/2023   K 4.5 01/11/2023   CL 102 01/11/2023   CO2 19 (L) 01/11/2023    Lab Results  Component Value Date   ALT 11 01/11/2023   AST 14 (L) 01/11/2023   ALKPHOS 113 01/11/2023   BILITOT 0.4 01/11/2023    Lab Results  Component Value Date   CHOL 148 11/04/2022   HDL 21 (L) 11/04/2022   LDLCALC 76 11/04/2022   LDLDIRECT 81 09/04/2022   TRIG 253 (H) 11/04/2022   CHOLHDL 7.0 11/04/2022   No  results found for: "LABRPR", "RPRTITER" No results found for: "HIV1RNAQUANT", "HIV1RNAVL", "CD4TABS"   Problem List Items Addressed This Visit   None  Assessment/Plan 57 year old male presents for management of fifth MTH osteomyelitis:  #Hypotension #Hx of UGIB #Low Iron, taking Fe pills -BP 70s/50s in clinic. Pt has dark stools but he is on Fe pills. Given Hx of GIB will send to ED for further work up.   #Fifth metatarsal osteomyelitis -Seen by Podiatry Dr. Gaye Pollack 7/15 noted to be progressing as expected -Plan to complete doxycycline and Augmentin x 6 weeks from the OR EOT 7/30.  I looked at his wound today, no signs of active infection from what I can see.  Appears to be healing. ESR /Crp 31/<3 on 12/12/22(stable) towards end of therapy -will plan to see pt back to do labs off of abx  pending management of acute issue above  #Elevated Scr 1.6 on 7/23->1.41 on repeat 8/12 -elevated again at 1.79 on 8/22->refer to nephrology  #leg swelling -referred to Cards by ED   Danelle Earthly, MD [facility] 01/17/2023, 10:09 AM   I have personally spent 35 minutes involved in face-to-face and non-face-to-face activities for this patient on the day of the visit. Professional time spent includes the following activities: Preparing to see the patient (review of tests), Obtaining and/or reviewing separately obtained history (admission/discharge record), Performing a medically appropriate examination and/or evaluation , Ordering medications/tests/procedures, referring and communicating with other health care professionals, Documenting clinical information in the EMR, Independently interpreting results (not separately reported), Communicating results to the patient/family/caregiver, Counseling and educating the patient/family/caregiver and Care coordination (not separately reported).

## 2023-01-17 NOTE — Discharge Instructions (Signed)
Informed him to follow-up with cardiology regarding iron supplementation and diuretic use in relation to orthostatic hypotension tomorrow.  Patient is to follow-up with PCP regarding routine maintenance and medication management

## 2023-01-18 ENCOUNTER — Telehealth: Payer: Self-pay | Admitting: Cardiology

## 2023-01-18 DIAGNOSIS — I5042 Chronic combined systolic (congestive) and diastolic (congestive) heart failure: Secondary | ICD-10-CM

## 2023-01-18 DIAGNOSIS — D649 Anemia, unspecified: Secondary | ICD-10-CM

## 2023-01-18 DIAGNOSIS — N1832 Chronic kidney disease, stage 3b: Secondary | ICD-10-CM

## 2023-01-18 NOTE — Telephone Encounter (Signed)
Patient is calling stating he does not know where to go to get the blood transfusion Dr. Dulce Sellar is wanting him to have for his low iron.  Please advise.

## 2023-01-18 NOTE — Telephone Encounter (Signed)
Advised that referral was sent to [facility] [phone] given to pt. Pt verbalized understanding and had no additional questions.

## 2023-01-18 NOTE — Addendum Note (Signed)
Addended by: Eleonore Chiquito on: 01/18/2023 03:25 PM   Modules accepted: Orders

## 2023-01-22 ENCOUNTER — Other Ambulatory Visit: Payer: Self-pay | Admitting: Hematology and Oncology

## 2023-01-22 DIAGNOSIS — D649 Anemia, unspecified: Secondary | ICD-10-CM

## 2023-01-22 NOTE — Progress Notes (Unsigned)
[facility] [address] [place],  [place]  [address] [phone]  Clinic Day:  01/23/2023  Referring physician: [name], MD   REASON FOR CONSULTATION:  Anemia  HISTORY OF PRESENT ILLNESS:  Kenneth Bailey is a 57 y.o. male with a history of anemia dating back to January 2023 who is referred in consultation by Dr. Norman Herrlich for assessment and management.   Medical history: Congestive heart failure, type II diabetes, hypertension, hyperlipidemia, coronary artery disease status post CABG.   January 2023:CTA chest-slightly enlarged lymph nodes in the mediastinum and bilateral hilar areas largest measuring 1.5.  Patchy groundglass densities in both perihilar regions and lower lung fields with small to moderate bilateral pleural effusions.  2.2 x 0.8 hypervascular structure in the anterior aspect of the body of the pancreas.  This may suggest vascular malformation or neoplastic process.  Follow-up with CT or MRI may be considered.  Mild wall thickening of the gallbladder was questionable cholecystic stranding.  May be physiologic or suggest acute or chronic inflammation.  MRI abdomen January 2023-no main duct dilatation or inflammation identified. No solid enhancing pancreas lesion identified. Corresponding to the recent CT abnormality is a focal area relative hypoenhancement measuring 2.2 x 1.0 cm, image 41/17. This does not have the typical appearance of the pancreatic neoplasm in may represent a thrombosed vessel  June 10-28 2024: Admitted to [facility] with sepsis due to osteomyelitis, acute kidney injury, hematemesis.  Creatinine 2.9.  Hydrated.  S/P debridement left foot ulcer, left 5th toe amputation.  June 15-HbG 7.  MCV 85.  Status post 2 units PRBC June 16-EGD-nonbleeding reflux esophagitis, nonbleeding gastric and duodenal ulcers.  Pathology-reactive gastropathy negative for H. pylori, intestinal metaplasia, dysplasia and carcinoma. June  17-HgB7.1.  Status post 2 units PRBC   July 4 -8 2024: Admitted to [facility] melena.  Hemoglobin 7.8.  Status post 1 unit PRBC  July 5-EGD-diffuse gastritis, non-bleeding duodenal ulcer with a nonbleeding visible vessel found in the bulb. Injected with epinephrine. Treated with bipolar cautery. Clips placed  January 11, 2023: [name], ED with bilateral lower extremity peripheral edema.  Placed on furosemide 20 mg daily and potassium chloride 20 meq daily.  Follow-up with cardiology recommended  January 17, 2023: [name] ED with orthostatic hypotension and hyperkalemia.  Hydrated.  Follow-up with cardiology recommended   September 1- 3 2024: Admitted to [facility] with acute kidney injury, hyperkalemia.  Creatinine 1.9.  Calcium 5.8.  Hydrated  September 3-HgB 9.6.  MCV 81.  Creatinine 1.2.  Potassium 4.1  January 23, 2023: Hematology consult  Discharged from [facility] today.  Reports fatigue and pica to ice.  Decreased appetite without weight loss.  Insomnia, medications effective. Denies melena or hematochezia.Takes clopidogrel 75 mg and aspirin 81 mg daily.  Denies of other aspirin containing products or ibuprofen.  Denies other overt form of blood loss.  Appointment with cardiologist on September 10.      Social history: He currently smokes a half a pack a day.  He smoked since age 75 at times up to 3 packs a day for at least 5 years.  He is divorced with no children and lives alone.  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   REVIEW OF SYSTEMS:  Review of Systems  Constitutional:  Positive for appetite change and fatigue. Negative for chills, fever and unexpected weight change.  HENT:   Negative for lump/mass, mouth sores, nosebleeds  and sore throat.   Respiratory:  Negative for cough and shortness of breath.   Cardiovascular:  Negative for chest pain and leg swelling.  Gastrointestinal:  Negative for abdominal distention, abdominal pain,  blood in stool, constipation, diarrhea, nausea and vomiting.  Genitourinary:  Negative for difficulty urinating, dysuria, frequency and hematuria.   Musculoskeletal:  Negative for arthralgias, back pain and myalgias.  Skin:  Negative for itching, rash and wound.  Neurological:  Negative for dizziness, extremity weakness, headaches, light-headedness and numbness.  Hematological:  Negative for adenopathy.  Psychiatric/Behavioral:  Positive for sleep disturbance. Negative for depression. The patient is not nervous/anxious.      VITALS:  Blood pressure 118/77, pulse 87, temperature 98.3 F (36.8 C), temperature source Oral, resp. rate 20, height 5\' 7"  (1.702 m), weight 181 lb 8 oz (82.3 kg), SpO2 100%.  Wt Readings from Last 3 Encounters:  01/23/23 181 lb 8 oz (82.3 kg)  01/17/23 182 lb 9.6 oz (82.8 kg)  01/12/23 190 lb 3.2 oz (86.3 kg)    Body mass index is 28.43 kg/m.  Performance status (ECOG): 2 - Symptomatic, <50% confined to bed  PHYSICAL EXAM:  Physical Exam Vitals and nursing note reviewed.  Constitutional:      General: He is not in acute distress.    Appearance: Normal appearance. He is normal weight. He is ill-appearing (Chronically ill-appearing).  HENT:     Head: Normocephalic and atraumatic.     Mouth/Throat:     Mouth: Mucous membranes are moist.     Pharynx: Oropharynx is clear. No oropharyngeal exudate or posterior oropharyngeal erythema.  Eyes:     General: No scleral icterus.    Extraocular Movements: Extraocular movements intact.     Conjunctiva/sclera: Conjunctivae normal.     Pupils: Pupils are equal, round, and reactive to light.  Cardiovascular:     Rate and Rhythm: Normal rate and regular rhythm.     Heart sounds: Normal heart sounds. No murmur heard.    No friction rub. No gallop.  Pulmonary:     Effort: Pulmonary effort is normal.     Breath sounds: Normal breath sounds. No wheezing, rhonchi or rales.  Abdominal:     General: Bowel sounds are  normal. There is no distension.     Palpations: Abdomen is soft. There is no hepatomegaly, splenomegaly or mass.     Tenderness: There is no abdominal tenderness.  Musculoskeletal:        General: Normal range of motion.     Cervical back: Normal range of motion and neck supple. No tenderness.     Right lower leg: No edema.     Left lower leg: No edema.  Lymphadenopathy:     Cervical: No cervical adenopathy.     Upper Body:     Right upper body: No supraclavicular or axillary adenopathy.     Left upper body: No supraclavicular or axillary adenopathy.     Lower Body: No right inguinal adenopathy. No left inguinal adenopathy.  Skin:    General: Skin is warm and dry.     Coloration: Skin is not jaundiced.     Findings: No rash.  Neurological:     Mental Status: He is alert and oriented to person, place, and time.     Cranial Nerves: No cranial nerve deficit.  Psychiatric:        Mood and Affect: Mood normal.        Behavior: Behavior normal.        Thought  Content: Thought content normal.      LABS:      Latest Ref Rng & Units 01/23/2023    2:47 PM 01/17/2023   10:59 AM 01/11/2023    2:58 AM  CBC  WBC 4.0 - 10.5 K/uL 9.6  8.0  13.6   Hemoglobin 13.0 - 17.0 g/dL 82.9  56.2  8.7   Hematocrit 39.0 - 52.0 % 33.9  34.7  28.9   Platelets 150 - 400 K/uL 372  395  265       Latest Ref Rng & Units 01/23/2023    2:47 PM 01/17/2023   10:59 AM 01/11/2023    2:58 AM  CMP  Glucose 70 - 99 mg/dL 130  865  784   BUN 6 - 20 mg/dL 26  19  37   Creatinine 0.61 - 1.24 mg/dL 6.96  2.95  2.84   Sodium 135 - 145 mmol/L 137  136  136   Potassium 3.5 - 5.1 mmol/L 4.4  5.2  4.5   Chloride 98 - 111 mmol/L 100  98  102   CO2 22 - 32 mmol/L 24  27  19    Calcium 8.9 - 10.3 mg/dL 13.2  9.6  8.7   Total Protein 6.5 - 8.1 g/dL 8.3  7.8  6.9   Total Bilirubin 0.3 - 1.2 mg/dL 0.4  0.5  0.4   Alkaline Phos 38 - 126 U/L 85  85  113   AST 15 - 41 U/L 13  13  14    ALT 0 - 44 U/L 12  10  11       No  results found for: "CEA1", "CEA" / No results found for: "CEA1", "CEA" No results found for: "PSA1" No results found for: "GMW102" No results found for: "CAN125"  No results found for: "TOTALPROTELP", "ALBUMINELP", "A1GS", "A2GS", "BETS", "BETA2SER", "GAMS", "MSPIKE", "SPEI" Lab Results  Component Value Date   TIBC 337 01/12/2023   TIBC 316 11/03/2022   FERRITIN 18 (L) 01/23/2023   FERRITIN 69 01/12/2023   FERRITIN 193 11/03/2022   IRONPCTSAT 5 (LL) 01/12/2023   IRONPCTSAT 5 (L) 11/03/2022   Lab Results  Component Value Date   LDH 113 01/23/2023    STUDIES:  DG Chest 2 View  Result Date: 01/11/2023 CLINICAL DATA:  57 year old male history of bilateral lower extremity swelling. Shortness of breath. EXAM: CHEST - 2 VIEW COMPARISON:  Chest x-ray 11/04/2022. FINDINGS: Lung volumes are normal. No consolidative airspace disease. No pleural effusions. No pneumothorax. No pulmonary nodule or mass noted. Pulmonary vasculature and the cardiomediastinal silhouette are within normal limits. Status post median sternotomy for CABG. IMPRESSION: 1. No radiographic evidence of acute cardiopulmonary disease. Electronically Signed   By: Trudie Reed M.D.   On: 01/11/2023 05:28      HISTORY:   Past Medical History:  Diagnosis Date   Acute combined systolic and diastolic heart failure (HCC)    Anemia    CHF (congestive heart failure) (HCC)    COPD (chronic obstructive pulmonary disease) (HCC)    Diabetes mellitus, type 2 (HCC)    Gastric ulcer    HLD (hyperlipidemia)    Hypertension    Kidney disease, chronic, stage III (GFR 30-59 ml/min) (HCC)    Multiple duodenal ulcers    Orthostatic hypotension    Osteomyelitis (HCC)    PUD (peptic ulcer disease)    Tobacco abuse     Past Surgical History:  Procedure Laterality Date   ABDOMINAL AORTOGRAM W/LOWER EXTREMITY N/A  11/03/2022   Procedure: ABDOMINAL AORTOGRAM W/LOWER EXTREMITY;  Surgeon: Cephus Shelling, MD;  Location: [facility] INVASIVE CV  LAB;  Service: Cardiovascular;  Laterality: N/A;   AMPUTATION Left 10/31/2022   Procedure: AMPUTATION LEFT FIFTH TOE;  Surgeon: Louann Sjogren, DPM;  Location: [facility] OR;  Service: Podiatry;  Laterality: Left;   AMPUTATION Left 11/15/2022   Procedure: AMPUTATION FITH RAY;  Surgeon: Felecia Shelling, DPM;  Location: [facility] OR;  Service: Podiatry;  Laterality: Left;   AMPUTATION TOE Left 10/31/2022   Procedure: IRRIGATION AND DEBRIDEMENTOF LEFT FOOT;  Surgeon: Louann Sjogren, DPM;  Location: [facility] OR;  Service: Podiatry;  Laterality: Left;   BIOPSY  11/05/2022   Procedure: BIOPSY;  Surgeon: Jeani Hawking, MD;  Location: [facility] ENDOSCOPY;  Service: Gastroenterology;;   CORONARY ARTERY BYPASS GRAFT N/A 06/10/2021   Procedure: CORONARY ARTERY BYPASS GRAFTING (CABG) TIMES FOUR, USING LEFT INTERNAL MAMMARY ARTERY AND RIGHT GREATER SAPHENOUS VEIN HARVESTED ENDOSCOPICALLY;  Surgeon: Lovett Sox, MD;  Location: [facility] OR;  Service: Open Heart Surgery;  Laterality: N/A;   ENDOVEIN HARVEST OF GREATER SAPHENOUS VEIN Right 06/10/2021   Procedure: ENDOVEIN HARVEST OF GREATER SAPHENOUS VEIN;  Surgeon: Lovett Sox, MD;  Location: [facility] OR;  Service: Open Heart Surgery;  Laterality: Right;   ESOPHAGOGASTRODUODENOSCOPY N/A 11/24/2022   Procedure: ESOPHAGOGASTRODUODENOSCOPY (EGD);  Surgeon: Lemar Lofty., MD;  Location: [facility] ENDOSCOPY;  Service: Gastroenterology;  Laterality: N/A;   ESOPHAGOGASTRODUODENOSCOPY (EGD) WITH PROPOFOL N/A 11/05/2022   Procedure: ESOPHAGOGASTRODUODENOSCOPY (EGD) WITH PROPOFOL;  Surgeon: Jeani Hawking, MD;  Location: [facility] ENDOSCOPY;  Service: Gastroenterology;  Laterality: N/A;   HEMOSTASIS CLIP PLACEMENT  11/24/2022   Procedure: HEMOSTASIS CLIP PLACEMENT;  Surgeon: Lemar Lofty., MD;  Location: [facility] ENDOSCOPY;  Service: Gastroenterology;;   HEMOSTASIS CONTROL  11/24/2022   Procedure: HEMOSTASIS CONTROL;  Surgeon: Lemar Lofty., MD;  Location: [facility] ENDOSCOPY;  Service: Gastroenterology;;   HOT  HEMOSTASIS N/A 11/24/2022   Procedure: HOT HEMOSTASIS (ARGON PLASMA COAGULATION/BICAP);  Surgeon: Lemar Lofty., MD;  Location: [facility] ENDOSCOPY;  Service: Gastroenterology;  Laterality: N/A;   I & D EXTREMITY Left 11/08/2022   Procedure: IRRIGATION AND DEBRIDEMENT OF FOOT AND WASHOUT;  Surgeon: Vivi Barrack, DPM;  Location: [facility] OR;  Service: Podiatry;  Laterality: Left;   PERIPHERAL VASCULAR BALLOON ANGIOPLASTY  11/03/2022   Procedure: PERIPHERAL VASCULAR BALLOON ANGIOPLASTY;  Surgeon: Cephus Shelling, MD;  Location: [facility] INVASIVE CV LAB;  Service: Cardiovascular;;   RIGHT/LEFT HEART CATH AND CORONARY ANGIOGRAPHY N/A 06/06/2021   Procedure: RIGHT/LEFT HEART CATH AND CORONARY ANGIOGRAPHY;  Surgeon: Dolores Patty, MD;  Location: [facility] INVASIVE CV LAB;  Service: Cardiovascular;  Laterality: N/A;   TEE WITHOUT CARDIOVERSION N/A 06/10/2021   Procedure: TRANSESOPHAGEAL ECHOCARDIOGRAM (TEE);  Surgeon: Lovett Sox, MD;  Location: [facility] OR;  Service: Open Heart Surgery;  Laterality: N/A;    Family History  Problem Relation Age of Onset   Hypertension Mother    Cancer Mother    Heart disease Father     Social History:  reports that he has been smoking cigarettes. He started smoking about 51 years ago. He has a 51 pack-year smoking history. He has never been exposed to tobacco smoke. He has never used smokeless tobacco. He reports current alcohol use. He reports that he does not use drugs.The patient is alone today.  Allergies:  Allergies  Allergen Reactions   Lisinopril     Leg swelling   Statins     Leg swelling    Current Medications: Current Outpatient Medications  Medication Sig Dispense Refill   SEMGLEE, YFGN, 100 UNIT/ML Pen Inject into the skin.     spironolactone (ALDACTONE) 25 MG tablet Take 25 mg by mouth once.     acetaminophen (TYLENOL) 325 MG tablet Take 2 tablets (650 mg total) by mouth every 6 (six) hours as needed for mild pain, fever or headache. 20 tablet 0    aspirin EC 81 MG tablet Take 1 tablet (81 mg total) by mouth daily. Swallow whole. 30 tablet 6   clopidogrel (PLAVIX) 75 MG tablet Take 1 tablet (75 mg total) by mouth daily. 30 tablet 0   dapagliflozin propanediol (FARXIGA) 10 MG TABS tablet Take 1 tablet (10 mg total) by mouth daily before breakfast. 30 tablet 6   diclofenac (VOLTAREN) 75 MG EC tablet Take 75 mg by mouth 2 (two) times daily.     ezetimibe (ZETIA) 10 MG tablet Take 1 tablet (10 mg total) by mouth daily. 30 tablet 0   ferrous sulfate (FE TABS) 325 (65 FE) MG EC tablet Take 1 tablet (325 mg total) by mouth daily. 90 tablet 3   furosemide (LASIX) 20 MG tablet Take 1 tablet (20 mg total) by mouth 2 (two) times daily. 180 tablet 0   gabapentin (NEURONTIN) 600 MG tablet Take 600 mg by mouth 3 (three) times daily. Patient reports taking twice daily     gemfibrozil (LOPID) 600 MG tablet Take 600 mg by mouth 2 (two) times daily before a meal.     insulin aspart (NOVOLOG) 100 UNIT/ML injection Inject 0-9 Units into the skin 3 (three) times daily with meals. CBG < 70: Implement Hypoglycemia Standing Orders CBG 70 - 120: 0 units CBG 121 - 150: 1 unit CBG 151 - 200: 2 units CBG 201 - 250: 3 units CBG 251 - 300: 5 units CBG 301 - 350: 7 units CBG 351 - 400: 9 units CBG > 400: call MD and obtain STAT lab verification 10 mL 0   Insulin Syringe-Needle U-100 30G X 1/2" 0.3 ML MISC Use with Novolog 90 each 0   Melatonin 10 MG TABS Take 10 mg by mouth at bedtime.     metFORMIN (GLUCOPHAGE) 1000 MG tablet Take 1,000 mg by mouth 2 (two) times daily.     mupirocin ointment (BACTROBAN) 2 % Apply 1 Application topically daily. 22 g 0   nicotine (NICODERM CQ - DOSED IN MG/24 HOURS) 21 mg/24hr patch Place 1 patch (21 mg total) onto the skin daily. 90 patch 3   nitroGLYCERIN (NITROSTAT) 0.4 MG SL tablet Place 1 tablet (0.4 mg total) under the tongue every 5 (five) minutes as needed for chest pain. 90 tablet 3   oxyCODONE (OXY IR/ROXICODONE) 5 MG immediate  release tablet Take 5 mg by mouth every 6 (six) hours as needed for breakthrough pain or moderate pain. (Patient not taking: Reported on 01/23/2023)     pantoprazole (PROTONIX) 40 MG tablet Take 1 tablet (40 mg total) by mouth 2 (two) times daily. 60 tablet 1   polyethylene glycol (MIRALAX / GLYCOLAX) 17 g packet Take 17 g by mouth daily as needed for mild constipation. 14 each 0   potassium chloride SA (KLOR-CON M) 20 MEQ tablet Take 1 tablet (20 mEq total) by mouth daily. 7 tablet 0   sacubitril-valsartan (ENTRESTO) 24-26 MG Take 1 tablet by mouth 2 (two) times daily. 180 tablet 3   senna-docusate (SENOKOT-S) 8.6-50 MG tablet Take 1 tablet by mouth 2 (two) times daily. 30 tablet 0   Varenicline  Tartrate, Starter, (CHANTIX STARTING MONTH PAK) 0.5 MG X 11 & 1 MG X 42 TBPK Take 0.5 mg by mouth as directed. 1 each 0   No current facility-administered medications for this visit.     ASSESSMENT & PLAN:   Assessment/Plan:  Kenneth Bailey is a 57 y.o. male with anemia  Normochromic normocytic anemia  Possible causes include nutritional or trace mineral deficiency, blood loss, hemolysis, anemia of chronic disease, plasma cell or stem cell disorders  Will evaluate with ferritin, B12, folate, copper, zinc, haptoglobin Coombs test, serum protein electrophoresis and serum light chains  Will have him follow-up with Dr. Bonner Puna off in 2 weeks for further recommendations.  Tobacco Abuse CTA chest in January 2023 did not reveal any evidence of lung malignancy  Difficult to quantify exact pack years of smoking, but greater than 20, so meets criteria screening low-dose CT chest-will discuss at later visit   I discussed the assessment and plan with the patient.  The patient was provided an opportunity to ask questions and all were answered.  The patient agreed with the plan and demonstrated an understanding of the instructions. I will have him see Dr. Angelene Giovanni in 2 weeks for further recommendations.    Thank you for the referral.   90  minutes was spent in patient care.  This included time spent preparing to see the patient (e.g., review of tests), obtaining and/or reviewing separately obtained history, counseling and educating the patient/family/caregiver, ordering medications, tests, or procedures; documenting clinical information in the electronic or other health record, independently interpreting results and communicating results to the patient/family/caregiver as well as coordination of care.      Adah Perl, PA-C   Physician Assistant [facility] [phone]

## 2023-01-23 ENCOUNTER — Inpatient Hospital Stay: Payer: Medicaid Other | Attending: Hematology and Oncology | Admitting: Hematology and Oncology

## 2023-01-23 ENCOUNTER — Ambulatory Visit (INDEPENDENT_AMBULATORY_CARE_PROVIDER_SITE_OTHER): Payer: Medicaid Other | Admitting: Podiatry

## 2023-01-23 ENCOUNTER — Encounter: Payer: Self-pay | Admitting: Hematology and Oncology

## 2023-01-23 ENCOUNTER — Inpatient Hospital Stay: Payer: Medicaid Other

## 2023-01-23 VITALS — BP 118/77 | HR 87 | Temp 98.3°F | Resp 20 | Ht 67.0 in | Wt 181.5 lb

## 2023-01-23 DIAGNOSIS — I10 Essential (primary) hypertension: Secondary | ICD-10-CM

## 2023-01-23 DIAGNOSIS — K922 Gastrointestinal hemorrhage, unspecified: Secondary | ICD-10-CM | POA: Insufficient documentation

## 2023-01-23 DIAGNOSIS — Z9889 Other specified postprocedural states: Secondary | ICD-10-CM

## 2023-01-23 DIAGNOSIS — D649 Anemia, unspecified: Secondary | ICD-10-CM

## 2023-01-23 DIAGNOSIS — D500 Iron deficiency anemia secondary to blood loss (chronic): Secondary | ICD-10-CM | POA: Diagnosis present

## 2023-01-23 DIAGNOSIS — N183 Chronic kidney disease, stage 3 unspecified: Secondary | ICD-10-CM | POA: Insufficient documentation

## 2023-01-23 DIAGNOSIS — M86672 Other chronic osteomyelitis, left ankle and foot: Secondary | ICD-10-CM

## 2023-01-23 DIAGNOSIS — F17210 Nicotine dependence, cigarettes, uncomplicated: Secondary | ICD-10-CM

## 2023-01-23 DIAGNOSIS — L97522 Non-pressure chronic ulcer of other part of left foot with fat layer exposed: Secondary | ICD-10-CM

## 2023-01-23 DIAGNOSIS — E119 Type 2 diabetes mellitus without complications: Secondary | ICD-10-CM

## 2023-01-23 LAB — CMP (CANCER CENTER ONLY)
ALT: 12 U/L (ref 0–44)
AST: 13 U/L — ABNORMAL LOW (ref 15–41)
Albumin: 3.9 g/dL (ref 3.5–5.0)
Alkaline Phosphatase: 85 U/L (ref 38–126)
Anion gap: 13 (ref 5–15)
BUN: 26 mg/dL — ABNORMAL HIGH (ref 6–20)
CO2: 24 mmol/L (ref 22–32)
Calcium: 10.4 mg/dL — ABNORMAL HIGH (ref 8.9–10.3)
Chloride: 100 mmol/L (ref 98–111)
Creatinine: 1.54 mg/dL — ABNORMAL HIGH (ref 0.61–1.24)
GFR, Estimated: 53 mL/min — ABNORMAL LOW (ref >60)
Glucose, Bld: 162 mg/dL — ABNORMAL HIGH (ref 70–99)
Potassium: 4.4 mmol/L (ref 3.5–5.1)
Sodium: 137 mmol/L (ref 135–145)
Total Bilirubin: 0.4 mg/dL (ref 0.3–1.2)
Total Protein: 8.3 g/dL — ABNORMAL HIGH (ref 6.5–8.1)

## 2023-01-23 LAB — RETICULOCYTES
Immature Retic Fract: 27.5 % — ABNORMAL HIGH (ref 2.3–15.9)
RBC.: 3.96 MIL/uL — ABNORMAL LOW (ref 4.22–5.81)
Retic Count, Absolute: 104.1 10*3/uL (ref 19.0–186.0)
Retic Ct Pct: 2.6 % (ref 0.4–3.1)

## 2023-01-23 LAB — FERRITIN: Ferritin: 18 ng/mL — ABNORMAL LOW (ref 24–336)

## 2023-01-23 LAB — CBC WITH DIFFERENTIAL (CANCER CENTER ONLY)
Abs Immature Granulocytes: 0.08 10*3/uL — ABNORMAL HIGH (ref 0.00–0.07)
Basophils Absolute: 0.1 10*3/uL (ref 0.0–0.1)
Basophils Relative: 1 %
Eosinophils Absolute: 0.3 10*3/uL (ref 0.0–0.5)
Eosinophils Relative: 3 %
HCT: 33.9 % — ABNORMAL LOW (ref 39.0–52.0)
Hemoglobin: 10.3 g/dL — ABNORMAL LOW (ref 13.0–17.0)
Immature Granulocytes: 1 %
Lymphocytes Relative: 13 %
Lymphs Abs: 1.2 10*3/uL (ref 0.7–4.0)
MCH: 26.1 pg (ref 26.0–34.0)
MCHC: 30.4 g/dL (ref 30.0–36.0)
MCV: 85.8 fL (ref 80.0–100.0)
Monocytes Absolute: 0.5 10*3/uL (ref 0.1–1.0)
Monocytes Relative: 5 %
Neutro Abs: 7.4 10*3/uL (ref 1.7–7.7)
Neutrophils Relative %: 77 %
Platelet Count: 372 10*3/uL (ref 150–400)
RBC: 3.95 MIL/uL — ABNORMAL LOW (ref 4.22–5.81)
RDW: 15.9 % — ABNORMAL HIGH (ref 11.5–15.5)
WBC Count: 9.6 10*3/uL (ref 4.0–10.5)
nRBC: 0 % (ref 0.0–0.2)

## 2023-01-23 LAB — TECHNOLOGIST SMEAR REVIEW: Plt Morphology: ADEQUATE

## 2023-01-23 LAB — VITAMIN B12: Vitamin B-12: 274 pg/mL (ref 180–914)

## 2023-01-23 LAB — DIRECT ANTIGLOBULIN TEST (NOT AT ARMC)
DAT, IgG: NEGATIVE
DAT, complement: NEGATIVE

## 2023-01-23 LAB — FOLATE: Folate: 8.7 ng/mL (ref >5.9)

## 2023-01-23 LAB — LACTATE DEHYDROGENASE: LDH: 113 U/L (ref 98–192)

## 2023-01-23 MED ORDER — MUPIROCIN 2 % EX OINT: 1.0000 | TOPICAL_OINTMENT | Freq: Every day | CUTANEOUS | 0 refills | Status: DC

## 2023-01-23 NOTE — Progress Notes (Signed)
  Subjective:  Patient ID: Kenneth Bailey, male    DOB: 1966/01/18,  MRN: [id number]  Chief Complaint  Patient presents with   Routine Post Op    DOS 11/15/2022 POV#5 Amputation Fith Ray L    DOS: 11/15/2022 Procedure: Partial fifth ray amputation left foot initially performed by Dr. Ralene Cork and then completed by Dr. Loreta Ave and revision surgery completed by Dr. Logan Bores with total amputation of the fifth ray.  57 y.o. male returns for post-op check.  He is now 8  weeks postop on his fifth ray amputation closure.  Patient reports he has had some pain in the left foot near the amputation site.  He reports mild drainage from the wound present at the lateral aspect of the left foot.  Says he has been putting a salve on the foot.  Review of Systems: Negative except as noted in the HPI. Denies N/V/F/Ch.   Objective:  There were no vitals filed for this visit. There is no height or weight on file to calculate BMI. Constitutional Well developed. Well nourished.  Vascular Foot warm and well perfused. Capillary refill normal to all digits.  Calf is soft and supple, no posterior calf or knee pain, negative Homans' sign  Neurologic Normal speech. Oriented to person, place, and time. Epicritic sensation to light touch grossly present bilaterally.  Dermatologic Skin healing well without signs of infection.  Superficial ulceration of the lateral aspect of the left midfoot with breakdown to subcutaneous fat level.  Upon debridement of overlying eschar there is wound that probes to subcu fat level.  Wound measures 2 x 0.2 x 0.2 cm postdebridement no significant erythema maceration or odor present from the wound.     Orthopedic: Tenderness to palpation noted about the surgical site.   Multiple view plain film radiographs: Deferred at this visit Assessment:   1. Ulcer of left foot with fat layer exposed (HCC)   2. 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 Post-operative state        Plan:  Patient was evaluated and treated and all questions answered.  S/p foot surgery left -Progressing as expected post-operatively.  Do believe patient is smoking which is delaying his healing.    Recommend smoking cessation otherwise wound may not heal appropriately -XR: Deferred -WB Status: WBAT in regular shoe gear -Medications: No indication for abx currently if completed prior course per ID recs -Overall the patient has very superficial dehiscence of the prior fifth ray amputation surgical site.  I debrided wound at this visit.  Probes subcutaneous fat tissue. -Continue home dressing changes  3 times weekly with mupirocin ointment and dry gauze dressing   Procedure: Excisional Debridement of Wound Rationale: Removal of non-viable soft tissue from the wound to promote healing.  Anesthesia: none Post-Debridement Wound Measurements: 2 cm x 0.2 cm x 0.2 cm  Type of Debridement: Sharp Excisional Tissue Removed: Non-viable soft tissue Depth of Debridement: subcutaneous tissue. Technique: Sharp excisional debridement to bleeding, viable wound base.  Dressing: Dry, sterile, compression dressing. Disposition: Patient tolerated procedure well.       Return in about 4 weeks (around 02/20/2023).         [name], DPM [facility]

## 2023-01-24 ENCOUNTER — Encounter: Payer: Self-pay | Admitting: Hematology and Oncology

## 2023-01-24 ENCOUNTER — Telehealth: Payer: Self-pay

## 2023-01-24 ENCOUNTER — Telehealth: Payer: Self-pay | Admitting: Hematology and Oncology

## 2023-01-24 DIAGNOSIS — D500 Iron deficiency anemia secondary to blood loss (chronic): Secondary | ICD-10-CM | POA: Insufficient documentation

## 2023-01-24 LAB — KAPPA/LAMBDA LIGHT CHAINS
Kappa free light chain: 47.8 mg/L — ABNORMAL HIGH (ref 3.3–19.4)
Kappa, lambda light chain ratio: 2.2 — ABNORMAL HIGH (ref 0.26–1.65)
Lambda free light chains: 21.7 mg/L (ref 5.7–26.3)

## 2023-01-24 LAB — HAPTOGLOBIN: Haptoglobin: 326 mg/dL (ref 29–370)

## 2023-01-24 NOTE — Telephone Encounter (Signed)
-----   Message from Adah Perl sent at 01/24/2023 10:46 AM EDT ----- Please let him know he is low on iron so we will give him IV iron. Schedulers will call to schedule. Thanks

## 2023-01-24 NOTE — Telephone Encounter (Signed)
Detailed message left for patient.

## 2023-01-24 NOTE — Telephone Encounter (Signed)
Contacted pt to schedule an appt. Unable to reach via phone, voicemail was left.  Scheduling Message Entered by Belva Crome A on 01/23/2023 at  6:31 PM Priority: Routine <No visit type provided>  Department: [facility]  Provider:  Scheduling Notes:  Labs and f/u with Dr. Angelene Giovanni in 2 weeks      Scheduling Message Entered by Belva Crome A on 01/24/2023 at 10:45 AM Priority: High <No visit type provided>  Department: [facility]  Provider:  Scheduling Notes:  Please schedule feraheme x 2 next available.

## 2023-01-25 ENCOUNTER — Encounter: Payer: Self-pay | Admitting: Hematology and Oncology

## 2023-01-25 LAB — COPPER, SERUM: Copper: 128 ug/dL (ref 69–132)

## 2023-01-25 LAB — ZINC: Zinc: 73 ug/dL (ref 44–115)

## 2023-01-26 LAB — MULTIPLE MYELOMA PANEL, SERUM
Albumin SerPl Elph-Mcnc: 3.8 g/dL (ref 2.9–4.4)
Albumin/Glob SerPl: 1.1 (ref 0.7–1.7)
Alpha 1: 0.3 g/dL (ref 0.0–0.4)
Alpha2 Glob SerPl Elph-Mcnc: 1.3 g/dL — ABNORMAL HIGH (ref 0.4–1.0)
B-Globulin SerPl Elph-Mcnc: 1.2 g/dL (ref 0.7–1.3)
Gamma Glob SerPl Elph-Mcnc: 0.8 g/dL (ref 0.4–1.8)
Globulin, Total: 3.5 g/dL (ref 2.2–3.9)
IgA: 172 mg/dL (ref 90–386)
IgG (Immunoglobin G), Serum: 903 mg/dL (ref 603–1613)
IgM (Immunoglobulin M), Srm: 63 mg/dL (ref 20–172)
Total Protein ELP: 7.3 g/dL (ref 6.0–8.5)

## 2023-01-29 MED FILL — Ferumoxytol Inj 510 MG/17ML (30 MG/ML) (Elemental Fe): INTRAVENOUS | Qty: 17 | Status: AC

## 2023-01-29 NOTE — Progress Notes (Unsigned)
Cardiology Office Note:  .   Date:  01/29/2023  ID:  Kenneth Bailey, DOB 23-Sep-1965, MRN [id number] PCP: Kenneth Sato, MD  [facility] Providers Cardiologist:  None { Click to update primary MD,subspecialty MD or APP then REFRESH:1}   History of Present Illness: .   Kenneth Bailey is a 57 y.o. male with a past medical history of systolic heart failure, coronary artery disease s/p CABG x 3 in 2021, COPD, PUD, DM2, dyslipidemia, upper GI bleed, nicotine dependence.  Evaluated by Dr. Dulce Bailey on 01/12/2023 after being evaluated in the emergency department the previous day for edema, hemoglobin noted to be 8.7, WBCs 13.6, creatinine 1.79.  He was also started on Chantix to help with smoking cessation  He was evaluated in the emergency department on 01/17/2023 for orthostatic hypotension, his Lasix had been recently changed to twice daily felt this was the culprit for his hypotension.  START: Entresto 24-26 two times daily START: Iron 325 mg daily START: Nicotine patch 21 mg daily START: Chantix starter pack 0.5 mg as directed START: Furosemide 20 mg twice daily  ROS: ROS   Studies Reviewed: .        Cardiac Studies & Procedures   CARDIAC CATHETERIZATION  CARDIAC CATHETERIZATION 06/06/2021  Narrative   Ost LAD to Prox LAD lesion is 90% stenosed.   Ramus lesion is 90% stenosed.   Prox RCA lesion is 95% stenosed.   Prox RCA to Mid RCA lesion is 80% stenosed.   Dist RCA lesion is 50% stenosed.   RPDA lesion is 90% stenosed.   Mid LM to Dist LM lesion is 50% stenosed.   Ost Cx to Prox Cx lesion is 40% stenosed.   1st Mrg lesion is 99% stenosed.   Mid Cx lesion is 50% stenosed.   2nd Diag lesion is 90% stenosed.   Mid LAD lesion is 70% stenosed.   Dist LAD lesion is 50% stenosed.   The left ventricular ejection fraction is 25-35% by visual estimate.  Findings:  Ao = 99/62 (84) LV = 107/11 RA =  5 RV = 30/6 PA = 29/9 (19) PCW = 12 Fick cardiac output/index =  4.7/2.5 PVR = 1.5 WU FA sat = 97% PA sat = 69%, 71% SVC sat = 69%  Assessment: Severe 3v CAD Ischemic CM EF ~25-30% Well compensated filling pressures  Plan/Discussion:  Will need CABG evaluation.  Kenneth Meres, MD 1:11 PM  Findings Coronary Findings Diagnostic  Dominance: Right  Left Main Mid LM to Dist LM lesion is 50% stenosed.  Left Anterior Descending Ost LAD to Prox LAD lesion is 90% stenosed. Mid LAD lesion is 70% stenosed. Dist LAD lesion is 50% stenosed.  Second Diagonal Branch 2nd Diag lesion is 90% stenosed.  Ramus Intermedius Ramus lesion is 90% stenosed.  Left Circumflex Ost Cx to Prox Cx lesion is 40% stenosed. Mid Cx lesion is 50% stenosed.  First Obtuse Marginal Branch 1st Mrg lesion is 99% stenosed.  Right Coronary Artery Prox RCA lesion is 95% stenosed. Prox RCA to Mid RCA lesion is 80% stenosed. Dist RCA lesion is 50% stenosed.  Right Posterior Descending Artery RPDA lesion is 90% stenosed.  Intervention  No interventions have been documented.     ECHOCARDIOGRAM  ECHOCARDIOGRAM COMPLETE 09/12/2022  Narrative ECHOCARDIOGRAM REPORT    Patient Name:   Kenneth Bailey [facility] Date of Exam: 09/12/2022 Medical Rec #:  [id number]           Height:  68.0 in Accession #:    [id number]          Weight:       167.8 lb Date of Birth:  Oct 13, 1965            BSA:          1.897 m Patient Age:    56 years            BP:           110/70 mmHg Patient Gender: M                   HR:           92 bpm. Exam Location:  [facility]  Procedure: 2D Echo, Cardiac Doppler, Color Doppler and Strain Analysis  Indications:    Chronic systolic heart failure [I50.22 (ICD-10-CM)]  History:        Patient has prior history of Echocardiogram examinations, most recent 09/21/2021. CHF, CAD, Prior CABG, COPD; Risk Factors:Hypertension, Dyslipidemia and Current Smoker.  Sonographer:    Margreta Journey RDCS Referring Phys: [id number] DANIEL R  BENSIMHON  IMPRESSIONS   1. Left ventricular ejection fraction, by estimation, is 45 to 50%. The left ventricle has mildly decreased function. The left ventricle has no regional wall motion abnormalities. Left ventricular diastolic parameters are consistent with Grade I diastolic dysfunction (impaired relaxation). The average left ventricular global longitudinal strain is -17.7 %. The global longitudinal strain is abnormal. 2. TAPSE 1.5. Right ventricular systolic function is mildly reduced. The right ventricular size is normal. 3. The mitral valve is normal in structure. No evidence of mitral valve regurgitation. No evidence of mitral stenosis. 4. The aortic valve is tricuspid. Aortic valve regurgitation is not visualized. No aortic stenosis is present. 5. Aortic Normal DTA. 6. The inferior vena cava is normal in size with greater than 50% respiratory variability, suggesting right atrial pressure of 3 mmHg.  FINDINGS Left Ventricle: Left ventricular ejection fraction, by estimation, is 45 to 50%. The left ventricle has mildly decreased function. The left ventricle has no regional wall motion abnormalities. The average left ventricular global longitudinal strain is -17.7 %. The global longitudinal strain is abnormal. The left ventricular internal cavity size was normal in size. There is borderline left ventricular hypertrophy. Left ventricular diastolic parameters are consistent with Grade I diastolic dysfunction (impaired relaxation). Normal left ventricular filling pressure.  Right Ventricle: TAPSE 1.5. The right ventricular size is normal. No increase in right ventricular wall thickness. Right ventricular systolic function is mildly reduced.  Left Atrium: Left atrial size was normal in size.  Right Atrium: Right atrial size was normal in size.  Pericardium: There is no evidence of pericardial effusion.  Mitral Valve: The mitral valve is normal in structure. No evidence of mitral valve  regurgitation. No evidence of mitral valve stenosis.  Tricuspid Valve: The tricuspid valve is normal in structure. Tricuspid valve regurgitation is not demonstrated. No evidence of tricuspid stenosis.  Aortic Valve: The aortic valve is tricuspid. Aortic valve regurgitation is not visualized. No aortic stenosis is present.  Pulmonic Valve: The pulmonic valve was normal in structure. Pulmonic valve regurgitation is not visualized. No evidence of pulmonic stenosis.  Aorta: The aortic root and ascending aorta are structurally normal, with no evidence of dilitation, Normal DTA and the aortic arch was not well visualized.  Venous: A normal flow pattern is recorded from the right upper pulmonary vein. The inferior vena cava is normal in size with greater than 50% respiratory variability,  suggesting right atrial pressure of 3 mmHg.  IAS/Shunts: No atrial level shunt detected by color flow Doppler.   LEFT VENTRICLE PLAX 2D LVIDd:         5.00 cm   Diastology LVIDs:         3.90 cm   LV e' medial:    7.22 cm/s LV PW:         1.10 cm   LV E/e' medial:  9.6 LV IVS:        1.00 cm   LV e' lateral:   10.63 cm/s LVOT diam:     1.70 cm   LV E/e' lateral: 6.5 LVOT Area:     2.27 cm 2D Longitudinal Strain 2D Strain GLS Avg:     -17.7 %  RIGHT VENTRICLE             IVC RV Basal diam:  2.60 cm     IVC diam: 1.10 cm RV Mid diam:    2.40 cm RV S prime:     10.43 cm/s TAPSE (M-mode): 1.5 cm  LEFT ATRIUM             Index        RIGHT ATRIUM           Index LA diam:        2.10 cm 1.11 cm/m   RA Area:     11.60 cm LA Vol (A2C):   48.8 ml 25.73 ml/m  RA Volume:   23.20 ml  12.23 ml/m LA Vol (A4C):   41.7 ml 21.98 ml/m LA Biplane Vol: 46.2 ml 24.36 ml/m  AORTA Ao Root diam: 3.30 cm Ao Asc diam:  3.50 cm Ao Desc diam: 1.90 cm  MITRAL VALVE MV Area (PHT): 4.25 cm     SHUNTS MV Decel Time: 179 msec     Systemic Diam: 1.70 cm MV E velocity: 69.53 cm/s MV A velocity: 111.00 cm/s MV E/A  ratio:  0.63  Norman Herrlich MD Electronically signed by Norman Herrlich MD Signature Date/Time: 09/12/2022/8:31:30 PM    Final   TEE  ECHO INTRAOPERATIVE TEE 06/10/2021  Narrative *INTRAOPERATIVE TRANSESOPHAGEAL REPORT *    Patient Name:   LORIMER TAKASHIMA [facility] Date of Exam: 06/10/2021 Medical Rec #:  [id number]           Height:       68.0 in Accession #:    [id number]          Weight:       166.0 lb Date of Birth:  05/03/1966            BSA:          1.89 m Patient Age:    55 years            BP:           143/88 mmHg Patient Gender: M                   HR:           72 bpm. Exam Location:  Anesthesiology  Transesophogeal exam was perform intraoperatively during surgical procedure. Patient was closely monitored under general anesthesia during the entirety of examination.  Indications:     Coronary Artery Disease Sonographer:     Eulah Pont RDCS Performing Phys: [id number] PETER VANTRIGT Diagnosing Phys: [name] MD  Complications: No known complications during this procedure. POST-OP IMPRESSIONS _ Left Ventricle: The left ventricle is unchanged from pre-bypass. _ Right Ventricle:  The right ventricle appears unchanged from pre-bypass. _ Aorta: The aorta appears unchanged from pre-bypass. _ Left Atrial Appendage: The left atrial appendage appears unchanged from pre-bypass. _ Aortic Valve: The aortic valve appears unchanged from pre-bypass. _ Mitral Valve: The mitral valve appears unchanged from pre-bypass. _ Tricuspid Valve: The tricuspid valve appears unchanged from pre-bypass. _ Pulmonic Valve: The pulmonic valve appears unchanged from pre-bypass.  PRE-OP FINDINGS Left Ventricle: The left ventricle has severely reduced systolic function, with an ejection fraction of 20-30%. The cavity size was moderately dilated.   Right Ventricle: The right ventricle has normal systolic function. The cavity was normal. There is no increase in right ventricular wall thickness.  Left  Atrium: Left atrial size was normal in size. No left atrial/left atrial appendage thrombus was detected.  Right Atrium: Right atrial size was normal in size. Moble, filamentous structure noted arrising from IVC and into Right atrium. Discussed wtih Dr. Maren Beach.  Interatrial Septum: Evidence of atrial level shunting detected by color flow Doppler. A small patent foramen ovale is detected with predominantly left to right shunting across the atrial septum.  Pericardium: There is no evidence of pericardial effusion.  Mitral Valve: The mitral valve is normal in structure. Mitral valve regurgitation is trivial by color flow Doppler. There is no evidence of mitral valve vegetation.  Tricuspid Valve: The tricuspid valve was normal in structure. Tricuspid valve regurgitation is trivial by color flow Doppler.  Aortic Valve: The aortic valve is tricuspid Aortic valve regurgitation was not visualized by color flow Doppler. There is no stenosis of the aortic valve. There is no evidence of aortic valve vegetation.   Pulmonic Valve: The pulmonic valve was normal in structure. Pulmonic valve regurgitation is trivial by color flow Doppler.   Aorta: There is evidence of plaque in the descending aorta; Grade II, measuring 2-79mm in size.  +-------------+--------++ AORTIC VALVE          +-------------+--------++ AV Mean Grad:6.0 mmHg +-------------+--------++   [name] MD Electronically signed by [name] MD Signature Date/Time: 06/10/2021/3:40:03 PM    Final            Risk Assessment/Calculations:   {Does this patient have ATRIAL FIBRILLATION?:[phone]} No BP recorded.  {Refresh Note OR Click here to enter BP  :1}***       Physical Exam:   VS:  There were no vitals taken for this visit.   Wt Readings from Last 3 Encounters:  01/23/23 181 lb 8 oz (82.3 kg)  01/17/23 182 lb 9.6 oz (82.8 kg)  01/12/23 190 lb 3.2 oz (86.3 kg)    GEN: Well nourished, well developed in  no acute distress NECK: No JVD; No carotid bruits CARDIAC: ***RRR, no murmurs, rubs, gallops RESPIRATORY:  Clear to auscultation without rales, wheezing or rhonchi  ABDOMEN: Soft, non-tender, non-distended EXTREMITIES:  No edema; No deformity   ASSESSMENT AND PLAN: .   ***    {Are you ordering a CV Procedure (e.g. stress test, cath, DCCV, TEE, etc)?   Press F2        :[id number]}  Dispo: ***  Signed, Flossie Dibble, NP

## 2023-01-30 ENCOUNTER — Ambulatory Visit: Payer: Medicaid Other | Attending: Cardiology | Admitting: Cardiology

## 2023-01-30 ENCOUNTER — Encounter: Payer: Self-pay | Admitting: Cardiology

## 2023-01-30 ENCOUNTER — Inpatient Hospital Stay: Payer: Medicaid Other

## 2023-01-30 VITALS — BP 122/72 | HR 89 | Temp 97.8°F | Resp 20 | Ht 68.0 in | Wt 179.0 lb

## 2023-01-30 VITALS — BP 112/68 | HR 92 | Ht 68.0 in | Wt 180.4 lb

## 2023-01-30 DIAGNOSIS — I2510 Atherosclerotic heart disease of native coronary artery without angina pectoris: Secondary | ICD-10-CM

## 2023-01-30 DIAGNOSIS — F1510 Other stimulant abuse, uncomplicated: Secondary | ICD-10-CM

## 2023-01-30 DIAGNOSIS — I5042 Chronic combined systolic (congestive) and diastolic (congestive) heart failure: Secondary | ICD-10-CM

## 2023-01-30 DIAGNOSIS — N1832 Chronic kidney disease, stage 3b: Secondary | ICD-10-CM

## 2023-01-30 DIAGNOSIS — I255 Ischemic cardiomyopathy: Secondary | ICD-10-CM

## 2023-01-30 DIAGNOSIS — E782 Mixed hyperlipidemia: Secondary | ICD-10-CM | POA: Diagnosis not present

## 2023-01-30 DIAGNOSIS — D500 Iron deficiency anemia secondary to blood loss (chronic): Secondary | ICD-10-CM | POA: Diagnosis not present

## 2023-01-30 DIAGNOSIS — N179 Acute kidney failure, unspecified: Secondary | ICD-10-CM

## 2023-01-30 MED ORDER — SODIUM CHLORIDE 0.9 % IV SOLN
510.0000 mg | Freq: Once | INTRAVENOUS | Status: AC
  Administered 2023-01-30: 510 mg via INTRAVENOUS
  Filled 2023-01-30: qty 510

## 2023-01-30 MED ORDER — LORATADINE 10 MG PO TABS
10.0000 mg | ORAL_TABLET | Freq: Once | ORAL | Status: AC
  Administered 2023-01-30: 10 mg via ORAL
  Filled 2023-01-30: qty 1

## 2023-01-30 MED ORDER — ACETAMINOPHEN 325 MG PO TABS
650.0000 mg | ORAL_TABLET | Freq: Once | ORAL | Status: AC
  Administered 2023-01-30: 650 mg via ORAL
  Filled 2023-01-30: qty 2

## 2023-01-30 MED ORDER — SODIUM CHLORIDE 0.9 % IV SOLN: Freq: Once | INTRAVENOUS | Status: AC

## 2023-01-30 NOTE — Patient Instructions (Signed)
Medication Instructions:  Your physician recommends that you continue on your current medications as directed. Please refer to the Current Medication list given to you today.  *If you need a refill on your cardiac medications before your next appointment, please call your pharmacy*   Lab Work: Your physician recommends that you return for lab work in: today for BMP  If you have labs (blood work) drawn today and your tests are completely normal, you will receive your results only by: MyChart Message (if you have MyChart) OR A paper copy in the mail If you have any lab test that is abnormal or we need to change your treatment, we will call you to review the results.   Testing/Procedures: NONE    Follow-Up: At [facility], you and your health needs are our priority.  As part of our continuing mission to provide you with exceptional heart care, we have created designated Provider Care Teams.  These Care Teams include your primary Cardiologist (physician) and Advanced Practice Providers (APPs -  Physician Assistants and Nurse Practitioners) who all work together to provide you with the care you need, when you need it.  We recommend signing up for the patient portal called "MyChart".  Sign up information is provided on this After Visit Summary.  MyChart is used to connect with patients for Virtual Visits (Telemedicine).  Patients are able to view lab/test results, encounter notes, upcoming appointments, etc.  Non-urgent messages can be sent to your provider as well.   To learn more about what you can do with MyChart, go to [email or web address].    Your next appointment:   2 month(s)  Provider:   Norman Herrlich, MD    Other Instructions

## 2023-01-30 NOTE — Patient Instructions (Signed)
 Ferumoxytol Injection What is this medication? FERUMOXYTOL (FER ue MOX i tol) treats low levels of iron in your body (iron deficiency anemia). Iron is a mineral that plays an important role in making red blood cells, which carry oxygen from your lungs to the rest of your body. This medicine may be used for other purposes; ask your health care provider or pharmacist if you have questions. COMMON BRAND NAME(S): Feraheme What should I tell my care team before I take this medication? They need to know if you have any of these conditions: Anemia not caused by low iron levels High levels of iron in the blood Magnetic resonance imaging (MRI) test scheduled An unusual or allergic reaction to iron, other medications, foods, dyes, or preservatives Pregnant or trying to get pregnant Breastfeeding How should I use this medication? This medication is injected into a vein. It is given by your care team in a hospital or clinic setting. Talk to your care team the use of this medication in children. Special care may be needed. Overdosage: If you think you have taken too much of this medicine contact a poison control center or emergency room at once. NOTE: This medicine is only for you. Do not share this medicine with others. What if I miss a dose? It is important not to miss your dose. Call your care team if you are unable to keep an appointment. What may interact with this medication? Other iron products This list may not describe all possible interactions. Give your health care provider a list of all the medicines, herbs, non-prescription drugs, or dietary supplements you use. Also tell them if you smoke, drink alcohol, or use illegal drugs. Some items may interact with your medicine. What should I watch for while using this medication? Visit your care team regularly. Tell your care team if your symptoms do not start to get better or if they get worse. You may need blood work done while you are taking this  medication. You may need to follow a special diet. Talk to your care team. Foods that contain iron include: whole grains/cereals, dried fruits, beans, or peas, leafy green vegetables, and organ meats (liver, kidney). What side effects may I notice from receiving this medication? Side effects that you should report to your care team as soon as possible: Allergic reactions--skin rash, itching, hives, swelling of the face, lips, tongue, or throat Low blood pressure--dizziness, feeling faint or lightheaded, blurry vision Shortness of breath Side effects that usually do not require medical attention (report to your care team if they continue or are bothersome): Flushing Headache Joint pain Muscle pain Nausea Pain, redness, or irritation at injection site This list may not describe all possible side effects. Call your doctor for medical advice about side effects. You may report side effects to FDA at 1-800-FDA-1088. Where should I keep my medication? This medication is given in a hospital or clinic. It will not be stored at home. NOTE: This sheet is a summary. It may not cover all possible information. If you have questions about this medicine, talk to your doctor, pharmacist, or health care provider.  2024 Elsevier/Gold Standard (2022-10-13 00:00:00)

## 2023-01-31 LAB — BASIC METABOLIC PANEL WITH GFR
BUN/Creatinine Ratio: 22 — ABNORMAL HIGH (ref 9–20)
BUN: 36 mg/dL — ABNORMAL HIGH (ref 6–24)
CO2: 22 mmol/L (ref 20–29)
Calcium: 10 mg/dL (ref 8.7–10.2)
Chloride: 104 mmol/L (ref 96–106)
Creatinine, Ser: 1.65 mg/dL — ABNORMAL HIGH (ref 0.76–1.27)
Glucose: 246 mg/dL — ABNORMAL HIGH (ref 70–99)
Potassium: 5.8 mmol/L (ref 3.5–5.2)
Sodium: 140 mmol/L (ref 134–144)
eGFR: 48 mL/min/1.73 — ABNORMAL LOW

## 2023-02-01 ENCOUNTER — Telehealth: Payer: Self-pay | Admitting: *Deleted

## 2023-02-01 DIAGNOSIS — N1832 Chronic kidney disease, stage 3b: Secondary | ICD-10-CM

## 2023-02-01 DIAGNOSIS — E875 Hyperkalemia: Secondary | ICD-10-CM

## 2023-02-01 MED ORDER — LOKELMA 10 G PO PACK: 10.0000 g | PACK | Freq: Three times a day (TID) | ORAL | Status: DC

## 2023-02-01 NOTE — Telephone Encounter (Signed)
Spoke with Kenneth Bailey and urged him to come by and get Lokelma to take 3 times in one day due to high potassium. Kenneth Bailey stated he did not have a ride and could come tomorrow. I asked Kenneth Bailey if there was someone who could come by and pick it up for him but he didn't think so. Let Kenneth Bailey know that coming another day is not advised and he said he would see what he could do. Left sample up front with a note to return for another BMP to re check his potassium. Put typed instructions with sample for Kenneth Bailey. Per [name].

## 2023-02-01 NOTE — Telephone Encounter (Signed)
-----   Message from Kenneth Bailey sent at 02/01/2023  7:23 AM EDT ----- Kidney function is slightly worse and his potassium is too high.  Have him stop taking his potassium supplement and spironolactone.  Have him come in today and get a sample of lokelma--we have one sample left, take 10 mg po TID x 1 day.  Return Friday for repeat BMET.

## 2023-02-02 MED FILL — Ferumoxytol Inj 510 MG/17ML (30 MG/ML) (Elemental Fe): INTRAVENOUS | Qty: 17 | Status: AC

## 2023-02-05 ENCOUNTER — Inpatient Hospital Stay: Payer: Medicaid Other

## 2023-02-05 VITALS — BP 122/66 | HR 88 | Temp 97.7°F | Resp 16 | Ht 68.0 in | Wt 179.1 lb

## 2023-02-05 DIAGNOSIS — D500 Iron deficiency anemia secondary to blood loss (chronic): Secondary | ICD-10-CM | POA: Diagnosis not present

## 2023-02-05 MED ORDER — SODIUM CHLORIDE 0.9 % IV SOLN
510.0000 mg | Freq: Once | INTRAVENOUS | Status: AC
  Administered 2023-02-05: 510 mg via INTRAVENOUS
  Filled 2023-02-05: qty 510

## 2023-02-05 MED ORDER — ACETAMINOPHEN 325 MG PO TABS
650.0000 mg | ORAL_TABLET | Freq: Once | ORAL | Status: AC
  Administered 2023-02-05: 650 mg via ORAL
  Filled 2023-02-05: qty 2

## 2023-02-05 MED ORDER — LORATADINE 10 MG PO TABS
10.0000 mg | ORAL_TABLET | Freq: Once | ORAL | Status: AC
  Administered 2023-02-05: 10 mg via ORAL
  Filled 2023-02-05: qty 1

## 2023-02-05 MED ORDER — SODIUM CHLORIDE 0.9 % IV SOLN: Freq: Once | INTRAVENOUS | Status: AC

## 2023-02-05 NOTE — Patient Instructions (Signed)
Ferumoxytol Injection What is this medication? FERUMOXYTOL (FER ue MOX i tol) treats low levels of iron in your body (iron deficiency anemia). Iron is a mineral that plays an important role in making red blood cells, which carry oxygen from your lungs to the rest of your body. This medicine may be used for other purposes; ask your health care provider or pharmacist if you have questions. COMMON BRAND NAME(S): Feraheme What should I tell my care team before I take this medication? They need to know if you have any of these conditions: Anemia not caused by low iron levels High levels of iron in the blood Magnetic resonance imaging (MRI) test scheduled An unusual or allergic reaction to iron, other medications, foods, dyes, or preservatives Pregnant or trying to get pregnant Breastfeeding How should I use this medication? This medication is injected into a vein. It is given by your care team in a hospital or clinic setting. Talk to your care team the use of this medication in children. Special care may be needed. Overdosage: If you think you have taken too much of this medicine contact a poison control center or emergency room at once. NOTE: This medicine is only for you. Do not share this medicine with others. What if I miss a dose? It is important not to miss your dose. Call your care team if you are unable to keep an appointment. What may interact with this medication? Other iron products This list may not describe all possible interactions. Give your health care provider a list of all the medicines, herbs, non-prescription drugs, or dietary supplements you use. Also tell them if you smoke, drink alcohol, or use illegal drugs. Some items may interact with your medicine. What should I watch for while using this medication? Visit your care team regularly. Tell your care team if your symptoms do not start to get better or if they get worse. You may need blood work done while you are taking this  medication. You may need to follow a special diet. Talk to your care team. Foods that contain iron include: whole grains/cereals, dried fruits, beans, or peas, leafy green vegetables, and organ meats (liver, kidney). What side effects may I notice from receiving this medication? Side effects that you should report to your care team as soon as possible: Allergic reactions--skin rash, itching, hives, swelling of the face, lips, tongue, or throat Low blood pressure--dizziness, feeling faint or lightheaded, blurry vision Shortness of breath Side effects that usually do not require medical attention (report to your care team if they continue or are bothersome): Flushing Headache Joint pain Muscle pain Nausea Pain, redness, or irritation at injection site This list may not describe all possible side effects. Call your doctor for medical advice about side effects. You may report side effects to FDA at 1-800-FDA-1088. Where should I keep my medication? This medication is given in a hospital or clinic. It will not be stored at home. NOTE: This sheet is a summary. It may not cover all possible information. If you have questions about this medicine, talk to your doctor, pharmacist, or health care provider.  2024 Elsevier/Gold Standard (2022-10-13 00:00:00)

## 2023-02-06 ENCOUNTER — Inpatient Hospital Stay (HOSPITAL_BASED_OUTPATIENT_CLINIC_OR_DEPARTMENT_OTHER): Payer: Medicaid Other | Admitting: Oncology

## 2023-02-06 ENCOUNTER — Encounter: Payer: Self-pay | Admitting: Oncology

## 2023-02-06 ENCOUNTER — Inpatient Hospital Stay: Payer: Medicaid Other

## 2023-02-06 VITALS — BP 132/71 | HR 94 | Temp 98.4°F | Resp 18 | Ht 68.0 in | Wt 177.2 lb

## 2023-02-06 DIAGNOSIS — K269 Duodenal ulcer, unspecified as acute or chronic, without hemorrhage or perforation: Secondary | ICD-10-CM | POA: Diagnosis not present

## 2023-02-06 DIAGNOSIS — K253 Acute gastric ulcer without hemorrhage or perforation: Secondary | ICD-10-CM

## 2023-02-06 DIAGNOSIS — Z794 Long term (current) use of insulin: Secondary | ICD-10-CM

## 2023-02-06 DIAGNOSIS — Z716 Tobacco abuse counseling: Secondary | ICD-10-CM

## 2023-02-06 DIAGNOSIS — D500 Iron deficiency anemia secondary to blood loss (chronic): Secondary | ICD-10-CM

## 2023-02-06 DIAGNOSIS — E1169 Type 2 diabetes mellitus with other specified complication: Secondary | ICD-10-CM | POA: Diagnosis not present

## 2023-02-06 DIAGNOSIS — N189 Chronic kidney disease, unspecified: Secondary | ICD-10-CM | POA: Insufficient documentation

## 2023-02-06 DIAGNOSIS — N1831 Chronic kidney disease, stage 3a: Secondary | ICD-10-CM

## 2023-02-06 NOTE — Progress Notes (Signed)
[facility] Cancer Follow up Visit:  Patient Care Team: [name], MD as PCP - General (Family Medicine) Dulce Sellar Iline Oven, MD as PCP - Cardiology (Cardiology) Louann Sjogren, DPM as Consulting Physician (Podiatry)  CHIEF COMPLAINTS/PURPOSE OF CONSULTATION:  HISTORY OF PRESENTING ILLNESS: Kenneth Bailey 57 y.o. male is here because of anemia  Medical history is notable for history of peptic/duodenal ulcers, type II diabetes, hypertension, hyperlipidemia, congestive heart failure, coronary artery disease-status post CABG. Status post left fifth toe amputation.   January 2023:CTA chest-slightly enlarged lymph nodes in the mediastinum and bilateral hilar areas largest measuring 1.5.  Patchy groundglass densities in both perihilar regions and lower lung fields with small to moderate bilateral pleural effusions.  2.2 x 0.8 hypervascular structure in the anterior aspect of the body of the pancreas.  This may suggest vascular malformation or neoplastic process.  Mild gallbladder wall thickening with questionable cholecystic stranding (physiologic or suggest acute or chronic inflammation).  No solid enhancing pancreas lesion identified. Corresponding to the recent CT abnormality is a focal area relative hypoenhancement measuring 2.2 x 1.0 cm, image 41/17. Does not have the typical appearance of the pancreatic neoplasm in may represent a thrombosed vessel   June 10-28 2024: Admitted to [facility] with sepsis due to osteomyelitis, acute kidney injury, hematemesis.  Creatinine 2.9.  Hydrated.  S/P debridement left foot ulcer, left 5th toe amputation.  June 15-HbG 7.  MCV 85.  Status post 2 units PRBC June 16-EGD-nonbleeding reflux esophagitis, nonbleeding gastric and duodenal ulcers.  Pathology-reactive gastropathy negative for H. pylori, intestinal metaplasia, dysplasia and carcinoma. June 17-HgB 7.1.  Status post 2 units PRBC     July 4 -8 2024: Admitted to [facility] melena.   Hemoglobin 7.8.  Status post 1 unit PRBC             July 5-EGD-diffuse gastritis, non-bleeding duodenal ulcer with a nonbleeding visible vessel found in the bulb. Injected with epinephrine. Treated with bipolar cautery. Clips placed   January 11, 2023: [name], ED with bilateral lower extremity peripheral edema.  Placed on furosemide 20 mg daily and potassium chloride 20 meq daily.  Follow-up with cardiology recommended.             August 23-ferritin 69   January 17, 2023: [name] ED with orthostatic hypotension and hyperkalemia.  Hydrated.  Follow-up with cardiology recommended    September 1- 3 2024: Admitted to [facility] with acute kidney injury, hyperkalemia.  Creatinine 1.9.  Calcium 5.8.  Hydrated             September 3-HgB 9.6.  MCV 81.  Creatinine 1.2.  Potassium 4.1   January 23, 2023: Hematology consult Discharged from [facility] today.  Reports fatigue and pica to ice.  Decreased appetite without weight loss.  Denies melena or hematochezia.Takes clopidogrel 75 mg and aspirin 81 mg daily.    WBC 9.6 hemoglobin 10.3 MCV 86 platelets 372; 77 segs 13 lymphs 5 monos 3 eos 1 Baso 1 immature granulocytes Peripheral smear-normal WBC morphology, RBC morphology-teardrops and polychromasia.    CMP notable for creatinine 1.54 calcium 10.4, glucose 162.  Ferritin 18.  Folate 8.7.  B12 274.    Social history: He currently smokes a half a pack a day. Smoked cigarettes since age 71 at times, up to 3 packs a day for at least 5 years.  He is divorced with no children and lives alone.  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   Family history: He denies any family history of anemia, other hematologic abnormality, or malignancy.  Coombs test negative haptoglobin 326 SPEP with IEP but no paraprotein serum kappa 47.8 lambda 21.7 ratio 2.2 IgG 903 IgA 172 IgM 63  January 30, 2023: Feraheme 510 mg CMP notable for potassium 5.8 glucose 246 creatinine  1.65  February 05 2023: Feraheme 510 mg  February 06, 2023: Scheduled follow-up for anemia.  States he is not taking diclofenac.  He is taking MVI with iron.  Takes ASA and Plavix.  He is down to 1 or 2 cigarettes daily.  Does not drink EtOH.   No hematochezia, melena.  No pica to ice currently but did have it a few weeks ago.  Energy level OK.      Follow up in 1 month to assess Hgb and iron stores.    Review of Systems - Oncology  MEDICAL HISTORY: Past Medical History:  Diagnosis Date   Acute combined systolic and diastolic heart failure (HCC)    Anemia    CHF (congestive heart failure) (HCC)    COPD (chronic obstructive pulmonary disease) (HCC)    Diabetes mellitus, type 2 (HCC)    Gastric ulcer    HLD (hyperlipidemia)    Hypertension    Kidney disease, chronic, stage III (GFR 30-59 ml/min) (HCC)    Multiple duodenal ulcers    Orthostatic hypotension    Osteomyelitis (HCC)    PUD (peptic ulcer disease)    Tobacco abuse     SURGICAL HISTORY: Past Surgical History:  Procedure Laterality Date   ABDOMINAL AORTOGRAM W/LOWER EXTREMITY N/A 11/03/2022   Procedure: ABDOMINAL AORTOGRAM W/LOWER EXTREMITY;  Surgeon: Cephus Shelling, MD;  [facility] INV[facility];  Service: Cardiovascular;  Laterality: N/A;   AMPUTATION Left 10/31/2022   Procedure: AMPUTATION LEFT FIFTH TOE;  Surgeon: Louann Sjogren, DPM;  Location: [facility] OR;  Service: Podiatry;  Laterality: Left;   AMPUTATION Left 11/15/2022   Procedure: AMPUTATION FITH RAY;  Surgeon: Felecia Shelling, DPM;  Location: [facility] OR;  Service: Podiatry;  Laterality: Left;   AMPUTATION TOE Left 10/31/2022   Procedure: IRRIGATION AND DEBRIDEMENTOF LEFT FOOT;  Surgeon: Louann Sjogren, DPM;  Location: [facility] OR;  Service: Podiatry;  Laterality: Left;   BIOPSY  11/05/2022   Procedure: BIOPSY;  Surgeon: Jeani Hawking, MD;  Location: [facility] ENDOSCOPY;  Service: Gastroenterology;;   CORONARY ARTERY BYPASS GRAFT N/A 06/10/2021   Procedure: CORONARY ARTERY  BYPASS GRAFTING (CABG) TIMES FOUR, USING LEFT INTERNAL MAMMARY ARTERY AND RIGHT GREATER SAPHENOUS VEIN HARVESTED ENDOSCOPICALLY;  Surgeon: Lovett Sox, MD;  Location: [facility] OR;  Service: Open Heart Surgery;  Laterality: N/A;   ENDOVEIN HARVEST OF GREATER SAPHENOUS VEIN Right 06/10/2021   Procedure: ENDOVEIN HARVEST OF GREATER SAPHENOUS VEIN;  Surgeon: Lovett Sox, MD;  Location: [facility] OR;  Service: Open Heart Surgery;  Laterality: Right;   ESOPHAGOGASTRODUODENOSCOPY N/A 11/24/2022   Procedure: ESOPHAGOGASTRODUODENOSCOPY (EGD);  Surgeon: Lemar Lofty., MD;  Location: [facility] ENDOSCOPY;  Service: Gastroenterology;  Laterality: N/A;   ESOPHAGOGASTRODUODENOSCOPY (EGD) WITH PROPOFOL N/A 11/05/2022   Procedure: ESOPHAGOGASTRODUODENOSCOPY (EGD) WITH PROPOFOL;  Surgeon: Jeani Hawking, MD;  Location: [facility] ENDOSCOPY;  Service: Gastroenterology;  Laterality: N/A;   HEMOSTASIS CLIP PLACEMENT  11/24/2022   Procedure: HEMOSTASIS CLIP PLACEMENT;  Surgeon: Lemar Lofty., MD;  Location: [facility] ENDOSCOPY;  Service: Gastroenterology;;   HEMOSTASIS CONTROL  11/24/2022   Procedure: HEMOSTASIS CONTROL;  Surgeon: Lemar Lofty., MD;  Location: [facility] ENDOSCOPY;  Service: Gastroenterology;;  HOT HEMOSTASIS N/A 11/24/2022   Procedure: HOT HEMOSTASIS (ARGON PLASMA COAGULATION/BICAP);  Surgeon: Lemar Lofty., MD;  Location: [facility] ENDOSCOPY;  Service: Gastroenterology;  Laterality: N/A;   I & D EXTREMITY Left 11/08/2022   Procedure: IRRIGATION AND DEBRIDEMENT OF FOOT AND WASHOUT;  Surgeon: Vivi Barrack, DPM;  Location[facility];  Service: Podiatry;  Laterality: Left;   PERIPHERAL VASCULAR BALLOON ANGIOPLASTY  11/03/2022   Procedure: PERIPHERAL VASCULAR BALLOON ANGIOPLASTY;  Surgeon: Cephus Shelling, MD;  [facility];  Service: Cardiovascular;;   RIGHT/LEFT HEART CATH AND CORONARY ANGIOGRAPHY N/A 06/06/2021   Procedure: RIGHT/LEFT HEART CATH AND CORONARY ANGIOGRAPHY;  Surgeon: Dolores Patty, MD;  [facility];  Service: Cardiovascular;  Laterality: N/A;   TEE WITHOUT CARDIOVERSION N/A 06/10/2021   Procedure: TRANSESOPHAGEAL ECHOCARDIOGRAM (TEE);  Surgeon: Lovett Sox, MD;  Location: [facility] OR;  Service: Open Heart Surgery;  Laterality: N/A;    SOCIAL HISTORY: Social History   Socioeconomic History   Marital status: Divorced    Spouse name: Not on file   Number of children: Not on file   Years of education: Not on file   Highest education level: 7th grade  Occupational History   Occupation: out of work at the present time  Tobacco Use   Smoking status: Every Day    Current packs/day: 1.00    Average packs/day: 1 pack/day for 51.0 years (51.0 ttl pk-yrs)    Types: Cigarettes    Start date: 01/23/1972    Passive exposure: Never   Smokeless tobacco: Never   Tobacco comments:    Trying to quit with Chantix-12/2022  Vaping Use   Vaping status: Never Used  Substance and Sexual Activity   Alcohol use: Yes    Comment: occasionally beer every other weekend   Drug use: Never   Sexual activity: Not on file  Other Topics Concern   Not on file  Social History Narrative   Not on file   Social Determinants of Health   Financial Resource Strain: Not at Risk (06/14/2022)   Received from [place], [place]    Financial Resource Strain: 1  Food Insecurity: No Food Insecurity (01/23/2023)   Hunger Vital Sign    Worried About Running Out of Food in the Last Year: Never true    Ran Out of Food in the Last Year: Never true  Transportation Needs: No Transportation Needs (01/23/2023)   PRAPARE - Administrator, Civil Service (Medical): No    Lack of Transportation (Non-Medical): No  Physical Activity: Not at Risk (06/14/2022)   Received from [place], [place]   Physical Activity    Physical Activity: 1  Stress: Not at Risk (06/14/2022)   Received from [facility], [place]   Stress    Stress: 1  Social Connections: Not at Risk  (06/14/2022)   Received from [facility]   Social Connections    Connectedness: 1  Intimate Partner Violence: Not At Risk (01/23/2023)   Humiliation, Afraid, Rape, and Kick questionnaire    Fear of Current or Ex-Partner: No    Emotionally Abused: No    Physically Abused: No    Sexually Abused: No    FAMILY HISTORY Family History  Problem Relation Age of Onset   Hypertension Mother    Cancer Mother    Heart disease Father     ALLERGIES:  is allergic to lisinopril and statins.  MEDICATIONS:  Current Outpatient Medications  Medication Sig Dispense Refill  acetaminophen (TYLENOL) 325 MG tablet Take 2 tablets (650 mg total) by mouth every 6 (six) hours as needed for mild pain, fever or headache. 20 tablet 0   aspirin EC 81 MG tablet Take 1 tablet (81 mg total) by mouth daily. Swallow whole. 30 tablet 6   clopidogrel (PLAVIX) 75 MG tablet Take 1 tablet (75 mg total) by mouth daily. 30 tablet 0   dapagliflozin propanediol (FARXIGA) 10 MG TABS tablet Take 1 tablet (10 mg total) by mouth daily before breakfast. 30 tablet 6   ezetimibe (ZETIA) 10 MG tablet Take 1 tablet (10 mg total) by mouth daily. 30 tablet 0   furosemide (LASIX) 20 MG tablet Take 1 tablet (20 mg total) by mouth 2 (two) times daily. 180 tablet 0   gabapentin (NEURONTIN) 600 MG tablet Take 600 mg by mouth 3 (three) times daily. Patient reports taking twice daily     gemfibrozil (LOPID) 600 MG tablet Take 600 mg by mouth 2 (two) times daily before a meal.     insulin aspart (NOVOLOG) 100 UNIT/ML injection Inject 0-9 Units into the skin 3 (three) times daily with meals. CBG < 70: Implement Hypoglycemia Standing Orders CBG 70 - 120: 0 units CBG 121 - 150: 1 unit CBG 151 - 200: 2 units CBG 201 - 250: 3 units CBG 251 - 300: 5 units CBG 301 - 350: 7 units CBG 351 - 400: 9 units CBG > 400: call MD and obtain STAT lab verification 10 mL 0   Insulin Syringe-Needle U-100 30G X 1/2" 0.3 ML MISC Use with Novolog 90 each 0   Melatonin 10 MG  TABS Take 10 mg by mouth at bedtime.     metFORMIN (GLUCOPHAGE) 1000 MG tablet Take 1,000 mg by mouth 2 (two) times daily.     mupirocin ointment (BACTROBAN) 2 % Apply 1 Application topically daily. 22 g 0   nitroGLYCERIN (NITROSTAT) 0.4 MG SL tablet Place 1 tablet (0.4 mg total) under the tongue every 5 (five) minutes as needed for chest pain. 90 tablet 3   oxyCODONE (OXY IR/ROXICODONE) 5 MG immediate release tablet Take 5 mg by mouth every 6 (six) hours as needed for breakthrough pain or moderate pain.     pantoprazole (PROTONIX) 40 MG tablet Take 1 tablet (40 mg total) by mouth 2 (two) times daily. 60 tablet 1   polyethylene glycol (MIRALAX / GLYCOLAX) 17 g packet Take 17 g by mouth daily as needed for mild constipation. 14 each 0   potassium chloride SA (KLOR-CON M) 20 MEQ tablet Take 1 tablet (20 mEq total) by mouth daily. 7 tablet 0   sacubitril-valsartan (ENTRESTO) 24-26 MG Take 1 tablet by mouth 2 (two) times daily. 180 tablet 3   SEMGLEE, YFGN, 100 UNIT/ML Pen Inject into the skin.     senna-docusate (SENOKOT-S) 8.6-50 MG tablet Take 1 tablet by mouth 2 (two) times daily. 30 tablet 0   sodium zirconium cyclosilicate (LOKELMA) 10 g PACK packet Take 10 g by mouth 3 (three) times daily.     spironolactone (ALDACTONE) 25 MG tablet Take 25 mg by mouth once.     Varenicline Tartrate, Starter, (CHANTIX STARTING MONTH PAK) 0.5 MG X 11 & 1 MG X 42 TBPK Take 0.5 mg by mouth as directed. 1 each 0   No current facility-administered medications for this visit.    PHYSICAL EXAMINATION:  ECOG PERFORMANCE STATUS: 1 - Symptomatic but completely ambulatory   Vitals:   02/06/23 1137 02/06/23 1139  BP: Marland Kitchen)  141/100 132/71  Pulse: 94   Resp: 18   Temp: 98.4 F (36.9 C)   SpO2: 100%     Filed Weights   02/06/23 1137  Weight: 177 lb 3.2 oz (80.4 kg)     Physical Exam Vitals and nursing note reviewed.  Constitutional:      General: He is not in acute distress.    Appearance: Normal  appearance. He is normal weight. He is not diaphoretic.  HENT:     Head: Normocephalic and atraumatic.     Right Ear: External ear normal.     Left Ear: External ear normal.     Nose: Nose normal.  Eyes:     General: No scleral icterus.    Conjunctiva/sclera: Conjunctivae normal.     Pupils: Pupils are equal, round, and reactive to light.  Cardiovascular:     Rate and Rhythm: Normal rate and regular rhythm.     Heart sounds: Normal heart sounds. No murmur heard.    No friction rub. No gallop.  Pulmonary:     Effort: Pulmonary effort is normal. No respiratory distress.     Breath sounds: Normal breath sounds. No wheezing.  Abdominal:     General: Bowel sounds are normal.     Palpations: Abdomen is soft.     Tenderness: There is no abdominal tenderness. There is no guarding or rebound.  Musculoskeletal:        General: No swelling.     Cervical back: Normal range of motion and neck supple. No rigidity or tenderness.     Right lower leg: No edema.     Left lower leg: No edema.  Lymphadenopathy:     Head:     Right side of head: No submental, submandibular, tonsillar, preauricular, posterior auricular or occipital adenopathy.     Left side of head: No submental, submandibular, tonsillar, preauricular, posterior auricular or occipital adenopathy.     Cervical: No cervical adenopathy.     Right cervical: No superficial, deep or posterior cervical adenopathy.    Left cervical: No superficial, deep or posterior cervical adenopathy.     Upper Body:     Right upper body: No supraclavicular or axillary adenopathy.     Left upper body: No supraclavicular or axillary adenopathy.     Lower Body: No right inguinal adenopathy. No left inguinal adenopathy.  Skin:    Coloration: Skin is not jaundiced or pale.     Findings: No erythema.  Neurological:     General: No focal deficit present.     Mental Status: He is alert and oriented to person, place, and time.     Cranial Nerves: No cranial  nerve deficit.  Psychiatric:        Mood and Affect: Mood normal.        Behavior: Behavior normal.        Thought Content: Thought content normal.        Judgment: Judgment normal.     LABORATORY DATA: I have personally reviewed the data as listed:  Office Visit on 01/30/2023  Component Date Value Ref Range Status   Glucose 01/30/2023 246 (H)  70 - 99 mg/dL Final   BUN 43/32/9518 36 (H)  6 - 24 mg/dL Final   Creatinine, Ser 01/30/2023 1.65 (H)  0.76 - 1.27 mg/dL Final   eGFR 84/16/6063 48 (L)  >59 mL/min/1.73 Final   BUN/Creatinine Ratio 01/30/2023 22 (H)  9 - 20 Final   Sodium 01/30/2023 140  134 - 144  mmol/L Final   Potassium 01/30/2023 5.8 (HH)  3.5 - 5.2 mmol/L Final                     Client Requested Flag   Chloride 01/30/2023 104  96 - 106 mmol/L Final   CO2 01/30/2023 22  20 - 29 mmol/L Final   Calcium 01/30/2023 10.0  8.7 - 10.2 mg/dL Final  Appointment on 16/02/9603  Component Date Value Ref Range Status   WBC MORPHOLOGY 01/23/2023 MORPHOLOGY UNREMARKABLE   Final   RBC MORPHOLOGY 01/23/2023 OVALOCYTES   Final   Comment: TEARDROP CELLS POLYCHROMASIA PRESENT    Plt Morphology 01/23/2023 PLATELETS APPEAR ADEQUATE   Final   Clinical Information 01/23/2023 anemia   Final   Performed at [facility], [address] [place], [place] [address]   Retic Ct Pct 01/23/2023 2.6  0.4 - 3.1 % Final   RBC. 01/23/2023 3.96 (L)  4.22 - 5.81 MIL/uL Final   Retic Count, Absolute 01/23/2023 104.1  19.0 - 186.0 K/uL Final   Immature Retic Fract 01/23/2023 27.5 (H)  2.3 - 15.9 % Final   Performed at [facility], [address], [place], [place] [address]   Zinc 01/23/2023 73  44 - 115 ug/dL Final   Comment: (NOTE) This test was developed and its performance characteristics determined by [facility]. It has not been cleared or approved by the Food and Drug Administration.                                Detection Limit = 5 Performed At: [facility] [address] [place], [place] [id number] Jolene Schimke MD YQ:[id number]    DAT, complement 01/23/2023 NEG   Final   DAT, IgG 01/23/2023    Final                   Value:NEG Performed at [facility], [address] [place], [place] [address]    IgG (Immunoglobin G), Serum 01/23/2023 903  603 - 1,613 mg/dL Final   IgA 32/44/0102 172  90 - 386 mg/dL Final   IgM (Immunoglobulin M), Srm 01/23/2023 63  20 - 172 mg/dL Final   Total Protein ELP 01/23/2023 7.3  6.0 - 8.5 g/dL Corrected   Albumin SerPl Elph-Mcnc 01/23/2023 3.8  2.9 - 4.4 g/dL Corrected   Alpha 1 72/53/6644 0.3  0.0 - 0.4 g/dL Corrected   Alpha2 Glob SerPl Elph-Mcnc 01/23/2023 1.3 (H)  0.4 - 1.0 g/dL Corrected   B-Globulin SerPl Elph-Mcnc 01/23/2023 1.2  0.7 - 1.3 g/dL Corrected   Gamma Glob SerPl Elph-Mcnc 01/23/2023 0.8  0.4 - 1.8 g/dL Corrected   M Protein SerPl Elph-Mcnc 01/23/2023 Not Observed  Not Observed g/dL Corrected   Globulin, Total 01/23/2023 3.5  2.2 - 3.9 g/dL Corrected   Albumin/Glob SerPl 01/23/2023 1.1  0.7 - 1.7 Corrected   IFE 1 01/23/2023 Comment   Corrected   Comment: (NOTE) The immunofixation pattern appears unremarkable. Evidence of monoclonal protein is not apparent.    Please Note 01/23/2023 Comment   Corrected   Comment: (NOTE) Protein electrophoresis scan will follow via computer, mail, or courier delivery. Performed At: [facility] [address] [place], [place] [id number] Jolene Schimke MD [id number]    Kappa free light chain 01/23/2023 47.8 (H)  3.3 - 19.4 mg/L Final   Lambda free light chains 01/23/2023 21.7  5.7 -  26.3 mg/L Final   Kappa, lambda light chain ratio 01/23/2023 2.20 (H)  0.26 - 1.65 Final   Comment: (NOTE) Performed At: [facility] [address] [place], [place] [id number] Jolene Schimke MD JX:[id number]    Copper 01/23/2023 128  69 - 132 ug/dL Final   Comment: (NOTE) This test was developed and its performance  characteristics determined by [facility]. It has not been cleared or approved by the Food and Drug Administration.                                Detection Limit = 5 Performed At: [facility] [address] [place], [place] [id number] Jolene Schimke MD ON:[id number]    Haptoglobin 01/23/2023 326  29 - 370 mg/dL Final   Comment: (NOTE) Performed At: [facility] [address] [place], [place] [id number] Jolene Schimke MD [id number]    Vitamin B-12 01/23/2023 274  180 - 914 pg/mL Final   Comment: (NOTE) This assay is not validated for testing neonatal or myeloproliferative syndrome specimens for Vitamin B12 levels. Performed at [facility], [address], [place], [place] [address]    Folate 01/23/2023 8.7  >5.9 ng/mL Final   Performed at [facility], [address] [place], [place] [address]   Ferritin 01/23/2023 18 (L)  24 - 336 ng/mL Final   Performed at [facility], [address] [place], [place] [address]   LDH 01/23/2023 113  98 - 192 U/L Final   Performed at [facility], [address], [place], [place] [address]   Sodium 01/23/2023 137  135 - 145 mmol/L Final   Potassium 01/23/2023 4.4  3.5 - 5.1 mmol/L Final   Chloride 01/23/2023 100  98 - 111 mmol/L Final   CO2 01/23/2023 24  22 - 32 mmol/L Final   Glucose, Bld 01/23/2023 162 (H)  70 - 99 mg/dL Final   Glucose reference range applies only to samples taken after fasting for at least 8 hours.   BUN 01/23/2023 26 (H)  6 - 20 mg/dL Final   Creatinine 60/63/0160 1.54 (H)  0.61 - 1.24 mg/dL Final   Calcium 10/93/2355 10.4 (H)  8.9 - 10.3 mg/dL Final   Total Protein 73/22/0254 8.3 (H)  6.5 - 8.1 g/dL Final   Albumin 27/10/2374 3.9  3.5 - 5.0 g/dL Final   AST 28/31/5176 13 (L)  15 - 41 U/L Final   ALT 01/23/2023 12  0 - 44 U/L Final   Alkaline Phosphatase 01/23/2023 85  38 - 126 U/L Final   Total Bilirubin 01/23/2023 0.4  0.3  - 1.2 mg/dL Final   GFR, Estimated 01/23/2023 53 (L)  >60 mL/min Final   Comment: (NOTE) Calculated using the CKD-EPI Creatinine Equation (2021)    Anion gap 01/23/2023 13  5 - 15 Final   Performed at [facility], [address], [place], [place] [address]   WBC Count 01/23/2023 9.6  4.0 - 10.5 K/uL Final   RBC 01/23/2023 3.95 (L)  4.22 - 5.81 MIL/uL Final   Hemoglobin 01/23/2023 10.3 (L)  13.0 - 17.0 g/dL Final   HCT 71/10/2692 33.9 (L)  39.0 - 52.0 % Final   MCV 01/23/2023 85.8  80.0 - 100.0 fL Final   MCH 01/23/2023 26.1  26.0 - 34.0 pg Final   MCHC 01/23/2023 30.4  30.0 - 36.0 g/dL Final   RDW 85/46/2703 15.9 (  H)  11.5 - 15.5 % Final   Platelet Count 01/23/2023 372  150 - 400 K/uL Final   nRBC 01/23/2023 0.0  0.0 - 0.2 % Final   Neutrophils Relative % 01/23/2023 77  % Final   Neutro Abs 01/23/2023 7.4  1.7 - 7.7 K/uL Final   Lymphocytes Relative 01/23/2023 13  % Final   Lymphs Abs 01/23/2023 1.2  0.7 - 4.0 K/uL Final   Monocytes Relative 01/23/2023 5  % Final   Monocytes Absolute 01/23/2023 0.5  0.1 - 1.0 K/uL Final   Eosinophils Relative 01/23/2023 3  % Final   Eosinophils Absolute 01/23/2023 0.3  0.0 - 0.5 K/uL Final   Basophils Relative 01/23/2023 1  % Final   Basophils Absolute 01/23/2023 0.1  0.0 - 0.1 K/uL Final   Immature Granulocytes 01/23/2023 1  % Final   Abs Immature Granulocytes 01/23/2023 0.08 (H)  0.00 - 0.07 K/uL Final   Performed at [facility], [address], [place], [place] [address]  Admission on 01/17/2023, Discharged on 01/17/2023  Component Date Value Ref Range Status   WBC 01/17/2023 8.0  4.0 - 10.5 K/uL Final   RBC 01/17/2023 4.16 (L)  4.22 - 5.81 MIL/uL Final   Hemoglobin 01/17/2023 10.3 (L)  13.0 - 17.0 g/dL Final   HCT 95/28/4132 34.7 (L)  39.0 - 52.0 % Final   MCV 01/17/2023 83.4  80.0 - 100.0 fL Final   MCH 01/17/2023 24.8 (L)  26.0 - 34.0 pg Final   MCHC 01/17/2023 29.7 (L)  30.0 - 36.0 g/dL Final   RDW  44/05/270 14.6  11.5 - 15.5 % Final   Platelets 01/17/2023 395  150 - 400 K/uL Final   nRBC 01/17/2023 0.0  0.0 - 0.2 % Final   Neutrophils Relative % 01/17/2023 73  % Final   Neutro Abs 01/17/2023 5.9  1.7 - 7.7 K/uL Final   Lymphocytes Relative 01/17/2023 14  % Final   Lymphs Abs 01/17/2023 1.1  0.7 - 4.0 K/uL Final   Monocytes Relative 01/17/2023 7  % Final   Monocytes Absolute 01/17/2023 0.5  0.1 - 1.0 K/uL Final   Eosinophils Relative 01/17/2023 4  % Final   Eosinophils Absolute 01/17/2023 0.3  0.0 - 0.5 K/uL Final   Basophils Relative 01/17/2023 1  % Final   Basophils Absolute 01/17/2023 0.1  0.0 - 0.1 K/uL Final   Immature Granulocytes 01/17/2023 1  % Final   Abs Immature Granulocytes 01/17/2023 0.09 (H)  0.00 - 0.07 K/uL Final   Performed at [facility], [address], [place], [place] [address]   Sodium 01/17/2023 136  135 - 145 mmol/L Final   Potassium 01/17/2023 5.2 (H)  3.5 - 5.1 mmol/L Final   Chloride 01/17/2023 98  98 - 111 mmol/L Final   CO2 01/17/2023 27  22 - 32 mmol/L Final   Glucose, Bld 01/17/2023 340 (H)  70 - 99 mg/dL Final   Glucose reference range applies only to samples taken after fasting for at least 8 hours.   BUN 01/17/2023 19  6 - 20 mg/dL Final   Creatinine, Ser 01/17/2023 1.68 (H)  0.61 - 1.24 mg/dL Final   Calcium 40/34/7425 9.6  8.9 - 10.3 mg/dL Final   Total Protein 95/63/8756 7.8  6.5 - 8.1 g/dL Final   Albumin 43/32/9518 3.5  3.5 - 5.0 g/dL Final   AST 84/16/6063 13 (L)  15 - 41 U/L Final   ALT 01/17/2023 10  0 -  44 U/L Final   Alkaline Phosphatase 01/17/2023 85  38 - 126 U/L Final   Total Bilirubin 01/17/2023 0.5  0.3 - 1.2 mg/dL Final   GFR, Estimated 01/17/2023 47 (L)  >60 mL/min Final   Comment: (NOTE) Calculated using the CKD-EPI Creatinine Equation (2021)    Anion gap 01/17/2023 11  5 - 15 Final   Performed at [facility], [address] [place], [place] [address]   Fecal Occult Bld 01/17/2023 NEGATIVE  NEGATIVE Final   Office Visit on 01/12/2023  Component Date Value Ref Range Status   Total Iron Binding Capacity 01/12/2023 337  250 - 450 ug/dL Final   UIBC 60/45/4098 321  111 - 343 ug/dL Final   Iron 11/91/4782 16 (L)  38 - 169 ug/dL Final   Iron Saturation 01/12/2023 5 (LL)  15 - 55 % Final   Ferritin 01/12/2023 69  30 - 400 ng/mL Final   Vitamin B-12 01/12/2023 368  232 - 1,245 pg/mL Final  Admission on 01/11/2023, Discharged on 01/11/2023  Component Date Value Ref Range Status   WBC 01/11/2023 13.6 (H)  4.0 - 10.5 K/uL Final   RBC 01/11/2023 3.43 (L)  4.22 - 5.81 MIL/uL Final   Hemoglobin 01/11/2023 8.7 (L)  13.0 - 17.0 g/dL Final   HCT 95/62/1308 28.9 (L)  39.0 - 52.0 % Final   MCV 01/11/2023 84.3  80.0 - 100.0 fL Final   MCH 01/11/2023 25.4 (L)  26.0 - 34.0 pg Final   MCHC 01/11/2023 30.1  30.0 - 36.0 g/dL Final   RDW 65/78/4696 14.5  11.5 - 15.5 % Final   Platelets 01/11/2023 265  150 - 400 K/uL Final   nRBC 01/11/2023 0.0  0.0 - 0.2 % Final   Neutrophils Relative % 01/11/2023 77  % Final   Neutro Abs 01/11/2023 10.5 (H)  1.7 - 7.7 K/uL Final   Lymphocytes Relative 01/11/2023 11  % Final   Lymphs Abs 01/11/2023 1.5  0.7 - 4.0 K/uL Final   Monocytes Relative 01/11/2023 9  % Final   Monocytes Absolute 01/11/2023 1.3 (H)  0.1 - 1.0 K/uL Final   Eosinophils Relative 01/11/2023 2  % Final   Eosinophils Absolute 01/11/2023 0.3  0.0 - 0.5 K/uL Final   Basophils Relative 01/11/2023 0  % Final   Basophils Absolute 01/11/2023 0.1  0.0 - 0.1 K/uL Final   Immature Granulocytes 01/11/2023 1  % Final   Abs Immature Granulocytes 01/11/2023 0.08 (H)  0.00 - 0.07 K/uL Final   Performed at [facility], [address], [place], [place] [address]   Sodium 01/11/2023 136  135 - 145 mmol/L Final   Potassium 01/11/2023 4.5  3.5 - 5.1 mmol/L Final   Chloride 01/11/2023 102  98 - 111 mmol/L Final   CO2 01/11/2023 19 (L)  22 - 32 mmol/L Final   Glucose, Bld 01/11/2023 208 (H)  70 - 99 mg/dL Final    Glucose reference range applies only to samples taken after fasting for at least 8 hours.   BUN 01/11/2023 37 (H)  6 - 20 mg/dL Final   Creatinine, Ser 01/11/2023 1.79 (H)  0.61 - 1.24 mg/dL Final   Calcium 41/32/4401 8.7 (L)  8.9 - 10.3 mg/dL Final   Total Protein 02/72/5366 6.9  6.5 - 8.1 g/dL Final   Albumin 44/07/4740 3.2 (L)  3.5 - 5.0 g/dL Final   AST 59/56/3875 14 (L)  15 - 41 U/L Final   ALT 01/11/2023  11  0 - 44 U/L Final   Alkaline Phosphatase 01/11/2023 113  38 - 126 U/L Final   Total Bilirubin 01/11/2023 0.4  0.3 - 1.2 mg/dL Final   GFR, Estimated 01/11/2023 44 (L)  >60 mL/min Final   Comment: (NOTE) Calculated using the CKD-EPI Creatinine Equation (2021)    Anion gap 01/11/2023 15  5 - 15 Final   Performed at [facility], [address] [place], [place] [address]   B Natriuretic Peptide 01/11/2023 211.4 (H)  0.0 - 100.0 pg/mL Final   Performed at [facility], [address] [place], [place] [address]    RADIOGRAPHIC STUDIES: I have personally reviewed the radiological images as listed and agree with the findings in the report  No results found.  ASSESSMENT/PLAN 57 y.o. male is here because of anemia.  Medical history is notable for history of peptic/duodenal ulcers, type II diabetes, hypertension, hyperlipidemia, congestive heart failure, coronary artery disease-status post CABG. Status post left fifth toe amputation.  Anemia:  Etiology multifactorial with culprits being 1) Iron deficiency from GI blood loss exacerbated by antiplatelet therapy 2) CKD 3) Chronic inflammation   February 04 2024- Feraheme 510 mg   February 06 2023- Scheduled to receive an additional dose of Feraheme.  Will see in 1 month to assess Hgb and iron stores and determine if needs additional iron   Chronic kidney disease  February 06 2023- Likely related to DM Type II, CHF, PVD and HTN.    SPEP with IEP and free light chains from earlier this month negative for  paraprotein  Tobacco use  February 06 2023- Discussed strategies for smoking cessation   Cancer Staging  No matching staging information was found for the patient.    No problem-specific Assessment & Plan notes found for this encounter.    No orders of the defined types were placed in this encounter.   30  minutes was spent in patient care.  This included time spent preparing to see the patient (e.g., review of tests), obtaining and/or reviewing separately obtained history, counseling and educating the patient/family/caregiver, ordering medications, tests, or procedures; documenting clinical information in the electronic or other health record, independently interpreting results and communicating results to the patient/family/caregiver as well as coordination of care.       All questions were answered. The patient knows to call the clinic with any problems, questions or concerns.  This note was electronically signed.    Loni Muse, MD  02/06/2023 5:36 PM

## 2023-02-07 ENCOUNTER — Telehealth: Payer: Self-pay | Admitting: Oncology

## 2023-02-07 NOTE — Telephone Encounter (Signed)
02/07/23 Spoke with patient and confirmed next appt.

## 2023-02-20 ENCOUNTER — Ambulatory Visit: Payer: Medicaid Other | Admitting: Podiatry

## 2023-02-20 DIAGNOSIS — Z9889 Other specified postprocedural states: Secondary | ICD-10-CM

## 2023-02-20 DIAGNOSIS — M86672 Other chronic osteomyelitis, left ankle and foot: Secondary | ICD-10-CM

## 2023-02-20 DIAGNOSIS — L97522 Non-pressure chronic ulcer of other part of left foot with fat layer exposed: Secondary | ICD-10-CM

## 2023-02-20 NOTE — Progress Notes (Signed)
Subjective:  Patient ID: Tamera Punt, male    DOB: 1966/03/09,  MRN: [id number]  Chief Complaint  Patient presents with   Routine Post Op    DOS 11/15/2022 POV#6 Amputation Fith Ray L    DOS: 11/15/2022 Procedure: Partial fifth ray amputation left foot initially performed by Dr. Ralene Cork and then completed by Dr. Loreta Ave and revision surgery completed by Dr. Logan Bores with total amputation of the fifth ray.  57 y.o. male returns for post-op check.  He is now 12  weeks postop on his fifth ray amputation closure.    Review of Systems: Negative except as noted in the HPI. Denies N/V/F/Ch.   Objective:  There were no vitals filed for this visit. There is no height or weight on file to calculate BMI. Constitutional Well developed. Well nourished.  Vascular Foot warm and well perfused. Capillary refill normal to all digits.  Calf is soft and supple, no posterior calf or knee pain, negative Homans' sign  Neurologic Normal speech. Oriented to person, place, and time. Epicritic sensation to light touch grossly present bilaterally.  Dermatologic Skin healing well without signs of infection.  Superficial ulceration of the lateral aspect 5th met head.  Upon debridement of overlying eschar there is wound that probes to subcu fat level.  Wound measures 1 x 0.7 x 0.3 cm postdebridement no significant erythema maceration or odor present from the wound.     Orthopedic: Tenderness to palpation noted about the surgical site.   Multiple view plain film radiographs: Deferred at this visit Assessment:   1. Ulcer of left foot with fat layer exposed (HCC)   2. 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 Post-operative state        Plan:  Patient was evaluated and treated and all questions answered.  S/p foot surgery left -Progressing as expected post-operatively.  Do believe patient is smoking which is delaying his healing.    Recommend smoking cessation otherwise wound may  not heal appropriately -XR: Deferred -WB Status: WBAT in regular shoe gear -Medications: No indication for abx currently -Overall wound is improving.  Debrided the wound at this visit.  Continue with local wound care with mupirocin ointment and dry gauze dressing 3 times per week -Continue home dressing changes  3 times weekly with mupirocin ointment and dry gauze dressing   Procedure: Excisional Debridement of Wound Rationale: Removal of non-viable soft tissue from the wound to promote healing.  Anesthesia: none Post-Debridement Wound Measurements: 1 cm x 0.7 cm x 0.3 cm  Type of Debridement: Sharp Excisional Tissue Removed: Non-viable soft tissue Depth of Debridement: subcutaneous tissue. Technique: Sharp excisional debridement to bleeding, viable wound base.  Dressing: Dry, sterile, compression dressing. Disposition: Patient tolerated procedure well.       Return in about 4 weeks (around 03/20/2023) for f/u L lateral forefoot ulcer.         [name], DPM [facility]

## 2023-03-07 ENCOUNTER — Inpatient Hospital Stay: Payer: Medicaid Other

## 2023-03-07 ENCOUNTER — Inpatient Hospital Stay: Payer: Medicaid Other | Attending: Hematology and Oncology | Admitting: Oncology

## 2023-03-07 VITALS — BP 122/79 | HR 87 | Temp 97.6°F | Resp 18 | Ht 68.0 in | Wt 173.8 lb

## 2023-03-07 DIAGNOSIS — D500 Iron deficiency anemia secondary to blood loss (chronic): Secondary | ICD-10-CM

## 2023-03-07 DIAGNOSIS — Z720 Tobacco use: Secondary | ICD-10-CM

## 2023-03-07 DIAGNOSIS — K922 Gastrointestinal hemorrhage, unspecified: Secondary | ICD-10-CM | POA: Insufficient documentation

## 2023-03-07 DIAGNOSIS — Z716 Tobacco abuse counseling: Secondary | ICD-10-CM | POA: Diagnosis not present

## 2023-03-07 DIAGNOSIS — N1831 Chronic kidney disease, stage 3a: Secondary | ICD-10-CM

## 2023-03-07 LAB — CBC WITH DIFFERENTIAL/PLATELET
Abs Immature Granulocytes: 0.02 10*3/uL (ref 0.00–0.07)
Basophils Absolute: 0 10*3/uL (ref 0.0–0.1)
Basophils Relative: 1 %
Eosinophils Absolute: 0.2 10*3/uL (ref 0.0–0.5)
Eosinophils Relative: 5 %
HCT: 43.5 % (ref 39.0–52.0)
Hemoglobin: 13.9 g/dL (ref 13.0–17.0)
Immature Granulocytes: 0 %
Lymphocytes Relative: 21 %
Lymphs Abs: 1.1 10*3/uL (ref 0.7–4.0)
MCH: 28.1 pg (ref 26.0–34.0)
MCHC: 32 g/dL (ref 30.0–36.0)
MCV: 87.9 fL (ref 80.0–100.0)
Monocytes Absolute: 0.4 10*3/uL (ref 0.1–1.0)
Monocytes Relative: 8 %
Neutro Abs: 3.4 10*3/uL (ref 1.7–7.7)
Neutrophils Relative %: 65 %
Platelets: 219 10*3/uL (ref 150–400)
RBC: 4.95 MIL/uL (ref 4.22–5.81)
RDW: 17.5 % — ABNORMAL HIGH (ref 11.5–15.5)
WBC: 5.1 10*3/uL (ref 4.0–10.5)
nRBC: 0 % (ref 0.0–0.2)

## 2023-03-07 LAB — FERRITIN: Ferritin: 81 ng/mL (ref 24–336)

## 2023-03-07 NOTE — Progress Notes (Signed)
[facility] Cancer Follow up Visit:  Patient Care Team: [name], MD as PCP - General (Family Medicine) Dulce Sellar Iline Oven, MD as PCP - Cardiology (Cardiology) Louann Sjogren, DPM as Consulting Physician (Podiatry)  CHIEF COMPLAINTS/PURPOSE OF CONSULTATION:  HISTORY OF PRESENTING ILLNESS: Kenneth Bailey 57 y.o. male is here because of anemia  Medical history is notable for history of peptic/duodenal ulcers, type II diabetes, hypertension, hyperlipidemia, congestive heart failure, coronary artery disease-status post CABG. Status post left fifth toe amputation.   January 2023:CTA chest-slightly enlarged lymph nodes in the mediastinum and bilateral hilar areas largest measuring 1.5.  Patchy groundglass densities in both perihilar regions and lower lung fields with small to moderate bilateral pleural effusions.  2.2 x 0.8 hypervascular structure in the anterior aspect of the body of the pancreas.  This may suggest vascular malformation or neoplastic process.  Mild gallbladder wall thickening with questionable cholecystic stranding (physiologic or suggest acute or chronic inflammation).  No solid enhancing pancreas lesion identified. Corresponding to the recent CT abnormality is a focal area relative hypoenhancement measuring 2.2 x 1.0 cm, image 41/17. Does not have the typical appearance of the pancreatic neoplasm in may represent a thrombosed vessel   June 10-28 2024: Admitted to [facility] with sepsis due to osteomyelitis, acute kidney injury, hematemesis.  Creatinine 2.9.  Hydrated.  S/P debridement left foot ulcer, left 5th toe amputation.  June 15-HbG 7.  MCV 85.  Status post 2 units PRBC June 16-EGD-nonbleeding reflux esophagitis, nonbleeding gastric and duodenal ulcers.  Pathology-reactive gastropathy negative for H. pylori, intestinal metaplasia, dysplasia and carcinoma. June 17-HgB 7.1.  Status post 2 units PRBC     July 4 -8 2024: Admitted to [facility] melena.   Hemoglobin 7.8.  Status post 1 unit PRBC             July 5-EGD-diffuse gastritis, non-bleeding duodenal ulcer with a nonbleeding visible vessel found in the bulb. Injected with epinephrine. Treated with bipolar cautery. Clips placed   January 11, 2023: [name], ED with bilateral lower extremity peripheral edema.  Placed on furosemide 20 mg daily and potassium chloride 20 meq daily.  Follow-up with cardiology recommended.             August 23-ferritin 69   January 17, 2023: [name] ED with orthostatic hypotension and hyperkalemia.  Hydrated.  Follow-up with cardiology recommended    September 1- 3 2024: Admitted to [facility] with acute kidney injury, hyperkalemia.  Creatinine 1.9.  Calcium 5.8.  Hydrated             September 3-HgB 9.6.  MCV 81.  Creatinine 1.2.  Potassium 4.1   January 23, 2023: Hematology consult Discharged from [facility] today.  Reports fatigue and pica to ice.  Decreased appetite without weight loss.  Denies melena or hematochezia.Takes clopidogrel 75 mg and aspirin 81 mg daily.    WBC 9.6 hemoglobin 10.3 MCV 86 platelets 372; 77 segs 13 lymphs 5 monos 3 eos 1 Baso 1 immature granulocytes Peripheral smear-normal WBC morphology, RBC morphology-teardrops and polychromasia.    CMP notable for creatinine 1.54 calcium 10.4, glucose 162.  Ferritin 18.  Folate 8.7.  B12 274.    Social history: He currently smokes a half a pack a day. Smoked cigarettes since age 54 at times, up to 3 packs a day for at least 5 years.  He is divorced with no children and lives alone.  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   Family history: He denies any family history of anemia, other hematologic abnormality, or malignancy.  Coombs test negative haptoglobin 326 SPEP with IEP but no paraprotein serum kappa 47.8 lambda 21.7 ratio 2.2 IgG 903 IgA 172 IgM 63  January 30, 2023: Feraheme 510 mg CMP notable for potassium 5.8 glucose 246 creatinine  1.65  February 05 2023: Feraheme 510 mg  February 06, 2023:  States he is not taking diclofenac.  He is taking MVI with iron.  Takes ASA and Plavix.  He is down to 1 or 2 cigarettes daily.  Does not drink EtOH.   No hematochezia, melena.  No pica to ice currently but did have it a few weeks ago.  Energy level OK.      Follow up in 1 month to assess Hgb and iron stores.    February 20 2023:  Excisional debridement of left lateral forefoot ulcer  March 07 2023:  Scheduled follow-up for anemia.  He is back up to smoking 1 ppd of cigarettes daily.  Discussed importance of smoking cessation Ferritin 81 Hgb 13.9   Review of Systems - Oncology  MEDICAL HISTORY: Past Medical History:  Diagnosis Date   Acute combined systolic and diastolic heart failure (HCC)    Anemia    CHF (congestive heart failure) (HCC)    COPD (chronic obstructive pulmonary disease) (HCC)    Diabetes mellitus, type 2 (HCC)    Gastric ulcer    HLD (hyperlipidemia)    Hypertension    Kidney disease, chronic, stage III (GFR 30-59 ml/min) (HCC)    Multiple duodenal ulcers    Orthostatic hypotension    Osteomyelitis (HCC)    PUD (peptic ulcer disease)    Tobacco abuse     SURGICAL HISTORY: Past Surgical History:  Procedure Laterality Date   ABDOMINAL AORTOGRAM W/LOWER EXTREMITY N/A 11/03/2022   Procedure: ABDOMINAL AORTOGRAM W/LOWER EXTREMITY;  Surgeon: Cephus Shelling, MD;  [facility]: [facility] INV[facility];  Service: Cardiovascular;  Laterality: N/A;   AMPUTATION Left 10/31/2022   Procedure: AMPUTATION LEFT FIFTH TOE;  Surgeon: Louann Sjogren, DPM;  Location: [facility] OR;  Service: Podiatry;  Laterality: Left;   AMPUTATION Left 11/15/2022   Procedure: AMPUTATION FITH RAY;  Surgeon: Felecia Shelling, DPM;  Location: [facility] OR;  Service: Podiatry;  Laterality: Left;   AMPUTATION TOE Left 10/31/2022   Procedure: IRRIGATION AND DEBRIDEMENTOF LEFT FOOT;  Surgeon: Louann Sjogren, DPM;  Location: [facility] OR;  Service: Podiatry;   Laterality: Left;   BIOPSY  11/05/2022   Procedure: BIOPSY;  Surgeon: Jeani Hawking, MD;  Location: [facility] ENDOSCOPY;  Service: Gastroenterology;;   CORONARY ARTERY BYPASS GRAFT N/A 06/10/2021   Procedure: CORONARY ARTERY BYPASS GRAFTING (CABG) TIMES FOUR, USING LEFT INTERNAL MAMMARY ARTERY AND RIGHT GREATER SAPHENOUS VEIN HARVESTED ENDOSCOPICALLY;  Surgeon: Lovett Sox, MD;  Location: [facility] OR;  Service: Open Heart Surgery;  Laterality: N/A;   ENDOVEIN HARVEST OF GREATER SAPHENOUS VEIN Right 06/10/2021   Procedure: ENDOVEIN HARVEST OF GREATER SAPHENOUS VEIN;  Surgeon: Lovett Sox, MD;  Location: [facility] OR;  Service: Open Heart Surgery;  Laterality: Right;   ESOPHAGOGASTRODUODENOSCOPY N/A 11/24/2022   Procedure: ESOPHAGOGASTRODUODENOSCOPY (EGD);  Surgeon: Lemar Lofty., MD;  Location: [facility] ENDOSCOPY;  Service: Gastroenterology;  Laterality: N/A;   ESOPHAGOGASTRODUODENOSCOPY (EGD) WITH PROPOFOL N/A 11/05/2022   Procedure: ESOPHAGOGASTRODUODENOSCOPY (EGD) WITH PROPOFOL;  Surgeon: Jeani Hawking, MD;  Location: [facility] ENDOSCOPY;  Service: Gastroenterology;  Laterality: N/A;   HEMOSTASIS CLIP PLACEMENT  11/24/2022   Procedure:  HEMOSTASIS CLIP PLACEMENT;  Surgeon: Mansouraty, Netty Starring., MD;  Location: [facility] ENDOSCOPY;  Service: Gastroenterology;;   HEMOSTASIS CONTROL  11/24/2022   Procedure: HEMOSTASIS CONTROL;  Surgeon: Lemar Lofty., MD;  Location: [facility] ENDOSCOPY;  Service: Gastroenterology;;   HOT HEMOSTASIS N/A 11/24/2022   Procedure: HOT HEMOSTASIS (ARGON PLASMA COAGULATION/BICAP);  Surgeon: Lemar Lofty., MD;  Location: [facility] ENDOSCOPY;  Service: Gastroenterology;  Laterality: N/A;   I & D EXTREMITY Left 11/08/2022   Procedure: IRRIGATION AND DEBRIDEMENT OF FOOT AND WASHOUT;  Surgeon: Vivi Barrack, DPM;  Location: [facility];  Service: Podiatry;  Laterality: Left;   PERIPHERAL VASCULAR BALLOON ANGIOPLASTY  11/03/2022   Procedure: PERIPHERAL VASCULAR BALLOON ANGIOPLASTY;  Surgeon: Cephus Shelling, MD;  [facility];  Service: Cardiovascular;;   RIGHT/LEFT HEART CATH AND CORONARY ANGIOGRAPHY N/A 06/06/2021   Procedure: RIGHT/LEFT HEART CATH AND CORONARY ANGIOGRAPHY;  Surgeon: Dolores Patty, MD;  [facility];  Service: Cardiovascular;  Laterality: N/A;   TEE WITHOUT CARDIOVERSION N/A 06/10/2021   Procedure: TRANSESOPHAGEAL ECHOCARDIOGRAM (TEE);  Surgeon: Lovett Sox, MD;  Location: [facility] OR;  Service: Open Heart Surgery;  Laterality: N/A;    SOCIAL HISTORY: Social History   Socioeconomic History   Marital status: Divorced    Spouse name: Not on file   Number of children: Not on file   Years of education: Not on file   Highest education level: 7th grade  Occupational History   Occupation: out of work at the present time  Tobacco Use   Smoking status: Every Day    Current packs/day: 1.00    Average packs/day: 1 pack/day for 51.1 years (51.1 ttl pk-yrs)    Types: Cigarettes    Start date: 01/23/1972    Passive exposure: Never   Smokeless tobacco: Never   Tobacco comments:    Trying to quit with Chantix-12/2022  Vaping Use   Vaping status: Never Used  Substance and Sexual Activity   Alcohol use: Yes    Comment: occasionally beer every other weekend   Drug use: Never   Sexual activity: Not on file  Other Topics Concern   Not on file  Social History Narrative   Not on file   Social Determinants of Health   Financial Resource Strain: Not at Risk (06/14/2022)   Received from [place], [employer]    Financial Resource Strain: 1  Food Insecurity: No Food Insecurity (01/23/2023)   Hunger Vital Sign    Worried About Running Out of Food in the Last Year: Never true    Ran Out of Food in the Last Year: Never true  Transportation Needs: No Transportation Needs (01/23/2023)   PRAPARE - Administrator, Civil Service (Medical): No    Lack of Transportation (Non-Medical): No  Physical Activity: Not at  Risk (06/14/2022)   Received from [place], [place]   Physical Activity    Physical Activity: 1  Stress: Not at Risk (06/14/2022)   Received from [facility], [place]   Stress    Stress: 1  Social Connections: Not at Risk (06/14/2022)   Received from [facility]   Social Connections    Connectedness: 1  Intimate Partner Violence: Not At Risk (01/23/2023)   Humiliation, Afraid, Rape, and Kick questionnaire    Fear of Current or Ex-Partner: No    Emotionally Abused: No    Physically Abused: No    Sexually Abused: No    FAMILY HISTORY Family History  Problem  Relation Age of Onset   Hypertension Mother    Cancer Mother    Heart disease Father     ALLERGIES:  is allergic to lisinopril and statins.  MEDICATIONS:  Current Outpatient Medications  Medication Sig Dispense Refill   acetaminophen (TYLENOL) 325 MG tablet Take 2 tablets (650 mg total) by mouth every 6 (six) hours as needed for mild pain, fever or headache. 20 tablet 0   aspirin EC 81 MG tablet Take 1 tablet (81 mg total) by mouth daily. Swallow whole. 30 tablet 6   clopidogrel (PLAVIX) 75 MG tablet Take 1 tablet (75 mg total) by mouth daily. 30 tablet 0   dapagliflozin propanediol (FARXIGA) 10 MG TABS tablet Take 1 tablet (10 mg total) by mouth daily before breakfast. 30 tablet 6   ezetimibe (ZETIA) 10 MG tablet Take 1 tablet (10 mg total) by mouth daily. 30 tablet 0   furosemide (LASIX) 20 MG tablet Take 1 tablet (20 mg total) by mouth 2 (two) times daily. 180 tablet 0   gabapentin (NEURONTIN) 600 MG tablet Take 600 mg by mouth 3 (three) times daily. Patient reports taking twice daily     gemfibrozil (LOPID) 600 MG tablet Take 600 mg by mouth 2 (two) times daily before a meal.     insulin aspart (NOVOLOG) 100 UNIT/ML injection Inject 0-9 Units into the skin 3 (three) times daily with meals. CBG < 70: Implement Hypoglycemia Standing Orders CBG 70 - 120: 0 units CBG 121 - 150: 1 unit CBG 151 - 200: 2 units CBG 201 - 250: 3 units CBG 251 - 300: 5  units CBG 301 - 350: 7 units CBG 351 - 400: 9 units CBG > 400: call MD and obtain STAT lab verification 10 mL 0   Insulin Syringe-Needle U-100 30G X 1/2" 0.3 ML MISC Use with Novolog 90 each 0   Melatonin 10 MG TABS Take 10 mg by mouth at bedtime.     metFORMIN (GLUCOPHAGE) 1000 MG tablet Take 1,000 mg by mouth 2 (two) times daily.     mupirocin ointment (BACTROBAN) 2 % Apply 1 Application topically daily. 22 g 0   nitroGLYCERIN (NITROSTAT) 0.4 MG SL tablet Place 1 tablet (0.4 mg total) under the tongue every 5 (five) minutes as needed for chest pain. 90 tablet 3   oxyCODONE (OXY IR/ROXICODONE) 5 MG immediate release tablet Take 5 mg by mouth every 6 (six) hours as needed for breakthrough pain or moderate pain.     pantoprazole (PROTONIX) 40 MG tablet Take 1 tablet (40 mg total) by mouth 2 (two) times daily. 60 tablet 1   polyethylene glycol (MIRALAX / GLYCOLAX) 17 g packet Take 17 g by mouth daily as needed for mild constipation. 14 each 0   potassium chloride SA (KLOR-CON M) 20 MEQ tablet Take 1 tablet (20 mEq total) by mouth daily. 7 tablet 0   sacubitril-valsartan (ENTRESTO) 24-26 MG Take 1 tablet by mouth 2 (two) times daily. 180 tablet 3   SEMGLEE, YFGN, 100 UNIT/ML Pen Inject into the skin.     senna-docusate (SENOKOT-S) 8.6-50 MG tablet Take 1 tablet by mouth 2 (two) times daily. 30 tablet 0   sodium zirconium cyclosilicate (LOKELMA) 10 g PACK packet Take 10 g by mouth 3 (three) times daily.     spironolactone (ALDACTONE) 25 MG tablet Take 25 mg by mouth once.     Varenicline Tartrate, Starter, (CHANTIX STARTING MONTH PAK) 0.5 MG X 11 & 1 MG X 42 TBPK Take  0.5 mg by mouth as directed. 1 each 0   No current facility-administered medications for this visit.    PHYSICAL EXAMINATION:  ECOG PERFORMANCE STATUS: 1 - Symptomatic but completely ambulatory   There were no vitals filed for this visit.   There were no vitals filed for this visit.    Physical Exam Vitals and nursing note  reviewed.  Constitutional:      General: He is not in acute distress.    Appearance: Normal appearance. He is normal weight. He is not diaphoretic.  HENT:     Head: Normocephalic and atraumatic.     Right Ear: External ear normal.     Left Ear: External ear normal.     Nose: Nose normal.  Eyes:     General: No scleral icterus.    Conjunctiva/sclera: Conjunctivae normal.     Pupils: Pupils are equal, round, and reactive to light.  Cardiovascular:     Rate and Rhythm: Normal rate and regular rhythm.     Heart sounds: Normal heart sounds. No murmur heard.    No friction rub. No gallop.  Pulmonary:     Effort: Pulmonary effort is normal. No respiratory distress.     Breath sounds: Normal breath sounds. No wheezing.  Abdominal:     General: Bowel sounds are normal.     Palpations: Abdomen is soft.     Tenderness: There is no abdominal tenderness. There is no guarding or rebound.  Musculoskeletal:        General: No swelling.     Cervical back: Normal range of motion and neck supple. No rigidity or tenderness.     Right lower leg: No edema.     Left lower leg: No edema.  Lymphadenopathy:     Head:     Right side of head: No submental, submandibular, tonsillar, preauricular, posterior auricular or occipital adenopathy.     Left side of head: No submental, submandibular, tonsillar, preauricular, posterior auricular or occipital adenopathy.     Cervical: No cervical adenopathy.     Right cervical: No superficial, deep or posterior cervical adenopathy.    Left cervical: No superficial, deep or posterior cervical adenopathy.     Upper Body:     Right upper body: No supraclavicular or axillary adenopathy.     Left upper body: No supraclavicular or axillary adenopathy.     Lower Body: No right inguinal adenopathy. No left inguinal adenopathy.  Skin:    Coloration: Skin is not jaundiced or pale.     Findings: No erythema.  Neurological:     General: No focal deficit present.     Mental  Status: He is alert and oriented to person, place, and time.     Cranial Nerves: No cranial nerve deficit.  Psychiatric:        Mood and Affect: Mood normal.        Behavior: Behavior normal.        Thought Content: Thought content normal.        Judgment: Judgment normal.    LABORATORY DATA: I have personally reviewed the data as listed:  No visits with results within 1 Month(s) from this visit.  Latest known visit with results is:  Office Visit on 01/30/2023  Component Date Value Ref Range Status   Glucose 01/30/2023 246 (H)  70 - 99 mg/dL Final   BUN 56/21/3086 36 (H)  6 - 24 mg/dL Final   Creatinine, Ser 01/30/2023 1.65 (H)  0.76 - 1.27 mg/dL Final  eGFR 01/30/2023 48 (L)  >59 mL/min/1.73 Final   BUN/Creatinine Ratio 01/30/2023 22 (H)  9 - 20 Final   Sodium 01/30/2023 140  134 - 144 mmol/L Final   Potassium 01/30/2023 5.8 (HH)  3.5 - 5.2 mmol/L Final                     Client Requested Flag   Chloride 01/30/2023 104  96 - 106 mmol/L Final   CO2 01/30/2023 22  20 - 29 mmol/L Final   Calcium 01/30/2023 10.0  8.7 - 10.2 mg/dL Final    RADIOGRAPHIC STUDIES: I have personally reviewed the radiological images as listed and agree with the findings in the report  No results found.  ASSESSMENT/PLAN 57 y.o. male is here because of anemia.  Medical history is notable for history of peptic/duodenal ulcers, type II diabetes, hypertension, hyperlipidemia, congestive heart failure, coronary artery disease-status post CABG. Status post left fifth toe amputation.  Anemia:  Etiology multifactorial with culprits being 1) Iron deficiency from GI blood loss exacerbated by antiplatelet therapy 2) CKD 3) Chronic inflammation   February 04 2024- Feraheme 510 mg   February 05 2023- Feraheme 510 mg March 07 2023- Hgb 13.9 Ferritin 81.  Continue MVI with iron  Chronic kidney disease  February 06 2023- Likely related to DM Type II, CHF, PVD and HTN.    SPEP with IEP and free light chains  from earlier this month negative for paraprotein  Tobacco use  February 06 2023- Discussed strategies for smoking cessation  March 07 2023- Continued encouragement for smoking cessation particularly given severity of PVD   Cancer Staging  No matching staging information was found for the patient.    No problem-specific Assessment & Plan notes found for this encounter.    No orders of the defined types were placed in this encounter.   30  minutes was spent in patient care.  This included time spent preparing to see the patient (e.g., review of tests), obtaining and/or reviewing separately obtained history, counseling and educating the patient/family/caregiver, ordering medications, tests, or procedures; documenting clinical information in the electronic or other health record, independently interpreting results and communicating results to the patient/family/caregiver as well as coordination of care.       All questions were answered. The patient knows to call the clinic with any problems, questions or concerns.  This note was electronically signed.    Loni Muse, MD  03/07/2023 9:23 AM

## 2023-03-08 ENCOUNTER — Telehealth: Payer: Self-pay | Admitting: Oncology

## 2023-03-08 NOTE — Telephone Encounter (Signed)
Contacted pt to schedule an appt but he requested that we wait until some time in December to call him to schedule this follow up.   Follow-up disposition: Return in about 3 months (around 06/07/2023) for physician, labs.

## 2023-03-16 ENCOUNTER — Encounter: Payer: Self-pay | Admitting: Hematology and Oncology

## 2023-03-26 ENCOUNTER — Ambulatory Visit: Payer: Medicaid Other

## 2023-03-26 ENCOUNTER — Ambulatory Visit (INDEPENDENT_AMBULATORY_CARE_PROVIDER_SITE_OTHER): Payer: Medicaid Other | Admitting: Podiatry

## 2023-03-26 DIAGNOSIS — E11621 Type 2 diabetes mellitus with foot ulcer: Secondary | ICD-10-CM

## 2023-03-26 DIAGNOSIS — M86472 Chronic osteomyelitis with draining sinus, left ankle and foot: Secondary | ICD-10-CM | POA: Diagnosis not present

## 2023-03-26 DIAGNOSIS — L97529 Non-pressure chronic ulcer of other part of left foot with unspecified severity: Secondary | ICD-10-CM

## 2023-03-26 DIAGNOSIS — L97524 Non-pressure chronic ulcer of other part of left foot with necrosis of bone: Secondary | ICD-10-CM

## 2023-03-26 NOTE — Progress Notes (Signed)
Subjective:  Patient ID: Kenneth Bailey, male    DOB: 09-28-1965,  MRN: [id number]  Chief Complaint  Patient presents with   Foot Ulcer    Left foot fifth met head- dorsal- ulcer.  Non healing.  No pain reported.  No drainage.  Using mupirocin on the wound.  Denies N/V/F/C/NS    DOS: 11/15/2022 Procedure: Partial fifth ray amputation left foot initially performed by Dr. Ralene Cork and then completed by Dr. Loreta Ave and revision surgery completed by Dr. Logan Bores with total amputation of the fifth ray.  57 y.o. male returns for post-op check.  He is now 12  weeks postop on his fifth ray amputation closure.  Still having unhealing wound at the lateral aspect of the fourth metatarsal head on the left foot having some pain at the area.  Review of Systems: Negative except as noted in the HPI. Denies N/V/F/Ch.   Objective:  There were no vitals filed for this visit. There is no height or weight on file to calculate BMI. Constitutional Well developed. Well nourished.  Vascular Weightbearing.  Findings: Warm and well perfused. Capillary refill normal to all digits.  Calf is soft and supple, no posterior calf or knee pain, negative Homans' sign  Neurologic Normal speech. Oriented to person, place, and time. Epicritic sensation to light touch grossly present bilaterally.  Dermatologic ulceration of the lateral aspect 4th met head.  Upon debridement of overlying fibrotic slough there the wound probes to bone.  Wound measures 0.5 x 0.5 x 0.3 cm postdebridement no significant erythema maceration or odor present from the wound.     Orthopedic: Tenderness to palpation noted about the ulceration   Multiple view plain film radiographs: 03/26/2023 XR 3 3 views AP lateral oblique of the left foot weightbearing.  Findings: There is been prior resection of the fourth metatarsal head and fourth ray.  No obvious evidence of erosion or osteolysis in the fourth proximal phalanx base.  No soft tissue emphysema  noted. Assessment:   1. 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 Type 2 diabetes mellitus with foot ulcer (CODE) (HCC)     Plan:  Patient was evaluated and treated and all questions answered.  S/p foot surgery left, residual ulceration to the level of bone with concern for osteomyelitis of the underlying metatarsal head -Discussed with patient there is concern for osteomyelitis given positive probe to bone from the ulceration to the fourth proximal phalanx base. -X-ray as above no obvious evidence of osteomyelitis however cannot rule out given probe to bone -Will order MRI of the left foot for further evaluation will need to be at [facility]  -WB Status: WBAT in regular shoe gear -Medications: No indication for abx currently -does not appear to be an acute infection in the area -Wound may be slightly improving however is not feeling and debrided the wound at this visit.  Continue with local wound care with mupirocin ointment and dry gauze dressing 3 times per week -Continue home dressing changes  3 times weekly with mupirocin ointment and dry gauze dressing   Procedure: Excisional Debridement of Wound Rationale: Removal of non-viable soft tissue from the wound to promote healing.  Anesthesia: none Post-Debridement Wound Measurements: 0.5 cm x 0.5 cm x 0.4 cm  Type of Debridement: Sharp Excisional Tissue Removed: Non-viable soft tissue Depth of Debridement: subcutaneous tissue. Technique: Sharp excisional debridement to bleeding, viable wound base.  Dressing: Dry,  sterile, compression dressing. Disposition: Patient tolerated procedure well.       Return in about 4 weeks (around 04/23/2023) for f/u L foot ulcer, MRI.         [name], DPM [facility] / [facility]

## 2023-03-27 ENCOUNTER — Telehealth: Payer: Self-pay | Admitting: Pharmacy Technician

## 2023-03-27 ENCOUNTER — Ambulatory Visit (INDEPENDENT_AMBULATORY_CARE_PROVIDER_SITE_OTHER): Payer: Medicaid Other | Admitting: Internal Medicine

## 2023-03-27 ENCOUNTER — Other Ambulatory Visit (HOSPITAL_COMMUNITY): Payer: Self-pay

## 2023-03-27 ENCOUNTER — Encounter (HOSPITAL_BASED_OUTPATIENT_CLINIC_OR_DEPARTMENT_OTHER): Payer: Self-pay | Admitting: Internal Medicine

## 2023-03-27 ENCOUNTER — Encounter: Payer: Self-pay | Admitting: Hematology and Oncology

## 2023-03-27 VITALS — BP 80/50 | HR 93 | Ht 68.0 in | Wt 172.0 lb

## 2023-03-27 DIAGNOSIS — E785 Hyperlipidemia, unspecified: Secondary | ICD-10-CM

## 2023-03-27 DIAGNOSIS — T466X5D Adverse effect of antihyperlipidemic and antiarteriosclerotic drugs, subsequent encounter: Secondary | ICD-10-CM

## 2023-03-27 DIAGNOSIS — I5042 Chronic combined systolic (congestive) and diastolic (congestive) heart failure: Secondary | ICD-10-CM

## 2023-03-27 DIAGNOSIS — I2510 Atherosclerotic heart disease of native coronary artery without angina pectoris: Secondary | ICD-10-CM

## 2023-03-27 DIAGNOSIS — Z720 Tobacco use: Secondary | ICD-10-CM | POA: Diagnosis not present

## 2023-03-27 DIAGNOSIS — I739 Peripheral vascular disease, unspecified: Secondary | ICD-10-CM

## 2023-03-27 DIAGNOSIS — M791 Myalgia, unspecified site: Secondary | ICD-10-CM

## 2023-03-27 NOTE — Telephone Encounter (Signed)
Pharmacy Patient Advocate Encounter   Received notification from  staff messages  that prior authorization for repatha is required/requested.   Insurance verification completed.   The patient is insured through [facility] .   Per test claim: PA required; PA submitted to above mentioned insurance via CoverMyMeds Key/confirmation #/[id number] [employer]  Status is pending

## 2023-03-27 NOTE — Patient Instructions (Signed)
Medication Instructions:  Dr. Rennis Golden recommends Repatha Sureclick 140mg /mL (PCSK9). This is an injectable cholesterol medication self-administered once every 14 days. This medication will likely need prior approval with your insurance company, which we will work on. If the medication is not approved initially, we may need to do an appeal with your insurance.   Administer medication in area of fatty tissue such as abdomen, outer thigh, back of upper arm - and rotate site with each injection Store medication in refrigerator until ready to administer - allow to sit at room temp for 30 mins - 1 hour prior to injection Dispose of medication in a SHARPS container - your pharmacy should be able to direct you on this and proper disposal    The [employer] Foundation: https://www.panfoundation.org/disease-funds/hypercholesterolemia/ -- can sign up for wait list  The [facility] offers assistance to help pay for medication copays.  They will cover copays for all cholesterol lowering meds, including statins, fibrates, omega-3 fish oils like Vascepa, ezetimibe, Repatha, Praluent, Nexletol, Nexlizet.  The cards are usually good for $2,500 or 12 months, whichever comes first. Our fax # is [phone] (you will need this to apply) Go to healthwellfoundation.org Click on "Apply Now" Answer questions as to whom is applying (patient or representative) Your disease fund will be "hypercholesterolemia - Medicare access" They will ask questions about finances and which medications you are taking for cholesterol When you submit, the approval is usually within minutes.  You will need to print the card information from the site You will need to show this information to your pharmacy, they will bill your Medicare Part D plan first -then bill [employer] --for the copay.   You can also call them at [phone], although the hold times can be quite long.     *If you need a refill on your cardiac medications before  your next appointment, please call your pharmacy*   Lab Work: FASTING lab work to check cholesterol in 3-4 months to check cholesterol -- NMR lipoprofile, LPa  If you have labs (blood work) drawn today and your tests are completely normal, you will receive your results only by: MyChart Message (if you have MyChart) OR A paper copy in the mail If you have any lab test that is abnormal or we need to change your treatment, we will call you to review the results.  Follow-Up: At [facility], you and your health needs are our priority.  As part of our continuing mission to provide you with exceptional heart care, we have created designated Provider Care Teams.  These Care Teams include your primary Cardiologist (physician) and Advanced Practice Providers (APPs -  Physician Assistants and Nurse Practitioners) who all work together to provide you with the care you need, when you need it.  We recommend signing up for the patient portal called "MyChart".  Sign up information is provided on this After Visit Summary.  MyChart is used to connect with patients for Virtual Visits (Telemedicine).  Patients are able to view lab/test results, encounter notes, upcoming appointments, etc.  Non-urgent messages can be sent to your provider as well.   To learn more about what you can do with MyChart, go to [email or web address].    Your next appointment:   3-4 months with Dr. Rennis Golden or Eligha Bridegroom NP

## 2023-03-27 NOTE — Progress Notes (Signed)
LIPID CLINIC CONSULT NOTE  Chief Complaint:  Manage dyslipidemia  Primary Care Physician: Yvonne Kendall, NP  Primary Cardiologist:  Norman Herrlich, MD  HPI:  Kenneth Bailey is a 57 y.o. male who is being seen today for the evaluation of dyslipidemia at the request of Dr. Dulce Sellar.  This is a 57 year old male with a history of coronary artery disease, PAD, chronic kidney disease and congestive heart failure.  He also has type 2 diabetes, COPD and ongoing tobacco use.  He was referred for evaluation management of dyslipidemia.  He reports having not been able to tolerate statins in the past including atorvastatin and others.  He has been on ezetimibe and recently his lipids are improved.  Total cholesterol was 148, triglycerides 253 (previously greater than 500), HDL 21 and LDL 76.  As mentioned there is ongoing tobacco abuse.  Diet does not necessarily consist of low-fat.  PMHx:  Past Medical History:  Diagnosis Date   Acute combined systolic and diastolic heart failure (HCC)    Anemia    CHF (congestive heart failure) (HCC)    COPD (chronic obstructive pulmonary disease) (HCC)    Diabetes mellitus, type 2 (HCC)    Gastric ulcer    HLD (hyperlipidemia)    Hypertension    Kidney disease, chronic, stage III (GFR 30-59 ml/min) (HCC)    Multiple duodenal ulcers    Orthostatic hypotension    Osteomyelitis (HCC)    PUD (peptic ulcer disease)    Tobacco abuse     Past Surgical History:  Procedure Laterality Date   ABDOMINAL AORTOGRAM W/LOWER EXTREMITY N/A 11/03/2022   Procedure: ABDOMINAL AORTOGRAM W/LOWER EXTREMITY;  Surgeon: Cephus Shelling, MD;  Location: [facility] INV[facility];  Service: Cardiovascular;  Laterality: N/A;   AMPUTATION Left 10/31/2022   Procedure: AMPUTATION LEFT FIFTH TOE;  Surgeon: Louann Sjogren, DPM;  Location: [facility] OR;  Service: Podiatry;  Laterality: Left;   AMPUTATION Left 11/15/2022   Procedure: AMPUTATION FITH RAY;  Surgeon: Felecia Shelling, DPM;   Location: [facility] OR;  Service: Podiatry;  Laterality: Left;   AMPUTATION TOE Left 10/31/2022   Procedure: IRRIGATION AND DEBRIDEMENTOF LEFT FOOT;  Surgeon: Louann Sjogren, DPM;  Location: [facility] OR;  Service: Podiatry;  Laterality: Left;   BIOPSY  11/05/2022   Procedure: BIOPSY;  Surgeon: Jeani Hawking, MD;  Location: [facility] ENDOSCOPY;  Service: Gastroenterology;;   CORONARY ARTERY BYPASS GRAFT N/A 06/10/2021   Procedure: CORONARY ARTERY BYPASS GRAFTING (CABG) TIMES FOUR, USING LEFT INTERNAL MAMMARY ARTERY AND RIGHT GREATER SAPHENOUS VEIN HARVESTED ENDOSCOPICALLY;  Surgeon: Lovett Sox, MD;  Location: [facility] OR;  Service: Open Heart Surgery;  Laterality: N/A;   ENDOVEIN HARVEST OF GREATER SAPHENOUS VEIN Right 06/10/2021   Procedure: ENDOVEIN HARVEST OF GREATER SAPHENOUS VEIN;  Surgeon: Lovett Sox, MD;  Location: [facility] OR;  Service: Open Heart Surgery;  Laterality: Right;   ESOPHAGOGASTRODUODENOSCOPY N/A 11/24/2022   Procedure: ESOPHAGOGASTRODUODENOSCOPY (EGD);  Surgeon: Lemar Lofty., MD;  Location: [facility] ENDOSCOPY;  Service: Gastroenterology;  Laterality: N/A;   ESOPHAGOGASTRODUODENOSCOPY (EGD) WITH PROPOFOL N/A 11/05/2022   Procedure: ESOPHAGOGASTRODUODENOSCOPY (EGD) WITH PROPOFOL;  Surgeon: Jeani Hawking, MD;  Location: [facility] ENDOSCOPY;  Service: Gastroenterology;  Laterality: N/A;   HEMOSTASIS CLIP PLACEMENT  11/24/2022   Procedure: HEMOSTASIS CLIP PLACEMENT;  Surgeon: Lemar Lofty., MD;  Location: [facility] ENDOSCOPY;  Service: Gastroenterology;;   HEMOSTASIS CONTROL  11/24/2022   Procedure: HEMOSTASIS CONTROL;  Surgeon: Lemar Lofty., MD;  Location: [facility] ENDOSCOPY;  Service: Gastroenterology;;   HOT HEMOSTASIS  N/A 11/24/2022   Procedure: HOT HEMOSTASIS (ARGON PLASMA COAGULATION/BICAP);  Surgeon: Lemar Lofty., MD;  Location: [facility] ENDOSCOPY;  Service: Gastroenterology;  Laterality: N/A;   I & D EXTREMITY Left 11/08/2022   Procedure: IRRIGATION AND DEBRIDEMENT OF FOOT AND WASHOUT;  Surgeon:  Vivi Barrack, DPM;  Location: [facility] OR;  Service: Podiatry;  Laterality: Left;   PERIPHERAL VASCULAR BALLOON ANGIOPLASTY  11/03/2022   Procedure: PERIPHERAL VASCULAR BALLOON ANGIOPLASTY;  Surgeon: Cephus Shelling, MD;  Location: [facility];  Service: Cardiovascular;;   RIGHT/LEFT HEART CATH AND CORONARY ANGIOGRAPHY N/A 06/06/2021   Procedure: RIGHT/LEFT HEART CATH AND CORONARY ANGIOGRAPHY;  Surgeon: Dolores Patty, MD;  Location: [facility];  Service: Cardiovascular;  Laterality: N/A;   TEE WITHOUT CARDIOVERSION N/A 06/10/2021   Procedure: TRANSESOPHAGEAL ECHOCARDIOGRAM (TEE);  Surgeon: Lovett Sox, MD;  Location: [facility] OR;  Service: Open Heart Surgery;  Laterality: N/A;    FAMHx:  Family History  Problem Relation Age of Onset   Hypertension Mother    Cancer Mother    Heart disease Father     SOCHx:   reports that he has been smoking cigarettes. He started smoking about 51 years ago. He has a 51.2 pack-year smoking history. He has never been exposed to tobacco smoke. He has never used smokeless tobacco. He reports current alcohol use. He reports that he does not use drugs.  ALLERGIES:  Allergies  Allergen Reactions   Lisinopril     Leg swelling   Statins     Leg swelling    ROS: Pertinent items noted in HPI and remainder of comprehensive ROS otherwise negative.  HOME MEDS: Current Outpatient Medications on File Prior to Visit  Medication Sig Dispense Refill   acetaminophen (TYLENOL) 325 MG tablet Take 2 tablets (650 mg total) by mouth every 6 (six) hours as needed for mild pain, fever or headache. 20 tablet 0   aspirin EC 81 MG tablet Take 1 tablet (81 mg total) by mouth daily. Swallow whole. 30 tablet 6   clopidogrel (PLAVIX) 75 MG tablet Take 1 tablet (75 mg total) by mouth daily. 30 tablet 0   dapagliflozin propanediol (FARXIGA) 10 MG TABS tablet Take 1 tablet (10 mg total) by mouth daily before breakfast. 30 tablet 6   ezetimibe (ZETIA) 10 MG  tablet Take 1 tablet (10 mg total) by mouth daily. 30 tablet 0   furosemide (LASIX) 20 MG tablet Take 1 tablet (20 mg total) by mouth 2 (two) times daily. 180 tablet 0   gabapentin (NEURONTIN) 600 MG tablet Take 600 mg by mouth 3 (three) times daily. Patient reports taking twice daily     gemfibrozil (LOPID) 600 MG tablet Take 600 mg by mouth 2 (two) times daily before a meal.     insulin aspart (NOVOLOG) 100 UNIT/ML injection Inject 0-9 Units into the skin 3 (three) times daily with meals. CBG < 70: Implement Hypoglycemia Standing Orders CBG 70 - 120: 0 units CBG 121 - 150: 1 unit CBG 151 - 200: 2 units CBG 201 - 250: 3 units CBG 251 - 300: 5 units CBG 301 - 350: 7 units CBG 351 - 400: 9 units CBG > 400: call MD and obtain STAT lab verification 10 mL 0   Insulin Syringe-Needle U-100 30G X 1/2" 0.3 ML MISC Use with Novolog 90 each 0   Melatonin 10 MG TABS Take 10 mg by mouth at bedtime.     metFORMIN (GLUCOPHAGE) 1000 MG tablet Take 1,000  mg by mouth 2 (two) times daily.     mupirocin ointment (BACTROBAN) 2 % Apply 1 Application topically daily. 22 g 0   nitroGLYCERIN (NITROSTAT) 0.4 MG SL tablet Place 1 tablet (0.4 mg total) under the tongue every 5 (five) minutes as needed for chest pain. 90 tablet 3   oxyCODONE (OXY IR/ROXICODONE) 5 MG immediate release tablet Take 5 mg by mouth every 6 (six) hours as needed for breakthrough pain or moderate pain.     pantoprazole (PROTONIX) 40 MG tablet Take 1 tablet (40 mg total) by mouth 2 (two) times daily. 60 tablet 1   polyethylene glycol (MIRALAX / GLYCOLAX) 17 g packet Take 17 g by mouth daily as needed for mild constipation. 14 each 0   potassium chloride SA (KLOR-CON M) 20 MEQ tablet Take 1 tablet (20 mEq total) by mouth daily. 7 tablet 0   sacubitril-valsartan (ENTRESTO) 24-26 MG Take 1 tablet by mouth 2 (two) times daily. 180 tablet 3   SEMGLEE, YFGN, 100 UNIT/ML Pen Inject into the skin.     senna-docusate (SENOKOT-S) 8.6-50 MG tablet Take 1 tablet by  mouth 2 (two) times daily. 30 tablet 0   sodium zirconium cyclosilicate (LOKELMA) 10 g PACK packet Take 10 g by mouth 3 (three) times daily.     spironolactone (ALDACTONE) 25 MG tablet Take 25 mg by mouth once.     Varenicline Tartrate, Starter, (CHANTIX STARTING MONTH PAK) 0.5 MG X 11 & 1 MG X 42 TBPK Take 0.5 mg by mouth as directed. 1 each 0   No current facility-administered medications on file prior to visit.    LABS/IMAGING: No results found for this or any previous visit (from the past 48 hour(s)). No results found.  LIPID PANEL:    Component Value Date/Time   CHOL 148 11/04/2022 0123   TRIG 253 (H) 11/04/2022 0123   HDL 21 (L) 11/04/2022 0123   CHOLHDL 7.0 11/04/2022 0123   VLDL 51 (H) 11/04/2022 0123   LDLCALC 76 11/04/2022 0123   LDLDIRECT 81 09/04/2022 1546    WEIGHTS: Wt Readings from Last 3 Encounters:  03/27/23 172 lb (78 kg)  03/07/23 173 lb 12.8 oz (78.8 kg)  02/06/23 177 lb 3.2 oz (80.4 kg)    VITALS: BP (!) 80/50 (BP Location: Left Arm, Patient Position: Sitting, Cuff Size: Normal)   Pulse 93   Ht 5\' 8"  (1.727 m)   Wt 172 lb (78 kg)   BMI 26.15 kg/m   EXAM: Deferred  EKG: Deferred  ASSESSMENT: Mixed dyslipidemia, goal LDL less than 55 (very high risk) PAOD CAD status post three-vessel CABG Systolic congestive heart failure Statin intolerance-myalgias, leg swelling  PLAN: 1.   Mr. Hawker has a mixed dyslipidemia and is at high risk for cardiovascular events.  His target LDL should be less than 55.  Recently on ezetimibe his LDL was 76 but his triglycerides remain elevated.  He needs further lipid lowering.  I would advise adding a PCSK9 inhibitor since he cannot tolerate statins.  In addition we will check an NMR and LP(a) at follow-up after about 3 months on therapy.  May also need to consider adding Vascepa the triglycerides are elevated.  We discussed smoking cessation and I her highly encouraged that today.  He says he has tried a number of  different therapies for this but has not been able to stop.  Thanks for the kind referral.  Plan follow-up with me in 3 to 4 months.  Chrystie Nose,  MD, Milagros Loll  [place]  [facility]  Medical Director of the Advanced Lipid Disorders &  Cardiovascular Risk Reduction Clinic Diplomate of the American Board of Clinical Lipidology Attending Cardiologist  Direct Dial: [phone]  Fax: [phone]  Website:  [email or web address] Nicely Jacquelyn Shadrick 03/27/2023, 3:07 PM

## 2023-03-27 NOTE — Telephone Encounter (Signed)
-----   Message from Nurse Eileen Stanford E sent at 03/27/2023  2:47 PM EST ----- Regarding: Repatha PA Hello team,   This patient needs a PA for Repatha Sureclick. PAD, CAD, heart failure, not at goal on zetia, statin intolerant (tried atorvastatin 80, quote "tried them all")  Thanks

## 2023-03-28 DIAGNOSIS — M158 Other polyosteoarthritis: Secondary | ICD-10-CM | POA: Insufficient documentation

## 2023-03-28 DIAGNOSIS — I110 Hypertensive heart disease with heart failure: Secondary | ICD-10-CM | POA: Insufficient documentation

## 2023-03-28 DIAGNOSIS — Z8711 Personal history of peptic ulcer disease: Secondary | ICD-10-CM | POA: Insufficient documentation

## 2023-03-28 DIAGNOSIS — G4700 Insomnia, unspecified: Secondary | ICD-10-CM | POA: Insufficient documentation

## 2023-03-28 HISTORY — DX: Hypertensive heart disease with heart failure: I11.0

## 2023-04-01 NOTE — Progress Notes (Unsigned)
Cardiology Office Note:    Date:  04/02/2023   ID:  Kenneth Bailey, DOB Jul 03, 1965, MRN [id number]  PCP:  Yvonne Kendall, NP  Cardiologist:  Norman Herrlich, MD    Referring MD: [name], MD    ASSESSMENT:    1. Chest pain, unspecified type   2. CAD in native artery   3. Chronic combined systolic and diastolic heart failure (HCC)   4. Stage 3b chronic kidney disease (HCC)   5. Mixed hyperlipidemia    PLAN:    In order of problems listed above:  Difficult to tell if his symptoms are predominantly GI with a history of ulcer disease or cardiac Will do an EKG in the office today If reassuring set up for a myocardial perfusion study with his CKD I am going to stop aspirin and continue a single antiplatelet agent Overall doing better heart failure is compensated continue his current once a day medications for now Check renal function and potassium Continue his statin and check lipid profile and CMP   Next appointment: 6 weeks   Medication Adjustments/Labs and Tests Ordered: Current medicines are reviewed at length with the patient today.  Concerns regarding medicines are outlined above.  Orders Placed This Encounter  Procedures   EKG 12-Lead   No orders of the defined types were placed in this encounter.    History of Present Illness:    Kenneth Bailey is a 57 y.o. male with a hx of CAD with CABG January 2023 heart failure reduced ejection fraction initially 25 to 30% subsequently improving to 45 to 50% iron deficiency anemia stage III CKD and ongoing cigarette smoking last seen 01/12/2023.  Other findings include peripheral arterial disease previous hyperkalemia hyperlipidemia and cystic lesion pancreas on CT scan.  He was seen [facility] health emergency department [facility] 01/11/2023 for edema laboratory studies showed a hemoglobin of 8.7 white count elevated 13,600 creatinine 1.79 with a GFR 44 cc/min and his chest x-ray did not show findings of heart  failure.   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   He had EGD performed with argon coagulation hemostat and clip placement during a recent [facility] at that time with melena anemia required transfusion and was found to have nonbleeding gastric and duodenal ulcers.   Compliance with diet, lifestyle and medications: No  On his own he decided to reduce all medicines to once a day and he never filled the prescription for [facility] with hyperkalemia Overall he feels better more strength and endurance He is having substernal chest pain that he feels is more indigestion than angina he tried nitroglycerin without relief and is even using baking soda and water which does help The symptom is nagging recurrent but not exertional in nature He cut his diuretic to once a day and has no edema shortness of breath he has had no palpitation or syncope. He tolerates his lipid-lowering without muscle pain or weakness Past Medical History:  Diagnosis Date   Acute combined systolic and diastolic heart failure (HCC)    Anemia    CHF (congestive heart failure) (HCC)    COPD (chronic obstructive pulmonary disease) (HCC)    Diabetes mellitus, type 2 (HCC)    Gastric ulcer    HLD (hyperlipidemia)    Hypertension    Kidney disease, chronic, stage III (GFR 30-59 ml/min) (HCC)    Multiple duodenal ulcers  Orthostatic hypotension    Osteomyelitis (HCC)    PUD (peptic ulcer disease)    Tobacco abuse     Current Medications: Current Meds  Medication Sig   aspirin EC 81 MG tablet Take 1 tablet (81 mg total) by mouth daily. Swallow whole.   clopidogrel (PLAVIX) 75 MG tablet Take 1 tablet (75 mg total) by mouth daily.   dapagliflozin propanediol (FARXIGA) 10 MG TABS tablet Take 1 tablet (10 mg total) by mouth daily before breakfast.   furosemide (LASIX) 20 MG tablet Take 1 tablet (20 mg total) by mouth 2  (two) times daily.   gabapentin (NEURONTIN) 600 MG tablet Take 600 mg by mouth daily as needed (nerve pain). Patient reports taking twice daily   gemfibrozil (LOPID) 600 MG tablet Take 600 mg by mouth 2 (two) times daily before a meal.   insulin aspart (NOVOLOG) 100 UNIT/ML injection Inject 0-9 Units into the skin 3 (three) times daily with meals. CBG < 70: Implement Hypoglycemia Standing Orders CBG 70 - 120: 0 units CBG 121 - 150: 1 unit CBG 151 - 200: 2 units CBG 201 - 250: 3 units CBG 251 - 300: 5 units CBG 301 - 350: 7 units CBG 351 - 400: 9 units CBG > 400: call MD and obtain STAT lab verification   Insulin Syringe-Needle U-100 30G X 1/2" 0.3 ML MISC Use with Novolog (Patient taking differently: 1 each by Other route daily.)   Melatonin 10 MG TABS Take 10 mg by mouth at bedtime.   metFORMIN (GLUCOPHAGE) 1000 MG tablet Take 1,000 mg by mouth 2 (two) times daily.   mupirocin ointment (BACTROBAN) 2 % Apply 1 Application topically daily.   polyethylene glycol (MIRALAX / GLYCOLAX) 17 g packet Take 17 g by mouth daily as needed for mild constipation.   potassium chloride SA (KLOR-CON M) 20 MEQ tablet Take 1 tablet (20 mEq total) by mouth daily.   sacubitril-valsartan (ENTRESTO) 24-26 MG Take 1 tablet by mouth 2 (two) times daily.   SEMGLEE, YFGN, 100 UNIT/ML Pen Inject 100 Units into the skin once a week.   senna-docusate (SENOKOT-S) 8.6-50 MG tablet Take 1 tablet by mouth 2 (two) times daily.   sodium zirconium cyclosilicate (LOKELMA) 10 g PACK packet Take 10 g by mouth 3 (three) times daily.   spironolactone (ALDACTONE) 25 MG tablet Take 25 mg by mouth once.   Varenicline Tartrate, Starter, (CHANTIX STARTING MONTH PAK) 0.5 MG X 11 & 1 MG X 42 TBPK Take 0.5 mg by mouth as directed.   [DISCONTINUED] acetaminophen (TYLENOL) 325 MG tablet Take 2 tablets (650 mg total) by mouth every 6 (six) hours as needed for mild pain, fever or headache.   [DISCONTINUED] ezetimibe (ZETIA) 10 MG tablet Take 1 tablet  (10 mg total) by mouth daily.   [DISCONTINUED] oxyCODONE (OXY IR/ROXICODONE) 5 MG immediate release tablet Take 5 mg by mouth every 6 (six) hours as needed for breakthrough pain or moderate pain.      EKGs/Labs/Other Studies Reviewed:    The following studies were reviewed today:  Cardiac Studies & Procedures   CARDIAC CATHETERIZATION  CARDIAC CATHETERIZATION 06/06/2021  Narrative   Ost LAD to Prox LAD lesion is 90% stenosed.   Ramus lesion is 90% stenosed.   Prox RCA lesion is 95% stenosed.   Prox RCA to Mid RCA lesion is 80% stenosed.   Dist RCA lesion is 50% stenosed.   RPDA lesion is 90% stenosed.   Mid LM to Dist LM lesion is  50% stenosed.   Ost Cx to Prox Cx lesion is 40% stenosed.   1st Mrg lesion is 99% stenosed.   Mid Cx lesion is 50% stenosed.   2nd Diag lesion is 90% stenosed.   Mid LAD lesion is 70% stenosed.   Dist LAD lesion is 50% stenosed.   The left ventricular ejection fraction is 25-35% by visual estimate.  Findings:  Ao = 99/62 (84) LV = 107/11 RA =  5 RV = 30/6 PA = 29/9 (19) PCW = 12 Fick cardiac output/index = 4.7/2.5 PVR = 1.5 WU FA sat = 97% PA sat = 69%, 71% SVC sat = 69%  Assessment: Severe 3v CAD Ischemic CM EF ~25-30% Well compensated filling pressures  Plan/Discussion:  Will need CABG evaluation.  Arvilla Meres, MD 1:11 PM  Findings Coronary Findings Diagnostic  Dominance: Right  Left Main Mid LM to Dist LM lesion is 50% stenosed.  Left Anterior Descending Ost LAD to Prox LAD lesion is 90% stenosed. Mid LAD lesion is 70% stenosed. Dist LAD lesion is 50% stenosed.  Second Diagonal Branch 2nd Diag lesion is 90% stenosed.  Ramus Intermedius Ramus lesion is 90% stenosed.  Left Circumflex Ost Cx to Prox Cx lesion is 40% stenosed. Mid Cx lesion is 50% stenosed.  First Obtuse Marginal Branch 1st Mrg lesion is 99% stenosed.  Right Coronary Artery Prox RCA lesion is 95% stenosed. Prox RCA to Mid RCA lesion is  80% stenosed. Dist RCA lesion is 50% stenosed.  Right Posterior Descending Artery RPDA lesion is 90% stenosed.  Intervention  No interventions have been documented.     ECHOCARDIOGRAM  ECHOCARDIOGRAM COMPLETE 09/12/2022  Narrative ECHOCARDIOGRAM REPORT    Patient Name:   Kenneth Bailey [facility] Date of Exam: 09/12/2022 Medical Rec #:  [id number]           Height:       68.0 in Accession #:    [id number]          Weight:       167.8 lb Date of Birth:  1965-07-23            BSA:          1.897 m Patient Age:    56 years            BP:           110/70 mmHg Patient Gender: M                   HR:           92 bpm. Exam Location:  [place]  Procedure: 2D Echo, Cardiac Doppler, Color Doppler and Strain Analysis  Indications:    Chronic systolic heart failure [I50.22 (ICD-10-CM)]  History:        Patient has prior history of Echocardiogram examinations, most recent 09/21/2021. CHF, CAD, Prior CABG, COPD; Risk Factors:Hypertension, Dyslipidemia and Current Smoker.  Sonographer:    Margreta Journey RDCS Referring Phys: [id number] DANIEL R BENSIMHON  IMPRESSIONS   1. Left ventricular ejection fraction, by estimation, is 45 to 50%. The left ventricle has mildly decreased function. The left ventricle has no regional wall motion abnormalities. Left ventricular diastolic parameters are consistent with Grade I diastolic dysfunction (impaired relaxation). The average left ventricular global longitudinal strain is -17.7 %. The global longitudinal strain is abnormal. 2. TAPSE 1.5. Right ventricular systolic function is mildly reduced. The right ventricular size is normal. 3. The mitral valve is normal in structure. No evidence of mitral valve  regurgitation. No evidence of mitral stenosis. 4. The aortic valve is tricuspid. Aortic valve regurgitation is not visualized. No aortic stenosis is present. 5. Aortic Normal DTA. 6. The inferior vena cava is normal in size with greater than 50% respiratory  variability, suggesting right atrial pressure of 3 mmHg.  FINDINGS Left Ventricle: Left ventricular ejection fraction, by estimation, is 45 to 50%. The left ventricle has mildly decreased function. The left ventricle has no regional wall motion abnormalities. The average left ventricular global longitudinal strain is -17.7 %. The global longitudinal strain is abnormal. The left ventricular internal cavity size was normal in size. There is borderline left ventricular hypertrophy. Left ventricular diastolic parameters are consistent with Grade I diastolic dysfunction (impaired relaxation). Normal left ventricular filling pressure.  Right Ventricle: TAPSE 1.5. The right ventricular size is normal. No increase in right ventricular wall thickness. Right ventricular systolic function is mildly reduced.  Left Atrium: Left atrial size was normal in size.  Right Atrium: Right atrial size was normal in size.  Pericardium: There is no evidence of pericardial effusion.  Mitral Valve: The mitral valve is normal in structure. No evidence of mitral valve regurgitation. No evidence of mitral valve stenosis.  Tricuspid Valve: The tricuspid valve is normal in structure. Tricuspid valve regurgitation is not demonstrated. No evidence of tricuspid stenosis.  Aortic Valve: The aortic valve is tricuspid. Aortic valve regurgitation is not visualized. No aortic stenosis is present.  Pulmonic Valve: The pulmonic valve was normal in structure. Pulmonic valve regurgitation is not visualized. No evidence of pulmonic stenosis.  Aorta: The aortic root and ascending aorta are structurally normal, with no evidence of dilitation, Normal DTA and the aortic arch was not well visualized.  Venous: A normal flow pattern is recorded from the right upper pulmonary vein. The inferior vena cava is normal in size with greater than 50% respiratory variability, suggesting right atrial pressure of 3 mmHg.  IAS/Shunts: No atrial level  shunt detected by color flow Doppler.   LEFT VENTRICLE PLAX 2D LVIDd:         5.00 cm   Diastology LVIDs:         3.90 cm   LV e' medial:    7.22 cm/s LV PW:         1.10 cm   LV E/e' medial:  9.6 LV IVS:        1.00 cm   LV e' lateral:   10.63 cm/s LVOT diam:     1.70 cm   LV E/e' lateral: 6.5 LVOT Area:     2.27 cm 2D Longitudinal Strain 2D Strain GLS Avg:     -17.7 %  RIGHT VENTRICLE             IVC RV Basal diam:  2.60 cm     IVC diam: 1.10 cm RV Mid diam:    2.40 cm RV S prime:     10.43 cm/s TAPSE (M-mode): 1.5 cm  LEFT ATRIUM             Index        RIGHT ATRIUM           Index LA diam:        2.10 cm 1.11 cm/m   RA Area:     11.60 cm LA Vol (A2C):   48.8 ml 25.73 ml/m  RA Volume:   23.20 ml  12.23 ml/m LA Vol (A4C):   41.7 ml 21.98 ml/m LA Biplane Vol: 46.2 ml 24.36 ml/m  AORTA Ao Root diam: 3.30 cm Ao Asc diam:  3.50 cm Ao Desc diam: 1.90 cm  MITRAL VALVE MV Area (PHT): 4.25 cm     SHUNTS MV Decel Time: 179 msec     Systemic Diam: 1.70 cm MV E velocity: 69.53 cm/s MV A velocity: 111.00 cm/s MV E/A ratio:  0.63  Norman Herrlich MD Electronically signed by Norman Herrlich MD Signature Date/Time: 09/12/2022/8:31:30 PM    Final   TEE  ECHO INTRAOPERATIVE TEE 06/10/2021  Narrative *INTRAOPERATIVE TRANSESOPHAGEAL REPORT *    Patient Name:   Kenneth Bailey [facility] Date of Exam: 06/10/2021 Medical Rec #:  [id number]           Height:       68.0 in Accession #:    [id number]          Weight:       166.0 lb Date of Birth:  04-19-1966            BSA:          1.89 m Patient Age:    55 years            BP:           143/88 mmHg Patient Gender: M                   HR:           72 bpm. Exam Location:  Anesthesiology  Transesophogeal exam was perform intraoperatively during surgical procedure. Patient was closely monitored under general anesthesia during the entirety of examination.  Indications:     Coronary Artery Disease Sonographer:     Eulah Pont  RDCS Performing Phys: [id number] PETER VANTRIGT Diagnosing Phys: [name] MD  Complications: No known complications during this procedure. POST-OP IMPRESSIONS _ Left Ventricle: The left ventricle is unchanged from pre-bypass. _ Right Ventricle: The right ventricle appears unchanged from pre-bypass. _ Aorta: The aorta appears unchanged from pre-bypass. _ Left Atrial Appendage: The left atrial appendage appears unchanged from pre-bypass. _ Aortic Valve: The aortic valve appears unchanged from pre-bypass. _ Mitral Valve: The mitral valve appears unchanged from pre-bypass. _ Tricuspid Valve: The tricuspid valve appears unchanged from pre-bypass. _ Pulmonic Valve: The pulmonic valve appears unchanged from pre-bypass.  PRE-OP FINDINGS Left Ventricle: The left ventricle has severely reduced systolic function, with an ejection fraction of 20-30%. The cavity size was moderately dilated.   Right Ventricle: The right ventricle has normal systolic function. The cavity was normal. There is no increase in right ventricular wall thickness.  Left Atrium: Left atrial size was normal in size. No left atrial/left atrial appendage thrombus was detected.  Right Atrium: Right atrial size was normal in size. Moble, filamentous structure noted arrising from IVC and into Right atrium. Discussed wtih Dr. Maren Beach.  Interatrial Septum: Evidence of atrial level shunting detected by color flow Doppler. A small patent foramen ovale is detected with predominantly left to right shunting across the atrial septum.  Pericardium: There is no evidence of pericardial effusion.  Mitral Valve: The mitral valve is normal in structure. Mitral valve regurgitation is trivial by color flow Doppler. There is no evidence of mitral valve vegetation.  Tricuspid Valve: The tricuspid valve was normal in structure. Tricuspid valve regurgitation is trivial by color flow Doppler.  Aortic Valve: The aortic valve is tricuspid Aortic  valve regurgitation was not visualized by color flow Doppler. There is no stenosis of the aortic valve. There is no evidence of aortic valve vegetation.  Pulmonic Valve: The pulmonic valve was normal in structure. Pulmonic valve regurgitation is trivial by color flow Doppler.   Aorta: There is evidence of plaque in the descending aorta; Grade II, measuring 2-5mm in size.  +-------------+--------++ AORTIC VALVE          +-------------+--------++ AV Mean Grad:6.0 mmHg +-------------+--------++   [name] MD Electronically signed by [name] MD Signature Date/Time: 06/10/2021/3:40:03 PM    Final                Recent Labs: 11/17/2022: Magnesium 1.7 01/11/2023: B Natriuretic Peptide 211.4 01/23/2023: ALT 12 01/30/2023: BUN 36; Creatinine, Ser 1.65; Potassium 5.8; Sodium 140 03/07/2023: Hemoglobin 13.9; Platelets 219  Recent Lipid Panel    Component Value Date/Time   CHOL 148 11/04/2022 0123   TRIG 253 (H) 11/04/2022 0123   HDL 21 (L) 11/04/2022 0123   CHOLHDL 7.0 11/04/2022 0123   VLDL 51 (H) 11/04/2022 0123   LDLCALC 76 11/04/2022 0123   LDLDIRECT 81 09/04/2022 1546   EKG Interpretation Date/Time:  Monday April 02 2023 13:42:23 EST Ventricular Rate:  91 PR Interval:  148 QRS Duration:  94 QT Interval:  368 QTC Calculation: 452 R Axis:   56  Text Interpretation: Normal sinus rhythm Nonspecific ST and T wave abnormality When compared with ECG of 12-Jan-2023 09:05, No significant change was found Confirmed by Norman Herrlich ([id number]) on 04/02/2023 1:51:40 PM   Physical Exam:    VS:  BP 120/78 (BP Location: Left Arm, Patient Position: Sitting)   Pulse 97   Ht 5\' 6"  (1.676 m)   Wt 174 lb 9.6 oz (79.2 kg)   SpO2 98%   BMI 28.18 kg/m     Wt Readings from Last 3 Encounters:  04/02/23 174 lb 9.6 oz (79.2 kg)  03/27/23 172 lb (78 kg)  03/07/23 173 lb 12.8 oz (78.8 kg)     GEN:  Well nourished, well developed in no acute distress HEENT:  Normal NECK: No JVD; No carotid bruits LYMPHATICS: No lymphadenopathy CARDIAC: RRR, no murmurs, rubs, gallops RESPIRATORY:  Clear to auscultation without rales, wheezing or rhonchi  ABDOMEN: Soft, non-tender, non-distended MUSCULOSKELETAL:  No edema; No deformity  SKIN: Warm and dry NEUROLOGIC:  Alert and oriented x 3 PSYCHIATRIC:  Normal affect    Signed, Norman Herrlich, MD  04/02/2023 1:17 PM    [facility]

## 2023-04-02 ENCOUNTER — Encounter: Payer: Self-pay | Admitting: Cardiology

## 2023-04-02 ENCOUNTER — Ambulatory Visit: Payer: Medicaid Other | Attending: Cardiology | Admitting: Cardiology

## 2023-04-02 VITALS — BP 120/78 | HR 97 | Ht 66.0 in | Wt 174.6 lb

## 2023-04-02 DIAGNOSIS — N1832 Chronic kidney disease, stage 3b: Secondary | ICD-10-CM

## 2023-04-02 DIAGNOSIS — I5042 Chronic combined systolic (congestive) and diastolic (congestive) heart failure: Secondary | ICD-10-CM | POA: Diagnosis not present

## 2023-04-02 DIAGNOSIS — R079 Chest pain, unspecified: Secondary | ICD-10-CM | POA: Diagnosis not present

## 2023-04-02 DIAGNOSIS — I2510 Atherosclerotic heart disease of native coronary artery without angina pectoris: Secondary | ICD-10-CM | POA: Diagnosis not present

## 2023-04-02 DIAGNOSIS — E782 Mixed hyperlipidemia: Secondary | ICD-10-CM

## 2023-04-02 NOTE — Patient Instructions (Addendum)
Medication Instructions:  Your physician has recommended you make the following change in your medication:   STOP: Aspirin STOP: Lokelma  *If you need a refill on your cardiac medications before your next appointment, please call your pharmacy*   Lab Work: Your physician recommends that you return for lab work in:   Labs today: CMP, Lipids  If you have labs (blood work) drawn today and your tests are completely normal, you will receive your results only by: MyChart Message (if you have MyChart) OR A paper copy in the mail If you have any lab test that is abnormal or we need to change your treatment, we will call you to review the results.   Testing/Procedures:   [facility] [address] [place], [place] [address] Phone:  [phone]    Please arrive 15 minutes prior to your appointment time for registration and insurance purposes.  The test will take approximately 3 to 4 hours to complete; you may bring reading material.  If someone comes with you to your appointment, they will need to remain in the main lobby due to limited space in the testing area. **If you are pregnant or breastfeeding, please notify the nuclear lab prior to your appointment**  How to prepare for your Myocardial Perfusion Test: Do not eat or drink 3 hours prior to your test, except you may have water. Do not consume products containing caffeine (regular or decaffeinated) 12 hours prior to your test. (ex: coffee, chocolate, sodas, tea). Do bring a list of your current medications with you.  If not listed below, you may take your medications as normal. HOLD diabetic medication/insulin the morning of the test. Take half of long acting insulin the night before test. Do wear comfortable clothes (no dresses or overalls) and walking shoes, tennis shoes preferred (No heels or open toe shoes are allowed). Do NOT wear cologne, perfume, aftershave, or lotions (deodorant is allowed). If  these instructions are not followed, your test will have to be rescheduled.  Please report to [address] for your test.  If you have questions or concerns about your appointment, you can call the [facility] at [phone].  If you cannot keep your appointment, please provide 24 hours notification to the Nuclear Lab, to avoid a possible $50 charge to your account.    Follow-Up: At [facility], you and your health needs are our priority.  As part of our continuing mission to provide you with exceptional heart care, we have created designated Provider Care Teams.  These Care Teams include your primary Cardiologist (physician) and Advanced Practice Providers (APPs -  Physician Assistants and Nurse Practitioners) who all work together to provide you with the care you need, when you need it.  We recommend signing up for the patient portal called "MyChart".  Sign up information is provided on this After Visit Summary.  MyChart is used to connect with patients for Virtual Visits (Telemedicine).  Patients are able to view lab/test results, encounter notes, upcoming appointments, etc.  Non-urgent messages can be sent to your provider as well.   To learn more about what you can do with MyChart, go to [email or web address].    Your next appointment:   6 week(s)  Provider:   [name], NP  Other Instructions None

## 2023-04-02 NOTE — Addendum Note (Signed)
Addended by: Norman Herrlich on: 04/02/2023 04:50 PM   Modules accepted: Orders

## 2023-04-03 ENCOUNTER — Other Ambulatory Visit (HOSPITAL_COMMUNITY): Payer: Self-pay

## 2023-04-03 ENCOUNTER — Telehealth: Payer: Self-pay

## 2023-04-03 ENCOUNTER — Other Ambulatory Visit: Payer: Self-pay

## 2023-04-03 LAB — COMPREHENSIVE METABOLIC PANEL
ALT: 12 [IU]/L (ref 0–44)
AST: 15 [IU]/L (ref 0–40)
Albumin: 4.2 g/dL (ref 3.8–4.9)
Alkaline Phosphatase: 175 [IU]/L — ABNORMAL HIGH (ref 44–121)
BUN/Creatinine Ratio: 24 — ABNORMAL HIGH (ref 9–20)
BUN: 27 mg/dL — ABNORMAL HIGH (ref 6–24)
Bilirubin Total: 0.3 mg/dL (ref 0.0–1.2)
CO2: 22 mmol/L (ref 20–29)
Calcium: 9.3 mg/dL (ref 8.7–10.2)
Chloride: 99 mmol/L (ref 96–106)
Creatinine, Ser: 1.12 mg/dL (ref 0.76–1.27)
Globulin, Total: 2.6 g/dL (ref 1.5–4.5)
Glucose: 317 mg/dL — ABNORMAL HIGH (ref 70–99)
Potassium: 4.9 mmol/L (ref 3.5–5.2)
Sodium: 138 mmol/L (ref 134–144)
Total Protein: 6.8 g/dL (ref 6.0–8.5)
eGFR: 77 mL/min/{1.73_m2} (ref >59)

## 2023-04-03 LAB — LIPID PANEL
Chol/HDL Ratio: 9.5 ratio — ABNORMAL HIGH (ref 0.0–5.0)
Cholesterol, Total: 171 mg/dL (ref 100–199)
HDL: 18 mg/dL — ABNORMAL LOW (ref >39)
LDL Chol Calc (NIH): 58 mg/dL (ref 0–99)
Triglycerides: 633 mg/dL (ref 0–149)
VLDL Cholesterol Cal: 95 mg/dL — ABNORMAL HIGH (ref 5–40)

## 2023-04-03 NOTE — Telephone Encounter (Signed)
Spoke with the patient, detailed instructions given. He stated that he would be here for his test. Asked to call back with any questions.S.Buena Boehm CCT

## 2023-04-04 ENCOUNTER — Other Ambulatory Visit (HOSPITAL_COMMUNITY): Payer: Self-pay

## 2023-04-05 ENCOUNTER — Ambulatory Visit: Payer: Medicaid Other | Attending: Cardiology

## 2023-04-05 DIAGNOSIS — I2510 Atherosclerotic heart disease of native coronary artery without angina pectoris: Secondary | ICD-10-CM

## 2023-04-05 DIAGNOSIS — N1832 Chronic kidney disease, stage 3b: Secondary | ICD-10-CM | POA: Diagnosis not present

## 2023-04-05 DIAGNOSIS — R079 Chest pain, unspecified: Secondary | ICD-10-CM | POA: Diagnosis not present

## 2023-04-05 DIAGNOSIS — I5042 Chronic combined systolic (congestive) and diastolic (congestive) heart failure: Secondary | ICD-10-CM

## 2023-04-05 DIAGNOSIS — E782 Mixed hyperlipidemia: Secondary | ICD-10-CM

## 2023-04-05 MED ORDER — TECHNETIUM TC 99M TETROFOSMIN IV KIT
9.6000 | PACK | Freq: Once | INTRAVENOUS | Status: AC | PRN
  Administered 2023-04-05: 9.6 via INTRAVENOUS

## 2023-04-05 MED ORDER — REGADENOSON 0.4 MG/5ML IV SOLN
0.4000 mg | Freq: Once | INTRAVENOUS | Status: AC
  Administered 2023-04-05: 0.4 mg via INTRAVENOUS

## 2023-04-05 MED ORDER — TECHNETIUM TC 99M TETROFOSMIN IV KIT
32.6000 | PACK | Freq: Once | INTRAVENOUS | Status: AC | PRN
  Administered 2023-04-05: 32.6 via INTRAVENOUS

## 2023-04-05 NOTE — Telephone Encounter (Signed)
Pharmacy Patient Advocate Encounter  Received notification from [facility] that Prior Authorization for repatha has been APPROVED from 03/30/23 to 03/29/24   PA #/Case ID/Reference #: [id number]

## 2023-04-06 LAB — MYOCARDIAL PERFUSION IMAGING
LV dias vol: 117 mL (ref 62–150)
LV sys vol: 74 mL
Nuc Stress EF: 37 %
Peak HR: 97 {beats}/min
Rest HR: 85 {beats}/min
Rest Nuclear Isotope Dose: 9.6 mCi
SDS: 1
SRS: 3
SSS: 4
ST Depression (mm): 0 mm
Stress Nuclear Isotope Dose: 32.6 mCi
TID: 1.21

## 2023-04-06 MED ORDER — REPATHA SURECLICK 140 MG/ML ~~LOC~~ SOAJ: 140.0000 mg | SUBCUTANEOUS | 11 refills | Status: DC

## 2023-04-06 NOTE — Addendum Note (Signed)
Addended by: Lindell Spar on: 04/06/2023 01:59 PM   Modules accepted: Orders

## 2023-04-06 NOTE — Telephone Encounter (Signed)
Patient aware med approved. Rx(s) sent to pharmacy electronically.

## 2023-04-10 ENCOUNTER — Other Ambulatory Visit: Payer: Self-pay

## 2023-04-10 DIAGNOSIS — I255 Ischemic cardiomyopathy: Secondary | ICD-10-CM

## 2023-04-10 DIAGNOSIS — I2510 Atherosclerotic heart disease of native coronary artery without angina pectoris: Secondary | ICD-10-CM

## 2023-04-23 ENCOUNTER — Encounter: Payer: Self-pay | Admitting: Podiatry

## 2023-04-30 ENCOUNTER — Ambulatory Visit (INDEPENDENT_AMBULATORY_CARE_PROVIDER_SITE_OTHER): Payer: Medicaid Other | Admitting: Podiatry

## 2023-04-30 ENCOUNTER — Encounter: Payer: Self-pay | Admitting: Podiatry

## 2023-04-30 DIAGNOSIS — Z89422 Acquired absence of other left toe(s): Secondary | ICD-10-CM

## 2023-04-30 DIAGNOSIS — L97524 Non-pressure chronic ulcer of other part of left foot with necrosis of bone: Secondary | ICD-10-CM

## 2023-04-30 DIAGNOSIS — E11621 Type 2 diabetes mellitus with foot ulcer: Secondary | ICD-10-CM

## 2023-04-30 DIAGNOSIS — M86472 Chronic osteomyelitis with draining sinus, left ankle and foot: Secondary | ICD-10-CM

## 2023-04-30 DIAGNOSIS — Z9889 Other specified postprocedural states: Secondary | ICD-10-CM

## 2023-04-30 DIAGNOSIS — I739 Peripheral vascular disease, unspecified: Secondary | ICD-10-CM

## 2023-04-30 MED ORDER — DOXYCYCLINE HYCLATE 100 MG PO TABS: 100.0000 mg | ORAL_TABLET | Freq: Two times a day (BID) | ORAL | 0 refills | Status: AC

## 2023-04-30 NOTE — Progress Notes (Signed)
Subjective:  Patient ID: Kenneth Bailey, male    DOB: 09-21-1965,  MRN: [id number]  Chief Complaint  Patient presents with   Routine Post Op    Post op care - removal of 5th toe. Still hurts when lays in bed. No drainage. Recent MRI, need to discuss further surgery option. DM    DOS: 11/15/2022 Procedure: Partial fifth ray amputation left foot initially performed by Dr. Ralene Cork and then completed by Dr. Loreta Ave and revision surgery completed by Dr. Logan Bores with total amputation of the 4th ray.  57 y.o. male returns for post-op check.  He is now 16  weeks postop on his fifth and fourth ray amputation closure.  Still having nonhealing wound at the lateral aspect of the fourth metatarsal head on the left foot having some pain at the area.  MRI from [place] did not indicate osteomyelitis.  Patient states he is following up with his vascular surgeon again in January.  Review of Systems: Negative except as noted in the HPI. Denies N/V/F/Ch.   Objective:  There were no vitals filed for this visit. There is no height or weight on file to calculate BMI. Constitutional Well developed. Well nourished.  Vascular Weightbearing.  Findings: Warmth within normal limits to lower extremity.  Nonpalpable pedal pulses left foot. Capillary refill intact to all digits.  Calf is soft and supple, no posterior calf or knee pain, negative Homans' sign  Neurologic Normal speech. Oriented to person, place, and time. Epicritic sensation to light touch grossly present bilaterally.  Dermatologic ulceration of the lateral aspect base of 4th toe.  Upon debridement of overlying fibrotic slough there the wound probes to capsule.  Wound measures 0.3 x 0.3 x 0.3 cm predebridement, 0.6 x 0.4 x 0.3 cm postdebridement.  Increased granulation tissue with healthy bleeding base noted postdebridement.  No significant erythema maceration or odor present from the wound.     Orthopedic: Tenderness to palpation noted about the  ulceration   Multiple view plain film radiographs: 03/26/2023 XR 3 3 views AP lateral oblique of the left foot weightbearing.  Findings: There is been prior resection of the fourth metatarsal head and fourth ray.  No obvious evidence of erosion or osteolysis in the fourth proximal phalanx base.  No soft tissue emphysema noted. Assessment:   1. 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 Type 2 diabetes mellitus with foot ulcer (CODE) (HCC)   4. Peripheral arterial disease with history of revascularization (HCC)   5. 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  S/p foot surgery left, residual ulceration to the level of bone with concern for osteomyelitis of the underlying metatarsal head -MRI findings discussed with patient.  Concern remains due to deep probing of the wound - Will keep patient on oral doxycycline for now to keep this area stable.  -WB Status: WBAT in regular shoe gear, felt offloading horseshoe pad applied to the left foot -Continue home dressing changes  3 times weekly with Prisma and dry gauze dressing   Procedure: Excisional Debridement of Wound Rationale: Removal of non-viable soft tissue from the wound to promote healing.  Anesthesia: none Post-Debridement Wound Measurements: 0.6 cm x 0.4 cm x 0.3 cm  Type of Debridement: Sharp Excisional Tissue Removed: Non-viable soft tissue Depth of Debridement: subcutaneous tissue. Technique:  Sharp excisional debridement to bleeding, viable wound base.  Dressing: Dry, sterile, compression dressing. Disposition: Patient tolerated procedure well.       Return in about 2 weeks (around 05/14/2023) for Wound Care.         Bronwen Betters, D.P.M. [facility]

## 2023-05-08 ENCOUNTER — Ambulatory Visit: Payer: Medicaid Other | Attending: Cardiology

## 2023-05-08 DIAGNOSIS — I255 Ischemic cardiomyopathy: Secondary | ICD-10-CM

## 2023-05-08 DIAGNOSIS — I2510 Atherosclerotic heart disease of native coronary artery without angina pectoris: Secondary | ICD-10-CM | POA: Diagnosis not present

## 2023-05-08 LAB — ECHOCARDIOGRAM COMPLETE
AR max vel: 1.6 cm2
AV Area VTI: 1.66 cm2
AV Area mean vel: 1.61 cm2
AV Mean grad: 5.5 mm[Hg]
AV Peak grad: 10.2 mm[Hg]
Ao pk vel: 1.6 m/s
Area-P 1/2: 5.75 cm2
S' Lateral: 4.1 cm

## 2023-05-09 ENCOUNTER — Encounter: Payer: Self-pay | Admitting: Cardiology

## 2023-05-13 NOTE — Progress Notes (Signed)
 Cardiology Office Note:  .   Date:  05/14/2023  ID:  Kenneth Bailey, DOB 05-06-1966, MRN [id number] PCP: Yvonne Kendall, NP  [facility] Providers Cardiologist:  Norman Herrlich, MD    History of Present Illness: .    Kenneth Bailey is a 57 y.o. male with a past medical history of systolic heart failure, coronary artery disease s/p CABG x 4 in 2023, COPD, PUD, PAD, DM2, dyslipidemia, upper GI bleed, nicotine dependence.  2023 CABG x 4 06/06/2021 left heart cath severe three-vessel CAD 10/11/2021 renal artery ultrasound without evidence of stenosis.  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 09/12/2022 echocardiogram EF 45 to 50%, grade 1 DD 10/30/2022 ABIs right is abnormal, left indicates moderate arterial disease 04/05/2023 Lexiscan, mild ischemia involving basal portion of the inferior wall, intermediate risk secondary to EF >> recommendations for repeat echo 05/08/2023 echo EF 55 to 60%, grade 1 DD, no valvular abnormalities  Evaluated by Dr. Dulce Sellar on 01/12/2023 after being evaluated in the emergency department the previous day for edema, hemoglobin noted to be 8.7, WBCs 13.6, creatinine 1.79.  He was also started on Chantix to help with smoking cessation.  He was evaluated in the emergency department on 01/17/2023 for orthostatic hypotension, his Lasix had been recently changed to twice daily felt this was the culprit for his hypotension.  He was then admitted to [facility] on 01/21/2023 with AKI, with instructions to stop his Lopid.  Most recently evaluated by Dr. Dulce Sellar on 04/02/2023, he was having some episodes of chest pain however it is hard to decipher if it was related to GI/history of PUD. Lexiscan was arranged which revealed no ischemia by decreased EF >> echo revealed EF 55-60%.   He presents today for follow-up of his CAD.  He reports he is feeling well, offers no formal complaints from a cardiac perspective.  He has stopped most of his medications  including Plavix, Farxiga, Repatha, Lopid, Entresto, spironolactone, Chantix-- and attributes this to the reason why he is feeling good.  He does continue to smoke, although is still trying to cut back. He denies chest pain, palpitations, dyspnea, pnd, orthopnea, n, v, dizziness, syncope, edema, weight gain, or early satiety.   ROS: Review of Systems  Musculoskeletal:  Positive for joint pain.  All other systems reviewed and are negative.    Studies Reviewed: .        Cardiac Studies & Procedures   CARDIAC CATHETERIZATION  CARDIAC CATHETERIZATION 06/06/2021  Narrative   Ost LAD to Prox LAD lesion is 90% stenosed.   Ramus lesion is 90% stenosed.   Prox RCA lesion is 95% stenosed.   Prox RCA to Mid RCA lesion is 80% stenosed.   Dist RCA lesion is 50% stenosed.   RPDA lesion is 90% stenosed.   Mid LM to Dist LM lesion is 50% stenosed.   Ost Cx to Prox Cx lesion is 40% stenosed.   1st Mrg lesion is 99% stenosed.   Mid Cx lesion is 50% stenosed.   2nd Diag lesion is 90% stenosed.   Mid LAD lesion is 70% stenosed.   Dist LAD lesion is 50% stenosed.   The left ventricular ejection fraction is 25-35% by visual estimate.  Findings:  Ao = 99/62 (84) LV = 107/11 RA =  5 RV = 30/6 PA = 29/9 (19) PCW = 12 Fick cardiac output/index = 4.7/2.5 PVR = 1.5 WU FA sat = 97% PA sat = 69%, 71% SVC sat =  69%  Assessment: Severe 3v CAD Ischemic CM EF ~25-30% Well compensated filling pressures  Plan/Discussion:  Will need CABG evaluation.  Arvilla Meres, MD 1:11 PM  Findings Coronary Findings Diagnostic  Dominance: Right  Left Main Mid LM to Dist LM lesion is 50% stenosed.  Left Anterior Descending Ost LAD to Prox LAD lesion is 90% stenosed. Mid LAD lesion is 70% stenosed. Dist LAD lesion is 50% stenosed.  Second Diagonal Branch 2nd Diag lesion is 90% stenosed.  Ramus Intermedius Ramus lesion is 90% stenosed.  Left Circumflex Ost Cx to Prox Cx lesion is 40%  stenosed. Mid Cx lesion is 50% stenosed.  First Obtuse Marginal Branch 1st Mrg lesion is 99% stenosed.  Right Coronary Artery Prox RCA lesion is 95% stenosed. Prox RCA to Mid RCA lesion is 80% stenosed. Dist RCA lesion is 50% stenosed.  Right Posterior Descending Artery RPDA lesion is 90% stenosed.  Intervention  No interventions have been documented.   STRESS TESTS  MYOCARDIAL PERFUSION IMAGING 04/05/2023  Narrative   Findings are consistent with mild ischemia involving basal portion of the inferior wall. The study is intermediate risk secondary to diminished EF.   No ST deviation was noted.   Left ventricular function is abnormal. Global function is moderately reduced. Nuclear stress EF: 37%. The left ventricular ejection fraction is moderately decreased (30-44%). End diastolic cavity size is normal.   Prior study not available for comparison.  ECHOCARDIOGRAM  ECHOCARDIOGRAM COMPLETE 05/08/2023  Narrative ECHOCARDIOGRAM REPORT    Patient Name:   Kenneth Bailey [facility] Date of Exam: 05/08/2023 Medical Rec #:  [id number]           Height:       66.0 in Accession #:    [id number]          Weight:       174.0 lb Date of Birth:  Oct 09, 1965            BSA:          1.885 m Patient Age:    57 years            BP:           120/78 mmHg Patient Gender: M                   HR:           96 bpm. Exam Location:  [place]  Procedure: 2D Echo, Cardiac Doppler, Color Doppler and Strain Analysis  Indications:    CAD in native artery [I25.10 (ICD-10-CM)]; Ischemic cardiomyopathy [I25.5 (ICD-10-CM)]  History:        Patient has prior history of Echocardiogram examinations, most recent 09/12/2022. Cardiomyopathy and CHF, CAD, Prior CABG; Signs/Symptoms:Chest Pain.  Sonographer:    Margreta Journey RDCS Referring Phys: [id number] BRIAN J MUNLEY  IMPRESSIONS   1. Left ventricular ejection fraction, by estimation, is 55 to 60%. The left ventricle has normal function. The left ventricle  has no regional wall motion abnormalities. Left ventricular diastolic parameters are consistent with Grade I diastolic dysfunction (impaired relaxation). GLS-12.9% 2. Right ventricular systolic function is normal. The right ventricular size is normal. 3. The mitral valve is normal in structure. No evidence of mitral valve regurgitation. No evidence of mitral stenosis. 4. The aortic valve is normal in structure. Aortic valve regurgitation is not visualized. No aortic stenosis is present. 5. The inferior vena cava is normal in size with greater than 50% respiratory variability, suggesting right atrial pressure of 3 mmHg.  FINDINGS  Left Ventricle: Left ventricular ejection fraction, by estimation, is 50 to 55%. The left ventricle has low normal function. The left ventricle has no regional wall motion abnormalities. The left ventricular internal cavity size was normal in size. There is no left ventricular hypertrophy. Left ventricular diastolic parameters are consistent with Grade I diastolic dysfunction (impaired relaxation).  Right Ventricle: The right ventricular size is normal. No increase in right ventricular wall thickness. Right ventricular systolic function is normal.  Left Atrium: Left atrial size was normal in size.  Right Atrium: Right atrial size was normal in size.  Pericardium: There is no evidence of pericardial effusion.  Mitral Valve: The mitral valve is normal in structure. No evidence of mitral valve regurgitation. No evidence of mitral valve stenosis.  Tricuspid Valve: The tricuspid valve is normal in structure. Tricuspid valve regurgitation is not demonstrated. No evidence of tricuspid stenosis.  Aortic Valve: The aortic valve is normal in structure. Aortic valve regurgitation is not visualized. No aortic stenosis is present. Aortic valve mean gradient measures 5.5 mmHg. Aortic valve peak gradient measures 10.2 mmHg. Aortic valve area, by VTI measures 1.66 cm.  Pulmonic  Valve: The pulmonic valve was normal in structure. Pulmonic valve regurgitation is not visualized. No evidence of pulmonic stenosis.  Aorta: The aortic root is normal in size and structure.  Venous: The inferior vena cava is normal in size with greater than 50% respiratory variability, suggesting right atrial pressure of 3 mmHg.  IAS/Shunts: No atrial level shunt detected by color flow Doppler.   LEFT VENTRICLE PLAX 2D LVIDd:         4.90 cm   Diastology LVIDs:         4.10 cm   LV e' medial:    6.85 cm/s LV PW:         1.10 cm   LV E/e' medial:  10.0 LV IVS:        0.80 cm   LV e' lateral:   7.18 cm/s LVOT diam:     1.90 cm   LV E/e' lateral: 9.5 LV SV:         45 LV SV Index:   24 LVOT Area:     2.84 cm   RIGHT VENTRICLE            IVC RV Basal diam:  2.80 cm    IVC diam: 1.35 cm RV Mid diam:    2.60 cm RV S prime:     9.68 cm/s TAPSE (M-mode): 1.7 cm  LEFT ATRIUM             Index        RIGHT ATRIUM           Index LA diam:        2.80 cm 1.49 cm/m   RA Area:     13.60 cm LA Vol (A2C):   37.2 ml 19.74 ml/m  RA Volume:   29.90 ml  15.86 ml/m LA Vol (A4C):   57.1 ml 30.29 ml/m LA Biplane Vol: 46.3 ml 24.56 ml/m AORTIC VALVE AV Area (Vmax):    1.60 cm AV Area (Vmean):   1.61 cm AV Area (VTI):     1.66 cm AV Vmax:           160.00 cm/s AV Vmean:          108.500 cm/s AV VTI:            0.272 m AV Peak Grad:  10.2 mmHg AV Mean Grad:      5.5 mmHg LVOT Vmax:         90.57 cm/s LVOT Vmean:        61.433 cm/s LVOT VTI:          0.159 m LVOT/AV VTI ratio: 0.59  AORTA Ao Root diam: 3.20 cm Ao Asc diam:  3.20 cm Ao Desc diam: 1.80 cm  MITRAL VALVE MV Area (PHT): 5.75 cm     SHUNTS MV Decel Time: 132 msec     Systemic VTI:  0.16 m MV E velocity: 68.30 cm/s   Systemic Diam: 1.90 cm MV A velocity: 107.50 cm/s MV E/A ratio:  0.64  Belva Crome MD Electronically signed by Belva Crome MD Signature Date/Time: 05/08/2023/3:55:15 PM    Final   TEE  ECHO INTRAOPERATIVE TEE 06/10/2021  Narrative *INTRAOPERATIVE TRANSESOPHAGEAL REPORT *    Patient Name:   Kenneth Bailey [facility] Date of Exam: 06/10/2021 Medical Rec #:  [id number]           Height:       68.0 in Accession #:    [id number]          Weight:       166.0 lb Date of Birth:  1966-03-29            BSA:          1.89 m Patient Age:    55 years            BP:           143/88 mmHg Patient Gender: M                   HR:           72 bpm. Exam Location:  Anesthesiology  Transesophogeal exam was perform intraoperatively during surgical procedure. Patient was closely monitored under general anesthesia during the entirety of examination.  Indications:     Coronary Artery Disease Sonographer:     Eulah Pont RDCS Performing Phys: [id number] PETER VANTRIGT Diagnosing Phys: [name] MD  Complications: No known complications during this procedure. POST-OP IMPRESSIONS _ Left Ventricle: The left ventricle is unchanged from pre-bypass. _ Right Ventricle: The right ventricle appears unchanged from pre-bypass. _ Aorta: The aorta appears unchanged from pre-bypass. _ Left Atrial Appendage: The left atrial appendage appears unchanged from pre-bypass. _ Aortic Valve: The aortic valve appears unchanged from pre-bypass. _ Mitral Valve: The mitral valve appears unchanged from pre-bypass. _ Tricuspid Valve: The tricuspid valve appears unchanged from pre-bypass. _ Pulmonic Valve: The pulmonic valve appears unchanged from pre-bypass.  PRE-OP FINDINGS Left Ventricle: The left ventricle has severely reduced systolic function, with an ejection fraction of 20-30%. The cavity size was moderately dilated.   Right Ventricle: The right ventricle has normal systolic function. The cavity was normal. There is no increase in right ventricular wall thickness.  Left Atrium: Left atrial size was normal in size. No left atrial/left atrial appendage thrombus was detected.  Right Atrium: Right atrial  size was normal in size. Moble, filamentous structure noted arrising from IVC and into Right atrium. Discussed wtih Dr. Maren Beach.  Interatrial Septum: Evidence of atrial level shunting detected by color flow Doppler. A small patent foramen ovale is detected with predominantly left to right shunting across the atrial septum.  Pericardium: There is no evidence of pericardial effusion.  Mitral Valve: The mitral valve is normal in structure. Mitral valve regurgitation is trivial by color flow Doppler. There is  no evidence of mitral valve vegetation.  Tricuspid Valve: The tricuspid valve was normal in structure. Tricuspid valve regurgitation is trivial by color flow Doppler.  Aortic Valve: The aortic valve is tricuspid Aortic valve regurgitation was not visualized by color flow Doppler. There is no stenosis of the aortic valve. There is no evidence of aortic valve vegetation.   Pulmonic Valve: The pulmonic valve was normal in structure. Pulmonic valve regurgitation is trivial by color flow Doppler.   Aorta: There is evidence of plaque in the descending aorta; Grade II, measuring 2-17mm in size.  +-------------+--------++ AORTIC VALVE          +-------------+--------++ AV Mean Grad:6.0 mmHg +-------------+--------++   [name] MD Electronically signed by [name] MD Signature Date/Time: 06/10/2021/3:40:03 PM    Final            Risk Assessment/Calculations:             Physical Exam:   VS:  BP (!) 122/58   Pulse 98   Ht 5\' 8"  (1.727 m)   Wt 177 lb (80.3 kg)   SpO2 96%   BMI 26.91 kg/m    Wt Readings from Last 3 Encounters:  05/14/23 177 lb (80.3 kg)  04/05/23 174 lb (78.9 kg)  04/02/23 174 lb 9.6 oz (79.2 kg)    GEN: Well nourished, well developed in no acute distress NECK: No JVD; No carotid bruits CARDIAC: RRR, no murmurs, rubs, gallops RESPIRATORY:  Clear to auscultation without rales, wheezing or rhonchi  ABDOMEN: Soft, non-tender,  non-distended EXTREMITIES:  No edema; No deformity   ASSESSMENT AND PLAN: .   Coronary artery disease- s/p CABG x 4 in 2023. Stable with no anginal symptoms. No indication for ischemic evaluation.  He has previously stopped most of his cardiac related medications as outlined above in the HPI, this is been an ongoing issue where he will start them and then stop them.  He was agreeable to restart Plavix 75 mg daily.  Ischemic cardiomyopathy-most recent echo revealed normalization of his EF.  NYHA class I, euvolemic.  Again, he is stopped most of his medications on his own volition and is not agreeable to restarting them at this time.  Previously was on Farxiga, Entresto, spironolactone--which she is all stopped and not agreeable to restarting.  PAD-follows with vascular, has repeat ABIs and office visit with them on 05/29/2023.  Tobacco abuse-previously on Chantix however is no longer taking this, continues to smoke.  Not interested in cessation at this time.  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 He is very dependent on caffeine as he used this to stop his ETOH use.   Dyslipidemia -most recent LDL was well-controlled at 58 however he has since stopped taking Repatha and Lopid, not interested in restarting at this time.  He does have follow-up with the lipid clinic, although I am not sure if he will keep this appointment or not.  DM2 with insulin dependency -states since he has stopped all of his cardiac medications his blood sugar is better controlled.       Dispo: Restart Plavix 75 mg daily.  Follow-up with general cardiology in 6 months.  Signed, Flossie Dibble, NP

## 2023-05-14 ENCOUNTER — Ambulatory Visit: Payer: Medicaid Other | Attending: Cardiology | Admitting: Cardiology

## 2023-05-14 ENCOUNTER — Ambulatory Visit: Payer: Medicaid Other | Admitting: Podiatry

## 2023-05-14 ENCOUNTER — Encounter: Payer: Self-pay | Admitting: Cardiology

## 2023-05-14 VITALS — BP 122/58 | HR 98 | Ht 68.0 in | Wt 177.0 lb

## 2023-05-14 DIAGNOSIS — Z951 Presence of aortocoronary bypass graft: Secondary | ICD-10-CM

## 2023-05-14 DIAGNOSIS — I5032 Chronic diastolic (congestive) heart failure: Secondary | ICD-10-CM | POA: Diagnosis not present

## 2023-05-14 DIAGNOSIS — I2510 Atherosclerotic heart disease of native coronary artery without angina pectoris: Secondary | ICD-10-CM | POA: Diagnosis not present

## 2023-05-14 DIAGNOSIS — Z794 Long term (current) use of insulin: Secondary | ICD-10-CM

## 2023-05-14 DIAGNOSIS — I5020 Unspecified systolic (congestive) heart failure: Secondary | ICD-10-CM

## 2023-05-14 DIAGNOSIS — Z720 Tobacco use: Secondary | ICD-10-CM | POA: Diagnosis not present

## 2023-05-14 DIAGNOSIS — E119 Type 2 diabetes mellitus without complications: Secondary | ICD-10-CM

## 2023-05-14 DIAGNOSIS — I739 Peripheral vascular disease, unspecified: Secondary | ICD-10-CM

## 2023-05-14 DIAGNOSIS — F1510 Other stimulant abuse, uncomplicated: Secondary | ICD-10-CM

## 2023-05-14 MED ORDER — CLOPIDOGREL BISULFATE 75 MG PO TABS: 75.0000 mg | ORAL_TABLET | Freq: Every day | ORAL | 3 refills | Status: DC

## 2023-05-14 NOTE — Patient Instructions (Signed)
Medication Instructions:  Your physician has recommended you make the following change in your medication:   START: Plavix 75 mg daily  *If you need a refill on your cardiac medications before your next appointment, please call your pharmacy*   Lab Work: None If you have labs (blood work) drawn today and your tests are completely normal, you will receive your results only by: MyChart Message (if you have MyChart) OR A paper copy in the mail If you have any lab test that is abnormal or we need to change your treatment, we will call you to review the results.   Testing/Procedures: None   Follow-Up: At [facility], you and your health needs are our priority.  As part of our continuing mission to provide you with exceptional heart care, we have created designated Provider Care Teams.  These Care Teams include your primary Cardiologist (physician) and Advanced Practice Providers (APPs -  Physician Assistants and Nurse Practitioners) who all work together to provide you with the care you need, when you need it.  We recommend signing up for the patient portal called "MyChart".  Sign up information is provided on this After Visit Summary.  MyChart is used to connect with patients for Virtual Visits (Telemedicine).  Patients are able to view lab/test results, encounter notes, upcoming appointments, etc.  Non-urgent messages can be sent to your provider as well.   To learn more about what you can do with MyChart, go to [email or web address].    Your next appointment:   6 month(s)  Provider:   [name], NP [facility])    Other Instructions None

## 2023-05-28 ENCOUNTER — Ambulatory Visit: Payer: Medicaid Other | Admitting: Podiatry

## 2023-05-28 ENCOUNTER — Ambulatory Visit (INDEPENDENT_AMBULATORY_CARE_PROVIDER_SITE_OTHER): Payer: Medicaid Other

## 2023-05-28 DIAGNOSIS — M778 Other enthesopathies, not elsewhere classified: Secondary | ICD-10-CM

## 2023-05-28 DIAGNOSIS — M722 Plantar fascial fibromatosis: Secondary | ICD-10-CM

## 2023-05-28 DIAGNOSIS — M7752 Other enthesopathy of left foot: Secondary | ICD-10-CM

## 2023-05-28 MED ORDER — TRIAMCINOLONE ACETONIDE 10 MG/ML IJ SUSP
5.0000 mg | Freq: Once | INTRAMUSCULAR | Status: AC
  Administered 2023-05-28: 5 mg

## 2023-05-28 NOTE — Patient Instructions (Signed)
 More silicone pads can be purchased from:  [email or web address]   Use Right foot dancer's pad  And Donut/aperture callus pad or horsehoe pad for outside of left foot

## 2023-05-28 NOTE — Progress Notes (Signed)
  Subjective:  Patient ID: Kenneth Bailey, male    DOB: May 24, 1965,  MRN: [id number]  Chief Complaint  Patient presents with   Diabetic Ulcer    Diabetic ulcer left forefoot. Left lat side, looks like it is healing. New spot on the midfoot, knot present. Sore to touch. A1c 8.1, plavix     58 y.o. male returns for wound care to fourth and fifth ray amputation site left foot.  Doing well.  Has been using the Prisma as directed.  This appears well-healed.  He does have appointment with vascular later this month.  He is complaining of some pain along the arch today.  States that he has a nodule present.  Review of Systems: Negative except as noted in the HPI. Denies N/V/F/Ch.   Objective:  There were no vitals filed for this visit. There is no height or weight on file to calculate BMI. Constitutional Well developed. Well nourished.  Vascular Weightbearing.  Findings: Warmth within normal limits to lower extremity.  Nonpalpable pedal pulses left foot. Capillary refill intact to all digits.  Calf is soft and supple, no posterior calf or knee pain, negative Homans' sign  Neurologic Normal speech. Oriented to person, place, and time. Epicritic sensation to light touch grossly present bilaterally.  Dermatologic Previous ulceration to the lateral aspects of fourth and fifth ray resection site at base of the fourth toe appears healed with stable overlying scab.  This was left intact.  No surrounding erythema, swelling, drainage or signs of infection present. Firm nodule present to the medial band of plantar fascia along distal to the level of the 1st TMTJ, tender on palpation    Orthopedic: Prior fourth and fifth ray amputation site. No gaps in plantar fascia Nodule in plantar fascia as stated above No pain on palpation of plantar medial calcaneal tubercle or plantar heel centrally   Multiple view plain film radiographs: 03/26/2023 XR 3 3 views AP lateral oblique of the left foot  weightbearing.  Findings: There is been prior resection of the fourth metatarsal head and fourth ray.  No obvious evidence of erosion or osteolysis in the fourth proximal phalanx base.  No soft tissue emphysema noted.  New radiographs were ordered on 05/28/2023 however images cannot be saved due to ongoing technical difficulties Assessment:   1. Capsulitis of left foot   2. Plantar fascial fibromatosis     Plan:  Patient was evaluated and treated and all questions answered.  Plan: -Ulceration at amputation site appears well-healed. - May begin to return to diabetic shoes for this -Discussed etiology and formation of plantar fibroma.  Verbal consent obtained to proceed with corticosteroid injection to this lesion -Dancers pad was applied to the lesion following injection which was tolerated well. -Follow-up in about 4 weeks for the plantar fibroma and to monitor the ulceration site.  New diabetic shoes at this time if eligible  Procedure: Injection Tendon/Ligament Discussed alternatives, risks, complications and verbal consent was obtained.  Location: Left plantar fibroma nodule. Skin Prep: Alcohol. Injectate: 0.5 cc 0.5% marcaine  plain, 0.5 cc kenalog   Disposition: Patient tolerated procedure well. Injection site dressed with a band-aid.  Post-injection care was discussed and return precautions discussed.        Return in about 4 weeks (around 06/25/2023) for Left Plantar Fibroma.         Ethan Saddler, D.P.M. [facility] / [facility]

## 2023-05-29 ENCOUNTER — Ambulatory Visit (INDEPENDENT_AMBULATORY_CARE_PROVIDER_SITE_OTHER)
Admission: RE | Admit: 2023-05-29 | Discharge: 2023-05-29 | Disposition: A | Discharge status: Home / Self Care | Payer: Medicaid Other | Source: Ambulatory Visit | Attending: Vascular Surgery

## 2023-05-29 ENCOUNTER — Ambulatory Visit (HOSPITAL_COMMUNITY)
Admission: RE | Admit: 2023-05-29 | Discharge: 2023-05-29 | Disposition: A | Discharge status: Home / Self Care | Payer: Medicaid Other | Source: Ambulatory Visit | Attending: Vascular Surgery | Admitting: Vascular Surgery

## 2023-05-29 ENCOUNTER — Ambulatory Visit (INDEPENDENT_AMBULATORY_CARE_PROVIDER_SITE_OTHER): Payer: Medicaid Other | Admitting: Physician Assistant

## 2023-05-29 VITALS — BP 132/82 | HR 101 | Temp 97.9°F | Resp 20 | Ht 68.0 in | Wt 172.6 lb

## 2023-05-29 DIAGNOSIS — I739 Peripheral vascular disease, unspecified: Secondary | ICD-10-CM

## 2023-05-29 LAB — VAS US ABI WITH/WO TBI
Left ABI: 0.69
Right ABI: 0.59

## 2023-05-29 NOTE — Progress Notes (Signed)
 [facility]   History of Present Illness:  Patient is a 58 y.o. 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    He is followed closely by his DPM.  He denies claudication, new non healing wounds and rest pain.  He does have peripheral neuropathy and has significant decreased sensation in B feet.    He stopped taking his medication because he just wasn't feeling well. He stopped his Plavix , Statin and hypertensive medications.  He is now back on Repatha ,  Plavix  and DM medications.  He continues to be an everyday smoker.    Past Medical History:  Diagnosis Date   Acute combined systolic and diastolic heart failure (HCC)    Acute systolic heart failure (HCC) 06/06/2021   Anemia    Atherosclerotic heart disease 09/07/2021   CHF (congestive heart failure) (HCC)    Chronic systolic CHF (congestive heart failure) (HCC) 11/23/2022   COPD (chronic obstructive pulmonary disease) (HCC)    Diabetes mellitus, type 2 (HCC)    Gastric ulcer    HLD (hyperlipidemia)    Hypertension    Hypertension associated with diabetes (HCC) 09/29/2022   Hypertensive heart disease with congestive heart failure (HCC) 03/28/2023   Kidney disease, chronic, stage III (GFR 30-59 ml/min) (HCC)    Multiple duodenal ulcers    Orthostatic hypotension    Osteomyelitis (HCC)    Peripheral arterial disease (HCC)    Postural hypotension 12/20/2016   PUD (peptic ulcer disease)    Tobacco abuse     Past Surgical History:  Procedure Laterality Date   ABDOMINAL AORTOGRAM W/LOWER EXTREMITY N/A 11/03/2022   Procedure: ABDOMINAL AORTOGRAM W/LOWER EXTREMITY;  Surgeon: Gretta Lonni PARAS, MD;  Location: [facility];  Service: Cardiovascular;  Laterality: N/A;   AMPUTATION Left 10/31/2022   Procedure: AMPUTATION  LEFT FIFTH TOE;  Surgeon: Joya Stabs, DPM;  Location: [facility] OR;  Service: Podiatry;  Laterality: Left;   AMPUTATION Left 11/15/2022   Procedure: AMPUTATION FITH RAY;  Surgeon: Janit Thresa HERO, DPM;  Location: [facility] OR;  Service: Podiatry;  Laterality: Left;   AMPUTATION TOE Left 10/31/2022   Procedure: IRRIGATION AND DEBRIDEMENTOF LEFT FOOT;  Surgeon: Joya Stabs, DPM;  Location: [facility] OR;  Service: Podiatry;  Laterality: Left;   BIOPSY  11/05/2022   Procedure: BIOPSY;  Surgeon: Rollin Dover, MD;  Location: [facility] ENDOSCOPY;  Service: Gastroenterology;;   CORONARY ARTERY BYPASS GRAFT N/A 06/10/2021   Procedure: CORONARY ARTERY BYPASS GRAFTING (CABG) TIMES FOUR, USING LEFT INTERNAL MAMMARY ARTERY AND RIGHT GREATER SAPHENOUS VEIN HARVESTED ENDOSCOPICALLY;  Surgeon: Obadiah Coy, MD;  Location: [facility] OR;  Service: Open Heart Surgery;  Laterality: N/A;   ENDOVEIN HARVEST OF GREATER SAPHENOUS VEIN Right 06/10/2021   Procedure: ENDOVEIN HARVEST OF GREATER SAPHENOUS VEIN;  Surgeon: Obadiah Coy, MD;  Location: [facility] OR;  Service: Open Heart Surgery;  Laterality: Right;   ESOPHAGOGASTRODUODENOSCOPY N/A 11/24/2022   Procedure: ESOPHAGOGASTRODUODENOSCOPY (EGD);  Surgeon: Wilhelmenia Aloha Raddle., MD;  Location: [facility] ENDOSCOPY;  Service: Gastroenterology;  Laterality: N/A;   ESOPHAGOGASTRODUODENOSCOPY (EGD) WITH PROPOFOL  N/A 11/05/2022   Procedure: ESOPHAGOGASTRODUODENOSCOPY (EGD) WITH PROPOFOL ;  Surgeon: Rollin Dover, MD;  Location: [facility] ENDOSCOPY;  Service: Gastroenterology;  Laterality: N/A;   HEMOSTASIS CLIP PLACEMENT  11/24/2022   Procedure: HEMOSTASIS CLIP PLACEMENT;  Surgeon: Wilhelmenia Aloha Raddle., MD;  Location: [facility] ENDOSCOPY;  Service: Gastroenterology;;   HEMOSTASIS CONTROL  11/24/2022   Procedure: HEMOSTASIS CONTROL;  Surgeon: Wilhelmenia Aloha Raddle., MD;  Location: [facility] ENDOSCOPY;  Service: Gastroenterology;;   HOT HEMOSTASIS N/A 11/24/2022   Procedure: HOT HEMOSTASIS (ARGON PLASMA COAGULATION/BICAP);  Surgeon:  Wilhelmenia Aloha Raddle., MD;  Location: [facility] ENDOSCOPY;  Service: Gastroenterology;  Laterality: N/A;   I & D EXTREMITY Left 11/08/2022   Procedure: IRRIGATION AND DEBRIDEMENT OF FOOT AND WASHOUT;  Surgeon: Gershon Donnice SAUNDERS, DPM;  Location: [facility] OR;  Service: Podiatry;  Laterality: Left;   PERIPHERAL VASCULAR BALLOON ANGIOPLASTY  11/03/2022   Procedure: PERIPHERAL VASCULAR BALLOON ANGIOPLASTY;  Surgeon: Gretta Lonni PARAS, MD;  Location: [facility];  Service: Cardiovascular;;   RIGHT/LEFT HEART CATH AND CORONARY ANGIOGRAPHY N/A 06/06/2021   Procedure: RIGHT/LEFT HEART CATH AND CORONARY ANGIOGRAPHY;  Surgeon: Cherrie Toribio SAUNDERS, MD;  Location: [facility];  Service: Cardiovascular;  Laterality: N/A;   TEE WITHOUT CARDIOVERSION N/A 06/10/2021   Procedure: TRANSESOPHAGEAL ECHOCARDIOGRAM (TEE);  Surgeon: Obadiah Coy, MD;  Location: [facility] OR;  Service: Open Heart Surgery;  Laterality: N/A;    ROS:   General:  No weight loss, Fever, chills  HEENT: No recent headaches, no nasal bleeding, no visual changes, no sore throat  Neurologic: No dizziness, blackouts, seizures. No recent symptoms of stroke or mini- stroke. No recent episodes of slurred speech, or temporary blindness.  Cardiac: No recent episodes of chest pain/pressure, no shortness of breath at rest.  No shortness of breath with exertion.  Denies history of atrial fibrillation or irregular heartbeat  Vascular: No history of rest pain in feet.  No history of claudication.  positive history of non-healing ulcer, No history of DVT   Pulmonary: No home oxygen, no productive cough, no hemoptysis,  No asthma or wheezing  Musculoskeletal:  [ ]  Arthritis, [ ]  Low back pain,  [ ]  Joint pain  Hematologic:No history of hypercoagulable state.  No history of easy bleeding.  No history of anemia  Gastrointestinal: No hematochezia or melena,  No gastroesophageal reflux, no trouble swallowing  Urinary: [ ]  chronic Kidney disease, [ ]  on  HD - [ ]  MWF or [ ]  TTHS, [ ]  Burning with urination, [ ]  Frequent urination, [ ]  Difficulty urinating;   Skin: No rashes  Psychological: No history of anxiety,  No history of depression  Social History Social History   Tobacco Use   Smoking status: Every Day    Current packs/day: 1.00    Average packs/day: 1 pack/day for 51.3 years (51.3 ttl pk-yrs)    Types: Cigarettes    Start date: 01/23/1972    Passive exposure: Never   Smokeless tobacco: Never   Tobacco comments:    Trying to quit with Chantix -12/2022  Vaping Use   Vaping status: Never Used  Substance Use Topics   Alcohol use: Yes    Comment: occasionally beer every other weekend   Drug use: Never    Family History Family History  Problem Relation Age of Onset   Hypertension Mother    Cancer Mother    Heart disease Father     Allergies  Allergies  Allergen Reactions   Lisinopril     Leg swelling   Statins     Leg swelling     Current Outpatient Medications  Medication Sig Dispense Refill   clopidogrel  (PLAVIX ) 75 MG tablet Take 1 tablet (75 mg total) by mouth daily. 90 tablet 3   gabapentin  (NEURONTIN ) 600 MG tablet Take 600 mg  by mouth daily as needed (nerve pain). Patient reports taking twice daily     insulin  aspart (NOVOLOG ) 100 UNIT/ML injection Inject 0-9 Units into the skin 3 (three) times daily with meals. CBG < 70: Implement Hypoglycemia Standing Orders CBG 70 - 120: 0 units CBG 121 - 150: 1 unit CBG 151 - 200: 2 units CBG 201 - 250: 3 units CBG 251 - 300: 5 units CBG 301 - 350: 7 units CBG 351 - 400: 9 units CBG > 400: call MD and obtain STAT lab verification 10 mL 0   Insulin  Syringe-Needle U-100 30G X 1/2 0.3 ML MISC Use with Novolog  90 each 0   metFORMIN (GLUCOPHAGE) 1000 MG tablet Take 1,000 mg by mouth 2 (two) times daily.     mupirocin  ointment (BACTROBAN ) 2 % Apply 1 Application topically daily. 22 g 0   polyethylene glycol (MIRALAX  / GLYCOLAX ) 17 g packet Take 17 g by mouth daily as needed  for mild constipation. 14 each 0   SEMGLEE , YFGN, 100 UNIT/ML Pen Inject 100 Units into the skin once a week.     senna-docusate (SENOKOT-S) 8.6-50 MG tablet Take 1 tablet by mouth 2 (two) times daily. 30 tablet 0   ferrous sulfate  325 (65 FE) MG tablet Take 325 mg by mouth daily. (Patient not taking: Reported on 05/29/2023)     furosemide  (LASIX ) 20 MG tablet Take 1 tablet (20 mg total) by mouth 2 (two) times daily. (Patient not taking: Reported on 05/29/2023) 180 tablet 0   Study - DAPA TIMI 68 - dapagliflozin  (FARXIGA ) 10 mg or placebo tablet (PI-Dalton McLean) Take 1 tablet by mouth in the morning and at bedtime. (Patient not taking: Reported on 05/29/2023)     No current facility-administered medications for this visit.    Physical Examination  Vitals:   05/29/23 1332  BP: 132/82  Pulse: (!) 101  Resp: 20  Temp: 97.9 F (36.6 C)  TempSrc: Temporal  SpO2: 98%  Weight: 172 lb 9.6 oz (78.3 kg)  Height: 5' 8 (1.727 m)    Body mass index is 26.24 kg/m.  General:  Alert and oriented, no acute distress HEENT: Normal Neck: No bruit or JVD Pulmonary: Clear to auscultation bilaterally Cardiac: Regular Rate and Rhythm without murmur Abdomen: Soft, non-tender, non-distended, no mass, no scars Skin: No rash Extremity Pulses:    The later amputation site has healed, with scab/dry skin.  No erythema or edema Musculoskeletal: No deformity or edema  Neurologic: Upper and lower extremity motor 5/5 and symmetric  DATA:  ABI Findings:  +---------+------------------+-----+----------+--------+  Right   Rt Pressure (mmHg)IndexWaveform  Comment   +---------+------------------+-----+----------+--------+  Brachial 150                                        +---------+------------------+-----+----------+--------+  PTA     82                0.55 monophasic          +---------+------------------+-----+----------+--------+  DP      89                0.59 monophasic           +---------+------------------+-----+----------+--------+  Great Toe55                0.37                     +---------+------------------+-----+----------+--------+   +---------+------------------+-----+----------+-------+  Left    Lt Pressure (mmHg)IndexWaveform  Comment  +---------+------------------+-----+----------+-------+  Brachial 150                                       +---------+------------------+-----+----------+-------+  PTA     104               0.69 biphasic           +---------+------------------+-----+----------+-------+  DP      88                0.59 monophasic         +---------+------------------+-----+----------+-------+  Great Toe55                0.37                    +---------+------------------+-----+----------+-------+   +-------+-----------+-----------+------------+------------+  ABI/TBIToday's ABIToday's TBIPrevious ABIPrevious TBI  +-------+-----------+-----------+------------+------------+  Right 0.59       0.37       0.68        0.29          +-------+-----------+-----------+------------+------------+  Left  0.69       0.37       1.09        0.25          +-------+-----------+-----------+------------+------------+      Right ABIs appear decreased compared to prior study on 12/11/2022. Left ABI  appears decreased from the last exam.    Summary:  Right: Resting right ankle-brachial index indicates moderate right lower  extremity arterial disease. The right toe-brachial index is abnormal.   Left: Resting left ankle-brachial index indicates moderate left lower  extremity arterial disease. The left toe-brachial index is abnormal.   +----------+--------+-----+--------+--------+--------+  LEFT     PSV cm/sRatioStenosisWaveformComments  +----------+--------+-----+--------+--------+--------+  CFA Distal84                   biphasic           +----------+--------+-----+--------+--------+--------+  SFA Prox  65                   biphasic          +----------+--------+-----+--------+--------+--------+  SFA Mid   69                   biphasic          +----------+--------+-----+--------+--------+--------+  SFA Distal36                   biphasic          +----------+--------+-----+--------+--------+--------+  POP Prox  33                   biphasic          +----------+--------+-----+--------+--------+--------+  POP Distal23                   biphasic          +----------+--------+-----+--------+--------+--------+  ATA Distal32                   biphasic          +----------+--------+-----+--------+--------+--------+  PTA Distal30                   biphasic          +----------+--------+-----+--------+--------+--------+    Summary:  Left: Patent without evidence of hemodynamically significant  stenosis.        ASSESSMENT/PLAN:  PAD with non healing lateral foot incision 5th MTP amputation.   He has healed the incision, there is a small callous/scab as pictured above, no erythema or edema.  His arterial flow has biphasic wave forms, there is no stenosis noted on the duplex.  The velocities have decreased from the distal SFA throughout the tibials native vessels.  He has no symptoms of ischemia and no new non healing wounds. We reviewed symptoms of ischemia and if these occur he will call our office.  Otherwise he will f/u in 6 months with repeat studies.  He will exercise and ambulate as he tolerates, continue Plavix  and Repatha .         Maurilio Deland Collet PA-C [facility] [place] Office: [phone]  MD in clinic [place]

## 2023-05-31 ENCOUNTER — Inpatient Hospital Stay: Payer: Medicaid Other

## 2023-05-31 ENCOUNTER — Inpatient Hospital Stay: Payer: Medicaid Other | Attending: Hematology and Oncology | Admitting: Oncology

## 2023-05-31 VITALS — BP 148/94 | HR 90 | Temp 98.3°F | Resp 18 | Ht 68.0 in | Wt 174.7 lb

## 2023-05-31 DIAGNOSIS — E1122 Type 2 diabetes mellitus with diabetic chronic kidney disease: Secondary | ICD-10-CM | POA: Diagnosis not present

## 2023-05-31 DIAGNOSIS — F17210 Nicotine dependence, cigarettes, uncomplicated: Secondary | ICD-10-CM | POA: Diagnosis not present

## 2023-05-31 DIAGNOSIS — N183 Chronic kidney disease, stage 3 unspecified: Secondary | ICD-10-CM | POA: Diagnosis not present

## 2023-05-31 DIAGNOSIS — N1831 Chronic kidney disease, stage 3a: Secondary | ICD-10-CM

## 2023-05-31 DIAGNOSIS — I130 Hypertensive heart and chronic kidney disease with heart failure and stage 1 through stage 4 chronic kidney disease, or unspecified chronic kidney disease: Secondary | ICD-10-CM | POA: Diagnosis not present

## 2023-05-31 DIAGNOSIS — K922 Gastrointestinal hemorrhage, unspecified: Secondary | ICD-10-CM | POA: Insufficient documentation

## 2023-05-31 DIAGNOSIS — D500 Iron deficiency anemia secondary to blood loss (chronic): Secondary | ICD-10-CM | POA: Diagnosis present

## 2023-05-31 DIAGNOSIS — Z720 Tobacco use: Secondary | ICD-10-CM

## 2023-05-31 LAB — CBC WITH DIFFERENTIAL/PLATELET
Abs Immature Granulocytes: 0.03 10*3/uL (ref 0.00–0.07)
Basophils Absolute: 0.1 10*3/uL (ref 0.0–0.1)
Basophils Relative: 1 %
Eosinophils Absolute: 0.1 10*3/uL (ref 0.0–0.5)
Eosinophils Relative: 2 %
HCT: 46 % (ref 39.0–52.0)
Hemoglobin: 15.7 g/dL (ref 13.0–17.0)
Immature Granulocytes: 0 %
Lymphocytes Relative: 14 %
Lymphs Abs: 1.1 10*3/uL (ref 0.7–4.0)
MCH: 28.4 pg (ref 26.0–34.0)
MCHC: 34.1 g/dL (ref 30.0–36.0)
MCV: 83.2 fL (ref 80.0–100.0)
Monocytes Absolute: 0.6 10*3/uL (ref 0.1–1.0)
Monocytes Relative: 7 %
Neutro Abs: 5.8 10*3/uL (ref 1.7–7.7)
Neutrophils Relative %: 76 %
Platelets: 232 10*3/uL (ref 150–400)
RBC: 5.53 MIL/uL (ref 4.22–5.81)
RDW: 13.4 % (ref 11.5–15.5)
WBC: 7.7 10*3/uL (ref 4.0–10.5)
nRBC: 0 % (ref 0.0–0.2)
nRBC: 0 /100{WBCs}

## 2023-05-31 LAB — COMPREHENSIVE METABOLIC PANEL
ALT: 12 U/L (ref 0–44)
AST: 17 U/L (ref 15–41)
Albumin: 4.4 g/dL (ref 3.5–5.0)
Alkaline Phosphatase: 147 U/L — ABNORMAL HIGH (ref 38–126)
Anion gap: 13 (ref 5–15)
BUN: 14 mg/dL (ref 6–20)
CO2: 27 mmol/L (ref 22–32)
Calcium: 10 mg/dL (ref 8.9–10.3)
Chloride: 100 mmol/L (ref 98–111)
Creatinine, Ser: 1.11 mg/dL (ref 0.61–1.24)
GFR, Estimated: >60 mL/min (ref >60)
Glucose, Bld: 124 mg/dL — ABNORMAL HIGH (ref 70–99)
Potassium: 4 mmol/L (ref 3.5–5.1)
Sodium: 140 mmol/L (ref 135–145)
Total Bilirubin: 0.6 mg/dL (ref 0.0–1.2)
Total Protein: 7.5 g/dL (ref 6.5–8.1)

## 2023-05-31 LAB — FOLATE: Folate: 12.1 ng/mL (ref >5.9)

## 2023-05-31 LAB — VITAMIN B12: Vitamin B-12: 215 pg/mL (ref 180–914)

## 2023-05-31 LAB — FERRITIN: Ferritin: 45 ng/mL (ref 24–336)

## 2023-05-31 NOTE — Progress Notes (Signed)
 [facility] Cancer Follow up Visit:  Patient Care Team: Lonna Leonor HERO, NP as PCP - General (Nurse Practitioner) Monetta Redell PARAS, MD as PCP - Cardiology (Cardiology) Joya Stabs, DPM as Consulting Physician (Podiatry)  CHIEF COMPLAINTS/PURPOSE OF CONSULTATION:  HISTORY OF PRESENTING ILLNESS: Kenneth Bailey 58 y.o. male is here because of anemia  Medical history is notable for history of peptic/duodenal ulcers, type II diabetes, hypertension, hyperlipidemia, congestive heart failure, coronary artery disease-status post CABG. Status post left fifth toe amputation.   January 2023:CTA chest-slightly enlarged lymph nodes in the mediastinum and bilateral hilar areas largest measuring 1.5.  Patchy groundglass densities in both perihilar regions and lower lung fields with small to moderate bilateral pleural effusions.  2.2 x 0.8 hypervascular structure in the anterior aspect of the body of the pancreas.  This may suggest vascular malformation or neoplastic process.  Mild gallbladder wall thickening with questionable cholecystic stranding (physiologic or suggest acute or chronic inflammation).  No solid enhancing pancreas lesion identified. Corresponding to the recent CT abnormality is a focal area relative hypoenhancement measuring 2.2 x 1.0 cm, image 41/17. Does not have the typical appearance of the pancreatic neoplasm in may represent a thrombosed vessel   June 10-28 2024: Admitted to [facility] with sepsis due to osteomyelitis, acute kidney injury, hematemesis.  Creatinine 2.9.  Hydrated.  S/P debridement left foot ulcer, left 5th toe amputation.  June 15-HbG 7.  MCV 85.  Status post 2 units PRBC June 16-EGD-nonbleeding reflux esophagitis, nonbleeding gastric and duodenal ulcers.  Pathology-reactive gastropathy negative for H. pylori, intestinal metaplasia, dysplasia and carcinoma. June 17-HgB 7.1.  Status post 2 units PRBC     July 4 -8 2024: Admitted to [facility] melena.   Hemoglobin 7.8.  Status post 1 unit PRBC             July 5-EGD-diffuse gastritis, non-bleeding duodenal ulcer with a nonbleeding visible vessel found in the bulb. Injected with epinephrine . Treated with bipolar cautery. Clips placed   January 11, 2023: [facility], ED with bilateral lower extremity peripheral edema.  Placed on furosemide  20 mg daily and potassium chloride  20 meq daily.  Follow-up with cardiology recommended.             August 23-ferritin 69   January 17, 2023: [facility] ED with orthostatic hypotension and hyperkalemia.  Hydrated.  Follow-up with cardiology recommended    September 1- 3 2024: Admitted to [facility] with acute kidney injury, hyperkalemia.  Creatinine 1.9.  Calcium  5.8.  Hydrated             September 3-HgB 9.6.  MCV 81.  Creatinine 1.2.  Potassium 4.1   January 23, 2023: Hematology consult Discharged from [facility] today.  Reports fatigue and pica to ice.  Decreased appetite without weight loss.  Denies melena or hematochezia.Takes clopidogrel  75 mg and aspirin  81 mg daily.    WBC 9.6 hemoglobin 10.3 MCV 86 platelets 372; 77 segs 13 lymphs 5 monos 3 eos 1 Baso 1 immature granulocytes Peripheral smear-normal WBC morphology, RBC morphology-teardrops and polychromasia.    CMP notable for creatinine 1.54 calcium  10.4, glucose 162.  Ferritin 18.  Folate 8.7.  B12 274.   Social history: He currently smokes a half a pack a day. Smoked cigarettes since age 66 at times, up to 3 packs a day for at least 5 years.  He is divorced with no children and lives alone.  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   Family history: He denies any family history of anemia, other hematologic abnormality, or malignancy.  Coombs test negative haptoglobin 326 SPEP with IEP but no paraprotein serum kappa 47.8 lambda 21.7 ratio 2.2 IgG 903 IgA 172 IgM 63  January 30, 2023: Feraheme  510 mg CMP notable for potassium 5.8 glucose 246 creatinine  1.65  February 05 2023: Feraheme  510 mg  February 06, 2023:  States he is not taking diclofenac.  He is taking MVI with iron .  Takes ASA and Plavix .  He is down to 1 or 2 cigarettes daily.  Does not drink EtOH.   No hematochezia, melena.  No pica to ice currently but did have it a few weeks ago.  Energy level OK.      Follow up in 1 month to assess Hgb and iron  stores.    February 20 2023:  Excisional debridement of left lateral forefoot ulcer  March 07 2023:  He is back up to smoking 1 ppd of cigarettes daily.  Discussed importance of smoking cessation Ferritin 81 Hgb 13.9  May 31 2023:  Scheduled follow-up for anemia.  Since last visit underwent partial amputation of left foot.   Remains on Plavix .  Checks FSBG's in the AM-- it was 91 this morning.  Down to 1/2 ppd of cigarettes.  Takes oral iron  occasionally Hgb 15.7   Ferritin, folate, B12 pending    Review of Systems - Oncology  MEDICAL HISTORY: Past Medical History:  Diagnosis Date   Acute combined systolic and diastolic heart failure (HCC)    Acute systolic heart failure (HCC) 06/06/2021   Anemia    Atherosclerotic heart disease 09/07/2021   CHF (congestive heart failure) (HCC)    Chronic systolic CHF (congestive heart failure) (HCC) 11/23/2022   COPD (chronic obstructive pulmonary disease) (HCC)    Diabetes mellitus, type 2 (HCC)    Gastric ulcer    HLD (hyperlipidemia)    Hypertension    Hypertension associated with diabetes (HCC) 09/29/2022   Hypertensive heart disease with congestive heart failure (HCC) 03/28/2023   Kidney disease, chronic, stage III (GFR 30-59 ml/min) (HCC)    Multiple duodenal ulcers    Orthostatic hypotension    Osteomyelitis (HCC)    Peripheral arterial disease (HCC)    Postural hypotension 12/20/2016   PUD (peptic ulcer disease)    Tobacco abuse     SURGICAL HISTORY: Past Surgical History:  Procedure Laterality Date   ABDOMINAL AORTOGRAM W/LOWER EXTREMITY N/A 11/03/2022    Procedure: ABDOMINAL AORTOGRAM W/LOWER EXTREMITY;  Surgeon: Gretta Lonni PARAS, MD;  [facility]: [facility] INV[facility]IVE CV LAB;  Service: Cardiovascular;  Laterality: N/A;   AMPUTATION Left 10/31/2022   Procedure: AMPUTATION LEFT FIFTH TOE;  Surgeon: Joya Stabs, DPM;  Location: [facility] OR;  Service: Podiatry;  Laterality: Left;   AMPUTATION Left 11/15/2022   Procedure: AMPUTATION FITH RAY;  Surgeon: Janit Thresa HERO, DPM;  Location: [facility] OR;  Service: Podiatry;  Laterality: Left;   AMPUTATION TOE Left 10/31/2022   Procedure: IRRIGATION AND DEBRIDEMENTOF LEFT FOOT;  Surgeon: Joya Stabs, DPM;  Location: [facility] OR;  Service: Podiatry;  Laterality: Left;   BIOPSY  11/05/2022   Procedure: BIOPSY;  Surgeon: Rollin Dover, MD;  Location: [facility] ENDOSCOPY;  Service: Gastroenterology;;   CORONARY ARTERY BYPASS GRAFT N/A 06/10/2021   Procedure: CORONARY ARTERY BYPASS GRAFTING (CABG) TIMES FOUR, USING LEFT INTERNAL MAMMARY ARTERY AND RIGHT GREATER SAPHENOUS VEIN HARVESTED ENDOSCOPICALLY;  Surgeon: Obadiah Coy, MD;  Location: [facility] OR;  Service: Open Heart Surgery;  Laterality: N/A;   ENDOVEIN HARVEST OF GREATER SAPHENOUS VEIN Right 06/10/2021   Procedure: ENDOVEIN HARVEST OF GREATER SAPHENOUS VEIN;  Surgeon: Obadiah Coy, MD;  Location: [facility] OR;  Service: Open Heart Surgery;  Laterality: Right;   ESOPHAGOGASTRODUODENOSCOPY N/A 11/24/2022   Procedure: ESOPHAGOGASTRODUODENOSCOPY (EGD);  Surgeon: Wilhelmenia Aloha Raddle., MD;  Location: [facility] ENDOSCOPY;  Service: Gastroenterology;  Laterality: N/A;   ESOPHAGOGASTRODUODENOSCOPY (EGD) WITH PROPOFOL  N/A 11/05/2022   Procedure: ESOPHAGOGASTRODUODENOSCOPY (EGD) WITH PROPOFOL ;  Surgeon: Rollin Dover, MD;  Location: [facility] ENDOSCOPY;  Service: Gastroenterology;  Laterality: N/A;   HEMOSTASIS CLIP PLACEMENT  11/24/2022   Procedure: HEMOSTASIS CLIP PLACEMENT;  Surgeon: Wilhelmenia Aloha Raddle., MD;  Location: [facility] ENDOSCOPY;  Service: Gastroenterology;;   HEMOSTASIS CONTROL  11/24/2022   Procedure:  HEMOSTASIS CONTROL;  Surgeon: Wilhelmenia Aloha Raddle., MD;  Location: [facility] ENDOSCOPY;  Service: Gastroenterology;;   HOT HEMOSTASIS N/A 11/24/2022   Procedure: HOT HEMOSTASIS (ARGON PLASMA COAGULATION/BICAP);  Surgeon: Wilhelmenia Aloha Raddle., MD;  Location: [facility] ENDOSCOPY;  Service: Gastroenterology;  Laterality: N/A;   I & D EXTREMITY Left 11/08/2022   Procedure: IRRIGATION AND DEBRIDEMENT OF FOOT AND WASHOUT;  Surgeon: Gershon Donnice SAUNDERS, DPM;  Location: [facility] OR;  Service: Podiatry;  Laterality: Left;   PERIPHERAL VASCULAR BALLOON ANGIOPLASTY  11/03/2022   Procedure: PERIPHERAL VASCULAR BALLOON ANGIOPLASTY;  Surgeon: Gretta Lonni PARAS, MD;  [facility]: [facility];  Service: Cardiovascular;;   RIGHT/LEFT HEART CATH AND CORONARY ANGIOGRAPHY N/A 06/06/2021   Procedure: RIGHT/LEFT HEART CATH AND CORONARY ANGIOGRAPHY;  Surgeon: Cherrie Toribio SAUNDERS, MD;  [facility]: [facility];  Service: Cardiovascular;  Laterality: N/A;   TEE WITHOUT CARDIOVERSION N/A 06/10/2021   Procedure: TRANSESOPHAGEAL ECHOCARDIOGRAM (TEE);  Surgeon: Obadiah Coy, MD;  Location: [facility] OR;  Service: Open Heart Surgery;  Laterality: N/A;    SOCIAL HISTORY: Social History   Socioeconomic History   Marital status: Divorced    Spouse name: Not on file   Number of children: Not on file   Years of education: Not on file   Highest education level: 7th grade  Occupational History   Occupation: out of work at the present time  Tobacco Use   Smoking status: Every Day    Current packs/day: 1.00    Average packs/day: 1 pack/day for 51.4 years (51.4 ttl pk-yrs)    Types: Cigarettes    Start date: 01/23/1972    Passive exposure: Never   Smokeless tobacco: Never   Tobacco comments:    Trying to quit with Chantix -12/2022  Vaping Use   Vaping status: Never Used  Substance and Sexual Activity   Alcohol use: Yes    Comment: occasionally beer every other weekend   Drug use: Never   Sexual activity: Not on file  Other Topics  Concern   Not on file  Social History Narrative   Not on file   Social Drivers of Health   Financial Resource Strain: Not at Risk (06/14/2022)   Received from [facility]    Financial Resource Strain: 1  Food Insecurity: No Food Insecurity (01/23/2023)   Hunger Vital Sign    Worried About Running Out of Food in the Last Year: Never true    Ran Out of Food in the Last Year: Never true  Transportation Needs: No Transportation Needs (01/23/2023)   PRAPARE - Administrator, Civil Service (Medical): No    Lack of Transportation (Non-Medical): No  Physical Activity: Not at Risk (06/14/2022)   [facility]   Physical Activity    Physical Activity: 1  Stress: Not at Risk (06/14/2022)   [facility]   Stress    Stress: 1  Social Connections: Not at Risk (06/14/2022)   Received from [facility]   Social Connections    Connectedness: 1  Intimate Partner Violence: Not At Risk (01/23/2023)   Humiliation, Afraid, Rape, and Kick questionnaire    Fear of Current or Ex-Partner: No    Emotionally Abused: No    Physically Abused: No    Sexually Abused: No    FAMILY HISTORY Family History  Problem Relation Age of Onset   Hypertension Mother    Cancer Mother    Heart disease Father     ALLERGIES:  is allergic to lisinopril and statins.  MEDICATIONS:  Current Outpatient Medications  Medication Sig Dispense Refill   clopidogrel  (PLAVIX ) 75 MG tablet Take 1 tablet (75 mg total) by mouth daily. 90 tablet 3   gabapentin  (NEURONTIN ) 600 MG tablet Take 600 mg by mouth daily as needed (nerve pain). Patient reports taking twice daily     insulin  aspart (NOVOLOG ) 100 UNIT/ML injection Inject 0-9 Units into the skin 3 (three) times daily with meals. CBG < 70: Implement Hypoglycemia Standing Orders CBG 70 - 120: 0 units CBG 121 - 150: 1 unit CBG 151 - 200: 2 units CBG 201 - 250: 3 units CBG 251 - 300: 5 units CBG 301 - 350: 7 units CBG 351 -  400: 9 units CBG > 400: call MD and obtain STAT lab verification 10 mL 0   Insulin  Syringe-Needle U-100 30G X 1/2 0.3 ML MISC Use with Novolog  90 each 0   metFORMIN (GLUCOPHAGE) 1000 MG tablet Take 1,000 mg by mouth 2 (two) times daily.     mupirocin  ointment (BACTROBAN ) 2 % Apply 1 Application topically daily. 22 g 0   polyethylene glycol (MIRALAX  / GLYCOLAX ) 17 g packet Take 17 g by mouth daily as needed for mild constipation. 14 each 0   SEMGLEE , YFGN, 100 UNIT/ML Pen Inject 100 Units into the skin once a week.     senna-docusate (SENOKOT-S) 8.6-50 MG tablet Take 1 tablet by mouth 2 (two) times daily. 30 tablet 0   ferrous sulfate  325 (65 FE) MG tablet Take 325 mg by mouth daily. (Patient not taking: Reported on 05/31/2023)     furosemide  (LASIX ) 20 MG tablet Take 1 tablet (20 mg total) by mouth 2 (two) times daily. (Patient not taking: Reported on 05/31/2023) 180 tablet 0   Study - DAPA TIMI 68 - dapagliflozin  (FARXIGA ) 10 mg or placebo tablet (PI-Dalton McLean) Take 1 tablet by mouth in the morning and at bedtime. (Patient not taking: Reported on 05/31/2023)     No current facility-administered medications for this visit.    PHYSICAL EXAMINATION:  ECOG PERFORMANCE STATUS: 1 - Symptomatic but completely ambulatory   Vitals:   05/31/23 1021  BP: (!) 148/94  Pulse: 90  Resp: 18  Temp: 98.3 F (36.8 C)  SpO2: 97%     Filed Weights   05/31/23 1021  Weight: 174 lb 11.2 oz (79.2 kg)      Physical Exam Vitals and nursing note reviewed.  Constitutional:      General: He is not in acute distress.    Appearance: Normal appearance. He is normal weight. He is not diaphoretic.  HENT:     Head: Normocephalic and atraumatic.     Right Ear: External ear normal.  Left Ear: External ear normal.     Nose: Nose normal.  Eyes:     General: No scleral icterus.    Conjunctiva/sclera: Conjunctivae normal.     Pupils: Pupils are equal, round, and reactive to light.  Cardiovascular:      Rate and Rhythm: Normal rate and regular rhythm.     Heart sounds: Normal heart sounds. No murmur heard.    No friction rub. No gallop.  Pulmonary:     Effort: Pulmonary effort is normal. No respiratory distress.     Breath sounds: Normal breath sounds. No wheezing.  Abdominal:     General: Bowel sounds are normal.     Palpations: Abdomen is soft.     Tenderness: There is no abdominal tenderness. There is no guarding or rebound.  Musculoskeletal:        General: No swelling.     Cervical back: Normal range of motion and neck supple. No rigidity or tenderness.     Right lower leg: No edema.     Left lower leg: No edema.  Lymphadenopathy:     Head:     Right side of head: No submental, submandibular, tonsillar, preauricular, posterior auricular or occipital adenopathy.     Left side of head: No submental, submandibular, tonsillar, preauricular, posterior auricular or occipital adenopathy.     Cervical: No cervical adenopathy.     Right cervical: No superficial, deep or posterior cervical adenopathy.    Left cervical: No superficial, deep or posterior cervical adenopathy.     Upper Body:     Right upper body: No supraclavicular or axillary adenopathy.     Left upper body: No supraclavicular or axillary adenopathy.     Lower Body: No right inguinal adenopathy. No left inguinal adenopathy.  Skin:    Coloration: Skin is not jaundiced or pale.     Findings: No erythema.  Neurological:     General: No focal deficit present.     Mental Status: He is alert and oriented to person, place, and time.     Cranial Nerves: No cranial nerve deficit.  Psychiatric:        Mood and Affect: Mood normal.        Behavior: Behavior normal.        Thought Content: Thought content normal.        Judgment: Judgment normal.     LABORATORY DATA: I have personally reviewed the data as listed:  Hospital Outpatient Visit on 05/29/2023  Component Date Value Ref Range Status   Right ABI 05/29/2023 0.59    Final   Left ABI 05/29/2023 0.69   Final  Appointment on 05/08/2023  Component Date Value Ref Range Status   S' Lateral 05/08/2023 4.10  cm Final   Area-P 1/2 05/08/2023 5.75  cm2 Final   AV Area mean vel 05/08/2023 1.61  cm2 Final   AV Area VTI 05/08/2023 1.66  cm2 Final   AR max vel 05/08/2023 1.60  cm2 Final   AV Mean grad 05/08/2023 5.5  mmHg Final   Ao pk vel 05/08/2023 1.60  m/s Final   AV Peak grad 05/08/2023 10.2  mmHg Final   Est EF 05/08/2023 50 - 55%   Final    RADIOGRAPHIC STUDIES: I have personally reviewed the radiological images as listed and agree with the findings in the report  VAS US  ABI WITH/WO TBI Result Date: 05/29/2023  LOWER EXTREMITY DOPPLER STUDY Patient Name:  Kenneth Bailey  Date of Exam:  05/29/2023 Medical Rec #: [id number]            Accession #:    [id number] Date of Birth: [birth year]             Patient Gender: M Patient Age:   44 years Exam Location:  [facility] Procedure:      VAS US  ABI WITH/WO TBI Referring Phys: --------------------------------------------------------------------------------  Indications: Peripheral artery disease. High Risk Factors: Hypertension, hyperlipidemia, Diabetes, current smoker. Other Factors: Left 5th ray amputation 11/15/2022.  Vascular Interventions: Left PTA and TPT angioplasty 11/13/2022. Performing Technologist: Geni Lodge RVS, RCS  Examination Guidelines: A complete evaluation includes at minimum, Doppler waveform signals and systolic blood pressure reading at the level of bilateral brachial, anterior tibial, and posterior tibial arteries, when vessel segments are accessible. Bilateral testing is considered an integral part of a complete examination. Photoelectric Plethysmograph (PPG) waveforms and toe systolic pressure readings are included as required and additional duplex testing as needed. Limited examinations for reoccurring indications may be performed as noted.  ABI Findings:  +---------+------------------+-----+----------+--------+ Right    Rt Pressure (mmHg)IndexWaveform  Comment  +---------+------------------+-----+----------+--------+ Brachial 150                                       +---------+------------------+-----+----------+--------+ PTA      82                0.55 monophasic         +---------+------------------+-----+----------+--------+ DP       89                0.59 monophasic         +---------+------------------+-----+----------+--------+ Great Toe55                0.37                    +---------+------------------+-----+----------+--------+ +---------+------------------+-----+----------+-------+ Left     Lt Pressure (mmHg)IndexWaveform  Comment +---------+------------------+-----+----------+-------+ Brachial 150                                      +---------+------------------+-----+----------+-------+ PTA      104               0.69 biphasic          +---------+------------------+-----+----------+-------+ DP       88                0.59 monophasic        +---------+------------------+-----+----------+-------+ Great Toe55                0.37                   +---------+------------------+-----+----------+-------+ +-------+-----------+-----------+------------+------------+ ABI/TBIToday's ABIToday's TBIPrevious ABIPrevious TBI +-------+-----------+-----------+------------+------------+ Right  0.59       0.37       0.68        0.29         +-------+-----------+-----------+------------+------------+ Left   0.69       0.37       1.09        0.25         +-------+-----------+-----------+------------+------------+  Right ABIs appear decreased compared to prior study on 12/11/2022. Left ABI appears decreased from the last exam.  Summary: Right: Resting right ankle-brachial index indicates moderate right lower  extremity arterial disease. The right toe-brachial index is abnormal. Left: Resting left  ankle-brachial index indicates moderate left lower extremity arterial disease. The left toe-brachial index is abnormal. *See table(s) above for measurements and observations.  Electronically signed by Debby Robertson on 05/29/2023 at 4:26:30 PM.    Final    VAS US  LOWER EXTREMITY ARTERIAL DUPLEX Result Date: 05/29/2023 LOWER EXTREMITY ARTERIAL DUPLEX STUDY Patient Name:  AMAZIAH RAISANEN [facility]  Date of Exam:   05/29/2023 Medical Rec #: [id number]            Accession #:    [id number] Date of Birth: 03-31-1966             Patient Gender: M Patient Age:   33 years Exam Location:  [facility] Procedure:      VAS US  LOWER EXTREMITY ARTERIAL DUPLEX Referring Phys: LONNI GASKINS --------------------------------------------------------------------------------  Indications: Peripheral artery disease. High Risk Factors: Hypertension, hyperlipidemia. Other Factors: 11/15/22 Left fifth ray amputation                11/08/22 Left foot irrigation and debridement                10/31/22 Left fifth toe amputation, left foot irrigation and                debridement                 CHF and Hx CABG.  Vascular Interventions: 11/03/22 Left posterior tibial artery and tibioperoneal                         trunk angioplasty. Current ABI:            R=0.59, L=0.69 Performing Technologist: Geni Lodge RVS, RCS  Examination Guidelines: A complete evaluation includes B-mode imaging, spectral Doppler, color Doppler, and power Doppler as needed of all accessible portions of each vessel. Bilateral testing is considered an integral part of a complete examination. Limited examinations for reoccurring indications may be performed as noted.   +----------+--------+-----+--------+--------+--------+ LEFT      PSV cm/sRatioStenosisWaveformComments +----------+--------+-----+--------+--------+--------+ CFA Distal84                   biphasic         +----------+--------+-----+--------+--------+--------+ SFA Prox  65                    biphasic         +----------+--------+-----+--------+--------+--------+ SFA Mid   69                   biphasic         +----------+--------+-----+--------+--------+--------+ SFA Distal36                   biphasic         +----------+--------+-----+--------+--------+--------+ POP Prox  33                   biphasic         +----------+--------+-----+--------+--------+--------+ POP Distal23                   biphasic         +----------+--------+-----+--------+--------+--------+ ATA Distal32                   biphasic         +----------+--------+-----+--------+--------+--------+ PTA Distal30  biphasic         +----------+--------+-----+--------+--------+--------+  Summary: Left: Patent without evidence of hemodynamically significant stenosis.  See table(s) above for measurements and observations. Electronically signed by Debby Robertson on 05/29/2023 at 4:26:22 PM.    Final    DG Foot Complete Left Result Date: 05/28/2023 Please see detailed radiograph report in office note.   ASSESSMENT/PLAN 58 y.o. male is here because of anemia.  Medical history is notable for history of peptic/duodenal ulcers, type II diabetes, hypertension, hyperlipidemia, congestive heart failure, coronary artery disease-status post CABG. Status post left fifth toe amputation.  Anemia:  Etiology multifactorial with culprits being 1) Iron  deficiency from GI blood loss exacerbated by antiplatelet therapy 2) CKD 3) Chronic inflammation   February 04 2024- Feraheme  510 mg   February 05 2023- Feraheme  510 mg March 07 2023- Hgb 13.9 Ferritin 81.  Continue MVI with iron  May 31 2023- Hgb 15.7.  Ferritin, folate, B12 pending.  Continue MVI with iron    Chronic kidney disease  February 06 2023- Likely related to DM Type II, CHF, PVD and HTN.    SPEP with IEP and free light chains from earlier this month negative for paraprotein    May 31 2023- Cr 1.1    Tobacco use  February 06 2023- Discussed strategies for smoking cessation  March 07 2023- Continued encouragement for smoking cessation particularly given severity of PVD  May 31 2023- Has decreased use to 1/2 ppd of cigarettes.  Referring for CT lung screening   Cancer Staging  No matching staging information was found for the patient.    No problem-specific Assessment & Plan notes found for this encounter.    No orders of the defined types were placed in this encounter.   30  minutes was spent in patient care.  This included time spent preparing to see the patient (e.g., review of tests), obtaining and/or reviewing separately obtained history, counseling and educating the patient/family/caregiver, ordering medications, tests, or procedures; documenting clinical information in the electronic or other health record, independently interpreting results and communicating results to the patient/family/caregiver as well as coordination of care.       All questions were answered. The patient knows to call the clinic with any problems, questions or concerns.  This note was electronically signed.    Guillermina JAYSON Perla, MD  05/31/2023 10:31 AM

## 2023-06-08 ENCOUNTER — Other Ambulatory Visit: Payer: Self-pay

## 2023-06-08 DIAGNOSIS — I739 Peripheral vascular disease, unspecified: Secondary | ICD-10-CM

## 2023-06-12 ENCOUNTER — Ambulatory Visit (HOSPITAL_BASED_OUTPATIENT_CLINIC_OR_DEPARTMENT_OTHER)
Admission: RE | Admit: 2023-06-12 | Discharge: 2023-06-12 | Disposition: A | Discharge status: Home / Self Care | Payer: Medicaid Other | Source: Ambulatory Visit | Attending: Oncology | Admitting: Oncology

## 2023-06-12 DIAGNOSIS — F17210 Nicotine dependence, cigarettes, uncomplicated: Secondary | ICD-10-CM | POA: Diagnosis not present

## 2023-06-12 DIAGNOSIS — Z720 Tobacco use: Secondary | ICD-10-CM

## 2023-06-17 ENCOUNTER — Other Ambulatory Visit: Payer: Self-pay

## 2023-06-17 ENCOUNTER — Encounter (HOSPITAL_COMMUNITY): Payer: Self-pay | Admitting: *Deleted

## 2023-06-17 ENCOUNTER — Emergency Department (HOSPITAL_COMMUNITY)
Admission: EM | Admit: 2023-06-17 | Discharge: 2023-06-18 | Disposition: A | Discharge status: Home / Self Care | Payer: Medicaid Other

## 2023-06-17 ENCOUNTER — Emergency Department (HOSPITAL_COMMUNITY): Payer: Medicaid Other

## 2023-06-17 DIAGNOSIS — M79672 Pain in left foot: Secondary | ICD-10-CM | POA: Diagnosis present

## 2023-06-17 DIAGNOSIS — Z794 Long term (current) use of insulin: Secondary | ICD-10-CM | POA: Insufficient documentation

## 2023-06-17 DIAGNOSIS — Z79899 Other long term (current) drug therapy: Secondary | ICD-10-CM | POA: Diagnosis not present

## 2023-06-17 DIAGNOSIS — Z7902 Long term (current) use of antithrombotics/antiplatelets: Secondary | ICD-10-CM | POA: Diagnosis not present

## 2023-06-17 DIAGNOSIS — Z7984 Long term (current) use of oral hypoglycemic drugs: Secondary | ICD-10-CM | POA: Insufficient documentation

## 2023-06-17 DIAGNOSIS — R739 Hyperglycemia, unspecified: Secondary | ICD-10-CM | POA: Diagnosis not present

## 2023-06-17 DIAGNOSIS — L03116 Cellulitis of left lower limb: Secondary | ICD-10-CM

## 2023-06-17 LAB — COMPREHENSIVE METABOLIC PANEL
ALT: 18 U/L (ref 0–44)
AST: 17 U/L (ref 15–41)
Albumin: 3.5 g/dL (ref 3.5–5.0)
Alkaline Phosphatase: 132 U/L — ABNORMAL HIGH (ref 38–126)
Anion gap: 11 (ref 5–15)
BUN: 19 mg/dL (ref 6–20)
CO2: 26 mmol/L (ref 22–32)
Calcium: 9.2 mg/dL (ref 8.9–10.3)
Chloride: 101 mmol/L (ref 98–111)
Creatinine, Ser: 0.99 mg/dL (ref 0.61–1.24)
GFR, Estimated: >60 mL/min (ref >60)
Glucose, Bld: 315 mg/dL — ABNORMAL HIGH (ref 70–99)
Potassium: 4.4 mmol/L (ref 3.5–5.1)
Sodium: 138 mmol/L (ref 135–145)
Total Bilirubin: 0.5 mg/dL (ref 0.0–1.2)
Total Protein: 7 g/dL (ref 6.5–8.1)

## 2023-06-17 LAB — URINALYSIS, ROUTINE W REFLEX MICROSCOPIC
Bilirubin Urine: NEGATIVE
Glucose, UA: >=500 mg/dL — AB
Hgb urine dipstick: NEGATIVE
Ketones, ur: NEGATIVE mg/dL
Leukocytes,Ua: NEGATIVE
Nitrite: NEGATIVE
Protein, ur: 100 mg/dL — AB
Specific Gravity, Urine: 1.031 — ABNORMAL HIGH (ref 1.005–1.030)
pH: 6 (ref 5.0–8.0)

## 2023-06-17 LAB — CBC WITH DIFFERENTIAL/PLATELET
Abs Immature Granulocytes: 0.04 10*3/uL (ref 0.00–0.07)
Basophils Absolute: 0.1 10*3/uL (ref 0.0–0.1)
Basophils Relative: 1 %
Eosinophils Absolute: 0.2 10*3/uL (ref 0.0–0.5)
Eosinophils Relative: 2 %
HCT: 42.7 % (ref 39.0–52.0)
Hemoglobin: 14 g/dL (ref 13.0–17.0)
Immature Granulocytes: 1 %
Lymphocytes Relative: 23 %
Lymphs Abs: 2 10*3/uL (ref 0.7–4.0)
MCH: 27.6 pg (ref 26.0–34.0)
MCHC: 32.8 g/dL (ref 30.0–36.0)
MCV: 84.2 fL (ref 80.0–100.0)
Monocytes Absolute: 0.5 10*3/uL (ref 0.1–1.0)
Monocytes Relative: 6 %
Neutro Abs: 5.9 10*3/uL (ref 1.7–7.7)
Neutrophils Relative %: 67 %
Platelets: 301 10*3/uL (ref 150–400)
RBC: 5.07 MIL/uL (ref 4.22–5.81)
RDW: 13.6 % (ref 11.5–15.5)
WBC: 8.7 10*3/uL (ref 4.0–10.5)
nRBC: 0 % (ref 0.0–0.2)

## 2023-06-17 LAB — SEDIMENTATION RATE: Sed Rate: 40 mm/h — ABNORMAL HIGH (ref 0–16)

## 2023-06-17 NOTE — ED Provider Triage Note (Signed)
Emergency Medicine Provider Triage Evaluation Note  Kenneth Bailey , a 58 y.o. male  was evaluated in triage.  Pt complains of L foot wound, first noticed tonight. Hx of DM w/ neuropathy, CHF. 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 Started experiencing L foot pain yesterday. Compliant w/ meds.  Recently treated for pneumonia w/ abx.   Has been able to ambulate.   Endorses chronic numbness Denies fevers, chest pain, shortness of breath, abdominal pain, n/v/d, dysuria.   Review of Systems  Positive: See above Negative: See above  Physical Exam  BP 130/83   Pulse 96   Temp 98.4 F (36.9 C)   Resp 16   Ht 5\' 8"  (1.727 m)   Wt 79.2 kg   SpO2 99%   BMI 26.55 kg/m  Gen:   Awake, no distress   Resp:  Normal effort  MSK:   Moves extremities without difficulty  Other:    Medical Decision Making  Medically screening exam initiated at 8:17 PM.  Appropriate orders placed.  Kenneth Bailey was informed that the remainder of the evaluation will be completed by another provider, this initial triage assessment does not replace that evaluation, and the importance of remaining in the ED until their evaluation is complete.     Lunette Stands, [place] 06/17/23 2023

## 2023-06-17 NOTE — ED Triage Notes (Signed)
The pt is a diabetic and  he had surgery back in June  today the lt foot has been draining  on the lt foot

## 2023-06-18 MED ORDER — OXYCODONE-ACETAMINOPHEN 5-325 MG PO TABS
1.0000 | ORAL_TABLET | Freq: Once | ORAL | Status: AC
  Administered 2023-06-18: 1 via ORAL
  Filled 2023-06-18: qty 1

## 2023-06-18 MED ORDER — AMOXICILLIN-POT CLAVULANATE 875-125 MG PO TABS: 1.0000 | ORAL_TABLET | Freq: Two times a day (BID) | ORAL | 0 refills | Status: AC

## 2023-06-18 NOTE — Discharge Instructions (Addendum)
We are prescribing you antibiotics.  Please take them as prescribed for the full course.  Please follow-up with your podiatrist.  They should be calling you to schedule an appointment, if you do not hear from them by lunchtime please call them this afternoon to ensure that you are able to be seen promptly.  Return immediately felt fevers, chills, severe pain, redness spreads up the leg, your foot becomes pale, blue or cold or you develop any new or worsening symptoms that are concerning to you.

## 2023-06-18 NOTE — ED Provider Notes (Signed)
[place] EMERGENCY DEPARTMENT AT [facility] Provider Note   CSN: [id number] Arrival date & time: 06/17/23  1857     History  Chief Complaint  Patient presents with  . Foot Pain    Kenneth Bailey is a 58 y.o. male.  This is a 58 year old male presenting emergency department for left foot pain and discharge.  He had partial left foot amputation of his fifth metatarsal in June 2024.  Noticed that he started having pain yesterday afternoon and with some leaking of fluid from fifth metatarsal surgical site.  No fevers or chills.  Notes pain to the the area   Foot Pain       Home Medications Prior to Admission medications   Medication Sig Start Date End Date Taking? Authorizing Provider  clopidogrel (PLAVIX) 75 MG tablet Take 1 tablet (75 mg total) by mouth daily. 05/14/23   Flossie Dibble, NP  ferrous sulfate 325 (65 FE) MG tablet Take 325 mg by mouth daily. Patient not taking: Reported on 05/31/2023 01/12/23   [provider]  furosemide (LASIX) 20 MG tablet Take 1 tablet (20 mg total) by mouth 2 (two) times daily. Patient not taking: Reported on 05/31/2023 01/12/23   Baldo Daub, MD  gabapentin (NEURONTIN) 600 MG tablet Take 600 mg by mouth daily as needed (nerve pain). Patient reports taking twice daily    [provider]  insulin aspart (NOVOLOG) 100 UNIT/ML injection Inject 0-9 Units into the skin 3 (three) times daily with meals. CBG < 70: Implement Hypoglycemia Standing Orders CBG 70 - 120: 0 units CBG 121 - 150: 1 unit CBG 151 - 200: 2 units CBG 201 - 250: 3 units CBG 251 - 300: 5 units CBG 301 - 350: 7 units CBG 351 - 400: 9 units CBG > 400: call MD and obtain STAT lab verification 11/17/22   Marguerita Merles Latif, DO  Insulin Syringe-Needle U-100 30G X 1/2" 0.3 ML MISC Use with Novolog 11/17/22   Marguerita Merles Latif, DO  metFORMIN (GLUCOPHAGE) 1000 MG tablet Take 1,000 mg by mouth 2 (two) times daily. 05/26/21   [provider]   mupirocin ointment (BACTROBAN) 2 % Apply 1 Application topically daily. 01/23/23   Standiford, Jenelle Mages, DPM  polyethylene glycol (MIRALAX / GLYCOLAX) 17 g packet Take 17 g by mouth daily as needed for mild constipation. 11/07/22   Sheikh, Omair Latif, DO  SEMGLEE, YFGN, 100 UNIT/ML Pen Inject 100 Units into the skin once a week. 01/12/23   [provider]  senna-docusate (SENOKOT-S) 8.6-50 MG tablet Take 1 tablet by mouth 2 (two) times daily. 11/27/22   Azucena Fallen, MD  Study - DAPA TIMI 68 - dapagliflozin (FARXIGA) 10 mg or placebo tablet (PI-Dalton Shirlee Latch) Take 1 tablet by mouth in the morning and at bedtime. Patient not taking: Reported on 05/31/2023 06/20/21   [provider]      Allergies    Lisinopril and Statins    Review of Systems   Review of Systems  Physical Exam Updated Vital Signs BP (!) 149/93   Pulse 91   Temp 98 F (36.7 C) (Oral)   Resp 18   Ht 5\' 8"  (1.727 m)   Wt 79.2 kg   SpO2 100%   BMI 26.55 kg/m  Physical Exam Vitals and nursing note reviewed.  Constitutional:      General: He is not in acute distress.    Appearance: He is not toxic-appearing.  HENT:  Head: Normocephalic.     Nose: Nose normal.     Mouth/Throat:     Mouth: Mucous membranes are moist.  Eyes:     Conjunctiva/sclera: Conjunctivae normal.  Cardiovascular:     Rate and Rhythm: Normal rate and regular rhythm.     Pulses: Normal pulses.  Pulmonary:     Effort: Pulmonary effort is normal.  Musculoskeletal:     Right lower leg: No edema.     Left lower leg: No edema.     Comments: See photo below.  Left foot with scab over fifth metatarsal base.  Unable to express any purulent drainage.  Some minor erythema around base.  Some tenderness to palpation.  Has 2+ DP pulses bilaterally.  Soft compartments.  Normal sensation and is neurovascularly intact  Skin:    General: Skin is warm.     Capillary Refill: Capillary refill takes less than 2 seconds.  Neurological:      Mental Status: He is alert and oriented to person, place, and time.  Psychiatric:        Mood and Affect: Mood normal.        Behavior: Behavior normal.     ED Results / Procedures / Treatments   Labs (all labs ordered are listed, but only abnormal results are displayed) Labs Reviewed  COMPREHENSIVE METABOLIC PANEL - Abnormal; Notable for the following components:      Result Value   Glucose, Bld 315 (*)    Alkaline Phosphatase 132 (*)    All other components within normal limits  URINALYSIS, ROUTINE W REFLEX MICROSCOPIC - Abnormal; Notable for the following components:   Specific Gravity, Urine 1.031 (*)    Glucose, UA >=500 (*)    Protein, ur 100 (*)    Bacteria, UA RARE (*)    All other components within normal limits  SEDIMENTATION RATE - Abnormal; Notable for the following components:   Sed Rate 40 (*)    All other components within normal limits  CBC WITH DIFFERENTIAL/PLATELET    EKG None  Radiology DG Foot Complete Left Result Date: 06/17/2023 CLINICAL DATA:  Left foot pain with wound, initial encounter EXAM: LEFT FOOT - COMPLETE 3+ VIEW COMPARISON:  05/28/2023 FINDINGS: Changes of prior amputation of the fifth ray as well as portions of fourth metatarsal. Small soft tissue wound is noted adjacent to the fourth proximal phalanx. No erosive changes to suggest osteomyelitis are seen. IMPRESSION: Postsurgical changes without evidence of osteomyelitis. Electronically Signed   By: Alcide Clever M.D.   On: 06/17/2023 21:48    Procedures Procedures    Medications Ordered in ED Medications - No data to display  ED Course/ Medical Decision Making/ A&P Clinical Course as of 06/18/23 1034  Mon Jun 18, 2023  1019 Discussed with on-call podiatry, Dr. Jamse Arn, who reviewed pictures, labs x-rays.  Does not think patient needs further workup or admission.  Recommending Augmentin or doxycycline and close follow-up.  He will speak with his scheduler to reach out and hopefully  have patient follow-up tomorrow. [TY]    Clinical Course User Index [TY] Coral Spikes, DO                                 Medical Decision Making This is a well-appearing 58 year old male presenting to the emergency department for foot pain in the setting of prior osteomyelitis/amputation in June 2024.  He is afebrile nontachycardic hemodynamically stable.  No  signs of systemic infection as she has no fever tachycardia no leukocytosis.  Comprehensive metabolic panel with some hyperglycemia, does not appear to be in DKA.  Sed rate elevated at 40, but appears to have chronic elevation as it was previously 31 per chart review.  X-ray without obvious acute osteomyelitis. Case discussed with podiatry; see ED course.  Will discharge with antibiotics and close podiatry follow-up.  Amount and/or Complexity of Data Reviewed Independent Historian:     Details: Friend notes ambulates with cane External Data Reviewed:     Details: See above Labs:     Details: See above Radiology:     Details: See above Discussion of management or test interpretation with external provider(s): Podiatry  Risk Decision regarding hospitalization. Diagnosis or treatment significantly limited by social determinants of health. Risk Details: Poor health literacy          Final Clinical Impression(s) / ED Diagnoses Final diagnoses:  None    Rx / DC Orders ED Discharge Orders     None         Coral Spikes, DO 06/18/23 1033

## 2023-06-19 ENCOUNTER — Encounter: Payer: Self-pay | Admitting: Podiatry

## 2023-06-19 ENCOUNTER — Ambulatory Visit (INDEPENDENT_AMBULATORY_CARE_PROVIDER_SITE_OTHER): Payer: Medicaid Other

## 2023-06-19 ENCOUNTER — Ambulatory Visit (INDEPENDENT_AMBULATORY_CARE_PROVIDER_SITE_OTHER): Payer: Medicaid Other | Admitting: Podiatry

## 2023-06-19 DIAGNOSIS — B351 Tinea unguium: Secondary | ICD-10-CM

## 2023-06-19 DIAGNOSIS — M79674 Pain in right toe(s): Secondary | ICD-10-CM

## 2023-06-19 DIAGNOSIS — L03116 Cellulitis of left lower limb: Secondary | ICD-10-CM | POA: Diagnosis not present

## 2023-06-19 DIAGNOSIS — L97522 Non-pressure chronic ulcer of other part of left foot with fat layer exposed: Secondary | ICD-10-CM

## 2023-06-19 DIAGNOSIS — M778 Other enthesopathies, not elsewhere classified: Secondary | ICD-10-CM

## 2023-06-19 DIAGNOSIS — I739 Peripheral vascular disease, unspecified: Secondary | ICD-10-CM

## 2023-06-19 DIAGNOSIS — M79675 Pain in left toe(s): Secondary | ICD-10-CM

## 2023-06-19 DIAGNOSIS — Z89422 Acquired absence of other left toe(s): Secondary | ICD-10-CM

## 2023-06-19 MED ORDER — AMOXICILLIN-POT CLAVULANATE 875-125 MG PO TABS: 1.0000 | ORAL_TABLET | Freq: Two times a day (BID) | ORAL | 0 refills | Status: AC

## 2023-06-19 MED ORDER — DOXYCYCLINE HYCLATE 100 MG PO TABS: 100.0000 mg | ORAL_TABLET | Freq: Two times a day (BID) | ORAL | 0 refills | Status: AC

## 2023-06-19 NOTE — Progress Notes (Unsigned)
Subjective:  Patient ID: Kenneth Bailey, male    DOB: [birth year],  MRN: [id number]  Chief Complaint  Patient presents with   Foot Problem    LT foot. Improved since being discharged. Started abx yesterday and will finish them next Monday. No drainage today. Some pain, 5/10, describes it as throbbing.     58 y.o. male presents today for evaluation of left foot ulceration lateral aspect of fourth toe.  Patient was seen in the ED yesterday he was given antibiotics.  Redness and swelling have improved.  He has history of diabetes, fourth and fifth ray resection, PAD with revascularization.  Last visit he was seen here for plantar fibroma nodule which was injected, he states that this did provide good improvement.  Regarding the ulceration, it is still sore however pain has improved per patient.  No drainage today.  Review of Systems: Negative except as noted in the HPI. Denies N/V/F/Ch.   Objective:  There were no vitals filed for this visit. There is no height or weight on file to calculate BMI. Constitutional Well developed. Well nourished.  Vascular Weightbearing.  Findings: Warmth within normal limits to lower extremity.  Nonpalpable pedal pulses left foot. Capillary refill intact to all digits.  Calf is soft and supple, no posterior calf or knee pain, negative Homans' sign.  Pedal hair growth absent  Neurologic Normal speech. Oriented to person, place, and time. Epicritic sensation to light touch grossly present bilaterally.  Protective sensation decreased  Dermatologic Left foot ulcer lateral aspect of base of fourth toe present measuring approximately 1 cm in diameter, fat layer exposed.  No drainage.  Some erythema present to the forefoot without significant edema. Nodule along medial band of plantar fascia left foot nonpalpable today Nail plates x 9 are notable for increased thickness, increased length, yellow discoloration and subungual debris.  They are painful on direct dorsal  palpation.   Orthopedic: Prior fourth and fifth ray amputation site. No gaps in plantar fascia Previous nodule does appear resolved No pain on palpation of plantar medial calcaneal tubercle or plantar heel centrally   Multiple view plain film radiographs: 06/19/2023 XR 3 3 views AP lateral oblique of the left foot weightbearing.  Findings: There is been prior resection of the fourth metatarsal head and fourth ray.  No obvious evidence of erosion or osteolysis in the fourth proximal phalanx base.  No soft tissue emphysema noted.  Assessment:   1. Ulcer of left foot with fat layer exposed (HCC)   2. Pain due to onychomycosis of toenails of both feet   3. Cellulitis of left foot     Plan:  Patient was evaluated and treated and all questions answered.  # Left foot ulcer of left fourth toe with fat layer exposed # Cellulitis of left foot -Does appear improved from the ED. - Continue antibiotics from ED.  Adding doxycycline -Weightbearing radiographs performed today and reviewed with the patient in order to better assess the toe and ulceration site. - Prescribing patient 3 days of Augmentin once he completes his course, patient to complete 10 days total of antibiotics as the ED is given him 7-day course - Wound cleansed with wound cleanser, Aquacel Ag and bandage applied - Offloading felt padding applied - Patient to change dressing at home in similar manner every 2 to 3 days - Wound care supplies to be ordered to patient -Limited stance and gait in surgical shoe for now -Plan to order diabetic shoes once healed. -I certify that this  diagnosis represents a distinct and separate diagnosis that requires evaluation and treatment separate from other procedures or diagnosis   # Painful onychomycosis of toenails x 9 -Nail plates x 9 were debrided in thickness and length using sterile nail nippers without incident. - Mechanical bur used to file the nails down thin.       Return in about 2  weeks (around 07/03/2023) for Wound Care.         Bronwen Betters, D.P.M. [facility] / [facility]

## 2023-06-19 NOTE — Patient Instructions (Signed)
Take antibiotics as directed.  May cleanse wound with wound cleanser and apply piece of Aquacel Ag and cover with bandage every 2 to 3 days.  Cover with offloading felt padding.  Limited stance and gait in surgical shoe.

## 2023-07-02 ENCOUNTER — Ambulatory Visit (INDEPENDENT_AMBULATORY_CARE_PROVIDER_SITE_OTHER): Payer: Medicaid Other | Admitting: Podiatry

## 2023-07-02 DIAGNOSIS — L97522 Non-pressure chronic ulcer of other part of left foot with fat layer exposed: Secondary | ICD-10-CM

## 2023-07-02 DIAGNOSIS — E1151 Type 2 diabetes mellitus with diabetic peripheral angiopathy without gangrene: Secondary | ICD-10-CM

## 2023-07-02 DIAGNOSIS — Z89422 Acquired absence of other left toe(s): Secondary | ICD-10-CM

## 2023-07-02 NOTE — Progress Notes (Signed)
  Subjective:  Patient ID: Kenneth Bailey, male    DOB: November 23, 1965,  MRN: [id number]  Chief Complaint  Patient presents with   Diabetic Ulcer    Left foot ulcer, looks a little scabby but otherwise looks good. Last A1c today was 9.3. Takes plavix    58 y.o. male presents today for evaluation of left foot ulceration patient states that he is doing well and has callus/scab over the area now.  Denies pain.  Does not notice it rubbing in shoes.  Completed course of antibiotics.  Denies signs of infection.  Has not noticed any pain to the plantar fibroma nodule at this point or other.  Last A1c 9.3  Review of Systems: Negative except as noted in the HPI. Denies N/V/F/Ch.   Objective:  There were no vitals filed for this visit. There is no height or weight on file to calculate BMI. Constitutional Well developed. Well nourished.  Vascular Weightbearing.  Findings: Warmth within normal limits to lower extremity.  Nonpalpable pedal pulses left foot. Capillary refill intact to all digits.  Calf is soft and supple, no posterior calf or knee pain, negative Homans' sign.  Pedal hair growth absent  Neurologic Normal speech. Oriented to person, place, and time. Epicritic sensation to light touch grossly present bilaterally.  Protective sensation decreased  Dermatologic Left foot ulcer lateral aspect of base of fourth toe has overlying callus and scab.  Upon debridement, measures 0.3 x 0.5 x 0.3 cm with mixed fibro granulation tissue to the base. No drainage.  No periwound erythema or edema noted.  No malodor. Nodule along medial band of plantar fascia left foot nonpalpable today    Orthopedic: Prior fourth and fifth ray amputation site. No gaps in plantar fascia Previous nodule does appear resolved No pain on palpation of plantar medial calcaneal tubercle or plantar heel centrally   Radiographs: Deferred  Assessment:   1. Ulcer of left foot with fat layer exposed (HCC)   2. 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 Type 2 diabetes mellitus with diabetic peripheral angiopathy without gangrene, unspecified whether long term insulin use (HCC)     Plan:  Patient was evaluated and treated and all questions answered.  # Left foot ulcer of left fourth toe with fat layer exposed Verbal consent obtained to perform excisional debridement of the ulceration today using #15 blade without incident.  Hemostasis achieved with compression.  Anesthesia not necessary secondary to neuropathy.  Wound cleansed with wound cleanser.  Prisma and bandage applied. -Prisma dispensed to the patient.  Change dressing every 3 days. -Reviewed offloading with patient.  Felt padding dispensed    # Type 2 diabetes with peripheral angiopathy # History of partial fifth ray amputation -Patient would benefit from use of diabetic shoes.  Custom shoes with inserts x 3 ordered for the patient. Patient educated on diabetes. Discussed proper diabetic foot care and discussed risks and complications of disease. Educated patient in depth on reasons to return to the office immediately should he/she discover anything concerning or new on the feet. All questions answered. Discussed proper shoes as well.   Signs and symptoms of developing infection discussed with patient.   Return in about 2 weeks (around 07/16/2023) for Wound Care.         Bronwen Betters, D.P.M. [facility] / [facility]

## 2023-07-05 ENCOUNTER — Other Ambulatory Visit (HOSPITAL_COMMUNITY): Payer: Self-pay | Admitting: Physician Assistant

## 2023-07-09 NOTE — Progress Notes (Deleted)
  Cardiology Office Note:  .   Date:  07/09/2023  ID:  Kenneth Bailey, DOB Nov 17, 1965, MRN [id number] PCP: Yvonne Kendall, NP  [facility] Providers Cardiologist:  Norman Herrlich, MD { Click to update primary MD,subspecialty MD or APP then REFRESH:1}   Patient Profile: .      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 Rennis Golden on 03/27/23 for mixed dyslipidemia and high risk for ASCVD. Primary cardiologist is Dr. Dulce Sellar.  He reported he tried a number of other therapies to stop smoking but had been unable to quit. He had recently been on ezetimibe with LDL 76, but triglycerides remain elevated.  He was advised to start Repatha for LDL goal 70 or lower.  He reported at office visit with [name], NP on 05/14/23 that he had stopped all of his medications including Repatha and Lopid and was not interested in restarting.        History of Present Illness: .   Kenneth Bailey is a *** 58 y.o. male ***   Discussed the use of AI scribe software for clinical note transcription with the patient, who gave verbal consent to proceed.   ROS: ***       Studies Reviewed: .        *** Risk Assessment/Calculations:   {Does this patient have ATRIAL FIBRILLATION?:[phone]} No BP recorded.  {Refresh Note OR Click here to enter BP  :1}***       Physical Exam:   VS:  There were no vitals taken for this visit.   Wt Readings from Last 3 Encounters:  06/17/23 174 lb 9.7 oz (79.2 kg)  05/31/23 174 lb 11.2 oz (79.2 kg)  05/29/23 172 lb 9.6 oz (78.3 kg)    GEN: Well nourished, well developed in no acute distress NECK: No JVD; No carotid bruits CARDIAC: ***RRR, no  murmurs, rubs, gallops RESPIRATORY:  Clear to auscultation without rales, wheezing or rhonchi  ABDOMEN: Soft, non-tender, non-distended EXTREMITIES:  No edema; No deformity     ASSESSMENT AND PLAN: .    Dyslipidemia LDL goal < 55: CAD    {Are you ordering a CV Procedure (e.g. stress test, cath, DCCV, TEE, etc)?   Press F2        :[id number]}  Disposition:***  Signed, Eligha Bridegroom, NP-C

## 2023-07-10 ENCOUNTER — Telehealth: Payer: Self-pay | Admitting: *Deleted

## 2023-07-10 NOTE — Telephone Encounter (Signed)
S/w pt is aware appt was R/S due to inclement weather. Pt given new time and date.  Pt is doing fine.

## 2023-07-11 ENCOUNTER — Encounter (HOSPITAL_BASED_OUTPATIENT_CLINIC_OR_DEPARTMENT_OTHER): Payer: Medicaid Other | Admitting: Nurse Practitioner

## 2023-07-16 ENCOUNTER — Ambulatory Visit (INDEPENDENT_AMBULATORY_CARE_PROVIDER_SITE_OTHER): Payer: Medicaid Other | Admitting: Podiatry

## 2023-07-16 ENCOUNTER — Encounter: Payer: Self-pay | Admitting: Podiatry

## 2023-07-16 DIAGNOSIS — L97522 Non-pressure chronic ulcer of other part of left foot with fat layer exposed: Secondary | ICD-10-CM | POA: Diagnosis not present

## 2023-07-16 DIAGNOSIS — Z89422 Acquired absence of other left toe(s): Secondary | ICD-10-CM

## 2023-07-16 DIAGNOSIS — E1151 Type 2 diabetes mellitus with diabetic peripheral angiopathy without gangrene: Secondary | ICD-10-CM

## 2023-07-16 NOTE — Progress Notes (Signed)
  Subjective:  Patient ID: Kenneth Bailey, male    DOB: 01/03/1966,  MRN: [id number]  Chief Complaint  Patient presents with   Ulcer Check    Left 5th ulcer check. Looks better then last visit. Last A1c 9.3 two weeks ago, takes Plavix    58 y.o. male presents today for evaluation of left foot ulceration patient states that he is doing well and has callus/scab over the area now.  Denies pain.  Does not notice it rubbing in shoes.  The area does form recurrent scab or callus.  Review of Systems: Negative except as noted in the HPI. Denies N/V/F/Ch.   Objective:  There were no vitals filed for this visit. There is no height or weight on file to calculate BMI. Constitutional Well developed. Well nourished.  Vascular Weightbearing.  Findings: Warmth within normal limits to lower extremity.  Nonpalpable pedal pulses left foot. Capillary refill intact to all digits.  Calf is soft and supple, no posterior calf or knee pain, negative Homans' sign.  Pedal hair growth absent  Neurologic Normal speech. Oriented to person, place, and time. Epicritic sensation to light touch grossly present bilaterally.  Protective sensation decreased  Dermatologic Left foot ulcer lateral aspect of base of fourth toe ulceration present pre-debridement 0.3 x 0.5 x 0.3 cm with mixed fibro granulation tissue to the base. Does probe likely to capsule.  Postdebridement measurement 0.6 x 0.7 x 0.4 cm.  No drainage.  No periwound erythema or edema noted.  No malodor. Nodule along medial band of plantar fascia left foot nonpalpable today    Orthopedic: Prior fourth and fifth ray amputation site. No gaps in plantar fascia Previous nodule does appear resolved No pain on palpation of plantar medial calcaneal tubercle or plantar heel centrally   Radiographs: Deferred  Assessment:   1. Ulcer of left foot with fat layer exposed (HCC)   2. 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 Type 2  diabetes mellitus with diabetic peripheral angiopathy without gangrene, unspecified whether long term insulin use (HCC)      Plan:  Patient was evaluated and treated and all questions answered.  # Left foot ulcer of left fourth toe with fat layer exposed Verbal consent obtained to perform excisional debridement of the ulceration today using #15 blade without incident.  Hemostasis achieved with compression.  Anesthesia not necessary secondary to neuropathy.  Wound cleansed with wound cleanser.  Prisma , Hydrofera Blue and bandage applied.  Offloading pad applied -Prisma dispensed to the patient.  Change dressing every 3 days. -Reviewed offloading with patient.  -Discussed need for limited stance and gait    # Type 2 diabetes with peripheral angiopathy # History of partial fifth ray amputation -Diabetic shoes ordered for patient prior visit.  Signs and symptoms of developing infection discussed with patient.   Return in about 2 weeks (around 07/30/2023) for Wound Care.         Bronwen Betters, D.P.M. [facility] / [facility]

## 2023-07-30 ENCOUNTER — Ambulatory Visit (INDEPENDENT_AMBULATORY_CARE_PROVIDER_SITE_OTHER): Payer: Medicaid Other | Admitting: Podiatry

## 2023-07-30 ENCOUNTER — Encounter: Payer: Self-pay | Admitting: Podiatry

## 2023-07-30 ENCOUNTER — Ambulatory Visit (INDEPENDENT_AMBULATORY_CARE_PROVIDER_SITE_OTHER)

## 2023-07-30 DIAGNOSIS — L97522 Non-pressure chronic ulcer of other part of left foot with fat layer exposed: Secondary | ICD-10-CM | POA: Diagnosis not present

## 2023-07-30 DIAGNOSIS — M79672 Pain in left foot: Secondary | ICD-10-CM

## 2023-07-30 DIAGNOSIS — E1151 Type 2 diabetes mellitus with diabetic peripheral angiopathy without gangrene: Secondary | ICD-10-CM | POA: Diagnosis not present

## 2023-07-30 DIAGNOSIS — Z89422 Acquired absence of other left toe(s): Secondary | ICD-10-CM | POA: Diagnosis not present

## 2023-07-30 NOTE — Progress Notes (Unsigned)
  Subjective:  Patient ID: Kenneth Bailey, male    DOB: [birth year],  MRN: [id number]  Chief Complaint  Patient presents with   Wound Check    Left lateral ulcer, Look closed but he did state that he is now having pain, which is new. Last A1c was 9.3, four weeks ago. Takes Plavix    58 y.o. male presents today for evaluation of left foot ulceration patient states that he is doing well and he believes that this is well-healed.  He does state that it is begun to feel pretty sore over the last couple days however he denies any redness or signs of infection.  Last A1c 9.3.  Does take Plavix.  Review of Systems: Negative except as noted in the HPI. Denies N/V/F/Ch.   Objective:  There were no vitals filed for this visit. There is no height or weight on file to calculate BMI. Constitutional Well developed. Well nourished.  Vascular Weightbearing.  Findings: Warmth within normal limits to lower extremity.  Nonpalpable pedal pulses left foot. Capillary refill intact to all digits.  Calf is soft and supple, no posterior calf or knee pain, negative Homans' sign.  Pedal hair growth absent  Neurologic Normal speech. Oriented to person, place, and time. Epicritic sensation to light touch grossly present bilaterally.  Protective sensation decreased  Dermatologic Left lateral base of fourth toe ulceration appears healed today with some loosely adhered overlying callus.  Mild localized edema around this area without erythema or focal warmth increase    Orthopedic: Prior fourth and fifth ray amputation site. Tenderness on palpation of the prior ulceration site both laterally and plantarly at base of the remaining fourth toe.  Mild localized edema here.  No signs of infection.   Radiographs: Left foot 3 views 07/30/2023 Unchanged from previous demonstrating prior partial fourth and fifth ray resection.  Base of the fourth toe proximal phalanx showing no signs of osteolysis or cortical erosions.  This  area is prominent against the adjacent soft tissue envelope.  Assessment:   1. Left foot pain   2. 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 Type 2 diabetes mellitus with diabetic peripheral angiopathy without gangrene, unspecified whether long term insulin use (HCC)      Plan:  Patient was evaluated and treated and all questions answered.  Radiographs reviewed with patient  Fortunately ulceration appears well-healed to the lateral left forefoot.  Reverse dancers pad applied to offload the area.  Advise close monitoring of the site for signs of reulceration, skin breakdown or signs of infection.  Patient is getting fitted for diabetic shoes sometime in April.  Hopefully this can offload the lateral forefoot and alleviate some of the symptoms as well as reduce risk of reulceration.  Did see vascular provider back in January, did note decreased ABIs with decreased velocities on duplex however no signs of stenosis and are advising monitoring for any new symptoms of ischemia.  Due to his PAD we will continue with conservative management for now.   Return in about 6 weeks (around 09/10/2023) for Diabetic Foot Care.         Bronwen Betters, D.P.M. [facility] / [facility]

## 2023-07-30 NOTE — Patient Instructions (Signed)
 More silicone and felt pads can be purchased from:  [email or web address]  You were given a right sided dancer's pad and a tailor's bunion gel sleeve today to offload the area of ulceration  You may be able to find items like this on [facility]

## 2023-08-23 NOTE — Progress Notes (Signed)
 Cardiology Office Note:  .   Date:  08/27/2023  ID:  Kenneth Bailey, DOB 02-23-1966, MRN [id number] PCP: Kenneth Kendall, NP  [facility] Providers Cardiologist:  Kenneth Herrlich, MD    Patient Profile: .      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 Rennis Bailey on 03/27/23 for mixed dyslipidemia and high risk for ASCVD. Primary cardiologist is Dr. Dulce Bailey. Reported he tried a number of other therapies to stop smoking but had been unable to quit. He had recently been on ezetimibe with LDL 76, but triglycerides remain elevated.  He was advised to start Repatha for LDL goal 70 or lower.   He reported at office visit with Kenneth Bamberg, NP on 05/14/23 that he had stopped all of his medications including Repatha and Lopid and was not interested in restarting.        History of Present Illness: .   Kenneth Bailey is a pleasant 58 y.o. male who is here today for follow-up of dyslipidemia. He reports being on Repatha injections, which he administers every other Monday. He notes side effect of a runny nose after eating, which is not bothersome. No problems with injection. He has discontinued all heart medications except for Plavix due to feeling unwell. After discontinuing the medications, he underwent nuclear stress test and echo and was told by Dr. Dulce Bailey he could stay off the medication. He denies chest pain, shortness of breath, palpitations, orthopnea, PND, presyncope, syncope. He has irritation to his sternotomy scar which he has been using a brush to scratch. History of diabetes with most recent A1c of 9.3, an improvement from a previous level of 15. He has tried to reduce  bread intake to improve management of diabetes. He ate biscutis and gravy before coming here today. He continues to drink soda and coffee. He also reports eating a pack of crackers for lunch and sometimes skipping dinner due to lack of hunger. He is having right shoulder pain which may require surgery.  Admits he is not very physically active.   Discussed the use of AI scribe software for clinical note transcription with the patient, who gave verbal consent to proceed.   ROS: See HPI       Studies Reviewed: Marland Kitchen        No results found for: "LIPOA"   Risk Assessment/Calculations:             Physical Exam:   VS:  BP (!) 92/58 (BP Location: Left Arm, Patient Position: Sitting, Cuff Size: Normal)   Pulse 88   Ht 5\' 8"  (1.727 m)   Wt 178 lb (80.7 kg)   SpO2 95%   BMI 27.06 kg/m    Wt Readings from Last 3 Encounters:  08/27/23 178 lb (80.7 kg)  06/17/23 174 lb 9.7 oz (79.2 kg)  05/31/23 174 lb 11.2 oz (79.2 kg)    GEN: Well nourished, well developed in no acute distress NECK: No JVD; No carotid bruits CARDIAC: RRR, no murmurs, rubs, gallops RESPIRATORY:  Clear to auscultation without rales, wheezing or rhonchi  ABDOMEN: Soft, non-tender, non-distended EXTREMITIES:  No edema; No deformity     ASSESSMENT AND PLAN: .    Dyslipidemia LDL goal < 55: Lipid  panel 04/02/2023 with total cholesterol 171, HDL 18, LDL-C 58, and triglycerides 633. He reports reducing bread intake to lower A1C and triglycerides.  He is tolerating Repatha without significant side effects.  We will get updated NMR for surveillance.  Continue Repatha. Encouraged lifestyle modification to increase physical activity and reduce intake of saturated fat, simple carbohydrates, processed food, and sugar.   CAD: History of CABG x 4 in 05/2021. He denies chest pain, dyspnea, or other symptoms concerning for angina.  No indication for further ischemic evaluation at this time. No bleeding concerns. Continue Plavix. Continue  secondary prevention.   Insulin-dependent diabetes: A1C 9.3 on 07/02/2023. He reports this is an improvement from > 10. Emphasized the importance of healthy mostly plant based diet avoiding saturated fat, processed foods, simple carbohydrates, and sugar along with aiming for at least 150 minutes of moderate intensity exercise each week. Management per PCP.   Tobacco abuse: Continues to smoke 1 ppd down from 3 ppd. Complete cessation advised.        Disposition:Keep your June appointment with Kenneth Bamberg, NP/1 year with Dr. Rennis Bailey or me  Signed, Kenneth Bridegroom, NP-C

## 2023-08-27 ENCOUNTER — Encounter (HOSPITAL_BASED_OUTPATIENT_CLINIC_OR_DEPARTMENT_OTHER): Payer: Self-pay | Admitting: Nurse Practitioner

## 2023-08-27 ENCOUNTER — Ambulatory Visit (HOSPITAL_BASED_OUTPATIENT_CLINIC_OR_DEPARTMENT_OTHER): Payer: Medicaid Other | Admitting: Nurse Practitioner

## 2023-08-27 VITALS — BP 92/58 | HR 88 | Ht 68.0 in | Wt 178.0 lb

## 2023-08-27 DIAGNOSIS — E785 Hyperlipidemia, unspecified: Secondary | ICD-10-CM

## 2023-08-27 DIAGNOSIS — Z720 Tobacco use: Secondary | ICD-10-CM

## 2023-08-27 DIAGNOSIS — E119 Type 2 diabetes mellitus without complications: Secondary | ICD-10-CM

## 2023-08-27 DIAGNOSIS — Z794 Long term (current) use of insulin: Secondary | ICD-10-CM

## 2023-08-27 DIAGNOSIS — Z951 Presence of aortocoronary bypass graft: Secondary | ICD-10-CM

## 2023-08-27 DIAGNOSIS — I2510 Atherosclerotic heart disease of native coronary artery without angina pectoris: Secondary | ICD-10-CM | POA: Diagnosis not present

## 2023-08-27 MED ORDER — REPATHA 140 MG/ML ~~LOC~~ SOSY: 140.0000 mg | PREFILLED_SYRINGE | SUBCUTANEOUS | 3 refills | Status: DC

## 2023-08-27 NOTE — Patient Instructions (Signed)
 Medication Instructions:  Your physician recommends that you continue on your current medications as directed. Please refer to the Current Medication list given to you today.  *If you need a refill on your cardiac medications before your next appointment, please call your pharmacy*  Lab Work: FASTING NMR SOON   If you have labs (blood work) drawn today and your tests are completely normal, you will receive your results only by: MyChart Message (if you have MyChart) OR A paper copy in the mail If you have any lab test that is abnormal or we need to change your treatment, we will call you to review the results.  Testing/Procedures: NONE  Follow-Up: At [facility], you and your health needs are our priority.  As part of our continuing mission to provide you with exceptional heart care, our providers are all part of one team.  This team includes your primary Cardiologist (physician) and Advanced Practice Providers or APPs (Physician Assistants and Nurse Practitioners) who all work together to provide you with the care you need, when you need it.  Your next appointment:   12 month(s)  Provider:   K. Italy Hilty, MD or Eligha Bridegroom, NP    We recommend signing up for the patient portal called "MyChart".  Sign up information is provided on this After Visit Summary.  MyChart is used to connect with patients for Virtual Visits (Telemedicine).  Patients are able to view lab/test results, encounter notes, upcoming appointments, etc.  Non-urgent messages can be sent to your provider as well.   To learn more about what you can do with MyChart, go to [email or web address].

## 2023-08-28 ENCOUNTER — Other Ambulatory Visit: Payer: Self-pay | Admitting: Oncology

## 2023-08-28 ENCOUNTER — Other Ambulatory Visit: Payer: Self-pay

## 2023-08-28 DIAGNOSIS — D500 Iron deficiency anemia secondary to blood loss (chronic): Secondary | ICD-10-CM

## 2023-08-28 NOTE — Progress Notes (Unsigned)
 [facility] Cancer Follow up Visit:  Patient Care Team: Yvonne Kendall, NP as PCP - General (Nurse Practitioner) Baldo Daub, MD as PCP - Cardiology (Cardiology) Louann Sjogren, DPM as Consulting Physician (Podiatry)  CHIEF COMPLAINTS/PURPOSE OF CONSULTATION:  HISTORY OF PRESENTING ILLNESS: Kenneth Bailey 58 y.o. male is here because of anemia  Medical history is notable for history of peptic/duodenal ulcers, type II diabetes, hypertension, hyperlipidemia, congestive heart failure, coronary artery disease-status post CABG. Status post left fifth toe amputation.   January 2023:CTA chest-slightly enlarged lymph nodes in the mediastinum and bilateral hilar areas largest measuring 1.5.  Patchy groundglass densities in both perihilar regions and lower lung fields with small to moderate bilateral pleural effusions.  2.2 x 0.8 hypervascular structure in the anterior aspect of the body of the pancreas.  This may suggest vascular malformation or neoplastic process.  Mild gallbladder wall thickening with questionable cholecystic stranding (physiologic or suggest acute or chronic inflammation).  No solid enhancing pancreas lesion identified. Corresponding to the recent CT abnormality is a focal area relative hypoenhancement measuring 2.2 x 1.0 cm, image 41/17. Does not have the typical appearance of the pancreatic neoplasm in may represent a thrombosed vessel   June 10-28 2024: Admitted to [facility] with sepsis due to osteomyelitis, acute kidney injury, hematemesis.  Creatinine 2.9.  Hydrated.  S/P debridement left foot ulcer, left 5th toe amputation.  June 15-HbG 7.  MCV 85.  Status post 2 units PRBC June 16-EGD-nonbleeding reflux esophagitis, nonbleeding gastric and duodenal ulcers.  Pathology-reactive gastropathy negative for H. pylori, intestinal metaplasia, dysplasia and carcinoma. June 17-HgB 7.1.  Status post 2 units PRBC     July 4 -8 2024: Admitted to [facility] melena.   Hemoglobin 7.8.  Status post 1 unit PRBC             July 5-EGD-diffuse gastritis, non-bleeding duodenal ulcer with a nonbleeding visible vessel found in the bulb. Injected with epinephrine. Treated with bipolar cautery. Clips placed   January 11, 2023: [name], ED with bilateral lower extremity peripheral edema.  Placed on furosemide 20 mg daily and potassium chloride 20 meq daily.  Follow-up with cardiology recommended.             August 23-ferritin 69   January 17, 2023: [name] ED with orthostatic hypotension and hyperkalemia.  Hydrated.  Follow-up with cardiology recommended    September 1- 3 2024: Admitted to [facility] with acute kidney injury, hyperkalemia.  Creatinine 1.9.  Calcium 5.8.  Hydrated             September 3-HgB 9.6.  MCV 81.  Creatinine 1.2.  Potassium 4.1   January 23, 2023: Hematology consult Discharged from [facility] today.  Reports fatigue and pica to ice.  Decreased appetite without weight loss.  Denies melena or hematochezia.Takes clopidogrel 75 mg and aspirin 81 mg daily.    WBC 9.6 hemoglobin 10.3 MCV 86 platelets 372; 77 segs 13 lymphs 5 monos 3 eos 1 Baso 1 immature granulocytes Peripheral smear-normal WBC morphology, RBC morphology-teardrops and polychromasia.    CMP notable for creatinine 1.54 calcium 10.4, glucose 162.  Ferritin 18.  Folate 8.7.  B12 274.   Social history: He currently smokes a half a pack a day. Smoked cigarettes since age 45 at times, up to 3 packs a day for at least 5 years.  He is divorced with no children and lives alone.  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   Family history: He denies any family history of anemia, other hematologic abnormality, or malignancy.  Coombs test negative haptoglobin 326 SPEP with IEP but no paraprotein serum kappa 47.8 lambda 21.7 ratio 2.2 IgG 903 IgA 172 IgM 63  January 30, 2023: Feraheme 510 mg CMP notable for potassium 5.8 glucose 246 creatinine  1.65  February 05 2023: Feraheme 510 mg  February 06, 2023:  States he is not taking diclofenac.  He is taking MVI with iron.  Takes ASA and Plavix.  He is down to 1 or 2 cigarettes daily.  Does not drink EtOH.   No hematochezia, melena.  No pica to ice currently but did have it a few weeks ago.  Energy level OK.      Follow up in 1 month to assess Hgb and iron stores.    February 20 2023:  Excisional debridement of left lateral forefoot ulcer  March 07 2023:  He is back up to smoking 1 ppd of cigarettes daily.  Discussed importance of smoking cessation Ferritin 81 Hgb 13.9  May 31 2023:  Scheduled follow-up for anemia.  Since last visit underwent partial amputation of left foot.   Remains on Plavix.  Checks FSBG's in the AM-- it was 91 this morning.  Down to 1/2 ppd of cigarettes.  Takes oral iron occasionally Hgb 15.7   Ferritin, folate, B12 pending    Review of Systems - Oncology  MEDICAL HISTORY: Past Medical History:  Diagnosis Date  . Acute combined systolic and diastolic heart failure (HCC)   . Acute systolic heart failure (HCC) 06/06/2021  . Anemia   . Atherosclerotic heart disease 09/07/2021  . CHF (congestive heart failure) (HCC)   . Chronic systolic CHF (congestive heart failure) (HCC) 11/23/2022  . COPD (chronic obstructive pulmonary disease) (HCC)   . Diabetes mellitus, type 2 (HCC)   . Gastric ulcer   . HLD (hyperlipidemia)   . Hypertension   . Hypertension associated with diabetes (HCC) 09/29/2022  . Hypertensive heart disease with congestive heart failure (HCC) 03/28/2023  . Kidney disease, chronic, stage III (GFR 30-59 ml/min) (HCC)   . Multiple duodenal ulcers   . Orthostatic hypotension   . Osteomyelitis (HCC)   . Peripheral arterial disease (HCC)   . Postural hypotension 12/20/2016  . PUD (peptic ulcer disease)   . Tobacco abuse     SURGICAL HISTORY: Past Surgical History:  Procedure Laterality Date  . ABDOMINAL AORTOGRAM W/LOWER EXTREMITY N/A  11/03/2022   Procedure: ABDOMINAL AORTOGRAM W/LOWER EXTREMITY;  Surgeon: Cephus Shelling, MD;  [facility];  Service: Cardiovascular;  Laterality: N/A;  . AMPUTATION Left 10/31/2022   Procedure: AMPUTATION LEFT FIFTH TOE;  Surgeon: Louann Sjogren, DPM;  Location: [facility] OR;  Service: Podiatry;  Laterality: Left;  . AMPUTATION Left 11/15/2022   Procedure: AMPUTATION FITH RAY;  Surgeon: Felecia Shelling, DPM;  Location: [facility] OR;  Service: Podiatry;  Laterality: Left;  . AMPUTATION TOE Left 10/31/2022   Procedure: IRRIGATION AND DEBRIDEMENTOF LEFT FOOT;  Surgeon: Louann Sjogren, DPM;  Location: [facility] OR;  Service: Podiatry;  Laterality: Left;  . BIOPSY  11/05/2022   Procedure: BIOPSY;  Surgeon: Jeani Hawking, MD;  Location: [facility] ENDOSCOPY;  Service: Gastroenterology;;  . CORONARY ARTERY BYPASS GRAFT N/A 06/10/2021   Procedure: CORONARY ARTERY BYPASS GRAFTING (CABG) TIMES FOUR, USING LEFT INTERNAL MAMMARY ARTERY AND RIGHT GREATER SAPHENOUS VEIN HARVESTED ENDOSCOPICALLY;  Surgeon: Lovett Sox, MD;  Location: [facility] OR;  Service: Open Heart Surgery;  Laterality: N/A;  . ENDOVEIN HARVEST OF GREATER SAPHENOUS VEIN Right 06/10/2021   Procedure: ENDOVEIN HARVEST OF GREATER SAPHENOUS VEIN;  Surgeon: Lovett Sox, MD;  Location: [facility] OR;  Service: Open Heart Surgery;  Laterality: Right;  . ESOPHAGOGASTRODUODENOSCOPY N/A 11/24/2022   Procedure: ESOPHAGOGASTRODUODENOSCOPY (EGD);  Surgeon: Lemar Lofty., MD;  Location: [facility] ENDOSCOPY;  Service: Gastroenterology;  Laterality: N/A;  . ESOPHAGOGASTRODUODENOSCOPY (EGD) WITH PROPOFOL N/A 11/05/2022   Procedure: ESOPHAGOGASTRODUODENOSCOPY (EGD) WITH PROPOFOL;  Surgeon: Jeani Hawking, MD;  Location: [facility] ENDOSCOPY;  Service: Gastroenterology;  Laterality: N/A;  . HEMOSTASIS CLIP PLACEMENT  11/24/2022   Procedure: HEMOSTASIS CLIP PLACEMENT;  Surgeon: Lemar Lofty., MD;  Location: [facility] ENDOSCOPY;  Service: Gastroenterology;;  . HEMOSTASIS CONTROL  11/24/2022    Procedure: HEMOSTASIS CONTROL;  Surgeon: Lemar Lofty., MD;  Location: [facility] ENDOSCOPY;  Service: Gastroenterology;;  . HOT HEMOSTASIS N/A 11/24/2022   Procedure: HOT HEMOSTASIS (ARGON PLASMA COAGULATION/BICAP);  Surgeon: Lemar Lofty., MD;  Location: [facility] ENDOSCOPY;  Service: Gastroenterology;  Laterality: N/A;  . I & D EXTREMITY Left 11/08/2022   Procedure: IRRIGATION AND DEBRIDEMENT OF FOOT AND WASHOUT;  Surgeon: Vivi Barrack, DPM;  Location: [facility] OR;  Service: Podiatry;  Laterality: Left;  . PERIPHERAL VASCULAR BALLOON ANGIOPLASTY  11/03/2022   Procedure: PERIPHERAL VASCULAR BALLOON ANGIOPLASTY;  Surgeon: Cephus Shelling, MD;  [facility];  Service: Cardiovascular;;  . RIGHT/LEFT HEART CATH AND CORONARY ANGIOGRAPHY N/A 06/06/2021   Procedure: RIGHT/LEFT HEART CATH AND CORONARY ANGIOGRAPHY;  Surgeon: Dolores Patty, MD;  [facility];  Service: Cardiovascular;  Laterality: N/A;  . TEE WITHOUT CARDIOVERSION N/A 06/10/2021   Procedure: TRANSESOPHAGEAL ECHOCARDIOGRAM (TEE);  Surgeon: Lovett Sox, MD;  Location: [facility] OR;  Service: Open Heart Surgery;  Laterality: N/A;    SOCIAL HISTORY: Social History   Socioeconomic History  . Marital status: Divorced    Spouse name: Not on file  . Number of children: Not on file  . Years of education: Not on file  . Highest education level: 7th grade  Occupational History  . Occupation: out of work at the present time  Tobacco Use  . Smoking status: Every Day    Current packs/day: 1.00    Average packs/day: 1 pack/day for 51.6 years (51.6 ttl pk-yrs)    Types: Cigarettes    Start date: 01/23/1972    Passive exposure: Never  . Smokeless tobacco: Never  . Tobacco comments:    Trying to quit with Chantix-12/2022  Vaping Use  . Vaping status: Never Used  Substance and Sexual Activity  . Alcohol use: Yes    Comment: occasionally beer every other weekend  . Drug use: Never  . Sexual  activity: Not on file  Other Topics Concern  . Not on file  Social History Narrative  . Not on file   Social Drivers of Health   Financial Resource Strain: Not at Risk (06/14/2022)   Received from [facility]   . Financial Resource Strain: 1  Food Insecurity: No Food Insecurity (01/23/2023)   Hunger Vital Sign   . Worried About Programme researcher, broadcasting/film/video in the Last Year: Never true   . Ran Out of Food in the Last Year: Never true  Transportation Needs: No Transportation Needs (01/23/2023)   PRAPARE - Transportation   . Lack of Transportation (Medical): No   . Lack of Transportation (Non-Medical): No  Physical Activity: Not at Risk (06/14/2022)   Received from [place],  [facility]   Physical Activity   . Physical Activity: 1  Stress: Not at Risk (06/14/2022)   Received from [place], [place]   Stress   . Stress: 1  Social Connections: Not at Risk (06/14/2022)   Received from [employer]   . Connectedness: 1  Intimate Partner Violence: Not At Risk (01/23/2023)   Humiliation, Afraid, Rape, and Kick questionnaire   . Fear of Current or Ex-Partner: No   . Emotionally Abused: No   . Physically Abused: No   . Sexually Abused: No    FAMILY HISTORY Family History  Problem Relation Age of Onset  . Hypertension Mother   . Cancer Mother   . Heart disease Father     ALLERGIES:  is allergic to lisinopril and statins.  MEDICATIONS:  Current Outpatient Medications  Medication Sig Dispense Refill  . clopidogrel (PLAVIX) 75 MG tablet Take 1 tablet (75 mg total) by mouth daily. 90 tablet 3  . Evolocumab (REPATHA) 140 MG/ML SOSY Inject 140 mg into the skin every 14 (fourteen) days. 6 mL 3  . gabapentin (NEURONTIN) 600 MG tablet Take 600 mg by mouth daily as needed (nerve pain). Patient reports taking twice daily    . insulin aspart (NOVOLOG) 100 UNIT/ML injection Inject 0-9 Units into the skin 3 (three) times daily with meals. CBG < 70: Implement Hypoglycemia  Standing Orders CBG 70 - 120: 0 units CBG 121 - 150: 1 unit CBG 151 - 200: 2 units CBG 201 - 250: 3 units CBG 251 - 300: 5 units CBG 301 - 350: 7 units CBG 351 - 400: 9 units CBG > 400: call MD and obtain STAT lab verification 10 mL 0  . Insulin Syringe-Needle U-100 30G X 1/2" 0.3 ML MISC Use with Novolog 90 each 0  . metFORMIN (GLUCOPHAGE) 1000 MG tablet Take 1,000 mg by mouth 2 (two) times daily.    . mupirocin ointment (BACTROBAN) 2 % Apply 1 Application topically daily. 22 g 0  . polyethylene glycol (MIRALAX / GLYCOLAX) 17 g packet Take 17 g by mouth daily as needed for mild constipation. 14 each 0  . SEMGLEE, YFGN, 100 UNIT/ML Pen Inject 100 Units into the skin every 14 (fourteen) days.    . Study - DAPA TIMI 68 - dapagliflozin (FARXIGA) 10 mg or placebo tablet (PI-Dalton McLean) Take 1 tablet by mouth in the morning and at bedtime.     No current facility-administered medications for this visit.    PHYSICAL EXAMINATION:  ECOG PERFORMANCE STATUS: 1 - Symptomatic but completely ambulatory   There were no vitals filed for this visit.    There were no vitals filed for this visit.     Physical Exam Vitals and nursing note reviewed.  Constitutional:      General: He is not in acute distress.    Appearance: Normal appearance. He is normal weight. He is not diaphoretic.  HENT:     Head: Normocephalic and atraumatic.     Right Ear: External ear normal.     Left Ear: External ear normal.     Nose: Nose normal.  Eyes:     General: No scleral icterus.    Conjunctiva/sclera: Conjunctivae normal.     Pupils: Pupils are equal, round, and reactive to light.  Cardiovascular:     Rate and Rhythm: Normal rate and regular rhythm.     Heart sounds: Normal heart sounds. No murmur heard.    No friction rub. No gallop.  Pulmonary:  Effort: Pulmonary effort is normal. No respiratory distress.     Breath sounds: Normal breath sounds. No wheezing.  Abdominal:     General: Bowel sounds  are normal.     Palpations: Abdomen is soft.     Tenderness: There is no abdominal tenderness. There is no guarding or rebound.  Musculoskeletal:        General: No swelling.     Cervical back: Normal range of motion and neck supple. No rigidity or tenderness.     Right lower leg: No edema.     Left lower leg: No edema.  Lymphadenopathy:     Head:     Right side of head: No submental, submandibular, tonsillar, preauricular, posterior auricular or occipital adenopathy.     Left side of head: No submental, submandibular, tonsillar, preauricular, posterior auricular or occipital adenopathy.     Cervical: No cervical adenopathy.     Right cervical: No superficial, deep or posterior cervical adenopathy.    Left cervical: No superficial, deep or posterior cervical adenopathy.     Upper Body:     Right upper body: No supraclavicular or axillary adenopathy.     Left upper body: No supraclavicular or axillary adenopathy.     Lower Body: No right inguinal adenopathy. No left inguinal adenopathy.  Skin:    Coloration: Skin is not jaundiced or pale.     Findings: No erythema.  Neurological:     General: No focal deficit present.     Mental Status: He is alert and oriented to person, place, and time.     Cranial Nerves: No cranial nerve deficit.  Psychiatric:        Mood and Affect: Mood normal.        Behavior: Behavior normal.        Thought Content: Thought content normal.        Judgment: Judgment normal.    LABORATORY DATA: I have personally reviewed the data as listed:  No visits with results within 1 Month(s) from this visit.  Latest known visit with results is:  Admission on 06/17/2023, Discharged on 06/18/2023  Component Date Value Ref Range Status  . WBC 06/17/2023 8.7  4.0 - 10.5 K/uL Final  . RBC 06/17/2023 5.07  4.22 - 5.81 MIL/uL Final  . Hemoglobin 06/17/2023 14.0  13.0 - 17.0 g/dL Final  . HCT 16/02/9603 42.7  39.0 - 52.0 % Final  . MCV 06/17/2023 84.2  80.0 - 100.0 fL  Final  . MCH 06/17/2023 27.6  26.0 - 34.0 pg Final  . MCHC 06/17/2023 32.8  30.0 - 36.0 g/dL Final  . RDW 54/01/8118 13.6  11.5 - 15.5 % Final  . Platelets 06/17/2023 301  150 - 400 K/uL Final  . nRBC 06/17/2023 0.0  0.0 - 0.2 % Final  . Neutrophils Relative % 06/17/2023 67  % Final  . Neutro Abs 06/17/2023 5.9  1.7 - 7.7 K/uL Final  . Lymphocytes Relative 06/17/2023 23  % Final  . Lymphs Abs 06/17/2023 2.0  0.7 - 4.0 K/uL Final  . Monocytes Relative 06/17/2023 6  % Final  . Monocytes Absolute 06/17/2023 0.5  0.1 - 1.0 K/uL Final  . Eosinophils Relative 06/17/2023 2  % Final  . Eosinophils Absolute 06/17/2023 0.2  0.0 - 0.5 K/uL Final  . Basophils Relative 06/17/2023 1  % Final  . Basophils Absolute 06/17/2023 0.1  0.0 - 0.1 K/uL Final  . Immature Granulocytes 06/17/2023 1  % Final  . Abs Immature Granulocytes  06/17/2023 0.04  0.00 - 0.07 K/uL Final   Performed at [facility], [address], [place], [place] [address]  . Sodium 06/17/2023 138  135 - 145 mmol/L Final  . Potassium 06/17/2023 4.4  3.5 - 5.1 mmol/L Final  . Chloride 06/17/2023 101  98 - 111 mmol/L Final  . CO2 06/17/2023 26  22 - 32 mmol/L Final  . Glucose, Bld 06/17/2023 315 (H)  70 - 99 mg/dL Final   Glucose reference range applies only to samples taken after fasting for at least 8 hours.  . BUN 06/17/2023 19  6 - 20 mg/dL Final  . Creatinine, Ser 06/17/2023 0.99  0.61 - 1.24 mg/dL Final  . Calcium 60/45/4098 9.2  8.9 - 10.3 mg/dL Final  . Total Protein 06/17/2023 7.0  6.5 - 8.1 g/dL Final  . Albumin 11/91/4782 3.5  3.5 - 5.0 g/dL Final  . AST 95/62/1308 17  15 - 41 U/L Final  . ALT 06/17/2023 18  0 - 44 U/L Final  . Alkaline Phosphatase 06/17/2023 132 (H)  38 - 126 U/L Final  . Total Bilirubin 06/17/2023 0.5  0.0 - 1.2 mg/dL Final  . GFR, Estimated 06/17/2023 >60  >60 mL/min Final   Comment: (NOTE) Calculated using the CKD-EPI Creatinine Equation (2021)   . Anion gap 06/17/2023 11  5 - 15 Final    Performed at [facility], [address], [place], [place] [address]  . Color, Urine 06/17/2023 YELLOW  YELLOW Final  . APPearance 06/17/2023 CLEAR  CLEAR Final  . Specific Gravity, Urine 06/17/2023 1.031 (H)  1.005 - 1.030 Final  . pH 06/17/2023 6.0  5.0 - 8.0 Final  . Glucose, UA 06/17/2023 >=500 (A)  NEGATIVE mg/dL Final  . Hgb urine dipstick 06/17/2023 NEGATIVE  NEGATIVE Final  . Bilirubin Urine 06/17/2023 NEGATIVE  NEGATIVE Final  . Ketones, ur 06/17/2023 NEGATIVE  NEGATIVE mg/dL Final  . Protein, ur 69/62/9528 100 (A)  NEGATIVE mg/dL Final  . Nitrite 41/32/4401 NEGATIVE  NEGATIVE Final  . Glori Luis 06/17/2023 NEGATIVE  NEGATIVE Final  . RBC / HPF 06/17/2023 0-5  0 - 5 RBC/hpf Final  . WBC, UA 06/17/2023 0-5  0 - 5 WBC/hpf Final  . Bacteria, UA 06/17/2023 RARE (A)  NONE SEEN Final  . Squamous Epithelial / HPF 06/17/2023 0-5  0 - 5 /HPF Final   Performed at [facility], [address] [place], [place] [address]  . Sed Rate 06/17/2023 40 (H)  0 - 16 mm/hr Final   Performed at [facility], [address], [place], [place] [address]    RADIOGRAPHIC STUDIES: I have personally reviewed the radiological images as listed and agree with the findings in the report  No results found.   ASSESSMENT/PLAN 58 y.o. male is here because of anemia.  Medical history is notable for history of peptic/duodenal ulcers, type II diabetes, hypertension, hyperlipidemia, congestive heart failure, coronary artery disease-status post CABG. Status post left fifth toe amputation.  Anemia:  Etiology multifactorial with culprits being 1) Iron deficiency from GI blood loss exacerbated by antiplatelet therapy 2) CKD 3) Chronic inflammation   February 04 2024- Feraheme 510 mg   February 05 2023- Feraheme 510 mg March 07 2023- Hgb 13.9 Ferritin 81.  Continue MVI with iron May 31 2023- Hgb 15.7.  Ferritin, folate, B12 pending.  Continue MVI with iron   Chronic kidney  disease  February 06 2023- Likely related to DM Type II,  CHF, PVD and HTN.    SPEP with IEP and free light chains from earlier this month negative for paraprotein    May 31 2023- Cr 1.1   Tobacco use  February 06 2023- Discussed strategies for smoking cessation  March 07 2023- Continued encouragement for smoking cessation particularly given severity of PVD  May 31 2023- Has decreased use to 1/2 ppd of cigarettes.  Referring for CT lung screening   Cancer Staging  No matching staging information was found for the patient.    No problem-specific Assessment & Plan notes found for this encounter.    No orders of the defined types were placed in this encounter.   30  minutes was spent in patient care.  This included time spent preparing to see the patient (e.g., review of tests), obtaining and/or reviewing separately obtained history, counseling and educating the patient/family/caregiver, ordering medications, tests, or procedures; documenting clinical information in the electronic or other health record, independently interpreting results and communicating results to the patient/family/caregiver as well as coordination of care.       All questions were answered. The patient knows to call the clinic with any problems, questions or concerns.  This note was electronically signed.    Weston Settle, MD  08/28/2023 7:46 PM

## 2023-08-28 NOTE — Progress Notes (Unsigned)
 [facility]  [address] [place],  [place]  [address] [phone]  Clinic Day:  08/29/2023  Referring physician: Yvonne Kendall, NP   HISTORY OF PRESENT ILLNESS:  The patient is a 58 y.o. male with anemia secondary to multiple factors, including iron deficiency from GI blood loss and chronic renal insufficiency.   In July 2024, an EGD was done, which revealed a duodenal ulcer and a nonbleeding visible vessel in the same area.  This area was cauterized.  With respect to his anemia, he did require blood transfusions in 2024.  He last received IV iron in September 2024.  He comes in today for routine follow-up.  Since his last visit, the patient has been doing fairly well.  He denies having any overt forms of GI blood loss or increased fatigue which concerns him for worsening anemia.  PHYSICAL EXAM:  Blood pressure 134/89, pulse 88, temperature 98 F (36.7 C), temperature source Oral, resp. rate 18, height 5\' 8"  (1.727 m), weight 175 lb 9.6 oz (79.7 kg), SpO2 98%. Wt Readings from Last 3 Encounters:  08/29/23 175 lb 9.6 oz (79.7 kg)  08/27/23 178 lb (80.7 kg)  06/17/23 174 lb 9.7 oz (79.2 kg)   Body mass index is 26.7 kg/m. Performance status (ECOG): 1 - Symptomatic but completely ambulatory Physical Exam Constitutional:      Appearance: Normal appearance. He is not ill-appearing.  HENT:     Mouth/Throat:     Mouth: Mucous membranes are moist.     Pharynx: Oropharynx is clear. No oropharyngeal exudate or posterior oropharyngeal erythema.  Cardiovascular:     Rate and Rhythm: Normal rate and regular rhythm.     Heart sounds: No murmur heard.    No friction rub. No gallop.  Pulmonary:     Effort: Pulmonary effort is normal. No respiratory distress.     Breath sounds: Normal breath sounds. No wheezing, rhonchi or rales.  Abdominal:     General: Bowel sounds are normal. There is no distension.     Palpations: Abdomen is soft. There is no mass.      Tenderness: There is no abdominal tenderness.  Musculoskeletal:        General: No swelling.     Right lower leg: No edema.     Left lower leg: No edema.  Lymphadenopathy:     Cervical: No cervical adenopathy.     Upper Body:     Right upper body: No supraclavicular or axillary adenopathy.     Left upper body: No supraclavicular or axillary adenopathy.     Lower Body: No right inguinal adenopathy. No left inguinal adenopathy.  Skin:    General: Skin is warm.     Coloration: Skin is not jaundiced.     Findings: No lesion or rash.  Neurological:     General: No focal deficit present.     Mental Status: He is alert and oriented to person, place, and time. Mental status is at baseline.  Psychiatric:        Mood and Affect: Mood normal.        Behavior: Behavior normal.        Thought Content: Thought content normal.     LABS:      Latest Ref Rng & Units 08/29/2023    9:13 AM 06/17/2023    8:30 PM 05/31/2023   11:05 AM  CBC  WBC 4.0 - 10.5 K/uL 7.5  8.7  7.7   Hemoglobin 13.0 -  17.0 g/dL 41.3  24.4  01.0   Hematocrit 39.0 - 52.0 % 45.7  42.7  46.0   Platelets 150 - 400 K/uL 233  301  232       Latest Ref Rng & Units 08/29/2023    9:13 AM 06/17/2023    8:30 PM 05/31/2023   11:05 AM  CMP  Glucose 70 - 99 mg/dL 272  536  644   BUN 6 - 20 mg/dL 32  19  14   Creatinine 0.61 - 1.24 mg/dL 0.34  7.42  5.95   Sodium 135 - 145 mmol/L 134  138  140   Potassium 3.5 - 5.1 mmol/L 4.2  4.4  4.0   Chloride 98 - 111 mmol/L 97  101  100   CO2 22 - 32 mmol/L 27  26  27    Calcium 8.9 - 10.3 mg/dL 9.7  9.2  63.8   Total Protein 6.5 - 8.1 g/dL 7.5  7.0  7.5   Total Bilirubin 0.0 - 1.2 mg/dL 0.5  0.5  0.6   Alkaline Phos 38 - 126 U/L 152  132  147   AST 15 - 41 U/L 18  17  17    ALT 0 - 44 U/L 20  18  12     Iron studies pending   ASSESSMENT & PLAN:  Assessment/Plan:  A 58 y.o. male with anemia secondary to iron deficiency from previous GI blood loss, as well as mild renal insufficiency.  I am  very pleased with his hemoglobin of 15.8 today.  His iron parameters also show  As he is stable from a hematologic standpoint, I will see him back in 6 months for repeat clinical assessment. The patient understands all the plans discussed today and is in agreement with them.    Dione Mccombie Kirby Funk, MD

## 2023-08-29 ENCOUNTER — Other Ambulatory Visit: Payer: Self-pay | Admitting: Oncology

## 2023-08-29 ENCOUNTER — Telehealth: Payer: Self-pay | Admitting: Oncology

## 2023-08-29 ENCOUNTER — Inpatient Hospital Stay (HOSPITAL_BASED_OUTPATIENT_CLINIC_OR_DEPARTMENT_OTHER): Payer: Medicaid Other | Admitting: Oncology

## 2023-08-29 ENCOUNTER — Inpatient Hospital Stay: Payer: Medicaid Other | Attending: Hematology and Oncology

## 2023-08-29 VITALS — BP 134/89 | HR 88 | Temp 98.0°F | Resp 18 | Ht 68.0 in | Wt 175.6 lb

## 2023-08-29 DIAGNOSIS — K922 Gastrointestinal hemorrhage, unspecified: Secondary | ICD-10-CM | POA: Diagnosis present

## 2023-08-29 DIAGNOSIS — D649 Anemia, unspecified: Secondary | ICD-10-CM

## 2023-08-29 DIAGNOSIS — D500 Iron deficiency anemia secondary to blood loss (chronic): Secondary | ICD-10-CM

## 2023-08-29 DIAGNOSIS — N289 Disorder of kidney and ureter, unspecified: Secondary | ICD-10-CM | POA: Insufficient documentation

## 2023-08-29 LAB — CMP (CANCER CENTER ONLY)
ALT: 20 U/L (ref 0–44)
AST: 18 U/L (ref 15–41)
Albumin: 4.3 g/dL (ref 3.5–5.0)
Alkaline Phosphatase: 152 U/L — ABNORMAL HIGH (ref 38–126)
Anion gap: 10 (ref 5–15)
BUN: 32 mg/dL — ABNORMAL HIGH (ref 6–20)
CO2: 27 mmol/L (ref 22–32)
Calcium: 9.7 mg/dL (ref 8.9–10.3)
Chloride: 97 mmol/L — ABNORMAL LOW (ref 98–111)
Creatinine: 1.25 mg/dL — ABNORMAL HIGH (ref 0.61–1.24)
GFR, Estimated: >60 mL/min (ref >60)
Glucose, Bld: 187 mg/dL — ABNORMAL HIGH (ref 70–99)
Potassium: 4.2 mmol/L (ref 3.5–5.1)
Sodium: 134 mmol/L — ABNORMAL LOW (ref 135–145)
Total Bilirubin: 0.5 mg/dL (ref 0.0–1.2)
Total Protein: 7.5 g/dL (ref 6.5–8.1)

## 2023-08-29 LAB — FOLATE: Folate: 9.5 ng/mL (ref >5.9)

## 2023-08-29 LAB — IRON AND TIBC
Iron: 78 ug/dL (ref 45–182)
Saturation Ratios: 19 % (ref 17.9–39.5)
TIBC: 406 ug/dL (ref 250–450)
UIBC: 328 ug/dL

## 2023-08-29 LAB — NMR, LIPOPROFILE
Cholesterol, Total: 118 mg/dL (ref 100–199)
HDL Particle Number: 22 umol/L — ABNORMAL LOW (ref >=30.5)
HDL-C: 18 mg/dL — ABNORMAL LOW (ref >39)
LDL Particle Number: 448 nmol/L (ref <1000)
LDL Size: 19.6 nm — ABNORMAL LOW (ref >20.5)
LDL-C (NIH Calc): 11 mg/dL (ref 0–99)
LP-IR Score: 92 — ABNORMAL HIGH (ref <=45)
Small LDL Particle Number: 383 nmol/L (ref <=527)
Triglycerides: 719 mg/dL (ref 0–149)

## 2023-08-29 LAB — CBC WITH DIFFERENTIAL (CANCER CENTER ONLY)
Abs Immature Granulocytes: 0.03 10*3/uL (ref 0.00–0.07)
Basophils Absolute: 0.1 10*3/uL (ref 0.0–0.1)
Basophils Relative: 1 %
Eosinophils Absolute: 0.1 10*3/uL (ref 0.0–0.5)
Eosinophils Relative: 2 %
HCT: 45.7 % (ref 39.0–52.0)
Hemoglobin: 15.8 g/dL (ref 13.0–17.0)
Immature Granulocytes: 0 %
Lymphocytes Relative: 22 %
Lymphs Abs: 1.7 10*3/uL (ref 0.7–4.0)
MCH: 28.6 pg (ref 26.0–34.0)
MCHC: 34.6 g/dL (ref 30.0–36.0)
MCV: 82.6 fL (ref 80.0–100.0)
Monocytes Absolute: 0.5 10*3/uL (ref 0.1–1.0)
Monocytes Relative: 6 %
Neutro Abs: 5.2 10*3/uL (ref 1.7–7.7)
Neutrophils Relative %: 69 %
Platelet Count: 233 10*3/uL (ref 150–400)
RBC: 5.53 MIL/uL (ref 4.22–5.81)
RDW: 13.9 % (ref 11.5–15.5)
WBC Count: 7.5 10*3/uL (ref 4.0–10.5)
nRBC: 0 % (ref 0.0–0.2)
nRBC: 0 /100{WBCs}

## 2023-08-29 LAB — VITAMIN B12: Vitamin B-12: 231 pg/mL (ref 180–914)

## 2023-08-29 LAB — FERRITIN: Ferritin: 34 ng/mL (ref 24–336)

## 2023-08-29 NOTE — Telephone Encounter (Signed)
 Patient has been scheduled for follow-up visit per 08/28/23 LOS.  Pt given an appt calendar with date and time.

## 2023-08-30 ENCOUNTER — Encounter: Payer: Self-pay | Admitting: Hematology and Oncology

## 2023-08-30 ENCOUNTER — Telehealth: Payer: Self-pay | Admitting: Nurse Practitioner

## 2023-08-30 ENCOUNTER — Telehealth: Payer: Self-pay | Admitting: Pharmacy Technician

## 2023-08-30 ENCOUNTER — Encounter (HOSPITAL_BASED_OUTPATIENT_CLINIC_OR_DEPARTMENT_OTHER): Payer: Self-pay

## 2023-08-30 ENCOUNTER — Other Ambulatory Visit (HOSPITAL_COMMUNITY): Payer: Self-pay

## 2023-08-30 LAB — SOLUBLE TRANSFERRIN RECEPTOR: Transferrin Receptor: 23.8 nmol/L (ref 12.2–27.3)

## 2023-08-30 MED ORDER — ICOSAPENT ETHYL 1 G PO CAPS: 2.0000 g | ORAL_CAPSULE | Freq: Two times a day (BID) | ORAL | 3 refills | Status: AC

## 2023-08-30 NOTE — Telephone Encounter (Signed)
 Triglycerides are > 700. Would like to add Vascepa 2 grams twice daily. Please advise regarding cost/coverage.  Thank you.

## 2023-08-30 NOTE — Telephone Encounter (Signed)
 Results called to patient who verbalizes understanding! Rx team already got prior auth approval on medication, per patient, pharmacy let him know it will be ready for pick up tomorrow.

## 2023-08-30 NOTE — Telephone Encounter (Signed)
 Hi! I called his pharmacy and they filled the icosapent Ethyl 1 g capsules for 90 days and it was 4.00. They are getting it ready for him now but they have to order it. I called the patient and made him aware.   08/30/23  8:07 AM Swinyer, Kenneth George, NP routed this conversation to Rx Prior Auth Team Levi Aland, NP     08/30/23  8:07 AM Note Triglycerides are > 700. Would like to add Vascepa 2 grams twice daily. Please advise regarding cost/coverage.   Thank you.

## 2023-09-25 ENCOUNTER — Other Ambulatory Visit: Payer: Medicaid Other

## 2023-09-25 ENCOUNTER — Encounter: Payer: Self-pay | Admitting: Podiatry

## 2023-09-25 ENCOUNTER — Ambulatory Visit: Admitting: Podiatry

## 2023-09-25 DIAGNOSIS — Z89422 Acquired absence of other left toe(s): Secondary | ICD-10-CM

## 2023-09-25 DIAGNOSIS — M79674 Pain in right toe(s): Secondary | ICD-10-CM | POA: Diagnosis not present

## 2023-09-25 DIAGNOSIS — L97521 Non-pressure chronic ulcer of other part of left foot limited to breakdown of skin: Secondary | ICD-10-CM | POA: Diagnosis not present

## 2023-09-25 DIAGNOSIS — E1151 Type 2 diabetes mellitus with diabetic peripheral angiopathy without gangrene: Secondary | ICD-10-CM

## 2023-09-25 DIAGNOSIS — B351 Tinea unguium: Secondary | ICD-10-CM

## 2023-09-25 DIAGNOSIS — M79675 Pain in left toe(s): Secondary | ICD-10-CM

## 2023-09-25 NOTE — Progress Notes (Unsigned)
 Chief Complaint  Patient presents with   [facility]    [facility] with out callous. Last A1c 9.3 in Feb. Takes plavix .     HPI: 58 y.o. male presents today for diabetic footcare.  He complains of painful, thickened, elongated, dystrophic toenails that he is unable to maintain himself due to thickness and length.  They become painful with shoe gear and direct pressure due to their dystrophic nature.  He has history of poorly controlled diabetes, last A1c 9.3, PAD, history of left partial 4th and 5th ray resection. He has dealt with recurrent ulceration to the lateral base of the remaining left 4th toe.  Past Medical History:  Diagnosis Date   Acute combined systolic and diastolic heart failure (HCC)    Acute systolic heart failure (HCC) 06/06/2021   Anemia    Atherosclerotic heart disease 09/07/2021   CHF (congestive heart failure) (HCC)    Chronic systolic CHF (congestive heart failure) (HCC) 11/23/2022   COPD (chronic obstructive pulmonary disease) (HCC)    Diabetes mellitus, type 2 (HCC)    Gastric ulcer    HLD (hyperlipidemia)    Hypertension    Hypertension associated with diabetes (HCC) 09/29/2022   Hypertensive heart disease with congestive heart failure (HCC) 03/28/2023   Kidney disease, chronic, stage III (GFR 30-59 ml/min) (HCC)    Multiple duodenal ulcers    Orthostatic hypotension    Osteomyelitis (HCC)    Peripheral arterial disease (HCC)    Postural hypotension 12/20/2016   PUD (peptic ulcer disease)    Tobacco abuse     Past Surgical History:  Procedure Laterality Date   ABDOMINAL AORTOGRAM W/LOWER EXTREMITY N/A 11/03/2022   Procedure: ABDOMINAL AORTOGRAM W/LOWER EXTREMITY;  Surgeon: Young Hensen, MD;  [facility] INV[facility];  Service: Cardiovascular;  Laterality: N/A;   AMPUTATION Left 10/31/2022   Procedure: AMPUTATION LEFT FIFTH TOE;  Surgeon: Jennefer Moats, DPM;  Location: [facility] OR;  Service: Podiatry;  Laterality: Left;   AMPUTATION Left 11/15/2022    Procedure: AMPUTATION FITH RAY;  Surgeon: Dot Gazella, DPM;  Location: [facility] OR;  Service: Podiatry;  Laterality: Left;   AMPUTATION TOE Left 10/31/2022   Procedure: IRRIGATION AND DEBRIDEMENTOF LEFT FOOT;  Surgeon: Jennefer Moats, DPM;  Location: [facility] OR;  Service: Podiatry;  Laterality: Left;   BIOPSY  11/05/2022   Procedure: BIOPSY;  Surgeon: Alvis Jourdain, MD;  Location: [facility] ENDOSCOPY;  Service: Gastroenterology;;   CORONARY ARTERY BYPASS GRAFT N/A 06/10/2021   Procedure: CORONARY ARTERY BYPASS GRAFTING (CABG) TIMES FOUR, USING LEFT INTERNAL MAMMARY ARTERY AND RIGHT GREATER SAPHENOUS VEIN HARVESTED ENDOSCOPICALLY;  Surgeon: Shon Downing, MD;  Location: [facility] OR;  Service: Open Heart Surgery;  Laterality: N/A;   ENDOVEIN HARVEST OF GREATER SAPHENOUS VEIN Right 06/10/2021   Procedure: ENDOVEIN HARVEST OF GREATER SAPHENOUS VEIN;  Surgeon: Shon Downing, MD;  [facility] OR;  Service: Open Heart Surgery;  Laterality: Right;   ESOPHAGOGASTRODUODENOSCOPY N/A 11/24/2022   Procedure: ESOPHAGOGASTRODUODENOSCOPY (EGD);  Surgeon: Normie Becton., MD;  Location: [facility] ENDOSCOPY;  Service: Gastroenterology;  Laterality: N/A;   ESOPHAGOGASTRODUODENOSCOPY (EGD) WITH PROPOFOL  N/A 11/05/2022   Procedure: ESOPHAGOGASTRODUODENOSCOPY (EGD) WITH PROPOFOL ;  Surgeon: Alvis Jourdain, MD;  Location: [facility] ENDOSCOPY;  Service: Gastroenterology;  Laterality: N/A;   HEMOSTASIS CLIP PLACEMENT  11/24/2022   Procedure: HEMOSTASIS CLIP PLACEMENT;  Surgeon: Normie Becton., MD;  Location: [facility] ENDOSCOPY;  Service: Gastroenterology;;   HEMOSTASIS CONTROL  11/24/2022   Procedure: HEMOSTASIS CONTROL;  Surgeon: Normie Becton., MD;  [facility] ENDOSCOPY;  Service: Gastroenterology;;   HOT HEMOSTASIS N/A 11/24/2022   Procedure: HOT HEMOSTASIS (ARGON PLASMA COAGULATION/BICAP);  Surgeon: Normie Becton., MD;  Location: [facility] ENDOSCOPY;  Service: Gastroenterology;  Laterality: N/A;   I & D EXTREMITY Left 11/08/2022    Procedure: IRRIGATION AND DEBRIDEMENT OF FOOT AND WASHOUT;  Surgeon: Charity Conch, DPM;  [facility] OR;  Service: Podiatry;  Laterality: Left;   PERIPHERAL VASCULAR BALLOON ANGIOPLASTY  11/03/2022   Procedure: PERIPHERAL VASCULAR BALLOON ANGIOPLASTY;  Surgeon: Young Hensen, MD;  [facility];  Service: Cardiovascular;;   RIGHT/LEFT HEART CATH AND CORONARY ANGIOGRAPHY N/A 06/06/2021   Procedure: RIGHT/LEFT HEART CATH AND CORONARY ANGIOGRAPHY;  Surgeon: Mardell Shade, MD;  [facility];  Service: Cardiovascular;  Laterality: N/A;   TEE WITHOUT CARDIOVERSION N/A 06/10/2021   Procedure: TRANSESOPHAGEAL ECHOCARDIOGRAM (TEE);  Surgeon: Shon Downing, MD;  [facility] OR;  Service: Open Heart Surgery;  Laterality: N/A;    Allergies  Allergen Reactions   Lisinopril     Leg swelling   Statins     Leg swelling    ROS    Physical Exam: There were no vitals filed for this visit.  General: The patient is alert and oriented x3 in no acute distress.  Dermatology: Pedal skin is dry and atrophic.  There is hyperkeratotic callus to lateral base of the left fourth toe at site of prior ulceration.  Upon debridement there is recurrence of the ulceration limited to breakdown of skin with some underyling fibrotic slough. Wound bed appears viable.  Right foot toenails x 5 left foot toenails x 4 are painful, thickened, elongated, dystrophic with yellow discoloration and subungual debris.  Tender on direct dorsal palpation  Vascular: Nonalpable pedal pulses bilaterally. Capillary refill within normal limits.  Absent pedal hair growth.  No appreciable edema.  No erythema or calor.  Neurological: Light touch sensation grossly intact bilateral feet.  Protective sensation decreased  Musculoskeletal Exam: Prior left foot partial fifth ray resection, prior fourth metatarsal resection with preservation of the fourth toe.  Assessment/Plan of Care: 1. 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 Pain due to onychomycosis of toenails of both feet   3. Type 2 diabetes mellitus with diabetic peripheral angiopathy without gangrene, unspecified whether long term insulin  use (HCC)   4. Ulcer of left foot, limited to breakdown of skin (HCC)      No orders of the defined types were placed in this encounter.  FOR HOME USE ONLY DME DIABETIC SHOE  Discussed clinical findings with patient today.  # Pain due to onychomycosis of toenails - Nail plates x 9 were debrided in thickness and length using sterile nail nippers without incident - Mechanical bur used to file the nails down  # Ulceration base of left fourth toe lateral aspect limited to breakdown of skin Sharply debrided with a sterile #15 blade to patient's level of comfort without incident.  Mupirocin  and Band-Aid applied.  Offloading felt padding applied.  Hemostasis achieved with compression.  We discussed preventative and palliative care of these lesions including supportive and accommodative shoegear, padding, prefabricated and custom molded accommodative orthoses, use of a pumice stone and lotions/creams daily. - History of prior amputation PAD.  Will continue close monitoring of this.  Discussed signs and symptoms of developing infection -Follow up in 3-4 weeks for ulcer check  # History of diabetes with peripheral angiopathy # Prior left  foot partial left fifth ray resection, left foot metatarsal resection -Patient educated on diabetes. Discussed proper diabetic foot care and discussed risks and complications of disease. Educated patient in depth on reasons to return to the office immediately should he/she discover anything concerning or new on the feet. All questions answered. Discussed proper shoes as well.  - Prescription sent into [facility] for medically necessary custom extra-depth diabetic shoes with 3 sets of multidensity inserts.  Patient would benefit from  this to offload the recurrent ulceration to the fourth toe. - High risk of further amputation     Vienna Folden L. Lunda Salines, AACFAS [facility]     [address] [place], [place] [address]                Office [phone]  Fax [phone]

## 2023-10-23 ENCOUNTER — Ambulatory Visit: Admitting: Podiatry

## 2023-10-23 DIAGNOSIS — Z89422 Acquired absence of other left toe(s): Secondary | ICD-10-CM

## 2023-10-23 DIAGNOSIS — E1151 Type 2 diabetes mellitus with diabetic peripheral angiopathy without gangrene: Secondary | ICD-10-CM | POA: Diagnosis not present

## 2023-10-23 DIAGNOSIS — L97521 Non-pressure chronic ulcer of other part of left foot limited to breakdown of skin: Secondary | ICD-10-CM | POA: Diagnosis not present

## 2023-10-23 NOTE — Patient Instructions (Addendum)
 If callus forms, apply urea cream to callus to keep area soft and manageable. You can also scrub the callus with white vinegar  Look for urea 40% cream or ointment and apply to the thickened dry skin / calluses. This can be bought over the counter, at a pharmacy or online such as [employer].

## 2023-10-23 NOTE — Progress Notes (Unsigned)
 Chief Complaint  Patient presents with   Diabetic Ulcer    Left foot ulcer check, on the lateral side. It is calloused over but does appear a little smaller. FBS today was 110. A1c was 8.6, three weeks ago. Plavix     HPI: 58 y.o. male presents today for follow-up evaluation of chronic ulcer base of left fourth toe site of prior partial fourth ray amputation, and fifth ray resection. The wound is callused over. Last A1c 8.6.  Past Medical History:  Diagnosis Date   Acute combined systolic and diastolic heart failure (HCC)    Acute systolic heart failure (HCC) 06/06/2021   Anemia    Atherosclerotic heart disease 09/07/2021   CHF (congestive heart failure) (HCC)    Chronic systolic CHF (congestive heart failure) (HCC) 11/23/2022   COPD (chronic obstructive pulmonary disease) (HCC)    Diabetes mellitus, type 2 (HCC)    Gastric ulcer    HLD (hyperlipidemia)    Hypertension    Hypertension associated with diabetes (HCC) 09/29/2022   Hypertensive heart disease with congestive heart failure (HCC) 03/28/2023   Kidney disease, chronic, stage III (GFR 30-59 ml/min) (HCC)    Multiple duodenal ulcers    Orthostatic hypotension    Osteomyelitis (HCC)    Peripheral arterial disease (HCC)    Postural hypotension 12/20/2016   PUD (peptic ulcer disease)    Tobacco abuse     Past Surgical History:  Procedure Laterality Date   ABDOMINAL AORTOGRAM W/LOWER EXTREMITY N/A 11/03/2022   Procedure: ABDOMINAL AORTOGRAM W/LOWER EXTREMITY;  Surgeon: Young Hensen, MD;  [facility];  Service: Cardiovascular;  Laterality: N/A;   AMPUTATION Left 10/31/2022   Procedure: AMPUTATION LEFT FIFTH TOE;  Surgeon: Jennefer Moats, DPM;  [facility] OR;  Service: Podiatry;  Laterality: Left;   AMPUTATION Left 11/15/2022   Procedure: AMPUTATION FITH RAY;  Surgeon: Dot Gazella, DPM;  [facility] OR;  Service: Podiatry;  Laterality: Left;   AMPUTATION TOE Left 10/31/2022   Procedure:  IRRIGATION AND DEBRIDEMENTOF LEFT FOOT;  Surgeon: Jennefer Moats, DPM;  [facility] OR;  Service: Podiatry;  Laterality: Left;   BIOPSY  11/05/2022   Procedure: BIOPSY;  Surgeon: Alvis Jourdain, MD;  Location: [facility] ENDOSCOPY;  Service: Gastroenterology;;   CORONARY ARTERY BYPASS GRAFT N/A 06/10/2021   Procedure: CORONARY ARTERY BYPASS GRAFTING (CABG) TIMES FOUR, USING LEFT INTERNAL MAMMARY ARTERY AND RIGHT GREATER SAPHENOUS VEIN HARVESTED ENDOSCOPICALLY;  Surgeon: Shon Downing, MD;  Location: [facility] OR;  Service: Open Heart Surgery;  Laterality: N/A;   ENDOVEIN HARVEST OF GREATER SAPHENOUS VEIN Right 06/10/2021   Procedure: ENDOVEIN HARVEST OF GREATER SAPHENOUS VEIN;  Surgeon: Shon Downing, MD;  [facility];  Service: Open Heart Surgery;  Laterality: Right;   ESOPHAGOGASTRODUODENOSCOPY N/A 11/24/2022   Procedure: ESOPHAGOGASTRODUODENOSCOPY (EGD);  Surgeon: Normie Becton., MD;  Location: [facility] ENDOSCOPY;  Service: Gastroenterology;  Laterality: N/A;   ESOPHAGOGASTRODUODENOSCOPY (EGD) WITH PROPOFOL  N/A 11/05/2022   Procedure: ESOPHAGOGASTRODUODENOSCOPY (EGD) WITH PROPOFOL ;  Surgeon: Alvis Jourdain, MD;  Location: [facility] ENDOSCOPY;  Service: Gastroenterology;  Laterality: N/A;   HEMOSTASIS CLIP PLACEMENT  11/24/2022   Procedure: HEMOSTASIS CLIP PLACEMENT;  Surgeon: Normie Becton., MD;  Location: [facility] ENDOSCOPY;  Service: Gastroenterology;;   HEMOSTASIS CONTROL  11/24/2022   Procedure: HEMOSTASIS CONTROL;  Surgeon: Normie Becton., MD;  Location: [facility] ENDOSCOPY;  Service: Gastroenterology;;   HOT HEMOSTASIS N/A 11/24/2022   Procedure: HOT HEMOSTASIS (ARGON PLASMA COAGULATION/BICAP);  Surgeon: Normie Becton., MD;  [facility] ENDOSCOPY;  Service: Gastroenterology;  Laterality: N/A;   I & D EXTREMITY Left 11/08/2022   Procedure: IRRIGATION AND DEBRIDEMENT OF FOOT AND WASHOUT;  Surgeon: Charity Conch, DPM;  [facility] OR;  Service: Podiatry;  Laterality: Left;   PERIPHERAL VASCULAR  BALLOON ANGIOPLASTY  11/03/2022   Procedure: PERIPHERAL VASCULAR BALLOON ANGIOPLASTY;  Surgeon: Young Hensen, MD;  [facility];  Service: Cardiovascular;;   RIGHT/LEFT HEART CATH AND CORONARY ANGIOGRAPHY N/A 06/06/2021   Procedure: RIGHT/LEFT HEART CATH AND CORONARY ANGIOGRAPHY;  Surgeon: Mardell Shade, MD;  [facility];  Service: Cardiovascular;  Laterality: N/A;   TEE WITHOUT CARDIOVERSION N/A 06/10/2021   Procedure: TRANSESOPHAGEAL ECHOCARDIOGRAM (TEE);  Surgeon: Shon Downing, MD;  Location: [facility] OR;  Service: Open Heart Surgery;  Laterality: N/A;    Allergies  Allergen Reactions   Lisinopril     Leg swelling   Statins     Leg swelling    ROS    Physical Exam: There were no vitals filed for this visit.  General: The patient is alert and oriented x3 in no acute distress.  Dermatology: Pedal skin is dry and atrophic.  There is hyperkeratotic callus to lateral base of the left fourth toe at site of prior ulceration.  Upon debridement there is recurrence of the ulceration limited to breakdown of skin with some underyling fibrotic slough. Dermal tissue involvement. Wound bed appears viable.  Measures 0.4 x 0.3 x 0.2 cm.  No surrounding erythema.  Scant drainage.  Vascular: Nonalpable pedal pulses bilaterally. Capillary refill within normal limits.  Absent pedal hair growth.  No appreciable edema.  No erythema or calor.  Neurological: Light touch sensation grossly intact bilateral feet.  Protective sensation decreased  Musculoskeletal Exam: Prior left foot partial fifth ray resection, prior fourth metatarsal resection with preservation of the fourth toe.  Assessment/Plan of Care: 1. Type 2 diabetes mellitus with diabetic peripheral angiopathy without gangrene, unspecified whether long term insulin  use (HCC)   2. 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 Ulcer of left foot, limited to breakdown of skin (HCC)       No orders of the defined types were placed in this encounter.  None  Discussed clinical findings with patient today.  # Ulceration base of left fourth toe lateral aspect limited to breakdown of skin Sharply debrided with a sterile #15 blade to patient's level of comfort without incident.  Wound cleansed with wound cleanser and Prisma collagen and Band-Aid applied.  Offloading felt padding applied.  Hemostasis achieved with compression.  We discussed preventative and palliative care of these lesions including supportive and accommodative shoegear, padding, prefabricated and custom molded accommodative orthoses, use of a pumice stone and lotions/creams daily. - History of prior amputation PAD.  Will continue close monitoring of this.  Discussed signs and symptoms of developing infection Dispensed tailor's bunionette sleeves and offloading felt padding applied. -Follow up in 3 weeks for ulcer check      Peony Barner L. Lunda Salines, AACFAS [facility]     [address], [place] [address]                Office [phone]  Fax [phone]

## 2023-10-26 ENCOUNTER — Encounter: Payer: Self-pay | Admitting: Podiatry

## 2023-11-12 ENCOUNTER — Ambulatory Visit: Payer: Medicaid Other | Admitting: Cardiology

## 2023-11-12 NOTE — Progress Notes (Unsigned)
 Cardiology Office Note:  .   Date:  11/13/2023  ID:  Kenneth Bailey, DOB 12/16/1965, MRN [id number] PCP: Lonna Leonor HERO, NP  [facility] Providers Cardiologist:  Redell Leiter, MD    History of Present Illness: .    Kenneth Bailey is a 58 y.o. male with a past medical history of systolic heart failure, coronary artery disease s/p CABG x 4 in 2023, COPD, PUD, PAD, DM2, dyslipidemia, upper GI bleed, nicotine  dependence.  2023 CABG x 4 06/06/2021 left heart cath severe three-vessel CAD 10/11/2021 renal artery ultrasound without evidence of stenosis.  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 09/12/2022 echocardiogram EF 45 to 50%, grade 1 DD 10/30/2022 ABIs right is abnormal, left indicates moderate arterial disease 04/05/2023 Lexiscan , mild ischemia involving basal portion of the inferior wall, intermediate risk secondary to EF >> recommendations for repeat echo 05/08/2023 echo EF 55 to 60%, grade 1 DD, no valvular abnormalities  Evaluated by Dr. Leiter on 01/12/2023 after being evaluated in the emergency department the previous day for edema, hemoglobin noted to be 8.7, WBCs 13.6, creatinine 1.79.  He was also started on Chantix  to help with smoking cessation.  He was evaluated in the emergency department on 01/17/2023 for orthostatic hypotension, his Lasix  had been recently changed to twice daily felt this was the culprit for his hypotension.  He was then admitted to [facility] on 01/21/2023 with AKI, with instructions to stop his Lopid .  Evaluated by Dr. Leiter on 04/02/2023, he was having some episodes of chest pain however it is hard to decipher if it was related to GI/history of PUD. Lexiscan  was arranged which revealed no ischemia by decreased EF >> echo revealed EF 55-60%.    Most recently was evaluated by myself on 05/14/2023, no complaints for cardiac perspective, he had stopped most of his medications including Farxiga , Plavix , Repatha ,Lopid , Entresto ,  spironolactone , Chantix  and stated he was feeling much better, he was amenable to restarting his Plavix  so we restarted this.  He followed up with Rosaline Bane NP for management of his dyslipidemia, apparently at this time he had restarted his Repatha , no changes were made to his plan of care and he was advised to keep his follow-up appointment with general cardiology.  He presents today for follow-up, apparently he was in the emergency department approximately a month ago for dehydration episodes of chest pain, EKG was unrevealing, troponins were negative and he was advised to follow-up to see if he needed a stress evaluation.  He does not have any formal complaints today, recently undergone an amputation of his small toe and using a cane for ambulation. He denies chest pain, palpitations, dyspnea, pnd, orthopnea, n, v, dizziness, syncope, edema, weight gain, or early satiety.   ROS: Review of Systems  Musculoskeletal:  Positive for joint pain.  All other systems reviewed and are negative.    Studies Reviewed: .        Cardiac Studies & Procedures   ______________________________________________________________________________________________ CARDIAC CATHETERIZATION  CARDIAC CATHETERIZATION 06/06/2021  Conclusion   Ost LAD to Prox LAD lesion is 90% stenosed.   Ramus lesion is 90% stenosed.   Prox RCA lesion is 95% stenosed.   Prox RCA to Mid RCA lesion is 80% stenosed.   Dist RCA lesion is 50% stenosed.   RPDA lesion is 90% stenosed.   Mid LM to Dist LM lesion is 50% stenosed.   Ost Cx to Prox Cx lesion is 40% stenosed.   1st Mrg lesion  is 99% stenosed.   Mid Cx lesion is 50% stenosed.   2nd Diag lesion is 90% stenosed.   Mid LAD lesion is 70% stenosed.   Dist LAD lesion is 50% stenosed.   The left ventricular ejection fraction is 25-35% by visual estimate.  Findings:  Ao = 99/62 (84) LV = 107/11 RA =  5 RV = 30/6 PA = 29/9 (19) PCW = 12 Fick cardiac output/index =  4.7/2.5 PVR = 1.5 WU FA sat = 97% PA sat = 69%, 71% SVC sat = 69%  Assessment: Severe 3v CAD Ischemic CM EF ~25-30% Well compensated filling pressures  Plan/Discussion:  Will need CABG evaluation.  Toribio Fuel, MD 1:11 PM  Findings Coronary Findings Diagnostic  Dominance: Right  Left Main Mid LM to Dist LM lesion is 50% stenosed.  Left Anterior Descending Ost LAD to Prox LAD lesion is 90% stenosed. Mid LAD lesion is 70% stenosed. Dist LAD lesion is 50% stenosed.  Second Diagonal Branch 2nd Diag lesion is 90% stenosed.  Ramus Intermedius Ramus lesion is 90% stenosed.  Left Circumflex Ost Cx to Prox Cx lesion is 40% stenosed. Mid Cx lesion is 50% stenosed.  First Obtuse Marginal Branch 1st Mrg lesion is 99% stenosed.  Right Coronary Artery Prox RCA lesion is 95% stenosed. Prox RCA to Mid RCA lesion is 80% stenosed. Dist RCA lesion is 50% stenosed.  Right Posterior Descending Artery RPDA lesion is 90% stenosed.  Intervention  No interventions have been documented.   STRESS TESTS  MYOCARDIAL PERFUSION IMAGING 04/05/2023  Narrative   Findings are consistent with mild ischemia involving basal portion of the inferior wall. The study is intermediate risk secondary to diminished EF.   No ST deviation was noted.   Left ventricular function is abnormal. Global function is moderately reduced. Nuclear stress EF: 37%. The left ventricular ejection fraction is moderately decreased (30-44%). End diastolic cavity size is normal.   Prior study not available for comparison.   ECHOCARDIOGRAM  ECHOCARDIOGRAM COMPLETE 05/08/2023  Narrative ECHOCARDIOGRAM REPORT    Patient Name:   Kenneth Bailey [facility] Date of Exam: 05/08/2023 Medical Rec #:  [id number]           Height:       66.0 in Accession #:    [id number]          Weight:       174.0 lb Date of Birth:  1965/07/09            BSA:          1.885 m Patient Age:    57 years            BP:           120/78  mmHg Patient Gender: M                   HR:           96 bpm. Exam Location:  [facility]  Procedure: 2D Echo, Cardiac Doppler, Color Doppler and Strain Analysis  Indications:    CAD in native artery [I25.10 (ICD-10-CM)]; Ischemic cardiomyopathy [I25.5 (ICD-10-CM)]  History:        Patient has prior history of Echocardiogram examinations, most recent 09/12/2022. Cardiomyopathy and CHF, CAD, Prior CABG; Signs/Symptoms:Chest Pain.  Sonographer:    Charlie Jointer RDCS Referring Phys: [id number] BRIAN J MUNLEY  IMPRESSIONS   1. Left ventricular ejection fraction, by estimation, is 55 to 60%. The left ventricle has normal function. The left ventricle has no  regional wall motion abnormalities. Left ventricular diastolic parameters are consistent with Grade I diastolic dysfunction (impaired relaxation). GLS-12.9% 2. Right ventricular systolic function is normal. The right ventricular size is normal. 3. The mitral valve is normal in structure. No evidence of mitral valve regurgitation. No evidence of mitral stenosis. 4. The aortic valve is normal in structure. Aortic valve regurgitation is not visualized. No aortic stenosis is present. 5. The inferior vena cava is normal in size with greater than 50% respiratory variability, suggesting right atrial pressure of 3 mmHg.  FINDINGS Left Ventricle: Left ventricular ejection fraction, by estimation, is 50 to 55%. The left ventricle has low normal function. The left ventricle has no regional wall motion abnormalities. The left ventricular internal cavity size was normal in size. There is no left ventricular hypertrophy. Left ventricular diastolic parameters are consistent with Grade I diastolic dysfunction (impaired relaxation).  Right Ventricle: The right ventricular size is normal. No increase in right ventricular wall thickness. Right ventricular systolic function is normal.  Left Atrium: Left atrial size was normal in size.  Right Atrium: Right  atrial size was normal in size.  Pericardium: There is no evidence of pericardial effusion.  Mitral Valve: The mitral valve is normal in structure. No evidence of mitral valve regurgitation. No evidence of mitral valve stenosis.  Tricuspid Valve: The tricuspid valve is normal in structure. Tricuspid valve regurgitation is not demonstrated. No evidence of tricuspid stenosis.  Aortic Valve: The aortic valve is normal in structure. Aortic valve regurgitation is not visualized. No aortic stenosis is present. Aortic valve mean gradient measures 5.5 mmHg. Aortic valve peak gradient measures 10.2 mmHg. Aortic valve area, by VTI measures 1.66 cm.  Pulmonic Valve: The pulmonic valve was normal in structure. Pulmonic valve regurgitation is not visualized. No evidence of pulmonic stenosis.  Aorta: The aortic root is normal in size and structure.  Venous: The inferior vena cava is normal in size with greater than 50% respiratory variability, suggesting right atrial pressure of 3 mmHg.  IAS/Shunts: No atrial level shunt detected by color flow Doppler.   LEFT VENTRICLE PLAX 2D LVIDd:         4.90 cm   Diastology LVIDs:         4.10 cm   LV e' medial:    6.85 cm/s LV PW:         1.10 cm   LV E/e' medial:  10.0 LV IVS:        0.80 cm   LV e' lateral:   7.18 cm/s LVOT diam:     1.90 cm   LV E/e' lateral: 9.5 LV SV:         45 LV SV Index:   24 LVOT Area:     2.84 cm   RIGHT VENTRICLE            IVC RV Basal diam:  2.80 cm    IVC diam: 1.35 cm RV Mid diam:    2.60 cm RV S prime:     9.68 cm/s TAPSE (M-mode): 1.7 cm  LEFT ATRIUM             Index        RIGHT ATRIUM           Index LA diam:        2.80 cm 1.49 cm/m   RA Area:     13.60 cm LA Vol (A2C):   37.2 ml 19.74 ml/m  RA Volume:   29.90 ml  15.86  ml/m LA Vol (A4C):   57.1 ml 30.29 ml/m LA Biplane Vol: 46.3 ml 24.56 ml/m AORTIC VALVE AV Area (Vmax):    1.60 cm AV Area (Vmean):   1.61 cm AV Area (VTI):     1.66 cm AV Vmax:            160.00 cm/s AV Vmean:          108.500 cm/s AV VTI:            0.272 m AV Peak Grad:      10.2 mmHg AV Mean Grad:      5.5 mmHg LVOT Vmax:         90.57 cm/s LVOT Vmean:        61.433 cm/s LVOT VTI:          0.159 m LVOT/AV VTI ratio: 0.59  AORTA Ao Root diam: 3.20 cm Ao Asc diam:  3.20 cm Ao Desc diam: 1.80 cm  MITRAL VALVE MV Area (PHT): 5.75 cm     SHUNTS MV Decel Time: 132 msec     Systemic VTI:  0.16 m MV E velocity: 68.30 cm/s   Systemic Diam: 1.90 cm MV A velocity: 107.50 cm/s MV E/A ratio:  0.64  Lovene Maret Crape MD Electronically signed by Orli Degrave Crape MD Signature Date/Time: 05/08/2023/3:55:15 PM    Final   TEE  ECHO INTRAOPERATIVE TEE 06/10/2021  Narrative *INTRAOPERATIVE TRANSESOPHAGEAL REPORT *    Patient Name:   Kenneth Bailey [facility] Date of Exam: 06/10/2021 Medical Rec #:  [id number]           Height:       68.0 in Accession #:    [id number]          Weight:       166.0 lb Date of Birth:  February 13, 1966            BSA:          1.89 m Patient Age:    55 years            BP:           143/88 mmHg Patient Gender: M                   HR:           72 bpm. Exam Location:  Anesthesiology  Transesophogeal exam was perform intraoperatively during surgical procedure. Patient was closely monitored under general anesthesia during the entirety of examination.  Indications:     Coronary Artery Disease Sonographer:     Lauraine Pilot RDCS Performing Phys: [id number]TRIGT Diagnosing Phys: Norleen Pope MD  Complications: No known complications during this procedure. POST-OP IMPRESSIONS _ Left Ventricle: The left ventricle is unchanged from pre-bypass. _ Right Ventricle: The right ventricle appears unchanged from pre-bypass. _ Aorta: The aorta appears unchanged from pre-bypass. _ Left Atrial Appendage: The left atrial appendage appears unchanged from pre-bypass. _ Aortic Valve: The aortic valve appears unchanged from pre-bypass. _ Mitral Valve: The  mitral valve appears unchanged from pre-bypass. _ Tricuspid Valve: The tricuspid valve appears unchanged from pre-bypass. _ Pulmonic Valve: The pulmonic valve appears unchanged from pre-bypass.  PRE-OP FINDINGS Left Ventricle: The left ventricle has severely reduced systolic function, with an ejection fraction of 20-30%. The cavity size was moderately dilated.   Right Ventricle: The right ventricle has normal systolic function. The cavity was normal. There is no increase in right ventricular wall thickness.  Left Atrium: Left atrial size was normal in  size. No left atrial/left atrial appendage thrombus was detected.  Right Atrium: Right atrial size was normal in size. Moble, filamentous structure noted arrising from IVC and into Right atrium. Discussed wtih Dr. Obadiah.  Interatrial Septum: Evidence of atrial level shunting detected by color flow Doppler. A small patent foramen ovale is detected with predominantly left to right shunting across the atrial septum.  Pericardium: There is no evidence of pericardial effusion.  Mitral Valve: The mitral valve is normal in structure. Mitral valve regurgitation is trivial by color flow Doppler. There is no evidence of mitral valve vegetation.  Tricuspid Valve: The tricuspid valve was normal in structure. Tricuspid valve regurgitation is trivial by color flow Doppler.  Aortic Valve: The aortic valve is tricuspid Aortic valve regurgitation was not visualized by color flow Doppler. There is no stenosis of the aortic valve. There is no evidence of aortic valve vegetation.   Pulmonic Valve: The pulmonic valve was normal in structure. Pulmonic valve regurgitation is trivial by color flow Doppler.   Aorta: There is evidence of plaque in the descending aorta; Grade II, measuring 2-7mm in size.  +-------------+--------++ AORTIC VALVE          +-------------+--------++ AV Mean Grad:6.0 mmHg +-------------+--------++   Norleen Pope  MD Electronically signed by Norleen Pope MD Signature Date/Time: 06/10/2021/3:40:03 PM    Final        ______________________________________________________________________________________________      Risk Assessment/Calculations:     HYPERTENSION CONTROL Vitals:   11/13/23 0757 11/13/23 1207  BP: (!) 140/80 (!) 140/80    The patient's blood pressure is elevated above target today.  In order to address the patient's elevated BP: Blood pressure will be monitored at home to determine if medication changes need to be made.          Physical Exam:   VS:  BP (!) 140/80 Comment: ** did not sleep last night and very tired, not willing to check BP at home, typically well controlled.  Pulse 80   Ht 5' 8 (1.727 m)   Wt 177 lb (80.3 kg)   SpO2 93%   BMI 26.91 kg/m    Wt Readings from Last 3 Encounters:  11/13/23 177 lb (80.3 kg)  08/29/23 175 lb 9.6 oz (79.7 kg)  08/27/23 178 lb (80.7 kg)    GEN: Well nourished, well developed in no acute distress NECK: No JVD; No carotid bruits CARDIAC: RRR, no murmurs, rubs, gallops RESPIRATORY:  Clear to auscultation without rales, wheezing or rhonchi  ABDOMEN: Soft, non-tender, non-distended EXTREMITIES:  No edema; No deformity   ASSESSMENT AND PLAN: .   Coronary artery disease- s/p CABG x 4 in 2023. Stable with no anginal symptoms.  Evaluated in the emergency department for chest pain of uncertain origin, was told he was dehydrated and to follow-up with cardiology to see if he needed ischemic evaluation.  We discussed proceeding with a stress evaluation however he declines, does not feel is necessary and feels that his issue was related to dehydration.  Continue Plavix  75 mg daily, Repatha , Vascepa .  He is not willing to take another medications at this time.   Ischemic cardiomyopathy-most recent echo revealed normalization of his EF.  NYHA class I, euvolemic.  He stopped most of his medications on his own volition and is not  agreeable to restarting them at this time.  Previously was on Farxiga , Entresto , spironolactone --which she is all stopped and not agreeable to restarting.  Continue Lasix  as needed.  PAD-follows with vascular, has repeat  ABIs and office visit with them on 11/2023.  Currently on Plavix  and Repatha .  Tobacco abuse-previously on Chantix  however is no longer taking this, continues to smoke but is cut back from 3 packs/day to half pack per day.  Not interested in cessation at this time and he is aware the deleterious side effects of smoking.  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 He is very dependent on caffeine as he used this to stop his ETOH use.   Dyslipidemia -followed up with the lipid clinic, currently on Vascepa , Repatha , is not interested in taking any other medications at this time.   DM2 with insulin  dependency - A1C > 10%, uncontrolled with hypertriglyceridemia. Heart healthy diet and regular cardiovascular exercise encouraged.         Dispo: 6 months.   Signed, Delon JAYSON Hoover, NP

## 2023-11-13 ENCOUNTER — Ambulatory Visit: Attending: Cardiology | Admitting: Cardiology

## 2023-11-13 ENCOUNTER — Ambulatory Visit: Admitting: Podiatry

## 2023-11-13 ENCOUNTER — Encounter: Payer: Self-pay | Admitting: Podiatry

## 2023-11-13 ENCOUNTER — Encounter: Payer: Self-pay | Admitting: Cardiology

## 2023-11-13 VITALS — BP 140/80 | HR 80 | Ht 68.0 in | Wt 177.0 lb

## 2023-11-13 DIAGNOSIS — I739 Peripheral vascular disease, unspecified: Secondary | ICD-10-CM | POA: Diagnosis not present

## 2023-11-13 DIAGNOSIS — E119 Type 2 diabetes mellitus without complications: Secondary | ICD-10-CM | POA: Diagnosis present

## 2023-11-13 DIAGNOSIS — Z794 Long term (current) use of insulin: Secondary | ICD-10-CM | POA: Diagnosis present

## 2023-11-13 DIAGNOSIS — I2510 Atherosclerotic heart disease of native coronary artery without angina pectoris: Secondary | ICD-10-CM | POA: Diagnosis not present

## 2023-11-13 DIAGNOSIS — Z89422 Acquired absence of other left toe(s): Secondary | ICD-10-CM | POA: Diagnosis not present

## 2023-11-13 DIAGNOSIS — E1151 Type 2 diabetes mellitus with diabetic peripheral angiopathy without gangrene: Secondary | ICD-10-CM | POA: Diagnosis not present

## 2023-11-13 DIAGNOSIS — Z951 Presence of aortocoronary bypass graft: Secondary | ICD-10-CM | POA: Diagnosis present

## 2023-11-13 DIAGNOSIS — I5032 Chronic diastolic (congestive) heart failure: Secondary | ICD-10-CM | POA: Diagnosis present

## 2023-11-13 DIAGNOSIS — I5020 Unspecified systolic (congestive) heart failure: Secondary | ICD-10-CM

## 2023-11-13 DIAGNOSIS — Z720 Tobacco use: Secondary | ICD-10-CM | POA: Diagnosis present

## 2023-11-13 DIAGNOSIS — L97521 Non-pressure chronic ulcer of other part of left foot limited to breakdown of skin: Secondary | ICD-10-CM | POA: Diagnosis not present

## 2023-11-13 DIAGNOSIS — E782 Mixed hyperlipidemia: Secondary | ICD-10-CM

## 2023-11-13 NOTE — Patient Instructions (Signed)

## 2023-11-13 NOTE — Progress Notes (Signed)
 Chief Complaint  Patient presents with   Diabetic Ulcer    Left lateral ulcer. Looks great today. A1c 8.6 in May. Plavix .    HPI: 58 y.o. male presents today for follow-up evaluation of chronic ulcer base of left fourth toe site of prior partial fourth ray amputation, and fifth ray resection.  He denies any pain to the area and states that he has been fitted for diabetic shoes.. Last A1c 8.6.  Past Medical History:  Diagnosis Date   Acute combined systolic and diastolic heart failure (HCC)    Acute systolic heart failure (HCC) 06/06/2021   Anemia    Atherosclerotic heart disease 09/07/2021   CHF (congestive heart failure) (HCC)    Chronic systolic CHF (congestive heart failure) (HCC) 11/23/2022   COPD (chronic obstructive pulmonary disease) (HCC)    Diabetes mellitus, type 2 (HCC)    Gastric ulcer    HLD (hyperlipidemia)    Hypertension    Hypertension associated with diabetes (HCC) 09/29/2022   Hypertensive heart disease with congestive heart failure (HCC) 03/28/2023   Kidney disease, chronic, stage III (GFR 30-59 ml/min) (HCC)    Multiple duodenal ulcers    Orthostatic hypotension    Osteomyelitis (HCC)    Peripheral arterial disease (HCC)    Postural hypotension 12/20/2016   PUD (peptic ulcer disease)    Tobacco abuse     Past Surgical History:  Procedure Laterality Date   ABDOMINAL AORTOGRAM W/LOWER EXTREMITY N/A 11/03/2022   Procedure: ABDOMINAL AORTOGRAM W/LOWER EXTREMITY;  Surgeon: Gretta Lonni PARAS, MD;  Location: [facility];  Service: Cardiovascular;  Laterality: N/A;   AMPUTATION Left 10/31/2022   Procedure: AMPUTATION LEFT FIFTH TOE;  Surgeon: Joya Stabs, DPM;  Location: [facility] OR;  Service: Podiatry;  Laterality: Left;   AMPUTATION Left 11/15/2022   Procedure: AMPUTATION FITH RAY;  Surgeon: Janit Thresa HERO, DPM;  Location: [facility] OR;  Service: Podiatry;  Laterality: Left;   AMPUTATION TOE Left 10/31/2022   Procedure: IRRIGATION AND DEBRIDEMENTOF LEFT  FOOT;  Surgeon: Joya Stabs, DPM;  Location: [facility] OR;  Service: Podiatry;  Laterality: Left;   BIOPSY  11/05/2022   Procedure: BIOPSY;  Surgeon: Rollin Dover, MD;  Location: [facility] ENDOSCOPY;  Service: Gastroenterology;;   CORONARY ARTERY BYPASS GRAFT N/A 06/10/2021   Procedure: CORONARY ARTERY BYPASS GRAFTING (CABG) TIMES FOUR, USING LEFT INTERNAL MAMMARY ARTERY AND RIGHT GREATER SAPHENOUS VEIN HARVESTED ENDOSCOPICALLY;  Surgeon: Obadiah Coy, MD;  Location: [facility] OR;  Service: Open Heart Surgery;  Laterality: N/A;   ENDOVEIN HARVEST OF GREATER SAPHENOUS VEIN Right 06/10/2021   Procedure: ENDOVEIN HARVEST OF GREATER SAPHENOUS VEIN;  Surgeon: Obadiah Coy, MD;  Location: [facility] OR;  Service: Open Heart Surgery;  Laterality: Right;   ESOPHAGOGASTRODUODENOSCOPY N/A 11/24/2022   Procedure: ESOPHAGOGASTRODUODENOSCOPY (EGD);  Surgeon: Wilhelmenia Aloha Raddle., MD;  Location: [facility] ENDOSCOPY;  Service: Gastroenterology;  Laterality: N/A;   ESOPHAGOGASTRODUODENOSCOPY (EGD) WITH PROPOFOL  N/A 11/05/2022   Procedure: ESOPHAGOGASTRODUODENOSCOPY (EGD) WITH PROPOFOL ;  Surgeon: Rollin Dover, MD;  Location: [facility] ENDOSCOPY;  Service: Gastroenterology;  Laterality: N/A;   HEMOSTASIS CLIP PLACEMENT  11/24/2022   Procedure: HEMOSTASIS CLIP PLACEMENT;  Surgeon: Wilhelmenia Aloha Raddle., MD;  Location: [facility] ENDOSCOPY;  Service: Gastroenterology;;   HEMOSTASIS CONTROL  11/24/2022   Procedure: HEMOSTASIS CONTROL;  Surgeon: Wilhelmenia Aloha Raddle., MD;  Location: [facility] ENDOSCOPY;  Service: Gastroenterology;;   HOT HEMOSTASIS N/A 11/24/2022   Procedure: HOT HEMOSTASIS (ARGON PLASMA COAGULATION/BICAP);  Surgeon: Wilhelmenia Aloha Raddle., MD;  Location: [facility] ENDOSCOPY;  Service:  Gastroenterology;  Laterality: N/A;   I & D EXTREMITY Left 11/08/2022   Procedure: IRRIGATION AND DEBRIDEMENT OF FOOT AND WASHOUT;  Surgeon: Gershon Donnice SAUNDERS, DPM;  Location: [facility] OR;  Service: Podiatry;  Laterality: Left;   PERIPHERAL VASCULAR BALLOON ANGIOPLASTY  11/03/2022    Procedure: PERIPHERAL VASCULAR BALLOON ANGIOPLASTY;  Surgeon: Gretta Lonni PARAS, MD;  Location: [facility];  Service: Cardiovascular;;   RIGHT/LEFT HEART CATH AND CORONARY ANGIOGRAPHY N/A 06/06/2021   Procedure: RIGHT/LEFT HEART CATH AND CORONARY ANGIOGRAPHY;  Surgeon: Cherrie Toribio SAUNDERS, MD;  Location: [facility];  Service: Cardiovascular;  Laterality: N/A;   TEE WITHOUT CARDIOVERSION N/A 06/10/2021   Procedure: TRANSESOPHAGEAL ECHOCARDIOGRAM (TEE);  Surgeon: Obadiah Coy, MD;  Location: [facility] OR;  Service: Open Heart Surgery;  Laterality: N/A;    Allergies  Allergen Reactions   Lisinopril     Leg swelling   Statins     Leg swelling    ROS    Physical Exam: There were no vitals filed for this visit.  General: The patient is alert and oriented x3 in no acute distress.  Dermatology: Pedal skin is dry and atrophic.  Site of ulceration has callused over today.  This area is 0.3 cm in diameter.  This is relatively mild and is firmly adhered.  Vascular: Nonalpable pedal pulses bilaterally. Capillary refill within normal limits.  Absent pedal hair growth.  No appreciable edema.  No erythema or calor.  Neurological: Light touch sensation grossly intact bilateral feet.  Protective sensation decreased  Musculoskeletal Exam: Prior left foot partial fifth ray resection, prior fourth metatarsal resection with preservation of the fourth toe.  Assessment/Plan of Care: 1. Type 2 diabetes mellitus with diabetic peripheral angiopathy without gangrene, unspecified whether long term insulin  use (HCC)   2. 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 Ulcer of left foot, limited to breakdown of skin (HCC)       No orders of the defined types were placed in this encounter.  None  Discussed clinical findings with patient today.  Findings reviewed with the patient. Chronic ulceration -Currently closed with mild, firmly adhered callus.  We will try  leaving this alone and continuing to pad over it over the next couple weeks - Bandage and antibiotic ointment applied today. - Strict return precautions discussed with patient.  He is to contact office if he notices increased pain, redness, swelling, drainage as this could represent reulceration and infection.  Patient expressed good understanding  -Reappoint in 6 weeks for diabetic footcare and will recheck ulceration site.      Sevyn Markham L. Lamount MAUL, AACFAS [facility]     [address] [place], [place] [address]                Office [phone]  Fax [phone]

## 2023-12-04 ENCOUNTER — Encounter (HOSPITAL_COMMUNITY): Payer: Medicaid Other

## 2023-12-04 ENCOUNTER — Ambulatory Visit: Payer: Medicaid Other

## 2023-12-08 ENCOUNTER — Observation Stay (HOSPITAL_COMMUNITY)

## 2023-12-08 ENCOUNTER — Observation Stay (HOSPITAL_BASED_OUTPATIENT_CLINIC_OR_DEPARTMENT_OTHER)

## 2023-12-08 ENCOUNTER — Emergency Department (HOSPITAL_COMMUNITY)

## 2023-12-08 ENCOUNTER — Other Ambulatory Visit: Payer: Self-pay

## 2023-12-08 ENCOUNTER — Observation Stay (HOSPITAL_COMMUNITY)
Admission: EM | Admit: 2023-12-08 | Discharge: 2023-12-10 | Disposition: A | Discharge status: Home / Self Care | Attending: Internal Medicine | Admitting: Internal Medicine

## 2023-12-08 ENCOUNTER — Encounter (HOSPITAL_COMMUNITY): Payer: Self-pay

## 2023-12-08 DIAGNOSIS — Z7902 Long term (current) use of antithrombotics/antiplatelets: Secondary | ICD-10-CM | POA: Diagnosis not present

## 2023-12-08 DIAGNOSIS — Z951 Presence of aortocoronary bypass graft: Secondary | ICD-10-CM | POA: Diagnosis not present

## 2023-12-08 DIAGNOSIS — R269 Unspecified abnormalities of gait and mobility: Secondary | ICD-10-CM

## 2023-12-08 DIAGNOSIS — R4182 Altered mental status, unspecified: Principal | ICD-10-CM | POA: Insufficient documentation

## 2023-12-08 DIAGNOSIS — I5020 Unspecified systolic (congestive) heart failure: Secondary | ICD-10-CM

## 2023-12-08 DIAGNOSIS — I6503 Occlusion and stenosis of bilateral vertebral arteries: Secondary | ICD-10-CM | POA: Diagnosis not present

## 2023-12-08 DIAGNOSIS — I951 Orthostatic hypotension: Secondary | ICD-10-CM | POA: Diagnosis present

## 2023-12-08 DIAGNOSIS — I82502 Chronic embolism and thrombosis of unspecified deep veins of left lower extremity: Secondary | ICD-10-CM | POA: Diagnosis not present

## 2023-12-08 DIAGNOSIS — R531 Weakness: Secondary | ICD-10-CM

## 2023-12-08 DIAGNOSIS — R29810 Facial weakness: Secondary | ICD-10-CM | POA: Diagnosis present

## 2023-12-08 DIAGNOSIS — Z7982 Long term (current) use of aspirin: Secondary | ICD-10-CM | POA: Diagnosis not present

## 2023-12-08 DIAGNOSIS — G459 Transient cerebral ischemic attack, unspecified: Secondary | ICD-10-CM | POA: Diagnosis not present

## 2023-12-08 DIAGNOSIS — I130 Hypertensive heart and chronic kidney disease with heart failure and stage 1 through stage 4 chronic kidney disease, or unspecified chronic kidney disease: Secondary | ICD-10-CM | POA: Diagnosis not present

## 2023-12-08 DIAGNOSIS — I2510 Atherosclerotic heart disease of native coronary artery without angina pectoris: Secondary | ICD-10-CM | POA: Insufficient documentation

## 2023-12-08 DIAGNOSIS — N189 Chronic kidney disease, unspecified: Secondary | ICD-10-CM | POA: Diagnosis present

## 2023-12-08 DIAGNOSIS — E1122 Type 2 diabetes mellitus with diabetic chronic kidney disease: Secondary | ICD-10-CM | POA: Diagnosis not present

## 2023-12-08 DIAGNOSIS — F1290 Cannabis use, unspecified, uncomplicated: Secondary | ICD-10-CM

## 2023-12-08 DIAGNOSIS — R569 Unspecified convulsions: Secondary | ICD-10-CM | POA: Diagnosis not present

## 2023-12-08 DIAGNOSIS — I10 Essential (primary) hypertension: Secondary | ICD-10-CM

## 2023-12-08 DIAGNOSIS — Z720 Tobacco use: Secondary | ICD-10-CM | POA: Diagnosis present

## 2023-12-08 DIAGNOSIS — R404 Transient alteration of awareness: Secondary | ICD-10-CM | POA: Diagnosis not present

## 2023-12-08 DIAGNOSIS — E119 Type 2 diabetes mellitus without complications: Secondary | ICD-10-CM

## 2023-12-08 DIAGNOSIS — Z7984 Long term (current) use of oral hypoglycemic drugs: Secondary | ICD-10-CM | POA: Diagnosis not present

## 2023-12-08 DIAGNOSIS — Z79899 Other long term (current) drug therapy: Secondary | ICD-10-CM | POA: Diagnosis not present

## 2023-12-08 DIAGNOSIS — J449 Chronic obstructive pulmonary disease, unspecified: Secondary | ICD-10-CM | POA: Diagnosis not present

## 2023-12-08 DIAGNOSIS — I5022 Chronic systolic (congestive) heart failure: Principal | ICD-10-CM | POA: Diagnosis present

## 2023-12-08 LAB — I-STAT VENOUS BLOOD GAS, ED
Acid-Base Excess: 7 mmol/L — ABNORMAL HIGH (ref 0.0–2.0)
Acid-Base Excess: 6 mmol/L — ABNORMAL HIGH (ref 0.0–2.0)
Bicarbonate: 32.3 mmol/L — ABNORMAL HIGH (ref 20.0–28.0)
Bicarbonate: 32.8 mmol/L — ABNORMAL HIGH (ref 20.0–28.0)
Calcium, Ion: 1.07 mmol/L — ABNORMAL LOW (ref 1.15–1.40)
Calcium, Ion: 1.17 mmol/L (ref 1.15–1.40)
HCT: 40 % (ref 39.0–52.0)
HCT: 40 % (ref 39.0–52.0)
Hemoglobin: 13.6 g/dL (ref 13.0–17.0)
Hemoglobin: 13.6 g/dL (ref 13.0–17.0)
O2 Saturation: 94 %
O2 Saturation: 76 %
Potassium: 3.9 mmol/L (ref 3.5–5.1)
Potassium: 4.6 mmol/L (ref 3.5–5.1)
Sodium: 141 mmol/L (ref 135–145)
Sodium: 140 mmol/L (ref 135–145)
TCO2: 34 mmol/L — ABNORMAL HIGH (ref 22–32)
TCO2: 34 mmol/L — ABNORMAL HIGH (ref 22–32)
pCO2, Ven: 46.9 mmHg (ref 44–60)
pCO2, Ven: 55.1 mmHg (ref 44–60)
pH, Ven: 7.446 — ABNORMAL HIGH (ref 7.25–7.43)
pH, Ven: 7.383 (ref 7.25–7.43)
pO2, Ven: 69 mmHg — ABNORMAL HIGH (ref 32–45)
pO2, Ven: 42 mmHg (ref 32–45)

## 2023-12-08 LAB — COMPREHENSIVE METABOLIC PANEL WITH GFR
ALT: 18 U/L (ref 0–44)
AST: 20 U/L (ref 15–41)
Albumin: 3.5 g/dL (ref 3.5–5.0)
Alkaline Phosphatase: 102 U/L (ref 38–126)
Anion gap: 16 — ABNORMAL HIGH (ref 5–15)
BUN: 24 mg/dL — ABNORMAL HIGH (ref 6–20)
CO2: 27 mmol/L (ref 22–32)
Calcium: 9 mg/dL (ref 8.9–10.3)
Chloride: 98 mmol/L (ref 98–111)
Creatinine, Ser: 1.24 mg/dL (ref 0.61–1.24)
GFR, Estimated: >60 mL/min (ref >60)
Glucose, Bld: 191 mg/dL — ABNORMAL HIGH (ref 70–99)
Potassium: 3.9 mmol/L (ref 3.5–5.1)
Sodium: 141 mmol/L (ref 135–145)
Total Bilirubin: 0.7 mg/dL (ref 0.0–1.2)
Total Protein: 6.5 g/dL (ref 6.5–8.1)

## 2023-12-08 LAB — ECHOCARDIOGRAM COMPLETE BUBBLE STUDY
AR max vel: 2.33 cm2
AV Peak grad: 9.5 mmHg
Ao pk vel: 1.54 m/s
Area-P 1/2: 5.97 cm2
Calc EF: 27.3 %
S' Lateral: 4.8 cm
Single Plane A2C EF: 29.1 %
Single Plane A4C EF: 25.8 %

## 2023-12-08 LAB — CBC
HCT: 42.3 % (ref 39.0–52.0)
Hemoglobin: 13.9 g/dL (ref 13.0–17.0)
MCH: 27.8 pg (ref 26.0–34.0)
MCHC: 32.9 g/dL (ref 30.0–36.0)
MCV: 84.6 fL (ref 80.0–100.0)
Platelets: 207 K/uL (ref 150–400)
RBC: 5 MIL/uL (ref 4.22–5.81)
RDW: 13.8 % (ref 11.5–15.5)
WBC: 10.5 K/uL (ref 4.0–10.5)
nRBC: 0 % (ref 0.0–0.2)

## 2023-12-08 LAB — GLUCOSE, CAPILLARY
Glucose-Capillary: 216 mg/dL — ABNORMAL HIGH (ref 70–99)
Glucose-Capillary: 192 mg/dL — ABNORMAL HIGH (ref 70–99)

## 2023-12-08 LAB — URINALYSIS, ROUTINE W REFLEX MICROSCOPIC
Bacteria, UA: NONE SEEN
Bilirubin Urine: NEGATIVE
Glucose, UA: 50 mg/dL — AB
Hgb urine dipstick: NEGATIVE
Ketones, ur: NEGATIVE mg/dL
Leukocytes,Ua: NEGATIVE
Nitrite: NEGATIVE
Protein, ur: 100 mg/dL — AB
Specific Gravity, Urine: >1.046 — ABNORMAL HIGH (ref 1.005–1.030)
pH: 7 (ref 5.0–8.0)

## 2023-12-08 LAB — RAPID URINE DRUG SCREEN, HOSP PERFORMED
Amphetamines: NOT DETECTED
Barbiturates: NOT DETECTED
Benzodiazepines: NOT DETECTED
Cocaine: NOT DETECTED
Opiates: NOT DETECTED
Tetrahydrocannabinol: POSITIVE — AB

## 2023-12-08 LAB — I-STAT CHEM 8, ED
BUN: 29 mg/dL — ABNORMAL HIGH (ref 6–20)
Calcium, Ion: 1.08 mmol/L — ABNORMAL LOW (ref 1.15–1.40)
Chloride: 102 mmol/L (ref 98–111)
Creatinine, Ser: 1.2 mg/dL (ref 0.61–1.24)
Glucose, Bld: 186 mg/dL — ABNORMAL HIGH (ref 70–99)
HCT: 43 % (ref 39.0–52.0)
Hemoglobin: 14.6 g/dL (ref 13.0–17.0)
Potassium: 4 mmol/L (ref 3.5–5.1)
Sodium: 141 mmol/L (ref 135–145)
TCO2: 31 mmol/L (ref 22–32)

## 2023-12-08 LAB — DIFFERENTIAL
Abs Immature Granulocytes: 0.05 K/uL (ref 0.00–0.07)
Basophils Absolute: 0.1 K/uL (ref 0.0–0.1)
Basophils Relative: 1 %
Eosinophils Absolute: 0.1 K/uL (ref 0.0–0.5)
Eosinophils Relative: 1 %
Immature Granulocytes: 1 %
Lymphocytes Relative: 14 %
Lymphs Abs: 1.5 K/uL (ref 0.7–4.0)
Monocytes Absolute: 0.5 K/uL (ref 0.1–1.0)
Monocytes Relative: 5 %
Neutro Abs: 8.3 K/uL — ABNORMAL HIGH (ref 1.7–7.7)
Neutrophils Relative %: 78 %

## 2023-12-08 LAB — CBG MONITORING, ED: Glucose-Capillary: 201 mg/dL — ABNORMAL HIGH (ref 70–99)

## 2023-12-08 LAB — PROTIME-INR
INR: 1 (ref 0.8–1.2)
Prothrombin Time: 14 s (ref 11.4–15.2)

## 2023-12-08 LAB — APTT: aPTT: 31 s (ref 24–36)

## 2023-12-08 LAB — HIV ANTIBODY (ROUTINE TESTING W REFLEX): HIV Screen 4th Generation wRfx: NONREACTIVE

## 2023-12-08 LAB — ETHANOL: Alcohol, Ethyl (B): <15 mg/dL (ref <15)

## 2023-12-08 LAB — I-STAT CG4 LACTIC ACID, ED: Lactic Acid, Venous: 1.4 mmol/L (ref 0.5–1.9)

## 2023-12-08 MED ORDER — FUROSEMIDE 20 MG PO TABS: 20.0000 mg | ORAL_TABLET | Freq: Every day | ORAL | Status: DC | PRN

## 2023-12-08 MED ORDER — CLOPIDOGREL BISULFATE 75 MG PO TABS
75.0000 mg | ORAL_TABLET | Freq: Every day | ORAL | Status: DC
  Administered 2023-12-08 – 2023-12-10 (×3): 75 mg via ORAL
  Filled 2023-12-08 (×3): qty 1

## 2023-12-08 MED ORDER — INSULIN ASPART 100 UNIT/ML IJ SOLN: 0.0000 [IU] | Freq: Every day | INTRAMUSCULAR | Status: DC

## 2023-12-08 MED ORDER — SENNOSIDES-DOCUSATE SODIUM 8.6-50 MG PO TABS: 1.0000 | ORAL_TABLET | Freq: Every evening | ORAL | Status: DC | PRN

## 2023-12-08 MED ORDER — SODIUM CHLORIDE 0.9% FLUSH
3.0000 mL | Freq: Once | INTRAVENOUS | Status: AC
  Administered 2023-12-08: 3 mL via INTRAVENOUS

## 2023-12-08 MED ORDER — INSULIN GLARGINE-YFGN 100 UNIT/ML ~~LOC~~ SOLN
20.0000 [IU] | Freq: Every day | SUBCUTANEOUS | Status: DC
  Administered 2023-12-08 – 2023-12-09 (×2): 20 [IU] via SUBCUTANEOUS
  Filled 2023-12-08 (×3): qty 0.2

## 2023-12-08 MED ORDER — IOHEXOL 350 MG/ML SOLN
100.0000 mL | Freq: Once | INTRAVENOUS | Status: AC | PRN
  Administered 2023-12-08: 100 mL via INTRAVENOUS

## 2023-12-08 MED ORDER — STROKE: EARLY STAGES OF RECOVERY BOOK
Freq: Once | Status: AC
  Administered 2023-12-09: 1
  Filled 2023-12-08: qty 1

## 2023-12-08 MED ORDER — INSULIN ASPART 100 UNIT/ML IJ SOLN
0.0000 [IU] | Freq: Three times a day (TID) | INTRAMUSCULAR | Status: DC
  Administered 2023-12-08 – 2023-12-09 (×2): 5 [IU] via SUBCUTANEOUS
  Administered 2023-12-09: 3 [IU] via SUBCUTANEOUS
  Administered 2023-12-09: 8 [IU] via SUBCUTANEOUS
  Administered 2023-12-10 (×2): 3 [IU] via SUBCUTANEOUS

## 2023-12-08 MED ORDER — ASPIRIN 81 MG PO CHEW
324.0000 mg | CHEWABLE_TABLET | Freq: Once | ORAL | Status: AC
  Administered 2023-12-08: 324 mg via ORAL
  Filled 2023-12-08: qty 4

## 2023-12-08 MED ORDER — ASPIRIN 81 MG PO CHEW
81.0000 mg | CHEWABLE_TABLET | Freq: Every day | ORAL | Status: DC
  Administered 2023-12-09 – 2023-12-10 (×2): 81 mg via ORAL
  Filled 2023-12-08 (×2): qty 1

## 2023-12-08 MED ORDER — LACTATED RINGERS IV BOLUS
500.0000 mL | Freq: Once | INTRAVENOUS | Status: AC
  Administered 2023-12-08: 500 mL via INTRAVENOUS

## 2023-12-08 MED ORDER — ENOXAPARIN SODIUM 40 MG/0.4ML IJ SOSY
40.0000 mg | PREFILLED_SYRINGE | Freq: Every day | INTRAMUSCULAR | Status: DC
  Administered 2023-12-08 – 2023-12-10 (×3): 40 mg via SUBCUTANEOUS
  Filled 2023-12-08 (×3): qty 0.4

## 2023-12-08 NOTE — ED Notes (Signed)
 CCMD called.

## 2023-12-08 NOTE — Plan of Care (Signed)
  Problem: Education: Goal: Knowledge of disease or condition will improve Outcome: Progressing   Problem: Ischemic Stroke/TIA Tissue Perfusion: Goal: Complications of ischemic stroke/TIA will be minimized Outcome: Progressing   Problem: Coping: Goal: Will verbalize positive feelings about self Outcome: Progressing

## 2023-12-08 NOTE — H&P (Signed)
 Date: 12/08/2023               Patient Name:  Kenneth Bailey MRN: [id number]  DOB: 1965/10/04 Age / Sex: 58 y.o., male   PCP: Lonna Leonor HERO, NP         Medical Service: Internal Medicine Teaching Service         Attending Physician: Dr. Mliss Foot      First Contact: Schuyler Novak, DO    Second Contact: Dr. Hadassah Kristy Ahr, MD          Pager Information: First Contact Pager: [phone]   Second Contact Pager: [phone]   SUBJECTIVE   Chief Complaint: Weakness  History of Present Illness: Kenneth Bailey is a 58 y.o. male with PMH of CHF, CAD s/p CABG, COPD, T2DM, Postural hypotension, and HLD. He is very sleepy on exam and history was difficult to obtain. He reports that he woke up this morning and was feeling very weak and unstable on his feet. He describes lightheadedness and the need to sit down. He was concerned that he was having a stroke and called the ambulance. Per EMS he became very drowsy during the ride and developed a facial droop. Upon our discussion, no facial dropp was noted. He did fall asleep multiple times during our conversation and was seen frequently with his eyes rolling back in his head. He reports one episode of emesis last night without blood. He denies vision changes, syncope, chest pain, shortness of breath, or palpitations.   ED Course: Code stroke with neurology consulted. Labs unremarkable. CT without acute abnormality. MRI pending Imaging CTA with string sign stenosis in L vertebral artery and severe right vertebral stenosis Received aspirin  325 and plavix  75. LR bolus Consulted neurology   Meds:  Patient reported:  Metformin Insulin --40-50units glargine Fish oil Plavix --takes once per week Lasix --takes when swelling in ankles  Current Meds  Medication Sig   acetaminophen  (TYLENOL ) 500 MG tablet Take 1,000 mg by mouth 2 (two) times daily as needed for moderate pain (pain score 4-6), fever or headache.   clopidogrel  (PLAVIX ) 75  MG tablet Take 1 tablet (75 mg total) by mouth daily.   furosemide  (LASIX ) 20 MG tablet Take 20 mg by mouth daily as needed for fluid or edema.   ibuprofen (ADVIL) 200 MG tablet Take 400 mg by mouth 2 (two) times daily as needed for headache or moderate pain (pain score 4-6).   icosapent  Ethyl (VASCEPA ) 1 g capsule Take 2 capsules (2 g total) by mouth 2 (two) times daily.   LANTUS  SOLOSTAR 100 UNIT/ML Solostar Pen Inject 46-48 Units into the skin daily as needed (BS > 170).   metFORMIN (GLUCOPHAGE) 1000 MG tablet Take 1,000 mg by mouth 2 (two) times daily.   omeprazole (PRILOSEC) 40 MG capsule Take 40 mg by mouth at bedtime as needed (heartburn).   REPATHA  SURECLICK 140 MG/ML SOAJ Inject 140 mg into the skin See admin instructions. Inject 140mg  into the skin every other Monday.   traZODone (DESYREL) 50 MG tablet Take 100-150 mg by mouth at bedtime as needed for sleep.    Past Medical History CHF s/p CABG--unknown classification DM Smoke 1pk/day x 54yrs  Past Surgical History Past Surgical History:  Procedure Laterality Date   ABDOMINAL AORTOGRAM W/LOWER EXTREMITY N/A 11/03/2022   Procedure: ABDOMINAL AORTOGRAM W/LOWER EXTREMITY;  Surgeon: Gretta Lonni PARAS, MD;  Location: [facility];  Service: Cardiovascular;  Laterality: N/A;   AMPUTATION Left 10/31/2022   Procedure:  AMPUTATION LEFT FIFTH TOE;  Surgeon: Joya Stabs, DPM;  Location: [facility] OR;  Service: Podiatry;  Laterality: Left;   AMPUTATION Left 11/15/2022   Procedure: AMPUTATION FITH RAY;  Surgeon: Janit Thresa HERO, DPM;  Location: [facility] OR;  Service: Podiatry;  Laterality: Left;   AMPUTATION TOE Left 10/31/2022   Procedure: IRRIGATION AND DEBRIDEMENTOF LEFT FOOT;  Surgeon: Joya Stabs, DPM;  Location: [facility] OR;  Service: Podiatry;  Laterality: Left;   BIOPSY  11/05/2022   Procedure: BIOPSY;  Surgeon: Rollin Dover, MD;  Location: [facility] ENDOSCOPY;  Service: Gastroenterology;;   CORONARY ARTERY BYPASS GRAFT N/A 06/10/2021   Procedure:  CORONARY ARTERY BYPASS GRAFTING (CABG) TIMES FOUR, USING LEFT INTERNAL MAMMARY ARTERY AND RIGHT GREATER SAPHENOUS VEIN HARVESTED ENDOSCOPICALLY;  Surgeon: Obadiah Coy, MD;  Location: [facility] OR;  Service: Open Heart Surgery;  Laterality: N/A;   ENDOVEIN HARVEST OF GREATER SAPHENOUS VEIN Right 06/10/2021   Procedure: ENDOVEIN HARVEST OF GREATER SAPHENOUS VEIN;  Surgeon: Obadiah Coy, MD;  Location: [facility] OR;  Service: Open Heart Surgery;  Laterality: Right;   ESOPHAGOGASTRODUODENOSCOPY N/A 11/24/2022   Procedure: ESOPHAGOGASTRODUODENOSCOPY (EGD);  Surgeon: Wilhelmenia Aloha Raddle., MD;  Location: [facility] ENDOSCOPY;  Service: Gastroenterology;  Laterality: N/A;   ESOPHAGOGASTRODUODENOSCOPY (EGD) WITH PROPOFOL  N/A 11/05/2022   Procedure: ESOPHAGOGASTRODUODENOSCOPY (EGD) WITH PROPOFOL ;  Surgeon: Rollin Dover, MD;  Location: [facility] ENDOSCOPY;  Service: Gastroenterology;  Laterality: N/A;   HEMOSTASIS CLIP PLACEMENT  11/24/2022   Procedure: HEMOSTASIS CLIP PLACEMENT;  Surgeon: Wilhelmenia Aloha Raddle., MD;  Location: [facility] ENDOSCOPY;  Service: Gastroenterology;;   HEMOSTASIS CONTROL  11/24/2022   Procedure: HEMOSTASIS CONTROL;  Surgeon: Wilhelmenia Aloha Raddle., MD;  Location: [facility] ENDOSCOPY;  Service: Gastroenterology;;   HOT HEMOSTASIS N/A 11/24/2022   Procedure: HOT HEMOSTASIS (ARGON PLASMA COAGULATION/BICAP);  Surgeon: Wilhelmenia Aloha Raddle., MD;  Location: [facility] ENDOSCOPY;  Service: Gastroenterology;  Laterality: N/A;   I & D EXTREMITY Left 11/08/2022   Procedure: IRRIGATION AND DEBRIDEMENT OF FOOT AND WASHOUT;  Surgeon: Gershon Donnice SAUNDERS, DPM;  Location: [facility] OR;  Service: Podiatry;  Laterality: Left;   PERIPHERAL VASCULAR BALLOON ANGIOPLASTY  11/03/2022   Procedure: PERIPHERAL VASCULAR BALLOON ANGIOPLASTY;  Surgeon: Gretta Lonni PARAS, MD;  Location: [facility];  Service: Cardiovascular;;   RIGHT/LEFT HEART CATH AND CORONARY ANGIOGRAPHY N/A 06/06/2021   Procedure: RIGHT/LEFT HEART CATH AND CORONARY ANGIOGRAPHY;   Surgeon: Cherrie Toribio SAUNDERS, MD;  Location: [facility];  Service: Cardiovascular;  Laterality: N/A;   TEE WITHOUT CARDIOVERSION N/A 06/10/2021   Procedure: TRANSESOPHAGEAL ECHOCARDIOGRAM (TEE);  Surgeon: Obadiah Coy, MD;  Location: [facility] OR;  Service: Open Heart Surgery;  Laterality: N/A;     Social:  Lives With: Occupation: Support: Level of Function: PCP:  Lonna Leonor HERO, NP  Substances: -Tobacco: 52pk year history -Alcohol: denies -Recreational Drug: Denies  Family History:  Family History  Problem Relation Age of Onset   Hypertension Mother    Cancer Mother    Heart disease Father      Allergies: Allergies as of 12/08/2023 - Review Complete 12/08/2023  Allergen Reaction Noted   Neurontin  [gabapentin ] Other (See Comments) 12/08/2023   Statins Swelling 06/24/2021   Zestril [lisinopril] Swelling 06/24/2021    Review of Systems: A complete ROS was negative except as per HPI.   OBJECTIVE:   Physical Exam: Blood pressure (!) 148/89, pulse 91, temperature (!) 97.4 F (36.3 C), temperature source Rectal, resp. rate (!) 27, height 5' 8 (1.727 m), weight 81 kg, SpO2 100%.  Constitutional: lethargic male in no acute  distress.  HENT: normocephalic atraumatic, mucous membranes moist Cardiovascular: regular rate and rhythm, no m/r/g Pulmonary/Chest: normal work of breathing on room air, lungs clear to auscultation bilaterally Abdominal: soft, non-tender, non-distended Neurological: CN II-XII intact. Diffuse muscle weakness noted bilaterally. 3+/5 bilateral finger flexion. 4/5 bilateral LE and UE  Skin: Dry, pink. Distal lower extremities cold to touch.  Psych: Lethargic and inattentive to conversation.   Labs: CBC    Component Value Date/Time   WBC 10.5 12/08/2023 0823   RBC 5.00 12/08/2023 0823   HGB 13.6 12/08/2023 0902   HGB 15.8 08/29/2023 0913   HCT 40.0 12/08/2023 0902   PLT 207 12/08/2023 0823   PLT 233 08/29/2023 0913   MCV 84.6 12/08/2023 0823    MCH 27.8 12/08/2023 0823   MCHC 32.9 12/08/2023 0823   RDW 13.8 12/08/2023 0823   LYMPHSABS 1.5 12/08/2023 0823   MONOABS 0.5 12/08/2023 0823   EOSABS 0.1 12/08/2023 0823   BASOSABS 0.1 12/08/2023 0823     CMP     Component Value Date/Time   NA 141 12/08/2023 0902   NA 138 04/02/2023 1410   K 3.9 12/08/2023 0902   CL 102 12/08/2023 0830   CO2 27 12/08/2023 0823   GLUCOSE 186 (H) 12/08/2023 0830   BUN 29 (H) 12/08/2023 0830   BUN 27 (H) 04/02/2023 1410   CREATININE 1.20 12/08/2023 0830   CREATININE 1.25 (H) 08/29/2023 0913   CREATININE 1.41 (H) 01/01/2023 1020   CALCIUM  9.0 12/08/2023 0823   PROT 6.5 12/08/2023 0823   PROT 6.8 04/02/2023 1410   ALBUMIN  3.5 12/08/2023 0823   ALBUMIN  4.2 04/02/2023 1410   AST 20 12/08/2023 0823   AST 18 08/29/2023 0913   ALT 18 12/08/2023 0823   ALT 20 08/29/2023 0913   ALKPHOS 102 12/08/2023 0823   BILITOT 0.7 12/08/2023 0823   BILITOT 0.5 08/29/2023 0913   GFRNONAA >60 12/08/2023 0823   GFRNONAA >60 08/29/2023 0913    Imaging: DG Chest Port 1 View Result Date: 12/08/2023 CLINICAL DATA:  Altered mental status. EXAM: PORTABLE CHEST 1 VIEW COMPARISON:  10/09/2023 FINDINGS: Status post median sternotomy and CABG procedure. Stable cardiomediastinal contours. Decreased lung volumes. Increased pulmonary vascular congestion with mild prominent peripheral septal markings in the lower lung zones. Subsegmental atelectasis noted in the left base. No significant effusion. IMPRESSION: 1. Increased pulmonary vascular congestion and mild interstitial edema. 2. Left base subsegmental atelectasis. Electronically Signed   By: Waddell Calk M.D.   On: 12/08/2023 09:18   CT ANGIO HEAD NECK W WO CM W PERF (CODE STROKE) Result Date: 12/08/2023 CLINICAL DATA:  58 year old male code stroke presentation. EXAM: CT ANGIOGRAPHY HEAD AND NECK CT PERFUSION BRAIN TECHNIQUE: Multidetector CT imaging of the head and neck was performed using the standard protocol during  bolus administration of intravenous contrast. Multiplanar CT image reconstructions and MIPs were obtained to evaluate the vascular anatomy. Carotid stenosis measurements (when applicable) are obtained utilizing NASCET criteria, using the distal internal carotid diameter as the denominator. Multiphase CT imaging of the brain was performed following IV bolus contrast injection. Subsequent parametric perfusion maps were calculated using RAPID software. RADIATION DOSE REDUCTION: This exam was performed according to the departmental dose-optimization program which includes automated exposure control, adjustment of the mA and/or kV according to patient size and/or use of iterative reconstruction technique. CONTRAST:  OMNIPAQUE  IOHEXOL  350 MG/ML SOLN COMPARISON:  Plain head CT 0830 hours today. FINDINGS: CT Brain Perfusion Findings: ASPECTS: 10 CBF (<30%) Volume: None Perfusion (  Tmax>6.0s) volume: None Mismatch Volume: Not applicable Infarction Location:Not applicable CTA NECK Skeleton: Prior sternotomy. Absent dentition. Cervical spine degeneration. No acute osseous abnormality identified. Upper chest: Prior CABG. Mild upper lung atelectasis, possible underlying emphysema. Other neck: Nonvascular neck soft tissue spaces are within normal limits. Aortic arch: 3 vessel arch. Prior CABG. Soft more so than calcified arch atherosclerosis. Right carotid system: Mild soft plaque of the brachiocephalic and proximal right CCA without stenosis. Moderate soft and calcified plaque right ICA origin and bulb with 60 % stenosis with respect to the distal vessel. Left carotid system: Left CCA soft plaque without stenosis. Soft and calcified plaque of the proximal left ICA with less than 50 % stenosis with respect to the distal vessel. Vertebral arteries: Soft plaque right subclavian artery origin without stenosis. Soft plaque at the right vertebral artery origin with moderate to severe stenosis as seen on series 8, image 176. Right  vertebral remains patent and is otherwise negative to the skull base. Proximal left subclavian soft plaque without stenosis. Severe stenosis left vertebral artery origin, radiographic string sign (series 5, image 286). Diminutive caliber of the left vertebral artery although it does remain patent to the skull base. Left vertebral appears mildly irregular throughout. CTA HEAD Posterior circulation: Distal left vertebral artery is nearly occluded at the left PICA origin, and likewise appears highly diminutive and functionally occluded just distal to the left PICA which remains patent. Right vertebral V4 segment appears somewhat dominant and is patent to the vertebrobasilar junction with mild irregularity. Right PICA remains patent. Basilar artery remains patent. No basilar stenosis. SCA and right PCA origins are normal. Fetal type left PCA origin. Bilateral PCA branches are within normal limits. Anterior circulation: Both ICA siphons are patent although atherosclerotic. Left siphon combined soft and calcified atherosclerosis with mild to moderate siphon stenosis perhaps most pronounced in the petrous segment on series 11, image 168. Contralateral right ICA siphon similar widespread atherosclerosis with both soft and calcified plaque. Only mild right siphon stenosis. Patent carotid termini. Normal left posterior communicating artery origin. Patent and normal MCA and ACA origins. Normal anterior communicating artery. Bilateral ACA branches are within normal limits. Bilateral MCA M1 segments and branches are within normal limits. Venous sinuses: Patent. Venous mixing artifact within the right IJ (greater than 150 Hounsfield units). Anatomic variants: Fetal type left PCA origin. Affectively dominant right vertebral artery. Review of the MIP images confirms the above findings Preliminary of the above was discussed by telephone with Dr. ELIDA ROSS on 12/08/2023 at 08:57 . IMPRESSION: 1. Negative CT Perfusion. 2. CTA is  negative for ELVO, but Positive for Severe bilateral Vertebral Artery stenoses: - string sign stenosis Left vertebral origin with diminutive left vert and functionally occluded distal left V4 segment (left PICA remains patent). - Severe Right vertebral origin stenosis. Otherwise better right vertebral patency to the basilar. Basilar and remaining posterior circulation within normal limits. 3. Bilateral carotid atherosclerosis is Moderate. 60% stenosis proximal Right ICA. Up to moderate stenosis left ICA siphon. 4.  Aortic Atherosclerosis (ICD10-I70.0).  Prior CABG. Salient findings discussed by telephone with Dr. ELIDA ROSS on 12/08/2023 at 08:57 . Electronically Signed   By: VEAR Hurst M.D.   On: 12/08/2023 09:04   CT HEAD CODE STROKE WO CONTRAST Result Date: 12/08/2023 CLINICAL DATA:  Code stroke.  58 year old male. EXAM: CT HEAD WITHOUT CONTRAST TECHNIQUE: Contiguous axial images were obtained from the base of the skull through the vertex without intravenous contrast. RADIATION DOSE REDUCTION: This exam was performed  according to the departmental dose-optimization program which includes automated exposure control, adjustment of the mA and/or kV according to patient size and/or use of iterative reconstruction technique. COMPARISON:  Head CT 01/21/2023. FINDINGS: Brain: Cerebral volume remains normal. No midline shift, ventriculomegaly, mass effect, evidence of mass lesion, intracranial hemorrhage or evidence of cortically based acute infarction. Gray-white differentiation appears stable and within normal limits throughout the brain. Vascular: No suspicious intracranial vascular hyperdensity. Calcified atherosclerosis at the skull base. Skull: Intact.  No acute osseous abnormality identified. Sinuses/Orbits: Visualized paranasal sinuses and mastoids are stable and well aerated. Other: Leftward gaze. Visualized scalp soft tissues are within normal limits. ASPECTS [facility] Stroke Program Early CT Score) Total score  (0-10 with 10 being normal): 10 IMPRESSION: Stable and normal for age noncontrast CT appearance of the brain. ASPECTS 10. These results were communicated to Dr. Matthews at 8:40 am on 12/08/2023 by text page via the [facility] messaging system. Electronically Signed   By: VEAR Hurst M.D.   On: 12/08/2023 08:40     EKG: personally reviewed my interpretation is NSR.  ASSESSMENT & PLAN:   Assessment & Plan by Problem: Principal Problem:   TIA (transient ischemic attack) Active Problems:   Hx of CABG   Diabetes mellitus, type 2 (HCC)   COPD (chronic obstructive pulmonary disease) (HCC)   Postural hypotension   Chronic systolic CHF (congestive heart failure) (HCC)   Kenneth Bailey is a 58 y.o. person living with a history of CHF, CAD s/p CABG, COPD, T2DM, Postural hypotension, and HLD who presented with weakness and slurred speech and admitted for stroke work up on hospital day 0  Stroke Workup New onset diffuse weakness Pt presented with lightheadedness, weakness, and reported facial droop. CT was unremarkable on admission. The patient remains having diffuse weakness and significant drowsiness. TIA vs Acute infarct suspected in the ED. Neurology following -CTA demonstrated significant bilateral vertebral artery stenosis.  -MRI pending -TTE with bubble study pending -SBP goal >130 per neurology -Will consult PT/OT -Due to lightheadedness this morning and hx of postural hypotension, will obtain orthostatics   Lethargy Lethargy present on exam today with pt frequently falling asleep during conversation and eyes seen rolling back in his head. Due to hx of CHF and severe bilateral stenosis, suspect this may be due to hypoperfusion. -Lactic Acid 1.4 -Pt denies substance or alcohol use.  -Will await results of UDS and EEG.   7/19 1600--UDS resulted positive for THC. Pt endorses fatigue and strong appetite. Likely under the influence of marijuana. Per nursing he is doing much better this afternoon  and is more alert. Will continue to monitor.   HRrEF 30-35% CAD s/p CABG 2013 Previous Echo 55-60% in 2024. Per cardiology, pt has refused GDMT due to it making him not feel well.  Lactic acid 1.4 -Will resume home lasix  while inpatient -Mg>2, K>4  T2DM Last A1c 8.6. Pt takes 40-50units glargine at home.  -Will initiate 20units semglee  and SSI -AC/HS CBG checks  Best practice: Diet: Heart Healthy VTE: on hold--will revisit after MRI IVF: None,None Code: Full  Disposition planning: Prior to Admission Living Arrangement: Home, living alone Anticipated Discharge Location: Home  Dispo: Admit patient to Observation with expected length of stay less than 2 midnights.  Signed: Myrna Bitters, DO Internal Medicine Resident  12/08/2023, 11:10 AM  Please contact IM Residency On-Call Pager at: [phone] or [phone].

## 2023-12-08 NOTE — ED Triage Notes (Signed)
 Pt to ED via [facility].  Pt states he was fine when he went to bed at midnight last night. Pt woke up this morning at 0740 and felt weak and shaky all over. EMS noted slurred speech and left sided facial drooping. Pt has intermittent changes in consciousness, responds to touch. Code stroke activated by EMS at 0757. Stroke team at bedside on arrival. Pt to Ct scan.  EMS VS 176/84 > 126/84 HR 113 Cbg 138 96% RA 18g RAC

## 2023-12-08 NOTE — Progress Notes (Signed)
 EEG completed, results pending.

## 2023-12-08 NOTE — Evaluation (Signed)
 Occupational Therapy Evaluation Patient Details Name: Kenneth Bailey MRN: [id number] DOB: April 07, 1966 Today's Date: 12/08/2023   History of Present Illness   58 y.o. male admitted on 12/08/23 with slurred speech and L facial drooping. Pt out of TNK window. CT positive for severe bilat vertebral artery stenosis, MRI pending. PMH significant for DMII, diabetic polyneuropathy, CABG, CHF, current smoker, Lt foot I&D with 5th toe resection 6/24.     Clinical Impressions Pt admitted for above, PTA pt lived alone and reports being ind with ADLs/iADls and still driving, ambulates no AD typically but owns [facility]. Pt currently demonstrating mild strength and LUE coordination deficits, seems to have STM deficits but unsure if that is his baseline. Pt able to progress mobility to supervision no AD, completing ADLs with CGA to supervision assist at this time. He did have some L visual challenges despite accurately identifying objects within L field. Recommend pt follow-up with eye doctor post acute. OT to follow-up with pt to determine his ind with medication management.      If plan is discharge home, recommend the following:   Assist for transportation     Functional Status Assessment   Patient has not had a recent decline in their functional status     Equipment Recommendations   None recommended by OT     Recommendations for Other Services         Precautions/Restrictions   Precautions Recall of Precautions/Restrictions: Intact Restrictions Weight Bearing Restrictions Per Provider Order: No     Mobility Bed Mobility Overal bed mobility: Needs Assistance Bed Mobility: Supine to Sit     Supine to sit: Supervision     General bed mobility comments: supervision for safety    Transfers Overall transfer level: Needs assistance Equipment used: None Transfers: Sit to/from Stand Sit to Stand: Contact guard assist           General transfer comment: initially CGA for  bal and gait, pt progressing to Supervision with continued OOB mobility. Gait stride improving with increased activity.      Balance Overall balance assessment: Needs assistance Sitting-balance support: No upper extremity supported, Feet supported Sitting balance-Leahy Scale: Good       Standing balance-Leahy Scale: Fair Standing balance comment: no AD                           ADL either performed or assessed with clinical judgement   ADL Overall ADL's : Needs assistance/impaired Eating/Feeding: Independent;Sitting   Grooming: Standing;Supervision/safety   Upper Body Bathing: Standing;Supervision/ safety   Lower Body Bathing: Set up;Sitting/lateral leans   Upper Body Dressing : Sitting;Independent   Lower Body Dressing: Sit to/from stand;Contact guard assist   Toilet Transfer: Supervision/safety;Ambulation   Toileting- Clothing Manipulation and Hygiene: Supervision/safety;Sit to/from stand       Functional mobility during ADLs: Supervision/safety (no AD.)       Vision Baseline Vision/History: 1 Wears glasses Ability to See in Adequate Light: 1 Impaired Patient Visual Report: No change from baseline Vision Assessment?: Yes Eye Alignment: Within Functional Limits Ocular Range of Motion: Within Functional Limits Alignment/Gaze Preference: Within Defined Limits Tracking/Visual Pursuits: Able to track stimulus in all quads without difficulty Convergence: Impaired (comment) (impaired convergence of L eye.) Visual Fields: No apparent deficits Diplopia Assessment:  (Pt denies) Additional Comments: Pt notes some difficulty with identifying # of therapists fingers in L visual field of L eye. has a hx of eye weakness at baseline, still accurately stated  the # of fingers.     Perception Perception: Within Functional Limits       Praxis Praxis: WFL       Pertinent Vitals/Pain Pain Assessment Pain Assessment: No/denies pain     Extremity/Trunk  Assessment Upper Extremity Assessment Upper Extremity Assessment: Right hand dominant;LUE deficits/detail;RUE deficits/detail;Overall [facility] for tasks assessed RUE Deficits / Details: Rt shoudler flexion 3+/5 MMT, pt reports he needs a shoulder replacement. FTN and ROM WFL RUE Sensation: WNL RUE Coordination: WNL LUE Deficits / Details: Grasp weaker compared to R. Still functoinal. ROM WFL. mild FTN difference when compared to R. LUE Sensation: WNL LUE Coordination: WNL   Lower Extremity Assessment Lower Extremity Assessment: Defer to PT evaluation (R stronger than L, L seemingly 4/5 hip flexors and quads on OT assessment)       Communication Communication Communication: No apparent difficulties   Cognition Arousal: Alert Behavior During Therapy: WFL for tasks assessed/performed Cognition: No family/caregiver present to determine baseline             OT - Cognition Comments: 0/3 on STM word recall, accurately completed clock draw assessment and self corrected any errors. Unsure of baseline memory.                 Following commands: Intact       Cueing  General Comments   Cueing Techniques: Verbal cues  VSS RA   Exercises     Shoulder Instructions      Home Living Family/patient expects to be discharged to:: Private residence Living Arrangements: Alone Available Help at Discharge: Family;Friend(s);Available PRN/intermittently Type of Home: Mobile home Home Access: Stairs to enter Entrance Stairs-Number of Steps: 13 Entrance Stairs-Rails: Left Home Layout: One level     Bathroom Shower/Tub: Chief Strategy Officer: Standard     Home Equipment: Cane - single point          Prior Functioning/Environment Prior Level of Function : Independent/Modified Independent;Driving             Mobility Comments: no AD ADLs Comments: ind    OT Problem List: Impaired balance (sitting and/or standing);Decreased strength   OT  Treatment/Interventions: Therapeutic exercise;Patient/family education;Balance training;Therapeutic activities;DME and/or AE instruction      OT Goals(Current goals can be found in the care plan section)   Acute Rehab OT Goals Patient Stated Goal: Go home soon OT Goal Formulation: With patient Time For Goal Achievement: 12/22/23 Potential to Achieve Goals: Good ADL Goals Additional ADL Goal #1: (P) Pt will score WFL on pillbox assessment to determine ind with medication management   OT Frequency:  Min 1X/week    Co-evaluation              AM-PAC OT 6 Clicks Daily Activity     Outcome Measure Help from another person eating meals?: None Help from another person taking care of personal grooming?: A Little Help from another person toileting, which includes using toliet, bedpan, or urinal?: A Little Help from another person bathing (including washing, rinsing, drying)?: A Little Help from another person to put on and taking off regular upper body clothing?: A Little Help from another person to put on and taking off regular lower body clothing?: A Little 6 Click Score: 19   End of Session Equipment Utilized During Treatment: Gait belt Nurse Communication: Mobility status  Activity Tolerance: Patient tolerated treatment well Patient left: in bed;with call bell/phone within reach;with bed alarm set (sitting EOB)  OT Visit Diagnosis: Unsteadiness on feet (  R26.81);Cognitive communication deficit (R41.841) Symptoms and signs involving cognitive functions:  (possible TIA, pending MRI)                Time: 8375-8358 OT Time Calculation (min): 17 min Charges:  OT General Charges $OT Visit: 1 Visit OT Evaluation $OT Eval Low Complexity: 1 Low  12/08/2023  AB, OTR/L  Acute Rehabilitation Services  Office: [phone]   Curtistine JONETTA Das 12/08/2023, 5:52 PM

## 2023-12-08 NOTE — Procedures (Signed)
 Patient Name: Kenneth Bailey  MRN: [id number]  Epilepsy Attending: Arlin MALVA Krebs  Referring Physician/Provider: everitt Clint Abbey Earle FORBES, NP  Date: 12/08/2023 Duration: 27.21 mins  Patient history: 58 yo male presented as code stroke with decreased responsiveness. EEG to evaluate for seizure  Level of alertness: Awake, asleep  AEDs during EEG study: None  Technical aspects: This EEG study was done with scalp electrodes positioned according to the 10-20 International system of electrode placement. Electrical activity was reviewed with band pass filter of 1-70Hz , sensitivity of 7 uV/mm, display speed of 59mm/sec with a 60Hz  notched filter applied as appropriate. EEG data were recorded continuously and digitally stored.  Video monitoring was available and reviewed as appropriate.  Description: The posterior dominant rhythm consists of 8-9 Hz activity of moderate voltage (25-35 uV) seen predominantly in posterior head regions, symmetric and reactive to eye opening and eye closing. Sleep was characterized by vertex waves, sleep spindles (12 to 14 Hz), maximal frontocentral region. Hyperventilation and photic stimulation were not performed.     IMPRESSION: This study is within normal limits. No seizures or epileptiform discharges were seen throughout the recording.  A normal interictal EEG does not exclude the diagnosis of epilepsy.   Needham Biggins O Reyann Troop

## 2023-12-08 NOTE — Code Documentation (Addendum)
 Stroke Response Nurse Documentation Code Documentation  Kenneth Bailey is a 58 y.o. male arriving to [facility]  as Code Stroke activation from [facility]. LKW 0000, pt from home when he woke up this am approximately 0730 and ended up calling EMS himself. EMS noted pt to have gait disturbance, slurred speech, lethargic.   Stroke team met patient at bridge, labs drawn, cleared for CT by EDP Ray. NIH 11, see flowsheet for details. CT/CTA&P completed. OOW for TNK. No LVO. Care Plan: NIH and vitals q2h x12h then per unit routine. Handoff with RN Gustavus Prescott, Chiquita POUR  Rapid Response RN

## 2023-12-08 NOTE — Consult Note (Signed)
 NEUROLOGY CONSULT NOTE   Date of service: December 08, 2023 Patient Name: Kenneth Bailey MRN:  [id number] DOB:  [birth year] Chief Complaint: Gait impairment, inability to walk, generalized weakness Requesting Provider: Levander Houston, MD  History of Present Illness  Kenneth Bailey is a 58 y.o. male with hx of CAD status post CABG in 2023, CHF, COPD, peptic ulcer disease, PAD, diabetes, hyperlipidemia, upper GI bleed and smoking who presents with acute onset generalized weakness, jitteriness, decreased level of consciousness and gait impairment.  Patient reports that he was in his usual state of health when he went to bed around midnight and woke up at about 0740 with generalized weakness, jitteriness and inability to walk normally.  He called EMS and was found to have a decreased LOC as well as dysarthria.  EMS reports no alcohol or drug paraphernalia in patient's residence.  Patient states he has not been ill recently and has not had any sick contacts.  LKW: 0000 Modified rankin score: 1-No significant post stroke disability and can perform usual duties with stroke symptoms IV Thrombolysis: No, outside of window EVT: No, no LVO  NIHSS components Score: Comment  1a Level of Conscious 0[]  1[x]  2[]  3[]      1b LOC Questions 0[x]  1[]  2[]       1c LOC Commands 0[x]  1[]  2[]       2 Best Gaze 0[x]  1[]  2[]       3 Visual 0[x]  1[]  2[]  3[]      4 Facial Palsy 0[]  1[x]  2[]  3[]      5a Motor Arm - left 0[]  1[x]  2[]  3[]  4[]  UN[]    5b Motor Arm - Right 0[]  1[x]  2[]  3[]  4[]  UN[]    6a Motor Leg - Left 0[]  1[]  2[x]  3[]  4[]  UN[]    6b Motor Leg - Right 0[]  1[]  2[x]  3[]  4[]  UN[]    7 Limb Ataxia 0[x]  1[]  2[]  UN[]      8 Sensory 0[]  1[x]  2[]  UN[]      9 Best Language 0[]  1[x]  2[]  3[]      10 Dysarthria 0[]  1[x]  2[]  UN[]      11 Extinct. and Inattention 0[]  1[x]  2[]       TOTAL:12       ROS   Unable to ascertain due to altered mental status  Past History   Past Medical History:  Diagnosis Date   Acute  combined systolic and diastolic heart failure (HCC)    Acute systolic heart failure (HCC) 06/06/2021   Anemia    Atherosclerotic heart disease 09/07/2021   CHF (congestive heart failure) (HCC)    Chronic systolic CHF (congestive heart failure) (HCC) 11/23/2022   COPD (chronic obstructive pulmonary disease) (HCC)    Diabetes mellitus, type 2 (HCC)    Gastric ulcer    HLD (hyperlipidemia)    Hypertension    Hypertension associated with diabetes (HCC) 09/29/2022   Hypertensive heart disease with congestive heart failure (HCC) 03/28/2023   Kidney disease, chronic, stage III (GFR 30-59 ml/min) (HCC)    Multiple duodenal ulcers    Orthostatic hypotension    Osteomyelitis (HCC)    Peripheral arterial disease (HCC)    Postural hypotension 12/20/2016   PUD (peptic ulcer disease)    Tobacco abuse     Past Surgical History:  Procedure Laterality Date   ABDOMINAL AORTOGRAM W/LOWER EXTREMITY N/A 11/03/2022   Procedure: ABDOMINAL AORTOGRAM W/LOWER EXTREMITY;  Surgeon: Gretta Lonni PARAS, MD;  Location: [facility];  Service: Cardiovascular;  Laterality: N/A;   AMPUTATION Left 10/31/2022  Procedure: AMPUTATION LEFT FIFTH TOE;  Surgeon: Joya Stabs, DPM;  Location: [facility] OR;  Service: Podiatry;  Laterality: Left;   AMPUTATION Left 11/15/2022   Procedure: AMPUTATION FITH RAY;  Surgeon: Janit Thresa HERO, DPM;  Location: [facility] OR;  Service: Podiatry;  Laterality: Left;   AMPUTATION TOE Left 10/31/2022   Procedure: IRRIGATION AND DEBRIDEMENTOF LEFT FOOT;  Surgeon: Joya Stabs, DPM;  Location: [facility] OR;  Service: Podiatry;  Laterality: Left;   BIOPSY  11/05/2022   Procedure: BIOPSY;  Surgeon: Rollin Dover, MD;  Location: [facility] ENDOSCOPY;  Service: Gastroenterology;;   CORONARY ARTERY BYPASS GRAFT N/A 06/10/2021   Procedure: CORONARY ARTERY BYPASS GRAFTING (CABG) TIMES FOUR, USING LEFT INTERNAL MAMMARY ARTERY AND RIGHT GREATER SAPHENOUS VEIN HARVESTED ENDOSCOPICALLY;  Surgeon: Obadiah Coy, MD;   Location: [facility] OR;  Service: Open Heart Surgery;  Laterality: N/A;   ENDOVEIN HARVEST OF GREATER SAPHENOUS VEIN Right 06/10/2021   Procedure: ENDOVEIN HARVEST OF GREATER SAPHENOUS VEIN;  Surgeon: Obadiah Coy, MD;  Location: [facility] OR;  Service: Open Heart Surgery;  Laterality: Right;   ESOPHAGOGASTRODUODENOSCOPY N/A 11/24/2022   Procedure: ESOPHAGOGASTRODUODENOSCOPY (EGD);  Surgeon: Wilhelmenia Aloha Raddle., MD;  Location: [facility] ENDOSCOPY;  Service: Gastroenterology;  Laterality: N/A;   ESOPHAGOGASTRODUODENOSCOPY (EGD) WITH PROPOFOL  N/A 11/05/2022   Procedure: ESOPHAGOGASTRODUODENOSCOPY (EGD) WITH PROPOFOL ;  Surgeon: Rollin Dover, MD;  Location: [facility] ENDOSCOPY;  Service: Gastroenterology;  Laterality: N/A;   HEMOSTASIS CLIP PLACEMENT  11/24/2022   Procedure: HEMOSTASIS CLIP PLACEMENT;  Surgeon: Wilhelmenia Aloha Raddle., MD;  Location: [facility] ENDOSCOPY;  Service: Gastroenterology;;   HEMOSTASIS CONTROL  11/24/2022   Procedure: HEMOSTASIS CONTROL;  Surgeon: Wilhelmenia Aloha Raddle., MD;  Location: [facility] ENDOSCOPY;  Service: Gastroenterology;;   HOT HEMOSTASIS N/A 11/24/2022   Procedure: HOT HEMOSTASIS (ARGON PLASMA COAGULATION/BICAP);  Surgeon: Wilhelmenia Aloha Raddle., MD;  Location: [facility] ENDOSCOPY;  Service: Gastroenterology;  Laterality: N/A;   I & D EXTREMITY Left 11/08/2022   Procedure: IRRIGATION AND DEBRIDEMENT OF FOOT AND WASHOUT;  Surgeon: Gershon Donnice SAUNDERS, DPM;  Location: [facility] OR;  Service: Podiatry;  Laterality: Left;   PERIPHERAL VASCULAR BALLOON ANGIOPLASTY  11/03/2022   Procedure: PERIPHERAL VASCULAR BALLOON ANGIOPLASTY;  Surgeon: Gretta Lonni PARAS, MD;  Location: [facility];  Service: Cardiovascular;;   RIGHT/LEFT HEART CATH AND CORONARY ANGIOGRAPHY N/A 06/06/2021   Procedure: RIGHT/LEFT HEART CATH AND CORONARY ANGIOGRAPHY;  Surgeon: Cherrie Toribio SAUNDERS, MD;  Location: [facility];  Service: Cardiovascular;  Laterality: N/A;   TEE WITHOUT CARDIOVERSION N/A 06/10/2021   Procedure: TRANSESOPHAGEAL  ECHOCARDIOGRAM (TEE);  Surgeon: Obadiah Coy, MD;  Location: [facility] OR;  Service: Open Heart Surgery;  Laterality: N/A;    Family History: Family History  Problem Relation Age of Onset   Hypertension Mother    Cancer Mother    Heart disease Father     Social History  reports that he has been smoking cigarettes. He started smoking about 51 years ago. He has a 51.9 pack-year smoking history. He has never been exposed to tobacco smoke. He has never used smokeless tobacco. He reports current alcohol use. He reports that he does not use drugs.  Allergies  Allergen Reactions   Lisinopril     Leg swelling   Statins     Leg swelling    Medications   Current Facility-Administered Medications:    sodium chloride  flush (NS) 0.9 % injection 3 mL, 3 mL, Intravenous, Once, Kenneth Houston, MD  Current Outpatient Medications:    clopidogrel  (PLAVIX ) 75 MG tablet, Take 1 tablet (75 mg  total) by mouth daily., Disp: 90 tablet, Rfl: 3   furosemide  (LASIX ) 20 MG tablet, Take 20 mg by mouth daily as needed for fluid or edema., Disp: , Rfl:    icosapent  Ethyl (VASCEPA ) 1 g capsule, Take 2 capsules (2 g total) by mouth 2 (two) times daily., Disp: 360 capsule, Rfl: 3   Insulin  Syringe-Needle U-100 30G X 1/2 0.3 ML MISC, Use with Novolog , Disp: 90 each, Rfl: 0   LANTUS  SOLOSTAR 100 UNIT/ML Solostar Pen, Inject 46-48 Units into the skin daily., Disp: , Rfl:    metFORMIN (GLUCOPHAGE) 1000 MG tablet, Take 1,000 mg by mouth 2 (two) times daily., Disp: , Rfl:    mupirocin  ointment (BACTROBAN ) 2 %, Apply 1 Application topically daily., Disp: 22 g, Rfl: 0   polyethylene glycol (MIRALAX  / GLYCOLAX ) 17 g packet, Take 17 g by mouth daily as needed for mild constipation., Disp: 14 each, Rfl: 0   REPATHA  SURECLICK 140 MG/ML SOAJ, Inject 140 mg into the skin once a week., Disp: , Rfl:    traZODone (DESYREL) 50 MG tablet, Take 50 mg by mouth at bedtime as needed for sleep., Disp: , Rfl:   Vitals   Vitals:    12-18-23 0800 Dec 18, 2023 0830 12/18/2023 0855  BP:  130/77   Pulse:  89   Resp:  18   Temp:   97.6 F (36.4 C)  TempSrc:   Oral  SpO2:  92%   Weight: 81 kg      Body mass index is 27.14 kg/m.   Physical Exam   Constitutional: Ill-appearing elderly patient in no acute distress Psych: Affect blunted Eyes: No scleral injection.  HENT: No OP obstruction.  Head: Normocephalic.  Cardiovascular: Normal rate and regular rhythm.  Respiratory: Effort normal, non-labored breathing.  Skin: WDI.   Neurologic Examination    NEURO:  Mental Status: Patient is drowsy but arouses to voice and light touch.  He is oriented to person, place, time and situation but slow to respond. Speech/Language: speech is with moderate dysarthria.  He is able to name some objects but not others, but this may be due to poor vision  Cranial Nerves:  II: PERRL.  Blinks to threat bilaterally III, IV, VI: EOMI. Eyelids elevate symmetrically.  V: Unable to feel light touch on either side of face VII: Subtle left facial droop VIII: hearing intact to voice. IX, X: Voice is dysarthric KP:Dynloizm shrug weak but symmetrical XII: tongue is midline without fasciculations. Motor: Able to move all 4 extremities with antigravity strength, but drift present in all extremities Tone: is normal and bulk is normal Sensation- Intact to light touch bilaterally.  Left-sided extinction present on the lower extremities Coordination: FTN intact bilaterally Gait- deferred    Labs/Imaging/Neurodiagnostic studies   CBC:  Recent Labs  Lab 12/18/23 0823 18-Dec-2023 0830  WBC 10.5  --   NEUTROABS 8.3*  --   HGB 13.9 14.6  HCT 42.3 43.0  MCV 84.6  --   PLT 207  --    Basic Metabolic Panel:  Lab Results  Component Value Date   NA 141 December 18, 2023   K 4.0 2023/12/18   CO2 27 08/29/2023   GLUCOSE 186 (H) 12-18-23   BUN 29 (H) 2023/12/18   CREATININE 1.20 2023/12/18   CALCIUM  9.7 08/29/2023   GFRNONAA >60 08/29/2023    Lipid Panel:  Lab Results  Component Value Date   LDLCALC 58 04/02/2023   HgbA1c:  Lab Results  Component Value Date   HGBA1C 10.7 (  H) 10/11/2022   Urine Drug Screen: No results found for: LABOPIA, COCAINSCRNUR, LABBENZ, AMPHETMU, THCU, LABBARB  Alcohol Level No results found for: [facility] INR  Lab Results  Component Value Date   INR 1.0 12/08/2023   APTT  Lab Results  Component Value Date   APTT 31 12/08/2023   AED levels: No results found for: PHENYTOIN, ZONISAMIDE, LAMOTRIGINE, LEVETIRACETA  CT Head without contrast(Personally reviewed): No acute abnormality  CT angio Head and Neck with contrast(Personally reviewed): No LVO, string sign stenosis at left vertebral artery origin, occluded distal left V4 segment with plaque noted remaining patent, severe right vertebral artery origin stenosis, bilateral carotid atherosclerosis with 60% stenosis of proximal right ICA No core or penumbra seen on CTP  MRI Brain(Personally reviewed): Pending  Neurodiagnostics rEEG:  Pending  ASSESSMENT   Kenneth Bailey is a 58 y.o. male with hx of CAD status post CABG in 2023, CHF, COPD, peptic ulcer disease, PAD, diabetes, hyperlipidemia, upper GI bleed and smoking who was admitted with acute onset abnormal gait, jitteriness, decreased level of consciousness, dysarthria and generalized weakness.  Patient first noticed the symptoms on waking up this morning.  Initial CT head revealed no acute abnormality, but CT angiogram demonstrated severe stenosis of bilateral vertebral arteries.  CT perfusion demonstrated no core or penumbra infarct.  Severe posterior circulation stenosis does explain patient's decreased level of consciousness.  Would keep systolic blood pressure greater than 130 to ensure adequate perfusion and admit for stroke/TIA workup.  Will also obtain routine EEG.  RECOMMENDATIONS  Stroke/TIA Workup  - Admit for stroke workup - Given severe posterior  circulation stenosis, keep systolic blood pressure greater than 130 - MRI brain wo contrast - TTE w/bubble study - Check A1c and LDL but unable to add statin due to previous intolerance - Load with aspirin  325 mg then begin aspirin  81 mg daily and continue Plavix  75 mg daily antiplt/anticoag - q4 hr neuro checks - STAT head CT for any change in neuro exam - Tele - PT/OT/SLP - Stroke education - Amb referral to neurology upon discharge   ______________________________________________________________________  Patient seen by NP with MD, MD to edit note as needed  Signed, Kenneth E Everitt Clint Kill, NP [facility]    Attending Neurohospitalist Addendum Patient seen and examined with APP/Resident. Agree with the history and physical as documented above. Agree with the plan as documented, which I helped formulate. I have edited the note above to reflect my full findings and recommendations. I have independently reviewed the chart, obtained history, review of systems and examined the patient.I have personally reviewed pertinent head/neck/spine imaging (CT/MRI). Please feel free to call with any questions.  -- Kenneth Ross, MD [facility] [phone]  If 7pm- 7am, please page neurology on call as listed in AMION.

## 2023-12-08 NOTE — Progress Notes (Signed)
 Echocardiogram 2D Echocardiogram has been performed.  Kenneth Bailey 12/08/2023, 12:42 PM

## 2023-12-08 NOTE — ED Provider Notes (Signed)
 [name] EMERGENCY DEPARTMENT AT [facility] Provider Note   CSN: [id number] Arrival date & time: 12/08/23  9178  An emergency department physician performed an initial assessment on this suspected stroke patient at 0824.  Patient presents with: Code Stroke   Kenneth Bailey is a 58 y.o. male.   HPI    58 year old male presents today as code stroke.  Per EMS report.  Patient lives in [place].  They were called out to his house due to weakness.  Patient self reported he went to bed at midnight well.  He woke around 740 this morning.  He has been generally weak.  EMS reports that on their arrival he might have had some left facial droop.  They attempted to stand him but he could not walk.  They did not notice any other lateralized weakness.  His blood pressure was 177 systolic and blood sugar was adequate.  During transport, which lasted 40 minutes, patient became increasingly confused and less responsive.  They report that his left facial droop increased.  On arrival here, patient is able to answer some questions.  He denies any pain.  Speech is slurred.  No report of any blood thinners, illicit substances, or medications that would cause altered mental status at seen.  Review of medications in epic show no evidence of blood thinners or substances which could cause altered mental status  Prior to Admission medications   Medication Sig Start Date End Date Taking? Authorizing Provider  clopidogrel  (PLAVIX ) 75 MG tablet Take 1 tablet (75 mg total) by mouth daily. 05/14/23  Yes Carlin Delon BROCKS, NP  furosemide  (LASIX ) 20 MG tablet Take 20 mg by mouth daily as needed for fluid or edema.   Yes [provider]  icosapent  Ethyl (VASCEPA ) 1 g capsule Take 2 capsules (2 g total) by mouth 2 (two) times daily. 08/30/23  Yes Swinyer, Rosaline HERO, NP  LANTUS  SOLOSTAR 100 UNIT/ML Solostar Pen Inject 46-48 Units into the skin daily as needed (BS > 170).   Yes [provider]   metFORMIN (GLUCOPHAGE) 1000 MG tablet Take 1,000 mg by mouth 2 (two) times daily. 05/26/21  Yes [provider]  REPATHA  SURECLICK 140 MG/ML SOAJ Inject 140 mg into the skin See admin instructions. Inject 140mg  into the skin every other Monday. 10/11/23  Yes [provider]  traZODone (DESYREL) 50 MG tablet Take 100-150 mg by mouth at bedtime as needed for sleep. 10/03/23  Yes [provider]  Insulin  Syringe-Needle U-100 30G X 1/2 0.3 ML MISC Use with Novolog  11/17/22   Sherrill Cable Latif, DO    Allergies: Lisinopril and Statins    Review of Systems  Updated Vital Signs BP 127/79   Pulse 96   Temp (!) 97.4 F (36.3 C) (Rectal)   Resp 13   Wt 81 kg   SpO2 94%   BMI 27.14 kg/m   Physical Exam Vitals reviewed.  Constitutional:      General: He is not in acute distress.    Appearance: He is ill-appearing.  HENT:     Head: Normocephalic and atraumatic.     Right Ear: External ear normal.     Left Ear: External ear normal.     Nose: Nose normal.     Mouth/Throat:     Pharynx: Oropharynx is clear.  Eyes:     Extraocular Movements: Extraocular movements intact.     Pupils: Pupils are equal, round, and reactive to light.  Cardiovascular:  Rate and Rhythm: Normal rate.     Pulses: Normal pulses.     Heart sounds: Normal heart sounds.  Pulmonary:     Effort: Pulmonary effort is normal.  Abdominal:     Palpations: Abdomen is soft.  Musculoskeletal:        General: Normal range of motion.     Cervical back: Normal range of motion and neck supple.  Skin:    General: Skin is warm.     Capillary Refill: Capillary refill takes less than 2 seconds.  Neurological:     Mental Status: He is alert.     Comments: Patient has decreased strength in all 4 extremities. Finger pointing is normal bilaterally Diffuse decreased sensation upper extremities   Psychiatric:        Mood and Affect: Mood normal.        Behavior: Behavior normal.     (all labs  ordered are listed, but only abnormal results are displayed) Labs Reviewed  DIFFERENTIAL - Abnormal; Notable for the following components:      Result Value   Neutro Abs 8.3 (*)    All other components within normal limits  COMPREHENSIVE METABOLIC PANEL WITH GFR - Abnormal; Notable for the following components:   Glucose, Bld 191 (*)    BUN 24 (*)    Anion gap 16 (*)    All other components within normal limits  I-STAT CHEM 8, ED - Abnormal; Notable for the following components:   BUN 29 (*)    Glucose, Bld 186 (*)    Calcium , Ion 1.08 (*)    All other components within normal limits  CBG MONITORING, ED - Abnormal; Notable for the following components:   Glucose-Capillary 201 (*)    All other components within normal limits  I-STAT VENOUS BLOOD GAS, ED - Abnormal; Notable for the following components:   pH, Ven 7.446 (*)    pO2, Ven 69 (*)    Bicarbonate 32.3 (*)    TCO2 34 (*)    Acid-Base Excess 7.0 (*)    Calcium , Ion 1.07 (*)    All other components within normal limits  PROTIME-INR  APTT  CBC  ETHANOL  URINALYSIS, ROUTINE W REFLEX MICROSCOPIC  RAPID URINE DRUG SCREEN, HOSP PERFORMED    EKG: EKG Interpretation Date/Time:  Saturday December 08 2023 08:45:05 EDT Ventricular Rate:  87 PR Interval:  170 QRS Duration:  103 QT Interval:  372 QTC Calculation: 448 R Axis:   71  Text Interpretation: Sinus rhythm Left atrial enlargement Borderline T wave abnormalities No significant change since last tracing 02 April 2023 Confirmed by Levander Houston [phone]) on 12/08/2023 9:32:13 AM  Radiology: [facility] Chest Port 1 View Result Date: 12/08/2023 CLINICAL DATA:  Altered mental status. EXAM: PORTABLE CHEST 1 VIEW COMPARISON:  10/09/2023 FINDINGS: Status post median sternotomy and CABG procedure. Stable cardiomediastinal contours. Decreased lung volumes. Increased pulmonary vascular congestion with mild prominent peripheral septal markings in the lower lung zones. Subsegmental  atelectasis noted in the left base. No significant effusion. IMPRESSION: 1. Increased pulmonary vascular congestion and mild interstitial edema. 2. Left base subsegmental atelectasis. Electronically Signed   By: Waddell Calk M.D.   On: 12/08/2023 09:18   CT ANGIO HEAD NECK W WO CM W PERF (CODE STROKE) Result Date: 12/08/2023 CLINICAL DATA:  58 year old male code stroke presentation. EXAM: CT ANGIOGRAPHY HEAD AND NECK CT PERFUSION BRAIN TECHNIQUE: Multidetector CT imaging of the head and neck was performed using the standard protocol during bolus administration of  intravenous contrast. Multiplanar CT image reconstructions and MIPs were obtained to evaluate the vascular anatomy. Carotid stenosis measurements (when applicable) are obtained utilizing NASCET criteria, using the distal internal carotid diameter as the denominator. Multiphase CT imaging of the brain was performed following IV bolus contrast injection. Subsequent parametric perfusion maps were calculated using RAPID software. RADIATION DOSE REDUCTION: This exam was performed according to the departmental dose-optimization program which includes automated exposure control, adjustment of the mA and/or kV according to patient size and/or use of iterative reconstruction technique. CONTRAST:  OMNIPAQUE  IOHEXOL  350 MG/ML SOLN COMPARISON:  Plain head CT 0830 hours today. FINDINGS: CT Brain Perfusion Findings: ASPECTS: 10 CBF (<30%) Volume: None Perfusion (Tmax>6.0s) volume: None Mismatch Volume: Not applicable Infarction Location:Not applicable CTA NECK Skeleton: Prior sternotomy. Absent dentition. Cervical spine degeneration. No acute osseous abnormality identified. Upper chest: Prior CABG. Mild upper lung atelectasis, possible underlying emphysema. Other neck: Nonvascular neck soft tissue spaces are within normal limits. Aortic arch: 3 vessel arch. Prior CABG. Soft more so than calcified arch atherosclerosis. Right carotid system: Mild soft plaque of  the brachiocephalic and proximal right CCA without stenosis. Moderate soft and calcified plaque right ICA origin and bulb with 60 % stenosis with respect to the distal vessel. Left carotid system: Left CCA soft plaque without stenosis. Soft and calcified plaque of the proximal left ICA with less than 50 % stenosis with respect to the distal vessel. Vertebral arteries: Soft plaque right subclavian artery origin without stenosis. Soft plaque at the right vertebral artery origin with moderate to severe stenosis as seen on series 8, image 176. Right vertebral remains patent and is otherwise negative to the skull base. Proximal left subclavian soft plaque without stenosis. Severe stenosis left vertebral artery origin, radiographic string sign (series 5, image 286). Diminutive caliber of the left vertebral artery although it does remain patent to the skull base. Left vertebral appears mildly irregular throughout. CTA HEAD Posterior circulation: Distal left vertebral artery is nearly occluded at the left PICA origin, and likewise appears highly diminutive and functionally occluded just distal to the left PICA which remains patent. Right vertebral V4 segment appears somewhat dominant and is patent to the vertebrobasilar junction with mild irregularity. Right PICA remains patent. Basilar artery remains patent. No basilar stenosis. SCA and right PCA origins are normal. Fetal type left PCA origin. Bilateral PCA branches are within normal limits. Anterior circulation: Both ICA siphons are patent although atherosclerotic. Left siphon combined soft and calcified atherosclerosis with mild to moderate siphon stenosis perhaps most pronounced in the petrous segment on series 11, image 168. Contralateral right ICA siphon similar widespread atherosclerosis with both soft and calcified plaque. Only mild right siphon stenosis. Patent carotid termini. Normal left posterior communicating artery origin. Patent and normal MCA and ACA  origins. Normal anterior communicating artery. Bilateral ACA branches are within normal limits. Bilateral MCA M1 segments and branches are within normal limits. Venous sinuses: Patent. Venous mixing artifact within the right IJ (greater than 150 Hounsfield units). Anatomic variants: Fetal type left PCA origin. Affectively dominant right vertebral artery. Review of the MIP images confirms the above findings Preliminary of the above was discussed by telephone with Dr. ELIDA ROSS on 12/08/2023 at 08:57 . IMPRESSION: 1. Negative CT Perfusion. 2. CTA is negative for ELVO, but Positive for Severe bilateral Vertebral Artery stenoses: - string sign stenosis Left vertebral origin with diminutive left vert and functionally occluded distal left V4 segment (left PICA remains patent). - Severe Right vertebral origin stenosis. Otherwise  better right vertebral patency to the basilar. Basilar and remaining posterior circulation within normal limits. 3. Bilateral carotid atherosclerosis is Moderate. 60% stenosis proximal Right ICA. Up to moderate stenosis left ICA siphon. 4.  Aortic Atherosclerosis (ICD10-I70.0).  Prior CABG. Salient findings discussed by telephone with Dr. ELIDA ROSS on 12/08/2023 at 08:57 . Electronically Signed   By: VEAR Hurst M.D.   On: 12/08/2023 09:04   CT HEAD CODE STROKE WO CONTRAST Result Date: 12/08/2023 CLINICAL DATA:  Code stroke.  58 year old male. EXAM: CT HEAD WITHOUT CONTRAST TECHNIQUE: Contiguous axial images were obtained from the base of the skull through the vertex without intravenous contrast. RADIATION DOSE REDUCTION: This exam was performed according to the departmental dose-optimization program which includes automated exposure control, adjustment of the mA and/or kV according to patient size and/or use of iterative reconstruction technique. COMPARISON:  Head CT 01/21/2023. FINDINGS: Brain: Cerebral volume remains normal. No midline shift, ventriculomegaly, mass effect, evidence of mass  lesion, intracranial hemorrhage or evidence of cortically based acute infarction. Gray-white differentiation appears stable and within normal limits throughout the brain. Vascular: No suspicious intracranial vascular hyperdensity. Calcified atherosclerosis at the skull base. Skull: Intact.  No acute osseous abnormality identified. Sinuses/Orbits: Visualized paranasal sinuses and mastoids are stable and well aerated. Other: Leftward gaze. Visualized scalp soft tissues are within normal limits. ASPECTS [facility] Stroke Program Early CT Score) Total score (0-10 with 10 being normal): 10 IMPRESSION: Stable and normal for age noncontrast CT appearance of the brain. ASPECTS 10. These results were communicated to Dr. ROSS at 8:40 am on 12/08/2023 by text page via the [facility] messaging system. Electronically Signed   By: VEAR Hurst M.D.   On: 12/08/2023 08:40     .Critical Care  Performed by: Levander Houston, MD Authorized by: Levander Houston, MD   Critical care provider statement:    Critical care time (minutes):  30   Critical care end time:  12/08/2023 9:38 AM   Critical care time was exclusive of:  Separately billable procedures and treating other patients and teaching time   Critical care was necessary to treat or prevent imminent or life-threatening deterioration of the following conditions:  CNS failure or compromise   Critical care was time spent personally by me on the following activities:  Development of treatment plan with patient or surrogate, discussions with consultants, evaluation of patient's response to treatment, examination of patient, ordering and review of laboratory studies, ordering and review of radiographic studies, ordering and performing treatments and interventions, pulse oximetry, re-evaluation of patient's condition and review of old charts    Medications Ordered in the ED   stroke: early stages of recovery book (has no administration in time range)  aspirin  chewable tablet 81 mg (has  no administration in time range)  clopidogrel  (PLAVIX ) tablet 75 mg (75 mg Oral Given 12/08/23 0932)  lactated ringers  bolus 500 mL (has no administration in time range)  sodium chloride  flush (NS) 0.9 % injection 3 mL (3 mLs Intravenous Given 12/08/23 0858)  iohexol  (OMNIPAQUE ) 350 MG/ML injection 100 mL (100 mLs Intravenous Contrast Given 12/08/23 0842)  aspirin  chewable tablet 324 mg (324 mg Oral Given 12/08/23 0933)    Clinical Course as of 12/08/23 0938  Sat Dec 08, 2023  0917 Complete metabolic panel reviewed interpreted significant for glucose elevated at 191 Ionized calcium  decreased at 1.08 CBC reviewed interpreted and within normal limits VBG reviewed pH 7.44 PO269, PCO246 Alcohol less than 15 [DR]  0918 CT head stable and normal for  age noncontrast CT head [DR]  0918 CTA negative perfusion, negative ELVO, positive for severe bilateral vertebral artery stenosis [DR]    Clinical Course User Index [DR] Levander Houston, MD                                 Medical Decision Making Amount and/or Complexity of Data Reviewed Labs: ordered. Radiology: ordered.   58 yo male presented as code stroke with decreased responsiveness, no definite lateral symptoms, difficulty with walking. No history of substance abuse, hypoglycemia, blood thinners Evaluated with neurology. CTs head no definite acute ich, or other stroke, but does have severe bilateral vertebral artery stenosis. Labs with normal cbc, hyperglycemia, ow essentially normal. BP 127/79 now down from initial 170. HR and EKG normal Sats normal. Other etiologies considered- hypoglycemia, intoxication/toxins, infection, electrolyte abnormalities, trauma, hypoxia, hypercarbia, renal failure, liver failure, hyperglycemia, No laboratory or imaging to support above.  Plan admission for stroke/tia work up Patient seen with stroke team, Dr. Matthews please refer to neurology note Care discussed with Dr. Kristy on-call for internal medicine  who will see for admission     Final diagnoses:  Altered mental status, unspecified altered mental status type    ED Discharge Orders     None          Levander Houston, MD 12/08/23 [phone]

## 2023-12-08 NOTE — Hospital Course (Addendum)
 This AM lightheaded, diffuse weakness, was going to fall, very thirsty, lethargy. Nothing like this in the past. Very sleepy. Fell asleep 3x during conversation. Denies alcohol, marijuana, illicit drug use 1 episode of emesis. Denies Blood  April sister-- Emergency contact--daughter Lives alone  CHF s/p CABG DM Smoke 1pk/day x 3yrs  On disability Uses cane  Meds Metformin Insulin --40-50units Fish oil Plavix --takes once per week Lasix --takes when swelling.   PE MS 3+/5 finger grip 4/5 diffuse bilaterally 143/80 MAP 99  ROS -CP, SOB, Palp,  +vomitting this AM

## 2023-12-08 NOTE — ED Notes (Signed)
 IP unit notified.

## 2023-12-08 NOTE — ED Notes (Signed)
 Pt provided urinal to attempt collection.

## 2023-12-09 ENCOUNTER — Observation Stay (HOSPITAL_BASED_OUTPATIENT_CLINIC_OR_DEPARTMENT_OTHER)

## 2023-12-09 DIAGNOSIS — I82432 Acute embolism and thrombosis of left popliteal vein: Secondary | ICD-10-CM

## 2023-12-09 DIAGNOSIS — R531 Weakness: Secondary | ICD-10-CM | POA: Diagnosis not present

## 2023-12-09 DIAGNOSIS — F1290 Cannabis use, unspecified, uncomplicated: Secondary | ICD-10-CM | POA: Diagnosis not present

## 2023-12-09 DIAGNOSIS — R5383 Other fatigue: Secondary | ICD-10-CM

## 2023-12-09 DIAGNOSIS — M7989 Other specified soft tissue disorders: Secondary | ICD-10-CM

## 2023-12-09 DIAGNOSIS — I82442 Acute embolism and thrombosis of left tibial vein: Secondary | ICD-10-CM

## 2023-12-09 DIAGNOSIS — F17210 Nicotine dependence, cigarettes, uncomplicated: Secondary | ICD-10-CM

## 2023-12-09 DIAGNOSIS — I5020 Unspecified systolic (congestive) heart failure: Secondary | ICD-10-CM | POA: Diagnosis not present

## 2023-12-09 DIAGNOSIS — E785 Hyperlipidemia, unspecified: Secondary | ICD-10-CM

## 2023-12-09 DIAGNOSIS — G459 Transient cerebral ischemic attack, unspecified: Secondary | ICD-10-CM | POA: Diagnosis not present

## 2023-12-09 DIAGNOSIS — F1210 Cannabis abuse, uncomplicated: Secondary | ICD-10-CM

## 2023-12-09 DIAGNOSIS — Q2112 Patent foramen ovale: Secondary | ICD-10-CM | POA: Diagnosis not present

## 2023-12-09 DIAGNOSIS — I669 Occlusion and stenosis of unspecified cerebral artery: Secondary | ICD-10-CM | POA: Diagnosis not present

## 2023-12-09 DIAGNOSIS — I82452 Acute embolism and thrombosis of left peroneal vein: Secondary | ICD-10-CM

## 2023-12-09 LAB — GLUCOSE, CAPILLARY
Glucose-Capillary: 273 mg/dL — ABNORMAL HIGH (ref 70–99)
Glucose-Capillary: 255 mg/dL — ABNORMAL HIGH (ref 70–99)
Glucose-Capillary: 247 mg/dL — ABNORMAL HIGH (ref 70–99)
Glucose-Capillary: 174 mg/dL — ABNORMAL HIGH (ref 70–99)
Glucose-Capillary: 168 mg/dL — ABNORMAL HIGH (ref 70–99)

## 2023-12-09 LAB — CBC
HCT: 37.6 % — ABNORMAL LOW (ref 39.0–52.0)
Hemoglobin: 12.6 g/dL — ABNORMAL LOW (ref 13.0–17.0)
MCH: 28.3 pg (ref 26.0–34.0)
MCHC: 33.5 g/dL (ref 30.0–36.0)
MCV: 84.3 fL (ref 80.0–100.0)
Platelets: 198 K/uL (ref 150–400)
RBC: 4.46 MIL/uL (ref 4.22–5.81)
RDW: 13.6 % (ref 11.5–15.5)
WBC: 8.9 K/uL (ref 4.0–10.5)
nRBC: 0 % (ref 0.0–0.2)

## 2023-12-09 LAB — LDL CHOLESTEROL, DIRECT: Direct LDL: 42 mg/dL (ref 0–99)

## 2023-12-09 LAB — COMPREHENSIVE METABOLIC PANEL WITH GFR
ALT: 17 U/L (ref 0–44)
AST: 14 U/L — ABNORMAL LOW (ref 15–41)
Albumin: 3.1 g/dL — ABNORMAL LOW (ref 3.5–5.0)
Alkaline Phosphatase: 110 U/L (ref 38–126)
Anion gap: 10 (ref 5–15)
BUN: 27 mg/dL — ABNORMAL HIGH (ref 6–20)
CO2: 29 mmol/L (ref 22–32)
Calcium: 9.1 mg/dL (ref 8.9–10.3)
Chloride: 96 mmol/L — ABNORMAL LOW (ref 98–111)
Creatinine, Ser: 1.22 mg/dL (ref 0.61–1.24)
GFR, Estimated: >60 mL/min (ref >60)
Glucose, Bld: 213 mg/dL — ABNORMAL HIGH (ref 70–99)
Potassium: 3.9 mmol/L (ref 3.5–5.1)
Sodium: 135 mmol/L (ref 135–145)
Total Bilirubin: 0.3 mg/dL (ref 0.0–1.2)
Total Protein: 6 g/dL — ABNORMAL LOW (ref 6.5–8.1)

## 2023-12-09 LAB — LIPID PANEL
Cholesterol: 106 mg/dL (ref 0–200)
HDL: 20 mg/dL — ABNORMAL LOW (ref >40)
LDL Cholesterol: UNDETERMINED mg/dL (ref 0–99)
Total CHOL/HDL Ratio: 5.3 ratio
Triglycerides: 553 mg/dL — ABNORMAL HIGH (ref <150)
VLDL: UNDETERMINED mg/dL (ref 0–40)

## 2023-12-09 LAB — HEMOGLOBIN A1C
Hgb A1c MFr Bld: 7.9 % — ABNORMAL HIGH (ref 4.8–5.6)
Mean Plasma Glucose: 180.03 mg/dL

## 2023-12-09 LAB — MAGNESIUM: Magnesium: 1.6 mg/dL — ABNORMAL LOW (ref 1.7–2.4)

## 2023-12-09 MED ORDER — EZETIMIBE 10 MG PO TABS
10.0000 mg | ORAL_TABLET | Freq: Every day | ORAL | Status: DC
  Administered 2023-12-09 – 2023-12-10 (×2): 10 mg via ORAL
  Filled 2023-12-09 (×2): qty 1

## 2023-12-09 MED ORDER — POTASSIUM CHLORIDE CRYS ER 20 MEQ PO TBCR
20.0000 meq | EXTENDED_RELEASE_TABLET | Freq: Once | ORAL | Status: DC
  Filled 2023-12-09: qty 1

## 2023-12-09 MED ORDER — EZETIMIBE 10 MG PO TABS: 10.0000 mg | ORAL_TABLET | Freq: Every day | ORAL | 1 refills | Status: AC

## 2023-12-09 MED ORDER — CLOPIDOGREL BISULFATE 75 MG PO TABS: 75.0000 mg | ORAL_TABLET | Freq: Every day | ORAL | 0 refills | Status: AC

## 2023-12-09 MED ORDER — ASPIRIN 81 MG PO CHEW: 81.0000 mg | CHEWABLE_TABLET | Freq: Every day | ORAL | 0 refills | Status: DC

## 2023-12-09 NOTE — Progress Notes (Signed)
 Message to MD Schuyler Novak about Mr Pech refusing to allow tele box put back on. States he's being discharged today. She said okay and to let him know were waiting on US  of bilateral LE' then he'll DC home.

## 2023-12-09 NOTE — Discharge Instructions (Addendum)
 Thank you for allowing us  to be part of your care. You were hospitalized for a stroke workup. We treated you with Aspirin  and plavix   See the changes in your medications and management of your chronic conditions below:  *For your Stroke like symptoms -We have STARTED you on these following medications:  -Aspirin  81mg --take 1 tablet once per day. you will need to take this medication for 3 weeks. Then stop.  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 Please reach out to your primary care doctor if you experience new lower leg pain, shortness of breath, or chest pain.  * Diabetes: For now, change how much insulin  you use. Decrease the number of units you use to 30 units daily until you see your primary care doctor. -Please see your cardiologist (Dr. Monetta: [phone]) in 7 to 10 days  -During your stay, your urine drug screen was positive for THC, found in marijuana. We believe this caused many of your symptoms. It is always best to consider cessation of these drugs.   FOLLOW UP APPOINTMENTS: Please call your family doctor at [phone] to schedule a follow up appointment  Please make sure to take medications as prescribed and go to your cardiology appointments  Please call your PCP or our clinic if you have any questions or concerns, we may be able to help and keep you from a long and expensive emergency room wait. Our clinic and after hours phone number is [phone]. The best time to call is Monday through Friday 9 am to 4 pm but there is always someone available 24/7 if you have an emergency. If you need medication refills please notify your pharmacy one week in advance and they will send us  a request.   We are glad you  are feeling better,  Schuyler Novak, DO Hadassah Kristy Ahr, MD Internal Medicine Inpatient Teaching Service at [facility]

## 2023-12-09 NOTE — Evaluation (Signed)
 Physical Therapy Evaluation and Discharge Patient Details Name: Kenneth Bailey MRN: [id number] DOB: August 02, 1965 Today's Date: 12/09/2023  History of Present Illness  58 y.o. male admitted on 12/08/23 with slurred speech and L facial drooping. Pt out of TNK window. CT positive for severe bilat vertebral artery stenosis, MRI negative for acute abnormality. PMH significant for DMII, diabetic polyneuropathy, CABG, CHF, current smoker, Lt foot I&D with 5th toe resection 6/24.  Clinical Impression  Patient evaluated by Physical Therapy with no further acute PT needs identified. Pt appears to be close to his functional baseline. Ambulating 300 ft with no assistive device with mild dynamic unsteadiness. Able to perform high level balance challenges without gross loss of balance. Negotiated 5 steps with L railing. Recommended continued use of SPC for unlevel surfaces. Education reviewed regarding BEFAST stroke signs and symptoms. All education has been completed and the patient has no further questions. No follow-up Physical Therapy or equipment needs. PT is signing off. Thank you for this referral.       If plan is discharge home, recommend the following: Assist for transportation   Can travel by private vehicle        Equipment Recommendations None recommended by PT  Recommendations for Other Services       Functional Status Assessment Patient has had a recent decline in their functional status and demonstrates the ability to make significant improvements in function in a reasonable and predictable amount of time.     Precautions / Restrictions Precautions Precautions: Fall Restrictions Weight Bearing Restrictions Per Provider Order: No      Mobility  Bed Mobility Overal bed mobility: Independent                  Transfers Overall transfer level: Independent Equipment used: None                    Ambulation/Gait Ambulation/Gait assistance: Supervision Gait  Distance (Feet): 300 Feet Assistive device: None Gait Pattern/deviations: Step-through pattern, Decreased stride length Gait velocity: decreased     General Gait Details: Mild dynamic unsteadiness, able to step over/around obstacles, perform head turns and stops and starts without gross LOB.  Stairs Stairs: Yes Stairs assistance: Modified independent (Device/Increase time) Stair Management: One rail Left Number of Stairs: 5    Wheelchair Mobility     Tilt Bed    Modified Rankin (Stroke Patients Only) Modified Rankin (Stroke Patients Only) Pre-Morbid Rankin Score: No significant disability Modified Rankin: No significant disability     Balance Overall balance assessment: Needs assistance Sitting-balance support: No upper extremity supported, Feet supported Sitting balance-Leahy Scale: Good     Standing balance support: No upper extremity supported, During functional activity Standing balance-Leahy Scale: Good                               Pertinent Vitals/Pain Pain Assessment Pain Assessment: No/denies pain    Home Living Family/patient expects to be discharged to:: Private residence Living Arrangements: Alone Available Help at Discharge: Family;Friend(s);Available PRN/intermittently Type of Home: Mobile home Home Access: Stairs to enter Entrance Stairs-Rails: Left Entrance Stairs-Number of Steps: 13   Home Layout: One level Home Equipment: Cane - single point      Prior Function Prior Level of Function : Independent/Modified Independent;Driving             Mobility Comments: no AD ADLs Comments: ind     Extremity/Trunk Assessment   Upper Extremity  Assessment Upper Extremity Assessment: Defer to OT evaluation    Lower Extremity Assessment Lower Extremity Assessment: Overall WFL for tasks assessed    Cervical / Trunk Assessment Cervical / Trunk Assessment: Normal  Communication   Communication Communication: No apparent  difficulties    Cognition Arousal: Alert Behavior During Therapy: Flat affect   PT - Cognitive impairments: Memory                         Following commands: Intact       Cueing Cueing Techniques: Verbal cues     General Comments      Exercises     Assessment/Plan    PT Assessment Patient does not need any further PT services  PT Problem List         PT Treatment Interventions      PT Goals (Current goals can be found in the Care Plan section)  Acute Rehab PT Goals Patient Stated Goal: did not state PT Goal Formulation: With patient Time For Goal Achievement: 12/23/23 Potential to Achieve Goals: Good    Frequency       Co-evaluation               AM-PAC PT 6 Clicks Mobility  Outcome Measure Help needed turning from your back to your side while in a flat bed without using bedrails?: None Help needed moving from lying on your back to sitting on the side of a flat bed without using bedrails?: None Help needed moving to and from a bed to a chair (including a wheelchair)?: None Help needed standing up from a chair using your arms (e.g., wheelchair or bedside chair)?: None Help needed to walk in hospital room?: A Little Help needed climbing 3-5 steps with a railing? : None 6 Click Score: 23    End of Session Equipment Utilized During Treatment: Gait belt Activity Tolerance: Patient tolerated treatment well Patient left: in bed;with call bell/phone within reach;with bed alarm set   PT Visit Diagnosis: Unsteadiness on feet (R26.81)    Time: 9185-9172 PT Time Calculation (min) (ACUTE ONLY): 13 min   Charges:   PT Evaluation $PT Eval Low Complexity: 1 Low   PT General Charges $$ ACUTE PT VISIT: 1 Visit         Aleck Daring, PT, DPT Acute Rehabilitation Services Office [phone]   Alayne ONEIDA Daring 12/09/2023, 9:05 AM

## 2023-12-09 NOTE — Discharge Summary (Shared)
 Name: Kenneth Bailey MRN: [id number] DOB: 02-06-1966 58 y.o. PCP: Lonna Leonor HERO, NP  Date of Admission: 12/08/2023  8:21 AM Date of Discharge: 12/10/2023 Attending Physician: Dr. Dayton Eastern  Discharge Diagnosis: 1. Principal Problem:   Chronic systolic CHF (congestive heart failure) (HCC) Active Problems:   Hx of CABG   Tobacco use   Diabetes mellitus, type 2 (HCC)   COPD (chronic obstructive pulmonary disease) (HCC)   Postural hypotension   CKD (chronic kidney disease)   Marijuana use   HFrEF (heart failure with reduced ejection fraction) (HCC)    Discharge Medications: Allergies as of 12/10/2023       Reactions   Neurontin  [gabapentin ] Other (See Comments)   Concern for causing Alzheimer's/dementia    Statins Swelling   Leg swelling   Zestril [lisinopril] Swelling   Leg swelling        Medication List     PAUSE taking these medications    furosemide  20 MG tablet Wait to take this until your doctor or other care provider tells you to start again. Commonly known as: LASIX  Take 20 mg by mouth daily as needed for fluid or edema.       TAKE these medications    acetaminophen  500 MG tablet Commonly known as: TYLENOL  Take 1,000 mg by mouth 2 (two) times daily as needed for moderate pain (pain score 4-6), fever or headache.   aspirin  81 MG chewable tablet Chew 1 tablet (81 mg total) by mouth daily for 18 days.   clopidogrel  75 MG tablet Commonly known as: PLAVIX  Take 1 tablet (75 mg total) by mouth daily.   ezetimibe  10 MG tablet Commonly known as: ZETIA  Take 1 tablet (10 mg total) by mouth daily.   ibuprofen 200 MG tablet Commonly known as: ADVIL Take 400 mg by mouth 2 (two) times daily as needed for headache or moderate pain (pain score 4-6).   icosapent  Ethyl 1 g capsule Commonly known as: Vascepa  Take 2 capsules (2 g total) by mouth 2 (two) times daily.   Lantus  SoloStar 100 UNIT/ML Solostar Pen Generic drug: insulin  glargine Inject  30 Units into the skin daily as needed (BS > 170). What changed: how much to take   metFORMIN 1000 MG tablet Commonly known as: GLUCOPHAGE Take 1,000 mg by mouth 2 (two) times daily.   omeprazole 40 MG capsule Commonly known as: PRILOSEC Take 40 mg by mouth at bedtime as needed (heartburn).   Repatha  SureClick 140 MG/ML Soaj Generic drug: Evolocumab  Inject 140 mg into the skin See admin instructions. Inject 140mg  into the skin every other Monday.   traZODone 50 MG tablet Commonly known as: DESYREL Take 100-150 mg by mouth at bedtime as needed for sleep.   UltiCare Insulin  Syringe 30G X 1/2 0.3 ML Misc Generic drug: Insulin  Syringe-Needle U-100 Use with Novolog         Disposition and follow-up:   Kenneth Bailey was discharged from [facility] in Good condition.  At the hospital follow up visit please address:  Neurologic symptoms--Ensure no new symptoms have developed or occurred. DAPT for 3 weeks, then Plavix  monotherapy HTN--per neurolgy, no SBP goal, but pt to avoid hypotension Cardiology follow up--ensure pt is following with cardiology  Hypertriglyceridemia -- Continued on home medications DM--Will need adjustment in insulin  regimen  3.  Pending labs/ test needing follow-up: Vascular U/S LE  Follow-up Appointments:  Follow-up Information     Lonna Leonor HERO, NP Follow up in 2 week(s).  Specialty: Nurse Practitioner Why: Hospital follow up Contact information: [address] [place] [address] [phone]                  Hospital Course by problem list: Kenneth Bailey is a 58 y.o. person living with a history of HFrEF, CAD s/p CABG, COPD, T2DM, postural hypotension, and HLD who presented with dizzines, loss of balance, and dysarthria and admitted for a stroke work up  now being discharged on hospital day 0 with the following pertinent hospital course:  TIA vs symptomatic severe vertebral artery stenosis The pt  presented to [facility] after experiencing dizziness, fatigue, and decreased balance at home with concern for acute CVA. A CT was performed which showed no acute intracranial abnormality. CTA showed severe vertebral artery stenosis bilaterally. MRI was negative for acute abnormality. On initial exam, pt was lethargic, frequently falling asleep mid conversation and had difficulty maintaining attention to conversation. Dysarthria was also noted. His initial neuro exam showed 3+/5 strength in finger flexion and 4/5 diffusely throughout the upper and lower limbs. He denied any alcohol or illicit drug use. A UDS was obtained ans was positive for THC. The patient improved throughout the day with increased attention, resolution of dysarthria, and increased appetite. ON 7/20, he was seen by the primary team and his neuro deficits had improved. Neuro exam was normal. Neurology was consulted and advised DAPT for 3 weeks due to inability to rule out TIA, he then should continue Plavix  indefinitely.   Chronic HFrEF The patient has long standing history of HFrEF and follows with outpatient cardiology. He is not on GDMT due to intolerance of the medications in the past. Echo performed 7/19 showed EF 30-35% and intra-atrial shunt. This EF is reduced from Echo's past. He was not in an acute exacerbation and it was not deemed necessary for an acute cardiology consult. He was educated to follow up closely with cardiology and to attempt GDMT therapy with newer medications. A DVT study was performed due to shunt being seen on echo. However pt reports he has known about this shunt for decades. On 7/20 the patient was deemed safe for discharge.   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  Given chronicity of findings, will not treat with anticoagulation at this time.  Diabetes mellitus, type 2 Decreased home Lantus  to 30 given  reduced insulin  needs during hospitalization  Stable chronic medical conditions: CAD COPD HLD  Subjective Feeling well this morning. No chest pain, shortness of breath, lower extremity pain. Endorses strong appetite. Has not experienced similar symptoms since admission. Feels comfortable discharging home today and picking up medications at preferred pharmacy. Has a ride to go home.  Discharge Exam:   BP 123/72 (BP Location: Left Arm)   Pulse 66   Temp 98 F (36.7 C)   Resp 16   Ht 5' 8 (1.727 m)   Wt 82.3 kg   SpO2 100%   BMI 27.59 kg/m  Discharge exam:  Const: Awake, alert HENT: Normocephalic, atraumatic, mucus membranes moist Card: RRR, No pitting edema on LE's bilaterally  Resp: LCTAB, no increased work of breathing Abd: Soft, NTND, Extremities: Warm, pink Neuro: Alert and proper thought content. Symmetrical face, muscle strength 5/5 in all major muscle groups  Pertinent Labs, Studies, and Procedures:     Latest Ref Rng & Units 12/10/2023    9:22 AM 12/09/2023    4:24  AM 12/08/2023   11:47 AM  CBC  WBC 4.0 - 10.5 K/uL 8.1  8.9    Hemoglobin 13.0 - 17.0 g/dL 86.3  87.3  86.3   Hematocrit 39.0 - 52.0 % 41.2  37.6  40.0   Platelets 150 - 400 K/uL 204  198         Latest Ref Rng & Units 12/09/2023    4:24 AM 12/08/2023   11:47 AM 12/08/2023    9:02 AM  CMP  Glucose 70 - 99 mg/dL 786     BUN 6 - 20 mg/dL 27     Creatinine 9.38 - 1.24 mg/dL 8.77     Sodium 864 - 854 mmol/L 135  140  141   Potassium 3.5 - 5.1 mmol/L 3.9  4.6  3.9   Chloride 98 - 111 mmol/L 96     CO2 22 - 32 mmol/L 29     Calcium  8.9 - 10.3 mg/dL 9.1     Total Protein 6.5 - 8.1 g/dL 6.0     Total Bilirubin 0.0 - 1.2 mg/dL 0.3     Alkaline Phos 38 - 126 U/L 110     AST 15 - 41 U/L 14     ALT 0 - 44 U/L 17       MR BRAIN WO CONTRAST Result Date: 12/08/2023 EXAM: MRI BRAIN WITHOUT CONTRAST 12/08/2023 06:18:15 PM TECHNIQUE: Multiplanar multisequence MRI of the head/brain was performed without the  administration of intravenous contrast. COMPARISON: None available. CLINICAL HISTORY: Stroke, follow up. FINDINGS: BRAIN AND VENTRICLES: No acute infarct. No intracranial hemorrhage. No mass. No midline shift. No hydrocephalus. The sella is unremarkable. Normal flow voids. Minimal nonspecific white matter T2-weighted signal hyperintensities, which may be associated with early chronic small vessel disease or migraine headaches. ORBITS: No acute abnormality. SINUSES AND MASTOIDS: No acute abnormality. BONES AND SOFT TISSUES: Normal marrow signal. No acute soft tissue abnormality. IMPRESSION: 1. No acute intracranial abnormality. 2. Minimal nonspecific white matter T2-weighted signal hyperintensities, possibly related to early chronic small vessel disease or migraine headaches. Electronically signed by: Franky Stanford MD 12/08/2023 08:47 PM EDT RP Workstation: [id number]   ECHOCARDIOGRAM COMPLETE BUBBLE STUDY Result Date: 12/08/2023    ECHOCARDIOGRAM REPORT   Patient Name:   Kenneth Bailey [facility] Date of Exam: 12/08/2023 Medical Rec #:  [id number]           Height:       68.0 in Accession #:    [id number]          Weight:       178.6 lb Date of Birth:  [birth year]            BSA:          1.948 m Patient Age:    58 years            BP:           148/89 mmHg Patient Gender: M                   HR:           77 bpm. Exam Location:  Inpatient Procedure: 2D Echo, Cardiac Doppler, Color Doppler and Saline Contrast Bubble            Study (Both Spectral and Color Flow Doppler were utilized during            procedure). Indications:    TIA 435.9 / G45.9  History:  Patient has prior history of Echocardiogram examinations, most                 recent 05/08/2023. CHF, Prior CABG, TIA, COPD and CKD,                 Signs/Symptoms:Hypotension and Syncope; Risk                 Factors:Hypertension, Diabetes, Dyslipidemia and Current Smoker.  Sonographer:    Thea Norlander RCS Referring Phys: [id number] JULIE MACHEN IMPRESSIONS  1.  No left ventricular thrombus is seen, but Definity contrast was not used. Left ventricular ejection fraction, by estimation, is 30 to 35%. The left ventricle has moderately decreased function. The left ventricle demonstrates global hypokinesis. The left ventricular internal cavity size was mildly dilated. Left ventricular diastolic parameters are consistent with Grade I diastolic dysfunction (impaired relaxation).  2. Right ventricular systolic function is normal. The right ventricular size is normal. Tricuspid regurgitation signal is inadequate for assessing PA pressure.  3. Left atrial size was mildly dilated.  4. The mitral valve is normal in structure. Trivial mitral valve regurgitation. No evidence of mitral stenosis.  5. The aortic valve is tricuspid. There is mild calcification of the aortic valve. There is mild thickening of the aortic valve. Aortic valve regurgitation is mild. Aortic valve sclerosis/calcification is present, without any evidence of aortic stenosis.  6. The inferior vena cava is normal in size with greater than 50% respiratory variability, suggesting right atrial pressure of 3 mmHg.  7. Agitated saline contrast bubble study was positive with shunting observed within 3-6 cardiac cycles suggestive of interatrial shunt. Comparison(s): Prior images reviewed side by side. The left ventricular function is significantly worse. (but note that LV systolic function was overestimated on the 05/08/2023 study; on current review LVEF was approximately 40-45% with global LV hypokinesis on that study). FINDINGS  Left Ventricle: No left ventricular thrombus is seen, but Definity contrast was not used. Left ventricular ejection fraction, by estimation, is 30 to 35%. The left ventricle has moderately decreased function. The left ventricle demonstrates global hypokinesis. The left ventricular internal cavity size was mildly dilated. There is no left ventricular hypertrophy. Abnormal (paradoxical) septal motion  consistent with post-operative status. Left ventricular diastolic parameters are consistent with Grade I diastolic dysfunction (impaired relaxation). Normal left ventricular filling pressure. Right Ventricle: The right ventricular size is normal. No increase in right ventricular wall thickness. Right ventricular systolic function is normal. Tricuspid regurgitation signal is inadequate for assessing PA pressure. Left Atrium: Left atrial size was mildly dilated. Right Atrium: Right atrial size was normal in size. Pericardium: There is no evidence of pericardial effusion. Mitral Valve: The mitral valve is normal in structure. Trivial mitral valve regurgitation, with centrally-directed jet. No evidence of mitral valve stenosis. Tricuspid Valve: The tricuspid valve is normal in structure. Tricuspid valve regurgitation is not demonstrated. Aortic Valve: The aortic valve is tricuspid. There is mild calcification of the aortic valve. There is mild thickening of the aortic valve. Aortic valve regurgitation is mild. Aortic valve sclerosis/calcification is present, without any evidence of aortic stenosis. Aortic valve peak gradient measures 9.5 mmHg. Pulmonic Valve: The pulmonic valve was normal in structure. Pulmonic valve regurgitation is not visualized. No evidence of pulmonic stenosis. Aorta: The aortic root and ascending aorta are structurally normal, with no evidence of dilitation. Venous: The inferior vena cava is normal in size with greater than 50% respiratory variability, suggesting right atrial pressure of 3 mmHg. IAS/Shunts: No atrial level  shunt detected by color flow Doppler. Agitated saline contrast was given intravenously to evaluate for intracardiac shunting. Agitated saline contrast bubble study was positive with shunting observed within 3-6 cardiac cycles suggestive of interatrial shunt.  LEFT VENTRICLE PLAX 2D LVIDd:         5.60 cm      Diastology LVIDs:         4.80 cm      LV e' medial:    5.98 cm/s LV  PW:         1.20 cm      LV E/e' medial:  11.8 LV IVS:        0.70 cm      LV e' lateral:   8.92 cm/s LVOT diam:     2.00 cm      LV E/e' lateral: 7.9 LV SV:         65 LV SV Index:   34 LVOT Area:     3.14 cm  LV Volumes (MOD) LV vol d, MOD A2C: 83.8 ml LV vol d, MOD A4C: 155.0 ml LV vol s, MOD A2C: 59.4 ml LV vol s, MOD A4C: 115.0 ml LV SV MOD A2C:     24.4 ml LV SV MOD A4C:     155.0 ml LV SV MOD BP:      32.9 ml RIGHT VENTRICLE            IVC RV S prime:     9.03 cm/s  IVC diam: 2.00 cm TAPSE (M-mode): 1.3 cm LEFT ATRIUM             Index        RIGHT ATRIUM          Index LA diam:        4.40 cm 2.26 cm/m   RA Area:     9.85 cm LA Vol (A2C):   44.9 ml 23.05 ml/m  RA Volume:   17.90 ml 9.19 ml/m LA Vol (A4C):   43.7 ml 22.44 ml/m LA Biplane Vol: 44.7 ml 22.95 ml/m  AORTIC VALVE AV Area (Vmax): 2.33 cm AV Vmax:        154.00 cm/s AV Peak Grad:   9.5 mmHg LVOT Vmax:      114.00 cm/s LVOT Vmean:     75.200 cm/s LVOT VTI:       0.208 m  AORTA Ao Root diam: 3.20 cm Ao Asc diam:  3.20 cm MITRAL VALVE MV Area (PHT): 5.97 cm     SHUNTS MV Decel Time: 127 msec     Systemic VTI:  0.21 m MV E velocity: 70.70 cm/s   Systemic Diam: 2.00 cm MV A velocity: 119.00 cm/s MV E/A ratio:  0.59 Mihai Croitoru MD Electronically signed by Jerel Balding MD Signature Date/Time: 12/08/2023/1:41:44 PM    Final    EEG adult Result Date: 12/08/2023 Shelton Arlin KIDD, MD     12/08/2023 12:56 PM Patient Name: Kenneth Bailey MRN: [id number] Epilepsy Attending: Arlin KIDD Shelton Referring Physician/Provider: everitt Clint Abbey Earle FORBES, NP Date: 12/08/2023 Duration: 27.21 mins Patient history: 58 yo male presented as code stroke with decreased responsiveness. EEG to evaluate for seizure Level of alertness: Awake, asleep AEDs during EEG study: None Technical aspects: This EEG study was done with scalp electrodes positioned according to the 10-20 International system of electrode placement. Electrical activity was reviewed with band pass  filter of 1-70Hz , sensitivity of 7 uV/mm, display speed of 93mm/sec with a 60Hz  notched filter  applied as appropriate. EEG data were recorded continuously and digitally stored.  Video monitoring was available and reviewed as appropriate. Description: The posterior dominant rhythm consists of 8-9 Hz activity of moderate voltage (25-35 uV) seen predominantly in posterior head regions, symmetric and reactive to eye opening and eye closing. Sleep was characterized by vertex waves, sleep spindles (12 to 14 Hz), maximal frontocentral region. Hyperventilation and photic stimulation were not performed.   IMPRESSION: This study is within normal limits. No seizures or epileptiform discharges were seen throughout the recording. A normal interictal EEG does not exclude the diagnosis of epilepsy. Arlin MALVA Krebs   DG Chest Port 1 View Result Date: 12/08/2023 CLINICAL DATA:  Altered mental status. EXAM: PORTABLE CHEST 1 VIEW COMPARISON:  10/09/2023 FINDINGS: Status post median sternotomy and CABG procedure. Stable cardiomediastinal contours. Decreased lung volumes. Increased pulmonary vascular congestion with mild prominent peripheral septal markings in the lower lung zones. Subsegmental atelectasis noted in the left base. No significant effusion. IMPRESSION: 1. Increased pulmonary vascular congestion and mild interstitial edema. 2. Left base subsegmental atelectasis. Electronically Signed   By: Waddell Calk M.D.   On: 12/08/2023 09:18   CT ANGIO HEAD NECK W WO CM W PERF (CODE STROKE) Result Date: 12/08/2023 CLINICAL DATA:  58 year old male code stroke presentation. EXAM: CT ANGIOGRAPHY HEAD AND NECK CT PERFUSION BRAIN TECHNIQUE: Multidetector CT imaging of the head and neck was performed using the standard protocol during bolus administration of intravenous contrast. Multiplanar CT image reconstructions and MIPs were obtained to evaluate the vascular anatomy. Carotid stenosis measurements (when applicable) are obtained  utilizing NASCET criteria, using the distal internal carotid diameter as the denominator. Multiphase CT imaging of the brain was performed following IV bolus contrast injection. Subsequent parametric perfusion maps were calculated using RAPID software. RADIATION DOSE REDUCTION: This exam was performed according to the departmental dose-optimization program which includes automated exposure control, adjustment of the mA and/or kV according to patient size and/or use of iterative reconstruction technique. CONTRAST:  OMNIPAQUE  IOHEXOL  350 MG/ML SOLN COMPARISON:  Plain head CT 0830 hours today. FINDINGS: CT Brain Perfusion Findings: ASPECTS: 10 CBF (<30%) Volume: None Perfusion (Tmax>6.0s) volume: None Mismatch Volume: Not applicable Infarction Location:Not applicable CTA NECK Skeleton: Prior sternotomy. Absent dentition. Cervical spine degeneration. No acute osseous abnormality identified. Upper chest: Prior CABG. Mild upper lung atelectasis, possible underlying emphysema. Other neck: Nonvascular neck soft tissue spaces are within normal limits. Aortic arch: 3 vessel arch. Prior CABG. Soft more so than calcified arch atherosclerosis. Right carotid system: Mild soft plaque of the brachiocephalic and proximal right CCA without stenosis. Moderate soft and calcified plaque right ICA origin and bulb with 60 % stenosis with respect to the distal vessel. Left carotid system: Left CCA soft plaque without stenosis. Soft and calcified plaque of the proximal left ICA with less than 50 % stenosis with respect to the distal vessel. Vertebral arteries: Soft plaque right subclavian artery origin without stenosis. Soft plaque at the right vertebral artery origin with moderate to severe stenosis as seen on series 8, image 176. Right vertebral remains patent and is otherwise negative to the skull base. Proximal left subclavian soft plaque without stenosis. Severe stenosis left vertebral artery origin, radiographic string sign  (series 5, image 286). Diminutive caliber of the left vertebral artery although it does remain patent to the skull base. Left vertebral appears mildly irregular throughout. CTA HEAD Posterior circulation: Distal left vertebral artery is nearly occluded at the left PICA origin, and likewise appears highly diminutive  and functionally occluded just distal to the left PICA which remains patent. Right vertebral V4 segment appears somewhat dominant and is patent to the vertebrobasilar junction with mild irregularity. Right PICA remains patent. Basilar artery remains patent. No basilar stenosis. SCA and right PCA origins are normal. Fetal type left PCA origin. Bilateral PCA branches are within normal limits. Anterior circulation: Both ICA siphons are patent although atherosclerotic. Left siphon combined soft and calcified atherosclerosis with mild to moderate siphon stenosis perhaps most pronounced in the petrous segment on series 11, image 168. Contralateral right ICA siphon similar widespread atherosclerosis with both soft and calcified plaque. Only mild right siphon stenosis. Patent carotid termini. Normal left posterior communicating artery origin. Patent and normal MCA and ACA origins. Normal anterior communicating artery. Bilateral ACA branches are within normal limits. Bilateral MCA M1 segments and branches are within normal limits. Venous sinuses: Patent. Venous mixing artifact within the right IJ (greater than 150 Hounsfield units). Anatomic variants: Fetal type left PCA origin. Affectively dominant right vertebral artery. Review of the MIP images confirms the above findings Preliminary of the above was discussed by telephone with Dr. ELIDA ROSS on 12/08/2023 at 08:57 . IMPRESSION: 1. Negative CT Perfusion. 2. CTA is negative for ELVO, but Positive for Severe bilateral Vertebral Artery stenoses: - string sign stenosis Left vertebral origin with diminutive left vert and functionally occluded distal left V4  segment (left PICA remains patent). - Severe Right vertebral origin stenosis. Otherwise better right vertebral patency to the basilar. Basilar and remaining posterior circulation within normal limits. 3. Bilateral carotid atherosclerosis is Moderate. 60% stenosis proximal Right ICA. Up to moderate stenosis left ICA siphon. 4.  Aortic Atherosclerosis (ICD10-I70.0).  Prior CABG. Salient findings discussed by telephone with Dr. ELIDA ROSS on 12/08/2023 at 08:57 . Electronically Signed   By: VEAR Hurst M.D.   On: 12/08/2023 09:04   CT HEAD CODE STROKE WO CONTRAST Result Date: 12/08/2023 CLINICAL DATA:  Code stroke.  58 year old male. EXAM: CT HEAD WITHOUT CONTRAST TECHNIQUE: Contiguous axial images were obtained from the base of the skull through the vertex without intravenous contrast. RADIATION DOSE REDUCTION: This exam was performed according to the departmental dose-optimization program which includes automated exposure control, adjustment of the mA and/or kV according to patient size and/or use of iterative reconstruction technique. COMPARISON:  Head CT 01/21/2023. FINDINGS: Brain: Cerebral volume remains normal. No midline shift, ventriculomegaly, mass effect, evidence of mass lesion, intracranial hemorrhage or evidence of cortically based acute infarction. Gray-white differentiation appears stable and within normal limits throughout the brain. Vascular: No suspicious intracranial vascular hyperdensity. Calcified atherosclerosis at the skull base. Skull: Intact.  No acute osseous abnormality identified. Sinuses/Orbits: Visualized paranasal sinuses and mastoids are stable and well aerated. Other: Leftward gaze. Visualized scalp soft tissues are within normal limits. ASPECTS [facility] Stroke Program Early CT Score) Total score (0-10 with 10 being normal): 10 IMPRESSION: Stable and normal for age noncontrast CT appearance of the brain. ASPECTS 10. These results were communicated to Dr. ROSS at 8:40 am on 12/08/2023  by text page via the [facility] messaging system. Electronically Signed   By: VEAR Hurst M.D.   On: 12/08/2023 08:40     Discharge Instructions: Discharge Instructions     (HEART FAILURE PATIENTS) Call MD:  Anytime you have any of the following symptoms: 1) 3 pound weight gain in 24 hours or 5 pounds in 1 week 2) shortness of breath, with or without a dry hacking cough 3) swelling in the hands, feet or  stomach 4) if you have to sleep on extra pillows at night in order to breathe.   Complete by: As directed    (HEART FAILURE PATIENTS) Call MD:  Anytime you have any of the following symptoms: 1) 3 pound weight gain in 24 hours or 5 pounds in 1 week 2) shortness of breath, with or without a dry hacking cough 3) swelling in the hands, feet or stomach 4) if you have to sleep on extra pillows at night in order to breathe.   Complete by: As directed    Call MD for:  difficulty breathing, headache or visual disturbances   Complete by: As directed    Call MD for:  extreme fatigue   Complete by: As directed    Call MD for:  persistant dizziness or light-headedness   Complete by: As directed    Call MD for:  persistant dizziness or light-headedness   Complete by: As directed    Call MD for:  severe uncontrolled pain   Complete by: As directed    Call MD for:  severe uncontrolled pain   Complete by: As directed    Diet - low sodium heart healthy   Complete by: As directed    Discharge instructions   Complete by: As directed    Thank you for allowing us  to be part of your care. You were hospitalized for a stroke workup. We treated you with Aspirin  and plavix   See the changes in your medications and management of your chronic conditions below:  *For your Stroke like symptoms -We have STARTED you on these following medications:  -Aspirin  81mg --take 1 tablet once per day. you will need to take this medication for 3 weeks. Then stop.  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 Please reach out to your primary care doctor if you experience new lower leg pain, shortness of breath, or chest pain.  * Diabetes: For now, change how much insulin  you use. Decrease the number of units you use to 30 units daily until you see your primary care doctor. -Please see your cardiologist (Dr. Monetta: [phone]) in 7 to 10 days  -During your stay, your urine drug screen was positive for THC, found in marijuana. We believe this caused many of your symptoms. It is always best to consider cessation of these drugs.   FOLLOW UP APPOINTMENTS: Please call your family doctor at [phone] to schedule a follow up appointment  Please make sure to take medications as prescribed and go to your cardiology appointments  Please call your PCP or our clinic if you have any questions or concerns, we may be able to help and keep you from a long and expensive emergency room wait. Our clinic and after hours phone number is [phone]. The best time to call is Monday through Friday 9 am to 4 pm but there is always someone available 24/7 if you have an emergency. If you need medication refills please notify your pharmacy one week in advance and they will send us  a request.   We are glad you are feeling better,  Schuyler Novak, DO Hadassah Kristy Ahr, MD Internal Medicine Inpatient Teaching Service at [facility]   Increase activity slowly   Complete by: As directed  Increase activity slowly   Complete by: As directed        Signed: Schuyler Novak, DO Elnora Ip, MD 12/10/2023, 12:34 PM

## 2023-12-09 NOTE — Progress Notes (Signed)
 HD#0 SUBJECTIVE:  Patient Summary: Kenneth Bailey is a 58 y.o. with a pertinent PMH of CHF, CAD s/p CABG, COPD, T2DM, and postural hypotension, and HLD, who presented with lethargy and decreased balance and admitted for stroke work up.   Overnight Events: No acute events overnight  Interim History: Pt seen and examined at the bedside this morning. He reports feeling much better than yesterday with increased strength. Discussed UDS results with him and negative stroke imaging. Encouraged close follow up with cardiology due to worsening HF. All questions and concerns were addressed at this time.   OBJECTIVE:  Vital Signs: Vitals:   12/09/23 0043 12/09/23 0400 12/09/23 0530 12/09/23 0933  BP: 120/68 125/71  133/79  Pulse: 71 70  80  Resp: 17 18  18   Temp: 98.3 F (36.8 C) 98.1 F (36.7 C)  (!) 97.5 F (36.4 C)  TempSrc: Oral Oral  Oral  SpO2: 97% 97%  99%  Weight:   81.6 kg   Height:       Supplemental O2: Room Air SpO2: 99 %  Filed Weights   12/08/23 0800 12/08/23 0948 12/09/23 0530  Weight: 81 kg 81 kg 81.6 kg     Intake/Output Summary (Last 24 hours) at 12/09/2023 1129 Last data filed at 12/09/2023 0600 Gross per 24 hour  Intake 400 ml  Output 500 ml  Net -100 ml   Net IO Since Admission: 400.14 mL [12/09/23 1129]  Physical Exam: Physical Exam Const: Awake, alert in NAD HENT: Normocephalic, atraumatic, mucus membranes moist Card: RRR, No pitting edema on LE's bilaterally  Resp: LCTAB, no increased work of breathing Abd: Soft, NTND, Bsx4 Extremities: Warm, pink   Patient Lines/Drains/Airways Status     Active Line/Drains/Airways     Name Placement date Placement time Site Days   Peripheral IV 12/08/23 18 G Anterior;Distal;Right;Upper Arm 12/08/23  0857  Arm  1   Wound / Incision (Open or Dehisced) 10/30/22 Diabetic ulcer Foot Left;Posterior 10/30/22  2345  Foot  405            Pertinent labs and imaging:      Latest Ref Rng & Units 12/09/2023     4:24 AM 12/08/2023   11:47 AM 12/08/2023    9:02 AM  CBC  WBC 4.0 - 10.5 K/uL 8.9     Hemoglobin 13.0 - 17.0 g/dL 87.3  86.3  86.3   Hematocrit 39.0 - 52.0 % 37.6  40.0  40.0   Platelets 150 - 400 K/uL 198          Latest Ref Rng & Units 12/09/2023    4:24 AM 12/08/2023   11:47 AM 12/08/2023    9:02 AM  CMP  Glucose 70 - 99 mg/dL 786     BUN 6 - 20 mg/dL 27     Creatinine 9.38 - 1.24 mg/dL 8.77     Sodium 864 - 854 mmol/L 135  140  141   Potassium 3.5 - 5.1 mmol/L 3.9  4.6  3.9   Chloride 98 - 111 mmol/L 96     CO2 22 - 32 mmol/L 29     Calcium  8.9 - 10.3 mg/dL 9.1     Total Protein 6.5 - 8.1 g/dL 6.0     Total Bilirubin 0.0 - 1.2 mg/dL 0.3     Alkaline Phos 38 - 126 U/L 110     AST 15 - 41 U/L 14     ALT 0 - 44 U/L 17  MR BRAIN WO CONTRAST Result Date: 12/08/2023 EXAM: MRI BRAIN WITHOUT CONTRAST 12/08/2023 06:18:15 PM TECHNIQUE: Multiplanar multisequence MRI of the head/brain was performed without the administration of intravenous contrast. COMPARISON: None available. CLINICAL HISTORY: Stroke, follow up. FINDINGS: BRAIN AND VENTRICLES: No acute infarct. No intracranial hemorrhage. No mass. No midline shift. No hydrocephalus. The sella is unremarkable. Normal flow voids. Minimal nonspecific white matter T2-weighted signal hyperintensities, which may be associated with early chronic small vessel disease or migraine headaches. ORBITS: No acute abnormality. SINUSES AND MASTOIDS: No acute abnormality. BONES AND SOFT TISSUES: Normal marrow signal. No acute soft tissue abnormality. IMPRESSION: 1. No acute intracranial abnormality. 2. Minimal nonspecific white matter T2-weighted signal hyperintensities, possibly related to early chronic small vessel disease or migraine headaches. Electronically signed by: Franky Stanford MD 12/08/2023 08:47 PM EDT RP Workstation: [id number]   ECHOCARDIOGRAM COMPLETE BUBBLE STUDY Result Date: 12/08/2023    ECHOCARDIOGRAM REPORT   Patient Name:   Kenneth Bailey [facility] Date of Exam: 12/08/2023 Medical Rec #:  [id number]           Height:       68.0 in Accession #:    [id number]          Weight:       178.6 lb Date of Birth:  March 09, 1966            BSA:          1.948 m Patient Age:    58 years            BP:           148/89 mmHg Patient Gender: M                   HR:           77 bpm. Exam Location:  Inpatient Procedure: 2D Echo, Cardiac Doppler, Color Doppler and Saline Contrast Bubble            Study (Both Spectral and Color Flow Doppler were utilized during            procedure). Indications:    TIA 435.9 / G45.9  History:        Patient has prior history of Echocardiogram examinations, most                 recent 05/08/2023. CHF, Prior CABG, TIA, COPD and CKD,                 Signs/Symptoms:Hypotension and Syncope; Risk                 Factors:Hypertension, Diabetes, Dyslipidemia and Current Smoker.  Sonographer:    Thea Norlander RCS Referring Phys: [id number] JULIE MACHEN IMPRESSIONS  1. No left ventricular thrombus is seen, but Definity contrast was not used. Left ventricular ejection fraction, by estimation, is 30 to 35%. The left ventricle has moderately decreased function. The left ventricle demonstrates global hypokinesis. The left ventricular internal cavity size was mildly dilated. Left ventricular diastolic parameters are consistent with Grade I diastolic dysfunction (impaired relaxation).  2. Right ventricular systolic function is normal. The right ventricular size is normal. Tricuspid regurgitation signal is inadequate for assessing PA pressure.  3. Left atrial size was mildly dilated.  4. The mitral valve is normal in structure. Trivial mitral valve regurgitation. No evidence of mitral stenosis.  5. The aortic valve is tricuspid. There is mild calcification of the aortic valve. There is mild thickening of the aortic valve. Aortic valve regurgitation is  mild. Aortic valve sclerosis/calcification is present, without any evidence of aortic stenosis.  6.  The inferior vena cava is normal in size with greater than 50% respiratory variability, suggesting right atrial pressure of 3 mmHg.  7. Agitated saline contrast bubble study was positive with shunting observed within 3-6 cardiac cycles suggestive of interatrial shunt. Comparison(s): Prior images reviewed side by side. The left ventricular function is significantly worse. (but note that LV systolic function was overestimated on the 05/08/2023 study; on current review LVEF was approximately 40-45% with global LV hypokinesis on that study). FINDINGS  Left Ventricle: No left ventricular thrombus is seen, but Definity contrast was not used. Left ventricular ejection fraction, by estimation, is 30 to 35%. The left ventricle has moderately decreased function. The left ventricle demonstrates global hypokinesis. The left ventricular internal cavity size was mildly dilated. There is no left ventricular hypertrophy. Abnormal (paradoxical) septal motion consistent with post-operative status. Left ventricular diastolic parameters are consistent with Grade I diastolic dysfunction (impaired relaxation). Normal left ventricular filling pressure. Right Ventricle: The right ventricular size is normal. No increase in right ventricular wall thickness. Right ventricular systolic function is normal. Tricuspid regurgitation signal is inadequate for assessing PA pressure. Left Atrium: Left atrial size was mildly dilated. Right Atrium: Right atrial size was normal in size. Pericardium: There is no evidence of pericardial effusion. Mitral Valve: The mitral valve is normal in structure. Trivial mitral valve regurgitation, with centrally-directed jet. No evidence of mitral valve stenosis. Tricuspid Valve: The tricuspid valve is normal in structure. Tricuspid valve regurgitation is not demonstrated. Aortic Valve: The aortic valve is tricuspid. There is mild calcification of the aortic valve. There is mild thickening of the aortic valve. Aortic  valve regurgitation is mild. Aortic valve sclerosis/calcification is present, without any evidence of aortic stenosis. Aortic valve peak gradient measures 9.5 mmHg. Pulmonic Valve: The pulmonic valve was normal in structure. Pulmonic valve regurgitation is not visualized. No evidence of pulmonic stenosis. Aorta: The aortic root and ascending aorta are structurally normal, with no evidence of dilitation. Venous: The inferior vena cava is normal in size with greater than 50% respiratory variability, suggesting right atrial pressure of 3 mmHg. IAS/Shunts: No atrial level shunt detected by color flow Doppler. Agitated saline contrast was given intravenously to evaluate for intracardiac shunting. Agitated saline contrast bubble study was positive with shunting observed within 3-6 cardiac cycles suggestive of interatrial shunt.  LEFT VENTRICLE PLAX 2D LVIDd:         5.60 cm      Diastology LVIDs:         4.80 cm      LV e' medial:    5.98 cm/s LV PW:         1.20 cm      LV E/e' medial:  11.8 LV IVS:        0.70 cm      LV e' lateral:   8.92 cm/s LVOT diam:     2.00 cm      LV E/e' lateral: 7.9 LV SV:         65 LV SV Index:   34 LVOT Area:     3.14 cm  LV Volumes (MOD) LV vol d, MOD A2C: 83.8 ml LV vol d, MOD A4C: 155.0 ml LV vol s, MOD A2C: 59.4 ml LV vol s, MOD A4C: 115.0 ml LV SV MOD A2C:     24.4 ml LV SV MOD A4C:     155.0 ml LV SV MOD BP:  32.9 ml RIGHT VENTRICLE            IVC RV S prime:     9.03 cm/s  IVC diam: 2.00 cm TAPSE (M-mode): 1.3 cm LEFT ATRIUM             Index        RIGHT ATRIUM          Index LA diam:        4.40 cm 2.26 cm/m   RA Area:     9.85 cm LA Vol (A2C):   44.9 ml 23.05 ml/m  RA Volume:   17.90 ml 9.19 ml/m LA Vol (A4C):   43.7 ml 22.44 ml/m LA Biplane Vol: 44.7 ml 22.95 ml/m  AORTIC VALVE AV Area (Vmax): 2.33 cm AV Vmax:        154.00 cm/s AV Peak Grad:   9.5 mmHg LVOT Vmax:      114.00 cm/s LVOT Vmean:     75.200 cm/s LVOT VTI:       0.208 m  AORTA Ao Root diam: 3.20 cm Ao Asc  diam:  3.20 cm MITRAL VALVE MV Area (PHT): 5.97 cm     SHUNTS MV Decel Time: 127 msec     Systemic VTI:  0.21 m MV E velocity: 70.70 cm/s   Systemic Diam: 2.00 cm MV A velocity: 119.00 cm/s MV E/A ratio:  0.59 Mihai Croitoru MD Electronically signed by Jerel Balding MD Signature Date/Time: 12/08/2023/1:41:44 PM    Final    EEG adult Result Date: 12/08/2023 Shelton Arlin KIDD, MD     12/08/2023 12:56 PM Patient Name: Kenneth Bailey MRN: [id number] Epilepsy Attending: Arlin KIDD Shelton Referring Physician/Provider: everitt Clint Abbey Earle FORBES, NP Date: 12/08/2023 Duration: 27.21 mins Patient history: 58 yo male presented as code stroke with decreased responsiveness. EEG to evaluate for seizure Level of alertness: Awake, asleep AEDs during EEG study: None Technical aspects: This EEG study was done with scalp electrodes positioned according to the 10-20 International system of electrode placement. Electrical activity was reviewed with band pass filter of 1-70Hz , sensitivity of 7 uV/mm, display speed of 24mm/sec with a 60Hz  notched filter applied as appropriate. EEG data were recorded continuously and digitally stored.  Video monitoring was available and reviewed as appropriate. Description: The posterior dominant rhythm consists of 8-9 Hz activity of moderate voltage (25-35 uV) seen predominantly in posterior head regions, symmetric and reactive to eye opening and eye closing. Sleep was characterized by vertex waves, sleep spindles (12 to 14 Hz), maximal frontocentral region. Hyperventilation and photic stimulation were not performed.   IMPRESSION: This study is within normal limits. No seizures or epileptiform discharges were seen throughout the recording. A normal interictal EEG does not exclude the diagnosis of epilepsy. Arlin KIDD Shelton    ASSESSMENT/PLAN:  Assessment: Principal Problem:   TIA (transient ischemic attack) Active Problems:   Hx of CABG   Tobacco use   Diabetes mellitus, type 2 (HCC)   COPD  (chronic obstructive pulmonary disease) (HCC)   Postural hypotension   Chronic systolic CHF (congestive heart failure) (HCC)   CKD (chronic kidney disease)   Marijuana use   HFrEF (heart failure with reduced ejection fraction) (HCC)   Plan: #Stroke workup #Lethargy TIA vs acute infarct suspected on admission. CT and MRI negative for acute infarct. EEG normal. 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  -UDS positive for THC. Suspect lethargy and stroke like symptoms were induced by [facility] as pt is doing significantly better this  morning with improvement of his neuro exam. Counseled pt on cessation.  -DVT study pending per neuro due to shunt seen on echo -Medically stable for discharge with close cardiology follow up.   #HFrEF #Intraapical shunt Echo with bubble study performed yesterday showed EF 30-35%, reduced from last echo in 2024. Intra apical shunt also seen. 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  -Pt follows with cardiology outpt; however he has not tolerated GDMT due to him not  feeling well on the medications -Educated about the importance of close cardiology follow up and GDMT if tolerated.  -Currently not in acute exacerbation. Medically stable for discharge.  -DVT pending per neuro  #T2DM Last A1c 8.6. Pt takes 40-50units glargine at home.  -Continue 20units semglee  and SSI -AC/HS CBG checks  Best Practice: Diet: Cardiac diet IVF: Fluids: none, Rate: None VTE: enoxaparin  (LOVENOX ) injection 40 mg Start: 12/08/23 2145 SCDs Start: 12/08/23 1048 Code: Full  Disposition planning: Therapy Recs: None,  Family Contact: Kenneth Bailey, to be notified. DISPO: Anticipated discharge today to Home pending DVT study.  Signature:  Schuyler Novak, DO [facility]  11:29 AM, 12/09/2023  On Call pager [phone]

## 2023-12-09 NOTE — Progress Notes (Addendum)
 STROKE TEAM PROGRESS NOTE    SIGNIFICANT HOSPITAL EVENTS  7/20: Presets with acute onset generalized weakness, jitteriness, decreased level of consciousness and gait impairment, as well as dysarthria MRI negative.   INTERIM HISTORY/SUBJECTIVE  On exam, patient has slight dysarthria, no focal weakness or sensory deficits.   Pending LE DVT US   OBJECTIVE  CBC    Component Value Date/Time   WBC 8.9 12/09/2023 0424   RBC 4.46 12/09/2023 0424   HGB 12.6 (L) 12/09/2023 0424   HGB 15.8 08/29/2023 0913   HCT 37.6 (L) 12/09/2023 0424   PLT 198 12/09/2023 0424   PLT 233 08/29/2023 0913   MCV 84.3 12/09/2023 0424   MCH 28.3 12/09/2023 0424   MCHC 33.5 12/09/2023 0424   RDW 13.6 12/09/2023 0424   LYMPHSABS 1.5 12/08/2023 0823   MONOABS 0.5 12/08/2023 0823   EOSABS 0.1 12/08/2023 0823   BASOSABS 0.1 12/08/2023 0823    BMET    Component Value Date/Time   NA 135 12/09/2023 0424   NA 138 04/02/2023 1410   K 3.9 12/09/2023 0424   CL 96 (L) 12/09/2023 0424   CO2 29 12/09/2023 0424   GLUCOSE 213 (H) 12/09/2023 0424   BUN 27 (H) 12/09/2023 0424   BUN 27 (H) 04/02/2023 1410   CREATININE 1.22 12/09/2023 0424   CREATININE 1.25 (H) 08/29/2023 0913   CREATININE 1.41 (H) 01/01/2023 1020   CALCIUM  9.1 12/09/2023 0424   EGFR 77 04/02/2023 1410   GFRNONAA >60 12/09/2023 0424   GFRNONAA >60 08/29/2023 0913    IMAGING past 24 hours MR BRAIN WO CONTRAST Result Date: 12/08/2023 EXAM: MRI BRAIN WITHOUT CONTRAST 12/08/2023 06:18:15 PM TECHNIQUE: Multiplanar multisequence MRI of the head/brain was performed without the administration of intravenous contrast. COMPARISON: None available. CLINICAL HISTORY: Stroke, follow up. FINDINGS: BRAIN AND VENTRICLES: No acute infarct. No intracranial hemorrhage. No mass. No midline shift. No hydrocephalus. The sella is unremarkable. Normal flow voids. Minimal nonspecific white matter T2-weighted signal hyperintensities, which may be associated with early  chronic small vessel disease or migraine headaches. ORBITS: No acute abnormality. SINUSES AND MASTOIDS: No acute abnormality. BONES AND SOFT TISSUES: Normal marrow signal. No acute soft tissue abnormality. IMPRESSION: 1. No acute intracranial abnormality. 2. Minimal nonspecific white matter T2-weighted signal hyperintensities, possibly related to early chronic small vessel disease or migraine headaches. Electronically signed by: Franky Stanford MD 12/08/2023 08:47 PM EDT RP Workstation: [id number]   ECHOCARDIOGRAM COMPLETE BUBBLE STUDY Result Date: 12/08/2023    ECHOCARDIOGRAM REPORT   Patient Name:   DEVONTAE CASASOLA [facility] Date of Exam: 12/08/2023 Medical Rec #:  [id number]           Height:       68.0 in Accession #:    [id number]          Weight:       178.6 lb Date of Birth:  05/29/1965            BSA:          1.948 m Patient Age:    58 years            BP:           148/89 mmHg Patient Gender: M                   HR:           77 bpm. Exam Location:  Inpatient Procedure: 2D Echo, Cardiac Doppler, Color Doppler and Saline Contrast Bubble  Study (Both Spectral and Color Flow Doppler were utilized during            procedure). Indications:    TIA 435.9 / G45.9  History:        Patient has prior history of Echocardiogram examinations, most                 recent 05/08/2023. CHF, Prior CABG, TIA, COPD and CKD,                 Signs/Symptoms:Hypotension and Syncope; Risk                 Factors:Hypertension, Diabetes, Dyslipidemia and Current Smoker.  Sonographer:    Thea Norlander RCS Referring Phys: [id number] JULIE MACHEN IMPRESSIONS  1. No left ventricular thrombus is seen, but Definity contrast was not used. Left ventricular ejection fraction, by estimation, is 30 to 35%. The left ventricle has moderately decreased function. The left ventricle demonstrates global hypokinesis. The left ventricular internal cavity size was mildly dilated. Left ventricular diastolic parameters are consistent with Grade I  diastolic dysfunction (impaired relaxation).  2. Right ventricular systolic function is normal. The right ventricular size is normal. Tricuspid regurgitation signal is inadequate for assessing PA pressure.  3. Left atrial size was mildly dilated.  4. The mitral valve is normal in structure. Trivial mitral valve regurgitation. No evidence of mitral stenosis.  5. The aortic valve is tricuspid. There is mild calcification of the aortic valve. There is mild thickening of the aortic valve. Aortic valve regurgitation is mild. Aortic valve sclerosis/calcification is present, without any evidence of aortic stenosis.  6. The inferior vena cava is normal in size with greater than 50% respiratory variability, suggesting right atrial pressure of 3 mmHg.  7. Agitated saline contrast bubble study was positive with shunting observed within 3-6 cardiac cycles suggestive of interatrial shunt. Comparison(s): Prior images reviewed side by side. The left ventricular function is significantly worse. (but note that LV systolic function was overestimated on the 05/08/2023 study; on current review LVEF was approximately 40-45% with global LV hypokinesis on that study). FINDINGS  Left Ventricle: No left ventricular thrombus is seen, but Definity contrast was not used. Left ventricular ejection fraction, by estimation, is 30 to 35%. The left ventricle has moderately decreased function. The left ventricle demonstrates global hypokinesis. The left ventricular internal cavity size was mildly dilated. There is no left ventricular hypertrophy. Abnormal (paradoxical) septal motion consistent with post-operative status. Left ventricular diastolic parameters are consistent with Grade I diastolic dysfunction (impaired relaxation). Normal left ventricular filling pressure. Right Ventricle: The right ventricular size is normal. No increase in right ventricular wall thickness. Right ventricular systolic function is normal. Tricuspid regurgitation signal  is inadequate for assessing PA pressure. Left Atrium: Left atrial size was mildly dilated. Right Atrium: Right atrial size was normal in size. Pericardium: There is no evidence of pericardial effusion. Mitral Valve: The mitral valve is normal in structure. Trivial mitral valve regurgitation, with centrally-directed jet. No evidence of mitral valve stenosis. Tricuspid Valve: The tricuspid valve is normal in structure. Tricuspid valve regurgitation is not demonstrated. Aortic Valve: The aortic valve is tricuspid. There is mild calcification of the aortic valve. There is mild thickening of the aortic valve. Aortic valve regurgitation is mild. Aortic valve sclerosis/calcification is present, without any evidence of aortic stenosis. Aortic valve peak gradient measures 9.5 mmHg. Pulmonic Valve: The pulmonic valve was normal in structure. Pulmonic valve regurgitation is not visualized. No evidence of pulmonic stenosis. Aorta: The  aortic root and ascending aorta are structurally normal, with no evidence of dilitation. Venous: The inferior vena cava is normal in size with greater than 50% respiratory variability, suggesting right atrial pressure of 3 mmHg. IAS/Shunts: No atrial level shunt detected by color flow Doppler. Agitated saline contrast was given intravenously to evaluate for intracardiac shunting. Agitated saline contrast bubble study was positive with shunting observed within 3-6 cardiac cycles suggestive of interatrial shunt.  LEFT VENTRICLE PLAX 2D LVIDd:         5.60 cm      Diastology LVIDs:         4.80 cm      LV e' medial:    5.98 cm/s LV PW:         1.20 cm      LV E/e' medial:  11.8 LV IVS:        0.70 cm      LV e' lateral:   8.92 cm/s LVOT diam:     2.00 cm      LV E/e' lateral: 7.9 LV SV:         65 LV SV Index:   34 LVOT Area:     3.14 cm  LV Volumes (MOD) LV vol d, MOD A2C: 83.8 ml LV vol d, MOD A4C: 155.0 ml LV vol s, MOD A2C: 59.4 ml LV vol s, MOD A4C: 115.0 ml LV SV MOD A2C:     24.4 ml LV SV MOD  A4C:     155.0 ml LV SV MOD BP:      32.9 ml RIGHT VENTRICLE            IVC RV S prime:     9.03 cm/s  IVC diam: 2.00 cm TAPSE (M-mode): 1.3 cm LEFT ATRIUM             Index        RIGHT ATRIUM          Index LA diam:        4.40 cm 2.26 cm/m   RA Area:     9.85 cm LA Vol (A2C):   44.9 ml 23.05 ml/m  RA Volume:   17.90 ml 9.19 ml/m LA Vol (A4C):   43.7 ml 22.44 ml/m LA Biplane Vol: 44.7 ml 22.95 ml/m  AORTIC VALVE AV Area (Vmax): 2.33 cm AV Vmax:        154.00 cm/s AV Peak Grad:   9.5 mmHg LVOT Vmax:      114.00 cm/s LVOT Vmean:     75.200 cm/s LVOT VTI:       0.208 m  AORTA Ao Root diam: 3.20 cm Ao Asc diam:  3.20 cm MITRAL VALVE MV Area (PHT): 5.97 cm     SHUNTS MV Decel Time: 127 msec     Systemic VTI:  0.21 m MV E velocity: 70.70 cm/s   Systemic Diam: 2.00 cm MV A velocity: 119.00 cm/s MV E/A ratio:  0.59 Mihai Croitoru MD Electronically signed by Jerel Balding MD Signature Date/Time: 12/08/2023/1:41:44 PM    Final    EEG adult Result Date: 12/08/2023 Shelton Arlin KIDD, MD     12/08/2023 12:56 PM Patient Name: Gatlyn Lipari MRN: [id number] Epilepsy Attending: Arlin KIDD Shelton Referring Physician/Provider: everitt Clint Abbey Earle FORBES, NP Date: 12/08/2023 Duration: 27.21 mins Patient history: 58 yo male presented as code stroke with decreased responsiveness. EEG to evaluate for seizure Level of alertness: Awake, asleep AEDs during EEG study: None Technical aspects: This EEG study  was done with scalp electrodes positioned according to the 10-20 International system of electrode placement. Electrical activity was reviewed with band pass filter of 1-70Hz , sensitivity of 7 uV/mm, display speed of 36mm/sec with a 60Hz  notched filter applied as appropriate. EEG data were recorded continuously and digitally stored.  Video monitoring was available and reviewed as appropriate. Description: The posterior dominant rhythm consists of 8-9 Hz activity of moderate voltage (25-35 uV) seen predominantly in posterior head  regions, symmetric and reactive to eye opening and eye closing. Sleep was characterized by vertex waves, sleep spindles (12 to 14 Hz), maximal frontocentral region. Hyperventilation and photic stimulation were not performed.   IMPRESSION: This study is within normal limits. No seizures or epileptiform discharges were seen throughout the recording. A normal interictal EEG does not exclude the diagnosis of epilepsy. Arlin MALVA Krebs   DG Chest Port 1 View Result Date: 12/08/2023 CLINICAL DATA:  Altered mental status. EXAM: PORTABLE CHEST 1 VIEW COMPARISON:  10/09/2023 FINDINGS: Status post median sternotomy and CABG procedure. Stable cardiomediastinal contours. Decreased lung volumes. Increased pulmonary vascular congestion with mild prominent peripheral septal markings in the lower lung zones. Subsegmental atelectasis noted in the left base. No significant effusion. IMPRESSION: 1. Increased pulmonary vascular congestion and mild interstitial edema. 2. Left base subsegmental atelectasis. Electronically Signed   By: Waddell Calk M.D.   On: 12/08/2023 09:18   CT ANGIO HEAD NECK W WO CM W PERF (CODE STROKE) Result Date: 12/08/2023 CLINICAL DATA:  58 year old male code stroke presentation. EXAM: CT ANGIOGRAPHY HEAD AND NECK CT PERFUSION BRAIN TECHNIQUE: Multidetector CT imaging of the head and neck was performed using the standard protocol during bolus administration of intravenous contrast. Multiplanar CT image reconstructions and MIPs were obtained to evaluate the vascular anatomy. Carotid stenosis measurements (when applicable) are obtained utilizing NASCET criteria, using the distal internal carotid diameter as the denominator. Multiphase CT imaging of the brain was performed following IV bolus contrast injection. Subsequent parametric perfusion maps were calculated using RAPID software. RADIATION DOSE REDUCTION: This exam was performed according to the departmental dose-optimization program which includes  automated exposure control, adjustment of the mA and/or kV according to patient size and/or use of iterative reconstruction technique. CONTRAST:  OMNIPAQUE  IOHEXOL  350 MG/ML SOLN COMPARISON:  Plain head CT 0830 hours today. FINDINGS: CT Brain Perfusion Findings: ASPECTS: 10 CBF (<30%) Volume: None Perfusion (Tmax>6.0s) volume: None Mismatch Volume: Not applicable Infarction Location:Not applicable CTA NECK Skeleton: Prior sternotomy. Absent dentition. Cervical spine degeneration. No acute osseous abnormality identified. Upper chest: Prior CABG. Mild upper lung atelectasis, possible underlying emphysema. Other neck: Nonvascular neck soft tissue spaces are within normal limits. Aortic arch: 3 vessel arch. Prior CABG. Soft more so than calcified arch atherosclerosis. Right carotid system: Mild soft plaque of the brachiocephalic and proximal right CCA without stenosis. Moderate soft and calcified plaque right ICA origin and bulb with 60 % stenosis with respect to the distal vessel. Left carotid system: Left CCA soft plaque without stenosis. Soft and calcified plaque of the proximal left ICA with less than 50 % stenosis with respect to the distal vessel. Vertebral arteries: Soft plaque right subclavian artery origin without stenosis. Soft plaque at the right vertebral artery origin with moderate to severe stenosis as seen on series 8, image 176. Right vertebral remains patent and is otherwise negative to the skull base. Proximal left subclavian soft plaque without stenosis. Severe stenosis left vertebral artery origin, radiographic string sign (series 5, image 286). Diminutive caliber of the left  vertebral artery although it does remain patent to the skull base. Left vertebral appears mildly irregular throughout. CTA HEAD Posterior circulation: Distal left vertebral artery is nearly occluded at the left PICA origin, and likewise appears highly diminutive and functionally occluded just distal to the left PICA which  remains patent. Right vertebral V4 segment appears somewhat dominant and is patent to the vertebrobasilar junction with mild irregularity. Right PICA remains patent. Basilar artery remains patent. No basilar stenosis. SCA and right PCA origins are normal. Fetal type left PCA origin. Bilateral PCA branches are within normal limits. Anterior circulation: Both ICA siphons are patent although atherosclerotic. Left siphon combined soft and calcified atherosclerosis with mild to moderate siphon stenosis perhaps most pronounced in the petrous segment on series 11, image 168. Contralateral right ICA siphon similar widespread atherosclerosis with both soft and calcified plaque. Only mild right siphon stenosis. Patent carotid termini. Normal left posterior communicating artery origin. Patent and normal MCA and ACA origins. Normal anterior communicating artery. Bilateral ACA branches are within normal limits. Bilateral MCA M1 segments and branches are within normal limits. Venous sinuses: Patent. Venous mixing artifact within the right IJ (greater than 150 Hounsfield units). Anatomic variants: Fetal type left PCA origin. Affectively dominant right vertebral artery. Review of the MIP images confirms the above findings Preliminary of the above was discussed by telephone with Dr. ELIDA ROSS on 12/08/2023 at 08:57 . IMPRESSION: 1. Negative CT Perfusion. 2. CTA is negative for ELVO, but Positive for Severe bilateral Vertebral Artery stenoses: - string sign stenosis Left vertebral origin with diminutive left vert and functionally occluded distal left V4 segment (left PICA remains patent). - Severe Right vertebral origin stenosis. Otherwise better right vertebral patency to the basilar. Basilar and remaining posterior circulation within normal limits. 3. Bilateral carotid atherosclerosis is Moderate. 60% stenosis proximal Right ICA. Up to moderate stenosis left ICA siphon. 4.  Aortic Atherosclerosis (ICD10-I70.0).  Prior CABG.  Salient findings discussed by telephone with Dr. ELIDA ROSS on 12/08/2023 at 08:57 . Electronically Signed   By: VEAR Hurst M.D.   On: 12/08/2023 09:04   CT HEAD CODE STROKE WO CONTRAST Result Date: 12/08/2023 CLINICAL DATA:  Code stroke.  58 year old male. EXAM: CT HEAD WITHOUT CONTRAST TECHNIQUE: Contiguous axial images were obtained from the base of the skull through the vertex without intravenous contrast. RADIATION DOSE REDUCTION: This exam was performed according to the departmental dose-optimization program which includes automated exposure control, adjustment of the mA and/or kV according to patient size and/or use of iterative reconstruction technique. COMPARISON:  Head CT 01/21/2023. FINDINGS: Brain: Cerebral volume remains normal. No midline shift, ventriculomegaly, mass effect, evidence of mass lesion, intracranial hemorrhage or evidence of cortically based acute infarction. Gray-white differentiation appears stable and within normal limits throughout the brain. Vascular: No suspicious intracranial vascular hyperdensity. Calcified atherosclerosis at the skull base. Skull: Intact.  No acute osseous abnormality identified. Sinuses/Orbits: Visualized paranasal sinuses and mastoids are stable and well aerated. Other: Leftward gaze. Visualized scalp soft tissues are within normal limits. ASPECTS [facility] Stroke Program Early CT Score) Total score (0-10 with 10 being normal): 10 IMPRESSION: Stable and normal for age noncontrast CT appearance of the brain. ASPECTS 10. These results were communicated to Dr. ROSS at 8:40 am on 12/08/2023 by text page via the [facility] messaging system. Electronically Signed   By: VEAR Hurst M.D.   On: 12/08/2023 08:40    Vitals:   12/08/23 2102 12/09/23 0043 12/09/23 0400 12/09/23 0530  BP: 130/77 120/68 125/71  Pulse: 74 71 70   Resp: 18 17 18    Temp: 98.2 F (36.8 C) 98.3 F (36.8 C) 98.1 F (36.7 C)   TempSrc: Oral Oral Oral   SpO2: 98% 97% 97%   Weight:    81.6 kg   Height:        PHYSICAL EXAM General:  Alert, well-nourished, well-developed patient in no acute distress Psych:  Mood and affect appropriate for situation CV: Regular rate and rhythm on monitor Respiratory:  Regular, unlabored respirations on room air GI: Abdomen soft and nontender  NEURO:  Mental Status: AA&Ox3, patient is able to give clear and coherent history Speech/Language: speech is without aphasia.  Slight dysarthria. Naming, repetition, fluency, and comprehension intact.  Cranial Nerves:  II: PERRL. Visual fields full (glasses at baseline) III, IV, VI: EOMI. Eyelids elevate symmetrically.  V: Sensation is intact to light touch and symmetrical to face.  VII: Facial movement symmetric VIII: hearing intact to voice. IX, X: Slight dysarthria. No hoarseness.  KP:Dynloizm shrug 5/5. XII: tongue is midline without fasciculations. Motor: 5/5 strength to all muscle groups tested.  Tone: is normal and bulk is normal Sensation- Intact to light touch bilaterally. Does have history of diabetic neuropathy with some decreased sensation bilateral feet.   Coordination: FTN intact bilaterally, HKS: no ataxia in BLE.No drift.  Gait- deferred  Most Recent NIH: 1     ASSESSMENT/PLAN  Mr. Zalmen Wrightsman is a  57 y.o. male with hx of CAD status post CABG in 2023, CHF, COPD, peptic ulcer disease, PAD, diabetes, hyperlipidemia, upper GI bleed and smoking who presents with acute onset generalized weakness, jitteriness, decreased level of consciousness and gait impairment on waking up.  He called EMS and was found to have a decreased LOC as well as dysarthria. NIH on Admission: 12.  Generalized weakness and lethargy  Code Stroke CT head No acute abnormality. ASPECTS 10.    CTA head & neck Negative for ELVO CT perfusion: Negative  MRI  No acute intracranial abnormality. 2D Echo: LVEF 30-35%, global Hypokinesis LV EEG normal LDL 42 HgbA1c 7.9 VTE prophylaxis - lovenox  clopidogrel   75 mg daily prior to admission, now on aspirin  81 mg daily and clopidogrel  75 mg daily for 3 weeks and then return to plavix  alone. Therapy recommendations:  No follow up needed  Disposition:  home  Cardiomyopathy CAD s/p CABG 2023 TTE showed EF 30-35% 04/2023 TTE showed EF 55-60%  At least partially explains pt generalized weakness and lethargy Pt not compliance with home meds Recommend cardiology inpt consult or outpt follow up  Intracranial stenosis CTA head and neck - Positive for Severe bilateral Vertebral Artery stenoses. String sign stenosis Left vertebral origin with diminutive left vert and functionally occluded distal left V4 segment (left PICA remains patent). Severe Right vertebral origin stenosis. Otherwise better right vertebral patency to the basilar. Basilar and remaining posterior circulation within normal limits. On DAPT Avoid low BP or dehydration  PFO TTE showed positive bubble study and dilated LA this admission  LE venous doppler age indeterminate deep vein thrombosis involving the left popliteal vein, left posterior tibial veins, and left peroneal veins.  MRI showed no infarct Outpt follow up No indication for PFO closure  Hypertension Home meds:  none Stable Avoid hypotension Long term BP goal normotensive  Hyperlipidemia Home meds:  repatha  LDL 42, goal < 70 TG 553 Add zetia   Continue zetia  and home repatha  at discharge  Diabetes type II Uncontrolled Home meds:  Metformin 1000mg  BID,  Insulin  Lantus  HgbA1c 7.9, goal < 7.0 CBGs SSI Recommend close follow-up with PCP for better DM control  Tobacco Abuse Patient smokes 1 packs per day for 50+ years      Ready to quit? No Nicotine  replacement therapy provided  Substance Abuse UDS positive for THC  Pt admitted using THC gummy THC cessation education provided Pt not sure about quitting at this time  Other Stroke Risk Factors ETOH use, alcohol level <15, advised to drink no more than 1-2  drink(s) a day PAD  Other Active Problems CKD, Stage 3a, Cre 1.22 COPD Hx of PUD, Home meds: Prilosec  Hospital day # 0   Pt seen by Neuro NP/APP and later by MD. Note/plan to be edited by MD as needed.    Rocky JAYSON Likes, DNP, AGACNP-BC [facility] Please use AMION for contact information & EPIC for messaging.  ATTENDING NOTE: I reviewed above note and agree with the assessment and plan. Pt was seen and examined.   Brother at the bedside. Pt sat at the edge of bed, neuro stable, awake alert and less lethargic. Stated that he is not compliance with cardiac meds as the medication made him sick at home. No neuro focal deficit. Pt presenting symptoms non-specific, could be from cardiomyopathy. Recommend cardiology consult. Continue DAPT and home repatha , add zetia  due to high TG. Management of metabolic encephalopathy per primary team  For detailed assessment and plan, please refer to above as I have made changes wherever appropriate.   Neurology will sign off. Please call with questions. No neuro follow up needed at this time. Thanks for the consult.   Ary Cummins, MD PhD Stroke Neurology 12/09/2023 11:41 PM     To contact Stroke Continuity provider, please refer to [email or web address]. After hours, contact General Neurology

## 2023-12-09 NOTE — Progress Notes (Signed)
 VASCULAR LAB    Bilateral lower extremity venous duplex has been performed.  See CV proc for preliminary results.   Karuna Balducci, RVT 12/09/2023, 4:38 PM

## 2023-12-09 NOTE — Plan of Care (Signed)
  Problem: Education: Goal: Knowledge of disease or condition will improve Outcome: Progressing   Problem: Nutrition: Goal: Adequate nutrition will be maintained 12/09/2023 1906 by Vivian Carlos BIRCH, RN Outcome: Progressing 12/09/2023 1906 by Vivian Carlos BIRCH, RN Outcome: Not Progressing   Problem: Elimination: Goal: Will not experience complications related to bowel motility 12/09/2023 1906 by Vivian Carlos BIRCH, RN Outcome: Progressing 12/09/2023 1906 by Vivian Carlos BIRCH, RN Outcome: Not Progressing   Problem: Safety: Goal: Ability to remain free from injury will improve 12/09/2023 1906 by Vivian Carlos BIRCH, RN Outcome: Progressing 12/09/2023 1906 by Vivian Carlos BIRCH, RN Outcome: Not Progressing   Problem: Skin Integrity: Goal: Risk for impaired skin integrity will decrease 12/09/2023 1906 by Vivian Carlos BIRCH, RN Outcome: Progressing 12/09/2023 1906 by Vivian Carlos BIRCH, RN Outcome: Not Progressing

## 2023-12-09 NOTE — Progress Notes (Signed)
 Patient not for discharge tonight. Agreed to hook again in cardiac telebox.

## 2023-12-09 NOTE — Plan of Care (Signed)

## 2023-12-09 NOTE — Progress Notes (Signed)
 SLP Cancellation Note  Patient Details Name: Kenneth Bailey MRN: [id number] DOB: [birth year]   Cancelled treatment:        Orders for cognitive linguistic assessment received and appreciated.  Spoke with pt who reports significant improvement from yesterday.  MRI negative for acute findings.  Pt denies word finding difficulties or changes to cognition.  He politely declines evaluation at this time.  Planned for discharge today.  SLP will sign off.    Anette FORBES Grippe, MA, CCC-SLP Acute Rehabilitation Services Office: [phone] 12/09/2023, 11:07 AM

## 2023-12-10 ENCOUNTER — Encounter: Payer: Self-pay | Admitting: Hematology and Oncology

## 2023-12-10 ENCOUNTER — Other Ambulatory Visit (HOSPITAL_COMMUNITY): Payer: Self-pay

## 2023-12-10 DIAGNOSIS — I5022 Chronic systolic (congestive) heart failure: Principal | ICD-10-CM

## 2023-12-10 LAB — CBC
HCT: 41.2 % (ref 39.0–52.0)
Hemoglobin: 13.6 g/dL (ref 13.0–17.0)
MCH: 27.5 pg (ref 26.0–34.0)
MCHC: 33 g/dL (ref 30.0–36.0)
MCV: 83.2 fL (ref 80.0–100.0)
Platelets: 204 K/uL (ref 150–400)
RBC: 4.95 MIL/uL (ref 4.22–5.81)
RDW: 13.5 % (ref 11.5–15.5)
WBC: 8.1 K/uL (ref 4.0–10.5)
nRBC: 0 % (ref 0.0–0.2)

## 2023-12-10 LAB — GLUCOSE, CAPILLARY
Glucose-Capillary: 155 mg/dL — ABNORMAL HIGH (ref 70–99)
Glucose-Capillary: 193 mg/dL — ABNORMAL HIGH (ref 70–99)

## 2023-12-10 LAB — MRSA NEXT GEN BY PCR, NASAL: MRSA by PCR Next Gen: NOT DETECTED

## 2023-12-10 MED ORDER — MAGNESIUM SULFATE 4 GM/100ML IV SOLN
4.0000 g | Freq: Once | INTRAVENOUS | Status: AC
  Administered 2023-12-10: 4 g via INTRAVENOUS
  Filled 2023-12-10: qty 100

## 2023-12-10 MED ORDER — LANTUS SOLOSTAR 100 UNIT/ML ~~LOC~~ SOPN: 30.0000 [IU] | PEN_INJECTOR | Freq: Every day | SUBCUTANEOUS | 11 refills | Status: AC | PRN

## 2023-12-10 MED ORDER — ASPIRIN 81 MG PO CHEW: 81.0000 mg | CHEWABLE_TABLET | Freq: Every day | ORAL | 0 refills | Status: AC

## 2023-12-10 MED ORDER — ASPIRIN 81 MG PO CHEW
81.0000 mg | CHEWABLE_TABLET | Freq: Every day | ORAL | 0 refills | Status: DC
  Filled 2023-12-10: qty 18, 18d supply, fill #0

## 2023-12-10 NOTE — TOC Transition Note (Signed)
 Transition of Care [facility]) - Discharge Note   Patient Details  Name: Kenneth Bailey MRN: [id number] Date of Birth: [birth year]  [facility] CM/SW Contact:  Roxie KANDICE Stain, RN Phone Number: 12/10/2023, 12:07 PM   Clinical Narrative:    [name] is stable to discharge home.  Patient has follow up apts on AVS. No TOC needs at this time.   Final next level of care: Home/Self Care Barriers to Discharge: Barriers Resolved   Patient Goals and CMS Choice Patient states their goals for this hospitalization and ongoing recovery are:: return home          Discharge Placement  Home                     Discharge Plan and Services Additional resources added to the After Visit Summary for                                       Social Drivers of Health (SDOH) Interventions SDOH Screenings   Food Insecurity: No Food Insecurity (12/08/2023)  Housing: Low Risk  (12/08/2023)  Transportation Needs: No Transportation Needs (12/08/2023)  Utilities: Not At Risk (12/08/2023)  Depression (PHQ2-9): Low Risk  (01/23/2023)  Financial Resource Strain: Not at Risk (10/01/2023)   Received from [facility]  Physical Activity: At Risk (10/01/2023)   Received from [facility]  Social Connections: Not at Risk (10/01/2023)   Received from [facility]  Stress: Not at Risk (10/01/2023)   Received from [facility]  Tobacco Use: High Risk (12/08/2023)     Readmission Risk Interventions     No data to display

## 2023-12-25 ENCOUNTER — Encounter: Payer: Self-pay | Admitting: Podiatry

## 2023-12-25 ENCOUNTER — Ambulatory Visit: Admitting: Podiatry

## 2023-12-25 DIAGNOSIS — E1151 Type 2 diabetes mellitus with diabetic peripheral angiopathy without gangrene: Secondary | ICD-10-CM | POA: Diagnosis not present

## 2023-12-25 DIAGNOSIS — Z89422 Acquired absence of other left toe(s): Secondary | ICD-10-CM

## 2023-12-25 DIAGNOSIS — M79674 Pain in right toe(s): Secondary | ICD-10-CM

## 2023-12-25 DIAGNOSIS — M79675 Pain in left toe(s): Secondary | ICD-10-CM

## 2023-12-25 DIAGNOSIS — B351 Tinea unguium: Secondary | ICD-10-CM | POA: Diagnosis not present

## 2023-12-25 NOTE — Patient Instructions (Signed)
 Look for urea 12-40% cream or ointment and apply to the thickened dry skin / calluses. This can be bought over the counter, at a pharmacy or online such as [employer].  Can start with lower concentrations of this and increase as needed.

## 2023-12-25 NOTE — Progress Notes (Unsigned)
  Subjective:  Patient ID: Kenneth Bailey, male    DOB: April 03, 1966,  MRN: [id number]  Chief Complaint  Patient presents with   [facility]    [facility] with out callous, last A1c was 7.9 on July. Takes ASA and plavix .     58 y.o. male presents today for diabetic footcare.  Last A1c 7.9.  Presents with painful, thickened, elongated, dystrophic toenails that he is unable to maintain to himself due to their dystrophic nature.  He does have history of left partial fifth ray resection, partial fourth metatarsal resection.  No evidence of recurrence to the left forefoot ulcer at this point.  Review of Systems: Negative except as noted in the HPI. Denies N/V/F/Ch.   Objective:  There were no vitals filed for this visit. There is no height or weight on file to calculate BMI. Constitutional Well developed. Well nourished.  Vascular  Nonpalpable pedal pulses left foot. Capillary refill intact to all digits.  Calf is soft and supple, no posterior calf or knee pain, negative Homans' sign.  Pedal hair growth absent  Neurologic Normal speech. Oriented to person, place, and time. Epicritic sensation to light touch grossly present bilaterally.  Protective sensation decreased  Dermatologic Area of stable hyperkeratotic tissue present base of lateral left fourth toe, nontender on palpation. Nail plates x 9 are notable for increased thickness, increased length, yellow discoloration and subungual debris.  They are painful on direct dorsal palpation.   Orthopedic: Prior fourth and fifth ray amputation site. Muscle strength 5/5 for all major muscle groups     Assessment:   1. Type 2 diabetes mellitus with diabetic peripheral angiopathy without gangrene, unspecified whether long term insulin  use (HCC)   2. 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 Pain due to onychomycosis of toenails of both feet     Plan:  Patient was evaluated and treated and all questions answered.  # Left  foot ulcer of left fourth toe with fat layer exposed #History of partial 5th ray and 4th metatarsal resection -No recurrence of the ulcer at this point - Hyperkeratotic callus was left intact as this appeared stable not overly hypertrophic at this point in time. - Discussed with patient importance of close monitoring of the site for signs of recurrence or signs of infection and to contact office or go to emergency room immediately if this occurs -Recommend continued offloading and use of topical urea cream to maintain hyperkeratotic tissue - Is awaiting diabetic shoes at this point   # Painful onychomycosis of toenails x 9 -Nail plates x 9 were debrided in thickness and length using sterile nail nippers without incident. - Mechanical bur used to file the nails down thin.       Return in about 9 weeks (around 02/26/2024) for Diabetic Foot Care.         Ethan Saddler, D.P.M. [facility]

## 2024-01-17 ENCOUNTER — Telehealth: Payer: Self-pay

## 2024-01-17 NOTE — Telephone Encounter (Signed)
 This RN advised pharmacy this medication will need to be sent to pt's PCP for refill and clarification.        Copied from CRM #[id number]. Topic: Clinical - Prescription Issue >> Jan 17, 2024  1:35 PM Mercer PEDLAR wrote: Reason for CRM: Leita calling from [facility] regarding prescription for LANTUS  SOLOSTAR 100 UNIT/ML Solostar Pen. She stated that when patient went to pickup prescription he mentioned that he is having to use 46 units twice a day to manage his blood sugar levels which is different than the instructions sent on the prescription. Clarification is needed.   Callback: [phone]

## 2024-01-19 IMAGING — DX DG CHEST 1V PORT
1 series · 1 of 1 positions shown · non-contrast
Comparison: Chest radiograph 06/14/2021

CLINICAL DATA: Sore chest, recent CABG

EXAM:
PORTABLE CHEST 1 VIEW

[chest ap]
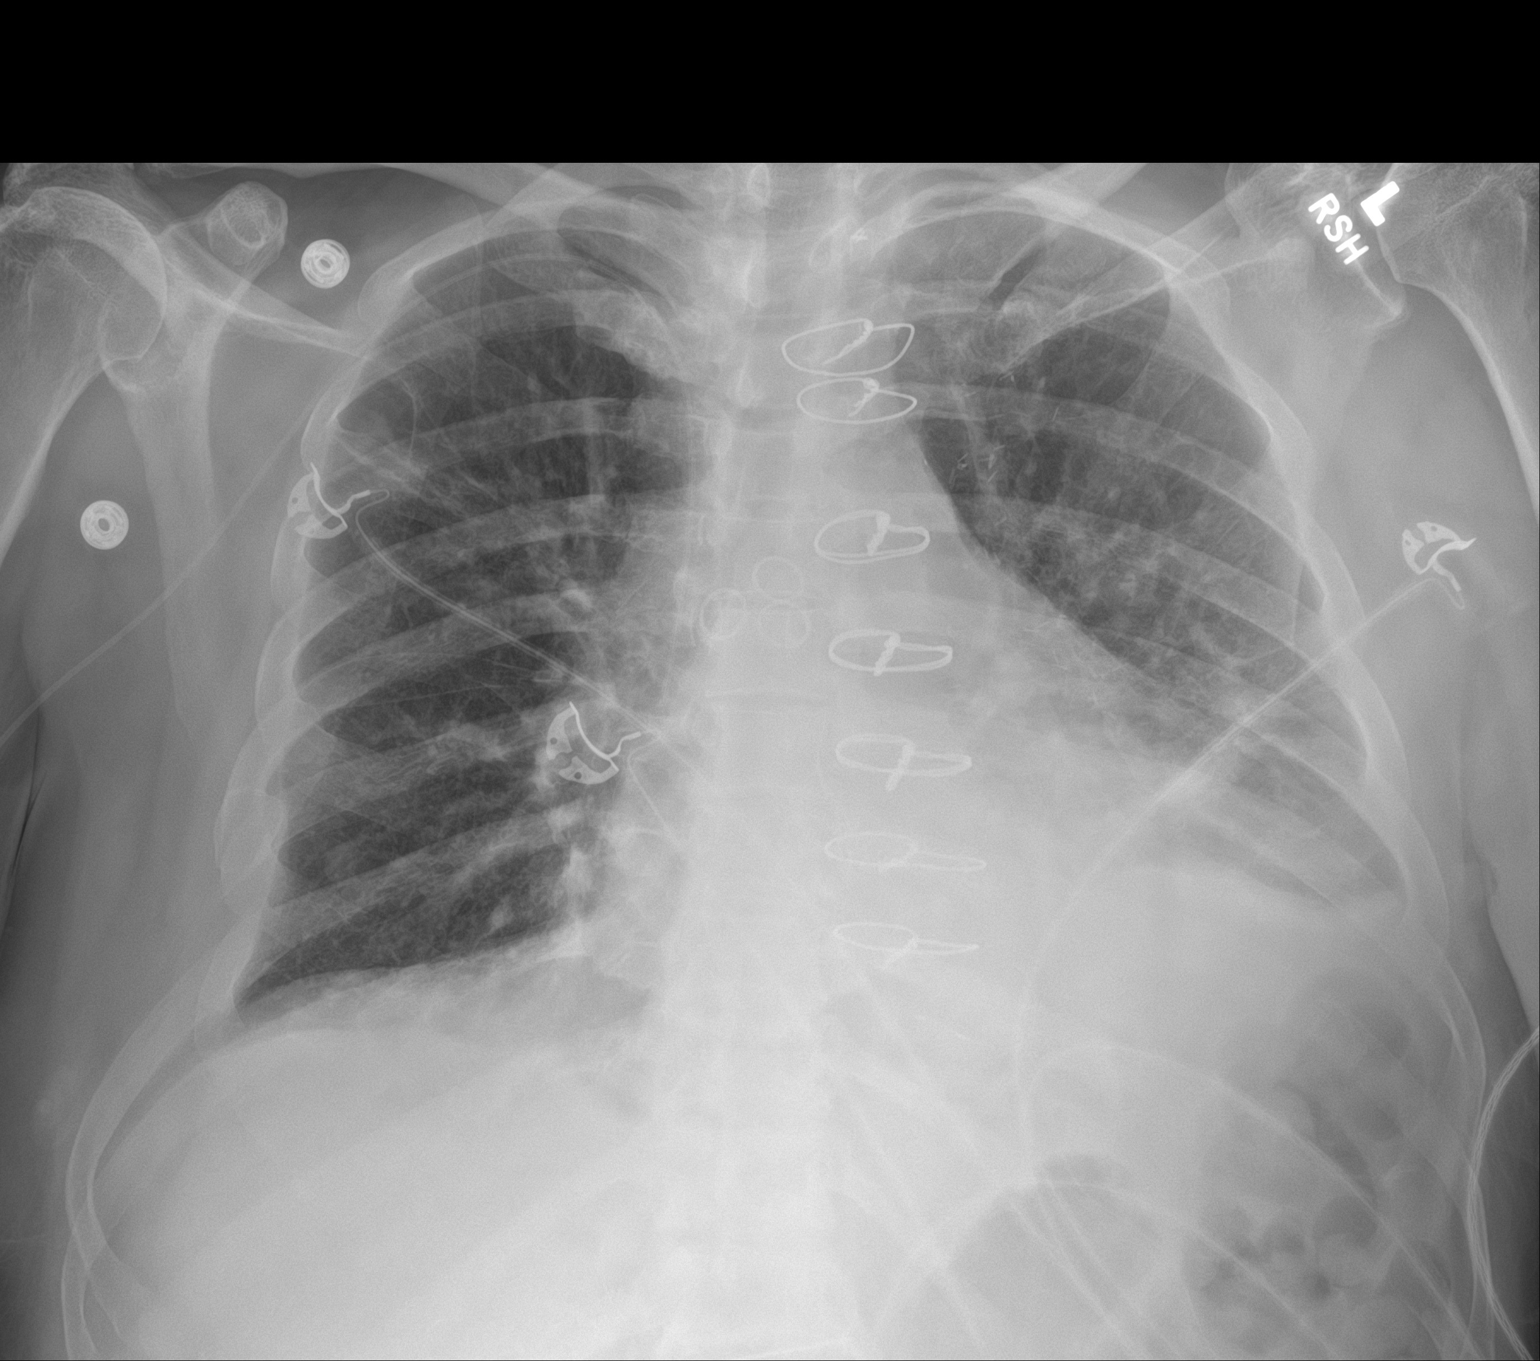

[1 of 1 positions shown; findings below may reference images not displayed]

FINDINGS: The right IJ vascular sheath has been removed. There is a new right
upper extremity PICC with the tip terminating in the lower SVC.

The cardiomediastinal silhouette is stable, with unchanged
cardiomegaly. Median sternotomy wires are again noted.

Lung volumes are low. There is a small left pleural effusion with
adjacent opacity likely reflecting atelectasis, not significantly
changed. Otherwise, there is no new or worsening focal airspace
disease. There is no significant right effusion. There is no
appreciable pneumothorax.

There is no acute osseous abnormality.
IMPRESSION: Small left pleural effusion with adjacent opacity likely reflecting
atelectasis, overall not significantly changed in the interim. No
new or worsening focal airspace disease. No appreciable
pneumothorax.

## 2024-01-21 IMAGING — DX DG CHEST 2V
2 series · 2 of 2 positions shown · non-contrast
Comparison: Previous studies including the examination of
06/15/2021

CLINICAL DATA: Postoperative state, chest pain

EXAM:
CHEST - 2 VIEW

[chest pa]
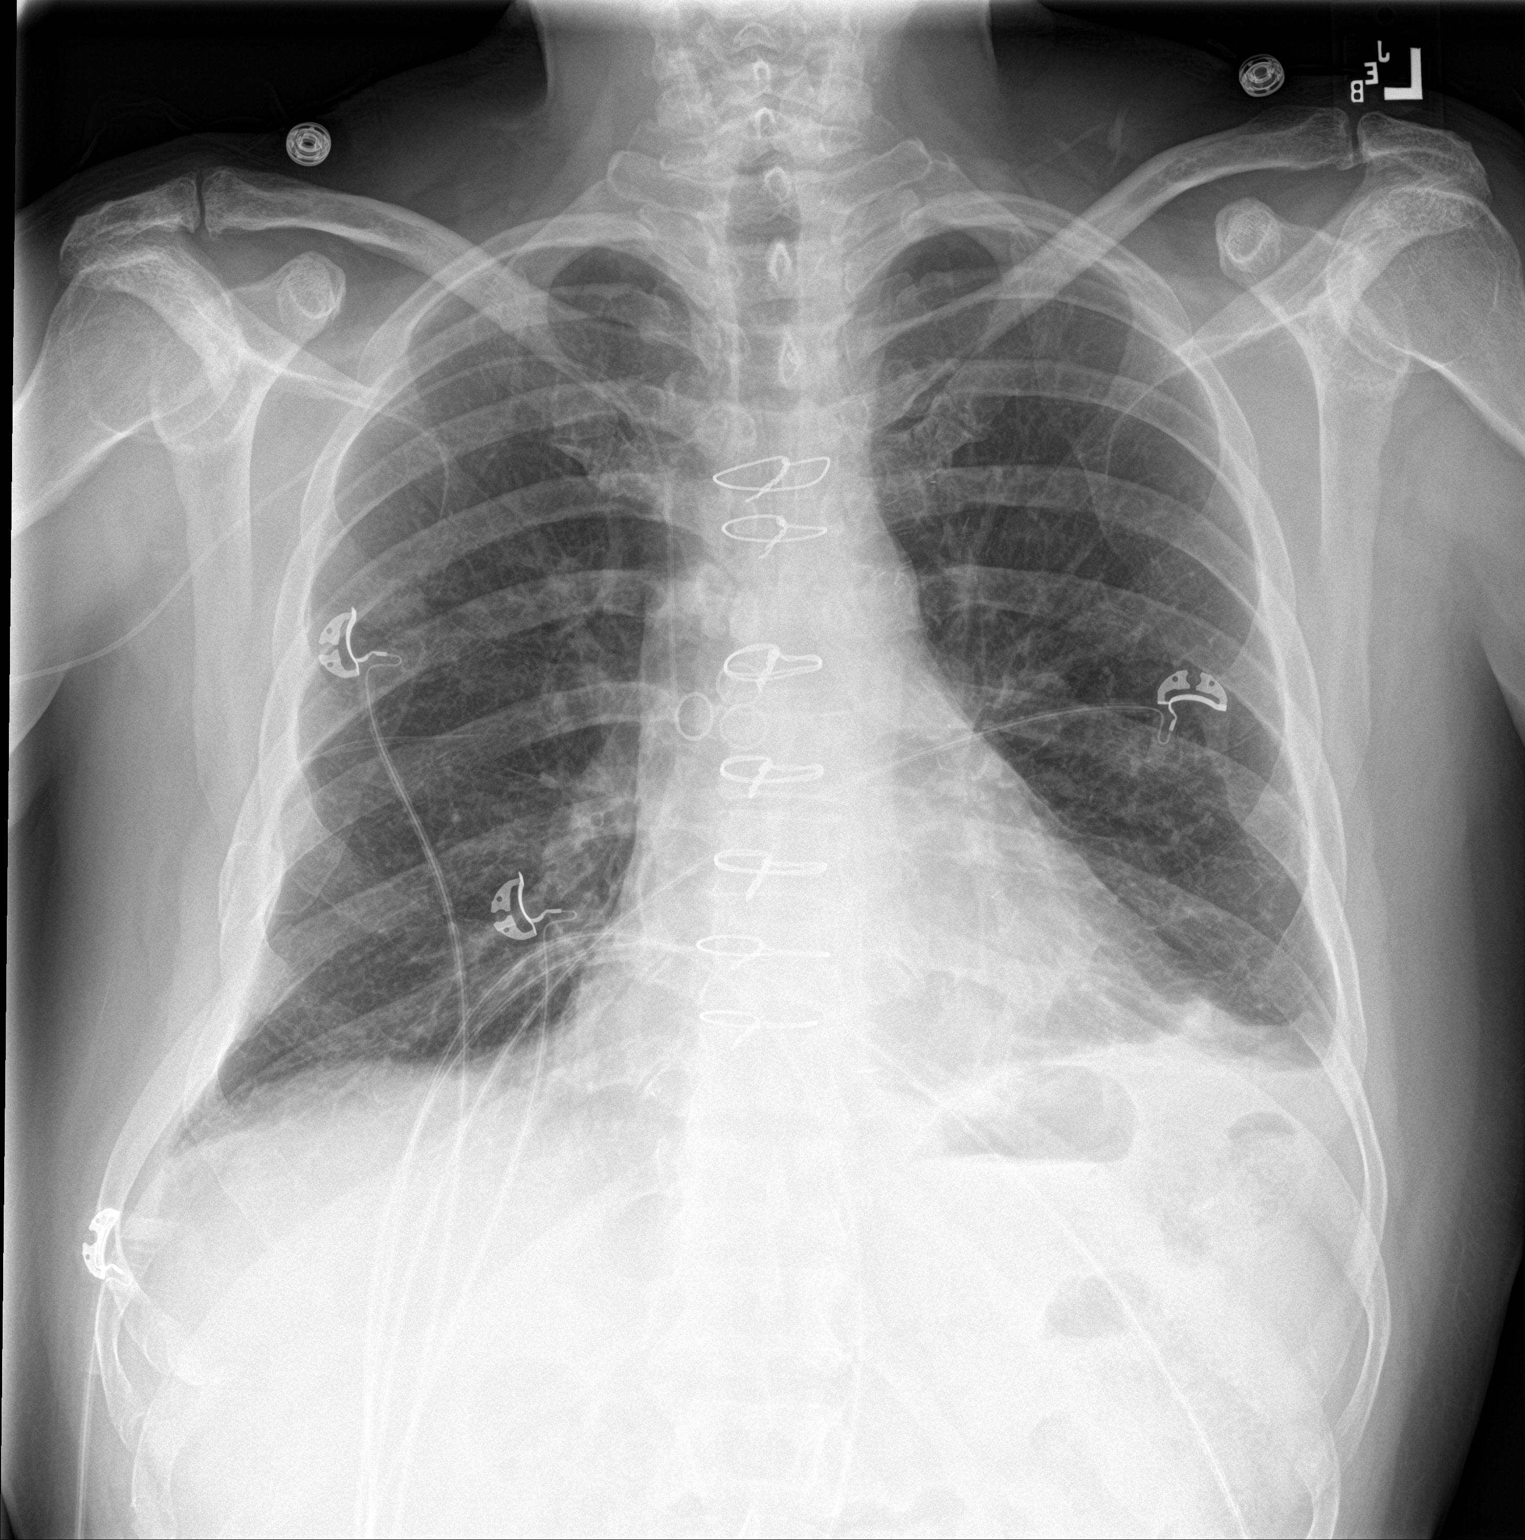

[chest lat]
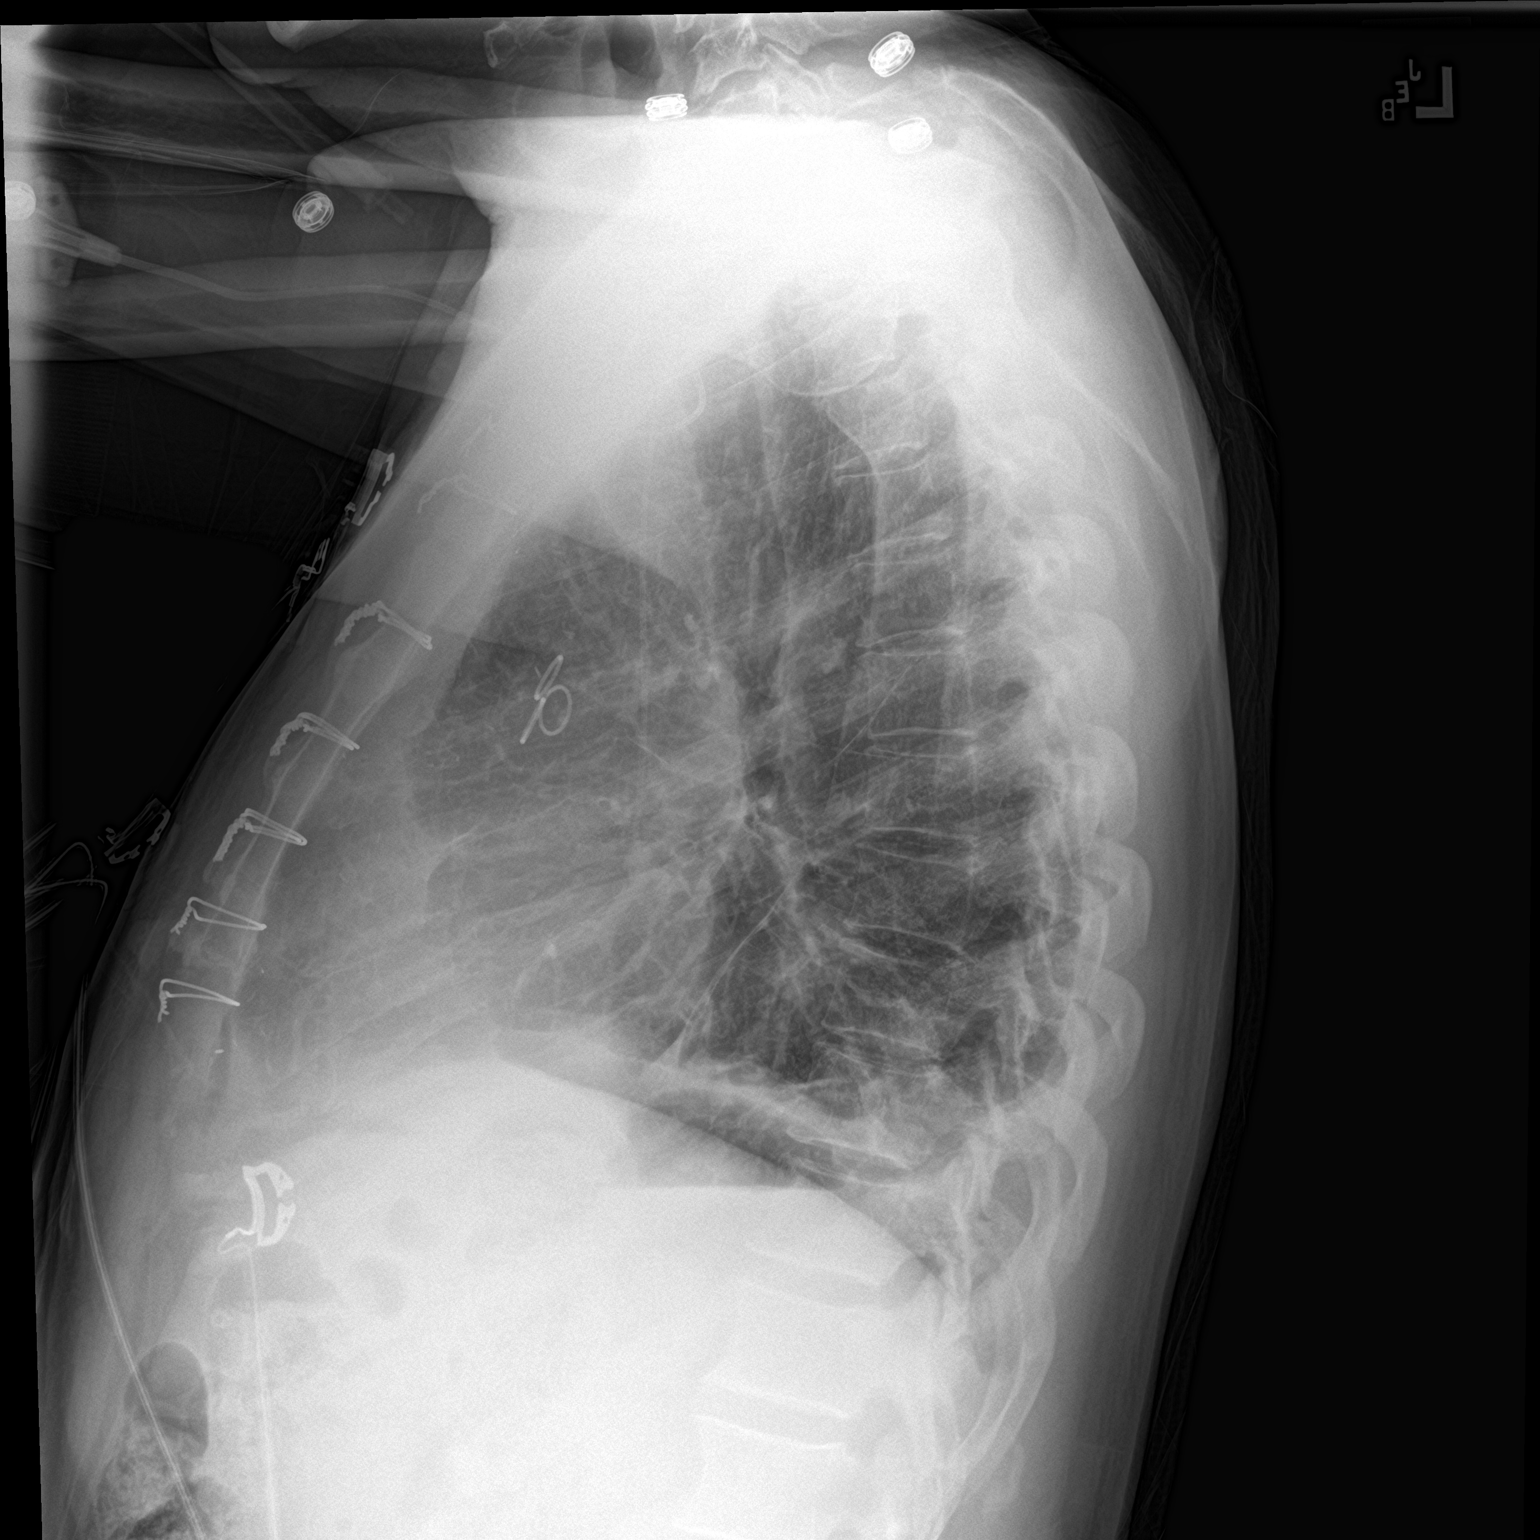

[2 of 2 positions shown; findings below may reference images not displayed]

FINDINGS: Cardiac size is within normal limits. Central pulmonary vessels are
less prominent. There is interval improvement in aeration of left
lower lung fields with small residual linear densities. There is
blunting of left lateral CP angle. There is no pneumothorax. There
is evidence of previous coronary bypass surgery. Tip of PICC line is
seen in the course of superior vena cava.
IMPRESSION: There is interval decrease in pulmonary vascular congestion and
improvement of aeration in the left lower lung fields. Residual
linear densities in the left lower lung fields suggest residual
subsegmental atelectasis. Possible small left pleural effusion.

## 2024-01-22 ENCOUNTER — Ambulatory Visit (HOSPITAL_COMMUNITY)
Admission: RE | Admit: 2024-01-22 | Discharge: 2024-01-22 | Disposition: A | Discharge status: Home / Self Care | Source: Ambulatory Visit | Attending: Vascular Surgery | Admitting: Vascular Surgery

## 2024-01-22 ENCOUNTER — Ambulatory Visit (HOSPITAL_BASED_OUTPATIENT_CLINIC_OR_DEPARTMENT_OTHER)
Admission: RE | Admit: 2024-01-22 | Discharge: 2024-01-22 | Disposition: A | Discharge status: Home / Self Care | Source: Ambulatory Visit | Attending: Vascular Surgery | Admitting: Vascular Surgery

## 2024-01-22 ENCOUNTER — Ambulatory Visit (INDEPENDENT_AMBULATORY_CARE_PROVIDER_SITE_OTHER): Admitting: Physician Assistant

## 2024-01-22 VITALS — BP 134/82 | HR 74 | Temp 97.7°F | Wt 176.9 lb

## 2024-01-22 DIAGNOSIS — I739 Peripheral vascular disease, unspecified: Secondary | ICD-10-CM | POA: Diagnosis present

## 2024-01-22 LAB — VAS US ABI WITH/WO TBI
Left ABI: 0.76
Right ABI: 0.74

## 2024-01-22 NOTE — Progress Notes (Signed)
 Office Note   History of Present Illness   Kenneth Bailey is a 58 y.o. (01-20-1966) male who presents for surveillance of PAD.  He has a history of left lower extremity angiogram with left posterior tibial artery and TP trunk angioplasty on 11/03/2022 by Dr. Gretta.  This was done for critical limb ischemia with a nonhealing left fifth toe amputation.  He then underwent partial left foot amputation by podiatry on 11/15/2022.  This was well-healed by his last office visit in January.  He returns today for follow-up.  He has no complaints at today's visit.  His previous left lateral foot amputation site is still well-healed.  He denies any new tissue loss.  He also denies any rest pain or claudication in bilateral lower extremities.  He has decreased his cigarette intake to 1/2 PPD.  Current Outpatient Medications  Medication Sig Dispense Refill   acetaminophen  (TYLENOL ) 500 MG tablet Take 1,000 mg by mouth 2 (two) times daily as needed for moderate pain (pain score 4-6), fever or headache.     ezetimibe  (ZETIA ) 10 MG tablet Take 1 tablet (10 mg total) by mouth daily. 30 tablet 1   [Paused] furosemide  (LASIX ) 20 MG tablet Take 20 mg by mouth daily as needed for fluid or edema.     ibuprofen (ADVIL) 200 MG tablet Take 400 mg by mouth 2 (two) times daily as needed for headache or moderate pain (pain score 4-6).     icosapent  Ethyl (VASCEPA ) 1 g capsule Take 2 capsules (2 g total) by mouth 2 (two) times daily. 360 capsule 3   Insulin  Syringe-Needle U-100 30G X 1/2 0.3 ML MISC Use with Novolog  90 each 0   LANTUS  SOLOSTAR 100 UNIT/ML Solostar Pen Inject 30 Units into the skin daily as needed (BS > 170). 15 mL 11   metFORMIN (GLUCOPHAGE) 1000 MG tablet Take 1,000 mg by mouth 2 (two) times daily.     omeprazole (PRILOSEC) 40 MG capsule Take 40 mg by mouth at bedtime as needed (heartburn).     REPATHA  SURECLICK 140 MG/ML SOAJ Inject 140 mg into the skin See admin instructions. Inject 140mg  into  the skin every other Monday.     traZODone (DESYREL) 50 MG tablet Take 100-150 mg by mouth at bedtime as needed for sleep.     No current facility-administered medications for this visit.    REVIEW OF SYSTEMS (negative unless checked):   Cardiac:  []  Chest pain or chest pressure? []  Shortness of breath upon activity? []  Shortness of breath when lying flat? []  Irregular heart rhythm?  Vascular:  []  Pain in calf, thigh, or hip brought on by walking? []  Pain in feet at night that wakes you up from your sleep? []  Blood clot in your veins? []  Leg swelling?  Pulmonary:  []  Oxygen at home? []  Productive cough? []  Wheezing?  Neurologic:  []  Sudden weakness in arms or legs? []  Sudden numbness in arms or legs? []  Sudden onset of difficult speaking or slurred speech? []  Temporary loss of vision in one eye? []  Problems with dizziness?  Gastrointestinal:  []  Blood in stool? []  Vomited blood?  Genitourinary:  []  Burning when urinating? []  Blood in urine?  Psychiatric:  []  Major depression  Hematologic:  []  Bleeding problems? []  Problems with blood clotting?  Dermatologic:  []  Rashes or ulcers?  Constitutional:  []  Fever or chills?  Ear/Nose/Throat:  []  Change in hearing? []  Nose bleeds? []  Sore throat?  Musculoskeletal:  []  Back pain? []   Joint pain? []  Muscle pain?   Physical Examination   Vitals:   01/22/24 1219  BP: 134/82  Pulse: 74  Temp: 97.7 F (36.5 C)  TempSrc: Temporal  Weight: 176 lb 14.4 oz (80.2 kg)   Body mass index is 26.9 kg/m.  General:  WDWN in NAD; vital signs documented above Gait: Not observed HENT: WNL, normocephalic Pulmonary: normal non-labored breathing , without rales, rhonchi,  wheezing Cardiac: regular Abdomen: soft, NT, no masses Skin: without rashes Vascular Exam/Pulses: lightly palpable DP pulses bilaterally Extremities: without ischemic changes, without gangrene , without cellulitis; without open wounds; well  healed previous lateral left foot amputation Musculoskeletal: no muscle wasting or atrophy  Neurologic: A&O X 3;  No focal weakness or paresthesias are detected Psychiatric:  The pt has Normal affect.  Non-Invasive Vascular imaging   ABI (01/22/2024) R:  ABI: 0.74 (0.59),  PT: tri DP: tri TBI:  0.44 L:  ABI: 0.76 (0.69),  PT: tri DP: tri TBI: 0.43   LLE Arterial Duplex (01/22/2024) +-----------+--------+-----+--------+----------+-------------------+  LEFT      PSV cm/sRatioStenosisWaveform  Comments             +-----------+--------+-----+--------+----------+-------------------+  CFA Mid    71                   biphasic                       +-----------+--------+-----+--------+----------+-------------------+  DFA       77                   biphasic                       +-----------+--------+-----+--------+----------+-------------------+  SFA Prox   112                  biphasic                       +-----------+--------+-----+--------+----------+-------------------+  SFA Mid    54                   triphasic                      +-----------+--------+-----+--------+----------+-------------------+  SFA Distal 36                   triphasic                      +-----------+--------+-----+--------+----------+-------------------+  POP Prox   39                   biphasic                       +-----------+--------+-----+--------+----------+-------------------+  POP Distal 23                   biphasic                       +-----------+--------+-----+--------+----------+-------------------+  ATA Prox   10                   monophasiccollateral           +-----------+--------+-----+--------+----------+-------------------+  ATA Mid  not visualized well  +-----------+--------+-----+--------+----------+-------------------+  ATA Distal 25                   monophasic                      +-----------+--------+-----+--------+----------+-------------------+  PTA Distal 32                   monophasic                     +-----------+--------+-----+--------+----------+-------------------+  PERO Distal21                   monophasic                     +-----------+--------+-----+--------+----------+-------------------+    Medical Decision Making   Daivd Fredericksen Elsayed is a 58 y.o. male who presents for surveillance of PAD  Based on the patient's vascular studies, his ABIs bilaterally appear improved.  His right ABI is 0.74 and left ABI is 0.76 Arterial duplex of the left lower extremity demonstrates a patent CFA, SFA, popliteal artery, and tibial vessels without stenosis He has a well-healed left lateral foot amputation site.  He has no recurrent tissue loss.  He denies any rest pain or claudication. On exam he has lightly palpable DP pulses bilaterally He can follow-up with our office in 1 year with repeat ABIs and LLE arterial duplex  Ahmed Holster PA-C [facility] Office: [phone]  Clinic MD: Magda

## 2024-01-29 NOTE — Progress Notes (Signed)
 Subjective The patient presents for diabetic foot examination. He has no complaints at this time.    Review of Systems  Skin:  Positive for color change.     Objective  Vitals:   01/29/24 1333  BP: 118/70  BP Site: Right Arm  BP Position: Sitting  BP Cuff Size: Regular Adult  Pulse: (!) 115  Resp: 18  SpO2: 97%  Weight: 177 lb (80.3 kg)  Height: 5' 8 (1.727 m)   Estimated body mass index is 26.91 kg/m as calculated from the following:   Height as of this encounter: 5' 8 (1.727 m).   Weight as of this encounter: 177 lb (80.3 kg). Facility age limit for growth %iles is 20 years.   Physical Exam  Diabetic Foot Exam: DM Foot exam HM updated, normal pedal  pulses, no trophic changes or ulcerative lesions, normal sensory exam, normal monofilament exam, risk score:  1, amputation, capillary refill: 3 seconds, hygiene: good, lack of hair, nail exam: normal nails without lesions and onychomycosis of the toenails, onychomycosis, and pedal pulses reduced bilateral   The 2nd right toenail is thickened, discolored yellow and mycotic.  The patient's skin color shows a generalized rubor to the feet.  Hair growth and distribution is not noted.  The skin on the feet is normal in temperature.  The skin on the feet is thin.  The dorsalis pedal pulses are :+1/4 bilateral.   The posterior tibial pulses are :  +1/4 bilateral. The 5th ray of the left foot has been amputated.     Assessment and Plan  B35.1 Onychomycosis  (primary encounter diagnosis)  E11.65,Z79.4 Type 2 diabetes mellitus with hyperglycemia, with long-term current use of insulin  (CMS & HHS-HCC)    Procedures                           We discussed the condition of the patient's feet.  The 2nd right toenail was debrided.

## 2024-02-13 ENCOUNTER — Other Ambulatory Visit (HOSPITAL_COMMUNITY): Payer: Self-pay

## 2024-02-26 ENCOUNTER — Encounter: Payer: Self-pay | Admitting: Podiatry

## 2024-02-26 ENCOUNTER — Ambulatory Visit: Admitting: Podiatry

## 2024-02-26 DIAGNOSIS — M79675 Pain in left toe(s): Secondary | ICD-10-CM | POA: Diagnosis not present

## 2024-02-26 DIAGNOSIS — Z89422 Acquired absence of other left toe(s): Secondary | ICD-10-CM

## 2024-02-26 DIAGNOSIS — M79674 Pain in right toe(s): Secondary | ICD-10-CM | POA: Diagnosis not present

## 2024-02-26 DIAGNOSIS — L97522 Non-pressure chronic ulcer of other part of left foot with fat layer exposed: Secondary | ICD-10-CM

## 2024-02-26 DIAGNOSIS — E1151 Type 2 diabetes mellitus with diabetic peripheral angiopathy without gangrene: Secondary | ICD-10-CM

## 2024-02-26 DIAGNOSIS — B351 Tinea unguium: Secondary | ICD-10-CM

## 2024-02-26 MED ORDER — MUPIROCIN 2 % EX OINT: 1.0000 | TOPICAL_OINTMENT | Freq: Two times a day (BID) | CUTANEOUS | 2 refills | Status: AC

## 2024-02-26 NOTE — Progress Notes (Signed)
 Chief Complaint  Patient presents with   [facility]    [facility] with callous, lateral side of left foot may be tring to ulcerate.  A1c 7.9 in Aug No anticoag.     HPI: 58 y.o. male presents today for diabetic footcare.  Remaining toenails are thickened, dystrophic and painful for the patient.  He has difficulty maintaining them himself due to their thickness and length and due to mobility issues.  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  Does have concern for recurrent ulceration at the base of the fourth toe today that is currently callused over.  He reports pain in this area over the last 1 to 2 weeks.  Past Medical History:  Diagnosis Date   Acute combined systolic and diastolic heart failure (HCC)    Acute systolic heart failure (HCC) 06/06/2021   Anemia    Atherosclerotic heart disease 09/07/2021   CHF (congestive heart failure) (HCC)    Chronic systolic CHF (congestive heart failure) (HCC) 11/23/2022   COPD (chronic obstructive pulmonary disease) (HCC)    Diabetes mellitus, type 2 (HCC)    Gastric ulcer    HLD (hyperlipidemia)    Hypertension    Hypertension associated with diabetes (HCC) 09/29/2022   Hypertensive heart disease with congestive heart failure (HCC) 03/28/2023   Kidney disease, chronic, stage III (GFR 30-59 ml/min) (HCC)    Multiple duodenal ulcers    Orthostatic hypotension    Osteomyelitis (HCC)    Peripheral arterial disease    Postural hypotension 12/20/2016   PUD (peptic ulcer disease)    Tobacco abuse     Past Surgical History:  Procedure Laterality Date   ABDOMINAL AORTOGRAM W/LOWER EXTREMITY N/A 11/03/2022   Procedure: ABDOMINAL AORTOGRAM W/LOWER EXTREMITY;  Surgeon: Gretta Lonni PARAS, MD;  [facility];  Service: Cardiovascular;  Laterality: N/A;   AMPUTATION Left 10/31/2022   Procedure: AMPUTATION LEFT FIFTH TOE;  Surgeon: Joya Stabs, DPM;  [facility]: [facility] OR;  Service: Podiatry;  Laterality: Left;   AMPUTATION Left  11/15/2022   Procedure: AMPUTATION FITH RAY;  Surgeon: Janit Thresa HERO, DPM;  [facility] OR;  Service: Podiatry;  Laterality: Left;   AMPUTATION TOE Left 10/31/2022   Procedure: IRRIGATION AND DEBRIDEMENTOF LEFT FOOT;  Surgeon: Joya Stabs, DPM;  [facility];  Service: Podiatry;  Laterality: Left;   BIOPSY  11/05/2022   Procedure: BIOPSY;  Surgeon: Rollin Dover, MD;  Location: [facility] ENDOSCOPY;  Service: Gastroenterology;;   CORONARY ARTERY BYPASS GRAFT N/A 06/10/2021   Procedure: CORONARY ARTERY BYPASS GRAFTING (CABG) TIMES FOUR, USING LEFT INTERNAL MAMMARY ARTERY AND RIGHT GREATER SAPHENOUS VEIN HARVESTED ENDOSCOPICALLY;  Surgeon: Obadiah Coy, MD;  Location: [facility] OR;  Service: Open Heart Surgery;  Laterality: N/A;   ENDOVEIN HARVEST OF GREATER SAPHENOUS VEIN Right 06/10/2021   Procedure: ENDOVEIN HARVEST OF GREATER SAPHENOUS VEIN;  Surgeon: Obadiah Coy, MD;  [facility];  Service: Open Heart Surgery;  Laterality: Right;   ESOPHAGOGASTRODUODENOSCOPY N/A 11/24/2022   Procedure: ESOPHAGOGASTRODUODENOSCOPY (EGD);  Surgeon: Wilhelmenia Aloha Raddle., MD;  Location: [facility] ENDOSCOPY;  Service: Gastroenterology;  Laterality: N/A;   ESOPHAGOGASTRODUODENOSCOPY (EGD) WITH PROPOFOL  N/A 11/05/2022   Procedure: ESOPHAGOGASTRODUODENOSCOPY (EGD) WITH PROPOFOL ;  Surgeon: Rollin Dover, MD;  Location: [facility] ENDOSCOPY;  Service: Gastroenterology;  Laterality: N/A;   HEMOSTASIS CLIP PLACEMENT  11/24/2022   Procedure: HEMOSTASIS CLIP PLACEMENT;  Surgeon: Wilhelmenia Aloha Raddle., MD;  Location: [facility] ENDOSCOPY;  Service: Gastroenterology;;   HEMOSTASIS CONTROL  11/24/2022  Procedure: HEMOSTASIS CONTROL;  Surgeon: Wilhelmenia Aloha Raddle., MD;  Location: [facility] ENDOSCOPY;  Service: Gastroenterology;;   HOT HEMOSTASIS N/A 11/24/2022   Procedure: HOT HEMOSTASIS (ARGON PLASMA COAGULATION/BICAP);  Surgeon: Wilhelmenia Aloha Raddle., MD;  Location: [facility] ENDOSCOPY;  Service: Gastroenterology;  Laterality: N/A;   I & D EXTREMITY Left  11/08/2022   Procedure: IRRIGATION AND DEBRIDEMENT OF FOOT AND WASHOUT;  Surgeon: Gershon Donnice SAUNDERS, DPM;  [facility];  Service: Podiatry;  Laterality: Left;   PERIPHERAL VASCULAR BALLOON ANGIOPLASTY  11/03/2022   Procedure: PERIPHERAL VASCULAR BALLOON ANGIOPLASTY;  Surgeon: Gretta Lonni PARAS, MD;  [facility];  Service: Cardiovascular;;   RIGHT/LEFT HEART CATH AND CORONARY ANGIOGRAPHY N/A 06/06/2021   Procedure: RIGHT/LEFT HEART CATH AND CORONARY ANGIOGRAPHY;  Surgeon: Cherrie Toribio SAUNDERS, MD;  [facility];  Service: Cardiovascular;  Laterality: N/A;   TEE WITHOUT CARDIOVERSION N/A 06/10/2021   Procedure: TRANSESOPHAGEAL ECHOCARDIOGRAM (TEE);  Surgeon: Obadiah Coy, MD;  Location: [facility] OR;  Service: Open Heart Surgery;  Laterality: N/A;    Allergies  Allergen Reactions   Neurontin  [Gabapentin ] Other (See Comments)    Concern for causing Alzheimer's/dementia    Statins Swelling    Leg swelling   Zestril [Lisinopril] Swelling    Leg swelling    ROS denies any nausea, vomit, fever, chills, chest pain, shortness of breath   Physical Exam: There were no vitals filed for this visit.  General: The patient is alert and oriented x3 in no acute distress.  Dermatology: Pedal skin atrophic.  Nailplates x 9 are thickened, elongated, dystrophic with yellow discoloration and some of the debris, greater than 3 mm thickening.  The affected nail plates are tender on direct dorsal palpation.  Lateral base of left fourth toe there is hyperkeratotic lesion concerning for reulceration in this area.  Did debride the overlying callus, underlying ulceration present measuring about 0.4 x 0.3 x 0.3 cm without surrounding erythema, fibrogranular base present, mild bleeding present.  No erythema, no edema, no odor, no purulence.  Minimal to no serous exudate.    Vascular: DP and PT pulses left foot nonpalpable.  Capillary refill intact to the digits.  Decreased pedal hair  growth.  Neurological: Light touch sensation grossly intact bilateral feet. Protective sensation decreased  Musculoskeletal Exam: Prior left foot partial fifth ray and fourth metatarsal resection.  Radiographic Exam:  Normal osseous mineralization. Joint spaces preserved.  No fractures or osseous irregularities noted.  Assessment/Plan of Care: 1. Ulcer of left foot with fat layer exposed (HCC)   2. Type 2 diabetes mellitus with diabetic peripheral angiopathy without gangrene, unspecified whether long term insulin  use (HCC)   3. 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 Pain due to onychomycosis of toenails of both feet      Meds ordered this encounter  Medications   mupirocin  ointment (BACTROBAN ) 2 %    Sig: Apply 1 Application topically 2 (two) times daily.    Dispense:  30 g    Refill:  2   None  Discussed clinical findings with patient today.  #Onychomycosis with pain  -Nails palliatively debrided as below. -Educated on self-care -Q7 high risk foot care  Procedure: Nail Debridement Rationale: Pain Type of Debridement: manual, sharp debridement. Instrumentation: Nail nipper, rotary burr. Number of Nails: 9  # Recurrence of left fourth toe ulceration lateral aspect with fat layer exposed - Ulceration noted when paring overlying callus with #15 blade. - Cleansed with wound  cleanser.  Dressed with antibacterial ointment and bandage.  Offloading  felt padding applied. -Prism  order placed for patient.  Apply collagen packing to the wound and small amount of mupirocin  ointment.  Cover with bandage.  Continue to offload - Monitor for signs of recurrent infection. - X-rays at follow-up - Does actually have recent ABIs and arterial duplex obtained recently, early November.  Moderate PAD disease on ABIs.  Duplex showing no significant stenosis however lower extremity circulation biphasic above the knee, monophasic within the tibials in the peroneal vessels  with a AT not well-visualized. - May benefit from vascular referral  due to recurrence of the ulceration -Other comorbidities concerning for wound healing include CKD, heart failure, tobacco use, COPD -I certify that this diagnosis represents a distinct and separate diagnosis that requires evaluation and treatment separate from other procedures or diagnosis - Follow-up in 2 weeks for wound check.   Patient educated on diabetes. Discussed proper diabetic foot care and discussed risks and complications of disease. Educated patient in depth on reasons to return to the office immediately should he/she discover anything concerning or new on the feet. All questions answered. Discussed proper shoes as well.        Ketura Sirek L. Lamount MAUL, AACFAS [facility]     [address] [place], [place] [address]                Office [phone]  Fax [phone]

## 2024-02-27 ENCOUNTER — Other Ambulatory Visit (HOSPITAL_BASED_OUTPATIENT_CLINIC_OR_DEPARTMENT_OTHER): Payer: Self-pay | Admitting: Internal Medicine

## 2024-02-27 DIAGNOSIS — I739 Peripheral vascular disease, unspecified: Secondary | ICD-10-CM

## 2024-02-27 DIAGNOSIS — E782 Mixed hyperlipidemia: Secondary | ICD-10-CM

## 2024-02-27 DIAGNOSIS — Z951 Presence of aortocoronary bypass graft: Secondary | ICD-10-CM

## 2024-02-27 DIAGNOSIS — I2510 Atherosclerotic heart disease of native coronary artery without angina pectoris: Secondary | ICD-10-CM

## 2024-02-27 NOTE — Progress Notes (Unsigned)
 [facility]  [address] [place],  [place]  [address] [phone]  Clinic Day:  02/27/2024  Referring physician: Lonna Leonor HERO, NP   HISTORY OF PRESENT ILLNESS:  The patient is a 58 y.o. male with anemia secondary to multiple factors, including iron  deficiency from GI blood loss and chronic renal insufficiency.   In July 2024, an EGD was done, which revealed a duodenal ulcer and a nonbleeding visible vessel in the same area.  This area was cauterized.  With respect to his anemia, he did require blood transfusions in 2024.  He last received IV iron  in September 2024.  He comes in today for routine follow-up.  Since his last visit, the patient has been doing fairly well.  He denies having any overt forms of GI blood loss or increased fatigue which concerns him for worsening anemia.  PHYSICAL EXAM:  There were no vitals taken for this visit. Wt Readings from Last 3 Encounters:  01/22/24 176 lb 14.4 oz (80.2 kg)  12/10/23 181 lb 7 oz (82.3 kg)  11/13/23 177 lb (80.3 kg)   There is no height or weight on file to calculate BMI. Performance status (ECOG): 1 - Symptomatic but completely ambulatory Physical Exam Constitutional:      Appearance: Normal appearance. He is not ill-appearing.  HENT:     Mouth/Throat:     Mouth: Mucous membranes are moist.     Pharynx: Oropharynx is clear. No oropharyngeal exudate or posterior oropharyngeal erythema.  Cardiovascular:     Rate and Rhythm: Normal rate and regular rhythm.     Heart sounds: No murmur heard.    No friction rub. No gallop.  Pulmonary:     Effort: Pulmonary effort is normal. No respiratory distress.     Breath sounds: Normal breath sounds. No wheezing, rhonchi or rales.  Abdominal:     General: Bowel sounds are normal. There is no distension.     Palpations: Abdomen is soft. There is no mass.     Tenderness: There is no abdominal tenderness.  Musculoskeletal:        General: No swelling.      Right lower leg: No edema.     Left lower leg: No edema.  Lymphadenopathy:     Cervical: No cervical adenopathy.     Upper Body:     Right upper body: No supraclavicular or axillary adenopathy.     Left upper body: No supraclavicular or axillary adenopathy.     Lower Body: No right inguinal adenopathy. No left inguinal adenopathy.  Skin:    General: Skin is warm.     Coloration: Skin is not jaundiced.     Findings: No lesion or rash.  Neurological:     General: No focal deficit present.     Mental Status: He is alert and oriented to person, place, and time. Mental status is at baseline.  Psychiatric:        Mood and Affect: Mood normal.        Behavior: Behavior normal.        Thought Content: Thought content normal.     LABS:      Latest Ref Rng & Units 12/10/2023    9:22 AM 12/09/2023    4:24 AM 12/08/2023   11:47 AM  CBC  WBC 4.0 - 10.5 K/uL 8.1  8.9    Hemoglobin 13.0 - 17.0 g/dL 86.3  87.3  86.3   Hematocrit 39.0 - 52.0 % 41.2  37.6  40.0   Platelets 150 - 400 K/uL 204  198        Latest Ref Rng & Units 12/09/2023    4:24 AM 12/08/2023   11:47 AM 12/08/2023    9:02 AM  CMP  Glucose 70 - 99 mg/dL 786     BUN 6 - 20 mg/dL 27     Creatinine 9.38 - 1.24 mg/dL 8.77     Sodium 864 - 854 mmol/L 135  140  141   Potassium 3.5 - 5.1 mmol/L 3.9  4.6  3.9   Chloride 98 - 111 mmol/L 96     CO2 22 - 32 mmol/L 29     Calcium  8.9 - 10.3 mg/dL 9.1     Total Protein 6.5 - 8.1 g/dL 6.0     Total Bilirubin 0.0 - 1.2 mg/dL 0.3     Alkaline Phos 38 - 126 U/L 110     AST 15 - 41 U/L 14     ALT 0 - 44 U/L 17       Latest Reference Range & Units 08/29/23 09:13 08/29/23 09:14  Iron  45 - 182 ug/dL  78  UIBC ug/dL  671  TIBC 749 - 549 ug/dL  593  Saturation Ratios 17.9 - 39.5 %  19  Ferritin 24 - 336 ng/mL  34  Folate >5.9 ng/mL  9.5  Vitamin B12 180 - 914 pg/mL 231      ASSESSMENT & PLAN:  Assessment/Plan:  A 58 y.o. male with anemia secondary to iron  deficiency from previous  GI blood loss, as well as mild renal insufficiency.  I am very pleased with his hemoglobin of 15.8 today.  His iron  parameters also look fine.  Clinically, he is doing better.  As he is stable from a hematologic standpoint, I will see him back in 6 months for repeat clinical assessment. The patient understands all the plans discussed today and is in agreement with them.    Jamarie Joplin DELENA Kerns, MD

## 2024-02-28 ENCOUNTER — Other Ambulatory Visit (HOSPITAL_COMMUNITY): Payer: Self-pay

## 2024-02-28 ENCOUNTER — Inpatient Hospital Stay: Attending: Oncology | Admitting: Oncology

## 2024-02-28 ENCOUNTER — Telehealth: Payer: Self-pay | Admitting: Pharmacy Technician

## 2024-02-28 ENCOUNTER — Inpatient Hospital Stay

## 2024-02-28 ENCOUNTER — Telehealth: Payer: Self-pay

## 2024-02-28 VITALS — BP 166/95 | HR 93 | Temp 97.9°F | Resp 16 | Ht 68.0 in | Wt 181.0 lb

## 2024-02-28 DIAGNOSIS — D500 Iron deficiency anemia secondary to blood loss (chronic): Secondary | ICD-10-CM | POA: Insufficient documentation

## 2024-02-28 DIAGNOSIS — N182 Chronic kidney disease, stage 2 (mild): Secondary | ICD-10-CM | POA: Insufficient documentation

## 2024-02-28 DIAGNOSIS — K922 Gastrointestinal hemorrhage, unspecified: Secondary | ICD-10-CM | POA: Insufficient documentation

## 2024-02-28 LAB — CMP (CANCER CENTER ONLY)
ALT: 17 U/L (ref 0–44)
AST: 20 U/L (ref 15–41)
Albumin: 4.2 g/dL (ref 3.5–5.0)
Alkaline Phosphatase: 119 U/L (ref 38–126)
Anion gap: 13 (ref 5–15)
BUN: 24 mg/dL — ABNORMAL HIGH (ref 6–20)
CO2: 26 mmol/L (ref 22–32)
Calcium: 9.8 mg/dL (ref 8.9–10.3)
Chloride: 98 mmol/L (ref 98–111)
Creatinine: 1.36 mg/dL — ABNORMAL HIGH (ref 0.61–1.24)
GFR, Estimated: >60 mL/min (ref >60)
Glucose, Bld: 284 mg/dL — ABNORMAL HIGH (ref 70–99)
Potassium: 4.4 mmol/L (ref 3.5–5.1)
Sodium: 137 mmol/L (ref 135–145)
Total Bilirubin: 0.3 mg/dL (ref 0.0–1.2)
Total Protein: 7.4 g/dL (ref 6.5–8.1)

## 2024-02-28 LAB — CBC WITH DIFFERENTIAL (CANCER CENTER ONLY)
Abs Immature Granulocytes: 0.02 K/uL (ref 0.00–0.07)
Basophils Absolute: 0.1 K/uL (ref 0.0–0.1)
Basophils Relative: 1 %
Eosinophils Absolute: 0.1 K/uL (ref 0.0–0.5)
Eosinophils Relative: 1 %
HCT: 42.5 % (ref 39.0–52.0)
Hemoglobin: 14.3 g/dL (ref 13.0–17.0)
Immature Granulocytes: 0 %
Lymphocytes Relative: 22 %
Lymphs Abs: 2 K/uL (ref 0.7–4.0)
MCH: 28 pg (ref 26.0–34.0)
MCHC: 33.6 g/dL (ref 30.0–36.0)
MCV: 83.2 fL (ref 80.0–100.0)
Monocytes Absolute: 0.6 K/uL (ref 0.1–1.0)
Monocytes Relative: 6 %
Neutro Abs: 6.2 K/uL (ref 1.7–7.7)
Neutrophils Relative %: 70 %
Platelet Count: 242 K/uL (ref 150–400)
RBC: 5.11 MIL/uL (ref 4.22–5.81)
RDW: 13.4 % (ref 11.5–15.5)
WBC Count: 8.9 K/uL (ref 4.0–10.5)
nRBC: 0 % (ref 0.0–0.2)

## 2024-02-28 LAB — IRON AND TIBC
Iron: 54 ug/dL (ref 45–182)
Saturation Ratios: 15 % — ABNORMAL LOW (ref 17.9–39.5)
TIBC: 353 ug/dL (ref 250–450)
UIBC: 299 ug/dL

## 2024-02-28 LAB — FERRITIN: Ferritin: 69 ng/mL (ref 24–336)

## 2024-02-28 NOTE — Telephone Encounter (Signed)
 Calling patient to discuss not using collegen due to cost.

## 2024-02-28 NOTE — Telephone Encounter (Signed)
 Pharmacy Patient Advocate Encounter   Received notification from CoverMyMeds that prior authorization for repatha  is required/requested.   Insurance verification completed.   The patient is insured through [employer] (COMMERCIAL).   Per test claim: PA required; PA submitted to above mentioned insurance via Latent Key/confirmation #/EOC [id number] Status is pending

## 2024-02-29 NOTE — Telephone Encounter (Signed)
 Sent over additional records

## 2024-03-03 ENCOUNTER — Other Ambulatory Visit (HOSPITAL_COMMUNITY): Payer: Self-pay

## 2024-03-03 NOTE — Telephone Encounter (Signed)
 Pharmacy Patient Advocate Encounter  Received notification from [facility] [employer] (COMMERCIAL) that Prior Authorization for repatha  has been APPROVED from 02/29/24 to 02/28/25. Ran test claim, Copay is $4.00- one month. This test claim was processed through [facility]- copay amounts may vary at other pharmacies due to pharmacy/plan contracts, or as the patient moves through the different stages of their insurance plan.   PA #/Case ID/Reference #: [id number]

## 2024-03-18 ENCOUNTER — Encounter: Payer: Self-pay | Admitting: Podiatry

## 2024-03-18 ENCOUNTER — Ambulatory Visit: Admitting: Podiatry

## 2024-03-18 ENCOUNTER — Ambulatory Visit (INDEPENDENT_AMBULATORY_CARE_PROVIDER_SITE_OTHER)

## 2024-03-18 DIAGNOSIS — L97522 Non-pressure chronic ulcer of other part of left foot with fat layer exposed: Secondary | ICD-10-CM

## 2024-03-18 DIAGNOSIS — E1151 Type 2 diabetes mellitus with diabetic peripheral angiopathy without gangrene: Secondary | ICD-10-CM | POA: Diagnosis not present

## 2024-03-18 DIAGNOSIS — Z89422 Acquired absence of other left toe(s): Secondary | ICD-10-CM

## 2024-03-18 NOTE — Progress Notes (Signed)
 Chief Complaint  Patient presents with   Wound Check    L ulcer lateral side of 5th met. Closed. Scabbed. No redness around. Diabetic A1c   81 mg Asprin occasionally    HPI: 58 y.o. male presents for ulcer check at the base of the left 4th toe lateral aspect.  History of right partial fifth ray and partial fourth metatarsal resection on the left foot.  History of diabetes and PAD.  He believes that the ulceration site is doing well.  Appears callused over.  He denies any pain to the area and has not noticed any drainage or signs of infection.  He is recently gotten new diabetic shoes.  Past Medical History:  Diagnosis Date   Acute combined systolic and diastolic heart failure (HCC)    Acute systolic heart failure (HCC) 06/06/2021   Anemia    Atherosclerotic heart disease 09/07/2021   CHF (congestive heart failure) (HCC)    Chronic systolic CHF (congestive heart failure) (HCC) 11/23/2022   COPD (chronic obstructive pulmonary disease) (HCC)    Diabetes mellitus, type 2 (HCC)    Gastric ulcer    HLD (hyperlipidemia)    Hypertension    Hypertension associated with diabetes (HCC) 09/29/2022   Hypertensive heart disease with congestive heart failure (HCC) 03/28/2023   Kidney disease, chronic, stage III (GFR 30-59 ml/min) (HCC)    Multiple duodenal ulcers    Orthostatic hypotension    Osteomyelitis (HCC)    Peripheral arterial disease    Postural hypotension 12/20/2016   PUD (peptic ulcer disease)    Tobacco abuse     Past Surgical History:  Procedure Laterality Date   ABDOMINAL AORTOGRAM W/LOWER EXTREMITY N/A 11/03/2022   Procedure: ABDOMINAL AORTOGRAM W/LOWER EXTREMITY;  Surgeon: Gretta Lonni PARAS, MD;  [facility];  Service: Cardiovascular;  Laterality: N/A;   AMPUTATION Left 10/31/2022   Procedure: AMPUTATION LEFT FIFTH TOE;  Surgeon: Joya Stabs, DPM;  [facility] OR;  Service: Podiatry;  Laterality: Left;   AMPUTATION Left 11/15/2022   Procedure:  AMPUTATION FITH RAY;  Surgeon: Janit Thresa HERO, DPM;  [facility]: [facility];  Service: Podiatry;  Laterality: Left;   AMPUTATION TOE Left 10/31/2022   Procedure: IRRIGATION AND DEBRIDEMENTOF LEFT FOOT;  Surgeon: Joya Stabs, DPM;  [facility];  Service: Podiatry;  Laterality: Left;   BIOPSY  11/05/2022   Procedure: BIOPSY;  Surgeon: Rollin Dover, MD;  Location: [facility] ENDOSCOPY;  Service: Gastroenterology;;   CORONARY ARTERY BYPASS GRAFT N/A 06/10/2021   Procedure: CORONARY ARTERY BYPASS GRAFTING (CABG) TIMES FOUR, USING LEFT INTERNAL MAMMARY ARTERY AND RIGHT GREATER SAPHENOUS VEIN HARVESTED ENDOSCOPICALLY;  Surgeon: Obadiah Coy, MD;  Location: [facility] OR;  Service: Open Heart Surgery;  Laterality: N/A;   ENDOVEIN HARVEST OF GREATER SAPHENOUS VEIN Right 06/10/2021   Procedure: ENDOVEIN HARVEST OF GREATER SAPHENOUS VEIN;  Surgeon: Obadiah Coy, MD;  [facility];  Service: Open Heart Surgery;  Laterality: Right;   ESOPHAGOGASTRODUODENOSCOPY N/A 11/24/2022   Procedure: ESOPHAGOGASTRODUODENOSCOPY (EGD);  Surgeon: Wilhelmenia Aloha Raddle., MD;  Location: [facility] ENDOSCOPY;  Service: Gastroenterology;  Laterality: N/A;   ESOPHAGOGASTRODUODENOSCOPY (EGD) WITH PROPOFOL  N/A 11/05/2022   Procedure: ESOPHAGOGASTRODUODENOSCOPY (EGD) WITH PROPOFOL ;  Surgeon: Rollin Dover, MD;  Location: [facility] ENDOSCOPY;  Service: Gastroenterology;  Laterality: N/A;   HEMOSTASIS CLIP PLACEMENT  11/24/2022   Procedure: HEMOSTASIS CLIP PLACEMENT;  Surgeon: Wilhelmenia Aloha Raddle., MD;  Location: [facility] ENDOSCOPY;  Service: Gastroenterology;;   HEMOSTASIS CONTROL  11/24/2022   Procedure: HEMOSTASIS CONTROL;  Surgeon: Wilhelmenia Aloha Raddle., MD;  Location: [facility] ENDOSCOPY;  Service: Gastroenterology;;   HOT HEMOSTASIS N/A 11/24/2022   Procedure: HOT HEMOSTASIS (ARGON PLASMA COAGULATION/BICAP);  Surgeon: Wilhelmenia Aloha Raddle., MD;  Location: [facility] ENDOSCOPY;  Service: Gastroenterology;  Laterality: N/A;   I & D EXTREMITY Left 11/08/2022   Procedure:  IRRIGATION AND DEBRIDEMENT OF FOOT AND WASHOUT;  Surgeon: Gershon Donnice SAUNDERS, DPM;  [facility];  Service: Podiatry;  Laterality: Left;   PERIPHERAL VASCULAR BALLOON ANGIOPLASTY  11/03/2022   Procedure: PERIPHERAL VASCULAR BALLOON ANGIOPLASTY;  Surgeon: Gretta Lonni PARAS, MD;  [facility];  Service: Cardiovascular;;   RIGHT/LEFT HEART CATH AND CORONARY ANGIOGRAPHY N/A 06/06/2021   Procedure: RIGHT/LEFT HEART CATH AND CORONARY ANGIOGRAPHY;  Surgeon: Cherrie Toribio SAUNDERS, MD;  [facility];  Service: Cardiovascular;  Laterality: N/A;   TEE WITHOUT CARDIOVERSION N/A 06/10/2021   Procedure: TRANSESOPHAGEAL ECHOCARDIOGRAM (TEE);  Surgeon: Obadiah Coy, MD;  Location: [facility] OR;  Service: Open Heart Surgery;  Laterality: N/A;    Allergies  Allergen Reactions   Neurontin  [Gabapentin ] Other (See Comments)    Concern for causing Alzheimer's/dementia    Statins Swelling    Leg swelling   Zestril [Lisinopril] Swelling    Leg swelling    ROS denies any nausea, vomit, fever, chills, chest pain, shortness of breath   Physical Exam: There were no vitals filed for this visit.  General: The patient is alert and oriented x3 in no acute distress.  Dermatology: Pedal skin atrophic.  Nailplates x 9 mycotic in appearance.  Lateral base of left fourth toe ulceration site appears callused over.  The area is nontender, no fluctuance, no periwound erythema, no drainage.  Callus noncompressible.  Appears stable and well adhered.  Vascular: DP and PT pulses left foot nonpalpable.  Capillary refill intact to the digits.  Decreased pedal hair growth.  Neurological: Light touch sensation grossly intact bilateral feet. Protective sensation decreased  Musculoskeletal Exam: Prior left foot partial fifth ray and fourth metatarsal resection.  Radiographic Exam: Left foot 3 views weightbearing Postsurgical changes from history of partial fifth ray resection, partial fourth  metatarsal head resection.  No osteolysis or cortical erosions to the base of the fourth toe proximal phalanx.  No signs of acute osteomyelitis.  Assessment/Plan of Care: 1. Ulcer of left foot with fat layer exposed (HCC)   2. Type 2 diabetes mellitus with diabetic peripheral angiopathy without gangrene, unspecified whether long term insulin  use (HCC)   3. 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  None  Discussed clinical findings with patient today.  Ulceration does appear healed at this point.  Recommend close monitoring and continuing to pad around the area with padded felt.  Discussed close monitoring for signs of recurrence, new ulceration or developing infection with patient.  Expressed good understanding.  He is agreeable to the plan.  Has had recent visit with vascular surgery on 01/22/24.  Patient educated on diabetes. Discussed proper diabetic foot care and discussed risks and complications of disease. Educated patient in depth on reasons to return to the office immediately should he/she discover anything concerning or new on the feet. All questions answered. Discussed proper shoes as well.    Follow-up in about 7 weeks for diabetic footcare.  Return sooner if concern for reulceration develops.   Laveyah Oriol L. Lamount MAUL, AACFAS [facility]  [address] [place], [place] [address]                Office [phone]  Fax [phone]

## 2024-03-18 NOTE — Patient Instructions (Signed)
 Look for urea 40% cream or ointment and apply to the thickened dry skin / calluses. This can be bought over the counter, at a pharmacy or online such as [employer].  More silicone and felt pads can be purchased from:  [email or web address]  Look for felt callus or horseshoe pads.  Can also findings on [employer].  Notify office if you experience any signs of the ulcer recurring including redness, swelling, drainage, pain.

## 2024-05-06 ENCOUNTER — Encounter: Payer: Self-pay | Admitting: Podiatry

## 2024-05-06 ENCOUNTER — Ambulatory Visit (INDEPENDENT_AMBULATORY_CARE_PROVIDER_SITE_OTHER): Admitting: Podiatry

## 2024-05-06 DIAGNOSIS — M79674 Pain in right toe(s): Secondary | ICD-10-CM | POA: Diagnosis not present

## 2024-05-06 DIAGNOSIS — B351 Tinea unguium: Secondary | ICD-10-CM | POA: Diagnosis not present

## 2024-05-06 DIAGNOSIS — E1151 Type 2 diabetes mellitus with diabetic peripheral angiopathy without gangrene: Secondary | ICD-10-CM

## 2024-05-06 DIAGNOSIS — M79675 Pain in left toe(s): Secondary | ICD-10-CM | POA: Diagnosis not present

## 2024-05-06 DIAGNOSIS — Z89422 Acquired absence of other left toe(s): Secondary | ICD-10-CM

## 2024-05-06 DIAGNOSIS — L97522 Non-pressure chronic ulcer of other part of left foot with fat layer exposed: Secondary | ICD-10-CM | POA: Diagnosis not present

## 2024-05-06 NOTE — Progress Notes (Unsigned)
 Chief Complaint  Patient presents with   [facility]    [facility] with callous A1c 7.9 in Aug No anti coag.     HPI: 58 y.o. male presenting today primarily for diabetic footcare.  Presents with painful, thickened, elongated dystrophic toenails.  Difficult for him to maintain nails due to their thickness and length.  History of prior left partial fifth ray amp, partial fourth metatarsal resection.  Last A1c 7.9.  Concern for reulceration of left lateral fourth toe base ulceration.  Past Medical History:  Diagnosis Date   Abscess of left foot 11/01/2022   Abscess of perineum 07/17/2018   Acute combined systolic and diastolic heart failure (HCC)    Acute systolic heart failure (HCC) 06/06/2021   Acute upper GI bleeding 11/23/2022   AKI (acute kidney injury) 05/08/2024   Anemia    Anemia, unspecified 12/14/2016   Atherosclerotic heart disease 09/07/2021   Cellulitis of scrotum 05/08/2024   Cervical radiculopathy 05/08/2024   Cervical spondylosis 05/08/2024   Chest pain 12/20/2016   CHF (congestive heart failure) (HCC)    Chronic systolic CHF (congestive heart failure) (HCC) 11/23/2022   COPD (chronic obstructive pulmonary disease) (HCC)    Coronary artery disease 05/08/2024   COVID-19 05/08/2024   Dark stools 11/25/2022   Diabetes mellitus, type 2 (HCC)    Diabetic foot ulcer (HCC) 05/08/2024   Dilated cardiomyopathy (HCC) 05/08/2024   Disorder of kidney and ureter, unspecified 05/22/2020   Dizziness 05/08/2024   Duodenal ulcer 11/06/2022   Elevated troponin I level 05/08/2024   Encntr for surgical aftcr following surgery on the circ sys 06/17/2021   Encounter for smoking cessation counseling 02/06/2023   Gastric ulcer    HFrEF (heart failure with reduced ejection fraction) (HCC) 12/09/2023   History of bleeding ulcers 03/28/2023   History of falling 05/22/2020   HLD (hyperlipidemia)    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/11/2021   Hyperglycemia 05/08/2024   Hyperkalemia 05/08/2024   Hyperlipidemia  associated with type 2 diabetes mellitus (HCC) 09/29/2022   Hypertension    Hypertension associated with diabetes (HCC) 09/29/2022   Hypertensive heart disease 05/08/2024   Hypertensive heart disease with congestive heart failure (HCC) 03/28/2023   Hypertensive urgency 05/08/2024   Hypomagnesemia 05/08/2024   Hypothyroidism 05/08/2024   Insomnia 03/28/2023   start trazodone as needed, sedative risk reviewed     Iron  deficiency anemia due to chronic blood loss 01/24/2023   Ischemic cardiomyopathy 05/08/2024   Kidney disease, chronic, stage III (GFR 30-59 ml/min) (HCC)    Left foot pain 05/08/2024   Left shoulder strain 05/08/2024   Lightheaded 05/08/2024   Long term (current) use of oral hypoglycemic drugs 05/22/2020   [place] grade D esophagitis 11/06/2022   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   Male erectile dysfunction, unspecified 05/17/2017   Marijuana use 12/09/2023   Melena 11/25/2022   Multiple duodenal ulcers    Nicotine  dependence, cigarettes, uncomplicated 05/22/2020   Nicotine  dependence, unspecified, uncomplicated 03/18/2018   Orthostatic hypotension    Osteoarthritis of hip 05/08/2024   Osteomyelitis (HCC)    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   Pelvic abscess in male [facility]) 05/08/2024   Peripheral arterial disease    Pneumonia 05/08/2024   Postural hypotension 12/20/2016   Presence of aortocoronary bypass graft 05/22/2020   PUD (peptic ulcer disease)    Pulmonary edema with congestive heart failure (HCC) 05/08/2024   Renal failure 05/08/2024   Right shoulder pain 05/08/2024   Sepsis (HCC) 11/11/2022   Status post coronary artery bypass grafting 05/08/2024  Strain of left hip 05/08/2024   Symptomatic anemia 11/25/2022   Syncope 12/20/2016   Tick bite 05/08/2024   Tobacco abuse    Type 2 diabetes mellitus with hyperglycemia (HCC) 07/09/2018   Past Surgical History:  Procedure Laterality Date   ABDOMINAL AORTOGRAM W/LOWER EXTREMITY N/A 11/03/2022    Procedure: ABDOMINAL AORTOGRAM W/LOWER EXTREMITY;  Surgeon: Gretta Lonni PARAS, MD;  Location: [facility];  Service: Cardiovascular;  Laterality: N/A;   AMPUTATION Left 10/31/2022   Procedure: AMPUTATION LEFT FIFTH TOE;  Surgeon: Joya Stabs, DPM;  Location: [facility] OR;  Service: Podiatry;  Laterality: Left;   AMPUTATION Left 11/15/2022   Procedure: AMPUTATION FITH RAY;  Surgeon: Janit Thresa HERO, DPM;  Location: [facility] OR;  Service: Podiatry;  Laterality: Left;   AMPUTATION TOE Left 10/31/2022   Procedure: IRRIGATION AND DEBRIDEMENTOF LEFT FOOT;  Surgeon: Joya Stabs, DPM;  Location: [facility] OR;  Service: Podiatry;  Laterality: Left;   BIOPSY  11/05/2022   Procedure: BIOPSY;  Surgeon: Rollin Dover, MD;  Location: [facility] ENDOSCOPY;  Service: Gastroenterology;;   CORONARY ARTERY BYPASS GRAFT N/A 06/10/2021   Procedure: CORONARY ARTERY BYPASS GRAFTING (CABG) TIMES FOUR, USING LEFT INTERNAL MAMMARY ARTERY AND RIGHT GREATER SAPHENOUS VEIN HARVESTED ENDOSCOPICALLY;  Surgeon: Obadiah Coy, MD;  Location: [facility] OR;  Service: Open Heart Surgery;  Laterality: N/A;   ENDOVEIN HARVEST OF GREATER SAPHENOUS VEIN Right 06/10/2021   Procedure: ENDOVEIN HARVEST OF GREATER SAPHENOUS VEIN;  Surgeon: Obadiah Coy, MD;  Location: [facility] OR;  Service: Open Heart Surgery;  Laterality: Right;   ESOPHAGOGASTRODUODENOSCOPY N/A 11/24/2022   Procedure: ESOPHAGOGASTRODUODENOSCOPY (EGD);  Surgeon: Wilhelmenia Aloha Raddle., MD;  Location: [facility] ENDOSCOPY;  Service: Gastroenterology;  Laterality: N/A;   ESOPHAGOGASTRODUODENOSCOPY (EGD) WITH PROPOFOL  N/A 11/05/2022   Procedure: ESOPHAGOGASTRODUODENOSCOPY (EGD) WITH PROPOFOL ;  Surgeon: Rollin Dover, MD;  Location: [facility] ENDOSCOPY;  Service: Gastroenterology;  Laterality: N/A;   HEMOSTASIS CLIP PLACEMENT  11/24/2022   Procedure: HEMOSTASIS CLIP PLACEMENT;  Surgeon: Wilhelmenia Aloha Raddle., MD;  Location: [facility] ENDOSCOPY;  Service: Gastroenterology;;   HEMOSTASIS CONTROL  11/24/2022   Procedure:  HEMOSTASIS CONTROL;  Surgeon: Wilhelmenia Aloha Raddle., MD;  Location: [facility] ENDOSCOPY;  Service: Gastroenterology;;   HOT HEMOSTASIS N/A 11/24/2022   Procedure: HOT HEMOSTASIS (ARGON PLASMA COAGULATION/BICAP);  Surgeon: Wilhelmenia Aloha Raddle., MD;  Location: [facility] ENDOSCOPY;  Service: Gastroenterology;  Laterality: N/A;   I & D EXTREMITY Left 11/08/2022   Procedure: IRRIGATION AND DEBRIDEMENT OF FOOT AND WASHOUT;  Surgeon: Gershon Donnice SAUNDERS, DPM;  Location: [facility] OR;  Service: Podiatry;  Laterality: Left;   PERIPHERAL VASCULAR BALLOON ANGIOPLASTY  11/03/2022   Procedure: PERIPHERAL VASCULAR BALLOON ANGIOPLASTY;  Surgeon: Gretta Lonni PARAS, MD;  Location: [facility];  Service: Cardiovascular;;   RIGHT/LEFT HEART CATH AND CORONARY ANGIOGRAPHY N/A 06/06/2021   Procedure: RIGHT/LEFT HEART CATH AND CORONARY ANGIOGRAPHY;  Surgeon: Cherrie Toribio SAUNDERS, MD;  Location: [facility];  Service: Cardiovascular;  Laterality: N/A;   TEE WITHOUT CARDIOVERSION N/A 06/10/2021   Procedure: TRANSESOPHAGEAL ECHOCARDIOGRAM (TEE);  Surgeon: Obadiah Coy, MD;  Location: [facility] OR;  Service: Open Heart Surgery;  Laterality: N/A;   Allergies[1]    PHYSICAL EXAM: There were no vitals filed for this visit.  General: The patient is alert and oriented x3 in no acute distress.  Dermatology: Pedal skin atrophic.  Interspaces clear and dry.  Nail plates x 9 are thickened greater than 3 mm, dystrophic, elongated with yellow discoloration subungual debris.  Brittle appearance.  They are tender on direct dorsal palpation  x 9.  Loosely adhered significant hyperkeratotic corn left fourth toe base as described below with underlying ulceration.    Wound 1:  Location: Base of left lateral fourth toe        Depth: Subcutaneous tissue        Wound Border: Hyperkeratotic        Wound Base: Fibrogranular        Drainage: Scant serous       Odor?:  No        Surrounding Tissue: Hyperkeratotic        Infected?:  Not erythematous,  stable appearing        Necrosis?:  Loss of skin subcutaneous tissue        Pain?:  No        Tunneling: No       Dimensions (cm): Overlying hyperkeratotic callus limiting predebridement measurements, postdebridement measurement 0.4 x 0.3 x 0.3 cm.    Vascular: DP and PT pulses nonpalpable bilaterally.  Capillary refill 3 to 5 seconds to the digits.  Decreased pedal hair growth.  Neurological: Light touch sensation decreased to digits.  Protective sensation decreased  Musculoskeletal Exam: Prior left partial fifth ray and fourth metatarsal resection.     Latest Ref Rng & Units 12/09/2023    4:24 AM  Hemoglobin A1C  Hemoglobin-A1c 4.8 - 5.6 % 7.9       ASSESSMENT / PLAN OF CARE: 1. Ulcer of left foot with fat layer exposed (HCC)   2. Type 2 diabetes mellitus with diabetic peripheral angiopathy without gangrene, unspecified whether long term insulin  use (HCC)   3. 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 Pain due to onychomycosis of toenails of both feet      No orders of the defined types were placed in this encounter.  None   # Left foot ulceration of left fourth toe with fat layer exposed The ulceration was sharply debrided of hyperkeratotic and devitalized soft tissue with sterile #15 blade to the level of subcutaneous tissue.  Hemostasis obtained.  Antibiotic ointment and DSD applied.  Reviewed off-loading with patient.  1 cc blood loss noted.  Offloading felt padding applied.  Reviewed daily dressing changes with patient.  Continue home Silvadene.  Discussed risks / concerns regarding ulcer with patient and possible sequelae if left untreated.  Stressed importance of infection prevention at home. Short-term goals are: prevent infection, off-load ulcer, heal ulcer Long-term goals are:  prevent recurrence, prevent amputation.   #Onychomycosis with pain  -Nails palliatively debrided as below. -Educated on self-care - Prior partial fifth ray and  fourth metatarsal resection, at risk footcare  Procedure: Nail Debridement Rationale: Pain Type of Debridement: manual, sharp debridement. Instrumentation: Nail nipper, rotary burr. Number of Nails: 9  # Diabetes with PAD Patient educated on diabetes. Discussed proper diabetic foot care and discussed risks and complications of disease. Educated patient in depth on reasons to return to the office immediately should he/she discover anything concerning or new on the feet. All questions answered. Discussed proper shoes as well.     Return in about 3 weeks (around 05/27/2024) for Wound Care.   Keegan Ducey L. Lamount MAUL, AACFAS [facility]     [address]  [place], [place] [address]                Office [phone]  Fax [phone]       [1]  Allergies Allergen Reactions   Neurontin  [Gabapentin ] Other (See Comments)    Concern for causing Alzheimer's/dementia    Statins Swelling    Leg swelling   Zestril [Lisinopril] Swelling    Leg swelling

## 2024-05-06 NOTE — Patient Instructions (Signed)

## 2024-05-08 DIAGNOSIS — I160 Hypertensive urgency: Secondary | ICD-10-CM | POA: Insufficient documentation

## 2024-05-08 DIAGNOSIS — I420 Dilated cardiomyopathy: Secondary | ICD-10-CM | POA: Insufficient documentation

## 2024-05-08 DIAGNOSIS — M169 Osteoarthritis of hip, unspecified: Secondary | ICD-10-CM | POA: Insufficient documentation

## 2024-05-08 DIAGNOSIS — I509 Heart failure, unspecified: Secondary | ICD-10-CM | POA: Insufficient documentation

## 2024-05-08 DIAGNOSIS — E785 Hyperlipidemia, unspecified: Secondary | ICD-10-CM | POA: Insufficient documentation

## 2024-05-08 DIAGNOSIS — E860 Dehydration: Secondary | ICD-10-CM | POA: Insufficient documentation

## 2024-05-08 DIAGNOSIS — M47812 Spondylosis without myelopathy or radiculopathy, cervical region: Secondary | ICD-10-CM | POA: Insufficient documentation

## 2024-05-08 DIAGNOSIS — E11621 Type 2 diabetes mellitus with foot ulcer: Secondary | ICD-10-CM | POA: Insufficient documentation

## 2024-05-08 DIAGNOSIS — E039 Hypothyroidism, unspecified: Secondary | ICD-10-CM | POA: Insufficient documentation

## 2024-05-08 DIAGNOSIS — S46912A Strain of unspecified muscle, fascia and tendon at shoulder and upper arm level, left arm, initial encounter: Secondary | ICD-10-CM | POA: Insufficient documentation

## 2024-05-08 DIAGNOSIS — R739 Hyperglycemia, unspecified: Secondary | ICD-10-CM | POA: Insufficient documentation

## 2024-05-08 DIAGNOSIS — N179 Acute kidney failure, unspecified: Secondary | ICD-10-CM | POA: Insufficient documentation

## 2024-05-08 DIAGNOSIS — N19 Unspecified kidney failure: Secondary | ICD-10-CM | POA: Insufficient documentation

## 2024-05-08 DIAGNOSIS — M79672 Pain in left foot: Secondary | ICD-10-CM | POA: Insufficient documentation

## 2024-05-08 DIAGNOSIS — U071 COVID-19: Secondary | ICD-10-CM | POA: Insufficient documentation

## 2024-05-08 DIAGNOSIS — R42 Dizziness and giddiness: Secondary | ICD-10-CM | POA: Insufficient documentation

## 2024-05-08 DIAGNOSIS — I501 Left ventricular failure: Secondary | ICD-10-CM | POA: Insufficient documentation

## 2024-05-08 DIAGNOSIS — I119 Hypertensive heart disease without heart failure: Secondary | ICD-10-CM | POA: Insufficient documentation

## 2024-05-08 DIAGNOSIS — R7989 Other specified abnormal findings of blood chemistry: Secondary | ICD-10-CM | POA: Insufficient documentation

## 2024-05-08 DIAGNOSIS — E875 Hyperkalemia: Secondary | ICD-10-CM | POA: Insufficient documentation

## 2024-05-08 DIAGNOSIS — I255 Ischemic cardiomyopathy: Secondary | ICD-10-CM | POA: Insufficient documentation

## 2024-05-08 DIAGNOSIS — I2510 Atherosclerotic heart disease of native coronary artery without angina pectoris: Secondary | ICD-10-CM | POA: Insufficient documentation

## 2024-05-08 DIAGNOSIS — K651 Peritoneal abscess: Secondary | ICD-10-CM | POA: Insufficient documentation

## 2024-05-08 DIAGNOSIS — I739 Peripheral vascular disease, unspecified: Secondary | ICD-10-CM | POA: Insufficient documentation

## 2024-05-08 DIAGNOSIS — N492 Inflammatory disorders of scrotum: Secondary | ICD-10-CM | POA: Insufficient documentation

## 2024-05-08 DIAGNOSIS — M5412 Radiculopathy, cervical region: Secondary | ICD-10-CM | POA: Insufficient documentation

## 2024-05-08 DIAGNOSIS — W57XXXA Bitten or stung by nonvenomous insect and other nonvenomous arthropods, initial encounter: Secondary | ICD-10-CM | POA: Insufficient documentation

## 2024-05-08 DIAGNOSIS — J189 Pneumonia, unspecified organism: Secondary | ICD-10-CM | POA: Insufficient documentation

## 2024-05-08 DIAGNOSIS — Z951 Presence of aortocoronary bypass graft: Secondary | ICD-10-CM | POA: Insufficient documentation

## 2024-05-08 DIAGNOSIS — D649 Anemia, unspecified: Secondary | ICD-10-CM | POA: Insufficient documentation

## 2024-05-08 DIAGNOSIS — M25511 Pain in right shoulder: Secondary | ICD-10-CM | POA: Insufficient documentation

## 2024-05-08 DIAGNOSIS — I5041 Acute combined systolic (congestive) and diastolic (congestive) heart failure: Secondary | ICD-10-CM | POA: Insufficient documentation

## 2024-05-08 DIAGNOSIS — K269 Duodenal ulcer, unspecified as acute or chronic, without hemorrhage or perforation: Secondary | ICD-10-CM | POA: Insufficient documentation

## 2024-05-08 DIAGNOSIS — S76012A Strain of muscle, fascia and tendon of left hip, initial encounter: Secondary | ICD-10-CM | POA: Insufficient documentation

## 2024-05-11 NOTE — Progress Notes (Unsigned)
 " Cardiology Office Note:    Date:  05/12/2024   ID:  Kenneth Bailey, [place] February 28, 1966, MRN [id number]  PCP:  Lonna Leonor HERO, NP  Cardiologist:  Redell Leiter, MD    Referring MD: Lonna Leonor HERO, NP    ASSESSMENT:    1. HFrEF (heart failure with reduced ejection fraction) (HCC)   2. Coronary artery disease involving native coronary artery of native heart without angina pectoris   3. S/P CABG x 4   4. Chronic kidney disease, unspecified CKD stage   5. Mixed hyperlipidemia   6. Hyperlipidemia LDL goal <55   7. Statin intolerance    PLAN:    In order of problems listed above:  Marked decrease in LV function relatively asymptomatic initiate guideline directed therapy Stable CAD no anginal discomfort I asked to be sure to take his aspirin  daily and continue his combined lipid-lowering treatment and Zetia  icosapent  ethyl Stable CKD will recheck renal function in 2 weeks after starting Entresto    Next appointment: 3 months   Medication Adjustments/Labs and Tests Ordered: Current medicines are reviewed at length with the patient today.  Concerns regarding medicines are outlined above.  No orders of the defined types were placed in this encounter.  No orders of the defined types were placed in this encounter.    History of Present Illness:    Kenneth Bailey is a 58 y.o. male with a hx of CAD with CABG January 2023 heart failure initially severely reduced ejection fraction subsequently improved 40 to 50% stage III CKD peripheral arterial disease hyperlipidemia cystic lesion of pancreas on CT scan iron  deficiency anemia and stage IV CKD last seen 04/02/2023.  With complaints of chest pain he underwent myocardial perfusion study after his last visit EF 40 to 45% with mild ischemia involving the basal portion of the inferior wall.  Echocardiogram July of this year showed EF of 30 to 35% with global hypokinesia.  He is admitted to the hospital in July with dizziness concern of  stroke was found to have severe vertebral artery stenosis bilaterally but no stroke occurred.  He was not seen by cardiology despite the fact that his EF was significantly reduced compared to baseline.  Compliance with diet, lifestyle and medications: Yes  Despite the severity of LV dysfunction he does not have exercise intolerance edema shortness of breath chest pain palpitation or syncope He was unaware of severe LV dysfunction To go ahead and start guideline directed treatment including SGLT2 inhibitor low-dose beta-blocker Entresto  he checks blood pressure daily outside of my chart so to drop off a list of blood pressures in 2 weeks recheck his renal function proBNP at that time hopefully we can uptitrate his Entresto  Past Medical History:  Diagnosis Date   Abscess of left foot 11/01/2022   Abscess of perineum 07/17/2018   Acute combined systolic and diastolic heart failure (HCC)    Acute systolic heart failure (HCC) 06/06/2021   Acute upper GI bleeding 11/23/2022   AKI (acute kidney injury) 05/08/2024   Anemia    Anemia, unspecified 12/14/2016   Atherosclerotic heart disease 09/07/2021   Cellulitis of scrotum 05/08/2024   Cervical radiculopathy 05/08/2024   Cervical spondylosis 05/08/2024   Chest pain 12/20/2016   CHF (congestive heart failure) (HCC)    Chronic systolic CHF (congestive heart failure) (HCC) 11/23/2022   COPD (chronic obstructive pulmonary disease) (HCC)    Coronary artery disease 05/08/2024   COVID-19 05/08/2024   Dark stools 11/25/2022   Diabetes mellitus, type  2 (HCC)    Diabetic foot ulcer (HCC) 05/08/2024   Dilated cardiomyopathy (HCC) 05/08/2024   Disorder of kidney and ureter, unspecified 05/22/2020   Dizziness 05/08/2024   Duodenal ulcer 11/06/2022   Elevated troponin I level 05/08/2024   Encntr for surgical aftcr following surgery on the circ sys 06/17/2021   Encounter for smoking cessation counseling 02/06/2023   Gastric ulcer    HFrEF (heart  failure with reduced ejection fraction) (HCC) 12/09/2023   History of bleeding ulcers 03/28/2023   History of falling 05/22/2020   HLD (hyperlipidemia)    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/11/2021   Hyperglycemia 05/08/2024   Hyperkalemia 05/08/2024   Hyperlipidemia associated with type 2 diabetes mellitus (HCC) 09/29/2022   Hypertension    Hypertension associated with diabetes (HCC) 09/29/2022   Hypertensive heart disease 05/08/2024   Hypertensive heart disease with congestive heart failure (HCC) 03/28/2023   Hypertensive urgency 05/08/2024   Hypomagnesemia 05/08/2024   Hypothyroidism 05/08/2024   Insomnia 03/28/2023   start trazodone as needed, sedative risk reviewed     Iron  deficiency anemia due to chronic blood loss 01/24/2023   Ischemic cardiomyopathy 05/08/2024   Kidney disease, chronic, stage III (GFR 30-59 ml/min) (HCC)    Left foot pain 05/08/2024   Left shoulder strain 05/08/2024   Lightheaded 05/08/2024   Long term (current) use of oral hypoglycemic drugs 05/22/2020   [place] grade D esophagitis 11/06/2022   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   Male erectile dysfunction, unspecified 05/17/2017   Marijuana use 12/09/2023   Melena 11/25/2022   Multiple duodenal ulcers    Nicotine  dependence, cigarettes, uncomplicated 05/22/2020   Nicotine  dependence, unspecified, uncomplicated 03/18/2018   Orthostatic hypotension    Osteoarthritis of hip 05/08/2024   Osteomyelitis (HCC)    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   Pelvic abscess in male [facility]) 05/08/2024   Peripheral arterial disease    Pneumonia 05/08/2024   Postural hypotension 12/20/2016   Presence of aortocoronary bypass graft 05/22/2020   PUD (peptic ulcer disease)    Pulmonary edema with congestive heart failure (HCC) 05/08/2024   Renal failure 05/08/2024   Right shoulder pain 05/08/2024   Sepsis (HCC) 11/11/2022   Status post coronary artery bypass grafting 05/08/2024   Strain of left hip 05/08/2024   Symptomatic anemia  11/25/2022   Syncope 12/20/2016   Tick bite 05/08/2024   Tobacco abuse    Type 2 diabetes mellitus with hyperglycemia (HCC) 07/09/2018    Current Medications: Active Medications[1]    EKGs/Labs/Other Studies Reviewed:    The following studies were reviewed today:  Cardiac Studies & Procedures   ______________________________________________________________________________________________ CARDIAC CATHETERIZATION  CARDIAC CATHETERIZATION 06/06/2021  Conclusion   Ost LAD to Prox LAD lesion is 90% stenosed.   Ramus lesion is 90% stenosed.   Prox RCA lesion is 95% stenosed.   Prox RCA to Mid RCA lesion is 80% stenosed.   Dist RCA lesion is 50% stenosed.   RPDA lesion is 90% stenosed.   Mid LM to Dist LM lesion is 50% stenosed.   Ost Cx to Prox Cx lesion is 40% stenosed.   1st Mrg lesion is 99% stenosed.   Mid Cx lesion is 50% stenosed.   2nd Diag lesion is 90% stenosed.   Mid LAD lesion is 70% stenosed.   Dist LAD lesion is 50% stenosed.   The left ventricular ejection fraction is 25-35% by visual estimate.  Findings:  Ao = 99/62 (84) LV = 107/11 RA =  5 RV = 30/6 PA = 29/9 (19) PCW =  12 Fick cardiac output/index = 4.7/2.5 PVR = 1.5 WU FA sat = 97% PA sat = 69%, 71% SVC sat = 69%  Assessment: Severe 3v CAD Ischemic CM EF ~25-30% Well compensated filling pressures  Plan/Discussion:  Will need CABG evaluation.  Toribio Fuel, MD 1:11 PM  Findings Coronary Findings Diagnostic  Dominance: Right  Left Main Mid LM to Dist LM lesion is 50% stenosed.  Left Anterior Descending Ost LAD to Prox LAD lesion is 90% stenosed. Mid LAD lesion is 70% stenosed. Dist LAD lesion is 50% stenosed.  Second Diagonal Branch 2nd Diag lesion is 90% stenosed.  Ramus Intermedius Ramus lesion is 90% stenosed.  Left Circumflex Ost Cx to Prox Cx lesion is 40% stenosed. Mid Cx lesion is 50% stenosed.  First Obtuse Marginal Branch 1st Mrg lesion is 99%  stenosed.  Right Coronary Artery Prox RCA lesion is 95% stenosed. Prox RCA to Mid RCA lesion is 80% stenosed. Dist RCA lesion is 50% stenosed.  Right Posterior Descending Artery RPDA lesion is 90% stenosed.  Intervention  No interventions have been documented.   STRESS TESTS  MYOCARDIAL PERFUSION IMAGING 04/05/2023  Interpretation Summary   Findings are consistent with mild ischemia involving basal portion of the inferior wall. The study is intermediate risk secondary to diminished EF.   No ST deviation was noted.   Left ventricular function is abnormal. Global function is moderately reduced. Nuclear stress EF: 37%. The left ventricular ejection fraction is moderately decreased (30-44%). End diastolic cavity size is normal.   Prior study not available for comparison.   ECHOCARDIOGRAM  ECHOCARDIOGRAM COMPLETE BUBBLE STUDY 12/08/2023  Narrative ECHOCARDIOGRAM REPORT    Patient Name:   KEGAN MCKEITHAN [facility] Date of Exam: 12/08/2023 Medical Rec #:  [id number]           Height:       68.0 in Accession #:    [id number]          Weight:       178.6 lb Date of Birth:  [birth year]            BSA:          1.948 m Patient Age:    58 years            BP:           148/89 mmHg Patient Gender: M                   HR:           77 bpm. Exam Location:  Inpatient  Procedure: 2D Echo, Cardiac Doppler, Color Doppler and Saline Contrast Bubble Study (Both Spectral and Color Flow Doppler were utilized during procedure).  Indications:    TIA 435.9 / G45.9  History:        Patient has prior history of Echocardiogram examinations, most recent 05/08/2023. CHF, Prior CABG, TIA, COPD and CKD, Signs/Symptoms:Hypotension and Syncope; Risk Factors:Hypertension, Diabetes, Dyslipidemia and Current Smoker.  Sonographer:    Thea Norlander RCS Referring Phys: [id number] JULIE MACHEN  IMPRESSIONS   1. No left ventricular thrombus is seen, but Definity contrast was not used. Left ventricular ejection  fraction, by estimation, is 30 to 35%. The left ventricle has moderately decreased function. The left ventricle demonstrates global hypokinesis. The left ventricular internal cavity size was mildly dilated. Left ventricular diastolic parameters are consistent with Grade I diastolic dysfunction (impaired relaxation). 2. Right ventricular systolic function is normal. The right ventricular size is normal. Tricuspid regurgitation signal is  inadequate for assessing PA pressure. 3. Left atrial size was mildly dilated. 4. The mitral valve is normal in structure. Trivial mitral valve regurgitation. No evidence of mitral stenosis. 5. The aortic valve is tricuspid. There is mild calcification of the aortic valve. There is mild thickening of the aortic valve. Aortic valve regurgitation is mild. Aortic valve sclerosis/calcification is present, without any evidence of aortic stenosis. 6. The inferior vena cava is normal in size with greater than 50% respiratory variability, suggesting right atrial pressure of 3 mmHg. 7. Agitated saline contrast bubble study was positive with shunting observed within 3-6 cardiac cycles suggestive of interatrial shunt.  Comparison(s): Prior images reviewed side by side. The left ventricular function is significantly worse. (but note that LV systolic function was overestimated on the 05/08/2023 study; on current review LVEF was approximately 40-45% with global LV hypokinesis on that study).  FINDINGS Left Ventricle: No left ventricular thrombus is seen, but Definity contrast was not used. Left ventricular ejection fraction, by estimation, is 30 to 35%. The left ventricle has moderately decreased function. The left ventricle demonstrates global hypokinesis. The left ventricular internal cavity size was mildly dilated. There is no left ventricular hypertrophy. Abnormal (paradoxical) septal motion consistent with post-operative status. Left ventricular diastolic parameters are  consistent with Grade I diastolic dysfunction (impaired relaxation). Normal left ventricular filling pressure.  Right Ventricle: The right ventricular size is normal. No increase in right ventricular wall thickness. Right ventricular systolic function is normal. Tricuspid regurgitation signal is inadequate for assessing PA pressure.  Left Atrium: Left atrial size was mildly dilated.  Right Atrium: Right atrial size was normal in size.  Pericardium: There is no evidence of pericardial effusion.  Mitral Valve: The mitral valve is normal in structure. Trivial mitral valve regurgitation, with centrally-directed jet. No evidence of mitral valve stenosis.  Tricuspid Valve: The tricuspid valve is normal in structure. Tricuspid valve regurgitation is not demonstrated.  Aortic Valve: The aortic valve is tricuspid. There is mild calcification of the aortic valve. There is mild thickening of the aortic valve. Aortic valve regurgitation is mild. Aortic valve sclerosis/calcification is present, without any evidence of aortic stenosis. Aortic valve peak gradient measures 9.5 mmHg.  Pulmonic Valve: The pulmonic valve was normal in structure. Pulmonic valve regurgitation is not visualized. No evidence of pulmonic stenosis.  Aorta: The aortic root and ascending aorta are structurally normal, with no evidence of dilitation.  Venous: The inferior vena cava is normal in size with greater than 50% respiratory variability, suggesting right atrial pressure of 3 mmHg.  IAS/Shunts: No atrial level shunt detected by color flow Doppler. Agitated saline contrast was given intravenously to evaluate for intracardiac shunting. Agitated saline contrast bubble study was positive with shunting observed within 3-6 cardiac cycles suggestive of interatrial shunt.   LEFT VENTRICLE PLAX 2D LVIDd:         5.60 cm      Diastology LVIDs:         4.80 cm      LV e' medial:    5.98 cm/s LV PW:         1.20 cm      LV E/e'  medial:  11.8 LV IVS:        0.70 cm      LV e' lateral:   8.92 cm/s LVOT diam:     2.00 cm      LV E/e' lateral: 7.9 LV SV:         65 LV SV Index:  34 LVOT Area:     3.14 cm  LV Volumes (MOD) LV vol d, MOD A2C: 83.8 ml LV vol d, MOD A4C: 155.0 ml LV vol s, MOD A2C: 59.4 ml LV vol s, MOD A4C: 115.0 ml LV SV MOD A2C:     24.4 ml LV SV MOD A4C:     155.0 ml LV SV MOD BP:      32.9 ml  RIGHT VENTRICLE            IVC RV S prime:     9.03 cm/s  IVC diam: 2.00 cm TAPSE (M-mode): 1.3 cm  LEFT ATRIUM             Index        RIGHT ATRIUM          Index LA diam:        4.40 cm 2.26 cm/m   RA Area:     9.85 cm LA Vol (A2C):   44.9 ml 23.05 ml/m  RA Volume:   17.90 ml 9.19 ml/m LA Vol (A4C):   43.7 ml 22.44 ml/m LA Biplane Vol: 44.7 ml 22.95 ml/m AORTIC VALVE AV Area (Vmax): 2.33 cm AV Vmax:        154.00 cm/s AV Peak Grad:   9.5 mmHg LVOT Vmax:      114.00 cm/s LVOT Vmean:     75.200 cm/s LVOT VTI:       0.208 m  AORTA Ao Root diam: 3.20 cm Ao Asc diam:  3.20 cm  MITRAL VALVE MV Area (PHT): 5.97 cm     SHUNTS MV Decel Time: 127 msec     Systemic VTI:  0.21 m MV E velocity: 70.70 cm/s   Systemic Diam: 2.00 cm MV A velocity: 119.00 cm/s MV E/A ratio:  0.59  Mihai Croitoru MD Electronically signed by Jerel Balding MD Signature Date/Time: 12/08/2023/1:41:44 PM    Final   TEE  ECHO INTRAOPERATIVE TEE 06/10/2021  Narrative *INTRAOPERATIVE TRANSESOPHAGEAL REPORT *    Patient Name:   DAMANTE SPRAGG [facility] Date of Exam: 06/10/2021 Medical Rec #:  [id number]           Height:       68.0 in Accession #:    [id number]          Weight:       166.0 lb Date of Birth:  03-Aug-1965            BSA:          1.89 m Patient Age:    58 years            BP:           143/88 mmHg Patient Gender: M                   HR:           72 bpm. Exam Location:  Anesthesiology  Transesophogeal exam was perform intraoperatively during surgical procedure. Patient was closely monitored  under general anesthesia during the entirety of examination.  Indications:     Coronary Artery Disease Sonographer:     Lauraine Pilot RDCS Performing Phys: [id number]T Diagnosing Phys: Norleen Pope MD  Complications: No known complications during this procedure. POST-OP IMPRESSIONS _ Left Ventricle: The left ventricle is unchanged from pre-bypass. _ Right Ventricle: The right ventricle appears unchanged from pre-bypass. _ Aorta: The aorta appears unchanged from pre-bypass. _ Left Atrial Appendage: The left atrial appendage appears unchanged from pre-bypass.  _ Aortic Valve: The aortic valve appears unchanged from pre-bypass. _ Mitral Valve: The mitral valve appears unchanged from pre-bypass. _ Tricuspid Valve: The tricuspid valve appears unchanged from pre-bypass. _ Pulmonic Valve: The pulmonic valve appears unchanged from pre-bypass.  PRE-OP FINDINGS Left Ventricle: The left ventricle has severely reduced systolic function, with an ejection fraction of 20-30%. The cavity size was moderately dilated.   Right Ventricle: The right ventricle has normal systolic function. The cavity was normal. There is no increase in right ventricular wall thickness.  Left Atrium: Left atrial size was normal in size. No left atrial/left atrial appendage thrombus was detected.  Right Atrium: Right atrial size was normal in size. Moble, filamentous structure noted arrising from IVC and into Right atrium. Discussed wtih Dr. Obadiah.  Interatrial Septum: Evidence of atrial level shunting detected by color flow Doppler. A small patent foramen ovale is detected with predominantly left to right shunting across the atrial septum.  Pericardium: There is no evidence of pericardial effusion.  Mitral Valve: The mitral valve is normal in structure. Mitral valve regurgitation is trivial by color flow Doppler. There is no evidence of mitral valve vegetation.  Tricuspid Valve: The tricuspid valve was  normal in structure. Tricuspid valve regurgitation is trivial by color flow Doppler.  Aortic Valve: The aortic valve is tricuspid Aortic valve regurgitation was not visualized by color flow Doppler. There is no stenosis of the aortic valve. There is no evidence of aortic valve vegetation.   Pulmonic Valve: The pulmonic valve was normal in structure. Pulmonic valve regurgitation is trivial by color flow Doppler.   Aorta: There is evidence of plaque in the descending aorta; Grade II, measuring 2-64mm in size.  +-------------+--------++ AORTIC VALVE          +-------------+--------++ AV Mean Grad:6.0 mmHg +-------------+--------++   Norleen Pope MD Electronically signed by Norleen Pope MD Signature Date/Time: 06/10/2021/3:40:03 PM    Final        ______________________________________________________________________________________________          Recent Labs: 12/09/2023: Magnesium  1.6 02/28/2024: ALT 17; BUN 24; Creatinine 1.36; Hemoglobin 14.3; Platelet Count 242; Potassium 4.4; Sodium 137  Recent Lipid Panel    Component Value Date/Time   CHOL 106 12/09/2023 0424   CHOL 171 04/02/2023 1410   TRIG 553 (H) 12/09/2023 0424   HDL 20 (L) 12/09/2023 0424   HDL 18 (L) 04/02/2023 1410   CHOLHDL 5.3 12/09/2023 0424   VLDL UNABLE TO CALCULATE IF TRIGLYCERIDE OVER 400 mg/dL 92/79/7974 9575   LDLCALC UNABLE TO CALCULATE IF TRIGLYCERIDE OVER 400 mg/dL 92/79/7974 9575   LDLCALC 58 04/02/2023 1410   LDLDIRECT 42 12/09/2023 0424    Physical Exam:    VS:  BP 130/70   Pulse 90   Ht 5' 8 (1.727 m)   Wt 179 lb (81.2 kg)   SpO2 98%   BMI 27.22 kg/m     Wt Readings from Last 3 Encounters:  05/12/24 179 lb (81.2 kg)  02/28/24 181 lb (82.1 kg)  01/22/24 176 lb 14.4 oz (80.2 kg)     GEN:  Well nourished, well developed in no acute distress HEENT: Normal NECK: No JVD; No carotid bruits LYMPHATICS: No lymphadenopathy CARDIAC: RRR, no murmurs, rubs,  gallops RESPIRATORY:  Clear to auscultation without rales, wheezing or rhonchi  ABDOMEN: Soft, non-tender, non-distended MUSCULOSKELETAL:  No edema; No deformity  SKIN: Warm and dry NEUROLOGIC:  Alert and oriented x 3 PSYCHIATRIC:  Normal affect    Signed, Redell Leiter, MD  05/12/2024  9:59 AM    [facility]      [1]  Current Meds  Medication Sig   acetaminophen  (TYLENOL ) 500 MG tablet Take 1,000 mg by mouth 2 (two) times daily as needed for moderate pain (pain score 4-6), fever or headache.   Evolocumab  (REPATHA  SURECLICK) 140 MG/ML SOAJ INJECT 140 MG INTO THE SKIN EVERY 14 (FOURTEEN) DAYS.   ezetimibe  (ZETIA ) 10 MG tablet Take 1 tablet (10 mg total) by mouth daily.   [Paused] furosemide  (LASIX ) 20 MG tablet Take 20 mg by mouth daily as needed for fluid or edema.   icosapent  Ethyl (VASCEPA ) 1 g capsule Take 2 capsules (2 g total) by mouth 2 (two) times daily.   Insulin  Syringe-Needle U-100 30G X 1/2 0.3 ML MISC Use with Novolog    LANTUS  SOLOSTAR 100 UNIT/ML Solostar Pen Inject 30 Units into the skin daily as needed (BS > 170).   metFORMIN (GLUCOPHAGE) 1000 MG tablet Take 1,000 mg by mouth 2 (two) times daily.   mirtazapine (REMERON) 15 MG tablet Take 15 mg by mouth at bedtime.   mupirocin  ointment (BACTROBAN ) 2 % Apply 1 Application topically 2 (two) times daily.   omeprazole (PRILOSEC) 40 MG capsule Take 40 mg by mouth at bedtime as needed (heartburn).   sildenafil (VIAGRA) 100 MG tablet Take 100 mg by mouth as needed.   traZODone (DESYREL) 50 MG tablet Take 100-150 mg by mouth at bedtime as needed for sleep.   "

## 2024-05-12 ENCOUNTER — Encounter: Payer: Self-pay | Admitting: Cardiology

## 2024-05-12 ENCOUNTER — Ambulatory Visit: Attending: Cardiology | Admitting: Cardiology

## 2024-05-12 VITALS — BP 130/70 | HR 90 | Ht 68.0 in | Wt 179.0 lb

## 2024-05-12 DIAGNOSIS — I2510 Atherosclerotic heart disease of native coronary artery without angina pectoris: Secondary | ICD-10-CM | POA: Diagnosis not present

## 2024-05-12 DIAGNOSIS — R0600 Dyspnea, unspecified: Secondary | ICD-10-CM | POA: Diagnosis not present

## 2024-05-12 DIAGNOSIS — N189 Chronic kidney disease, unspecified: Secondary | ICD-10-CM | POA: Diagnosis not present

## 2024-05-12 DIAGNOSIS — E782 Mixed hyperlipidemia: Secondary | ICD-10-CM | POA: Diagnosis not present

## 2024-05-12 DIAGNOSIS — I5020 Unspecified systolic (congestive) heart failure: Secondary | ICD-10-CM | POA: Diagnosis not present

## 2024-05-12 DIAGNOSIS — Z789 Other specified health status: Secondary | ICD-10-CM | POA: Diagnosis not present

## 2024-05-12 DIAGNOSIS — E785 Hyperlipidemia, unspecified: Secondary | ICD-10-CM | POA: Insufficient documentation

## 2024-05-12 DIAGNOSIS — Z951 Presence of aortocoronary bypass graft: Secondary | ICD-10-CM | POA: Diagnosis not present

## 2024-05-12 MED ORDER — EMPAGLIFLOZIN 10 MG PO TABS: 10.0000 mg | ORAL_TABLET | Freq: Every day | ORAL | 3 refills | Status: AC

## 2024-05-12 MED ORDER — SACUBITRIL-VALSARTAN 24-26 MG PO TABS: 1.0000 | ORAL_TABLET | Freq: Two times a day (BID) | ORAL | 3 refills | Status: AC

## 2024-05-12 MED ORDER — ASPIRIN 81 MG PO TBEC: 81.0000 mg | DELAYED_RELEASE_TABLET | Freq: Every day | ORAL | 3 refills | Status: AC

## 2024-05-12 MED ORDER — METOPROLOL SUCCINATE ER 25 MG PO TB24: 25.0000 mg | ORAL_TABLET | Freq: Every day | ORAL | 3 refills | Status: AC

## 2024-05-12 NOTE — Patient Instructions (Signed)
 Medication Instructions:  Your physician has recommended you make the following change in your medication:   START: Toprol  XL 25 mg daily START: Aspirin  81 mg daily START: Entresto  24 - 26 two times daily START: Jardiance  10 mg daily   *If you need a refill on your cardiac medications before your next appointment, please call your pharmacy*  Lab Work: Your physician recommends that you return for lab work in:   Labs in 2 weeks: BMP, Pro BNP  If you have labs (blood work) drawn today and your tests are completely normal, you will receive your results only by: MyChart Message (if you have MyChart) OR A paper copy in the mail If you have any lab test that is abnormal or we need to change your treatment, we will call you to review the results.  Testing/Procedures: None  Follow-Up: At [facility], you and your health needs are our priority.  As part of our continuing mission to provide you with exceptional heart care, our providers are all part of one team.  This team includes your primary Cardiologist (physician) and Advanced Practice Providers or APPs (Physician Assistants and Nurse Practitioners) who all work together to provide you with the care you need, when you need it.  Your next appointment:   3 month(s)  Provider:   Redell Leiter, MD    We recommend signing up for the patient portal called MyChart.  Sign up information is provided on this After Visit Summary.  MyChart is used to connect with patients for Virtual Visits (Telemedicine).  Patients are able to view lab/test results, encounter notes, upcoming appointments, etc.  Non-urgent messages can be sent to your provider as well.   To learn more about what you can do with MyChart, go to [email or web address].   Other Instructions Please keep a BP log for 2 weeks and send by MyChart or mail.                      Dr. Leiter [address] [place], [place] [address]  Blood Pressure Record Sheet To take your blood  pressure, you will need a blood pressure machine. You can buy a blood pressure machine (blood pressure monitor) at your clinic, drug store, or online. When choosing one, consider: An automatic monitor that has an arm cuff. A cuff that wraps snugly around your upper arm. You should be able to fit only one finger between your arm and the cuff. A device that stores blood pressure reading results. Do not choose a monitor that measures your blood pressure from your wrist or finger. Follow your health care provider's instructions for how to take your blood pressure. To use this form: Get one reading in the morning (a.m.) 1-2 hours after you take any medicines. Get one reading in the evening (p.m.) before supper.   Blood pressure log Date: _______________________  a.m. _____________________(1st reading) HR___________            p.m. _____________________(2nd reading) HR__________  Date: _______________________  a.m. _____________________(1st reading) HR___________            p.m. _____________________(2nd reading) HR__________  Date: _______________________  a.m. _____________________(1st reading) HR___________            p.m. _____________________(2nd reading) HR__________  Date: _______________________  a.m. _____________________(1st reading) HR___________            p.m. _____________________(2nd reading) HR__________  Date: _______________________  a.m. _____________________(1st reading) HR___________  p.m. _____________________(2nd reading) HR__________  Date: _______________________  a.m. _____________________(1st reading) HR___________            p.m. _____________________(2nd reading) HR__________  Date: _______________________  a.m. _____________________(1st reading) HR___________            p.m. _____________________(2nd reading) HR__________   This information is not intended to replace advice given to you by your health care provider. Make sure you  discuss any questions you have with your health care provider. Document Revised: 08/27/2019 Document Reviewed: 08/27/2019 Elsevier Patient Education  2021 [employer].

## 2024-05-14 ENCOUNTER — Telehealth: Payer: Self-pay

## 2024-05-14 NOTE — Telephone Encounter (Signed)
 [facility] needs Prior Authorization for Jardiance  10 mg. Thank you.

## 2024-05-19 ENCOUNTER — Other Ambulatory Visit (HOSPITAL_COMMUNITY): Payer: Self-pay

## 2024-05-19 NOTE — Telephone Encounter (Signed)
 Pharmacy Patient Advocate Encounter   Received notification from Physician's Office that prior authorization for JARDIANCE  is required/requested.   Insurance verification completed.   The patient is insured through [facility] MEDICAID.   Per test claim: PA required; PA submitted to above mentioned insurance via Latent Key/confirmation #/[id number] [facility] Status is pending

## 2024-05-20 ENCOUNTER — Other Ambulatory Visit (HOSPITAL_BASED_OUTPATIENT_CLINIC_OR_DEPARTMENT_OTHER): Payer: Self-pay | Admitting: Internal Medicine

## 2024-05-20 DIAGNOSIS — I2510 Atherosclerotic heart disease of native coronary artery without angina pectoris: Secondary | ICD-10-CM

## 2024-05-20 DIAGNOSIS — I739 Peripheral vascular disease, unspecified: Secondary | ICD-10-CM

## 2024-05-20 DIAGNOSIS — Z951 Presence of aortocoronary bypass graft: Secondary | ICD-10-CM

## 2024-05-20 DIAGNOSIS — E782 Mixed hyperlipidemia: Secondary | ICD-10-CM

## 2024-05-20 NOTE — Telephone Encounter (Signed)
 Pharmacy Patient Advocate Encounter  Received notification from [facility] [employer] that Prior Authorization for JARDIANCE  has been APPROVED from 05/19/24 to 05/19/25

## 2024-05-27 ENCOUNTER — Encounter: Payer: Self-pay | Admitting: Podiatry

## 2024-05-27 ENCOUNTER — Ambulatory Visit (INDEPENDENT_AMBULATORY_CARE_PROVIDER_SITE_OTHER): Admitting: Podiatry

## 2024-05-27 DIAGNOSIS — Z89422 Acquired absence of other left toe(s): Secondary | ICD-10-CM | POA: Diagnosis not present

## 2024-05-27 DIAGNOSIS — L97522 Non-pressure chronic ulcer of other part of left foot with fat layer exposed: Secondary | ICD-10-CM

## 2024-05-27 DIAGNOSIS — E1151 Type 2 diabetes mellitus with diabetic peripheral angiopathy without gangrene: Secondary | ICD-10-CM | POA: Diagnosis not present

## 2024-05-27 NOTE — Progress Notes (Signed)
 "   Chief Complaint  Patient presents with   Wound Check    Left wound check, closed and looks good. Left 4th, medial side has a spot inner-digitally that looks like a blister, but it may have busted and dried. Its black.     HPI: 59 y.o. male presenting today following up for wound check of lateral aspect of left fourth toe.  Patient is diabetic with elevated A1c.  He does have history of PAD with intervention.  He states that he believes the ulcer is doing well and appears callused over.  Upon initial examination of the after mentioned blistering nurse note, this does appear to be interdigital debris.  Past Medical History:  Diagnosis Date   Abscess of left foot 11/01/2022   Abscess of perineum 07/17/2018   Acute combined systolic and diastolic heart failure (HCC)    Acute systolic heart failure (HCC) 06/06/2021   Acute upper GI bleeding 11/23/2022   AKI (acute kidney injury) 05/08/2024   Anemia    Anemia, unspecified 12/14/2016   Atherosclerotic heart disease 09/07/2021   Cellulitis of scrotum 05/08/2024   Cervical radiculopathy 05/08/2024   Cervical spondylosis 05/08/2024   Chest pain 12/20/2016   CHF (congestive heart failure) (HCC)    Chronic systolic CHF (congestive heart failure) (HCC) 11/23/2022   COPD (chronic obstructive pulmonary disease) (HCC)    Coronary artery disease 05/08/2024   COVID-19 05/08/2024   Dark stools 11/25/2022   Diabetes mellitus, type 2 (HCC)    Diabetic foot ulcer (HCC) 05/08/2024   Dilated cardiomyopathy (HCC) 05/08/2024   Disorder of kidney and ureter, unspecified 05/22/2020   Dizziness 05/08/2024   Duodenal ulcer 11/06/2022   Elevated troponin I level 05/08/2024   Encntr for surgical aftcr following surgery on the circ sys 06/17/2021   Encounter for smoking cessation counseling 02/06/2023   Gastric ulcer    HFrEF (heart failure with reduced ejection fraction) (HCC) 12/09/2023   History of bleeding ulcers 03/28/2023   History of falling  05/22/2020   HLD (hyperlipidemia)    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/11/2021   Hyperglycemia 05/08/2024   Hyperkalemia 05/08/2024   Hyperlipidemia associated with type 2 diabetes mellitus (HCC) 09/29/2022   Hypertension    Hypertension associated with diabetes (HCC) 09/29/2022   Hypertensive heart disease 05/08/2024   Hypertensive heart disease with congestive heart failure (HCC) 03/28/2023   Hypertensive urgency 05/08/2024   Hypomagnesemia 05/08/2024   Hypothyroidism 05/08/2024   Insomnia 03/28/2023   start trazodone as needed, sedative risk reviewed     Iron  deficiency anemia due to chronic blood loss 01/24/2023   Ischemic cardiomyopathy 05/08/2024   Kidney disease, chronic, stage III (GFR 30-59 ml/min) (HCC)    Left foot pain 05/08/2024   Left shoulder strain 05/08/2024   Lightheaded 05/08/2024   Long term (current) use of oral hypoglycemic drugs 05/22/2020   [place] grade D esophagitis 11/06/2022   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   Male erectile dysfunction, unspecified 05/17/2017   Marijuana use 12/09/2023   Melena 11/25/2022   Multiple duodenal ulcers    Nicotine  dependence, cigarettes, uncomplicated 05/22/2020   Nicotine  dependence, unspecified, uncomplicated 03/18/2018   Orthostatic hypotension    Osteoarthritis of hip 05/08/2024   Osteomyelitis (HCC)    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   Pelvic abscess in male [facility]) 05/08/2024   Peripheral arterial disease    Pneumonia 05/08/2024   Postural hypotension 12/20/2016   Presence of aortocoronary bypass graft 05/22/2020   PUD (peptic ulcer disease)    Pulmonary edema with congestive  heart failure (HCC) 05/08/2024   Renal failure 05/08/2024   Right shoulder pain 05/08/2024   Sepsis (HCC) 11/11/2022   Status post coronary artery bypass grafting 05/08/2024   Strain of left hip 05/08/2024   Symptomatic anemia 11/25/2022   Syncope 12/20/2016   Tick bite 05/08/2024   Tobacco abuse    Type 2 diabetes mellitus with hyperglycemia  (HCC) 07/09/2018   Past Surgical History:  Procedure Laterality Date   ABDOMINAL AORTOGRAM W/LOWER EXTREMITY N/A 11/03/2022   Procedure: ABDOMINAL AORTOGRAM W/LOWER EXTREMITY;  Surgeon: Gretta Lonni PARAS, MD;  Location: [facility]AS[facility];  Service: Cardiovascular;  Laterality: N/A;   AMPUTATION Left 10/31/2022   Procedure: AMPUTATION LEFT FIFTH TOE;  Surgeon: Joya Stabs, DPM;  Location: [facility] OR;  Service: Podiatry;  Laterality: Left;   AMPUTATION Left 11/15/2022   Procedure: AMPUTATION FITH RAY;  Surgeon: Janit Thresa HERO, DPM;  Location: [facility] OR;  Service: Podiatry;  Laterality: Left;   AMPUTATION TOE Left 10/31/2022   Procedure: IRRIGATION AND DEBRIDEMENTOF LEFT FOOT;  Surgeon: Joya Stabs, DPM;  Location: [facility] OR;  Service: Podiatry;  Laterality: Left;   BIOPSY  11/05/2022   Procedure: BIOPSY;  Surgeon: Rollin Dover, MD;  Location: [facility] ENDOSCOPY;  Service: Gastroenterology;;   CORONARY ARTERY BYPASS GRAFT N/A 06/10/2021   Procedure: CORONARY ARTERY BYPASS GRAFTING (CABG) TIMES FOUR, USING LEFT INTERNAL MAMMARY ARTERY AND RIGHT GREATER SAPHENOUS VEIN HARVESTED ENDOSCOPICALLY;  Surgeon: Obadiah Coy, MD;  Location: [facility] OR;  Service: Open Heart Surgery;  Laterality: N/A;   ENDOVEIN HARVEST OF GREATER SAPHENOUS VEIN Right 06/10/2021   Procedure: ENDOVEIN HARVEST OF GREATER SAPHENOUS VEIN;  Surgeon: Obadiah Coy, MD;  Location: [facility] OR;  Service: Open Heart Surgery;  Laterality: Right;   ESOPHAGOGASTRODUODENOSCOPY N/A 11/24/2022   Procedure: ESOPHAGOGASTRODUODENOSCOPY (EGD);  Surgeon: Wilhelmenia Aloha Raddle., MD;  Location: [facility] ENDOSCOPY;  Service: Gastroenterology;  Laterality: N/A;   ESOPHAGOGASTRODUODENOSCOPY (EGD) WITH PROPOFOL  N/A 11/05/2022   Procedure: ESOPHAGOGASTRODUODENOSCOPY (EGD) WITH PROPOFOL ;  Surgeon: Rollin Dover, MD;  Location: [facility] ENDOSCOPY;  Service: Gastroenterology;  Laterality: N/A;   HEMOSTASIS CLIP PLACEMENT  11/24/2022   Procedure: HEMOSTASIS CLIP PLACEMENT;  Surgeon:  Wilhelmenia Aloha Raddle., MD;  Location: [facility] ENDOSCOPY;  Service: Gastroenterology;;   HEMOSTASIS CONTROL  11/24/2022   Procedure: HEMOSTASIS CONTROL;  Surgeon: Wilhelmenia Aloha Raddle., MD;  Location: [facility] ENDOSCOPY;  Service: Gastroenterology;;   HOT HEMOSTASIS N/A 11/24/2022   Procedure: HOT HEMOSTASIS (ARGON PLASMA COAGULATION/BICAP);  Surgeon: Wilhelmenia Aloha Raddle., MD;  Location: [facility] ENDOSCOPY;  Service: Gastroenterology;  Laterality: N/A;   I & D EXTREMITY Left 11/08/2022   Procedure: IRRIGATION AND DEBRIDEMENT OF FOOT AND WASHOUT;  Surgeon: Gershon Donnice SAUNDERS, DPM;  Location: [facility] OR;  Service: Podiatry;  Laterality: Left;   PERIPHERAL VASCULAR BALLOON ANGIOPLASTY  11/03/2022   Procedure: PERIPHERAL VASCULAR BALLOON ANGIOPLASTY;  Surgeon: Gretta Lonni PARAS, MD;  Location: [facility];  Service: Cardiovascular;;   RIGHT/LEFT HEART CATH AND CORONARY ANGIOGRAPHY N/A 06/06/2021   Procedure: RIGHT/LEFT HEART CATH AND CORONARY ANGIOGRAPHY;  Surgeon: Cherrie Toribio SAUNDERS, MD;  Location: [facility];  Service: Cardiovascular;  Laterality: N/A;   TEE WITHOUT CARDIOVERSION N/A 06/10/2021   Procedure: TRANSESOPHAGEAL ECHOCARDIOGRAM (TEE);  Surgeon: Obadiah Coy, MD;  Location: [facility] OR;  Service: Open Heart Surgery;  Laterality: N/A;   Allergies[1]    PHYSICAL EXAM: There were no vitals filed for this visit.  General: The patient is alert and oriented x3 in no acute distress.  Dermatology: Pedal skin atrophic.  Area of  ulceration left fourth toe base appears closed.  Some stable callus formation present.  Some debris and dead skin noted medial base of the left fourth toe without underlying ulceration.  Vascular: DP and PT pulses nonpalpable bilaterally.  Capillary refill 3 to 5 seconds to the digits.  Decreased pedal hair growth.  Neurological: Light touch sensation decreased to digits.  Protective sensation decreased  Musculoskeletal Exam: Prior left partial fifth ray and fourth  metatarsal resection.     Latest Ref Rng & Units 12/09/2023    4:24 AM  Hemoglobin A1C  Hemoglobin-A1c 4.8 - 5.6 % 7.9       ASSESSMENT / PLAN OF CARE: 1. Type 2 diabetes mellitus with diabetic peripheral angiopathy without gangrene, unspecified whether long term insulin  use (HCC)   2. 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 Ulcer of left foot with fat layer exposed (HCC)      No orders of the defined types were placed in this encounter.  None   Fortunately ulceration does appear healed at this point without any evidence of new skin breakdown.  Monitor the site closely for signs of recurrence.  Continue to pad and offload Recommend use of urea-based cream to soften callus  Of note patient relates that since his last visit he has quit smoking for about 3 weeks.  Patient has planned follow-up in March for diabetic footcare.  Follow-up as needed if symptoms recur or worsen or new complaints arise.  Patient educated on diabetes. Discussed proper diabetic foot care and discussed risks and complications of disease. Educated patient in depth on reasons to return to the office immediately should he/she discover anything concerning or new on the feet. All questions answered. Discussed proper shoes as well.    Return for Diabetic Foot Care.   Riko Lumsden L. Lamount MAUL, AACFAS [facility]     [address] [place], [place] [address]                Office [phone]  Fax [phone]         [1]  Allergies Allergen Reactions   Neurontin  [Gabapentin ] Other (See Comments)    Concern for causing Alzheimer's/dementia    Statins Swelling    Leg swelling   Zestril [Lisinopril] Swelling    Leg swelling   "

## 2024-08-05 ENCOUNTER — Ambulatory Visit: Admitting: Podiatry

## 2024-08-12 ENCOUNTER — Ambulatory Visit: Admitting: Cardiology
